# Patient Record
Sex: Male | Born: 1955 | Race: Black or African American | Hispanic: No | Smoking: Never smoker
Health system: Southern US, Community
[De-identification: ages and names within clinical notes are randomized; demographics above are authoritative.]

## PROBLEM LIST (undated history)

## (undated) DIAGNOSIS — N400 Enlarged prostate without lower urinary tract symptoms: Secondary | ICD-10-CM

## (undated) DIAGNOSIS — Z8639 Personal history of other endocrine, nutritional and metabolic disease: Secondary | ICD-10-CM

## (undated) DIAGNOSIS — I10 Essential (primary) hypertension: Secondary | ICD-10-CM

## (undated) DIAGNOSIS — R740 Nonspecific elevation of levels of transaminase and lactic acid dehydrogenase [LDH]: Secondary | ICD-10-CM

## (undated) DIAGNOSIS — Z125 Encounter for screening for malignant neoplasm of prostate: Secondary | ICD-10-CM

## (undated) DIAGNOSIS — E785 Hyperlipidemia, unspecified: Secondary | ICD-10-CM

## (undated) DIAGNOSIS — E291 Testicular hypofunction: Secondary | ICD-10-CM

## (undated) DIAGNOSIS — K117 Disturbances of salivary secretion: Secondary | ICD-10-CM

## (undated) DIAGNOSIS — I8390 Asymptomatic varicose veins of unspecified lower extremity: Secondary | ICD-10-CM

## (undated) DIAGNOSIS — R1310 Dysphagia, unspecified: Secondary | ICD-10-CM

## (undated) DIAGNOSIS — R0789 Other chest pain: Secondary | ICD-10-CM

## (undated) DIAGNOSIS — M1990 Unspecified osteoarthritis, unspecified site: Secondary | ICD-10-CM

## (undated) HISTORY — DX: Encounter for screening for malignant neoplasm of prostate: Z12.5

## (undated) HISTORY — DX: Dysphagia, unspecified: R13.10

## (undated) HISTORY — DX: Hyperlipidemia, unspecified: E78.5

## (undated) HISTORY — DX: Essential (primary) hypertension: I10

## (undated) HISTORY — DX: Disturbances of salivary secretion: K11.7

## (undated) HISTORY — DX: Asymptomatic varicose veins of unspecified lower extremity: I83.90

## (undated) HISTORY — DX: Nonspecific elevation of levels of transaminase and lactic acid dehydrogenase (ldh): R74.0

## (undated) HISTORY — DX: Testicular hypofunction: E29.1

## (undated) HISTORY — DX: Personal history of other endocrine, nutritional and metabolic disease: Z86.39

## (undated) HISTORY — DX: Benign prostatic hyperplasia without lower urinary tract symptoms: N40.0

## (undated) HISTORY — DX: Other chest pain: R07.89

## (undated) HISTORY — PX: COLONOSCOPY: SHX174

## (undated) HISTORY — PX: OTHER SURGICAL HISTORY: SHX169

---

## 2004-07-08 ENCOUNTER — Ambulatory Visit (HOSPITAL_COMMUNITY): Admission: RE | Admit: 2004-07-08 | Discharge: 2004-07-08 | Payer: Self-pay | Admitting: Surgery

## 2004-07-26 HISTORY — PX: HERNIA REPAIR: SHX51

## 2005-01-19 ENCOUNTER — Ambulatory Visit: Payer: Self-pay | Admitting: Internal Medicine

## 2005-02-03 ENCOUNTER — Ambulatory Visit: Payer: Self-pay | Admitting: Internal Medicine

## 2008-06-06 ENCOUNTER — Ambulatory Visit (HOSPITAL_COMMUNITY): Admission: RE | Admit: 2008-06-06 | Discharge: 2008-06-06 | Payer: Self-pay | Admitting: Otolaryngology

## 2008-11-19 ENCOUNTER — Encounter: Admission: RE | Admit: 2008-11-19 | Discharge: 2008-11-19 | Payer: Self-pay | Admitting: Otolaryngology

## 2011-10-15 ENCOUNTER — Ambulatory Visit: Payer: BC Managed Care – PPO

## 2011-10-15 ENCOUNTER — Ambulatory Visit (INDEPENDENT_AMBULATORY_CARE_PROVIDER_SITE_OTHER): Payer: BC Managed Care – PPO | Admitting: Family Medicine

## 2011-10-15 VITALS — BP 118/70 | HR 52 | Temp 97.6°F | Resp 16 | Ht 65.0 in | Wt 168.0 lb

## 2011-10-15 DIAGNOSIS — M25559 Pain in unspecified hip: Secondary | ICD-10-CM

## 2011-10-15 DIAGNOSIS — M76899 Other specified enthesopathies of unspecified lower limb, excluding foot: Secondary | ICD-10-CM

## 2011-10-15 DIAGNOSIS — M7060 Trochanteric bursitis, unspecified hip: Secondary | ICD-10-CM

## 2011-10-15 MED ORDER — METHYLPREDNISOLONE ACETATE 80 MG/ML IJ SUSP: 40.0000 mg | Freq: Once | INTRAMUSCULAR | Status: DC

## 2011-10-15 NOTE — Progress Notes (Signed)
  Patient Name: Edward Glass Date of Birth: March 04, 1956 Medical Record Number: [id number] Gender: male Date of Encounter: 10/15/2011  History of Present Illness:  Edward Glass is a 56 y.o. very pleasant male patient who presents with the following:  Has noted right hip pain for about 2 weeks.  Unsure if it might be related to exercise.  Insidious onset.  Doing squats is most painful.  He is a Systems analyst and exercises a lot.  Has had a similar problem in the past but it self resolved.   Generally healthy.  No history of diabetes  There is no problem list on file for this patient.  No past medical history on file. No past surgical history on file. History  Substance Use Topics  . Smoking status: Never Smoker   . Smokeless tobacco: Not on file  . Alcohol Use: Not on file   No family history on file. No Known Allergies  Medication list has been reviewed and updated.  Review of Systems: As per HPI- otherwise negative.Marland Kitchen  Physical Examination: Filed Vitals:   10/15/11 0902  BP: 118/70  Pulse: 52  Temp: 97.6 F (36.4 C)  TempSrc: Oral  Resp: 16  Height: 5\' 5"  (1.651 m)  Weight: 168 lb (76.204 kg)    Body mass index is 27.96 kg/(m^2).   GEN: WDWN, NAD, Non-toxic, Alert & Oriented x 3- fit build HEENT: Atraumatic, Normocephalic.  Ears and Nose: No external deformity. EXTR: No clubbing/cyanosis/edema NEURO: Normal gait.  PSYCH: Normally interactive. Conversant. Not depressed or anxious appearing.  Calm demeanor.  Right hip: tender over greater trochanter and surrounding muscles. No redness, heat or swelling.  Mild tenderness also over GT with hip movement.  Full leg strength and sensation [facility] reading (PRIMARY) by  Dr. Patsy Lager.  Normal right hip   Procedure note: explained suspected diagnosis of trochanteric bursitis to patinet.  He would like to try an injection.  Explained risk of infection, bleeding, pain, etc- he gave consent.  Located area of tenderness  over right greater trochanter.  Cleaned area with betadine X 2 , alcohol X3, sprayed with ethyl chloride for anesthesia and injected with 3cc of 1% lidocaine and 40mg  of depo- medrol.  He tolerated the procedure well, no complications Assessment and Plan: 1. Hip pain  DG Hip Complete Right, methylPREDNISolone acetate (DEPO-MEDROL) injection 40 mg  2. Trochanteric bursitis     Injected as above.  Let us know if not helpful- Sooner if worse.

## 2012-02-07 DIAGNOSIS — I8390 Asymptomatic varicose veins of unspecified lower extremity: Secondary | ICD-10-CM

## 2012-02-07 HISTORY — DX: Asymptomatic varicose veins of unspecified lower extremity: I83.90

## 2012-05-10 ENCOUNTER — Ambulatory Visit (INDEPENDENT_AMBULATORY_CARE_PROVIDER_SITE_OTHER): Payer: BC Managed Care – PPO | Admitting: Family Medicine

## 2012-05-10 DIAGNOSIS — Z23 Encounter for immunization: Secondary | ICD-10-CM

## 2013-05-09 ENCOUNTER — Ambulatory Visit (INDEPENDENT_AMBULATORY_CARE_PROVIDER_SITE_OTHER): Payer: BC Managed Care – PPO | Admitting: Family Medicine

## 2013-05-09 DIAGNOSIS — Z23 Encounter for immunization: Secondary | ICD-10-CM

## 2013-08-10 ENCOUNTER — Encounter: Payer: Self-pay | Admitting: Internal Medicine

## 2013-08-10 ENCOUNTER — Ambulatory Visit (INDEPENDENT_AMBULATORY_CARE_PROVIDER_SITE_OTHER): Payer: BC Managed Care – PPO | Admitting: Internal Medicine

## 2013-08-10 VITALS — BP 122/88 | HR 61 | Ht 65.0 in | Wt 168.0 lb

## 2013-08-10 DIAGNOSIS — R0789 Other chest pain: Secondary | ICD-10-CM

## 2013-08-10 DIAGNOSIS — Z79899 Other long term (current) drug therapy: Secondary | ICD-10-CM

## 2013-08-10 DIAGNOSIS — Z13220 Encounter for screening for lipoid disorders: Secondary | ICD-10-CM

## 2013-08-10 DIAGNOSIS — Z136 Encounter for screening for cardiovascular disorders: Secondary | ICD-10-CM

## 2013-08-10 HISTORY — DX: Other chest pain: R07.89

## 2013-08-10 NOTE — Progress Notes (Signed)
    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  He has a long-standing history of body building over 30 years and has been a steroid user in the past. He denies using that currently however based on some of the heart problems that he's trainees have, he thought that he should get evaluated for his heart. He does not have any history of known cardiac disease, diabetes, dyslipidemia or other risk factors. There is no significant heart disease in his family. As mentioned he did have a history of anabolic steroid use in the past. He continues to lift weights and does aerobic exercise 4-5 times a week, for almost 60 minutes without any limitation such as chest pain or shortness of breath.  He occasionally gets some twinging in the left chest which goes away after a few minutes.  This does not sound particularly cardiac.  PMHx:  History reviewed. No pertinent past medical history.  History reviewed. No pertinent past surgical history.  FAMHx:  No family history on file.  SOCHx:   reports that he has never smoked. He has never used smokeless tobacco. He reports that he does not drink alcohol or use illicit drugs.  ALLERGIES:  No Known Allergies  ROS: A comprehensive review of systems was negative except for: Cardiovascular: positive for chest pain  HOME MEDS: No current outpatient prescriptions on file.   Current Facility-Administered Medications  Medication Dose Route Frequency Provider Last Rate Last Dose  . methylPREDNISolone acetate (DEPO-MEDROL) injection 40 mg  40 mg Intra-articular Once Darreld Mclean, MD        LABS/IMAGING: No results found for this or any previous visit (from the past 48 hour(s)). No results found.  VITALS: BP 122/88  Pulse 61   Ht 5\' 5"  (1.651 m)  Wt 168 lb (76.204 kg)  BMI 27.96 kg/m2  EXAM: General appearance: alert and no distress, muscular, well-developed Neck: no carotid bruit and no JVD Lungs: clear to auscultation bilaterally Heart: regular rate and rhythm, S1, S2 normal, no murmur, click, rub or gallop Abdomen: soft, non-tender; bowel sounds normal; no masses,  no organomegaly Extremities: extremities normal, atraumatic, no cyanosis or edema Pulses: 2+ and symmetric Skin: Skin color, texture, turgor normal. No rashes or lesions Neurologic: Grossly normal Psych: Normal  EKG: Normal sinus rhythm, minimal criteria for LVH  ASSESSMENT: 1. Atypical chest pain 2. History of anabolic steroid use  PLAN: 1.   [name] did describe a small amount of atypical type chest pain. However he is able to exercise quite high metabolic rate without any symptoms. I did not leave any further functional testing is necessary. He is concerned about whether his prior anabolic steroid use and supplements may have put him at risk for cardiovascular disease.  I think he would be a good candidate for coronary calcium scoring given his age and intermediate risk.  I would also like to check a lipid profile. I can make further recommendations based on these findings.  Pixie Casino, MD, [facility] Attending Cardiologist [facility]  HILTY,Kenneth C 08/10/2013, 4:12 PM

## 2013-08-10 NOTE — Patient Instructions (Signed)
Dr. Debara Pickett has ordered a coronary calcium score - this is done at [facility] will help set this up.   Your physician recommends that you return for lab work in a few days to a week. You will need to be fasting.   Your physician recommends that you schedule a follow-up appointment in a few weeks.

## 2013-08-13 ENCOUNTER — Encounter: Payer: Self-pay | Admitting: Internal Medicine

## 2013-08-14 ENCOUNTER — Ambulatory Visit (INDEPENDENT_AMBULATORY_CARE_PROVIDER_SITE_OTHER)
Admission: RE | Admit: 2013-08-14 | Discharge: 2013-08-14 | Disposition: A | Discharge status: Home / Self Care | Payer: Self-pay | Source: Ambulatory Visit | Attending: Internal Medicine | Admitting: Internal Medicine

## 2013-08-14 DIAGNOSIS — Z136 Encounter for screening for cardiovascular disorders: Secondary | ICD-10-CM

## 2013-08-15 LAB — COMPREHENSIVE METABOLIC PANEL
ALT: 30 U/L (ref 0–53)
AST: 47 U/L — ABNORMAL HIGH (ref 0–37)
Albumin: 4.3 g/dL (ref 3.5–5.2)
Alkaline Phosphatase: 68 U/L (ref 39–117)
BUN: 26 mg/dL — ABNORMAL HIGH (ref 6–23)
CHLORIDE: 106 meq/L (ref 96–112)
CO2: 28 meq/L (ref 19–32)
Calcium: 9.4 mg/dL (ref 8.4–10.5)
Creat: 1.03 mg/dL (ref 0.50–1.35)
GLUCOSE: 82 mg/dL (ref 70–99)
Potassium: 4.6 mEq/L (ref 3.5–5.3)
SODIUM: 139 meq/L (ref 135–145)
TOTAL PROTEIN: 6.8 g/dL (ref 6.0–8.3)
Total Bilirubin: 0.7 mg/dL (ref 0.3–1.2)

## 2013-08-16 LAB — NMR LIPOPROFILE WITH LIPIDS
Cholesterol, Total: 159 mg/dL (ref <200)
HDL Particle Number: 34.4 umol/L (ref >=30.5)
HDL SIZE: 9.5 nm (ref >=9.2)
HDL-C: 63 mg/dL (ref >=40)
LARGE HDL: 13.3 umol/L (ref >=4.8)
LARGE VLDL-P: 2 nmol/L (ref <=2.7)
LDL (calc): 86 mg/dL (ref <100)
LDL Particle Number: 1063 nmol/L — ABNORMAL HIGH (ref <1000)
LDL Size: 20.8 nm (ref >20.5)
LP-IR SCORE: 29 (ref <=45)
SMALL LDL PARTICLE NUMBER: 416 nmol/L (ref <=527)
TRIGLYCERIDES: 52 mg/dL (ref <150)
VLDL SIZE: 42.5 nm (ref <=46.6)

## 2013-08-20 ENCOUNTER — Encounter: Payer: Self-pay | Admitting: *Deleted

## 2013-10-03 ENCOUNTER — Ambulatory Visit (INDEPENDENT_AMBULATORY_CARE_PROVIDER_SITE_OTHER): Payer: BC Managed Care – PPO | Admitting: Family Medicine

## 2013-10-03 VITALS — BP 116/70 | HR 58 | Temp 98.2°F | Resp 18 | Ht 65.0 in | Wt 167.0 lb

## 2013-10-03 DIAGNOSIS — N138 Other obstructive and reflux uropathy: Secondary | ICD-10-CM

## 2013-10-03 DIAGNOSIS — R300 Dysuria: Secondary | ICD-10-CM

## 2013-10-03 DIAGNOSIS — N401 Enlarged prostate with lower urinary tract symptoms: Secondary | ICD-10-CM

## 2013-10-03 LAB — POCT URINALYSIS DIPSTICK
Bilirubin, UA: NEGATIVE
Glucose, UA: NEGATIVE
Ketones, UA: NEGATIVE
Leukocytes, UA: NEGATIVE
Nitrite, UA: NEGATIVE
Protein, UA: NEGATIVE
Spec Grav, UA: 1.02
Urobilinogen, UA: 0.2
pH, UA: 5.5

## 2013-10-03 LAB — POCT UA - MICROSCOPIC ONLY
Bacteria, U Microscopic: NEGATIVE
Casts, Ur, LPF, POC: NEGATIVE
Crystals, Ur, HPF, POC: NEGATIVE
Epithelial cells, urine per micros: NEGATIVE
Mucus, UA: NEGATIVE
Yeast, UA: NEGATIVE

## 2013-10-03 LAB — IFOBT (OCCULT BLOOD): IFOBT: POSITIVE

## 2013-10-03 MED ORDER — AZITHROMYCIN 250 MG PO TABS: ORAL_TABLET | ORAL | Status: DC

## 2013-10-03 NOTE — Patient Instructions (Signed)
Take azithromycin 250 mg 4 pills at the same time today.  I will let you know in the next couple of days the results of your culture. If you do not hear from [place] by Saturday please call and see if that result is come back.  Drink plenty of fluids  If having more pain or discharge please return  I would advise that you consider seeing your gastroenterologist for your 10 year colonoscopy even though it has only been a little under 9 years. He did have a trace amount of blood detectable in the stool on the rectal exam. This could have been from your hemorrhoids, but we cannot know for certain.   The prostate gland was large in size but normal in feel. Please have your primary care physician check that he next physical exam.

## 2013-10-03 NOTE — Progress Notes (Signed)
Subjective: 58 year old man who presents with a history of about one week of urinary discomfort and burning. He thinks it has gradually gotten a little bit worse. He has not had urinary tract infection or STD problems in the past. He does have a history of having had multiple sexual partners. His last sexual encounter was earlier this week. He's not been having a fever. No nausea or vomiting. No diarrhea or constipation. No history of prostate problems. He had a physical exam last June and was told things were normal. He is a Insurance underwriter. His only medication is taking some viagra.   Objective: Healthy-appearing muscular man in no major acute distress. His abdomen is soft without masses or CVA tenderness. Normal male external genitalia. There is a tiny drop of urine in the tip of the urethra but no pus is noted. Urethral probe swab taken. Digital rectal exam was done. Prostate gland feels moderately large, soft, no masses.  Assessment:  Dysuria BPH  Plan: Urinalysis, culture of urethra,Hemasure Results for orders placed in visit on 10/03/13  POCT UA - MICROSCOPIC ONLY      Result Value Ref Range   WBC, Ur, HPF, POC 0-1     RBC, urine, microscopic 0-1     Bacteria, U Microscopic neg     Mucus, UA neg     Epithelial cells, urine per micros neg     Crystals, Ur, HPF, POC neg     Casts, Ur, LPF, POC neg     Yeast, UA neg    POCT URINALYSIS DIPSTICK      Result Value Ref Range   Color, UA yellow     Clarity, UA clear     Glucose, UA neg     Bilirubin, UA neg     Ketones, UA neg     Spec Grav, UA 1.020     Blood, UA tr-intact     pH, UA 5.5     Protein, UA neg     Urobilinogen, UA 0.2     Nitrite, UA neg     Leukocytes, UA Negative    IFOBT (OCCULT BLOOD)      Result Value Ref Range   IFOBT Positive     Azithromycin 1 gm po.  Rtc if not improving

## 2013-10-04 LAB — GC/CHLAMYDIA PROBE AMP
CT PROBE, AMP APTIMA: NEGATIVE
GC PROBE AMP APTIMA: NEGATIVE

## 2014-05-08 ENCOUNTER — Ambulatory Visit (INDEPENDENT_AMBULATORY_CARE_PROVIDER_SITE_OTHER): Payer: BC Managed Care – PPO | Admitting: Family Medicine

## 2014-05-08 DIAGNOSIS — Z23 Encounter for immunization: Secondary | ICD-10-CM

## 2014-07-01 ENCOUNTER — Ambulatory Visit (INDEPENDENT_AMBULATORY_CARE_PROVIDER_SITE_OTHER): Payer: BC Managed Care – PPO | Admitting: Family Medicine

## 2014-07-01 ENCOUNTER — Ambulatory Visit (INDEPENDENT_AMBULATORY_CARE_PROVIDER_SITE_OTHER): Payer: BC Managed Care – PPO

## 2014-07-01 VITALS — BP 122/72 | HR 59 | Temp 97.8°F | Resp 18 | Ht 64.25 in | Wt 170.0 lb

## 2014-07-01 DIAGNOSIS — M25551 Pain in right hip: Secondary | ICD-10-CM

## 2014-07-01 DIAGNOSIS — M7061 Trochanteric bursitis, right hip: Secondary | ICD-10-CM

## 2014-07-01 MED ORDER — TRIAMCINOLONE ACETONIDE 40 MG/ML IJ SUSP
40.0000 mg | Freq: Once | INTRAMUSCULAR | Status: AC
  Administered 2014-07-01: 40 mg via INTRAMUSCULAR

## 2014-07-01 NOTE — Progress Notes (Signed)
Chief Complaint:  Chief Complaint  Patient presents with  . Hip Pain    Right hip pain. 3-4 months. Started out as a sharp pain but is now a dull pain.     HPI: Edward Glass is a 58 y.o. male who is here for right hip pain for the last 3-4 months, he has had prior hip pain and injury. He has had great troch bursitis in the past and was given depomedrol and had relief  . He states that certain movements ie the elliptical would aggravate the paina and it would radiate around his hip joint. 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 He states he doe snot compete or do that any longer.  No prior hx of AVN  History reviewed. No pertinent past medical history. History reviewed. No pertinent past surgical history. History   Social History  . Marital Status: Married    Spouse Name: N/A    Number of Children: N/A  . Years of Education: N/A   Social History Main Topics  . Smoking status: Never Smoker   . Smokeless tobacco: Never Used  . Alcohol Use: No  . Drug Use: No  . Sexual Activity: None   Other Topics Concern  . None   Social History Narrative   Family History  Problem Relation Age of Onset  . Kidney failure Mother   . Cancer Mother   . Prostate cancer Father   . Cancer Father   . Diabetes Father    No Known Allergies Prior to Admission medications   Medication Sig Start Date End Date Taking? Authorizing Provider  aspirin 81 MG tablet Take 81 mg by mouth daily.    Historical Provider, MD     ROS: The patient denies fevers, chills, night sweats, unintentional weight loss, chest pain, palpitations, wheezing, dyspnea on exertion, nausea, vomiting, abdominal pain, dysuria, hematuria, melena, numbness, weakness, or tingling.  All other systems have been reviewed and were otherwise negative with the exception of those mentioned in the HPI and as above.    PHYSICAL EXAM: Filed Vitals:   07/01/14 1454    BP: 122/72  Pulse: 59  Temp: 97.8 F (36.6 C)  Resp: 18   Filed Vitals:   07/01/14 1454  Height: 5' 4.25" (1.632 m)  Weight: 170 lb (77.111 kg)   Body mass index is 28.95 kg/(m^2).  General: Alert, no acute distress HEENT:  Normocephalic, atraumatic, oropharynx patent. EOMI, PERRLA Cardiovascular:  Regular rate and rhythm, no rubs murmurs or gallops.  No Carotid bruits, radial pulse intact. No pedal edema.  Respiratory: Clear to auscultation bilaterally.  No wheezes, rales, or rhonchi.  No cyanosis, no use of accessory musculature GI: No organomegaly, abdomen is soft and non-tender, positive bowel sounds.  No masses. Skin: No rashes. Neurologic: Facial musculature symmetric. Psychiatric: Patient is appropriate throughout our interaction. Lymphatic: No cervical lymphadenopathy Musculoskeletal: Gait intact. lumbar spine normal Right hip-tender at greater troch 5/5 strength, 2/2 DTRs Neg straight leg No saddle anesthesia   LABS:  EKG/XRAY:   Primary read interpreted by Dr. Marin Comment at [facility]. Neg for fx or dislocation   ASSESSMENT/PLAN: Encounter Diagnoses  Name Primary?  . Right hip pain Yes  . Trochanteric bursitis of right hip    No skin erythema or wound noted. Risks (including but not limited to bleeding and infection), benefits, and alternatives discussed for Right great trochanteric bursitis injection. Verbal consent obtained  after any questions were answered., and verbal understanding expressed. Landmarks noted, and marked as needed. Area cleansed with Betadine x3,  followed by alcohol swab. 25 gauge needle used. Patient was injected with 1 ml of plain lidocaine and  79ml of kenalog 40 mg . He tolerated the procedure well, no blood loss    Gross sideeffects, risk and benefits, and alternatives of medications d/w patient. Patient is aware that all medications have potential sideeffects and we are unable to predict every sideeffect or drug-drug interaction that may  occur.  Blair Lundeen, [place], DO 07/01/2014 4:10 PM

## 2014-07-01 NOTE — Patient Instructions (Signed)
Trochanteric Bursitis You have hip pain due to trochanteric bursitis. Bursitis means that the sack near the outside of the hip is filled with fluid and inflamed. This sack is made up of protective soft tissue. The pain from trochanteric bursitis can be severe and keep you from sleep. It can radiate to the buttocks or down the outside of the thigh to the knee. The pain is almost always worse when rising from the seated or lying position and with walking. Pain can improve after you take a few steps. It happens more often in people with hip joint and lumbar spine problems, such as arthritis or previous surgery. Very rarely the trochanteric bursa can become infected, and antibiotics and/or surgery may be needed. Treatment often includes an injection of local anesthetic mixed with cortisone medicine. This medicine is injected into the area where it is most tender over the hip. Repeat injections may be necessary if the response to treatment is slow. You can apply ice packs over the tender area for 30 minutes every 2 hours for the next few days. Anti-inflammatory and/or narcotic pain medicine may also be helpful. Limit your activity for the next few days if the pain continues. See your caregiver in 5-10 days if you are not greatly improved.  SEEK IMMEDIATE MEDICAL CARE IF:  You develop severe pain, fever, or increased redness.  You have pain that radiates below the knee. EXERCISES STRETCHING EXERCISES - Trochanteric Bursitis  These exercises may help you when beginning to rehabilitate your injury. Your symptoms may resolve with or without further involvement from your physician, physical therapist, or athletic trainer. While completing these exercises, remember:   Restoring tissue flexibility helps normal motion to return to the joints. This allows healthier, less painful movement and activity.  An effective stretch should be held for at least 30 seconds.  A stretch should never be painful. You should only  feel a gentle lengthening or release in the stretched tissue. STRETCH - Iliotibial Band  On the floor or bed, lie on your side so your injured leg is on top. Bend your knee and grab your ankle.  Slowly bring your knee back so that your thigh is in line with your trunk. Keep your heel at your buttocks and gently arch your back so your head, shoulders and hips line up.  Slowly lower your leg so that your knee approaches the floor/bed until you feel a gentle stretch on the outside of your thigh. If you do not feel a stretch and your knee will not fall farther, place the heel of your opposite foot on top of your knee and pull your thigh down farther.  Hold this stretch for __________ seconds.  Repeat __________ times. Complete this exercise __________ times per day. STRETCH - Hamstrings, Supine   Lie on your back. Loop a belt or towel over the ball of your foot as shown.  Straighten your knee and slowly pull on the belt to raise your injured leg. Do not allow the knee to bend. Keep your opposite leg flat on the floor.  Raise the leg until you feel a gentle stretch behind your knee or thigh. Hold this position for __________ seconds.  Repeat __________ times. Complete this stretch __________ times per day. STRETCH - Quadriceps, Prone   Lie on your stomach on a firm surface, such as a bed or padded floor.  Bend your knee and grasp your ankle. If you are unable to reach your ankle or pant leg, use a belt   around your foot to lengthen your reach.  Gently pull your heel toward your buttocks. Your knee should not slide out to the side. You should feel a stretch in the front of your thigh and/or knee.  Hold this position for __________ seconds.  Repeat __________ times. Complete this stretch __________ times per day. STRETCHING - Hip Flexors, Lunge Half kneel with your knee on the floor and your opposite knee bent and directly over your ankle.  Keep good posture with your head over your  shoulders. Tighten your buttocks to point your tailbone downward; this will prevent your back from arching too much.  You should feel a gentle stretch in the front of your thigh and/or hip. If you do not feel any resistance, slightly slide your opposite foot forward and then slowly lunge forward so your knee once again lines up over your ankle. Be sure your tailbone remains pointed downward.  Hold this stretch for __________ seconds.  Repeat __________ times. Complete this stretch __________ times per day. STRETCH - Adductors, Lunge  While standing, spread your legs.  Lean away from your injured leg by bending your opposite knee. You may rest your hands on your thigh for balance.  You should feel a stretch in your inner thigh. Hold for __________ seconds.  Repeat __________ times. Complete this exercise __________ times per day. Document Released: 08/19/2004 Document Revised: 11/26/2013 Document Reviewed: 10/24/2008 ExitCare Patient Information 2015 [employer]. This information is not intended to replace advice given to you by your health care provider. Make sure you discuss any questions you have with your health care provider.  

## 2014-07-01 NOTE — Progress Notes (Deleted)
Chief Complaint:  Chief Complaint  Patient presents with  . Hip Pain    Right hip pain. 3-4 months. Started out as a sharp pain but is now a dull pain.     HPI: Edward Glass is a 58 y.o. male who is here for right hip pain for the last 3-4 month, he has had prior hip pain and injury. He has had great troch bursitis.  He   History reviewed. No pertinent past medical history. History reviewed. No pertinent past surgical history. History   Social History  . Marital Status: Married    Spouse Name: N/A    Number of Children: N/A  . Years of Education: N/A   Social History Main Topics  . Smoking status: Never Smoker   . Smokeless tobacco: Never Used  . Alcohol Use: No  . Drug Use: No  . Sexual Activity: None   Other Topics Concern  . None   Social History Narrative   Family History  Problem Relation Age of Onset  . Kidney failure Mother   . Cancer Mother   . Prostate cancer Father   . Cancer Father   . Diabetes Father    No Known Allergies Prior to Admission medications   Medication Sig Start Date End Date Taking? Authorizing Provider  aspirin 81 MG tablet Take 81 mg by mouth daily.    Historical Provider, MD     ROS: The patient denies fevers, chills, night sweats, unintentional weight loss, chest pain, palpitations, wheezing, dyspnea on exertion, nausea, vomiting, abdominal pain, dysuria, hematuria, melena, numbness, weakness, or tingling. ***  All other systems have been reviewed and were otherwise negative with the exception of those mentioned in the HPI and as above.    PHYSICAL EXAM: Filed Vitals:   07/01/14 1454  BP: 122/72  Pulse: 59  Temp: 97.8 F (36.6 C)  Resp: 18   Filed Vitals:   07/01/14 1454  Height: 5' 4.25" (1.632 m)  Weight: 170 lb (77.111 kg)   Body mass index is 28.95 kg/(m^2).  General: Alert, no acute distress HEENT:  Normocephalic, atraumatic, oropharynx patent. EOMI, PERRLA Cardiovascular:  Regular rate and rhythm,  no rubs murmurs or gallops.  No Carotid bruits, radial pulse intact. No pedal edema.  Respiratory: Clear to auscultation bilaterally.  No wheezes, rales, or rhonchi.  No cyanosis, no use of accessory musculature GI: No organomegaly, abdomen is soft and non-tender, positive bowel sounds.  No masses. Skin: No rashes. Neurologic: Facial musculature symmetric. Psychiatric: Patient is appropriate throughout our interaction. Lymphatic: No cervical lymphadenopathy Musculoskeletal: Gait intact.   LABS: Results for orders placed or performed in visit on 10/03/13  GC/Chlamydia Probe Amp  Result Value Ref Range   CT Probe RNA NEGATIVE    GC Probe RNA NEGATIVE   POCT UA - Microscopic Only  Result Value Ref Range   WBC, Ur, HPF, POC 0-1    RBC, urine, microscopic 0-1    Bacteria, U Microscopic neg    Mucus, UA neg    Epithelial cells, urine per micros neg    Crystals, Ur, HPF, POC neg    Casts, Ur, LPF, POC neg    Yeast, UA neg   POCT urinalysis dipstick  Result Value Ref Range   Color, UA yellow    Clarity, UA clear    Glucose, UA neg    Bilirubin, UA neg    Ketones, UA neg    Spec Grav, UA 1.020  Blood, UA tr-intact    pH, UA 5.5    Protein, UA neg    Urobilinogen, UA 0.2    Nitrite, UA neg    Leukocytes, UA Negative   IFOBT POC (occult bld, rslt in office)  Result Value Ref Range   IFOBT Positive      EKG/XRAY:   Primary read interpreted by Dr. Marin Comment at [facility].   ASSESSMENT/PLAN: No diagnosis found.   Gross sideeffects, risk and benefits, and alternatives of medications d/w patient. Patient is aware that all medications have potential sideeffects and we are unable to predict every sideeffect or drug-drug interaction that may occur.  Sophia Sperry, Rafael Gonzalez, DO 07/01/2014 3:26 PM

## 2014-07-26 DIAGNOSIS — Z86010 Personal history of colonic polyps: Secondary | ICD-10-CM

## 2014-07-26 DIAGNOSIS — Z860101 Personal history of adenomatous and serrated colon polyps: Secondary | ICD-10-CM

## 2014-07-26 HISTORY — DX: Personal history of adenomatous and serrated colon polyps: Z86.0101

## 2014-07-26 HISTORY — DX: Personal history of colonic polyps: Z86.010

## 2014-08-21 ENCOUNTER — Encounter: Payer: Self-pay | Admitting: Endocrinology

## 2014-08-21 ENCOUNTER — Ambulatory Visit (INDEPENDENT_AMBULATORY_CARE_PROVIDER_SITE_OTHER): Payer: Self-pay | Admitting: Endocrinology

## 2014-08-21 VITALS — BP 132/74 | HR 57 | Temp 98.3°F | Ht 64.0 in | Wt 173.0 lb

## 2014-08-21 DIAGNOSIS — E291 Testicular hypofunction: Secondary | ICD-10-CM

## 2014-08-21 DIAGNOSIS — Z125 Encounter for screening for malignant neoplasm of prostate: Secondary | ICD-10-CM

## 2014-08-21 HISTORY — DX: Encounter for screening for malignant neoplasm of prostate: Z12.5

## 2014-08-21 HISTORY — DX: Testicular hypofunction: E29.1

## 2014-08-21 LAB — FOLLICLE STIMULATING HORMONE: FSH: 9.1 m[IU]/mL (ref 1.4–18.1)

## 2014-08-21 LAB — TSH: TSH: 2.09 u[IU]/mL (ref 0.35–4.50)

## 2014-08-21 LAB — LUTEINIZING HORMONE: LH: 6.76 m[IU]/mL (ref 1.50–9.30)

## 2014-08-21 LAB — PSA: PSA: 2.12 ng/mL (ref 0.10–4.00)

## 2014-08-21 MED ORDER — SILDENAFIL CITRATE 20 MG PO TABS: ORAL_TABLET | ORAL | Status: DC

## 2014-08-21 NOTE — Progress Notes (Signed)
Subjective:    Patient ID: Edward Glass, male    DOB: [birth year], 59 y.o.   MRN: [id number]  HPI Pt reports he had puberty at the normal age.  He has 2 biological children.  He took illicit androgens x 15 years, many years ago, as a competitive bodybuilder.  He has never been on any prescribed medication for hypogonadism.  He denies any h/o infertility.  He has never had surgery, or a severe injury to the head or genital area.  He has associated moderate ED sxs--viagra helps Pt says he was noted to have a low free testosterone in mid-2015.   No past medical history on file.  No past surgical history on file.  History   Social History  . Marital Status: Married    Spouse Name: N/A    Number of Children: N/A  . Years of Education: N/A   Occupational History  . Not on file.   Social History Main Topics  . Smoking status: Never Smoker   . Smokeless tobacco: Never Used  . Alcohol Use: No  . Drug Use: No  . Sexual Activity: Not on file   Other Topics Concern  . Not on file   Social History Narrative    Current Outpatient Prescriptions on File Prior to Visit  Medication Sig Dispense Refill  . aspirin 81 MG tablet Take 81 mg by mouth daily.     No current facility-administered medications on file prior to visit.    No Known Allergies  Family History  Problem Relation Age of Onset  . Kidney failure Mother   . Cancer Mother   . Prostate cancer Father   . Cancer Father   . Diabetes Father     BP 132/74 mmHg  Pulse 57  Temp(Src) 98.3 F (36.8 C) (Oral)  Ht 5\' 4"  (1.626 m)  Wt 173 lb (78.472 kg)  BMI 29.68 kg/m2  SpO2 98%   Review of Systems denies depression, numbness, weight change, decreased urinary stream, gynecomastia, muscle weakness, fever, headache, easy bruising, sob, rash, blurry vision, rhinorrhea, chest pain.      Objective:   Physical Exam VS: see vs page GEN: no distress HEAD: head: no deformity eyes: no periorbital swelling, no  proptosis external nose and ears are normal mouth: no lesion seen NECK: supple, thyroid is not enlarged CHEST WALL: no deformity LUNGS: clear to auscultation BREASTS:  No gynecomastia CV: reg rate and rhythm, no murmur ABD: abdomen is soft, nontender.  no hepatosplenomegaly.  not distended.  Self-reducing ventral hernia GENITALIA:  Testicles are small and soft; scrotum and penis are normal.   MUSCULOSKELETAL: muscle bulk is grossly and diffusely increased.  no obvious joint swelling.  gait is normal and steady EXTEMITIES: no deformity.  no ulcer on the feet.  feet are of normal color and temp.  no edema PULSES: dorsalis pedis intact bilat.  no carotid bruit NEURO:  cn 2-12 grossly intact.   readily moves all 4's.  sensation is intact to touch on all 4's SKIN:  Normal texture and temperature.  No rash or suspicious lesion is visible.   NODES:  None palpable at the neck.   PSYCH: alert, well-oriented.  Does not appear anxious nor depressed.   Lab Results  Component Value Date   TESTOSTERONE 303* 08/21/2014  FSH, LH, and prolactin are normal    Assessment & Plan:  Hypogonadism, new, mild, due to chronic androgen abuse.  No medication is needed for this.  ED, moderate exacerbation.  Patient is advised the following: Patient Instructions  normalization of testosterone is not known to harm you.  however, there are "theoretical" risks, including increased fertility, hair loss, prostate cancer, benign prostate enlargement, blood clots, liver problems, lower hdl ("good cholesterol"), polycythemia (opposite of anemia), sleep apnea, and behavior changes blood tests are being requested for you today.  We'll let you know about the results.   Here is a prescription for a generic strength of viagra.

## 2014-08-21 NOTE — Patient Instructions (Addendum)
normalization of testosterone is not known to harm you.  however, there are "theoretical" risks, including increased fertility, hair loss, prostate cancer, benign prostate enlargement, blood clots, liver problems, lower hdl ("good cholesterol"), polycythemia (opposite of anemia), sleep apnea, and behavior changes blood tests are being requested for you today.  We'll let you know about the results.   Here is a prescription for a generic strength of viagra.

## 2014-08-22 LAB — TESTOSTERONE,FREE AND TOTAL
TESTOSTERONE FREE: 2.9 pg/mL — AB (ref 7.2–24.0)
TESTOSTERONE: 303 ng/dL — AB (ref 348–1197)

## 2014-08-22 LAB — PROLACTIN: Prolactin: 7.4 ng/mL (ref 2.1–17.1)

## 2014-08-23 ENCOUNTER — Other Ambulatory Visit: Payer: Self-pay | Admitting: Endocrinology

## 2014-08-23 ENCOUNTER — Telehealth: Payer: Self-pay | Admitting: Endocrinology

## 2014-08-23 ENCOUNTER — Encounter: Payer: Self-pay | Admitting: *Deleted

## 2014-08-23 MED ORDER — CLOMIPHENE CITRATE 50 MG PO TABS: ORAL_TABLET | ORAL | Status: DC

## 2014-08-23 NOTE — Telephone Encounter (Signed)
Pt advised of his recent lab work and that his testosterone was low. Clomid 50 mg was sent to pt's pharmacy due to low leveles. Pt voiced understanding

## 2014-08-23 NOTE — Telephone Encounter (Signed)
Patient stated that he his pharmacy called him told him that he has a prescription ready, he stated he didn't know anything about it. He would also like the results of his labs.

## 2014-11-21 ENCOUNTER — Encounter: Payer: Self-pay | Admitting: Internal Medicine

## 2014-11-22 ENCOUNTER — Encounter: Payer: Self-pay | Admitting: Internal Medicine

## 2015-02-26 ENCOUNTER — Encounter: Payer: Self-pay | Admitting: Gastroenterology

## 2015-04-18 ENCOUNTER — Ambulatory Visit (AMBULATORY_SURGERY_CENTER): Payer: Self-pay | Admitting: *Deleted

## 2015-04-18 VITALS — Ht 64.0 in | Wt 170.8 lb

## 2015-04-18 DIAGNOSIS — Z1211 Encounter for screening for malignant neoplasm of colon: Secondary | ICD-10-CM

## 2015-04-18 NOTE — Progress Notes (Signed)
Denies allergies to eggs or soy products. Denies complications with sedation or anesthesia. Denies O2 use. Denies use of diet or weight loss medications.  Emmi instructions given for colonoscopy.  

## 2015-05-02 ENCOUNTER — Encounter: Payer: BLUE CROSS/BLUE SHIELD | Admitting: Gastroenterology

## 2015-06-16 ENCOUNTER — Encounter: Payer: Self-pay | Admitting: Internal Medicine

## 2015-06-16 ENCOUNTER — Ambulatory Visit (INDEPENDENT_AMBULATORY_CARE_PROVIDER_SITE_OTHER): Payer: BLUE CROSS/BLUE SHIELD | Admitting: Internal Medicine

## 2015-06-16 VITALS — BP 120/82 | HR 75 | Ht 65.0 in | Wt 176.0 lb

## 2015-06-16 DIAGNOSIS — R7401 Elevation of levels of liver transaminase levels: Secondary | ICD-10-CM

## 2015-06-16 DIAGNOSIS — Z8639 Personal history of other endocrine, nutritional and metabolic disease: Secondary | ICD-10-CM | POA: Insufficient documentation

## 2015-06-16 DIAGNOSIS — K117 Disturbances of salivary secretion: Secondary | ICD-10-CM

## 2015-06-16 DIAGNOSIS — R0789 Other chest pain: Secondary | ICD-10-CM

## 2015-06-16 DIAGNOSIS — I456 Pre-excitation syndrome: Secondary | ICD-10-CM | POA: Diagnosis not present

## 2015-06-16 DIAGNOSIS — E291 Testicular hypofunction: Secondary | ICD-10-CM | POA: Insufficient documentation

## 2015-06-16 DIAGNOSIS — R002 Palpitations: Secondary | ICD-10-CM | POA: Diagnosis not present

## 2015-06-16 DIAGNOSIS — R740 Nonspecific elevation of levels of transaminase and lactic acid dehydrogenase [LDH]: Secondary | ICD-10-CM

## 2015-06-16 HISTORY — DX: Disturbances of salivary secretion: K11.7

## 2015-06-16 HISTORY — DX: Testicular hypofunction: E29.1

## 2015-06-16 HISTORY — DX: Personal history of other endocrine, nutritional and metabolic disease: Z86.39

## 2015-06-16 HISTORY — DX: Elevation of levels of liver transaminase levels: R74.01

## 2015-06-16 NOTE — Progress Notes (Signed)
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  He has a long-standing history of body building over 30 years and has been a steroid user in the past. He denies using that currently however based on some of the heart problems that he's trainees have, he thought that he should get evaluated for his heart. He does not have any history of known cardiac disease, diabetes, dyslipidemia or other risk factors. There is no significant heart disease in his family. As mentioned he did have a history of anabolic steroid use in the past. He continues to lift weights and does aerobic exercise 4-5 times a week, for almost 60 minutes without any limitation such as chest pain or shortness of breath.  He occasionally gets some twinging in the left chest which goes away after a few minutes.  This does not sound particularly cardiac.  Edward Glass returns today for follow-up. He was previously asked to follow-up as needed as his stress test was negative. He recently tore his left triceps and underwent surgery. Preoperatively he had an EKG which was noted to be markedly abnormal. This demonstrated deep T-wave inversions in V3 through V6 as well as 23 and aVF with a wide QRS and a delayed notched upstroke of the QRS concerning for pre-excitation. Heart rate was 57. He reports he's recently been having palpitations and episodes which may last up to 30 minutes with a rapid heart rate. An EKG in the office today however shows a narrow QRS complex with the similar repolarization changes that were seen in the prior EKG. There is a notched QRS in V4.  PMHx:  Past Medical History  Diagnosis Date  . 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 Atypical chest pain 08/10/2013  . Elevated aspartate aminotransferase  level 06/16/2015  . Excessive salivation 06/16/2015  . H/O nutritional disorder 06/16/2015  . 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 Screening for prostate cancer 08/21/2014  . 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 Hernia repair  2006    FAMHx:  Family History  Problem Relation Age of Onset  . Kidney failure Mother   . Cancer Mother   . Prostate cancer Father   . Cancer Father   . Diabetes Father   . Colon cancer Neg Hx     SOCHx:   reports that he has never smoked. He has never used smokeless tobacco. He reports that he does not drink alcohol or use illicit drugs.  ALLERGIES:  No Known Allergies  ROS: A comprehensive review of systems was negative except for: Cardiovascular: positive for chest pain and palpitations  HOME MEDS: Current Outpatient Prescriptions  Medication Sig Dispense Refill  . aspirin 81 MG tablet Take 81 mg by mouth daily.    . Bisacodyl (DULCOLAX PO) Take by mouth. One time use for colonoscopy    . NON FORMULARY Take 25 mg by mouth daily. T3    . sildenafil (REVATIO) 20 MG tablet 2-5 pills as needed for ED symptoms 50 tablet 5  . Vitamin D, Cholecalciferol, 1000 UNITS TABS Take 1,000 Units by mouth.     No current facility-administered medications for this visit.    LABS/IMAGING: No results found for this or any previous visit (from the  past 48 hour(s)). No results found.  VITALS: BP 120/82 mmHg  Pulse 75  Ht 5\' 5"  (1.651 m)  Wt 176 lb (79.833 kg)  BMI 29.29 kg/m2  EXAM: General appearance: alert and no distress, muscular, well-developed Neck: no carotid bruit and no JVD Lungs: clear to auscultation bilaterally Heart: regular rate and rhythm, S1, S2 normal, no murmur, click, rub or gallop Abdomen: soft, non-tender; bowel sounds normal; no masses,  no organomegaly Extremities: extremities normal, atraumatic, no cyanosis or edema Pulses: 2+ and symmetric Skin: Skin color, texture, turgor  normal. 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 Atypical chest pain - low risk stress test  2. Abnormal EKG consistent with preexcitation/WPW (recent tachy-palpitations) 3. History of anabolic steroid use  PLAN: 1.   Edward Glass recently had preoperative evaluation prior to left triceps repair which was a weightlifting injury. He was found to have an abnormal EKG which likely represents a bypass pathway and preexcitation. He reports that he's been having some tachypalpitations which may last up to about 30 minutes. He notes his heart races but has not felt presyncopal or passed out. We discussed possible management options for WPW including medical therapy or ablation. Given the high risk of success with ablation, I'm inclined to refer him to cardiac electrophysiology, especially since he's been symptomatic for evaluation. He is agreeable to this. Follow-up with me afterwards.  Edward Casino, MD, [facility] Attending Cardiologist Edward Glass 06/16/2015, 5:36 PM

## 2015-06-16 NOTE — Patient Instructions (Signed)
You have been referred to Dr. Allegra Lai ([address])  Your physician recommends that you schedule a follow-up appointment in: 3 months with Dr. Debara Pickett

## 2015-06-17 ENCOUNTER — Encounter: Payer: Self-pay | Admitting: Internal Medicine

## 2015-06-17 ENCOUNTER — Ambulatory Visit (INDEPENDENT_AMBULATORY_CARE_PROVIDER_SITE_OTHER): Payer: BLUE CROSS/BLUE SHIELD | Admitting: Cardiology

## 2015-06-17 ENCOUNTER — Encounter: Payer: Self-pay | Admitting: Cardiology

## 2015-06-17 VITALS — BP 142/98 | HR 72 | Ht 65.0 in | Wt 175.8 lb

## 2015-06-17 DIAGNOSIS — R002 Palpitations: Secondary | ICD-10-CM

## 2015-06-17 NOTE — Progress Notes (Signed)
Thanks for seeing him so quickly!  -Mali

## 2015-06-17 NOTE — Progress Notes (Signed)
Electrophysiology Office Note   Date:  06/17/2015   ID:  Edward, Glass [birth year], MRN [id number]  PCP:  Lurline Hare, MD  Cardiologist:  Debara Pickett Primary Electrophysiologist:  Constance Haw, MD    Chief Complaint  Patient presents with  . Advice Only     History of Present Illness: Edward Glass is a 59 y.o. male who presents today for electrophysiology evaluation.   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 He has a long-standing history of body building over 30 years and has been a steroid user in the past. He denies using that currently however based on some of the heart problems that he's trainees have, he thought that he should get evaluated for his heart.  He recently tore his left triceps and underwent surgery. Preoperatively he had an EKG which was noted to be markedly abnormal. This demonstrated ventricular pre-excitation.  Heart rate was 57. He reports he's recently been having palpitations and episodes which may last up to 30 minutes with a rapid heart rate. He says that at the time of his palpitations, he was using steroids, GH, and T3. He says that since he stopped these he has had no further palpitations other than 2 days after his surgery. He otherwise feels well.  Today, he denies symptoms of palpitations, chest pain, shortness of breath, orthopnea, PND, lower extremity edema, claudication, dizziness, presyncope, syncope, bleeding, or neurologic sequela. The patient is tolerating medications without difficulties and is otherwise without complaint today.    Past Medical History  Diagnosis Date  . 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 Atypical chest pain 08/10/2013  . Elevated aspartate aminotransferase level 06/16/2015  . Excessive salivation 06/16/2015  . H/O nutritional disorder 06/16/2015  . 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 Screening for prostate  cancer 08/21/2014  . 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 Hernia repair  2006     Current Outpatient Prescriptions  Medication Sig Dispense Refill  . aspirin 81 MG tablet Take 81 mg by mouth daily.    . cholecalciferol (VITAMIN D) 1000 UNITS tablet Take 1,000 Units by mouth daily.    . sildenafil (REVATIO) 20 MG tablet 2-5 pills as needed for ED symptoms 50 tablet 5   No current facility-administered medications for this visit.    Allergies:   Review of patient's allergies indicates no known allergies.   Social History:  The patient  reports that he has never smoked. He has never used smokeless tobacco. He reports that he does not drink alcohol or use illicit drugs.   Family History:  The patient's family history includes Cancer in his father and mother; Diabetes in his father; Kidney failure in his mother; Prostate cancer in his father. There is no history of Colon cancer.    ROS:  Please see the history of present illness.  All other systems are reviewed and negative.    PHYSICAL EXAM: VS:  BP 142/98 mmHg  Pulse 72  Ht 5\' 5"  (1.651 m)  Wt 175 lb 12.8 oz (79.742 kg)  BMI 29.25 kg/m2 , BMI Body mass index is 29.25 kg/(m^2). GEN: Well nourished, well developed, in no acute distress HEENT: normal Neck: no JVD, carotid bruits, or masses Cardiac: RRR; no murmurs, rubs, or gallops,no edema  Respiratory:  clear to auscultation bilaterally, normal work of breathing GI: soft, nontender,  nondistended, + BS MS: no deformity or atrophy Skin: warm and dry Neuro:  Strength and sensation are intact Psych: euthymic mood, full affect  EKG:  EKG is ordered today. The ekg ordered today shows sinus rhythm, rate 72, right atrial enlargement, LVH with repolarization abnormalities. ECG done apparently at the time of his surgery shows sinus rhythm rate 57, ventricular preexcitation. Likely anteroseptal pathway  Recent Labs: 08/21/2014: TSH  2.09    Lipid Panel     Component Value Date/Time   CHOL 159 08/15/2013 0818   TRIG 52 08/15/2013 0818   HDL 63 08/15/2013 0818   LDLCALC 86 08/15/2013 0818     Wt Readings from Last 3 Encounters:  06/17/15 175 lb 12.8 oz (79.742 kg)  06/16/15 176 lb (79.833 kg)  04/18/15 170 lb 12.8 oz (77.474 kg)    ASSESSMENT AND PLAN:  1. Palpitations: Patient presenting with palpitations although they have not happened in quite a while. He says that they did stop after he discontinued taking steroids, growth hormone, and T3.  He was referred here due to an abnormal EKG. EKG shows ventricular preexcitation with a possible anteroseptal pathway. His heart rate on the EKG is 57. An EKG done in the office today showed no evidence of preexcitation and a heart rate of 72. It is possible that he does not have a robust pathway and therefore would not be able to sustain tachycardia. Due to that we Edward Glass fit him with a 48 hour monitor to try and determine if he actually does have preexcitation at lower heart rates. He may also benefit from a treadmill exercise test.    Current medicines are reviewed at length with the patient today.   The patient does not have concerns regarding his medicines.  The following changes were made today:  none  Labs/ tests ordered today include: none  No orders of the defined types were placed in this encounter.     Disposition:   FU with Edward Glass pending 48 hour monitor  Signed, Beatriz Settles Meredith Leeds, MD  06/17/2015 11:04 AM     [address]  [address] [phone] (office) [phone] (fax)

## 2015-06-17 NOTE — Patient Instructions (Addendum)
Medication Instructions:  Your physician recommends that you continue on your current medications as directed. Please refer to the Current Medication list given to you today.  Labwork: None ordered  Testing/Procedures: Your physician has recommended that you wear a 48 hour holter monitor. Holter monitors are medical devices that record the heart's electrical activity. Doctors most often use these monitors to diagnose arrhythmias. Arrhythmias are problems with the speed or rhythm of the heartbeat. The monitor is a small, portable device. You can wear one while you do your normal daily activities. This is usually used to diagnose what is causing palpitations/syncope (passing out).  Follow-Up: No follow up is needed at this time with Dr. Curt Bears.  He will see you on an as needed basis, unless holter monitor shows something concerning.   Thank you for choosing [facility]!!   Trinidad Curet, RN [phone]

## 2015-06-18 ENCOUNTER — Ambulatory Visit (INDEPENDENT_AMBULATORY_CARE_PROVIDER_SITE_OTHER): Payer: BLUE CROSS/BLUE SHIELD

## 2015-06-18 DIAGNOSIS — R002 Palpitations: Secondary | ICD-10-CM

## 2015-07-18 ENCOUNTER — Ambulatory Visit (AMBULATORY_SURGERY_CENTER): Payer: BLUE CROSS/BLUE SHIELD | Admitting: Gastroenterology

## 2015-07-18 ENCOUNTER — Encounter: Payer: Self-pay | Admitting: Gastroenterology

## 2015-07-18 VITALS — BP 131/83 | HR 62 | Temp 96.9°F | Resp 17 | Ht 64.0 in | Wt 170.0 lb

## 2015-07-18 DIAGNOSIS — Z1211 Encounter for screening for malignant neoplasm of colon: Secondary | ICD-10-CM

## 2015-07-18 DIAGNOSIS — D122 Benign neoplasm of ascending colon: Secondary | ICD-10-CM

## 2015-07-18 DIAGNOSIS — D120 Benign neoplasm of cecum: Secondary | ICD-10-CM

## 2015-07-18 MED ORDER — SODIUM CHLORIDE 0.9 % IV SOLN
500.0000 mL | INTRAVENOUS | Status: DC
Start: 2015-07-18 — End: 2015-07-18

## 2015-07-18 NOTE — Progress Notes (Signed)
Called to room to assist during endoscopic procedure.  Patient ID and intended procedure confirmed with present staff. Received instructions for my participation in the procedure from the performing physician.  

## 2015-07-18 NOTE — Patient Instructions (Signed)
YOU HAD AN ENDOSCOPIC PROCEDURE TODAY AT THE [facility]:   Refer to the procedure report that was given to you for any specific questions about what was found during the examination.  If the procedure report does not answer your questions, please call your gastroenterologist to clarify.  If you requested that your care partner not be given the details of your procedure findings, then the procedure report has been included in a sealed envelope for you to review at your convenience later.  YOU SHOULD EXPECT: Some feelings of bloating in the abdomen. Passage of more gas than usual.  Walking can help get rid of the air that was put into your GI tract during the procedure and reduce the bloating. If you had a lower endoscopy (such as a colonoscopy or flexible sigmoidoscopy) you may notice spotting of blood in your stool or on the toilet paper. If you underwent a bowel prep for your procedure, you may not have a normal bowel movement for a few days.  Please Note:  You might notice some irritation and congestion in your nose or some drainage.  This is from the oxygen used during your procedure.  There is no need for concern and it should clear up in a day or so.  SYMPTOMS TO REPORT IMMEDIATELY:   Following lower endoscopy (colonoscopy or flexible sigmoidoscopy):  Excessive amounts of blood in the stool  Significant tenderness or worsening of abdominal pains  Swelling of the abdomen that is new, acute  Fever of 100F or higher   For urgent or emergent issues, a gastroenterologist can be reached at any hour by calling [phone].   DIET: Your first meal following the procedure should be a small meal and then it is ok to progress to your normal diet. Heavy or fried foods are harder to digest and may make you feel nauseous or bloated.  Likewise, meals heavy in dairy and vegetables can increase bloating.  Drink plenty of fluids but you should avoid alcoholic beverages for 24  hours.  ACTIVITY:  You should plan to take it easy for the rest of today and you should NOT DRIVE or use heavy machinery until tomorrow (because of the sedation medicines used during the test).    FOLLOW UP: Our staff will call the number listed on your records the next business day following your procedure to check on you and address any questions or concerns that you may have regarding the information given to you following your procedure. If we do not reach you, we will leave a message.  However, if you are feeling well and you are not experiencing any problems, there is no need to return our call.  We will assume that you have returned to your regular daily activities without incident.  If any biopsies were taken you will be contacted by phone or by letter within the next 1-3 weeks.  Please call us at [phone] if you have not heard about the biopsies in 3 weeks.    SIGNATURES/CONFIDENTIALITY: You and/or your care partner have signed paperwork which will be entered into your electronic medical record.  These signatures attest to the fact that that the information above on your After Visit Summary has been reviewed and is understood.  Full responsibility of the confidentiality of this discharge information lies with you and/or your care-partner. 

## 2015-07-18 NOTE — Progress Notes (Signed)
Report to PACU, RN, vss, BBS= Clear.  

## 2015-07-18 NOTE — Op Note (Signed)
[facility]  [address] [place], [address]   COLONOSCOPY PROCEDURE REPORT  PATIENT: Glass, Edward  MR#: [id number] BIRTHDATE: Dec 13, 1955 , 59  yrs. old GENDER: male ENDOSCOPIST: Yetta Flock, MD REFERRED BY: Mikki Harbor MD PROCEDURE DATE:  07/18/2015 PROCEDURE:   Colonoscopy, screening and Colonoscopy with snare polypectomy First Screening Colonoscopy - Avg.  risk and is 50 yrs.  old or older - No.  Prior Negative Screening - Now for repeat screening. 10 or more years since last screening  History of Adenoma - Now for follow-up colonoscopy & has been > or = to 3 yrs.  N/A  Polyps removed today? Yes ASA CLASS:   Class II INDICATIONS:Screening for colonic neoplasia and Colorectal Neoplasm Risk Assessment for this procedure is average risk. MEDICATIONS: Propofol 300 mg IV  DESCRIPTION OF PROCEDURE:   After the risks benefits and alternatives of the procedure were thoroughly explained, informed consent was obtained.  The digital rectal exam revealed no abnormalities of the rectum.   The [id number]  endoscope was introduced through the anus and advanced to the cecum, which was identified by both the appendix and ileocecal valve. No adverse events experienced.   The quality of the prep was adequate  The instrument was then slowly withdrawn as the colon was fully examined. Estimated blood loss is zero unless otherwise noted in this procedure report.   COLON FINDINGS: The bowel preparation was only fair on intubation but cleared with adequate views following lavage.  The colon was extremely tortous.  There was a 51mm sessile polyp in the cecum removed with cold snare.  There was a 7-75mm sessile polyp in the ascending colon removed with cold snare.  The remainder of the colon was normal.  Retroflexed views revealed internal hemorrhoids. The time to cecum = 6.4 Withdrawal time = 21.5   The scope was withdrawn and the procedure  completed. COMPLICATIONS: There were no immediate complications.  ENDOSCOPIC IMPRESSION: 2 colon polyps removed as above Tortous colon Internal hemorrhoids  RECOMMENDATIONS: Await pathology results Resume diet Resume medications No NSAIDS for 2 weeks  eSigned:  Yetta Flock, MD 07/18/2015 11:32 AM   cc:  Mikki Harbor MD, the patient

## 2015-07-22 ENCOUNTER — Telehealth: Payer: Self-pay | Admitting: *Deleted

## 2015-07-22 NOTE — Telephone Encounter (Signed)
  Follow up Call-  Call back number 07/18/2015  Post procedure Call Back phone  # [phone]  Permission to leave phone message Yes     Patient questions:  Do you have a fever, pain , or abdominal swelling? No. Pain Score  0 *  Have you tolerated food without any problems? Yes.    Have you been able to return to your normal activities? Yes.    Do you have any questions about your discharge instructions: Diet   No. Medications  No. Follow up visit  No.  Do you have questions or concerns about your Care? No.  Actions: * If pain score is 4 or above: No action needed, pain <4.

## 2015-07-27 ENCOUNTER — Encounter: Payer: Self-pay | Admitting: Gastroenterology

## 2015-09-22 ENCOUNTER — Encounter: Payer: Self-pay | Admitting: Internal Medicine

## 2015-09-22 ENCOUNTER — Ambulatory Visit (INDEPENDENT_AMBULATORY_CARE_PROVIDER_SITE_OTHER): Payer: BLUE CROSS/BLUE SHIELD | Admitting: Internal Medicine

## 2015-09-22 VITALS — BP 122/82 | HR 60 | Ht 65.0 in | Wt 177.6 lb

## 2015-09-22 DIAGNOSIS — I456 Pre-excitation syndrome: Secondary | ICD-10-CM | POA: Diagnosis not present

## 2015-09-22 DIAGNOSIS — R0789 Other chest pain: Secondary | ICD-10-CM

## 2015-09-22 DIAGNOSIS — R002 Palpitations: Secondary | ICD-10-CM | POA: Diagnosis not present

## 2015-09-22 NOTE — Patient Instructions (Signed)
Your physician recommends that you schedule a follow-up appointment as needed with Dr. Hilty.  

## 2015-09-22 NOTE — Progress Notes (Signed)
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  He has a long-standing history of body building over 30 years and has been a steroid user in the past. He denies using that currently however based on some of the heart problems that he's trainees have, he thought that he should get evaluated for his heart. He does not have any history of known cardiac disease, diabetes, dyslipidemia or other risk factors. There is no significant heart disease in his family. As mentioned he did have a history of anabolic steroid use in the past. He continues to lift weights and does aerobic exercise 4-5 times a week, for almost 60 minutes without any limitation such as chest pain or shortness of breath.  He occasionally gets some twinging in the left chest which goes away after a few minutes.  This does not sound particularly cardiac.  Edward Glass returns today for follow-up. He was previously asked to follow-up as needed as his stress test was negative. He recently tore his left triceps and underwent surgery. Preoperatively he had an EKG which was noted to be markedly abnormal. This demonstrated deep T-wave inversions in V3 through V6 as well as 23 and aVF with a wide QRS and a delayed notched upstroke of the QRS concerning for pre-excitation. Heart rate was 57. He reports he's recently been having palpitations and episodes which may last up to 30 minutes with a rapid heart rate. An EKG in the office today however shows a narrow QRS complex with the similar repolarization changes that were seen in the prior EKG. There is a notched QRS in V4.  Edward Glass returns today for follow-up. He was seen in the interim by Dr. 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 After he reviewed the EKGs that showed  no clear react septation. This cannot be demonstrated on wearing a monitor. He recommended conservative approaches at this time since Edward Glass is been generally asymptomatic. He reports palpitations are very infrequent at this point. He thinks some of his symptoms may been related to steroids.  PMHx:  Past Medical History  Diagnosis Date  . 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 Atypical chest pain 08/10/2013  . Elevated aspartate aminotransferase level 06/16/2015  . Excessive salivation 06/16/2015  . H/O nutritional disorder 06/16/2015  . 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 Screening for prostate cancer 08/21/2014  . 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 Hernia repair  2006  . Left tricep surgery      FAMHx:  Family History  Problem Relation Age of Onset  . Kidney failure Mother   . Cancer Mother   . Prostate cancer Father   . Cancer Father   . Diabetes Father   . Colon cancer Neg Hx   . Esophageal cancer Neg Hx   . Rectal cancer Neg Hx   . Stomach cancer Neg Hx     SOCHx:   reports that he has never smoked. He has never used smokeless tobacco. He reports that he does not drink alcohol or use illicit drugs.  ALLERGIES:  No Known Allergies  ROS: Pertinent items noted in HPI and remainder of comprehensive ROS otherwise negative.  HOME MEDS: Current Outpatient Prescriptions  Medication Sig Dispense Refill  .  aspirin 81 MG tablet Take 81 mg by mouth daily.    . cholecalciferol (VITAMIN D) 1000 UNITS tablet Take 1,000 Units by mouth daily.    . sildenafil (REVATIO) 20 MG tablet 2-5 pills as needed for ED symptoms 50 tablet 5   No current facility-administered medications for this visit.    LABS/IMAGING: No results found for this or any previous visit (from the past 48 hour(s)). No results found.  VITALS: BP 122/82 mmHg  Pulse 60  Ht 5\' 5"  (1.651 m)  Wt 177 lb 9.6 oz (80.559 kg)  BMI 29.55  kg/m2  EXAM: Deferred  EKG: Normal sinus rhythm, minimal criteria for LVH, ST elevation from the J point anterolaterally with a notched QRS in V4  PREOP EKG: Normal sinus rhythm with sinus arrhythmia at 60  ASSESSMENT: 1. Atypical chest pain - low risk stress test  2. Abnormal EKG consistent with possible preexcitation/WPW (recent tachy-palpitations) 3. History of anabolic steroid use  PLAN: 1.   [name] has been seen by Dr. Curt Bears with cardiac EP to felt that he could not demonstrate a clear reproducible preexcitation pattern on his EKG. Therefore he would recommend continued monitoring and if he were to develop worsening palpitations reevaluation for treatment either medical therapy or possible attempted ablation in the future. Some of his palpitations and symptoms could've been related to steroid use. His chest pain has improved. I would recommend follow-up on an as-needed basis. As the stress test was low risk and the symptoms are atypical, I feel that he could discontinue aspirin use.  Pixie Casino, MD, [facility] Attending Cardiologist [name] 09/22/2015, 1:43 PM

## 2016-03-02 DIAGNOSIS — R490 Dysphonia: Secondary | ICD-10-CM | POA: Insufficient documentation

## 2016-03-02 DIAGNOSIS — R059 Cough, unspecified: Secondary | ICD-10-CM | POA: Insufficient documentation

## 2016-03-02 DIAGNOSIS — K219 Gastro-esophageal reflux disease without esophagitis: Secondary | ICD-10-CM | POA: Insufficient documentation

## 2016-04-02 DIAGNOSIS — J309 Allergic rhinitis, unspecified: Secondary | ICD-10-CM | POA: Insufficient documentation

## 2018-07-26 DIAGNOSIS — Z9889 Other specified postprocedural states: Secondary | ICD-10-CM

## 2018-07-26 HISTORY — DX: Other specified postprocedural states: Z98.890

## 2018-08-26 DIAGNOSIS — I639 Cerebral infarction, unspecified: Secondary | ICD-10-CM

## 2018-08-26 HISTORY — DX: Cerebral infarction, unspecified: I63.9

## 2018-08-27 ENCOUNTER — Emergency Department (HOSPITAL_COMMUNITY): Payer: BLUE CROSS/BLUE SHIELD

## 2018-08-27 ENCOUNTER — Other Ambulatory Visit: Payer: Self-pay

## 2018-08-27 ENCOUNTER — Emergency Department (HOSPITAL_COMMUNITY)
Admission: EM | Admit: 2018-08-27 | Discharge: 2018-08-27 | Disposition: A | Discharge status: Home / Self Care | Payer: BLUE CROSS/BLUE SHIELD | Attending: Emergency Medicine | Admitting: Emergency Medicine

## 2018-08-27 DIAGNOSIS — Z7982 Long term (current) use of aspirin: Secondary | ICD-10-CM | POA: Diagnosis not present

## 2018-08-27 DIAGNOSIS — R55 Syncope and collapse: Secondary | ICD-10-CM | POA: Diagnosis not present

## 2018-08-27 DIAGNOSIS — Z79899 Other long term (current) drug therapy: Secondary | ICD-10-CM | POA: Diagnosis not present

## 2018-08-27 LAB — CBC WITH DIFFERENTIAL/PLATELET
Abs Immature Granulocytes: 0.02 10*3/uL (ref 0.00–0.07)
BASOS PCT: 1 %
Basophils Absolute: 0 10*3/uL (ref 0.0–0.1)
Eosinophils Absolute: 0.1 10*3/uL (ref 0.0–0.5)
Eosinophils Relative: 1 %
HCT: 46.8 % (ref 39.0–52.0)
Hemoglobin: 15.2 g/dL (ref 13.0–17.0)
IMMATURE GRANULOCYTES: 0 %
Lymphocytes Relative: 23 %
Lymphs Abs: 1.2 10*3/uL (ref 0.7–4.0)
MCH: 29.5 pg (ref 26.0–34.0)
MCHC: 32.5 g/dL (ref 30.0–36.0)
MCV: 90.7 fL (ref 80.0–100.0)
Monocytes Absolute: 0.7 10*3/uL (ref 0.1–1.0)
Monocytes Relative: 12 %
NEUTROS PCT: 63 %
Neutro Abs: 3.4 10*3/uL (ref 1.7–7.7)
Platelets: 260 10*3/uL (ref 150–400)
RBC: 5.16 MIL/uL (ref 4.22–5.81)
RDW: 13.2 % (ref 11.5–15.5)
WBC: 5.4 10*3/uL (ref 4.0–10.5)
nRBC: 0 % (ref 0.0–0.2)

## 2018-08-27 LAB — BASIC METABOLIC PANEL
ANION GAP: 7 (ref 5–15)
Anion gap: 6 (ref 5–15)
BUN: 23 mg/dL (ref 8–23)
BUN: 23 mg/dL (ref 8–23)
CO2: 23 mmol/L (ref 22–32)
CO2: 23 mmol/L (ref 22–32)
Calcium: 8.6 mg/dL — ABNORMAL LOW (ref 8.9–10.3)
Calcium: 9 mg/dL (ref 8.9–10.3)
Chloride: 107 mmol/L (ref 98–111)
Chloride: 107 mmol/L (ref 98–111)
Creatinine, Ser: 0.81 mg/dL (ref 0.61–1.24)
Creatinine, Ser: 0.78 mg/dL (ref 0.61–1.24)
GFR calc Af Amer: >60 mL/min (ref >60)
GFR calc Af Amer: >60 mL/min (ref >60)
GFR calc non Af Amer: >60 mL/min (ref >60)
GFR calc non Af Amer: >60 mL/min (ref >60)
GLUCOSE: 103 mg/dL — AB (ref 70–99)
Glucose, Bld: 102 mg/dL — ABNORMAL HIGH (ref 70–99)
Potassium: 6.3 mmol/L (ref 3.5–5.1)
Potassium: 4 mmol/L (ref 3.5–5.1)
Sodium: 136 mmol/L (ref 135–145)
Sodium: 137 mmol/L (ref 135–145)

## 2018-08-27 MED ORDER — SODIUM CHLORIDE 0.9 % IV BOLUS
500.0000 mL | Freq: Once | INTRAVENOUS | Status: AC
  Administered 2018-08-27: 500 mL via INTRAVENOUS

## 2018-08-27 NOTE — ED Triage Notes (Signed)
Pt BIB [facility]. Pt was at the gym about 1.5 hours into his workout which is his normal routine and began to feel dizzy. Pt did not lose consciousness. Pt reports that he now feels better. EMS reports that pt received 488mL of NSS en route to the ED. Pt reports feeling better upon ED arrival, denies any pain or dizziness. Pt reports he has had cold symptoms including a cough and a runny nose for about 4-5 days prior to this.

## 2018-08-27 NOTE — ED Notes (Signed)
Pt given discharge instructions. Pt given follow up information and given the opportunity to ask questions. Pt verbalized understanding. Pt discharged from the ED.

## 2018-08-27 NOTE — Discharge Instructions (Addendum)
Home to rest and hydrate today.  Follow-up with your doctor later this week.  Return to ER for any new or worsening symptoms.

## 2018-08-27 NOTE — ED Provider Notes (Signed)
[facility] DEPARTMENT Provider Note   CSN: [id number] Arrival date & time: 08/27/18  1124     History   Chief Complaint Chief Complaint  Patient presents with  . Near Syncope    HPI Edward Glass is a 63 y.o. male.  63yo male brought in by EMS from the gym for near syncope. Patient was an hour and a half into his normal workout routine, had completed all of the weights/squats and was about to start cardio on the treadmill, talking to friends when he began to feel dizzy and might pass out. Patient's friends assisted him to the ground, no LOC/syncope. Denies feeling chest pain, SHOB, diaphoresis, nausea/vomiting.  Patient was given 400 mils normal saline by EMS in route to the hospital, arrives and states he feels better without any complaints at this time.  Patient states that he has had a mild cough and congestion, took over-the-counter decongestant this morning which he does not normally take and does not know if this contributed to his symptoms today.  Patient denies fevers, chills, sweats, body aches, sick contacts.  Patient reports one episode of near syncope about 8 years ago, followed up with his doctor who told him that it may be a result of having stood up too fast that day.  Patient is otherwise healthy.  Patient reports eating a full breakfast this morning per normal before his workout.  No other complaints or concerns.     Past Medical History:  Diagnosis Date  . Atypical chest pain 08/10/2013  . Elevated aspartate aminotransferase level 06/16/2015  . Excessive salivation 06/16/2015  . H/O nutritional disorder 06/16/2015  . 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 Screening for prostate cancer 08/21/2014  . 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 Elevated aspartate aminotransferase level 06/16/2015  . H/O nutritional disorder 06/16/2015  . Excessive salivation 06/16/2015    . 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 Heart palpitations 06/16/2015  . 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 Screening for prostate cancer 08/21/2014  . Atypical chest pain 08/10/2013  . 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 HERNIA REPAIR  2006  . left tricep surgery          Home Medications    Prior to Admission medications   Medication Sig Start Date End Date Taking? Authorizing Provider  aspirin 81 MG tablet Take 81 mg by mouth daily.    [provider]  cholecalciferol (VITAMIN D) 1000 UNITS tablet Take 1,000 Units by mouth daily.    [provider]  sildenafil (REVATIO) 20 MG tablet 2-5 pills as needed for ED symptoms 08/21/14   Renato Shin, MD    Family History Family History  Problem Relation Age of Onset  . Kidney failure Mother   . Cancer Mother   . Prostate cancer Father   . Cancer Father   . Diabetes Father   . Colon cancer Neg Hx   . Esophageal cancer Neg Hx   . Rectal cancer Neg Hx   . Stomach cancer Neg Hx     Social History Social History   Tobacco Use  . Smoking status: Never Smoker  . Smokeless tobacco: Never Used  Substance Use Topics  . Alcohol use: No  . Drug use: No     Allergies   Patient has no known allergies.  Review of Systems Review of Systems  Constitutional: Negative for chills, diaphoresis and fever.  HENT: Positive for congestion. Negative for sore throat.   Respiratory: Positive for cough. Negative for chest tightness, shortness of breath and wheezing.   Cardiovascular: Negative for chest pain and palpitations.  Gastrointestinal: Negative for abdominal pain, nausea and vomiting.  Genitourinary: Negative for decreased urine volume.  Musculoskeletal: Negative for arthralgias and myalgias.  Skin: Negative for rash and wound.  Allergic/Immunologic: Negative for immunocompromised state.  Neurological: Positive for dizziness and  light-headedness.  Hematological: Does not bruise/bleed easily.  Psychiatric/Behavioral: Negative for confusion.  All other systems reviewed and are negative.    Physical Exam Updated Vital Signs BP (!) 166/79   Pulse 64   Temp 97.6 F (36.4 C) (Oral)   Resp (!) 22   Ht 5\' 5"  (1.651 m)   Wt 79.4 kg   SpO2 96%   BMI 29.12 kg/m   Physical Exam Vitals signs and nursing note reviewed.  Constitutional:      General: He is not in acute distress.    Appearance: He is well-developed. He is not diaphoretic.  HENT:     Head: Normocephalic and atraumatic.     Nose: Congestion present.     Mouth/Throat:     Mouth: Mucous membranes are moist.  Eyes:     Extraocular Movements: Extraocular movements intact.     Pupils: Pupils are equal, round, and reactive to light.  Neck:     Musculoskeletal: Neck supple.  Cardiovascular:     Rate and Rhythm: Normal rate and regular rhythm.     Pulses: Normal pulses.     Heart sounds: Normal heart sounds. No murmur.  Pulmonary:     Effort: Pulmonary effort is normal. No respiratory distress.     Breath sounds: Normal breath sounds.  Abdominal:     Tenderness: There is no abdominal tenderness.  Musculoskeletal:     Right lower leg: No edema.     Left lower leg: No edema.  Lymphadenopathy:     Cervical: No cervical adenopathy.  Skin:    General: Skin is warm and dry.     Findings: No erythema or rash.  Neurological:     General: No focal deficit present.     Mental Status: He is alert and oriented to person, place, and time.     GCS: GCS eye subscore is 4. GCS verbal subscore is 5. GCS motor subscore is 6.     Cranial Nerves: No cranial nerve deficit.     Sensory: Sensation is intact. No sensory deficit.     Motor: No weakness.  Psychiatric:        Behavior: Behavior normal.      ED Treatments / Results  Labs (all labs ordered are listed, but only abnormal results are displayed) Labs Reviewed  BASIC METABOLIC PANEL - Abnormal;  Notable for the following components:      Result Value   Potassium 6.3 (*)    Glucose, Bld 103 (*)    Calcium 8.6 (*)    All other components within normal limits  BASIC METABOLIC PANEL - Abnormal; Notable for the following components:   Glucose, Bld 102 (*)    All other components within normal limits  CBC WITH DIFFERENTIAL/PLATELET    EKG EKG Interpretation  Date/Time:  Sunday August 27 2018 11:54:54 EST Ventricular Rate:  55 PR Interval:    QRS Duration: 100 QT Interval:  441 QTC Calculation: 422 R Axis:  78 Text Interpretation:  Sinus rhythm Consider left atrial enlargement Probable left ventricular hypertrophy ST elevation, consider anterior injury Confirmed by Dene Gentry [phone]) on 08/27/2018 11:56:55 AM Also confirmed by Dene Gentry [phone]), editor Philomena Doheny [phone])  on 08/27/2018 1:37:49 PM   Radiology Dg Chest 2 View  Result Date: 08/27/2018 CLINICAL DATA:  Dizziness for 1 day. EXAM: CHEST - 2 VIEW COMPARISON:  Chest CT, 08/14/2013 FINDINGS: The heart size and mediastinal contours are within normal limits. Both lungs are clear. No pleural effusion or pneumothorax. The visualized skeletal structures are unremarkable. IMPRESSION: No active cardiopulmonary disease. Electronically Signed   By: Lajean Manes M.D.   On: 08/27/2018 12:40    Procedures Procedures (including critical care time)  Medications Ordered in ED Medications  sodium chloride 0.9 % bolus 500 mL (500 mLs Intravenous New Bag/Given 08/27/18 1418)     Initial Impression / Assessment and Plan / ED Course  I have reviewed the triage vital signs and the nursing notes.  Pertinent labs & imaging results that were available during my care of the patient were reviewed by me and considered in my medical decision making (see chart for details).  Clinical Course as of Aug 28 1455  Sun Aug 27, 7050  [id number] 63 year old male presents to the ER after feeling weak while at the gym today.  Patient was given IV  fluids while in route to the emergency room and upon arrival feels well without complaint.  Lab work and EKG completed, EKG unremarkable, initial BMP with hyperkalemia on hemolyzed sample, was repeated and potassium is within normal limits at 4.8.  CBC is unremarkable.  Patient continues to feel well and without complaint.  Patient should follow-up with his doctor this week, advised return to ER for any worsening or concerning symptoms.  Patient verbalizes understanding of discharge instructions and plan.   [LM]    Clinical Course User Index [LM] Tacy Learn, PA-C   Final Clinical Impressions(s) / ED Diagnoses   Final diagnoses:  Near syncope    ED Discharge Orders    None       Roque Lias 08/27/18 1457    Valarie Merino, MD 08/28/18 [phone]

## 2018-08-30 ENCOUNTER — Observation Stay (HOSPITAL_COMMUNITY): Payer: BLUE CROSS/BLUE SHIELD

## 2018-08-30 ENCOUNTER — Other Ambulatory Visit: Payer: Self-pay

## 2018-08-30 ENCOUNTER — Inpatient Hospital Stay (HOSPITAL_COMMUNITY)
Admission: EM | Admit: 2018-08-30 | Discharge: 2018-09-05 | DRG: 040 | Disposition: A | Discharge status: Rehabilitation Facility | Payer: BLUE CROSS/BLUE SHIELD | Attending: Internal Medicine | Admitting: Internal Medicine

## 2018-08-30 ENCOUNTER — Emergency Department (HOSPITAL_COMMUNITY): Payer: BLUE CROSS/BLUE SHIELD

## 2018-08-30 ENCOUNTER — Encounter (HOSPITAL_COMMUNITY): Payer: Self-pay | Admitting: Emergency Medicine

## 2018-08-30 DIAGNOSIS — Z7982 Long term (current) use of aspirin: Secondary | ICD-10-CM

## 2018-08-30 DIAGNOSIS — W19XXXA Unspecified fall, initial encounter: Secondary | ICD-10-CM | POA: Diagnosis present

## 2018-08-30 DIAGNOSIS — Q211 Atrial septal defect: Secondary | ICD-10-CM

## 2018-08-30 DIAGNOSIS — S62111A Displaced fracture of triquetrum [cuneiform] bone, right wrist, initial encounter for closed fracture: Secondary | ICD-10-CM | POA: Diagnosis present

## 2018-08-30 DIAGNOSIS — Z79899 Other long term (current) drug therapy: Secondary | ICD-10-CM

## 2018-08-30 DIAGNOSIS — H5340 Unspecified visual field defects: Secondary | ICD-10-CM | POA: Diagnosis present

## 2018-08-30 DIAGNOSIS — F329 Major depressive disorder, single episode, unspecified: Secondary | ICD-10-CM | POA: Diagnosis present

## 2018-08-30 DIAGNOSIS — Z841 Family history of disorders of kidney and ureter: Secondary | ICD-10-CM

## 2018-08-30 DIAGNOSIS — R1312 Dysphagia, oropharyngeal phase: Secondary | ICD-10-CM | POA: Diagnosis present

## 2018-08-30 DIAGNOSIS — G9340 Encephalopathy, unspecified: Secondary | ICD-10-CM | POA: Diagnosis not present

## 2018-08-30 DIAGNOSIS — I456 Pre-excitation syndrome: Secondary | ICD-10-CM | POA: Diagnosis present

## 2018-08-30 DIAGNOSIS — I10 Essential (primary) hypertension: Secondary | ICD-10-CM | POA: Diagnosis present

## 2018-08-30 DIAGNOSIS — I63512 Cerebral infarction due to unspecified occlusion or stenosis of left middle cerebral artery: Secondary | ICD-10-CM

## 2018-08-30 DIAGNOSIS — G9341 Metabolic encephalopathy: Secondary | ICD-10-CM | POA: Diagnosis not present

## 2018-08-30 DIAGNOSIS — R471 Dysarthria and anarthria: Secondary | ICD-10-CM | POA: Diagnosis present

## 2018-08-30 DIAGNOSIS — R29810 Facial weakness: Secondary | ICD-10-CM | POA: Diagnosis present

## 2018-08-30 DIAGNOSIS — E785 Hyperlipidemia, unspecified: Secondary | ICD-10-CM | POA: Diagnosis present

## 2018-08-30 DIAGNOSIS — R479 Unspecified speech disturbances: Secondary | ICD-10-CM | POA: Diagnosis not present

## 2018-08-30 DIAGNOSIS — N400 Enlarged prostate without lower urinary tract symptoms: Secondary | ICD-10-CM | POA: Diagnosis present

## 2018-08-30 DIAGNOSIS — Z8042 Family history of malignant neoplasm of prostate: Secondary | ICD-10-CM

## 2018-08-30 DIAGNOSIS — I639 Cerebral infarction, unspecified: Secondary | ICD-10-CM | POA: Diagnosis present

## 2018-08-30 DIAGNOSIS — I358 Other nonrheumatic aortic valve disorders: Secondary | ICD-10-CM | POA: Diagnosis present

## 2018-08-30 DIAGNOSIS — E291 Testicular hypofunction: Secondary | ICD-10-CM | POA: Diagnosis present

## 2018-08-30 DIAGNOSIS — R29707 NIHSS score 7: Secondary | ICD-10-CM | POA: Diagnosis present

## 2018-08-30 DIAGNOSIS — R609 Edema, unspecified: Secondary | ICD-10-CM

## 2018-08-30 DIAGNOSIS — Z833 Family history of diabetes mellitus: Secondary | ICD-10-CM

## 2018-08-30 DIAGNOSIS — G8191 Hemiplegia, unspecified affecting right dominant side: Secondary | ICD-10-CM | POA: Diagnosis present

## 2018-08-30 DIAGNOSIS — I63412 Cerebral infarction due to embolism of left middle cerebral artery: Principal | ICD-10-CM | POA: Diagnosis present

## 2018-08-30 DIAGNOSIS — R4701 Aphasia: Secondary | ICD-10-CM | POA: Diagnosis present

## 2018-08-30 LAB — COMPREHENSIVE METABOLIC PANEL
ALT: 35 U/L (ref 0–44)
AST: 40 U/L (ref 15–41)
Albumin: 3.8 g/dL (ref 3.5–5.0)
Alkaline Phosphatase: 65 U/L (ref 38–126)
Anion gap: 12 (ref 5–15)
BUN: 17 mg/dL (ref 8–23)
CO2: 23 mmol/L (ref 22–32)
Calcium: 9.4 mg/dL (ref 8.9–10.3)
Chloride: 105 mmol/L (ref 98–111)
Creatinine, Ser: 0.91 mg/dL (ref 0.61–1.24)
GFR calc non Af Amer: >60 mL/min (ref >60)
Glucose, Bld: 100 mg/dL — ABNORMAL HIGH (ref 70–99)
Potassium: 3.7 mmol/L (ref 3.5–5.1)
Sodium: 140 mmol/L (ref 135–145)
Total Bilirubin: 0.4 mg/dL (ref 0.3–1.2)
Total Protein: 7.1 g/dL (ref 6.5–8.1)

## 2018-08-30 LAB — CBC WITH DIFFERENTIAL/PLATELET
Abs Immature Granulocytes: 0.01 10*3/uL (ref 0.00–0.07)
Basophils Absolute: 0 10*3/uL (ref 0.0–0.1)
Basophils Relative: 0 %
Eosinophils Absolute: 0.1 10*3/uL (ref 0.0–0.5)
Eosinophils Relative: 1 %
HCT: 48.5 % (ref 39.0–52.0)
Hemoglobin: 15.4 g/dL (ref 13.0–17.0)
Immature Granulocytes: 0 %
Lymphocytes Relative: 33 %
Lymphs Abs: 2.2 10*3/uL (ref 0.7–4.0)
MCH: 29.2 pg (ref 26.0–34.0)
MCHC: 31.8 g/dL (ref 30.0–36.0)
MCV: 91.9 fL (ref 80.0–100.0)
Monocytes Absolute: 0.5 10*3/uL (ref 0.1–1.0)
Monocytes Relative: 7 %
Neutro Abs: 3.8 10*3/uL (ref 1.7–7.7)
Neutrophils Relative %: 59 %
Platelets: 269 10*3/uL (ref 150–400)
RBC: 5.28 MIL/uL (ref 4.22–5.81)
RDW: 12.7 % (ref 11.5–15.5)
WBC: 6.5 10*3/uL (ref 4.0–10.5)
nRBC: 0 % (ref 0.0–0.2)

## 2018-08-30 LAB — URINALYSIS, ROUTINE W REFLEX MICROSCOPIC
Bacteria, UA: NONE SEEN
Bilirubin Urine: NEGATIVE
Glucose, UA: NEGATIVE mg/dL
Ketones, ur: NEGATIVE mg/dL
Leukocytes, UA: NEGATIVE
Nitrite: NEGATIVE
Protein, ur: NEGATIVE mg/dL
SPECIFIC GRAVITY, URINE: 1.014 (ref 1.005–1.030)
pH: 5 (ref 5.0–8.0)

## 2018-08-30 LAB — RAPID URINE DRUG SCREEN, HOSP PERFORMED
Amphetamines: NOT DETECTED
BENZODIAZEPINES: NOT DETECTED
Barbiturates: NOT DETECTED
Cocaine: NOT DETECTED
Opiates: NOT DETECTED
Tetrahydrocannabinol: NOT DETECTED

## 2018-08-30 LAB — ETHANOL: Alcohol, Ethyl (B): <10 mg/dL (ref <10)

## 2018-08-30 MED ORDER — IOPAMIDOL (ISOVUE-370) INJECTION 76%
100.0000 mL | Freq: Once | INTRAVENOUS | Status: AC | PRN
  Administered 2018-08-30: 100 mL via INTRAVENOUS

## 2018-08-30 MED ORDER — SAW PALMETTO PO CAPS: 2.0000 | ORAL_CAPSULE | Freq: Every day | ORAL | Status: DC

## 2018-08-30 MED ORDER — ASPIRIN 325 MG PO TABS
325.0000 mg | ORAL_TABLET | Freq: Every day | ORAL | Status: DC
  Administered 2018-08-30: 325 mg via ORAL
  Filled 2018-08-30: qty 1

## 2018-08-30 MED ORDER — ACETAMINOPHEN 160 MG/5ML PO SOLN: 650.0000 mg | ORAL | Status: DC | PRN

## 2018-08-30 MED ORDER — MILK THISTLE 150 MG PO CAPS: ORAL_CAPSULE | Freq: Every day | ORAL | Status: DC

## 2018-08-30 MED ORDER — ACETAMINOPHEN 325 MG PO TABS: 650.0000 mg | ORAL_TABLET | ORAL | Status: DC | PRN

## 2018-08-30 MED ORDER — STROKE: EARLY STAGES OF RECOVERY BOOK
Freq: Once | Status: AC
  Administered 2018-08-30
  Filled 2018-08-30: qty 1

## 2018-08-30 MED ORDER — ENOXAPARIN SODIUM 40 MG/0.4ML ~~LOC~~ SOLN
40.0000 mg | SUBCUTANEOUS | Status: DC
  Administered 2018-08-31 – 2018-09-03 (×4): 40 mg via SUBCUTANEOUS
  Filled 2018-08-30 (×5): qty 0.4

## 2018-08-30 MED ORDER — CLOPIDOGREL BISULFATE 75 MG PO TABS
300.0000 mg | ORAL_TABLET | Freq: Once | ORAL | Status: AC
  Administered 2018-08-30: 300 mg via ORAL
  Filled 2018-08-30: qty 4

## 2018-08-30 MED ORDER — ATORVASTATIN CALCIUM 40 MG PO TABS
40.0000 mg | ORAL_TABLET | Freq: Every day | ORAL | Status: DC
  Administered 2018-09-01 – 2018-09-03 (×3): 40 mg via ORAL
  Filled 2018-08-30 (×4): qty 1

## 2018-08-30 MED ORDER — ALFUZOSIN HCL ER 10 MG PO TB24
10.0000 mg | ORAL_TABLET | ORAL | Status: DC
  Filled 2018-08-30 (×2): qty 1

## 2018-08-30 MED ORDER — CLOPIDOGREL BISULFATE 75 MG PO TABS
75.0000 mg | ORAL_TABLET | Freq: Every day | ORAL | Status: DC
  Administered 2018-08-31 – 2018-09-05 (×4): 75 mg via ORAL
  Filled 2018-08-30 (×4): qty 1

## 2018-08-30 MED ORDER — ADULT MULTIVITAMIN W/MINERALS CH
1.0000 | ORAL_TABLET | Freq: Every day | ORAL | Status: DC
  Administered 2018-08-31 – 2018-09-05 (×4): 1 via ORAL
  Filled 2018-08-30 (×4): qty 1

## 2018-08-30 MED ORDER — ASPIRIN 300 MG RE SUPP: 300.0000 mg | Freq: Every day | RECTAL | Status: DC

## 2018-08-30 MED ORDER — ACETAMINOPHEN 650 MG RE SUPP: 650.0000 mg | RECTAL | Status: DC | PRN

## 2018-08-30 MED ORDER — SENNOSIDES-DOCUSATE SODIUM 8.6-50 MG PO TABS
1.0000 | ORAL_TABLET | Freq: Every evening | ORAL | Status: DC | PRN
  Administered 2018-08-31 – 2018-09-03 (×2): 1 via ORAL
  Filled 2018-08-30 (×2): qty 1

## 2018-08-30 MED ORDER — IOPAMIDOL (ISOVUE-370) INJECTION 76%
INTRAVENOUS | Status: AC
  Filled 2018-08-30: qty 50

## 2018-08-30 MED ORDER — ONDANSETRON HCL 4 MG/2ML IJ SOLN: 4.0000 mg | Freq: Three times a day (TID) | INTRAMUSCULAR | Status: DC | PRN

## 2018-08-30 NOTE — ED Triage Notes (Signed)
Pt reports difficulty speaking and slurred speech that started today.

## 2018-08-30 NOTE — ED Provider Notes (Signed)
[facility] EM[facility] DEPARTMENT Provider Note   CSN: [id number] Arrival date & time: 08/30/18  1532     History   Chief Complaint Chief Complaint  Patient presents with  . Aphasia    HPI Edward Glass is a 63 y.o. male. Level heart caveat due to some confusion. HPI Patient presents with difficulty speaking and some confusion.  Reportedly started yesterday.  States he had trouble getting the words out.  Somewhat difficult with a history but it sounds both like he had trouble getting the words out and the words were harsher.  Has had a cough and nasal congestion.  Recently seen in the ER and was generally weak at that time.  No chest pain.  He is here with his partner. Past Medical History:  Diagnosis Date  . Atypical chest pain 08/10/2013  . Elevated aspartate aminotransferase level 06/16/2015  . Excessive salivation 06/16/2015  . H/O nutritional disorder 06/16/2015  . 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 Screening for prostate cancer 08/21/2014  . 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 Elevated aspartate aminotransferase level 06/16/2015  . H/O nutritional disorder 06/16/2015  . Excessive salivation 06/16/2015  . 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 Heart palpitations 06/16/2015  . 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 Screening for prostate cancer 08/21/2014  . Atypical chest pain 08/10/2013  . 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 HERNIA REPAIR  2006  . left tricep surgery          Home Medications    Prior to Admission medications   Medication Sig Start Date End Date Taking? Authorizing Provider  alfuzosin (UROXATRAL) 10 MG 24 hr tablet Take 10 mg by mouth 3 (three) times a week. 07/21/18  Yes [provider]  aspirin 81 MG tablet Take 81 mg by mouth daily.   Yes [provider]  MILK THISTLE PO Take 2 tablets by mouth daily.   Yes [provider]  Misc Natural Products (SAW PALMETTO) CAPS Take 2-4 capsules by mouth daily.   Yes [provider]  multivitamin (ONE-A-DAY MEN'S) TABS tablet Take 1 tablet by mouth daily.   Yes [provider]  sildenafil (REVATIO) 20 MG tablet 2-5 pills as needed for ED symptoms Patient not taking: Reported on 08/30/2018 08/21/14   Renato Shin, MD    Family History Family History  Problem Relation Age of Onset  . Kidney failure Mother   . Cancer Mother   . Prostate cancer Father   . Cancer Father   . Diabetes Father   . Colon cancer Neg Hx   . Esophageal cancer Neg Hx   . Rectal cancer Neg Hx   . Stomach cancer Neg Hx     Social History Social History   Tobacco Use  . Smoking status: Never Smoker  . Smokeless tobacco: Never Used  Substance Use Topics  . Alcohol use: No  . Drug use: No     Allergies   Patient has no known allergies.   Review of Systems Review of Systems  Unable to perform ROS: Mental status change     Physical Exam Updated Vital Signs BP 135/82   Pulse (!) 58   Temp 98.2 F (36.8 C) (Oral)   Resp 19   Ht 5\' 5"  (1.651 m)   Wt 79.3 kg  SpO2 99%   BMI 29.09 kg/m   Physical Exam Constitutional:      Comments: Patient is muscular  HENT:     Head: Atraumatic.     Mouth/Throat:     Mouth: Mucous membranes are moist.  Eyes:     Pupils: Pupils are equal, round, and reactive to light.  Neck:     Musculoskeletal: Neck supple.  Cardiovascular:     Rate and Rhythm: Regular rhythm.  Pulmonary:     Breath sounds: No rhonchi or rales.  Chest:     Chest wall: No tenderness.  Abdominal:     Tenderness: There is no abdominal tenderness.  Skin:    Capillary Refill: Capillary refill takes less than 2 seconds.  Neurological:     Mental Status: He is alert.     Comments: Patient somewhat slow to answer questions.  Difficult to get history from.  Moves all  extremities.  Face symmetric.  Voice somewhat harsh but able to speak and answer questions.  Able to identify a pen in front of him.  Knows who came to the ER with.  Good straight leg raise bilaterally      ED Treatments / Results  Labs (all labs ordered are listed, but only abnormal results are displayed) Labs Reviewed  COMPREHENSIVE METABOLIC PANEL - Abnormal; Notable for the following components:      Result Value   Glucose, Bld 100 (*)    All other components within normal limits  URINALYSIS, ROUTINE W REFLEX MICROSCOPIC - Abnormal; Notable for the following components:   Color, Urine STRAW (*)    Hgb urine dipstick SMALL (*)    All other components within normal limits  CBC WITH DIFFERENTIAL/PLATELET  RAPID URINE DRUG SCREEN, HOSP PERFORMED  ETHANOL    EKG EKG Interpretation  Date/Time:  Wednesday August 30 2018 15:44:22 EST Ventricular Rate:  58 PR Interval:    QRS Duration: 97 QT Interval:  434 QTC Calculation: 427 R Axis:   58 Text Interpretation:  Sinus rhythm Probable left ventricular hypertrophy Anterior ST elevation, probably due to LVH No significant change since last tracing Confirmed by Davonna Belling [phone]) on 08/30/2018 3:49:41 PM Also confirmed by Davonna Belling [phone]), editor Radene Gunning [phone])  on 08/30/2018 4:48:47 PM   Radiology Dg Chest 2 View  Result Date: 08/30/2018 CLINICAL DATA:  63 year old male with slurred speech and cough today. EXAM: CHEST - 2 VIEW COMPARISON:  08/27/2018 chest radiographs and earlier. FINDINGS: Lower lung volumes on both views. No pneumothorax, pulmonary edema, pleural effusion or consolidation. Mild basilar crowding of markings. Stable cardiac size at the upper limits of normal. Other mediastinal contours are within normal limits. Visualized tracheal air column is within normal limits. No acute osseous abnormality identified. Negative visible bowel gas pattern. Previous ventral abdominal hernia repair with mesh.  IMPRESSION: Lower lung volumes, otherwise no acute cardiopulmonary abnormality. Electronically Signed   By: Genevie Ann M.D.   On: 08/30/2018 17:15   Ct Head Wo Contrast  Result Date: 08/30/2018 CLINICAL DATA:  64 year old male with abnormal speech onset today. EXAM: CT HEAD WITHOUT CONTRAST TECHNIQUE: Contiguous axial images were obtained from the base of the skull through the vertex without intravenous contrast. COMPARISON:  Brain MRI 11/19/2008. FINDINGS: Brain: A small area of right parietal lobe cortical encephalomalacia is redemonstrated on series 3, image 22. Cerebral volume has not significantly changed. New abnormal hypodensity in the left posterior limb internal capsule and/or lentiform (series 3, image 14). New hypodensity also in the  left caudate and anterior lentiform on image 17. No acute intracranial hemorrhage identified. No midline shift, mass effect, or evidence of intracranial mass lesion. No ventriculomegaly. No acute cortically based infarct identified. Incidental tentorial dural calcifications. Vascular: No suspicious intracranial vascular hyperdensity. Skull: Negative. Sinuses/Orbits: Right frontal ethmoid and maxillary sinus disease with fluid levels is new since 2010. The right sphenoid and left paranasal sinuses are spared. Tympanic cavities and mastoids are clear. Other: Mildly Disconjugate gaze, otherwise negative orbits. Visualized scalp soft tissues are within normal limits. IMPRESSION: 1. 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 2. Right [facility] pattern paranasal sinusitis. Electronically Signed   By: Genevie Ann M.D.   On: 08/30/2018 17:39    Procedures Procedures (including critical care time)  Medications Ordered in ED Medications - No data to display   Initial Impression / Assessment and Plan / ED Course  I have reviewed the triage vital signs and the nursing notes.  Pertinent labs & imaging results that were available during my care of  the patient were reviewed by me and considered in my medical decision making (see chart for details).     *Patient with difficulty speaking and some mental status changes.  Has been going since yesterday but apparently had some URI symptoms a couple days ago.  Still had some slurred speech and difficulty with speaking.  Also seemed little confused.  Work-up overall reassuring but does have a potentially old stroke versus subacute.  I think with difficulty speaking and confusion patient benefit of admission to the hospital.  Will discuss with hospitalist.  Final Clinical Impressions(s) / ED Diagnoses   Final diagnoses:  Encephalopathy  Difficulty speaking    ED Discharge Orders    None       Davonna Belling, MD 08/30/18 1941
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
## 2018-08-31 ENCOUNTER — Observation Stay (HOSPITAL_COMMUNITY): Payer: BLUE CROSS/BLUE SHIELD

## 2018-08-31 DIAGNOSIS — N401 Enlarged prostate with lower urinary tract symptoms: Secondary | ICD-10-CM

## 2018-08-31 DIAGNOSIS — M25511 Pain in right shoulder: Secondary | ICD-10-CM | POA: Diagnosis not present

## 2018-08-31 DIAGNOSIS — R338 Other retention of urine: Secondary | ICD-10-CM | POA: Diagnosis not present

## 2018-08-31 DIAGNOSIS — I6389 Other cerebral infarction: Secondary | ICD-10-CM | POA: Diagnosis not present

## 2018-08-31 DIAGNOSIS — I63519 Cerebral infarction due to unspecified occlusion or stenosis of unspecified middle cerebral artery: Secondary | ICD-10-CM

## 2018-08-31 DIAGNOSIS — G8191 Hemiplegia, unspecified affecting right dominant side: Secondary | ICD-10-CM | POA: Diagnosis present

## 2018-08-31 DIAGNOSIS — Z841 Family history of disorders of kidney and ureter: Secondary | ICD-10-CM | POA: Diagnosis not present

## 2018-08-31 DIAGNOSIS — H5340 Unspecified visual field defects: Secondary | ICD-10-CM | POA: Diagnosis present

## 2018-08-31 DIAGNOSIS — R4701 Aphasia: Secondary | ICD-10-CM | POA: Diagnosis present

## 2018-08-31 DIAGNOSIS — I6300 Cerebral infarction due to thrombosis of unspecified precerebral artery: Secondary | ICD-10-CM | POA: Diagnosis not present

## 2018-08-31 DIAGNOSIS — F329 Major depressive disorder, single episode, unspecified: Secondary | ICD-10-CM | POA: Diagnosis present

## 2018-08-31 DIAGNOSIS — I456 Pre-excitation syndrome: Secondary | ICD-10-CM | POA: Diagnosis present

## 2018-08-31 DIAGNOSIS — I639 Cerebral infarction, unspecified: Secondary | ICD-10-CM | POA: Diagnosis present

## 2018-08-31 DIAGNOSIS — E291 Testicular hypofunction: Secondary | ICD-10-CM | POA: Diagnosis present

## 2018-08-31 DIAGNOSIS — W19XXXA Unspecified fall, initial encounter: Secondary | ICD-10-CM | POA: Diagnosis present

## 2018-08-31 DIAGNOSIS — I358 Other nonrheumatic aortic valve disorders: Secondary | ICD-10-CM | POA: Diagnosis present

## 2018-08-31 DIAGNOSIS — R1312 Dysphagia, oropharyngeal phase: Secondary | ICD-10-CM | POA: Diagnosis present

## 2018-08-31 DIAGNOSIS — Z8042 Family history of malignant neoplasm of prostate: Secondary | ICD-10-CM | POA: Diagnosis not present

## 2018-08-31 DIAGNOSIS — I63412 Cerebral infarction due to embolism of left middle cerebral artery: Secondary | ICD-10-CM | POA: Diagnosis present

## 2018-08-31 DIAGNOSIS — R471 Dysarthria and anarthria: Secondary | ICD-10-CM | POA: Diagnosis present

## 2018-08-31 DIAGNOSIS — I69391 Dysphagia following cerebral infarction: Secondary | ICD-10-CM | POA: Diagnosis not present

## 2018-08-31 DIAGNOSIS — Q211 Atrial septal defect: Secondary | ICD-10-CM | POA: Diagnosis not present

## 2018-08-31 DIAGNOSIS — R479 Unspecified speech disturbances: Secondary | ICD-10-CM | POA: Diagnosis not present

## 2018-08-31 DIAGNOSIS — Z79899 Other long term (current) drug therapy: Secondary | ICD-10-CM | POA: Diagnosis not present

## 2018-08-31 DIAGNOSIS — I63512 Cerebral infarction due to unspecified occlusion or stenosis of left middle cerebral artery: Secondary | ICD-10-CM | POA: Diagnosis not present

## 2018-08-31 DIAGNOSIS — I69320 Aphasia following cerebral infarction: Secondary | ICD-10-CM

## 2018-08-31 DIAGNOSIS — I69322 Dysarthria following cerebral infarction: Secondary | ICD-10-CM | POA: Diagnosis not present

## 2018-08-31 DIAGNOSIS — G9340 Encephalopathy, unspecified: Secondary | ICD-10-CM | POA: Diagnosis present

## 2018-08-31 DIAGNOSIS — K5901 Slow transit constipation: Secondary | ICD-10-CM | POA: Diagnosis not present

## 2018-08-31 DIAGNOSIS — M19011 Primary osteoarthritis, right shoulder: Secondary | ICD-10-CM | POA: Diagnosis not present

## 2018-08-31 DIAGNOSIS — R29707 NIHSS score 7: Secondary | ICD-10-CM | POA: Diagnosis present

## 2018-08-31 DIAGNOSIS — I69351 Hemiplegia and hemiparesis following cerebral infarction affecting right dominant side: Secondary | ICD-10-CM | POA: Diagnosis not present

## 2018-08-31 DIAGNOSIS — S62101S Fracture of unspecified carpal bone, right wrist, sequela: Secondary | ICD-10-CM | POA: Diagnosis not present

## 2018-08-31 DIAGNOSIS — I10 Essential (primary) hypertension: Secondary | ICD-10-CM | POA: Diagnosis present

## 2018-08-31 DIAGNOSIS — G8929 Other chronic pain: Secondary | ICD-10-CM | POA: Diagnosis not present

## 2018-08-31 DIAGNOSIS — Z833 Family history of diabetes mellitus: Secondary | ICD-10-CM | POA: Diagnosis not present

## 2018-08-31 DIAGNOSIS — N400 Enlarged prostate without lower urinary tract symptoms: Secondary | ICD-10-CM | POA: Diagnosis present

## 2018-08-31 DIAGNOSIS — S62111A Displaced fracture of triquetrum [cuneiform] bone, right wrist, initial encounter for closed fracture: Secondary | ICD-10-CM | POA: Diagnosis present

## 2018-08-31 DIAGNOSIS — S62101A Fracture of unspecified carpal bone, right wrist, initial encounter for closed fracture: Secondary | ICD-10-CM | POA: Diagnosis not present

## 2018-08-31 DIAGNOSIS — G9341 Metabolic encephalopathy: Secondary | ICD-10-CM | POA: Diagnosis present

## 2018-08-31 DIAGNOSIS — E785 Hyperlipidemia, unspecified: Secondary | ICD-10-CM | POA: Diagnosis present

## 2018-08-31 DIAGNOSIS — R29810 Facial weakness: Secondary | ICD-10-CM | POA: Diagnosis present

## 2018-08-31 DIAGNOSIS — Z7982 Long term (current) use of aspirin: Secondary | ICD-10-CM | POA: Diagnosis not present

## 2018-08-31 LAB — LIPID PANEL
CHOLESTEROL: 173 mg/dL (ref 0–200)
HDL: 52 mg/dL (ref >40)
LDL CALC: 104 mg/dL — AB (ref 0–99)
Total CHOL/HDL Ratio: 3.3 RATIO
Triglycerides: 86 mg/dL (ref <150)
VLDL: 17 mg/dL (ref 0–40)

## 2018-08-31 LAB — HEMOGLOBIN A1C
Hgb A1c MFr Bld: 6.2 % — ABNORMAL HIGH (ref 4.8–5.6)
Mean Plasma Glucose: 131.24 mg/dL

## 2018-08-31 LAB — ECHOCARDIOGRAM COMPLETE
Height: 65 in
Weight: 2797.2 oz

## 2018-08-31 LAB — HIV ANTIBODY (ROUTINE TESTING W REFLEX): HIV Screen 4th Generation wRfx: NONREACTIVE

## 2018-08-31 MED ORDER — FLUOXETINE HCL 20 MG PO CAPS
20.0000 mg | ORAL_CAPSULE | Freq: Every day | ORAL | Status: DC
  Filled 2018-08-31 (×3): qty 1

## 2018-08-31 MED ORDER — ASPIRIN EC 81 MG PO TBEC
81.0000 mg | DELAYED_RELEASE_TABLET | Freq: Every day | ORAL | Status: DC
  Administered 2018-09-02 – 2018-09-03 (×2): 81 mg via ORAL
  Filled 2018-08-31 (×2): qty 1

## 2018-08-31 NOTE — Progress Notes (Signed)
Inpatient Rehabilitation-Admissions Coordinator    Met with patient and his family and friends at the bedside to discuss team's recommendation for inpatient rehabilitation. Shared booklets, expectations while in CIR, expected length of stay, and anticipated functional level at DC. Pt indicating interest in CIR. Family on board to support at DC. AC will begin insurance authorization for possible admit.   Will await insurance determination and medical readiness (noted possible plan for TEE and loop).   Jhonnie Garner, OTR/L  Rehab Admissions Coordinator  [phone] 08/31/2018 5:40 PM

## 2018-08-31 NOTE — Progress Notes (Signed)
Echocardiogram 2D Echocardiogram has been performed.  Edward Glass 08/31/2018, 10:38 AM

## 2018-08-31 NOTE — Care Management Note (Signed)
Case Management Note  Patient Details  Name: Edward Glass MRN: [id number] Date of Birth: 09-22-1955  Subjective/Objective:      Pt admitted with a stroke. He is from home alone.  DME: none No issues with medications at home. No issues with transportation.              Action/Plan: Recommendations are for CIR. Family states they can provide supervision and support after rehab stay. CM following for d/c disposition.   Expected Discharge Date:                  Expected Discharge Plan:  [place]  In-House Referral:     Discharge planning Services  CM Consult  Post Acute Care Choice:    Choice offered to:     DME Arranged:    DME Agency:     HH Arranged:    [place] Agency:     Status of Service:  In process, will continue to follow  If discussed at Long Length of Stay Meetings, dates discussed:    Additional Comments:  Pollie Friar, RN 08/31/2018, 3:13 PM

## 2018-08-31 NOTE — Evaluation (Signed)
Physical Therapy Evaluation Patient Details Name: Edward Glass MRN: [id number] DOB: February 23, 1956 Today's Date: 08/31/2018   History of Present Illness  Patient is a 63 y/o male presenting to the ED on 08/30/2018 with primary complaints of aphasia, R facila droop and R sided weakness. CT revealing Age indeterminate small vessel ischemia in the left basal ganglia. 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 PMH significant for hypogonadism, possible BPH.     Clinical Impression  Patient admitted with the above listed diagnosis. Patient reports IND with mobility prior to admission and worked as a Physiological scientist. Patient today requiring physical assist for all bed level mobility, transfers, and gait. Patient with poor upright standing balance requiring Mod A throughout to maintain. Noted R sided weakness with poor R LE advancement during swing phase of gait. PT to highly recommend CIR level therapies at discharge as patient was IND prior to admission and now requires physical assist for all aspects of mobility. PT to continue to follow.      Follow Up Recommendations CIR    Equipment Recommendations  Other (comment)(defer)    Recommendations for Other Services Rehab consult;OT consult;Speech consult     Precautions / Restrictions Precautions Precautions: Fall Restrictions Weight Bearing Restrictions: No      Mobility  Bed Mobility Overal bed mobility: Needs Assistance Bed Mobility: Supine to Sit;Sit to Supine     Supine to sit: Min assist Sit to supine: Min assist   General bed mobility comments: increased time and effort by patient; disuse of R UE to assist  Transfers Overall transfer level: Needs assistance Equipment used: 1 person hand held assist Transfers: Sit to/from [facility] Sit to  Stand: Mod assist Stand pivot transfers: Mod assist       General transfer comment: heavy Mod A to rise from bed and for stand pivot transfer; patient with heavy use of  L LE to power up; cueing for weight shifting; initially with posterior LOB with use of LE on bed frame - Mod A to maintain upright standing balance  Ambulation/Gait Ambulation/Gait assistance: Mod assist;+2 physical assistance;+2 safety/equipment Gait Distance (Feet): 20 Feet Assistive device: 1 person hand held assist Gait Pattern/deviations: Step-to pattern;Decreased step length - right;Decreased step length - left;Decreased stance time - right;Decreased weight shift to right;Narrow base of support Gait velocity: decreased   General Gait Details: Heavy Mod A for gait with 1 HHA at LUE; poor balance throughout; required chair follow; noted poor weight shift to R LE as well as limited hip flexion needed for swing phase of gait  Stairs            Wheelchair Mobility    Modified Rankin (Stroke Patients Only) Modified Rankin (Stroke Patients Only) Pre-Morbid Rankin Score: No symptoms Modified Rankin: Moderately severe disability     Balance Overall balance assessment: Needs assistance Sitting-balance support: Bilateral upper extremity supported;Feet supported Sitting balance-Leahy Scale: Fair     Standing balance support: Single extremity supported;During functional activity Standing balance-Leahy Scale: Poor                               Pertinent Vitals/Pain Pain Assessment: No/denies pain    Home Living Family/patient expects to be discharged to:: Private residence Living Arrangements: Alone Available Help at Discharge: Family Type of Home: House  Home Access: Stairs to enter Entrance Stairs-Rails: None Entrance Stairs-Number of Steps: 2 Home Layout: Two level Home Equipment: Shower seat      Prior Function Level of Independence: Independent         Comments: works as a  Arboriculturist: Right    Extremity/Trunk Assessment   Upper Extremity Assessment Upper Extremity Assessment: Defer to OT evaluation    Lower Extremity Assessment Lower Extremity Assessment: Generalized weakness;LLE deficits/detail;[name] deficits/detail [name] Deficits / Details: grossly 3+/5 [name] Sensation: decreased proprioception LLE Deficits / Details: grossly 5/5 LLE Sensation: WNL    Cervical / Trunk Assessment Cervical / Trunk Assessment: Normal  Communication   Communication: Expressive difficulties  Cognition Arousal/Alertness: Awake/alert Behavior During Therapy: Flat affect Overall Cognitive Status: Difficult to assess                                        General Comments General comments (skin integrity, edema, etc.): family present and supportive    Exercises     Assessment/Plan    PT Assessment Patient needs continued PT services  PT Problem List Decreased strength;Decreased activity tolerance;Decreased balance;Decreased coordination;Decreased mobility;Decreased knowledge of use of DME;Decreased safety awareness       PT Treatment Interventions DME instruction;Gait training;Stair training;Functional mobility training;Therapeutic activities;Therapeutic exercise;Balance training;Neuromuscular re-education;Patient/family education    PT Goals (Current goals can be found in the Care Plan section)  Acute Rehab PT Goals Patient Stated Goal: "regain independence" PT Goal Formulation: With patient Time For Goal Achievement: 09/14/18 Potential to Achieve Goals: Good    Frequency Min 4X/week   Barriers to discharge        Co-evaluation               AM-PAC PT "6 Clicks" Mobility  Outcome Measure Help needed turning from your back to your side while in a flat bed without using bedrails?: A Lot Help needed moving from lying on your back to sitting on the side of a flat bed without using bedrails?:  A Lot Help needed moving to and from a bed to a chair (including a wheelchair)?: A Lot Help needed standing up from a chair using your arms (e.g., wheelchair or bedside chair)?: A Lot Help needed to walk in hospital room?: A Lot Help needed climbing 3-5 steps with a railing? : Total 6 Click Score: 11    End of Session Equipment Utilized During Treatment: Gait belt Activity Tolerance: Patient tolerated treatment well;Patient limited by fatigue Patient left: in chair;with call bell/phone within reach;with family/visitor present(transport arriving) Nurse Communication: Mobility status PT Visit Diagnosis: Unsteadiness on feet (R26.81);Other abnormalities of gait and mobility (R26.89);Muscle weakness (generalized) (M62.81)    Time: 5956-3875 PT Time Calculation (min) (ACUTE ONLY): 32 min   Charges:   PT Evaluation $PT Eval Moderate Complexity: 1 Mod PT Treatments $Gait Training: 8-22 mins         Lanney Gins, PT, DPT Supplemental Physical Therapist 08/31/18 10:19 AM Pager: [phone] Office: [phone]

## 2018-08-31 NOTE — Progress Notes (Signed)
PROGRESS NOTE    Edward Glass   KPT:[id number]  DOB: [birth year]  DOA: 08/30/2018 PCP: Lurline Hare, MD   Brief Narrative:  Edward Glass is a 63 y.o. male with medical history significant of hypogonadism, possible BPH, who presents with   difficulty speaking, right facial droop and right arm and leg weakness.   He was not a candidate for TPA. Given Aspirin and a Plavix load.   Subjective: Mild improvement in speech and weakness.     Assessment & Plan:   Principal Problem:   CVA (cerebral vascular accident) - CTA head/neck> Short-segment severe mid left M1 stenosis, likely similar as compared to previous MRI from 11/19/2008. - MRI> Multiple small foci of acute/early subacute infarction are present in the left MCA distribution concentrated in left basal ganglia inclusive of the corona radiata and posterior limb of internal capsule. - LDL 104- Lipitor added - A1c 6.2 - PT eval completed- recommended CIR - I have consulted them - ECHO pending  Active Problems: BPH - Alfuzosin continued   Time spent in minutes: 35 DVT prophylaxis: Lovenox Code Status: Full code Family Communication: daughters, brother Disposition Plan: to be determined Consultants:   Neuro Procedures:   ECHO Antimicrobials:  Anti-infectives (From admission, onward)   None       Objective: Vitals:   08/31/18 0300 08/31/18 0342 08/31/18 0722 08/31/18 1104  BP: 106/65 (!) 108/58 112/65 111/65  Pulse: (!) 58 (!) 55 60 61  Resp: 18 18 18 16   Temp: 98.2 F (36.8 C) 98.2 F (36.8 C) 98.7 F (37.1 C) 98 F (36.7 C)  TempSrc: Oral Oral Oral Oral  SpO2: 96% 97% 97% 96%  Weight:      Height:        Intake/Output Summary (Last 24 hours) at 08/31/2018 1357 Last data filed at 08/30/2018 1802 Gross per 24 hour  Intake -  Output 300 ml  Net -300 ml   Filed Weights   08/30/18 1546  Weight: 79.3 kg    Examination: General exam: Appears comfortable  HEENT: PERRLA, oral mucosa  moist, no sclera icterus or thrush Respiratory system: Clear to auscultation. Respiratory effort normal. Cardiovascular system: S1 & S2 heard, RRR.   Gastrointestinal system: Abdomen soft, non-tender, nondistended. Normal bowel sounds. Central nervous system: Alert and oriented. Dysarthria, 4/5 weakness on right. Extremities: No cyanosis, clubbing or edema Skin: No rashes or ulcers Psychiatry:  Mood & affect appropriate.     Data Reviewed: I have personally reviewed following labs and imaging studies  CBC: Recent Labs  Lab 08/27/18 1132 08/30/18 1546  WBC 5.4 6.5  NEUTROABS 3.4 3.8  HGB 15.2 15.4  HCT 46.8 48.5  MCV 90.7 91.9  PLT 260 170   Basic Metabolic Panel: Recent Labs  Lab 08/27/18 1255 08/27/18 1412 08/30/18 1546  NA 136 137 140  K 6.3* 4.0 3.7  CL 107 107 105  CO2 23 23 23   GLUCOSE 103* 102* 100*  BUN 23 23 17   CREATININE 0.81 0.78 0.91  CALCIUM 8.6* 9.0 9.4   GFR: Estimated Creatinine Clearance: 80.6 mL/min (by C-G formula based on SCr of 0.91 mg/dL). Liver Function Tests: Recent Labs  Lab 08/30/18 1546  AST 40  ALT 35  ALKPHOS 65  BILITOT 0.4  PROT 7.1  ALBUMIN 3.8   No results for input(s): LIPASE, AMYLASE in the last 168 hours. No results for input(s): AMMONIA in the last 168 hours. Coagulation Profile: No results for input(s): INR, PROTIME in the last  168 hours. Cardiac Enzymes: No results for input(s): CKTOTAL, CKMB, CKMBINDEX, TROPONINI in the last 168 hours. BNP (last 3 results) No results for input(s): PROBNP in the last 8760 hours. HbA1C: Recent Labs    08/31/18 0447  HGBA1C 6.2*   CBG: No results for input(s): GLUCAP in the last 168 hours. Lipid Profile: Recent Labs    08/31/18 0447  CHOL 173  HDL 52  LDLCALC 104*  TRIG 86  CHOLHDL 3.3   Thyroid Function Tests: No results for input(s): TSH, T4TOTAL, FREET4, T3FREE, THYROIDAB in the last 72 hours. Anemia Panel: No results for input(s): VITAMINB12, FOLATE, FERRITIN,  TIBC, IRON, RETICCTPCT in the last 72 hours. Urine analysis:    Component Value Date/Time   COLORURINE STRAW (A) 08/30/2018 1801   APPEARANCEUR CLEAR 08/30/2018 1801   LABSPEC 1.014 08/30/2018 1801   PHURINE 5.0 08/30/2018 1801   GLUCOSEU NEGATIVE 08/30/2018 1801   HGBUR SMALL (A) 08/30/2018 1801   BILIRUBINUR NEGATIVE 08/30/2018 1801   BILIRUBINUR neg 10/03/2013 1529   KETONESUR NEGATIVE 08/30/2018 1801   PROTEINUR NEGATIVE 08/30/2018 1801   UROBILINOGEN 0.2 10/03/2013 1529   NITRITE NEGATIVE 08/30/2018 1801   LEUKOCYTESUR NEGATIVE 08/30/2018 1801   Sepsis Labs: @LABRCNTIP (procalcitonin:4,lacticidven:4) )No results found for this or any previous visit (from the past 240 hour(s)).       Radiology Studies: Ct Angio Head W Or Wo Contrast  Result Date: 08/30/2018 CLINICAL DATA:  Initial evaluation for acute speech difficulty, right facial weakness. EXAM: CT ANGIOGRAPHY HEAD AND NECK CT PERFUSION BRAIN TECHNIQUE: Multidetector CT imaging of the head and neck was performed using the standard protocol during bolus administration of intravenous contrast. Multiplanar CT image reconstructions and MIPs were obtained to evaluate the vascular anatomy. Carotid stenosis measurements (when applicable) are obtained utilizing NASCET criteria, using the distal internal carotid diameter as the denominator. Multiphase CT imaging of the brain was performed following IV bolus contrast injection. Subsequent parametric perfusion maps were calculated using RAPID software. CONTRAST:  18mL ISOVUE-370 IOPAMIDOL (ISOVUE-370) INJECTION 76% COMPARISON:  Prior noncontrast CT from earlier same day. FINDINGS: CTA NECK FINDINGS Aortic arch: Visualized aortic arch of normal caliber with normal branch pattern. No hemodynamically significant stenosis about the origin of the great vessels. Visualized subclavian arteries widely patent. Right carotid system: Right common carotid artery patent from its origin to the bifurcation  without stenosis. Minimal mixed plaque about the right bifurcation without stenosis. Short-segment mild stenosis of approximately 20% by NASCET criteria just distally within the proximal right ICA. Right ICA otherwise widely patent to the skull base without stenosis, dissection, or occlusion. Left carotid system: Left common carotid artery patent from its origin to the bifurcation without stenosis. No significant atheromatous narrowing about the left bifurcation. Left ICA widely patent from the bifurcation to the skull base without stenosis, dissection, or occlusion. Vertebral arteries: Both of the vertebral arteries arise from the subclavian arteries. Vertebral arteries widely patent within the neck without stenosis, dissection, or occlusion. Skeleton: No acute osseous finding. No discrete lytic or blastic osseous lesions. Mild to moderate cervical spondylolysis at C4-5 through C6-7. Prominent right-sided facet arthrosis noted at C3-4. 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 Soft tissues of the neck demonstrate no other acute finding. Upper chest: Layering fluid noted within the upper esophageal lumen. Visualized upper chest demonstrates no other acute finding. Partially visualized lungs are clear. Review of the MIP images confirms the above findings CTA HEAD FINDINGS Anterior circulation: Petrous ICAs widely patent bilaterally. Cavernous and supraclinoid segments widely  patent without stenosis. 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 ICA termini well perfused distally. A1 segments patent bilaterally. Right A1 hypoplastic, likely accounting for the dominant left ICA as compared to the right. Normal anterior communicating artery. Anterior cerebral arteries widely patent to their distal aspects without stenosis. Right M1 widely patent without stenosis. Normal right MCA bifurcation. Distal right MCA branches well perfused. Left M1 patent proximally. Focal loss of  contrast opacification involving the mid left M1 segment, most consistent with a severe near occlusive stenosis (series 7, image 111). Upon review of prior MRI from 2010, this is likely chronic in nature. Stenosis measures approximately 3 mm in length. Left M1 patent distally. Normal left MCA bifurcation. Left MCA branches are opacified distally, although overall attenuated as compared to the right due to the severe M1 stenosis. Posterior circulation: Vertebral arteries patent to the vertebrobasilar junction without stenosis. Posterior inferior cerebral arteries patent bilaterally. Proximal and mid basilar artery diminutive but widely patent without stenosis. Persistent left-sided trigeminal artery supplies the distal basilar artery which is more robust in caliber. Superior cerebral arteries patent bilaterally. Both of the posterior cerebral arteries primarily supplied via the basilar and are well perfused to their distal aspects. Venous sinuses: Grossly patent, although limited in assessment due to arterial timing the contrast bolus. Anatomic variants: Persistent left trigeminal artery. No intracranial aneurysm. Delayed phase: Not performed. Review of the MIP images confirms the above findings CT Brain Perfusion Findings: CBF (<30%) Volume: 71mL Perfusion (Tmax>6.0s) volume: 133mL Mismatch Volume: 171mL Infarction Location:No core infarct identified by CT perfusion. Large perfusion mismatch throughout the left MCA territory due to the severe left M1 stenosis. IMPRESSION: 1. Negative CTA for large vessel occlusion. No core infarct evident by CT perfusion. 2. Short-segment severe mid left M1 stenosis, likely similar as compared to previous MRI from 11/19/2008. Associated delayed perfusion throughout the left MCA territory distally. 3. No other hemodynamically significant or correctable stenosis within the major arterial vasculature of the head and neck. 4. Persistent left trigeminal artery. Finding discussed with Dr.  Leonel Ramsay by telephone at approximately 9:45 p.m. on 08/30/2018. Electronically Signed   By: Jeannine Boga M.D.   On: 08/30/2018 22:59   Dg Chest 2 View  Result Date: 08/30/2018 CLINICAL DATA:  63 year old male with slurred speech and cough today. EXAM: CHEST - 2 VIEW COMPARISON:  08/27/2018 chest radiographs and earlier. FINDINGS: Lower lung volumes on both views. No pneumothorax, pulmonary edema, pleural effusion or consolidation. Mild basilar crowding of markings. Stable cardiac size at the upper limits of normal. Other mediastinal contours are within normal limits. Visualized tracheal air column is within normal limits. No acute osseous abnormality identified. Negative visible bowel gas pattern. Previous ventral abdominal hernia repair with mesh. IMPRESSION: Lower lung volumes, otherwise no acute cardiopulmonary abnormality. Electronically Signed   By: Genevie Ann M.D.   On: 08/30/2018 17:15   Ct Head Wo Contrast  Result Date: 08/30/2018 CLINICAL DATA:  63 year old male with abnormal speech onset today. EXAM: CT HEAD WITHOUT CONTRAST TECHNIQUE: Contiguous axial images were obtained from the base of the skull through the vertex without intravenous contrast. COMPARISON:  Brain MRI 11/19/2008. FINDINGS: Brain: A small area of right parietal lobe cortical encephalomalacia is redemonstrated on series 3, image 22. Cerebral volume has not significantly changed. New abnormal hypodensity in the left posterior limb internal capsule and/or lentiform (series 3, image 14). New hypodensity also in the left caudate and anterior lentiform on image 17. No acute intracranial hemorrhage identified.  No midline shift, mass effect, or evidence of intracranial mass lesion. No ventriculomegaly. No acute cortically based infarct identified. Incidental tentorial dural calcifications. Vascular: No suspicious intracranial vascular hyperdensity. Skull: Negative. Sinuses/Orbits: Right frontal ethmoid and maxillary sinus disease  with fluid levels is new since 2010. The right sphenoid and left paranasal sinuses are spared. Tympanic cavities and mastoids are clear. Other: Mildly Disconjugate gaze, otherwise negative orbits. Visualized scalp soft tissues are within normal limits. IMPRESSION: 1. 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 2. Right [facility] pattern paranasal sinusitis. Electronically Signed   By: Genevie Ann M.D.   On: 08/30/2018 17:39   Ct Angio Neck W Or Wo Contrast  Result Date: 08/30/2018 CLINICAL DATA:  Initial evaluation for acute speech difficulty, right facial weakness. EXAM: CT ANGIOGRAPHY HEAD AND NECK CT PERFUSION BRAIN TECHNIQUE: Multidetector CT imaging of the head and neck was performed using the standard protocol during bolus administration of intravenous contrast. Multiplanar CT image reconstructions and MIPs were obtained to evaluate the vascular anatomy. Carotid stenosis measurements (when applicable) are obtained utilizing NASCET criteria, using the distal internal carotid diameter as the denominator. Multiphase CT imaging of the brain was performed following IV bolus contrast injection. Subsequent parametric perfusion maps were calculated using RAPID software. CONTRAST:  172mL ISOVUE-370 IOPAMIDOL (ISOVUE-370) INJECTION 76% COMPARISON:  Prior noncontrast CT from earlier same day. FINDINGS: CTA NECK FINDINGS Aortic arch: Visualized aortic arch of normal caliber with normal branch pattern. No hemodynamically significant stenosis about the origin of the great vessels. Visualized subclavian arteries widely patent. Right carotid system: Right common carotid artery patent from its origin to the bifurcation without stenosis. Minimal mixed plaque about the right bifurcation without stenosis. Short-segment mild stenosis of approximately 20% by NASCET criteria just distally within the proximal right ICA. Right ICA otherwise widely patent to the skull base without stenosis, dissection,  or occlusion. Left carotid system: Left common carotid artery patent from its origin to the bifurcation without stenosis. No significant atheromatous narrowing about the left bifurcation. Left ICA widely patent from the bifurcation to the skull base without stenosis, dissection, or occlusion. Vertebral arteries: Both of the vertebral arteries arise from the subclavian arteries. Vertebral arteries widely patent within the neck without stenosis, dissection, or occlusion. Skeleton: No acute osseous finding. No discrete lytic or blastic osseous lesions. Mild to moderate cervical spondylolysis at C4-5 through C6-7. Prominent right-sided facet arthrosis noted at C3-4. 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 Soft tissues of the neck demonstrate no other acute finding. Upper chest: Layering fluid noted within the upper esophageal lumen. Visualized upper chest demonstrates no other acute finding. Partially visualized lungs are clear. Review of the MIP images confirms the above findings CTA HEAD FINDINGS Anterior circulation: Petrous ICAs widely patent bilaterally. Cavernous and supraclinoid segments widely patent without stenosis. 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 ICA termini well perfused distally. A1 segments patent bilaterally. Right A1 hypoplastic, likely accounting for the dominant left ICA as compared to the right. Normal anterior communicating artery. Anterior cerebral arteries widely patent to their distal aspects without stenosis. Right M1 widely patent without stenosis. Normal right MCA bifurcation. Distal right MCA branches well perfused. Left M1 patent proximally. 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 Upon review of prior MRI from 2010, this is likely chronic in nature. Stenosis measures approximately 3 mm in length. Left M1  patent distally. Normal  left MCA bifurcation. Left MCA branches are opacified distally, although overall attenuated as compared to the right due to the severe M1 stenosis. Posterior circulation: Vertebral arteries patent to the vertebrobasilar junction without stenosis. Posterior inferior cerebral arteries patent bilaterally. Proximal and mid basilar artery diminutive but widely patent without stenosis. Persistent left-sided trigeminal artery supplies the distal basilar artery which is more robust in caliber. Superior cerebral arteries patent bilaterally. Both of the posterior cerebral arteries primarily supplied via the basilar and are well perfused to their distal aspects. Venous sinuses: Grossly patent, although limited in assessment due to arterial timing the contrast bolus. Anatomic variants: Persistent left trigeminal artery. No intracranial aneurysm. Delayed phase: Not performed. Review of the MIP images confirms the above findings CT Brain Perfusion Findings: CBF (<30%) Volume: 38mL Perfusion (Tmax>6.0s) volume: 191mL Mismatch Volume: 144mL Infarction Location:No core infarct identified by CT perfusion. Large perfusion mismatch throughout the left MCA territory due to the severe left M1 stenosis. IMPRESSION: 1. Negative CTA for large vessel occlusion. No core infarct evident by CT perfusion. 2. Short-segment severe mid left M1 stenosis, likely similar as compared to previous MRI from 11/19/2008. Associated delayed perfusion throughout the left MCA territory distally. 3. No other hemodynamically significant or correctable stenosis within the major arterial vasculature of the head and neck. 4. Persistent left trigeminal artery. Finding discussed with Dr. Leonel Ramsay by telephone at approximately 9:45 p.m. on 08/30/2018. Electronically Signed   By: Jeannine Boga M.D.   On: 08/30/2018 22:59   Mr Brain Wo Contrast  Result Date: 08/30/2018 CLINICAL DATA:  63 y/o  M; right-sided weakness. EXAM: MRI HEAD WITHOUT CONTRAST  TECHNIQUE: Multiplanar, multiecho pulse sequences of the brain and surrounding structures were obtained without intravenous contrast. COMPARISON:  08/30/2018 CT head, CT perfusion head, and CTA head. FINDINGS: Brain: Multiple foci of reduced diffusion are present within the left caudate body, left mid and posterior corona radiata, left posterior limb of internal capsule extending into left putamen, as well as several additional small foci of the temporoparietal periventricular white matter and the left frontal operculum compatible with acute/early subacute infarction. No associated hemorrhage or mass effect. Infarcts are T2 FLAIR hyperintense. No acute hemorrhage. No extra-axial collection, hydrocephalus, mass effect, or herniation. Small chronic infarcts in the right parietal cortex and right occipital periventricular white matter. Mild chronic microvascular ischemic changes of the brain. Mild brain parenchymal volume loss. Vascular: Please refer to CT angiogram of the head and neck. Skull and upper cervical spine: Normal marrow signal. Sinuses/Orbits: Right frontal, ethmoid, and maxillary sinus partial opacification and fluid level. No abnormal signal of the mastoid air cells. Orbits are unremarkable. Other: None. IMPRESSION: 1. Multiple small foci of acute/early subacute infarction are present in the left MCA distribution concentrated in left basal ganglia inclusive of the corona radiata and posterior limb of internal capsule. No hemorrhage or mass effect. 2. Mild chronic microvascular ischemic changes and volume loss of the brain. Small chronic infarcts in right parietal and occipital lobes. 3. Right frontal, ethmoid, and maxillary sinus disease is a right middle meatus obstructive pattern, direct visualization recommended. These results were called by telephone at the time of interpretation on 08/30/2018 at 10:46 pm to Dr. Leonel Ramsay, who verbally acknowledged these results. Electronically Signed   By: Kristine Garbe M.D.   On: 08/30/2018 22:48   Ct Cerebral Perfusion W Contrast  Result Date: 08/30/2018 CLINICAL DATA:  Initial evaluation for acute speech difficulty, right facial weakness. EXAM: CT ANGIOGRAPHY HEAD AND  NECK CT PERFUSION BRAIN TECHNIQUE: Multidetector CT imaging of the head and neck was performed using the standard protocol during bolus administration of intravenous contrast. Multiplanar CT image reconstructions and MIPs were obtained to evaluate the vascular anatomy. Carotid stenosis measurements (when applicable) are obtained utilizing NASCET criteria, using the distal internal carotid diameter as the denominator. Multiphase CT imaging of the brain was performed following IV bolus contrast injection. Subsequent parametric perfusion maps were calculated using RAPID software. CONTRAST:  169mL ISOVUE-370 IOPAMIDOL (ISOVUE-370) INJECTION 76% COMPARISON:  Prior noncontrast CT from earlier same day. FINDINGS: CTA NECK FINDINGS Aortic arch: Visualized aortic arch of normal caliber with normal branch pattern. No hemodynamically significant stenosis about the origin of the great vessels. Visualized subclavian arteries widely patent. Right carotid system: Right common carotid artery patent from its origin to the bifurcation without stenosis. Minimal mixed plaque about the right bifurcation without stenosis. Short-segment mild stenosis of approximately 20% by NASCET criteria just distally within the proximal right ICA. Right ICA otherwise widely patent to the skull base without stenosis, dissection, or occlusion. Left carotid system: Left common carotid artery patent from its origin to the bifurcation without stenosis. No significant atheromatous narrowing about the left bifurcation. Left ICA widely patent from the bifurcation to the skull base without stenosis, dissection, or occlusion. Vertebral arteries: Both of the vertebral arteries arise from the subclavian arteries. Vertebral arteries widely  patent within the neck without stenosis, dissection, or occlusion. Skeleton: No acute osseous finding. No discrete lytic or blastic osseous lesions. Mild to moderate cervical spondylolysis at C4-5 through C6-7. Prominent right-sided facet arthrosis noted at C3-4. 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 Soft tissues of the neck demonstrate no other acute finding. Upper chest: Layering fluid noted within the upper esophageal lumen. Visualized upper chest demonstrates no other acute finding. Partially visualized lungs are clear. Review of the MIP images confirms the above findings CTA HEAD FINDINGS Anterior circulation: Petrous ICAs widely patent bilaterally. Cavernous and supraclinoid segments widely patent without stenosis. 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 ICA termini well perfused distally. A1 segments patent bilaterally. Right A1 hypoplastic, likely accounting for the dominant left ICA as compared to the right. Normal anterior communicating artery. Anterior cerebral arteries widely patent to their distal aspects without stenosis. Right M1 widely patent without stenosis. Normal right MCA bifurcation. Distal right MCA branches well perfused. Left M1 patent proximally. 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 Upon review of prior MRI from 2010, this is likely chronic in nature. Stenosis measures approximately 3 mm in length. Left M1 patent distally. Normal left MCA bifurcation. Left MCA branches are opacified distally, although overall attenuated as compared to the right due to the severe M1 stenosis. Posterior circulation: Vertebral arteries patent to the vertebrobasilar junction without stenosis. Posterior inferior cerebral arteries patent bilaterally. Proximal and mid basilar artery diminutive but widely patent without stenosis. Persistent left-sided  trigeminal artery supplies the distal basilar artery which is more robust in caliber. Superior cerebral arteries patent bilaterally. Both of the posterior cerebral arteries primarily supplied via the basilar and are well perfused to their distal aspects. Venous sinuses: Grossly patent, although limited in assessment due to arterial timing the contrast bolus. Anatomic variants: Persistent left trigeminal artery. No intracranial aneurysm. Delayed phase: Not performed. Review of the MIP images confirms the above findings CT Brain Perfusion Findings: CBF (<30%) Volume: 1mL Perfusion (Tmax>6.0s) volume:  131mL Mismatch Volume: 121mL Infarction Location:No core infarct identified by CT perfusion. Large perfusion mismatch throughout the left MCA territory due to the severe left M1 stenosis. IMPRESSION: 1. Negative CTA for large vessel occlusion. No core infarct evident by CT perfusion. 2. Short-segment severe mid left M1 stenosis, likely similar as compared to previous MRI from 11/19/2008. Associated delayed perfusion throughout the left MCA territory distally. 3. No other hemodynamically significant or correctable stenosis within the major arterial vasculature of the head and neck. 4. Persistent left trigeminal artery. Finding discussed with Dr. Leonel Ramsay by telephone at approximately 9:45 p.m. on 08/30/2018. Electronically Signed   By: Jeannine Boga M.D.   On: 08/30/2018 22:59      Scheduled Meds: . [START ON 09/01/2018] alfuzosin  10 mg Oral Once per day on Mon Wed Fri  . [START ON 09/01/2018] aspirin EC  81 mg Oral Daily  . atorvastatin  40 mg Oral q1800  . clopidogrel  75 mg Oral Daily  . enoxaparin (LOVENOX) injection  40 mg Subcutaneous Q24H  . FLUoxetine  20 mg Oral Daily  . multivitamin with minerals  1 tablet Oral Daily   Continuous Infusions:   LOS: 0 days      Debbe Odea, MD [facility] Pager: [email or web address] Password [email or web address] 08/31/2018, 1:57 PM

## 2018-08-31 NOTE — Progress Notes (Signed)
Patient admitted to room 3W-02, patient alert x4, no complaints, patient oriented to room, advised importance of calling for assistance before getting out of bed.  Call light within reach

## 2018-08-31 NOTE — Evaluation (Signed)
Speech Language Pathology Evaluation Patient Details Name: Edward Glass MRN: [id number] DOB: [birth year] Today's Date: 08/31/2018 Time: 2706-2376 SLP Time Calculation (min) (ACUTE ONLY): 33 min  Problem List:  Patient Active Problem List   Diagnosis Date Noted  . CVA (cerebral vascular accident) ([place]) 08/31/2018  . Elevated aspartate aminotransferase level 06/16/2015  . H/O nutritional disorder 06/16/2015  . Excessive salivation 06/16/2015  . 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 Heart palpitations 06/16/2015  . 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 Screening for prostate cancer 08/21/2014  . Atypical chest pain 08/10/2013  . 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 Atypical chest pain 08/10/2013  . Elevated aspartate aminotransferase level 06/16/2015  . Excessive salivation 06/16/2015  . H/O nutritional disorder 06/16/2015  . 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 Screening for prostate cancer 08/21/2014  . 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 HERNIA REPAIR  2006  . left tricep surgery     HPI:  Pt is a 63 y.o. male with medical history significant of hypogonadism, possible BPH, who presents with confusion, difficulty speaking, right facial droop. 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    Assessment / Plan / Recommendation Clinical Impression  Pt was seen for speech/language evaluation with his family present. The pt was alert but unable to provide any history due to aphasia. The pt's family denied any prior speech/language/cognitive deficits. Pt presents with moderate-severe non-fluent aphasia. He was able to follow  simple 1-step commands and answer simple yes/no questions but he demonstrated increased difficulty with auditory comprehension as the complexity increased above those levels. He exhibited 40% accuracy with confrontational naming but was unable to demonstrate responsive naming and exhibited notable difficulty with sentence completion and sentence repetition. He produced some social automatic responses but exhibited difficulty with automatic sequences beyond counting. Deficits were also noted in reading comprehension but he appeared to comprehend some individual words. Pt also presents with moderate-severe dysarthria characterized by reduced articulatory precision secondary to oral motor weakness. Skilled SLP services are clinically indicated at this time for aphasia and dysarthria intervention.     SLP Assessment  SLP Recommendation/Assessment: Patient needs continued Speech Lanaguage Pathology Services SLP Visit Diagnosis: Dysarthria and anarthria (R47.1);Aphasia (R47.01)    Follow Up Recommendations  Inpatient Rehab    Frequency and Duration min 2x/week  2 weeks      SLP Evaluation Cognition  Overall Cognitive Status: Difficult to assess(Due to aphasia) Arousal/Alertness: Awake/alert Orientation Level: Oriented X4       Comprehension  Auditory Comprehension Overall Auditory Comprehension: Impaired Yes/No Questions: Impaired Other Yes/No Questions Comments`: (SImple: 5/5; Complex 2/5) Commands: Impaired One Step Basic Commands: (4/5) Two Step Basic Commands: (2/4) Multistep Basic Commands: Not tested Conversation: Simple EffectiveTechniques: Extra processing time;Repetition;Slowed speech Reading Comprehension Reading Status: Impaired Word level: Impaired Sentence Level: Impaired Paragraph Level: Impaired    Expression Verbal Expression Overall Verbal Expression: Impaired Initiation: Impaired Automatic Speech: Counting(Counting: 10/10; Days & Months: mod-max cues needed  ) Level of Generative/Spontaneous Verbalization: Word Repetition: Impaired Level of Impairment: Phrase level Naming: Impairment Responsive: (0/5) Confrontation: (4/10) Convergent: (Sentence Completion: 1/5) Divergent: Not tested Verbal Errors: Perseveration;Semantic paraphasias Pragmatics: No impairment Interfering Components: Speech  intelligibility Effective Techniques: Articulatory cues   Oral / Motor  Oral Motor/Sensory Function Overall Oral Motor/Sensory Function: Moderate impairment Facial ROM: Reduced right;Suspected CN VII (facial) dysfunction Facial Symmetry: Abnormal symmetry right;Suspected CN VII (facial) dysfunction Facial Strength: Reduced right Facial Sensation: Within Functional Limits Lingual ROM: Within Functional Limits Lingual Symmetry: Within Functional Limits Lingual Strength: Reduced;Suspected CN XII (hypoglossal) dysfunction Lingual Sensation: Within Functional Limits Mandible: Within Functional Limits Motor Speech Overall Motor Speech: Impaired Respiration: Within functional limits Phonation: Low vocal intensity;Breathy Resonance: Within functional limits Articulation: Impaired Level of Impairment: Word Intelligibility: Intelligibility reduced Word: 50-74% accurate Phrase: 25-49% accurate Sentence: 25-49% accurate Motor Planning: Witnin functional limits Motor Speech Errors: Aware;Consistent Effective Techniques: Slow rate;Increased vocal intensity;Over-articulate   Owenn Rothermel I. Hardin Negus, [place], [place] Office number [phone] Pager [phone]           Horton Marshall 08/31/2018, 2:50 PM

## 2018-08-31 NOTE — PMR Pre-admission (Addendum)
PMR Admission Coordinator Pre-Admission Assessment  Patient: Edward Glass is an 63 y.o., male MRN: [id number] DOB: September 10, 1955 Height: 5' 5"  (165.1 cm) Weight: 79.3 kg              Insurance Information HMO:     PPO: Yes     PCP:      IPA:      80/20:      OTHER:  PRIMARY: BCBS      Policy#: [id number]      Subscriber: Patient CM Name: Edward Glass      Phone#: [phone]     Fax#: [phone] Pre-Cert#: [id number]      Employer: Patient Auth provided by Edward Glass on 09/04/18 for admit to CIR. Effective 2/11-2/18; Clinical updates due to [place] on 09/11/18.  Benefits:  Phone #: NA    Name: Edward Glass Eff. Date: 07/26/18     Deduct: $7,500 (met $0)      Out of Pocket Max: $8,150 (Met $0)      Life Max: NA CIR: 60%/40%      SNF: 60%/40%, 60 day limit Outpatient: 30/habil PT/OT, 30/Rehab PT/OT, 30/Rehab ST, 30/Habil ST     Co-Pay: $115/visit Home Health: 60%, per necessity ("cont. Improvement w/ pre cert necessary)     Co-Pay: 40% DME: 60%     Co-Pay: 40% Providers:  SECONDARY:       Policy#:       Subscriber:  CM Name:       Phone#:      Fax#:  Pre-Cert#:       Employer:  Benefits:  Phone #:      Name:  Eff. 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 Pt was admitted on 08/30/2018 with right-sided weakness/fall and slurred speech. An  MRI showed multi small foci of acute early subacute infarction present in the left MCA distribution concentrated in the left basal ganglia. Pt had a change in functional status on 09/01/2018 with increase weakness of the right side and dysarthria; a follow up CT scan showed more confluent acute infarct in the left basal ganglia and corona radiata when compared to diffusion imaging 2 days prior. No new distribution infarction or hemorrhagic conversion. Modified barium swallow completed presently on a dysphagia #2 nectar thick liquid diet. TEE and loop recorder completed 09/04/2018. Patient with x-rays of right hand after recent fall related to CVA showed possible fracture of the triquetrum with conservative care per orthopedic services Dr. Stann Mainland and weightbearing as tolerated and placed in a splint. Therapies have recommended CIR. Patient was admitted for a comprehensive rehabilitation program on 09/05/18.   Complete NIHSS TOTAL:  12    Past Medical History  Past Medical History:  Diagnosis Date  . Atypical chest pain 08/10/2013  . Elevated aspartate aminotransferase level 06/16/2015  . Excessive salivation 06/16/2015  . H/O nutritional disorder 06/16/2015  . 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 Screening for prostate cancer 08/21/2014  . 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  Prior Rehab/Hospitalizations:  Has the patient had major surgery during 100 days prior to admission? No  Current Medications   Current Facility-Administered Medications:  .  acetaminophen (TYLENOL) tablet 650 mg, 650 mg, Oral, Q4H PRN **OR** acetaminophen (TYLENOL) solution 650 mg, 650 mg, Per Tube, Q4H PRN **OR** acetaminophen (TYLENOL) suppository 650 mg, 650 mg, Rectal, Q4H PRN, Jerline Pain, MD .  alfuzosin (UROXATRAL) 24 hr tablet 10 mg,  10 mg, Oral, Once per day on Mon Wed Fri, Skains, Mark C, MD .  aspirin EC tablet 325 mg, 325 mg, Oral, Daily, Evans Lance, MD .  atorvastatin (LIPITOR) tablet 40 mg, 40 mg, Oral, q1800, Jerline Pain, MD, 40 mg at 09/03/18 1848 .  clopidogrel (PLAVIX) tablet 75 mg, 75 mg, Oral, Daily, Jerline Pain, MD, 75 mg at 09/03/18 0905 .  enoxaparin (LOVENOX) injection 40 mg, 40 mg, Subcutaneous, Q24H, Jerline Pain, MD, 40 mg at 09/03/18 2047 .  food thickener (THICK IT) powder, , Oral, PRN, Jerline Pain, MD .  multivitamin with minerals tablet 1 tablet, 1 tablet, Oral, Daily, Jerline Pain, MD, 1 tablet at 09/03/18 0905 .  ondansetron (ZOFRAN) injection 4 mg, 4 mg, Intravenous, Q6H PRN, Evans Lance, MD .  senna-docusate (Senokot-S) tablet 1 tablet, 1 tablet, Oral, QHS PRN, Jerline Pain, MD, 1 tablet at 09/03/18 2047 .  sodium phosphate (FLEET) 7-19 GM/118ML enema 1 enema, 1 enema, Rectal, Daily PRN, Jerline Pain, MD  Patients Current Diet:  Diet Order            DIET DYS 2 Room service appropriate? 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 None of the above  Prior Functional Level Prior Function Level of Independence: Independent Comments: works as a Comptroller Care: Did the patient need help bathing, dressing, using the toilet or eating?  Independent  Indoor Mobility: Did the  patient need assistance with walking from room to room (with or without device)? Independent  Stairs: Did the patient need assistance with internal or external stairs (with or without device)? Independent  Functional Cognition: Did the patient need help planning regular tasks such as shopping or remembering to take medications? Independent  Current Functional Level Cognition  Arousal/Alertness: Awake/alert Overall Cognitive Status: Impaired/Different from baseline Difficult to assess due to: Impaired communication Orientation Level:  Oriented X4 Following Commands: Follows one step commands inconsistently(receptive difficulties vs. apraxia) General Comments: difficult to assess due to aphasia; pt able to follow simple one-step commands but required frequent verbal and tactile cueing    Extremity Assessment (includes Sensation/Coordination)  Upper Extremity Assessment: RUE deficits/detail RUE Deficits / Details: 3+/5 grossly, some possible R side neglect. Family reports h/o issues with R shoulder. difficulty maintaining full grasp on rw. RUE Sensation: decreased proprioception RUE Coordination: decreased fine motor, decreased gross motor  Lower Extremity Assessment: Defer to PT evaluation [name] Deficits / Details: grossly 3+/5 [name] Sensation: decreased proprioception LLE Deficits / Details: grossly 5/5 LLE Sensation: WNL    ADLs  Overall ADL's : Needs assistance/impaired Eating/Feeding: Minimal assistance, Modified independent, Sitting Eating/Feeding Details (indicate cue type and reason): provided built-up foam for use with feeding utensils Grooming: Sitting, Moderate assistance Upper Body Bathing: Moderate assistance, Sitting Lower Body Bathing: Maximal assistance, Sit to/from stand Upper Body Dressing : Maximal assistance, Sitting Lower Body Dressing: Maximal assistance Toilet Transfer: Maximal assistance, Stand-pivot Toilet Transfer Details (indicate cue type and reason): walked  halfway to bathroom this session Toileting- Clothing Manipulation and Hygiene: Maximal assistance Tub/ Shower Transfer: Moderate assistance, +2 for physical assistance Functional mobility during ADLs: Moderate assistance, +2 for physical assistance, +2 for safety/equipment General ADL Comments: Pt still with significant right UE and LE hemiparesis.  He also demonstrates right inattention and neglect.  Mod assist for rolling to the right side with max assist for sidelying to sit EOB.  He was able to maintain static sitting balance with min assist for dynamic sitting balance while performing weightbearing tasks through the RUE and reaching to the right across his body with the LUE.  Max assist for sit to stand with max assist for taking 4-5 steps up toward the tops of the bed.  Pt with increased right wrist pain with flexion so wrist cock-up splint donned for support.  Educated wife on positioning of the splint and donning/doffing.  Also educated her on PROM exercises for the RUE shoulder, elbow, forearm, and fingers.  Pt returned to bed with mod assist in preparation for transfer for TEE.      Mobility  Overal bed mobility: Needs Assistance Bed Mobility: Rolling Rolling: Max assist, Mod assist Supine to sit: Max assist Sit to supine: Mod assist General bed mobility comments: Performed x 5 reps of rolling to L side.  Pt required Hand over hand placement to assist R UE.  Pt required cues to look R and PTA assisted [name] into hooklying to push through R LE to roll to the Left side.  mod-max assistance.      Transfers  Overall transfer level: Needs assistance Equipment used: 2 person hand held assist Transfers: Sit to/from Stand, Stand Pivot Transfers Sit to Stand: Mod assist Stand pivot transfers: Max assist General transfer comment: pt progressing from mod A x2 to min A with sit<>stands; therapist blocking R knee throughout; pt with very poor eccentric control to return to sitting    Ambulation /  Gait / Stairs / Wheelchair Mobility  Ambulation/Gait Ambulation/Gait assistance: Mod assist, +2 physical assistance, +2 safety/equipment Gait Distance (Feet): 20 Feet Assistive device: 1 person hand held assist Gait Pattern/deviations: Step-to pattern, Decreased step length - right, Decreased step length - left, Decreased stance time - right, Decreased weight shift to right, Narrow base of support General Gait Details: pt only able to take a few pivotal steps towards his L side with mod-max A x2 Gait velocity: decreased  Posture / Balance Dynamic Sitting Balance Sitting balance - Comments: Pt able to maintain upright midline sitting at EOB with supervision, min assist when completing reaching tasks Balance Overall balance assessment: Needs assistance Sitting-balance support: Feet supported Sitting balance-Leahy Scale: Fair Sitting balance - Comments: Pt able to maintain upright midline sitting at EOB with supervision, min assist when completing reaching tasks Standing balance support: Single extremity supported, During functional activity Standing balance-Leahy Scale: Poor Standing balance comment: Mod assist for static standing balance with max assist for stepping.    Special needs/care consideration BiPAP/CPAP: no CPM: no Continuous Drip IV: no Dialysis: no        Days: no Life Vest: no Oxygen: no Special Bed: no Trach Size :no Wound Vac (area): no      Location:NA Skin: no areas of concern                  Bowel mgmt: continent, last BM 08/29/18 Bladder mgmt: continent Diabetic mgmt: No     Previous Home Environment Living Arrangements: Alone Available Help at Discharge: Family Type of Home: House Home Layout: Two level Alternate Level Stairs-Rails: Left Alternate Level Stairs-Number of Steps: 10 Home Access: Stairs to enter Entrance Stairs-Rails: None Entrance Stairs-Number of Steps: 2 Bathroom Shower/Tub: Multimedia programmer: Standard  Discharge Living  Setting Plans for Discharge Living Setting: Patient's home  Type of Home at Discharge: House Discharge Home Layout: Two level Alternate Level Stairs-Rails: None(NA) Alternate Level Stairs-Number of Steps: NA Discharge Home Access: Stairs to enter Entrance Stairs-Rails: None Entrance Stairs-Number of Steps: 1 Discharge Bathroom Shower/Tub: Horticulturist, commercial: Standard Discharge Bathroom Accessibility: Yes How Accessible: Accessible via walker Does the patient have any problems obtaining your medications?: No  *Another option if needed is for pt to return home to daughter's [name]) house. She has a one story home with 5 steps to enter and right and left hand rail, tub shower, standard height toilet, and a RW accessible bathroom. Pt's daughter is open to having ramp built.   Social/Family/Support Systems Patient Roles: Other (Comment)(personal trainor; has significant other) Contact Information: Jeneen Rinks (brother): [phone]; Lennie Odor (significant other): [phone]; Santiago Glad (Sister in law): [phone]; [name] (Daughter): [phone]; Jonelle Sidle (daughter): [phone] Anticipated Caregiver: family ([name] to work from home per pt's brother) all family to pitch in for 24/7 A Anticipated Caregiver's Contact Information: see above Ability/Limitations of Caregiver: Supervision/Min A Caregiver Availability: 24/7 Discharge Plan Discussed with Primary Caregiver: Yes Is Caregiver In Agreement with Plan?: Yes Does Caregiver/Family have Issues with Lodging/Transportation while Pt is in Rehab?: No   Goals/Additional Needs Patient/Family Goal for Rehab: PT: Min A, OT: Min/Mod A SLP: Supervision/Min A Expected length of stay: 15-20 days Cultural Considerations: NA Dietary Needs: Heart Healthy, thin liquids Equipment Needs: TBD Pt/Family Agrees to Admission and willing to participate: Yes Program Orientation Provided & Reviewed with Pt/Caregiver Including  Roles  & Responsibilities: Yes(pt and family)  Barriers to Discharge: Home environment access/layout  Barriers to Discharge Comments: stairs to enter home   Decrease burden of Care through IP rehab admission: NA   Possible need for SNF placement upon discharge: Not anticipated; pt has good family support and goals for Supervision. Pt has an excellent prognosis for further recovery through CIR.    Patient Condition: This patient's medical and functional status has changed since the consult dated: 08/31/18 in which the Rehabilitation Physician determined and documented that the patient's condition is appropriate for intensive rehabilitative care in an inpatient rehabilitation facility. See "  History of Present Illness" (above) for medical update. Functional changes are: functional decline in bed mobility from Min A to Max A, transfers from Mod A of 1 person HH A to Mod A of 2 people HHA, and a decline in ability to ambulate form Mod A x2 for 20 feet to only pivotal steps and Mod/Max Ax2. Patient's medical and functional status update has been discussed with the Rehabilitation physician and patient remains appropriate for inpatient rehabilitation. Will admit to inpatient rehab today.  Preadmission Screen Completed By:  Jhonnie Garner, 09/05/2018 7:31 AM ______________________________________________________________________   Discussed status with Dr. Naaman Plummer on 09/05/18 at 7:31AM and received telephone approval for admission today.  Admission Coordinator:  Jhonnie Garner, time 7:31AM/Date 09/05/18

## 2018-08-31 NOTE — Consult Note (Signed)
Physical Medicine and Rehabilitation Consult Reason for Consult: Right facial droop and right side weakness Referring Physician:  [facility]   HPI: Edward Glass is a 63 y.o.right handed male with history of hypogonadism, BPH. Per chart review patient lives alone. 2 level home to steps to entry. Independent prior to admission. Patient has worked as a Radio producer. He does have family in the area.Presented to 12/13/2018 right side weakness and slurred speech. Cranial CT scan showed age indeterminate small vessel ischemia in the left basal ganglia new since 2010. CT perfusion scan as well as CTA head and neck showed no large vessel occlusion. 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 Patient did not receive TPA. Echocardiogram pending. Presently on aspirin and Plavix for CVA prophylaxis. Subcutaneous Lovenox for DVT prophylaxis. AWAIT PLAN FOR tee AND LOOP RECORDER. Tolerating a regular diet. Therapy evaluation completed with recommendations of physical medicine rehabilitation consult.   Review of Systems  Constitutional: Negative for chills and fever.  HENT: Negative for hearing loss.   Eyes: Negative for blurred vision and double vision.  Respiratory: Negative for cough and shortness of breath.   Cardiovascular: Negative for chest pain, palpitations and leg swelling.  Gastrointestinal: Positive for constipation. Negative for nausea and vomiting.  Genitourinary: Positive for urgency. Negative for hematuria.  Skin: Negative for rash.  Neurological: Positive for speech change and focal weakness.  All other systems reviewed and are negative.  Past Medical History:  Diagnosis Date  . Atypical chest pain 08/10/2013  . Elevated aspartate aminotransferase level 06/16/2015  . Excessive salivation 06/16/2015  . H/O nutritional disorder 06/16/2015  . 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 Screening for prostate cancer 08/21/2014  . 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 HERNIA REPAIR  2006  . left tricep surgery     Family History  Problem Relation Age of Onset  . Kidney failure Mother   . Cancer Mother   . Prostate cancer Father   . Cancer Father   . Diabetes Father   . Colon cancer Neg Hx   . Esophageal cancer Neg Hx   . Rectal cancer Neg Hx   . Stomach cancer Neg Hx    Social History:  reports that he has never smoked. He has never used smokeless tobacco. He reports that he does not drink alcohol or use drugs. Allergies: No Known Allergies Medications Prior to Admission  Medication Sig Dispense Refill  . alfuzosin (UROXATRAL) 10 MG 24 hr tablet Take 10 mg by mouth 3 (three) times a week.    Marland Kitchen aspirin 81 MG tablet Take 81 mg by mouth daily.    Marland Kitchen MILK THISTLE PO Take 2 tablets by mouth daily.    . Misc Natural Products (SAW PALMETTO) CAPS Take 2-4 capsules by mouth daily.    . multivitamin (ONE-A-DAY MEN'S) TABS tablet Take 1 tablet by mouth daily.    . sildenafil (REVATIO) 20 MG tablet 2-5 pills as needed for ED symptoms (Patient not taking: Reported on 08/30/2018) 50 tablet 5    Home: [place] expects to be discharged to:: Private residence Living Arrangements: Alone Available Help at Discharge: Family Type of Home: House Home Access: Stairs to enter Technical brewer of Steps: 2 Entrance Stairs-Rails: None Home Layout: Two level Alternate Level Stairs-Number of Steps: 10 Alternate Level Stairs-Rails: Left  Bathroom Shower/Tub: Multimedia programmer: Standard Home Equipment: Careers adviser History: Prior Function Level of Independence: Independent Comments: works as a Psychologist, occupational Status:  Mobility: Bed Mobility Overal bed mobility: Needs Assistance Bed Mobility: Supine to Sit, Sit to Supine Supine to sit: Min  assist Sit to supine: Min assist General bed mobility comments: increased time and effort by patient; disuse of R UE to assist Transfers Overall transfer level: Needs assistance Equipment used: 1 person hand held assist Transfers: Sit to/from Stand, Stand Pivot Transfers Sit to Stand: Mod assist Stand pivot transfers: Mod assist General transfer comment: heavy Mod A to rise from bed and for stand pivot transfer; patient with heavy use of  L LE to power up; cueing for weight shifting; initially with posterior LOB with use of LE on bed frame - Mod A to maintain upright standing balance Ambulation/Gait Ambulation/Gait assistance: Mod assist, +2 physical assistance, +2 safety/equipment Gait Distance (Feet): 20 Feet Assistive device: 1 person hand held assist Gait Pattern/deviations: Step-to pattern, Decreased step length - right, Decreased step length - left, Decreased stance time - right, Decreased weight shift to right, Narrow base of support General Gait Details: Heavy Mod A for gait with 1 HHA at LUE; poor balance throughout; required chair follow; noted poor weight shift to R LE as well as limited hip flexion needed for swing phase of gait Gait velocity: decreased    ADL:    Cognition: Cognition Overall Cognitive Status: Difficult to assess Orientation Level: Oriented X4 Cognition Arousal/Alertness: Awake/alert Behavior During Therapy: Flat affect Overall Cognitive Status: Difficult to assess Difficult to assess due to: Impaired communication  Blood pressure 111/65, pulse 61, temperature 98 F (36.7 C), temperature source Oral, resp. rate 16, height 5\' 5"  (1.651 m), weight 79.3 kg, SpO2 96 %. Physical Exam  Vitals reviewed. HENT:  Mild facial weakness  Eyes: EOM are normal.  Neck: Normal range of motion. Neck supple. No thyromegaly present.  Cardiovascular: Normal rate and regular rhythm.  Respiratory: Effort normal and breath sounds normal. No respiratory distress.  GI:  Soft. Bowel sounds are normal. He exhibits no distension. There is no abdominal tenderness.  Neurological: He is alert.  Alert. Mood is flat but appropriate. He was able to state his name appears to have a component of aphasia. Patient able to state hospital, unable to explain reason for hospitalization. Naming objects is impaired he is able to name ring and stethoscope but not watch Sensation difficult to assess secondary to aphasia and dysarthria. Hypophonic dysarthria moderately severe Motor strength is 3- at the right deltoid bicep tricep finger flexors and extensors.  He is able to follows finger to thumb on the right hand Right lower extremity 3- at the hip flexors knee extensors and ankle dorsiflexors and plantar flexors 5/5 on the left side   Skin: Skin is warm and dry.  Psychiatric: He has a normal mood and affect.    Results for orders placed or performed during the hospital encounter of 08/30/18 (from the past 24 hour(s))  Comprehensive metabolic panel     Status: Abnormal   Collection Time: 08/30/18  3:46 PM  Result Value Ref Range   Sodium 140 135 - 145 mmol/L   Potassium 3.7 3.5 - 5.1 mmol/L   Chloride 105 98 - 111 mmol/L   CO2 23 22 - 32 mmol/L   Glucose, Bld 100 (H) 70 - 99 mg/dL   BUN 17 8 - 23 mg/dL   Creatinine, Ser 0.91 0.61 -  1.24 mg/dL   Calcium 9.4 8.9 - 10.3 mg/dL   Total Protein 7.1 6.5 - 8.1 g/dL   Albumin 3.8 3.5 - 5.0 g/dL   AST 40 15 - 41 U/L   ALT 35 0 - 44 U/L   Alkaline Phosphatase 65 38 - 126 U/L   Total Bilirubin 0.4 0.3 - 1.2 mg/dL   GFR calc non Af Amer >60 >60 mL/min   GFR calc Af Amer >60 >60 mL/min   Anion gap 12 5 - 15  CBC with Differential     Status: None   Collection Time: 08/30/18  3:46 PM  Result Value Ref Range   WBC 6.5 4.0 - 10.5 K/uL   RBC 5.28 4.22 - 5.81 MIL/uL   Hemoglobin 15.4 13.0 - 17.0 g/dL   HCT 48.5 39.0 - 52.0 %   MCV 91.9 80.0 - 100.0 fL   MCH 29.2 26.0 - 34.0 pg   MCHC 31.8 30.0 - 36.0 g/dL   RDW 12.7 11.5 -  15.5 %   Platelets 269 150 - 400 K/uL   nRBC 0.0 0.0 - 0.2 %   Neutrophils Relative % 59 %   Neutro Abs 3.8 1.7 - 7.7 K/uL   Lymphocytes Relative 33 %   Lymphs Abs 2.2 0.7 - 4.0 K/uL   Monocytes Relative 7 %   Monocytes Absolute 0.5 0.1 - 1.0 K/uL   Eosinophils Relative 1 %   Eosinophils Absolute 0.1 0.0 - 0.5 K/uL   Basophils Relative 0 %   Basophils Absolute 0.0 0.0 - 0.1 K/uL   Immature Granulocytes 0 %   Abs Immature Granulocytes 0.01 0.00 - 0.07 K/uL  Ethanol     Status: None   Collection Time: 08/30/18  4:57 PM  Result Value Ref Range   Alcohol, Ethyl (B) <10 <10 mg/dL  Urinalysis, Routine w reflex microscopic     Status: Abnormal   Collection Time: 08/30/18  6:01 PM  Result Value Ref Range   Color, Urine STRAW (A) YELLOW   APPearance CLEAR CLEAR   Specific Gravity, Urine 1.014 1.005 - 1.030   pH 5.0 5.0 - 8.0   Glucose, UA NEGATIVE NEGATIVE mg/dL   Hgb urine dipstick SMALL (A) NEGATIVE   Bilirubin Urine NEGATIVE NEGATIVE   Ketones, ur NEGATIVE NEGATIVE mg/dL   Protein, ur NEGATIVE NEGATIVE mg/dL   Nitrite NEGATIVE NEGATIVE   Leukocytes, UA NEGATIVE NEGATIVE   RBC / HPF 0-5 0 - 5 RBC/hpf   WBC, UA 0-5 0 - 5 WBC/hpf   Bacteria, UA NONE SEEN NONE SEEN   Mucus PRESENT   Urine rapid drug screen (hosp performed)     Status: None   Collection Time: 08/30/18  6:17 PM  Result Value Ref Range   Opiates NONE DETECTED NONE DETECTED   Cocaine NONE DETECTED NONE DETECTED   Benzodiazepines NONE DETECTED NONE DETECTED   Amphetamines NONE DETECTED NONE DETECTED   Tetrahydrocannabinol NONE DETECTED NONE DETECTED   Barbiturates NONE DETECTED NONE DETECTED  Hemoglobin A1c     Status: Abnormal   Collection Time: 08/31/18  4:47 AM  Result Value Ref Range   Hgb A1c MFr Bld 6.2 (H) 4.8 - 5.6 %   Mean Plasma Glucose 131.24 mg/dL  Lipid panel     Status: Abnormal   Collection Time: 08/31/18  4:47 AM  Result Value Ref Range   Cholesterol 173 0 - 200 mg/dL   Triglycerides 86 <150  mg/dL   HDL 52 >40 mg/dL   Total CHOL/HDL  Ratio 3.3 RATIO   VLDL 17 0 - 40 mg/dL   LDL Cholesterol 104 (H) 0 - 99 mg/dL   Ct Angio Head W Or Wo Contrast  Result Date: 08/30/2018 CLINICAL DATA:  Initial evaluation for acute speech difficulty, right facial weakness. EXAM: CT ANGIOGRAPHY HEAD AND NECK CT PERFUSION BRAIN TECHNIQUE: Multidetector CT imaging of the head and neck was performed using the standard protocol during bolus administration of intravenous contrast. Multiplanar CT image reconstructions and MIPs were obtained to evaluate the vascular anatomy. Carotid stenosis measurements (when applicable) are obtained utilizing NASCET criteria, using the distal internal carotid diameter as the denominator. Multiphase CT imaging of the brain was performed following IV bolus contrast injection. Subsequent parametric perfusion maps were calculated using RAPID software. CONTRAST:  133mL ISOVUE-370 IOPAMIDOL (ISOVUE-370) INJECTION 76% COMPARISON:  Prior noncontrast CT from earlier same day. FINDINGS: CTA NECK FINDINGS Aortic arch: Visualized aortic arch of normal caliber with normal branch pattern. No hemodynamically significant stenosis about the origin of the great vessels. Visualized subclavian arteries widely patent. Right carotid system: Right common carotid artery patent from its origin to the bifurcation without stenosis. Minimal mixed plaque about the right bifurcation without stenosis. Short-segment mild stenosis of approximately 20% by NASCET criteria just distally within the proximal right ICA. Right ICA otherwise widely patent to the skull base without stenosis, dissection, or occlusion. Left carotid system: Left common carotid artery patent from its origin to the bifurcation without stenosis. No significant atheromatous narrowing about the left bifurcation. Left ICA widely patent from the bifurcation to the skull base without stenosis, dissection, or occlusion. Vertebral arteries: Both of the  vertebral arteries arise from the subclavian arteries. Vertebral arteries widely patent within the neck without stenosis, dissection, or occlusion. Skeleton: No acute osseous finding. No discrete lytic or blastic osseous lesions. Mild to moderate cervical spondylolysis at C4-5 through C6-7. Prominent right-sided facet arthrosis noted at C3-4. 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 Soft tissues of the neck demonstrate no other acute finding. Upper chest: Layering fluid noted within the upper esophageal lumen. Visualized upper chest demonstrates no other acute finding. Partially visualized lungs are clear. Review of the MIP images confirms the above findings CTA HEAD FINDINGS Anterior circulation: Petrous ICAs widely patent bilaterally. Cavernous and supraclinoid segments widely patent without stenosis. 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 ICA termini well perfused distally. A1 segments patent bilaterally. Right A1 hypoplastic, likely accounting for the dominant left ICA as compared to the right. Normal anterior communicating artery. Anterior cerebral arteries widely patent to their distal aspects without stenosis. Right M1 widely patent without stenosis. Normal right MCA bifurcation. Distal right MCA branches well perfused. Left M1 patent proximally. 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 Upon review of prior MRI from 2010, this is likely chronic in nature. Stenosis measures approximately 3 mm in length. Left M1 patent distally. Normal left MCA bifurcation. Left MCA branches are opacified distally, although overall attenuated as compared to the right due to the severe M1 stenosis. Posterior circulation: Vertebral arteries patent to the vertebrobasilar junction without stenosis. Posterior inferior cerebral arteries patent bilaterally. Proximal and mid basilar  artery diminutive but widely patent without stenosis. Persistent left-sided trigeminal artery supplies the distal basilar artery which is more robust in caliber. Superior cerebral arteries patent bilaterally. Both of the posterior cerebral arteries primarily supplied via the basilar and are well perfused to  their distal aspects. Venous sinuses: Grossly patent, although limited in assessment due to arterial timing the contrast bolus. Anatomic variants: Persistent left trigeminal artery. No intracranial aneurysm. Delayed phase: Not performed. Review of the MIP images confirms the above findings CT Brain Perfusion Findings: CBF (<30%) Volume: 39mL Perfusion (Tmax>6.0s) volume: 132mL Mismatch Volume: 139mL Infarction Location:No core infarct identified by CT perfusion. Large perfusion mismatch throughout the left MCA territory due to the severe left M1 stenosis. IMPRESSION: 1. Negative CTA for large vessel occlusion. No core infarct evident by CT perfusion. 2. Short-segment severe mid left M1 stenosis, likely similar as compared to previous MRI from 11/19/2008. Associated delayed perfusion throughout the left MCA territory distally. 3. No other hemodynamically significant or correctable stenosis within the major arterial vasculature of the head and neck. 4. Persistent left trigeminal artery. Finding discussed with Dr. Leonel Ramsay by telephone at approximately 9:45 p.m. on 08/30/2018. Electronically Signed   By: Jeannine Boga M.D.   On: 08/30/2018 22:59   Dg Chest 2 View  Result Date: 08/30/2018 CLINICAL DATA:  62 year old male with slurred speech and cough today. EXAM: CHEST - 2 VIEW COMPARISON:  08/27/2018 chest radiographs and earlier. FINDINGS: Lower lung volumes on both views. No pneumothorax, pulmonary edema, pleural effusion or consolidation. Mild basilar crowding of markings. Stable cardiac size at the upper limits of normal. Other mediastinal contours are within normal limits. Visualized tracheal air  column is within normal limits. No acute osseous abnormality identified. Negative visible bowel gas pattern. Previous ventral abdominal hernia repair with mesh. IMPRESSION: Lower lung volumes, otherwise no acute cardiopulmonary abnormality. Electronically Signed   By: Genevie Ann M.D.   On: 08/30/2018 17:15   Ct Head Wo Contrast  Result Date: 08/30/2018 CLINICAL DATA:  63 year old male with abnormal speech onset today. EXAM: CT HEAD WITHOUT CONTRAST TECHNIQUE: Contiguous axial images were obtained from the base of the skull through the vertex without intravenous contrast. COMPARISON:  Brain MRI 11/19/2008. FINDINGS: Brain: A small area of right parietal lobe cortical encephalomalacia is redemonstrated on series 3, image 22. Cerebral volume has not significantly changed. New abnormal hypodensity in the left posterior limb internal capsule and/or lentiform (series 3, image 14). New hypodensity also in the left caudate and anterior lentiform on image 17. No acute intracranial hemorrhage identified. No midline shift, mass effect, or evidence of intracranial mass lesion. No ventriculomegaly. No acute cortically based infarct identified. Incidental tentorial dural calcifications. Vascular: No suspicious intracranial vascular hyperdensity. Skull: Negative. Sinuses/Orbits: Right frontal ethmoid and maxillary sinus disease with fluid levels is new since 2010. The right sphenoid and left paranasal sinuses are spared. Tympanic cavities and mastoids are clear. Other: Mildly Disconjugate gaze, otherwise negative orbits. Visualized scalp soft tissues are within normal limits. IMPRESSION: 1. 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 2. Right [facility] pattern paranasal sinusitis. Electronically Signed   By: Genevie Ann M.D.   On: 08/30/2018 17:39   Ct Angio Neck W Or Wo Contrast  Result Date: 08/30/2018 CLINICAL DATA:  Initial evaluation for acute speech difficulty, right facial weakness. EXAM: CT  ANGIOGRAPHY HEAD AND NECK CT PERFUSION BRAIN TECHNIQUE: Multidetector CT imaging of the head and neck was performed using the standard protocol during bolus administration of intravenous contrast. Multiplanar CT image reconstructions and MIPs were obtained to evaluate the vascular anatomy. Carotid stenosis measurements (when applicable) are obtained utilizing NASCET criteria, using the distal internal carotid diameter as the denominator. Multiphase CT imaging of the brain was performed following  IV bolus contrast injection. Subsequent parametric perfusion maps were calculated using RAPID software. CONTRAST:  123mL ISOVUE-370 IOPAMIDOL (ISOVUE-370) INJECTION 76% COMPARISON:  Prior noncontrast CT from earlier same day. FINDINGS: CTA NECK FINDINGS Aortic arch: Visualized aortic arch of normal caliber with normal branch pattern. No hemodynamically significant stenosis about the origin of the great vessels. Visualized subclavian arteries widely patent. Right carotid system: Right common carotid artery patent from its origin to the bifurcation without stenosis. Minimal mixed plaque about the right bifurcation without stenosis. Short-segment mild stenosis of approximately 20% by NASCET criteria just distally within the proximal right ICA. Right ICA otherwise widely patent to the skull base without stenosis, dissection, or occlusion. Left carotid system: Left common carotid artery patent from its origin to the bifurcation without stenosis. No significant atheromatous narrowing about the left bifurcation. Left ICA widely patent from the bifurcation to the skull base without stenosis, dissection, or occlusion. Vertebral arteries: Both of the vertebral arteries arise from the subclavian arteries. Vertebral arteries widely patent within the neck without stenosis, dissection, or occlusion. Skeleton: No acute osseous finding. No discrete lytic or blastic osseous lesions. Mild to moderate cervical spondylolysis at C4-5 through  C6-7. Prominent right-sided facet arthrosis noted at C3-4. 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 Soft tissues of the neck demonstrate no other acute finding. Upper chest: Layering fluid noted within the upper esophageal lumen. Visualized upper chest demonstrates no other acute finding. Partially visualized lungs are clear. Review of the MIP images confirms the above findings CTA HEAD FINDINGS Anterior circulation: Petrous ICAs widely patent bilaterally. Cavernous and supraclinoid segments widely patent without stenosis. 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 ICA termini well perfused distally. A1 segments patent bilaterally. Right A1 hypoplastic, likely accounting for the dominant left ICA as compared to the right. Normal anterior communicating artery. Anterior cerebral arteries widely patent to their distal aspects without stenosis. Right M1 widely patent without stenosis. Normal right MCA bifurcation. Distal right MCA branches well perfused. Left M1 patent proximally. 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 Upon review of prior MRI from 2010, this is likely chronic in nature. Stenosis measures approximately 3 mm in length. Left M1 patent distally. Normal left MCA bifurcation. Left MCA branches are opacified distally, although overall attenuated as compared to the right due to the severe M1 stenosis. Posterior circulation: Vertebral arteries patent to the vertebrobasilar junction without stenosis. Posterior inferior cerebral arteries patent bilaterally. Proximal and mid basilar artery diminutive but widely patent without stenosis. Persistent left-sided trigeminal artery supplies the distal basilar artery which is more robust in caliber. Superior cerebral arteries patent bilaterally. Both of the posterior cerebral arteries primarily supplied via the basilar  and are well perfused to their distal aspects. Venous sinuses: Grossly patent, although limited in assessment due to arterial timing the contrast bolus. Anatomic variants: Persistent left trigeminal artery. No intracranial aneurysm. Delayed phase: Not performed. Review of the MIP images confirms the above findings CT Brain Perfusion Findings: CBF (<30%) Volume: 57mL Perfusion (Tmax>6.0s) volume: 170mL Mismatch Volume: 149mL Infarction Location:No core infarct identified by CT perfusion. Large perfusion mismatch throughout the left MCA territory due to the severe left M1 stenosis. IMPRESSION: 1. Negative CTA for large vessel occlusion. No core infarct evident by CT perfusion. 2. Short-segment severe mid left M1 stenosis, likely similar as compared to previous MRI from 11/19/2008. Associated delayed perfusion throughout the left MCA territory  distally. 3. No other hemodynamically significant or correctable stenosis within the major arterial vasculature of the head and neck. 4. Persistent left trigeminal artery. Finding discussed with Dr. Leonel Ramsay by telephone at approximately 9:45 p.m. on 08/30/2018. Electronically Signed   By: Jeannine Boga M.D.   On: 08/30/2018 22:59   Mr Brain Wo Contrast  Result Date: 08/30/2018 CLINICAL DATA:  63 y/o  M; right-sided weakness. EXAM: MRI HEAD WITHOUT CONTRAST TECHNIQUE: Multiplanar, multiecho pulse sequences of the brain and surrounding structures were obtained without intravenous contrast. COMPARISON:  08/30/2018 CT head, CT perfusion head, and CTA head. FINDINGS: Brain: Multiple foci of reduced diffusion are present within the left caudate body, left mid and posterior corona radiata, left posterior limb of internal capsule extending into left putamen, as well as several additional small foci of the temporoparietal periventricular white matter and the left frontal operculum compatible with acute/early subacute infarction. No associated hemorrhage or mass effect.  Infarcts are T2 FLAIR hyperintense. No acute hemorrhage. No extra-axial collection, hydrocephalus, mass effect, or herniation. Small chronic infarcts in the right parietal cortex and right occipital periventricular white matter. Mild chronic microvascular ischemic changes of the brain. Mild brain parenchymal volume loss. Vascular: Please refer to CT angiogram of the head and neck. Skull and upper cervical spine: Normal marrow signal. Sinuses/Orbits: Right frontal, ethmoid, and maxillary sinus partial opacification and fluid level. No abnormal signal of the mastoid air cells. Orbits are unremarkable. Other: None. IMPRESSION: 1. Multiple small foci of acute/early subacute infarction are present in the left MCA distribution concentrated in left basal ganglia inclusive of the corona radiata and posterior limb of internal capsule. No hemorrhage or mass effect. 2. Mild chronic microvascular ischemic changes and volume loss of the brain. Small chronic infarcts in right parietal and occipital lobes. 3. Right frontal, ethmoid, and maxillary sinus disease is a right middle meatus obstructive pattern, direct visualization recommended. These results were called by telephone at the time of interpretation on 08/30/2018 at 10:46 pm to Dr. Leonel Ramsay, who verbally acknowledged these results. Electronically Signed   By: Kristine Garbe M.D.   On: 08/30/2018 22:48   Ct Cerebral Perfusion W Contrast  Result Date: 08/30/2018 CLINICAL DATA:  Initial evaluation for acute speech difficulty, right facial weakness. EXAM: CT ANGIOGRAPHY HEAD AND NECK CT PERFUSION BRAIN TECHNIQUE: Multidetector CT imaging of the head and neck was performed using the standard protocol during bolus administration of intravenous contrast. Multiplanar CT image reconstructions and MIPs were obtained to evaluate the vascular anatomy. Carotid stenosis measurements (when applicable) are obtained utilizing NASCET criteria, using the distal internal carotid  diameter as the denominator. Multiphase CT imaging of the brain was performed following IV bolus contrast injection. Subsequent parametric perfusion maps were calculated using RAPID software. CONTRAST:  134mL ISOVUE-370 IOPAMIDOL (ISOVUE-370) INJECTION 76% COMPARISON:  Prior noncontrast CT from earlier same day. FINDINGS: CTA NECK FINDINGS Aortic arch: Visualized aortic arch of normal caliber with normal branch pattern. No hemodynamically significant stenosis about the origin of the great vessels. Visualized subclavian arteries widely patent. Right carotid system: Right common carotid artery patent from its origin to the bifurcation without stenosis. Minimal mixed plaque about the right bifurcation without stenosis. Short-segment mild stenosis of approximately 20% by NASCET criteria just distally within the proximal right ICA. Right ICA otherwise widely patent to the skull base without stenosis, dissection, or occlusion. Left carotid system: Left common carotid artery patent from its origin to the bifurcation without stenosis. No significant atheromatous narrowing about the left bifurcation. Left  ICA widely patent from the bifurcation to the skull base without stenosis, dissection, or occlusion. Vertebral arteries: Both of the vertebral arteries arise from the subclavian arteries. Vertebral arteries widely patent within the neck without stenosis, dissection, or occlusion. Skeleton: No acute osseous finding. No discrete lytic or blastic osseous lesions. Mild to moderate cervical spondylolysis at C4-5 through C6-7. Prominent right-sided facet arthrosis noted at C3-4. 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 Soft tissues of the neck demonstrate no other acute finding. Upper chest: Layering fluid noted within the upper esophageal lumen. Visualized upper chest demonstrates no other acute finding. Partially visualized lungs are clear. Review of the MIP images confirms the above findings CTA HEAD FINDINGS  Anterior circulation: Petrous ICAs widely patent bilaterally. Cavernous and supraclinoid segments widely patent without stenosis. 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 ICA termini well perfused distally. A1 segments patent bilaterally. Right A1 hypoplastic, likely accounting for the dominant left ICA as compared to the right. Normal anterior communicating artery. Anterior cerebral arteries widely patent to their distal aspects without stenosis. Right M1 widely patent without stenosis. Normal right MCA bifurcation. Distal right MCA branches well perfused. Left M1 patent proximally. 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 Upon review of prior MRI from 2010, this is likely chronic in nature. Stenosis measures approximately 3 mm in length. Left M1 patent distally. Normal left MCA bifurcation. Left MCA branches are opacified distally, although overall attenuated as compared to the right due to the severe M1 stenosis. Posterior circulation: Vertebral arteries patent to the vertebrobasilar junction without stenosis. Posterior inferior cerebral arteries patent bilaterally. Proximal and mid basilar artery diminutive but widely patent without stenosis. Persistent left-sided trigeminal artery supplies the distal basilar artery which is more robust in caliber. Superior cerebral arteries patent bilaterally. Both of the posterior cerebral arteries primarily supplied via the basilar and are well perfused to their distal aspects. Venous sinuses: Grossly patent, although limited in assessment due to arterial timing the contrast bolus. Anatomic variants: Persistent left trigeminal artery. No intracranial aneurysm. Delayed phase: Not performed. Review of the MIP images confirms the above findings CT Brain Perfusion Findings: CBF (<30%) Volume: 18mL Perfusion (Tmax>6.0s) volume: 125mL Mismatch  Volume: 161mL Infarction Location:No core infarct identified by CT perfusion. Large perfusion mismatch throughout the left MCA territory due to the severe left M1 stenosis. IMPRESSION: 1. Negative CTA for large vessel occlusion. No core infarct evident by CT perfusion. 2. Short-segment severe mid left M1 stenosis, likely similar as compared to previous MRI from 11/19/2008. Associated delayed perfusion throughout the left MCA territory distally. 3. No other hemodynamically significant or correctable stenosis within the major arterial vasculature of the head and neck. 4. Persistent left trigeminal artery. Finding discussed with Dr. Leonel Ramsay by telephone at approximately 9:45 p.m. on 08/30/2018. Electronically Signed   By: Jeannine Boga M.D.   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 Does the need for close, 24 hr/day medical supervision in concert with the patient's rehab needs make it unreasonable for this patient to be served in a less intensive setting? Yes 2. Co-Morbidities requiring supervision/potential complications: Fall risk, cryptogenic stroke question underlying cardiac arrhythmia 3. Due to bladder management, bowel management, safety, skin/wound care, disease management, medication administration, pain management and patient education, does the patient require 24 hr/day rehab nursing?  Yes 4. Does the patient require coordinated care of a physician, rehab nurse, PT (1-2 hrs/day, 5 days/week), OT (1-2 hrs/day, 5 days/week) and SLP (.5-1 hrs/day, 5 days/week) to address physical and functional deficits in the context of the above medical diagnosis(es)? Yes Addressing deficits in the following areas: balance, endurance, locomotion, strength, transferring, bowel/bladder control, bathing, dressing, feeding, grooming, toileting, cognition, speech, language and psychosocial  support 5. Can the patient actively participate in an intensive therapy program of at least 3 hrs of therapy per day at least 5 days per week? Yes 6. The potential for patient to make measurable gains while on inpatient rehab is excellent 7. Anticipated functional outcomes upon discharge from inpatient rehab are supervision  with PT, supervision with OT, supervision and min assist with SLP. 8. Estimated rehab length of stay to reach the above functional goals is: 10-14d 9. Anticipated D/C setting: Home 10. Anticipated post D/C treatments: Outpatient therapy 11. Overall Rehab/Functional Prognosis: excellent  RECOMMENDATIONS: This patient's condition is appropriate for continued rehabilitative care in the following setting: CIR Patient has agreed to participate in recommended program. Yes Note that insurance prior authorization may be required for reimbursement for recommended care.  Comment:  "I have personally performed a face to face diagnostic evaluation of this patient.  Additionally, I have reviewed and concur with the physician assistant's documentation above." [name] M.D. [facility] FAAPM&R (Sports Med, Neuromuscular Med) Diplomate [place] Board of [place], PA-C 08/31/2018

## 2018-08-31 NOTE — Evaluation (Addendum)
Occupational Therapy Evaluation Patient Details Name: Edward Glass MRN: [id number] DOB: 1955/08/14 Today's Date: 08/31/2018    History of Present Illness Patient is a 63 y/o male presenting to the ED on 08/30/2018 with primary complaints of aphasia, R facila droop and R sided weakness. CT revealing Age indeterminate small vessel ischemia in the left basal ganglia. 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 PMH significant for hypogonadism, possible BPH.    Clinical Impression   Pt admitted with the above diagnoses and presents with below problem list. Pt will benefit from continued acute OT to address the below listed deficits and maximize independence with basic ADLs prior to d/c to venue below. PTA pt was independent with ADLs/IADLs, works as a Physiological scientist. Pt is currently mod-max A with UB/LB ADLs, mod A +2 physical assist and chair follow for mobility. Some R side neglect noted. Possible cognitive deficits (sequencing, somewhat impulsive) but difficult to assess fully due to impaired communication. Pt very motivated to work with therapy. Family present and very supportive of pt. Excellent rehab potential, especially given his baseline.      Follow Up Recommendations  CIR    Equipment Recommendations  Other (comment)(defer to next venue)    Recommendations for Other Services       Precautions / Restrictions Precautions Precautions: Fall Restrictions Weight Bearing Restrictions: No      Mobility Bed Mobility Overal bed mobility: Needs Assistance Bed Mobility: Supine to Sit     Supine to sit: Min assist     General bed mobility comments: increased time and effort by patient. exited bed on R side. Some RUE neglect noted.   Transfers Overall transfer level: Needs assistance Equipment used: 1 person  hand held assist Transfers: Sit to/from Stand Sit to Stand: Mod assist              Balance Overall balance assessment: Needs assistance Sitting-balance support: Bilateral upper extremity supported;Feet supported Sitting balance-Leahy Scale: Fair     Standing balance support: Single extremity supported;During functional activity Standing balance-Leahy Scale: Poor                             ADL either performed or assessed with clinical judgement   ADL Overall ADL's : Needs assistance/impaired Eating/Feeding: Minimal assistance;Modified independent;Sitting Eating/Feeding Details (indicate cue type and reason): provided built-up foam for use with feeding utensils Grooming: Sitting;Moderate assistance   Upper Body Bathing: Moderate assistance;Sitting   Lower Body Bathing: Maximal assistance;Sit to/from stand   Upper Body Dressing : Maximal assistance;Sitting   Lower Body Dressing: Maximal assistance   Toilet Transfer: Moderate assistance;+2 for physical assistance;Stand-pivot;Ambulation;BSC;RW;+2 for safety/equipment Toilet Transfer Details (indicate cue type and reason): walked halfway to bathroom this session Toileting- Clothing Manipulation and Hygiene: Moderate assistance;+2 for physical assistance   Tub/ Shower Transfer: Moderate assistance;+2 for physical assistance   Functional mobility during ADLs: Moderate assistance;+2 for physical assistance;+2 for safety/equipment General ADL Comments: Pt completed bed mobility. Sat EOB a few minutes at min guard level. Stood and walked halfway to bathroom this session. Needed chair follow. 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 Family reports h/o issues with R shoulder. difficulty maintaining full grasp on rw. RUE Sensation: decreased proprioception RUE Coordination: decreased fine motor;decreased gross motor   Lower Extremity Assessment Lower Extremity Assessment: Defer to PT evaluation   Cervical / Trunk Assessment Cervical / Trunk Assessment: Normal   Communication Communication Communication: Expressive difficulties;Other (comment)(possible receptive difficulties vs. impaired cognition?)   Cognition Arousal/Alertness: Awake/alert Behavior During Therapy: Flat affect Overall Cognitive Status: Difficult to assess                                 General Comments: difficulty sequencing   General Comments  multiple family present and very supportive    Exercises Exercises: Other exercises Other Exercises Other Exercises: Educated pt and family on AROM exercises including scapula. Provided therapy hand ball and instructed in use.   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 Problem List: Decreased strength;Decreased activity tolerance;Impaired balance (sitting and/or standing);Decreased range of motion;Decreased coordination;Decreased cognition;Decreased safety awareness;Decreased knowledge of use of DME or AE;Decreased knowledge of precautions;Impaired UE functional use;Impaired tone      OT Treatment/Interventions: Self-care/ADL training;Therapeutic exercise;Neuromuscular education;DME and/or AE instruction;Therapeutic activities;Patient/family education;Balance training;Cognitive remediation/compensation    OT Goals(Current goals can be found in the care plan section) Acute Rehab OT Goals Patient Stated Goal: "regain independence" OT Goal Formulation: With patient Time For Goal Achievement: 09/14/18 Potential to Achieve Goals: Good ADL Goals Pt Will Perform Eating: with modified independence;sitting Pt Will Perform Grooming: with modified independence;sitting Pt Will Perform Upper Body Bathing: sitting;with min assist Pt Will Perform Lower Body Bathing: with min assist;sit to/from stand Pt Will Perform Upper Body Dressing: with min assist;sitting Pt Will Perform Lower Body Dressing: with min assist;sit to/from stand Pt Will Transfer to Toilet: with min assist;ambulating Pt Will Perform Toileting - Clothing Manipulation and hygiene: with min assist;sit to/from stand Pt/caregiver will Perform Home Exercise Program: Increased strength;Right Upper extremity;With written HEP provided;Independently Additional ADL Goal #1: Pt will complete bed mobility at mod I level to prepare for OOB ADLs.  OT Frequency: Min 3X/week   Barriers to D/C:            Co-evaluation              AM-PAC OT "6 Clicks" Daily Activity     Outcome Measure Help from another person eating meals?: A Little Help from another person taking care of personal  grooming?:  A Lot Help from another person toileting, which includes using toliet, bedpan, or urinal?: A Lot Help from another person bathing (including washing, rinsing, drying)?: A Lot Help from another person to put on and taking off regular upper body clothing?: A Lot Help from another person to put on and taking off regular lower body clothing?: A Lot 6 Click Score: 13   End of Session Equipment Utilized During Treatment: Rolling walker  Activity Tolerance: Patient tolerated treatment well;Patient limited by fatigue(walked halfway to bathroom, needed chair brought to him) Patient left: in chair;with call bell/phone within reach;with family/visitor present;Other (comment)(with SLP)  OT Visit Diagnosis: Unsteadiness on feet (R26.81);Other abnormalities of gait and mobility (R26.89);Ataxia, unspecified (R27.0);Other symptoms and signs involving cognitive function;Cognitive communication deficit (R41.841);Hemiplegia and hemiparesis Hemiplegia - Right/Left: Right Hemiplegia - dominant/non-dominant: Dominant                Time: 1315-1340 OT Time Calculation (min): 25 min Charges:  OT General Charges $OT Visit: 1 Visit OT Evaluation $OT Eval Moderate Complexity: 1 Mod OT Treatments $Self Care/Home Management : 8-22 mins  Tyrone Schimke, OT Acute Rehabilitation Services Pager: [phone] Office: [phone]   Hortencia Pilar 08/31/2018, 2:08 PM

## 2018-08-31 NOTE — Progress Notes (Addendum)
STROKE TEAM PROGRESS NOTE   HISTORY OF PRESENT ILLNESS (per record)  Edward Glass is a 63 y.o. male who presents with right-sided weakness and aphasia. Since arrival, he is actually been gradually improving.  But still has significant difficulty with his right side as well as aphasia. On CTA there was a possible L M1 occulsion vs severe stenosis. He arrived greater than 24h from time last known well and evolving stroke was evident on CT. Therefore no acute stroke interventions were done.   SUBJECTIVE (INTERVAL HISTORY) His wife and colleague is at the bedside.  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 He is very fit and eats heart healthy. This stroke is devastating to him as his physical ability is his identity and livelihood.     OBJECTIVE Vitals:   08/31/18 0100 08/31/18 0300 08/31/18 0342 08/31/18 0722  BP: 129/67 106/65 (!) 108/58 112/65  Pulse: (!) 53 (!) 58 (!) 55 60  Resp: 18 18 18 18   Temp: 98 F (36.7 C) 98.2 F (36.8 C) 98.2 F (36.8 C) 98.7 F (37.1 C)  TempSrc: Oral Oral Oral Oral  SpO2: 97% 96% 97% 97%  Weight:      Height:        CBC:  Recent Labs  Lab 08/27/18 1132 08/30/18 1546  WBC 5.4 6.5  NEUTROABS 3.4 3.8  HGB 15.2 15.4  HCT 46.8 48.5  MCV 90.7 91.9  PLT 260 517    Basic Metabolic Panel:  Recent Labs  Lab 08/27/18 1412 08/30/18 1546  NA 137 140  K 4.0 3.7  CL 107 105  CO2 23 23  GLUCOSE 102* 100*  BUN 23 17  CREATININE 0.78 0.91  CALCIUM 9.0 9.4    Lipid Panel:     Component Value Date/Time   CHOL 173 08/31/2018 0447   CHOL 159 08/15/2013 0818   TRIG 86 08/31/2018 0447   TRIG 52 08/15/2013 0818   HDL 52 08/31/2018 0447   HDL 63 08/15/2013 0818   CHOLHDL 3.3 08/31/2018 0447   VLDL 17 08/31/2018 0447   LDLCALC 104 (H) 08/31/2018 0447   LDLCALC 86 08/15/2013 0818   HgbA1c:  Lab Results  Component Value Date   HGBA1C 6.2 (H) 08/31/2018   Urine Drug Screen:     Component  Value Date/Time   LABOPIA NONE DETECTED 08/30/2018 1817   COCAINSCRNUR NONE DETECTED 08/30/2018 1817   LABBENZ NONE DETECTED 08/30/2018 1817   AMPHETMU NONE DETECTED 08/30/2018 1817   THCU NONE DETECTED 08/30/2018 1817   LABBARB NONE DETECTED 08/30/2018 1817    Alcohol Level     Component Value Date/Time   ETH <10 08/30/2018 1657    IMAGING   Ct Angio Head W Or Wo Contrast  Result Date: 08/30/2018 CLINICAL DATA:  Initial evaluation for acute speech difficulty, right facial weakness. EXAM: CT ANGIOGRAPHY HEAD AND NECK CT PERFUSION BRAIN TECHNIQUE: Multidetector CT imaging of the head and neck was performed using the standard protocol during bolus administration of intravenous contrast. Multiplanar CT image reconstructions and MIPs were obtained to evaluate the vascular anatomy. Carotid stenosis measurements (when applicable) are obtained utilizing NASCET criteria, using the distal internal carotid diameter as the denominator. Multiphase CT imaging of the brain was performed following IV bolus contrast injection. Subsequent parametric perfusion maps were calculated using RAPID software. CONTRAST:  11mL ISOVUE-370 IOPAMIDOL (ISOVUE-370) INJECTION 76% COMPARISON:  Prior noncontrast CT from earlier same day. FINDINGS: CTA NECK FINDINGS  Aortic arch: Visualized aortic arch of normal caliber with normal branch pattern. No hemodynamically significant stenosis about the origin of the great vessels. Visualized subclavian arteries widely patent. Right carotid system: Right common carotid artery patent from its origin to the bifurcation without stenosis. Minimal mixed plaque about the right bifurcation without stenosis. Short-segment mild stenosis of approximately 20% by NASCET criteria just distally within the proximal right ICA. Right ICA otherwise widely patent to the skull base without stenosis, dissection, or occlusion. Left carotid system: Left common carotid artery patent from its origin to the  bifurcation without stenosis. No significant atheromatous narrowing about the left bifurcation. Left ICA widely patent from the bifurcation to the skull base without stenosis, dissection, or occlusion. Vertebral arteries: Both of the vertebral arteries arise from the subclavian arteries. Vertebral arteries widely patent within the neck without stenosis, dissection, or occlusion. Skeleton: No acute osseous finding. No discrete lytic or blastic osseous lesions. Mild to moderate cervical spondylolysis at C4-5 through C6-7. Prominent right-sided facet arthrosis noted at C3-4. 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 Soft tissues of the neck demonstrate no other acute finding. Upper chest: Layering fluid noted within the upper esophageal lumen. Visualized upper chest demonstrates no other acute finding. Partially visualized lungs are clear. Review of the MIP images confirms the above findings CTA HEAD FINDINGS Anterior circulation: Petrous ICAs widely patent bilaterally. Cavernous and supraclinoid segments widely patent without stenosis. 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 ICA termini well perfused distally. A1 segments patent bilaterally. Right A1 hypoplastic, likely accounting for the dominant left ICA as compared to the right. Normal anterior communicating artery. Anterior cerebral arteries widely patent to their distal aspects without stenosis. Right M1 widely patent without stenosis. Normal right MCA bifurcation. Distal right MCA branches well perfused. Left M1 patent proximally. 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 Upon review of prior MRI from 2010, this is likely chronic in nature. Stenosis measures approximately 3 mm in length. Left M1 patent distally. Normal left MCA bifurcation. Left MCA branches are opacified distally, although overall attenuated as  compared to the right due to the severe M1 stenosis. Posterior circulation: Vertebral arteries patent to the vertebrobasilar junction without stenosis. Posterior inferior cerebral arteries patent bilaterally. Proximal and mid basilar artery diminutive but widely patent without stenosis. Persistent left-sided trigeminal artery supplies the distal basilar artery which is more robust in caliber. Superior cerebral arteries patent bilaterally. Both of the posterior cerebral arteries primarily supplied via the basilar and are well perfused to their distal aspects. Venous sinuses: Grossly patent, although limited in assessment due to arterial timing the contrast bolus. Anatomic variants: Persistent left trigeminal artery. No intracranial aneurysm. Delayed phase: Not performed. Review of the MIP images confirms the above findings CT Brain Perfusion Findings: CBF (<30%) Volume: 77mL Perfusion (Tmax>6.0s) volume: 162mL Mismatch Volume: 170mL Infarction Location:No core infarct identified by CT perfusion. Large perfusion mismatch throughout the left MCA territory due to the severe left M1 stenosis. IMPRESSION: 1. Negative CTA for large vessel occlusion. No core infarct evident by CT perfusion. 2. Short-segment severe mid left M1 stenosis, likely similar as compared to previous MRI from 11/19/2008. Associated delayed perfusion throughout the left MCA territory distally. 3. No other hemodynamically significant or correctable stenosis within the major arterial vasculature of the head and neck. 4. Persistent left trigeminal artery. Finding discussed with Dr. Leonel Ramsay by telephone at approximately 9:45  p.m. on 08/30/2018. Electronically Signed   By: Jeannine Boga M.D.   On: 08/30/2018 22:59   Dg Chest 2 View  Result Date: 08/30/2018 CLINICAL DATA:  63 year old male with slurred speech and cough today. EXAM: CHEST - 2 VIEW COMPARISON:  08/27/2018 chest radiographs and earlier. FINDINGS: Lower lung volumes on both views.  No pneumothorax, pulmonary edema, pleural effusion or consolidation. Mild basilar crowding of markings. Stable cardiac size at the upper limits of normal. Other mediastinal contours are within normal limits. Visualized tracheal air column is within normal limits. No acute osseous abnormality identified. Negative visible bowel gas pattern. Previous ventral abdominal hernia repair with mesh. IMPRESSION: Lower lung volumes, otherwise no acute cardiopulmonary abnormality. Electronically Signed   By: Genevie Ann M.D.   On: 08/30/2018 17:15   Ct Head Wo Contrast  Result Date: 08/30/2018 CLINICAL DATA:  63 year old male with abnormal speech onset today. EXAM: CT HEAD WITHOUT CONTRAST TECHNIQUE: Contiguous axial images were obtained from the base of the skull through the vertex without intravenous contrast. COMPARISON:  Brain MRI 11/19/2008. FINDINGS: Brain: A small area of right parietal lobe cortical encephalomalacia is redemonstrated on series 3, image 22. Cerebral volume has not significantly changed. New abnormal hypodensity in the left posterior limb internal capsule and/or lentiform (series 3, image 14). New hypodensity also in the left caudate and anterior lentiform on image 17. No acute intracranial hemorrhage identified. No midline shift, mass effect, or evidence of intracranial mass lesion. No ventriculomegaly. No acute cortically based infarct identified. Incidental tentorial dural calcifications. Vascular: No suspicious intracranial vascular hyperdensity. Skull: Negative. Sinuses/Orbits: Right frontal ethmoid and maxillary sinus disease with fluid levels is new since 2010. The right sphenoid and left paranasal sinuses are spared. Tympanic cavities and mastoids are clear. Other: Mildly Disconjugate gaze, otherwise negative orbits. Visualized scalp soft tissues are within normal limits. IMPRESSION: 1. 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 2. Right OMC pattern paranasal  sinusitis. Electronically Signed   By: Genevie Ann M.D.   On: 08/30/2018 17:39   Ct Angio Neck W Or Wo Contrast  Result Date: 08/30/2018 CLINICAL DATA:  Initial evaluation for acute speech difficulty, right facial weakness. EXAM: CT ANGIOGRAPHY HEAD AND NECK CT PERFUSION BRAIN TECHNIQUE: Multidetector CT imaging of the head and neck was performed using the standard protocol during bolus administration of intravenous contrast. Multiplanar CT image reconstructions and MIPs were obtained to evaluate the vascular anatomy. Carotid stenosis measurements (when applicable) are obtained utilizing NASCET criteria, using the distal internal carotid diameter as the denominator. Multiphase CT imaging of the brain was performed following IV bolus contrast injection. Subsequent parametric perfusion maps were calculated using RAPID software. CONTRAST:  119mL ISOVUE-370 IOPAMIDOL (ISOVUE-370) INJECTION 76% COMPARISON:  Prior noncontrast CT from earlier same day. FINDINGS: CTA NECK FINDINGS Aortic arch: Visualized aortic arch of normal caliber with normal branch pattern. No hemodynamically significant stenosis about the origin of the great vessels. Visualized subclavian arteries widely patent. Right carotid system: Right common carotid artery patent from its origin to the bifurcation without stenosis. Minimal mixed plaque about the right bifurcation without stenosis. Short-segment mild stenosis of approximately 20% by NASCET criteria just distally within the proximal right ICA. Right ICA otherwise widely patent to the skull base without stenosis, dissection, or occlusion. Left carotid system: Left common carotid artery patent from its origin to the bifurcation without stenosis. No significant atheromatous narrowing about the left bifurcation. Left ICA widely patent from the bifurcation to the skull  base without stenosis, dissection, or occlusion. Vertebral arteries: Both of the vertebral arteries arise from the subclavian arteries.  Vertebral arteries widely patent within the neck without stenosis, dissection, or occlusion. Skeleton: No acute osseous finding. No discrete lytic or blastic osseous lesions. Mild to moderate cervical spondylolysis at C4-5 through C6-7. Prominent right-sided facet arthrosis noted at C3-4. 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 Soft tissues of the neck demonstrate no other acute finding. Upper chest: Layering fluid noted within the upper esophageal lumen. Visualized upper chest demonstrates no other acute finding. Partially visualized lungs are clear. Review of the MIP images confirms the above findings CTA HEAD FINDINGS Anterior circulation: Petrous ICAs widely patent bilaterally. Cavernous and supraclinoid segments widely patent without stenosis. 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 ICA termini well perfused distally. A1 segments patent bilaterally. Right A1 hypoplastic, likely accounting for the dominant left ICA as compared to the right. Normal anterior communicating artery. Anterior cerebral arteries widely patent to their distal aspects without stenosis. Right M1 widely patent without stenosis. Normal right MCA bifurcation. Distal right MCA branches well perfused. Left M1 patent proximally. 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 Upon review of prior MRI from 2010, this is likely chronic in nature. Stenosis measures approximately 3 mm in length. Left M1 patent distally. Normal left MCA bifurcation. Left MCA branches are opacified distally, although overall attenuated as compared to the right due to the severe M1 stenosis. Posterior circulation: Vertebral arteries patent to the vertebrobasilar junction without stenosis. Posterior inferior cerebral arteries patent bilaterally. Proximal and mid basilar artery diminutive but widely patent without stenosis.  Persistent left-sided trigeminal artery supplies the distal basilar artery which is more robust in caliber. Superior cerebral arteries patent bilaterally. Both of the posterior cerebral arteries primarily supplied via the basilar and are well perfused to their distal aspects. Venous sinuses: Grossly patent, although limited in assessment due to arterial timing the contrast bolus. Anatomic variants: Persistent left trigeminal artery. No intracranial aneurysm. Delayed phase: Not performed. Review of the MIP images confirms the above findings CT Brain Perfusion Findings: CBF (<30%) Volume: 17mL Perfusion (Tmax>6.0s) volume: 150mL Mismatch Volume: 160mL Infarction Location:No core infarct identified by CT perfusion. Large perfusion mismatch throughout the left MCA territory due to the severe left M1 stenosis. IMPRESSION: 1. Negative CTA for large vessel occlusion. No core infarct evident by CT perfusion. 2. Short-segment severe mid left M1 stenosis, likely similar as compared to previous MRI from 11/19/2008. Associated delayed perfusion throughout the left MCA territory distally. 3. No other hemodynamically significant or correctable stenosis within the major arterial vasculature of the head and neck. 4. Persistent left trigeminal artery. Finding discussed with Dr. Leonel Ramsay by telephone at approximately 9:45 p.m. on 08/30/2018. Electronically Signed   By: Jeannine Boga M.D.   On: 08/30/2018 22:59   Mr Brain Wo Contrast  Result Date: 08/30/2018 CLINICAL DATA:  63 y/o  M; right-sided weakness. EXAM: MRI HEAD WITHOUT CONTRAST TECHNIQUE: Multiplanar, multiecho pulse sequences of the brain and surrounding structures were obtained without intravenous contrast. COMPARISON:  08/30/2018 CT head, CT perfusion head, and CTA head. FINDINGS: Brain: Multiple foci of reduced diffusion are present within the left caudate body, left mid and posterior corona radiata, left posterior limb of internal capsule extending into left  putamen, as well as several additional small foci of the temporoparietal periventricular white matter and the left frontal operculum  compatible with acute/early subacute infarction. No associated hemorrhage or mass effect. Infarcts are T2 FLAIR hyperintense. No acute hemorrhage. No extra-axial collection, hydrocephalus, mass effect, or herniation. Small chronic infarcts in the right parietal cortex and right occipital periventricular white matter. Mild chronic microvascular ischemic changes of the brain. Mild brain parenchymal volume loss. Vascular: Please refer to CT angiogram of the head and neck. Skull and upper cervical spine: Normal marrow signal. Sinuses/Orbits: Right frontal, ethmoid, and maxillary sinus partial opacification and fluid level. No abnormal signal of the mastoid air cells. Orbits are unremarkable. Other: None. IMPRESSION: 1. Multiple small foci of acute/early subacute infarction are present in the left MCA distribution concentrated in left basal ganglia inclusive of the corona radiata and posterior limb of internal capsule. No hemorrhage or mass effect. 2. Mild chronic microvascular ischemic changes and volume loss of the brain. Small chronic infarcts in right parietal and occipital lobes. 3. Right frontal, ethmoid, and maxillary sinus disease is a right middle meatus obstructive pattern, direct visualization recommended. These results were called by telephone at the time of interpretation on 08/30/2018 at 10:46 pm to Dr. Leonel Ramsay, who verbally acknowledged these results. Electronically Signed   By: Kristine Garbe M.D.   On: 08/30/2018 22:48   Ct Cerebral Perfusion W Contrast  Result Date: 08/30/2018 CLINICAL DATA:  Initial evaluation for acute speech difficulty, right facial weakness. EXAM: CT ANGIOGRAPHY HEAD AND NECK CT PERFUSION BRAIN TECHNIQUE: Multidetector CT imaging of the head and neck was performed using the standard protocol during bolus administration of intravenous  contrast. Multiplanar CT image reconstructions and MIPs were obtained to evaluate the vascular anatomy. Carotid stenosis measurements (when applicable) are obtained utilizing NASCET criteria, using the distal internal carotid diameter as the denominator. Multiphase CT imaging of the brain was performed following IV bolus contrast injection. Subsequent parametric perfusion maps were calculated using RAPID software. CONTRAST:  17mL ISOVUE-370 IOPAMIDOL (ISOVUE-370) INJECTION 76% COMPARISON:  Prior noncontrast CT from earlier same day. FINDINGS: CTA NECK FINDINGS Aortic arch: Visualized aortic arch of normal caliber with normal branch pattern. No hemodynamically significant stenosis about the origin of the great vessels. Visualized subclavian arteries widely patent. Right carotid system: Right common carotid artery patent from its origin to the bifurcation without stenosis. Minimal mixed plaque about the right bifurcation without stenosis. Short-segment mild stenosis of approximately 20% by NASCET criteria just distally within the proximal right ICA. Right ICA otherwise widely patent to the skull base without stenosis, dissection, or occlusion. Left carotid system: Left common carotid artery patent from its origin to the bifurcation without stenosis. No significant atheromatous narrowing about the left bifurcation. Left ICA widely patent from the bifurcation to the skull base without stenosis, dissection, or occlusion. Vertebral arteries: Both of the vertebral arteries arise from the subclavian arteries. Vertebral arteries widely patent within the neck without stenosis, dissection, or occlusion. Skeleton: No acute osseous finding. No discrete lytic or blastic osseous lesions. Mild to moderate cervical spondylolysis at C4-5 through C6-7. Prominent right-sided facet arthrosis noted at C3-4. 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 Soft tissues of the neck demonstrate no other acute finding. Upper chest:  Layering fluid noted within the upper esophageal lumen. Visualized upper chest demonstrates no other acute finding. Partially visualized lungs are clear. Review of the MIP images confirms the above findings CTA HEAD FINDINGS Anterior circulation: Petrous ICAs widely patent bilaterally. Cavernous and supraclinoid segments widely patent without stenosis. 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 ICA termini well perfused distally.  A1 segments patent bilaterally. Right A1 hypoplastic, likely accounting for the dominant left ICA as compared to the right. Normal anterior communicating artery. Anterior cerebral arteries widely patent to their distal aspects without stenosis. Right M1 widely patent without stenosis. Normal right MCA bifurcation. Distal right MCA branches well perfused. Left M1 patent proximally. 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 Upon review of prior MRI from 2010, this is likely chronic in nature. Stenosis measures approximately 3 mm in length. Left M1 patent distally. Normal left MCA bifurcation. Left MCA branches are opacified distally, although overall attenuated as compared to the right due to the severe M1 stenosis. Posterior circulation: Vertebral arteries patent to the vertebrobasilar junction without stenosis. Posterior inferior cerebral arteries patent bilaterally. Proximal and mid basilar artery diminutive but widely patent without stenosis. Persistent left-sided trigeminal artery supplies the distal basilar artery which is more robust in caliber. Superior cerebral arteries patent bilaterally. Both of the posterior cerebral arteries primarily supplied via the basilar and are well perfused to their distal aspects. Venous sinuses: Grossly patent, although limited in assessment due to arterial timing the contrast bolus. Anatomic variants: Persistent left  trigeminal artery. No intracranial aneurysm. Delayed phase: Not performed. Review of the MIP images confirms the above findings CT Brain Perfusion Findings: CBF (<30%) Volume: 6mL Perfusion (Tmax>6.0s) volume: 167mL Mismatch Volume: 167mL Infarction Location:No core infarct identified by CT perfusion. Large perfusion mismatch throughout the left MCA territory due to the severe left M1 stenosis. IMPRESSION: 1. Negative CTA for large vessel occlusion. No core infarct evident by CT perfusion. 2. Short-segment severe mid left M1 stenosis, likely similar as compared to previous MRI from 11/19/2008. Associated delayed perfusion throughout the left MCA territory distally. 3. No other hemodynamically significant or correctable stenosis within the major arterial vasculature of the head and neck. 4. Persistent left trigeminal artery. Finding discussed with Dr. Leonel Ramsay by telephone at approximately 9:45 p.m. on 08/30/2018. Electronically Signed   By: Jeannine Boga M.D.   On: 08/30/2018 22:59     Transthoracic Echocardiogram  00/00/00 Pending     PHYSICAL EXAM Blood pressure 112/65, pulse 60, temperature 98.7 F (37.1 C), temperature source Oral, resp. rate 18, height 5\' 5"  (1.651 m), weight 79.3 kg, SpO2 97 %. Constitutional: Appears well-developed and well-nourished. Appears younger than stated age. Psych: Affect appropriate to situation; depressed Eyes: No scleral injection HENT: No OP obstrucion Head: Normocephalic.  Cardiovascular: Normal rate and regular rhythm.  Respiratory: Effort normal, non-labored breathing GI: Soft.  No distension. There is no tenderness.  Skin: WDI  Neuro: Mental Status: Patient is awake, alert, oriented to person, place, month, year, and situation. He has severe expressive and mild receptive aphasia with mild dysarthria, difficult to understand. When asked to name people in room, he is able to name his wife with great pause, and says "I know who she is", but  can't say name of his long time colleague. He has increased latency of speech, but is able to name some but not all simple objects, but with difficulty and is able to repeat.   Mild right hemi-neglect Cranial Nerves: II: There is a right visual field cut and slight neglect. Pupils are equal, round, and reactive to light.   III,IV, VI: EOMI without ptosis or diploplia.  V: Facial sensation is symmetric to temperature VII: Facial movement with right facial droop VIII: hearing is intact to voice X: Uvula elevates symmetrically  XI: Shoulder shrug is symmetric. XII: tongue is midline without atrophy or fasciculations.  Motor: He has 4-/5 weakness of his right arm and leg, with fine motor movements disproportionately affected given his degree of weakness.  Sensory: There is neglect to bilateral sensation in right arm/right leg, but not face. Cerebellar: Finger-nose-finger intact on the left, consistent with weakness on the right  Baseline MRS 0 NIHSS  7  HOME MEDICATIONS:  Medications Prior to Admission  Medication Sig Dispense Refill  . alfuzosin (UROXATRAL) 10 MG 24 hr tablet Take 10 mg by mouth 3 (three) times a week.    Marland Kitchen aspirin 81 MG tablet Take 81 mg by mouth daily.    Marland Kitchen MILK THISTLE PO Take 2 tablets by mouth daily.    . Misc Natural Products (SAW PALMETTO) CAPS Take 2-4 capsules by mouth daily.    . multivitamin (ONE-A-DAY MEN'S) TABS tablet Take 1 tablet by mouth daily.    . sildenafil (REVATIO) 20 MG tablet 2-5 pills as needed for ED symptoms (Patient not taking: Reported on 08/30/2018) 50 tablet 5      HOSPITAL MEDICATIONS:  . [START ON 09/01/2018] alfuzosin  10 mg Oral Once per day on Mon Wed Fri  . [START ON 09/01/2018] aspirin EC  81 mg Oral Daily  . atorvastatin  40 mg Oral q1800  . clopidogrel  75 mg Oral Daily  . enoxaparin (LOVENOX) injection  40 mg Subcutaneous Q24H  . iopamidol      . multivitamin with minerals  1 tablet Oral Daily    ALLERGIES No Known  Allergies  PAST MEDICAL HISTORY Past Medical History:  Diagnosis Date  . Atypical chest pain 08/10/2013  . Elevated aspartate aminotransferase level 06/16/2015  . Excessive salivation 06/16/2015  . H/O nutritional disorder 06/16/2015  . 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 Screening for prostate cancer 08/21/2014  . 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 HERNIA REPAIR  2006  . left tricep surgery      FAMILY HISTORY Family History  Problem Relation Age of Onset  . Kidney failure Mother   . Cancer Mother   . Prostate cancer Father   . Cancer Father   . Diabetes Father   . Colon cancer Neg Hx   . Esophageal cancer Neg Hx   . Rectal cancer Neg Hx   . Stomach cancer Neg Hx     SOCIAL HISTORY  reports that he has never smoked. He has never used smokeless tobacco. He reports that he does not drink alcohol or use drugs.  ASSESSMENT/PLAN 63 year old male who is very fit/healthy at baseline. MRI shows new scattered left acute ischemic strokes. CTA shows L M1 near occulsion. He arrived greater than 24h from time since last well, therefore no acute stroke interventions done. 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 Stroke wk up underway. Pt and SO deny any palpations. When pts dtrs arrive, they both explain that in 2016 after tricep repair surgery they noted an possible arrhythmia and placed him on holter monitor for 30d, but was unrevealing.    Stroke: left MCA patchy secondary to left M1 occlusion due to embolism with partial reconstitution. Etiology cryptogenic  Resultant right side weakness, dysarthria and mixed aphasia  CT head patchy areas in the left hemisphere MRI head -  MRA head - scattered L MCA infarcts, mostly concentrated in L BG  CTA H&N- severe stenosis versus occlusion of the left M1; will discuss  further with radiology for possible stenting option  2D Echo - pending results, if neg will need TEE and Loop recorder  LDL - 104  HgbA1c - 6.2  UDS - neg  VTE prophylaxis - lovenox  Diet heart healthy, cleared by SLP   prior to admission was taking 81mg  ASA, now on DAPT after Plavix load of 300mg   Patient counseled to be compliant with his antithrombotic medications  Ongoing aggressive stroke risk factor management  Therapy recommendations:  pending  Disposition:  Pending  Hypertension  Stable . Permissive hypertension (OK if < 220/120) but gradually normalize in 5-7 days . 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 will start Prozac today as this has been shown to also improve motor deficits in stroke pts.    Hospital day # 0  Desiree Metzger-Cihelka, ARNP-C, ANVP-BC I have personally obtained history,examined this patient, reviewed notes, independently viewed imaging studies, participated in medical decision making and plan of care.ROS completed by me personally and pertinent positives fully documented  I have made any additions or clarifications directly to the above note. Agree with note above.  He presented with sudden onset of aphasia and right sided weakness secondary to embolic left MCA infarct etiology to be determined. Continue ongoing stroke workup and dual antiplatelet therapy and aggressive risk factor modification. Check TEE and transcranial Doppler bubble study and loop recorder. Patient also appears to be at risk for sleep apnea given history of snoring and witnessed sleep apnea by his wife. He is interested in participating in the sleep  smart stroke study and was given information to review and decide. Greater than 50% time during this 35 minute visit was spent on counseling and coordination of care about his embolic stroke discussion about evaluation, treatment and answering questions. Discussed with patient, his 2 daughters and Dr. Aline Brochure, MD Medical Director [place] Pager: [phone] 08/31/2018 4:46 PM  To contact Stroke Continuity provider, please refer to [email or web address] After hours, contact General Neurology

## 2018-08-31 NOTE — Progress Notes (Signed)
Rehab Admissions Coordinator Note:  Patient was screened by Retta Diones for appropriateness for an Inpatient Acute Rehab Consult.  At this time, we are recommending Inpatient Rehab consult.  Jodell Cipro M 08/31/2018, 11:44 AM  I can be reached at [phone].

## 2018-09-01 ENCOUNTER — Inpatient Hospital Stay (HOSPITAL_COMMUNITY): Payer: BLUE CROSS/BLUE SHIELD

## 2018-09-01 DIAGNOSIS — I639 Cerebral infarction, unspecified: Secondary | ICD-10-CM

## 2018-09-01 DIAGNOSIS — E291 Testicular hypofunction: Secondary | ICD-10-CM

## 2018-09-01 DIAGNOSIS — I63512 Cerebral infarction due to unspecified occlusion or stenosis of left middle cerebral artery: Secondary | ICD-10-CM

## 2018-09-01 DIAGNOSIS — G9340 Encephalopathy, unspecified: Secondary | ICD-10-CM

## 2018-09-01 MED ORDER — STARCH (THICKENING) PO POWD
ORAL | Status: DC | PRN
  Filled 2018-09-01 (×3): qty 227

## 2018-09-01 MED ORDER — SODIUM CHLORIDE 0.9 % IV SOLN
500.0000 mL | INTRAVENOUS | Status: DC
  Administered 2018-09-01: 500 mL via INTRAVENOUS

## 2018-09-01 NOTE — Progress Notes (Signed)
Patient's family reported a decreased sensation and movement in right arm and leg. NIH significantly changed, NIH is now an 8. Edward Morale, PA notified. PA came to bedside and ordered STAT CT. Nurse will continue to monitor. [place]

## 2018-09-01 NOTE — Progress Notes (Signed)
PT Cancellation Note  Patient Details Name: Edward Glass MRN: [id number] DOB: [birth year]   Cancelled Treatment:    Reason Eval/Treat Not Completed: Medical issues which prohibited therapy Pt with neuro changes overnight.  Spoke with MD who requests therapy hold today.  Will check back as appropriate.   [name] A [name] 09/01/2018, 12:26 PM [name], PT, DPT Acute Rehabilitation Services Pager [phone] Office [phone]

## 2018-09-01 NOTE — Progress Notes (Signed)
TCD bubble study and bilateral lower extremity venous duplex completed. Refer to "CV Proc" under chart review to view preliminary results.  09/01/2018 2:19 PM Maudry Mayhew, MHA, RVT, RDCS, RDMS

## 2018-09-01 NOTE — Progress Notes (Addendum)
Was paged by RN. Patient NIHSS increased by 4 points.  Patient unable to move right arm, and yesterday he was able to.   Patient was unable to grip hand on right side. Was unable to move RUE or [name]. Both extremities flaccid. Ordered a stat head CT.   Stroke team made aware. Per notes and discussion with stroke team. Patient symptoms wax and wane. He does have stenosis of M1 and may be pressure dependent.  Scheduled for TEE today.  NIHSS on my exam 7. 1a Level of Conscious:0 1b LOC Questions: 0 1c LOC Commands: 0 2 Best Gaze: 0 3 Visual:0  4 Facial Palsy: 0 5a Motor Arm - left: 0 5b Motor Arm - Right: 3 6a Motor Leg - Left:  6b Motor Leg - Right: 3 7 Limb Ataxia: 0 8 Sensory:0  9 Best Language: 0 10 Dysarthria:1 11 Extinct. and Inattention:0 TOTAL: [place], MSN, NP-C [facility] [phone]

## 2018-09-01 NOTE — Plan of Care (Signed)
Nutrition Education Note  RD consulted for nutrition education regarding a Heart Healthy/Carbohydrate modified diet.   Lipid Panel     Component Value Date/Time   CHOL 173 08/31/2018 0447   CHOL 159 08/15/2013 0818   TRIG 86 08/31/2018 0447   TRIG 52 08/15/2013 0818   HDL 52 08/31/2018 0447   HDL 63 08/15/2013 0818   CHOLHDL 3.3 08/31/2018 0447   VLDL 17 08/31/2018 0447   LDLCALC 104 (H) 08/31/2018 0447   LDLCALC 86 08/15/2013 0818    RD provided "Heart Healthy, Consistent Carbohydrate Nutrition Therapy" handout from the Academy of [employer] Dietetics. Pt with asphasia. Family at bedside reports diet had just advanced and was able to consume applesauce and graham crackers with good appetite. Pt ate well PTA with usual consumption of at least 3 meals a day with snacks. Reviewed patient's dietary recall. 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 Noted, HbA1C is 6.2% which meets criteria for pre-diabetes. Discouraged intake of processed foods. Encouraged fresh fruits and vegetables as well as whole grain sources of carbohydrates to maximize fiber intake. Discussed diabetic friendly drink options. Teach back method used.  Expect good compliance.  Body mass index is 29.09 kg/m. Pt meets criteria for overweight based on current BMI.  Current diet order is dysphagia 2 with nectar thick liquids. Pt with good appetite. Labs and medications reviewed. No further nutrition interventions warranted at this time. RD contact information provided. If additional nutrition issues arise, please re-consult RD.  Corrin Parker, MS, RD, LDN Pager # [phone] After hours/ weekend pager # [phone]

## 2018-09-01 NOTE — Evaluation (Signed)
Clinical/Bedside Swallow Evaluation Patient Details  Name: Edward Glass MRN: [id number] Date of Birth: [birth year]  Today's Date: 09/01/2018 Time: SLP Start Time (ACUTE ONLY): 9449 SLP Stop Time (ACUTE ONLY): 1600 SLP Time Calculation (min) (ACUTE ONLY): 45 min  Past Medical History:  Past Medical History:  Diagnosis Date  . Atypical chest pain 08/10/2013  . Elevated aspartate aminotransferase level 06/16/2015  . Excessive salivation 06/16/2015  . H/O nutritional disorder 06/16/2015  . 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 Screening for prostate cancer 08/21/2014  . 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 HERNIA REPAIR  2006  . left tricep surgery     HPI:  Edward Glass is a 63 y.o. male with medical history significant of hypogonadism, possible BPH, who presents with confusion, difficulty speaking, right facial droop. 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 Edward Glass demonstrated increased weakness on 08/31/18. A repeat CT of the head was conducted and it revealed a more confluent acute infarct in the left basal ganglia and corona radiata when compared to diffusion imaging.    Assessment / Plan / Recommendation Clinical Impression  SLP was consulted for bedside swallow evaluation secondary to Edward Glass demonstrating increased weakness and swallow function being questioned. Oral mechanism exam did not reveal any significant difference from that which was seen on 08/31/18. He tolerated 1/2 tsp boluses of puree solids, dysphagia 2 solids, and nectar thick liquids via cup and straw without overt s/sx of aspiration. However, he demonstrated coughing and throat clearing with thin liquids (via cup and straw), full tsp boluses of puree, and regular  texture solids. Coughing was most severe and extensive with regular texture solids. Reduced labial stripping was noted on the right and mild oral residue was observed with solids but cleared with a liquid wash. A dysphagia 2 diet with nectar thick liquids is recommended at this time with observance of swallowing precautions. A modified barium swallow study is scheduled for 09/02/18 to further assess the structural and functional integrity of the swallow mechanism and determine the least restrictive diet.  Edward Glass and his family were educated regarding the swallow mechanism as well as the Edward Glass's suspected swallowing deficits based on his presenting symptoms. Picture illustrations were used to facilitate educated and the Edward Glass's family verbalized understanding.  SLP Visit Diagnosis: Dysphagia, oropharyngeal phase (R13.12)    Aspiration Risk       Diet Recommendation Dysphagia 2 (Fine chop);Nectar-thick liquid   Liquid Administration via: Cup;No straw Medication Administration: Crushed with puree Supervision: Full supervision/cueing for compensatory strategies Compensations: Slow rate;Small sips/bites;Multiple dry swallows after each bite/sip Postural Changes: Seated upright at 90 degrees;Remain upright for at least 30 minutes after po intake    Other  Recommendations Oral Care Recommendations: Oral care BID Other Recommendations: Order thickener from pharmacy   Follow up Recommendations Inpatient Rehab      Frequency and Duration min 2x/week          Prognosis Prognosis for Safe Diet Advancement: Good Barriers to Reach Goals: Language deficits      Swallow Study   General Date of Onset: 08/31/18 HPI: Edward Glass is a 63 y.o. male with medical history significant of hypogonadism, possible BPH, who presents with confusion, difficulty speaking, right facial droop. MRI of 08/30/18 revealed multiple small foci of acute/early  subacute infarction are present in the left MCA distribution concentrated in left basal  ganglia inclusive of the corona radiata and posterior limb of internal capsule. Edward Glass demonstrated increased weakness on 08/31/18. A repeat CT of the head was conducted and it revealed a more confluent acute infarct in the left basal ganglia and corona radiata when compared to diffusion imaging.  Type of Study: Bedside Swallow Evaluation Previous Swallow Assessment: [facility] swallow screen Diet Prior to this Study: Dysphagia 2 (chopped);Nectar-thick liquids Temperature Spikes Noted: No Respiratory Status: Room air History of Recent Intubation: No Behavior/Cognition: Alert;Cooperative;Pleasant mood;Impulsive Oral Cavity Assessment: Within Functional Limits Oral Care Completed by SLP: Yes Oral Cavity - Dentition: Adequate natural dentition Self-Feeding Abilities: Needs assist Patient Positioning: Upright in bed;Postural control adequate for testing Baseline Vocal Quality: Low vocal intensity Volitional Cough: Strong Volitional Swallow: Able to elicit    Oral/Motor/Sensory Function Overall Oral Motor/Sensory Function: Moderate impairment Facial ROM: Reduced right;Suspected CN VII (facial) dysfunction Facial Symmetry: Abnormal symmetry right;Suspected CN VII (facial) dysfunction Facial Strength: Reduced right Facial Sensation: Within Functional Limits Lingual ROM: Within Functional Limits Lingual Symmetry: Within Functional Limits Lingual Strength: Reduced;Suspected CN XII (hypoglossal) dysfunction Lingual Sensation: Within Functional Limits Mandible: Within Functional Limits   Ice Chips Ice chips: Within functional limits Presentation: Spoon   Thin Liquid Thin Liquid: Impaired Presentation: Cup;Spoon;Straw Pharyngeal  Phase Impairments: Suspected delayed Swallow;Throat Clearing - Delayed;Throat Clearing - Immediate;Cough - Immediate    Nectar Thick Nectar Thick Liquid: Within functional limits Presentation: Cup;Straw   Honey Thick Honey Thick Liquid: Not tested   Puree Puree:  Impaired Presentation: Spoon Oral Phase Impairments: Reduced labial seal Oral Phase Functional Implications: Right anterior spillage Pharyngeal Phase Impairments: Suspected delayed Swallow;Multiple swallows;Throat Clearing - Delayed;Cough - Immediate(With full-tsp boluses)   Solid     Solid: Impaired Presentation: Self Fed Pharyngeal Phase Impairments: Multiple swallows;Cough - Immediate;Cough - Delayed;Throat Clearing - Delayed     Abbas Beyene I. Hardin Negus, [place], [place] Office number [phone] Pager [place] 09/01/2018,5:39 PM

## 2018-09-01 NOTE — Progress Notes (Signed)
PROGRESS NOTE    Edward Glass   FUX:[id number]  DOB: September 13, 1955  DOA: 08/30/2018 PCP: Lurline Hare, MD   Brief Narrative:  Edward Glass is a 63 y.o. male with medical history significant of hypogonadism, possible BPH, who presents with   difficulty speaking, right facial droop and right arm and leg weakness.   He was not a candidate for TPA. Given Aspirin and a Plavix load.   Subjective: Weakness is worse today. Speech is about the same. No other complaints.     Assessment & Plan:   Principal Problem:   CVA (cerebral vascular accident) - CTA head/neck> Short-segment severe mid left M1 stenosis, likely similar as compared to previous MRI from 11/19/2008. - MRI> Multiple small foci of acute/early subacute infarction are present in the left MCA distribution concentrated in left basal ganglia inclusive of the corona radiata and posterior limb of internal capsule. - LDL 104- Lipitor added - A1c 6.2- discuss diet control and repeat A1c in about 1 month- will ask for dietician consult  - PT eval completed- recommended CIR - I have consulted them - ECHO shows no thrombus - TEE and loop recorder pending - Neuro suspecting embolic infarct - symptoms have worsened - CT head> More confluent acute infarct in the left basal ganglia and corona radiata  Active Problems: BPH - Alfuzosin continued  Hypogonadism   Time spent in minutes: 35 DVT prophylaxis: Lovenox Code Status: Full code Family Communication: daughters, brother Disposition Plan: to be determined Consultants:   Neuro Procedures:   ECHO 1. The left ventricle has normal systolic function of 02-54%. The cavity size was normal. There is no increased left ventricular wall thickness. Echo evidence of impaired diastolic relaxation.  2. The right ventricle has normal systolic function. The cavity was normal. There is no increase in right ventricular wall thickness.  3. The mitral valve is normal in structure.  No evidence of mitral valve stenosis. No significant mitral regurgitation.  4. The tricuspid valve is normal in structure.  5. The aortic valve is tricuspid There is mild calcification of the aortic valve. No aortic stenosis.  6. The pulmonic valve was normal in structure.  7. The aortic root and ascending aorta are normal in size and structure.  8. No evidence of left ventricular regional wall motion abnormalities.  9. The inferior vena cava is normal in size with greater than 50% respiratory variability. 10. No complete TR doppler jet so unable to estimate PA systolic pressure.  Antimicrobials:  Anti-infectives (From admission, onward)   None       Objective: Vitals:   08/31/18 2345 09/01/18 0351 09/01/18 0824 09/01/18 1228  BP: 118/71 111/67 120/67 112/75  Pulse: (!) 57 (!) 52 (!) 57 61  Resp: 17 17 15 20   Temp: 98.3 F (36.8 C) 97.8 F (36.6 C) 98.7 F (37.1 C) 98.8 F (37.1 C)  TempSrc: Oral Oral Oral Oral  SpO2: 95% 95% 94% 98%  Weight:      Height:        Intake/Output Summary (Last 24 hours) at 09/01/2018 1420 Last data filed at 09/01/2018 0700 Gross per 24 hour  Intake -  Output 850 ml  Net -850 ml   Filed Weights   08/30/18 1546  Weight: 79.3 kg    Examination: General exam: Appears comfortable  HEENT: PERRLA, oral mucosa moist, no sclera icterus or thrush Respiratory system: Clear to auscultation. Respiratory effort normal. Cardiovascular system: S1 & S2 heard,  No murmurs  Gastrointestinal  system: Abdomen soft, non-tender, nondistended. Normal bowel sound. No organomegaly Central nervous system: Alert and oriented. 1/5 in right arm and leg- continued dysarthria  Extremities: No cyanosis, clubbing or edema Skin: No rashes or ulcers Psychiatry:  Mood & affect appropriate.     Data Reviewed: I have personally reviewed following labs and imaging studies  CBC: Recent Labs  Lab 08/27/18 1132 08/30/18 1546  WBC 5.4 6.5  NEUTROABS 3.4 3.8  HGB 15.2  15.4  HCT 46.8 48.5  MCV 90.7 91.9  PLT 260 712   Basic Metabolic Panel: Recent Labs  Lab 08/27/18 1255 08/27/18 1412 08/30/18 1546  NA 136 137 140  K 6.3* 4.0 3.7  CL 107 107 105  CO2 23 23 23   GLUCOSE 103* 102* 100*  BUN 23 23 17   CREATININE 0.81 0.78 0.91  CALCIUM 8.6* 9.0 9.4   GFR: Estimated Creatinine Clearance: 80.6 mL/min (by C-G formula based on SCr of 0.91 mg/dL). Liver Function Tests: Recent Labs  Lab 08/30/18 1546  AST 40  ALT 35  ALKPHOS 65  BILITOT 0.4  PROT 7.1  ALBUMIN 3.8   No results for input(s): LIPASE, AMYLASE in the last 168 hours. No results for input(s): AMMONIA in the last 168 hours. Coagulation Profile: No results for input(s): INR, PROTIME in the last 168 hours. Cardiac Enzymes: No results for input(s): CKTOTAL, CKMB, CKMBINDEX, TROPONINI in the last 168 hours. BNP (last 3 results) No results for input(s): PROBNP in the last 8760 hours. HbA1C: Recent Labs    08/31/18 0447  HGBA1C 6.2*   CBG: No results for input(s): GLUCAP in the last 168 hours. Lipid Profile: Recent Labs    08/31/18 0447  CHOL 173  HDL 52  LDLCALC 104*  TRIG 86  CHOLHDL 3.3   Thyroid Function Tests: No results for input(s): TSH, T4TOTAL, FREET4, T3FREE, THYROIDAB in the last 72 hours. Anemia Panel: No results for input(s): VITAMINB12, FOLATE, FERRITIN, TIBC, IRON, RETICCTPCT in the last 72 hours. Urine analysis:    Component Value Date/Time   COLORURINE STRAW (A) 08/30/2018 1801   APPEARANCEUR CLEAR 08/30/2018 1801   LABSPEC 1.014 08/30/2018 1801   PHURINE 5.0 08/30/2018 1801   GLUCOSEU NEGATIVE 08/30/2018 1801   HGBUR SMALL (A) 08/30/2018 1801   BILIRUBINUR NEGATIVE 08/30/2018 1801   BILIRUBINUR neg 10/03/2013 1529   KETONESUR NEGATIVE 08/30/2018 1801   PROTEINUR NEGATIVE 08/30/2018 1801   UROBILINOGEN 0.2 10/03/2013 1529   NITRITE NEGATIVE 08/30/2018 1801   LEUKOCYTESUR NEGATIVE 08/30/2018 1801   Sepsis  Labs: @LABRCNTIP (procalcitonin:4,lacticidven:4) )No results found for this or any previous visit (from the past 240 hour(s)).       Radiology Studies: Ct Angio Head W Or Wo Contrast  Result Date: 08/30/2018 CLINICAL DATA:  Initial evaluation for acute speech difficulty, right facial weakness. EXAM: CT ANGIOGRAPHY HEAD AND NECK CT PERFUSION BRAIN TECHNIQUE: Multidetector CT imaging of the head and neck was performed using the standard protocol during bolus administration of intravenous contrast. Multiplanar CT image reconstructions and MIPs were obtained to evaluate the vascular anatomy. Carotid stenosis measurements (when applicable) are obtained utilizing NASCET criteria, using the distal internal carotid diameter as the denominator. Multiphase CT imaging of the brain was performed following IV bolus contrast injection. Subsequent parametric perfusion maps were calculated using RAPID software. CONTRAST:  159mL ISOVUE-370 IOPAMIDOL (ISOVUE-370) INJECTION 76% COMPARISON:  Prior noncontrast CT from earlier same Glass. FINDINGS: CTA NECK FINDINGS Aortic arch: Visualized aortic arch of normal caliber with normal branch pattern. No hemodynamically significant stenosis  about the origin of the great vessels. Visualized subclavian arteries widely patent. Right carotid system: Right common carotid artery patent from its origin to the bifurcation without stenosis. Minimal mixed plaque about the right bifurcation without stenosis. Short-segment mild stenosis of approximately 20% by NASCET criteria just distally within the proximal right ICA. Right ICA otherwise widely patent to the skull base without stenosis, dissection, or occlusion. Left carotid system: Left common carotid artery patent from its origin to the bifurcation without stenosis. No significant atheromatous narrowing about the left bifurcation. Left ICA widely patent from the bifurcation to the skull base without stenosis, dissection, or occlusion.  Vertebral arteries: Both of the vertebral arteries arise from the subclavian arteries. Vertebral arteries widely patent within the neck without stenosis, dissection, or occlusion. Skeleton: No acute osseous finding. No discrete lytic or blastic osseous lesions. Mild to moderate cervical spondylolysis at C4-5 through C6-7. Prominent right-sided facet arthrosis noted at C3-4. 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 Soft tissues of the neck demonstrate no other acute finding. Upper chest: Layering fluid noted within the upper esophageal lumen. Visualized upper chest demonstrates no other acute finding. Partially visualized lungs are clear. Review of the MIP images confirms the above findings CTA HEAD FINDINGS Anterior circulation: Petrous ICAs widely patent bilaterally. Cavernous and supraclinoid segments widely patent without stenosis. 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 ICA termini well perfused distally. A1 segments patent bilaterally. Right A1 hypoplastic, likely accounting for the dominant left ICA as compared to the right. Normal anterior communicating artery. Anterior cerebral arteries widely patent to their distal aspects without stenosis. Right M1 widely patent without stenosis. Normal right MCA bifurcation. Distal right MCA branches well perfused. Left M1 patent proximally. 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 Upon review of prior MRI from 2010, this is likely chronic in nature. Stenosis measures approximately 3 mm in length. Left M1 patent distally. Normal left MCA bifurcation. Left MCA branches are opacified distally, although overall attenuated as compared to the right due to the severe M1 stenosis. Posterior circulation: Vertebral arteries patent to the vertebrobasilar junction without stenosis. Posterior inferior cerebral arteries patent  bilaterally. Proximal and mid basilar artery diminutive but widely patent without stenosis. Persistent left-sided trigeminal artery supplies the distal basilar artery which is more robust in caliber. Superior cerebral arteries patent bilaterally. Both of the posterior cerebral arteries primarily supplied via the basilar and are well perfused to their distal aspects. Venous sinuses: Grossly patent, although limited in assessment due to arterial timing the contrast bolus. Anatomic variants: Persistent left trigeminal artery. No intracranial aneurysm. Delayed phase: Not performed. Review of the MIP images confirms the above findings CT Brain Perfusion Findings: CBF (<30%) Volume: 55mL Perfusion (Tmax>6.0s) volume: 166mL Mismatch Volume: 17mL Infarction Location:No core infarct identified by CT perfusion. Large perfusion mismatch throughout the left MCA territory due to the severe left M1 stenosis. IMPRESSION: 1. Negative CTA for large vessel occlusion. No core infarct evident by CT perfusion. 2. Short-segment severe mid left M1 stenosis, likely similar as compared to previous MRI from 11/19/2008. Associated delayed perfusion throughout the left MCA territory distally. 3. No other hemodynamically significant or correctable stenosis within the major arterial vasculature of the head and neck. 4. Persistent left trigeminal artery. Finding discussed with Dr. Leonel Ramsay by telephone at approximately 9:45 p.m. on 08/30/2018. Electronically Signed   By: Jeannine Boga M.D.   On: 08/30/2018  22:59   Dg Chest 2 View  Result Date: 08/30/2018 CLINICAL DATA:  63 year old male with slurred speech and cough today. EXAM: CHEST - 2 VIEW COMPARISON:  08/27/2018 chest radiographs and earlier. FINDINGS: Lower lung volumes on both views. No pneumothorax, pulmonary edema, pleural effusion or consolidation. Mild basilar crowding of markings. Stable cardiac size at the upper limits of normal. Other mediastinal contours are within  normal limits. Visualized tracheal air column is within normal limits. No acute osseous abnormality identified. Negative visible bowel gas pattern. Previous ventral abdominal hernia repair with mesh. IMPRESSION: Lower lung volumes, otherwise no acute cardiopulmonary abnormality. Electronically Signed   By: Genevie Ann M.D.   On: 08/30/2018 17:15   Ct Head Wo Contrast  Result Date: 09/01/2018 CLINICAL DATA:  Follow-up stroke.  Increased weakness on the right. EXAM: CT HEAD WITHOUT CONTRAST TECHNIQUE: Contiguous axial images were obtained from the base of the skull through the vertex without intravenous contrast. COMPARISON:  Brain MRI from 2 days ago FINDINGS: Brain: Acute infarct in the left deep watershed/lateral lenticulostriate distribution which has a more confluent low-density appearance than seen on prior diffusion imaging. No new territory involvement. No hemorrhagic conversion. Small remote right parietal cortex infarct. No hydrocephalus or masslike finding Vascular: Atherosclerotic calcification Skull: No acute finding Sinuses/Orbits: Right frontal maxillary and ethmoid sinusitis, also seen on prior study. IMPRESSION: More confluent acute infarct in the left basal ganglia and corona radiata when compared to diffusion imaging 2 days ago. No new distribution infarct or hemorrhagic conversion. Electronically Signed   By: Monte Fantasia M.D.   On: 09/01/2018 09:17   Ct Head Wo Contrast  Result Date: 08/30/2018 CLINICAL DATA:  63 year old male with abnormal speech onset today. EXAM: CT HEAD WITHOUT CONTRAST TECHNIQUE: Contiguous axial images were obtained from the base of the skull through the vertex without intravenous contrast. COMPARISON:  Brain MRI 11/19/2008. FINDINGS: Brain: A small area of right parietal lobe cortical encephalomalacia is redemonstrated on series 3, image 22. Cerebral volume has not significantly changed. New abnormal hypodensity in the left posterior limb internal capsule and/or  lentiform (series 3, image 14). New hypodensity also in the left caudate and anterior lentiform on image 17. No acute intracranial hemorrhage identified. No midline shift, mass effect, or evidence of intracranial mass lesion. No ventriculomegaly. No acute cortically based infarct identified. Incidental tentorial dural calcifications. Vascular: No suspicious intracranial vascular hyperdensity. Skull: Negative. Sinuses/Orbits: Right frontal ethmoid and maxillary sinus disease with fluid levels is new since 2010. The right sphenoid and left paranasal sinuses are spared. Tympanic cavities and mastoids are clear. Other: Mildly Disconjugate gaze, otherwise negative orbits. Visualized scalp soft tissues are within normal limits. IMPRESSION: 1. 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 2. Right [facility] pattern paranasal sinusitis. Electronically Signed   By: Genevie Ann M.D.   On: 08/30/2018 17:39   Ct Angio Neck W Or Wo Contrast  Result Date: 08/30/2018 CLINICAL DATA:  Initial evaluation for acute speech difficulty, right facial weakness. EXAM: CT ANGIOGRAPHY HEAD AND NECK CT PERFUSION BRAIN TECHNIQUE: Multidetector CT imaging of the head and neck was performed using the standard protocol during bolus administration of intravenous contrast. Multiplanar CT image reconstructions and MIPs were obtained to evaluate the vascular anatomy. Carotid stenosis measurements (when applicable) are obtained utilizing NASCET criteria, using the distal internal carotid diameter as the denominator. Multiphase CT imaging of the brain was performed following IV bolus contrast injection. Subsequent parametric perfusion maps were calculated using RAPID  software. CONTRAST:  180mL ISOVUE-370 IOPAMIDOL (ISOVUE-370) INJECTION 76% COMPARISON:  Prior noncontrast CT from earlier same Glass. FINDINGS: CTA NECK FINDINGS Aortic arch: Visualized aortic arch of normal caliber with normal branch pattern. No hemodynamically  significant stenosis about the origin of the great vessels. Visualized subclavian arteries widely patent. Right carotid system: Right common carotid artery patent from its origin to the bifurcation without stenosis. Minimal mixed plaque about the right bifurcation without stenosis. Short-segment mild stenosis of approximately 20% by NASCET criteria just distally within the proximal right ICA. Right ICA otherwise widely patent to the skull base without stenosis, dissection, or occlusion. Left carotid system: Left common carotid artery patent from its origin to the bifurcation without stenosis. No significant atheromatous narrowing about the left bifurcation. Left ICA widely patent from the bifurcation to the skull base without stenosis, dissection, or occlusion. Vertebral arteries: Both of the vertebral arteries arise from the subclavian arteries. Vertebral arteries widely patent within the neck without stenosis, dissection, or occlusion. Skeleton: No acute osseous finding. No discrete lytic or blastic osseous lesions. Mild to moderate cervical spondylolysis at C4-5 through C6-7. Prominent right-sided facet arthrosis noted at C3-4. 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 Soft tissues of the neck demonstrate no other acute finding. Upper chest: Layering fluid noted within the upper esophageal lumen. Visualized upper chest demonstrates no other acute finding. Partially visualized lungs are clear. Review of the MIP images confirms the above findings CTA HEAD FINDINGS Anterior circulation: Petrous ICAs widely patent bilaterally. Cavernous and supraclinoid segments widely patent without stenosis. 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 ICA termini well perfused distally. A1 segments patent bilaterally. Right A1 hypoplastic, likely accounting for the dominant left ICA as compared to the right. Normal anterior communicating artery. Anterior cerebral arteries widely patent to  their distal aspects without stenosis. Right M1 widely patent without stenosis. Normal right MCA bifurcation. Distal right MCA branches well perfused. Left M1 patent proximally. 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 Upon review of prior MRI from 2010, this is likely chronic in nature. Stenosis measures approximately 3 mm in length. Left M1 patent distally. Normal left MCA bifurcation. Left MCA branches are opacified distally, although overall attenuated as compared to the right due to the severe M1 stenosis. Posterior circulation: Vertebral arteries patent to the vertebrobasilar junction without stenosis. Posterior inferior cerebral arteries patent bilaterally. Proximal and mid basilar artery diminutive but widely patent without stenosis. Persistent left-sided trigeminal artery supplies the distal basilar artery which is more robust in caliber. Superior cerebral arteries patent bilaterally. Both of the posterior cerebral arteries primarily supplied via the basilar and are well perfused to their distal aspects. Venous sinuses: Grossly patent, although limited in assessment due to arterial timing the contrast bolus. Anatomic variants: Persistent left trigeminal artery. No intracranial aneurysm. Delayed phase: Not performed. Review of the MIP images confirms the above findings CT Brain Perfusion Findings: CBF (<30%) Volume: 28mL Perfusion (Tmax>6.0s) volume: 175mL Mismatch Volume: 132mL Infarction Location:No core infarct identified by CT perfusion. Large perfusion mismatch throughout the left MCA territory due to the severe left M1 stenosis. IMPRESSION: 1. Negative CTA for large vessel occlusion. No core infarct evident by CT perfusion. 2. Short-segment severe mid left M1 stenosis, likely similar as compared to previous MRI from 11/19/2008. Associated delayed perfusion throughout the left MCA territory  distally. 3. No other hemodynamically significant or correctable stenosis within the  major arterial vasculature of the head and neck. 4. Persistent left trigeminal artery. Finding discussed with Dr. Leonel Ramsay by telephone at approximately 9:45 p.m. on 08/30/2018. Electronically Signed   By: Jeannine Boga M.D.   On: 08/30/2018 22:59   Mr Brain Wo Contrast  Result Date: 08/30/2018 CLINICAL DATA:  63 y/o  M; right-sided weakness. EXAM: MRI HEAD WITHOUT CONTRAST TECHNIQUE: Multiplanar, multiecho pulse sequences of the brain and surrounding structures were obtained without intravenous contrast. COMPARISON:  08/30/2018 CT head, CT perfusion head, and CTA head. FINDINGS: Brain: Multiple foci of reduced diffusion are present within the left caudate body, left mid and posterior corona radiata, left posterior limb of internal capsule extending into left putamen, as well as several additional small foci of the temporoparietal periventricular white matter and the left frontal operculum compatible with acute/early subacute infarction. No associated hemorrhage or mass effect. Infarcts are T2 FLAIR hyperintense. No acute hemorrhage. No extra-axial collection, hydrocephalus, mass effect, or herniation. Small chronic infarcts in the right parietal cortex and right occipital periventricular white matter. Mild chronic microvascular ischemic changes of the brain. Mild brain parenchymal volume loss. Vascular: Please refer to CT angiogram of the head and neck. Skull and upper cervical spine: Normal marrow signal. Sinuses/Orbits: Right frontal, ethmoid, and maxillary sinus partial opacification and fluid level. No abnormal signal of the mastoid air cells. Orbits are unremarkable. Other: None. IMPRESSION: 1. Multiple small foci of acute/early subacute infarction are present in the left MCA distribution concentrated in left basal ganglia inclusive of the corona radiata and posterior limb of internal capsule. No hemorrhage or  mass effect. 2. Mild chronic microvascular ischemic changes and volume loss of the brain. Small chronic infarcts in right parietal and occipital lobes. 3. Right frontal, ethmoid, and maxillary sinus disease is a right middle meatus obstructive pattern, direct visualization recommended. These results were called by telephone at the time of interpretation on 08/30/2018 at 10:46 pm to Dr. Leonel Ramsay, who verbally acknowledged these results. Electronically Signed   By: Kristine Garbe M.D.   On: 08/30/2018 22:48   Ct Cerebral Perfusion W Contrast  Result Date: 08/30/2018 CLINICAL DATA:  Initial evaluation for acute speech difficulty, right facial weakness. EXAM: CT ANGIOGRAPHY HEAD AND NECK CT PERFUSION BRAIN TECHNIQUE: Multidetector CT imaging of the head and neck was performed using the standard protocol during bolus administration of intravenous contrast. Multiplanar CT image reconstructions and MIPs were obtained to evaluate the vascular anatomy. Carotid stenosis measurements (when applicable) are obtained utilizing NASCET criteria, using the distal internal carotid diameter as the denominator. Multiphase CT imaging of the brain was performed following IV bolus contrast injection. Subsequent parametric perfusion maps were calculated using RAPID software. CONTRAST:  129mL ISOVUE-370 IOPAMIDOL (ISOVUE-370) INJECTION 76% COMPARISON:  Prior noncontrast CT from earlier same Glass. FINDINGS: CTA NECK FINDINGS Aortic arch: Visualized aortic arch of normal caliber with normal branch pattern. No hemodynamically significant stenosis about the origin of the great vessels. Visualized subclavian arteries widely patent. Right carotid system: Right common carotid artery patent from its origin to the bifurcation without stenosis. Minimal mixed plaque about the right bifurcation without stenosis. Short-segment mild stenosis of approximately 20% by NASCET criteria just distally within the proximal right ICA. Right ICA  otherwise widely patent to the skull base without stenosis, dissection, or occlusion. Left carotid system: Left common carotid artery patent from its origin to the bifurcation without stenosis. No significant atheromatous narrowing about the left bifurcation. Left ICA widely patent from the bifurcation to the skull base without  stenosis, dissection, or occlusion. Vertebral arteries: Both of the vertebral arteries arise from the subclavian arteries. Vertebral arteries widely patent within the neck without stenosis, dissection, or occlusion. Skeleton: No acute osseous finding. No discrete lytic or blastic osseous lesions. Mild to moderate cervical spondylolysis at C4-5 through C6-7. Prominent right-sided facet arthrosis noted at C3-4. 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 Soft tissues of the neck demonstrate no other acute finding. Upper chest: Layering fluid noted within the upper esophageal lumen. Visualized upper chest demonstrates no other acute finding. Partially visualized lungs are clear. Review of the MIP images confirms the above findings CTA HEAD FINDINGS Anterior circulation: Petrous ICAs widely patent bilaterally. Cavernous and supraclinoid segments widely patent without stenosis. 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 ICA termini well perfused distally. A1 segments patent bilaterally. Right A1 hypoplastic, likely accounting for the dominant left ICA as compared to the right. Normal anterior communicating artery. Anterior cerebral arteries widely patent to their distal aspects without stenosis. Right M1 widely patent without stenosis. Normal right MCA bifurcation. Distal right MCA branches well perfused. Left M1 patent proximally. 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 Upon review of prior MRI from 2010, this is likely chronic in nature. Stenosis  measures approximately 3 mm in length. Left M1 patent distally. Normal left MCA bifurcation. Left MCA branches are opacified distally, although overall attenuated as compared to the right due to the severe M1 stenosis. Posterior circulation: Vertebral arteries patent to the vertebrobasilar junction without stenosis. Posterior inferior cerebral arteries patent bilaterally. Proximal and mid basilar artery diminutive but widely patent without stenosis. Persistent left-sided trigeminal artery supplies the distal basilar artery which is more robust in caliber. Superior cerebral arteries patent bilaterally. Both of the posterior cerebral arteries primarily supplied via the basilar and are well perfused to their distal aspects. Venous sinuses: Grossly patent, although limited in assessment due to arterial timing the contrast bolus. Anatomic variants: Persistent left trigeminal artery. No intracranial aneurysm. Delayed phase: Not performed. Review of the MIP images confirms the above findings CT Brain Perfusion Findings: CBF (<30%) Volume: 20mL Perfusion (Tmax>6.0s) volume: 171mL Mismatch Volume: 110mL Infarction Location:No core infarct identified by CT perfusion. Large perfusion mismatch throughout the left MCA territory due to the severe left M1 stenosis. IMPRESSION: 1. Negative CTA for large vessel occlusion. No core infarct evident by CT perfusion. 2. Short-segment severe mid left M1 stenosis, likely similar as compared to previous MRI from 11/19/2008. Associated delayed perfusion throughout the left MCA territory distally. 3. No other hemodynamically significant or correctable stenosis within the major arterial vasculature of the head and neck. 4. Persistent left trigeminal artery. Finding discussed with Dr. Leonel Ramsay by telephone at approximately 9:45 p.m. on 08/30/2018. Electronically Signed   By: Jeannine Boga M.D.   On: 08/30/2018 22:59      Scheduled Meds: . alfuzosin  10 mg Oral Once per Glass on Mon  Wed Fri  . aspirin EC  81 mg Oral Daily  . atorvastatin  40 mg Oral q1800  . clopidogrel  75 mg Oral Daily  . enoxaparin (LOVENOX) injection  40 mg Subcutaneous Q24H  . FLUoxetine  20 mg Oral Daily  . multivitamin with minerals  1 tablet Oral Daily   Continuous Infusions: . sodium chloride 500 mL (09/01/18 1032)     LOS: 1 Glass      Debbe Odea, MD [facility] Pager:  [email or web address] Password [email or web address] 09/01/2018, 2:20 PM

## 2018-09-01 NOTE — Progress Notes (Signed)
OT Cancellation Note  Patient Details Name: Edward Glass MRN: [id number] DOB: 01-15-1956   Cancelled Treatment:    Reason Eval/Treat Not Completed: Medical issues which prohibited therapy.  Pt with neuro changes overnight.  Spoke with MD who requests therapy hold today.  Will check back tomorrow.  Lucille Passy, OTR/L Acute Rehabilitation Services Pager [phone] Office [place], [place] 09/01/2018, 10:33 AM

## 2018-09-01 NOTE — H&P (Signed)
Physical Medicine and Rehabilitation Admission H&P    Chief Complaint  Patient presents with  . Aphasia  : HPI: Edward Glass is a 63 year old right-handed male with history of hypogonadism, BPH. Per chart review lives alone. 2 level home to steps two entry. Patient works as a Radio producer.He does have family in the area with good support and plans to stay with his daughter on discharge.. Presented to 08/30/2018 with right-sided weakness/fall and slurred speech. Cranial CT scan showed age indeterminate small vessel ischemia in the left basal ganglia new since 2010. CT perfusion scan as well as CTA head and neck left M1 near occlusion. MRI confirmed multi small foci of acute early subacute infarction present in the left MCA distribution concentrated in the left basal ganglia. Patient did not receive TPA. Echocardiogram with ejection fraction of 60% and normal systolic function.Noted on 09/01/2018 patient with increase weakness of the right side and dysarthria with follow-up cranial CT scan showing more confluent acute infarct in the left basal ganglia and corona radiata when compared to diffusion imaging 2 days prior. No new distribution infarction or hemorrhagic conversion. Presently on aspirin and Plavix for CVA prophylaxis. Subcutaneous Lovenox for DVT prophylaxis. Modified barium swallow completed presently on a dysphagia #2 nectar thick liquid diet. TEE 05/15/2019 showed ejection fraction of 55% as well as a small PFO/bubble crossover noted during Valsalva. Loop recorder was also placed. Patient with x-rays of right hand after recent fall related to CVA showed possible fracture of the triquetrum with conservative care per orthopedic services Dr. Stann Mainland and weightbearing as tolerated and placed in a splint. Therapy evaluations completed with recommendations of physical medicine rehabilitation consult. Patient was admitted for a comprehensive rehabilitation program.  Review of Systems    Constitutional: Negative for chills and fever.  HENT: Negative for hearing loss.   Eyes: Negative for blurred vision.  Respiratory: Negative for cough and shortness of breath.   Cardiovascular: Negative for chest pain and palpitations.  Gastrointestinal: Positive for constipation. Negative for nausea and vomiting.  Genitourinary: Positive for urgency. Negative for dysuria, flank pain and hematuria.  Musculoskeletal: Positive for myalgias.  Skin: Negative for rash.  Neurological: Positive for sensory change, speech change and focal weakness.  All other systems reviewed and are negative.  Past Medical History:  Diagnosis Date  . Atypical chest pain 08/10/2013  . Elevated aspartate aminotransferase level 06/16/2015  . Excessive salivation 06/16/2015  . H/O nutritional disorder 06/16/2015  . 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 Screening for prostate cancer 08/21/2014  . 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 HERNIA REPAIR  2006  . left tricep surgery     Family History  Problem Relation Age of Onset  . Kidney failure Mother   . Cancer Mother   . Prostate cancer Father   . Cancer Father   . Diabetes Father   . Colon cancer Neg Hx   . Esophageal cancer Neg Hx   . Rectal cancer Neg Hx   . Stomach cancer Neg Hx    Social History:  reports that he has never smoked. He has never used smokeless tobacco. He reports that he does not drink alcohol or use drugs. Allergies: No Known Allergies Medications Prior to Admission  Medication Sig Dispense Refill  . alfuzosin (UROXATRAL) 10 MG 24 hr tablet Take 10 mg by mouth 3 (three) times a week.    Marland Kitchen aspirin 81 MG tablet Take  81 mg by mouth daily.    Marland Kitchen MILK THISTLE PO Take 2 tablets by mouth daily.    . Misc Natural Products (SAW PALMETTO) CAPS Take 2-4 capsules by mouth daily.    . multivitamin (ONE-A-DAY MEN'S) TABS tablet Take 1 tablet by mouth daily.    .  sildenafil (REVATIO) 20 MG tablet 2-5 pills as needed for ED symptoms (Patient not taking: Reported on 08/30/2018) 50 tablet 5    Drug Regimen Review Drug regimen was reviewed and remains appropriate with no significant issues identified  Home: Home Living Family/patient expects to be discharged to:: Private residence Living Arrangements: Alone Available Help at Discharge: Family Type of Home: House Home Access: Stairs to enter Technical brewer of Steps: 2 Entrance Stairs-Rails: None Home Layout: Two level Alternate Level Stairs-Number of Steps: 10 Alternate Level Stairs-Rails: Left Bathroom Shower/Tub: Multimedia programmer: Standard Home Equipment: Industrial/product designer History: Prior Function Level of Independence: Independent Comments: works as a Production assistant, radio Status:  Mobility: Talent bed mobility: Needs Assistance Bed Mobility: Rolling Rolling: Max assist, Mod assist Supine to sit: Max assist Sit to supine: Mod assist General bed mobility comments: Performed x 5 reps of rolling to L side.  Pt required Hand over hand placement to assist R UE.  Pt required cues to look R and PTA assisted [name] into hooklying to push through R LE to roll to the Left side.  mod-max assistance.   Transfers Overall transfer level: Needs assistance Equipment used: 2 person hand held assist Transfers: Sit to/from Stand, Stand Pivot Transfers Sit to Stand: Mod assist Stand pivot transfers: Max assist General transfer comment: pt progressing from mod A x2 to min A with sit<>stands; therapist blocking R knee throughout; pt with very poor eccentric control to return to sitting Ambulation/Gait Ambulation/Gait assistance: Mod assist, +2 physical assistance, +2 safety/equipment Gait Distance (Feet): 20 Feet Assistive device: 1 person hand held assist Gait Pattern/deviations: Step-to pattern, Decreased step length - right, Decreased step length - left,  Decreased stance time - right, Decreased weight shift to right, Narrow base of support General Gait Details: pt only able to take a few pivotal steps towards his L side with mod-max A x2 Gait velocity: decreased    ADL: ADL Overall ADL's : Needs assistance/impaired Eating/Feeding: Minimal assistance, Modified independent, Sitting Eating/Feeding Details (indicate cue type and reason): provided built-up foam for use with feeding utensils Grooming: Sitting, Moderate assistance Upper Body Bathing: Moderate assistance, Sitting Lower Body Bathing: Maximal assistance, Sit to/from stand Upper Body Dressing : Maximal assistance, Sitting Lower Body Dressing: Maximal assistance Toilet Transfer: Maximal assistance, Stand-pivot Toilet Transfer Details (indicate cue type and reason): walked halfway to bathroom this session Toileting- Clothing Manipulation and Hygiene: Maximal assistance Tub/ Shower Transfer: Moderate assistance, +2 for physical assistance Functional mobility during ADLs: Moderate assistance, +2 for physical assistance, +2 for safety/equipment General ADL Comments: Pt still with significant right UE and LE hemiparesis.  He also demonstrates right inattention and neglect.  Mod assist for rolling to the right side with max assist for sidelying to sit EOB.  He was able to maintain static sitting balance with min assist for dynamic sitting balance while performing weightbearing tasks through the RUE and reaching to the right across his body with the LUE.  Max assist for sit to stand with max assist for taking 4-5 steps up toward the tops of the bed.  Pt with increased right wrist pain with flexion so wrist cock-up  splint donned for support.  Educated wife on positioning of the splint and donning/doffing.  Also educated her on PROM exercises for the RUE shoulder, elbow, forearm, and fingers.  Pt returned to bed with mod assist in preparation for transfer for TEE.    Cognition: Cognition Overall  Cognitive Status: Impaired/Different from baseline Arousal/Alertness: Awake/alert Orientation Level: Oriented X4 Cognition Arousal/Alertness: Awake/alert Behavior During Therapy: Flat affect Overall Cognitive Status: Impaired/Different from baseline Area of Impairment: Following commands, Problem solving Following Commands: Follows one step commands inconsistently(receptive difficulties vs. apraxia) General Comments: difficult to assess due to aphasia; pt able to follow simple one-step commands but required frequent verbal and tactile cueing Difficult to assess due to: Impaired communication  Physical Exam: Blood pressure 134/77, pulse 65, temperature 99.8 F (37.7 C), temperature source Oral, resp. rate 18, height 5\' 5"  (1.651 m), weight 79.3 kg, SpO2 95 %. Physical Exam  Vitals reviewed. Constitutional: He appears well-developed. No distress.  63 year old right-handed male appearing younger than reported age  HENT:  Head: Normocephalic and atraumatic.  Eyes: Pupils are equal, round, and reactive to light. EOM are normal.     Neck: Normal range of motion. No tracheal deviation present. No thyromegaly present.  Cardiovascular: Normal rate and regular rhythm. Exam reveals no friction rub.  No murmur heard. Respiratory: Effort normal. No respiratory distress. He has no rales.  GI: Soft. He exhibits no distension. There is no abdominal tenderness.  Musculoskeletal: Normal range of motion.        General: No deformity or edema.  Neurological:  Alert. Expressive aphasia. Occasionally mutters an appropriate word or phrase, usually an automatic response. Speech of low volume and very dysarthric. Right central 7 and tongue deviation. Decreased gag. RUE 0/5. [name]: 1+ to 2- HF, KE and tr-0/5 ADF/PF. LUE and LLE 4-5/5. Decreased sense of LT and Pain RUE and [name]. DTR's 2+  Skin: Skin is warm and dry.  Psychiatric:  Flat but cooperative.     Results for orders placed or performed during the  hospital encounter of 08/30/18 (from the past 48 hour(s))  CBC     Status: None   Collection Time: 09/04/18  7:44 PM  Result Value Ref Range   WBC 9.9 4.0 - 10.5 K/uL   RBC 5.53 4.22 - 5.81 MIL/uL   Hemoglobin 16.2 13.0 - 17.0 g/dL   HCT 49.5 39.0 - 52.0 %   MCV 89.5 80.0 - 100.0 fL   MCH 29.3 26.0 - 34.0 pg   MCHC 32.7 30.0 - 36.0 g/dL   RDW 12.5 11.5 - 15.5 %   Platelets 349 150 - 400 K/uL   nRBC 0.0 0.0 - 0.2 %    Comment: Performed at [facility], [place] [address], [place], [place] [address]  Creatinine, serum     Status: None   Collection Time: 09/04/18  7:44 PM  Result Value Ref Range   Creatinine, Ser 0.91 0.61 - 1.24 mg/dL   GFR calc non Af Amer >60 >60 mL/min   GFR calc Af Amer >60 >60 mL/min    Comment: Performed at [facility] [address] [place], [place] [address]   Dg Hand Complete Right  Result Date: 09/03/2018 CLINICAL DATA:  Pt was a recent admit into the hospital for a stroke. Pt fell 4 days ago and caught himself with his right hand. Pt c/o today of right hand pain and swelling even after the stroke affected the patient's right side. EXAM: RIGHT HAND - COMPLETE  3+ VIEW COMPARISON:  None. FINDINGS: There is subtle density proximal to the triquetrum/the deformed, also identified along the dorsum of the wrist on the LATERAL view. There is significant soft tissue swelling along the dorsum of the hand. IMPRESSION: 1. Soft tissue swelling. 2. Possible fracture of the triquetrum. Electronically Signed   By: Nolon Nations M.D.   On: 09/03/2018 12:49       Medical Problem List and Plan: 1.  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  DVT Prophylaxis/Anticoagulation: Venous Dopplers lower extremity negative. Subcutaneous Lovenox for DVT prophylaxis 3. Pain Management:  Tylenol as needed. No pain at present 4. Mood:  Prozac 20 mg  daily. Provide ego support as able 5. Neuropsych: This patient is capable of making decisions on his own behalf. 6. Skin/Wound Care:  Routine skin checks 7. Fluids/Electrolytes/Nutrition: Routine in and out's with follow-up chemistries 8. Dysphagia. Dysphagia #2 nectar thick liquids  -advance diet as appropriate 9. Hyperlipidemia. Lipitor 10. Permissive hypertension. Patient on no antihypertensive medications on admission 11. BPH. Uroxatral 10 mg 3 times a day once per day on Monday Wednesday Friday. Check PVRs 12. Possible right wrist fracture/Triquetrum.. Conservative care. Follow-up orthopedic service Dr. Stann Mainland. Weightbearing as tolerated with splint in place      Cathlyn Parsons, PA-C 09/05/2018

## 2018-09-01 NOTE — Progress Notes (Addendum)
STROKE TEAM PROGRESS NOTE     SUBJECTIVE (INTERVAL HISTORY) His significant other  and daughters are at the bedside.  The patient unfortunately had neurological worsening this morning and has for severe aphasia and dense right hemiplegia. CT scan of the head was obtained which shows slight extension of left MCA infarct but no hemorrhage or new unexpected finding  OBJECTIVE Vitals:   08/31/18 2345 09/01/18 0351 09/01/18 0824 09/01/18 1228  BP: 118/71 111/67 120/67 112/75  Pulse: (!) 57 (!) 52 (!) 57 61  Resp: 17 17 15 20   Temp: 98.3 F (36.8 C) 97.8 F (36.6 C) 98.7 F (37.1 C) 98.8 F (37.1 C)  TempSrc: Oral Oral Oral Oral  SpO2: 95% 95% 94% 98%  Weight:      Height:        CBC:  Recent Labs  Lab 08/27/18 1132 08/30/18 1546  WBC 5.4 6.5  NEUTROABS 3.4 3.8  HGB 15.2 15.4  HCT 46.8 48.5  MCV 90.7 91.9  PLT 260 952    Basic Metabolic Panel:  Recent Labs  Lab 08/27/18 1412 08/30/18 1546  NA 137 140  K 4.0 3.7  CL 107 105  CO2 23 23  GLUCOSE 102* 100*  BUN 23 17  CREATININE 0.78 0.91  CALCIUM 9.0 9.4    Lipid Panel:     Component Value Date/Time   CHOL 173 08/31/2018 0447   CHOL 159 08/15/2013 0818   TRIG 86 08/31/2018 0447   TRIG 52 08/15/2013 0818   HDL 52 08/31/2018 0447   HDL 63 08/15/2013 0818   CHOLHDL 3.3 08/31/2018 0447   VLDL 17 08/31/2018 0447   LDLCALC 104 (H) 08/31/2018 0447   LDLCALC 86 08/15/2013 0818   HgbA1c:  Lab Results  Component Value Date   HGBA1C 6.2 (H) 08/31/2018   Urine Drug Screen:     Component Value Date/Time   LABOPIA NONE DETECTED 08/30/2018 1817   COCAINSCRNUR NONE DETECTED 08/30/2018 1817   LABBENZ NONE DETECTED 08/30/2018 1817   AMPHETMU NONE DETECTED 08/30/2018 1817   THCU NONE DETECTED 08/30/2018 1817   LABBARB NONE DETECTED 08/30/2018 1817    Alcohol Level     Component Value Date/Time   ETH <10 08/30/2018 1657    IMAGING   Ct Angio Head W Or Wo Contrast  Result Date: 08/30/2018 CLINICAL DATA:   Initial evaluation for acute speech difficulty, right facial weakness. EXAM: CT ANGIOGRAPHY HEAD AND NECK CT PERFUSION BRAIN TECHNIQUE: Multidetector CT imaging of the head and neck was performed using the standard protocol during bolus administration of intravenous contrast. Multiplanar CT image reconstructions and MIPs were obtained to evaluate the vascular anatomy. Carotid stenosis measurements (when applicable) are obtained utilizing NASCET criteria, using the distal internal carotid diameter as the denominator. Multiphase CT imaging of the brain was performed following IV bolus contrast injection. Subsequent parametric perfusion maps were calculated using RAPID software. CONTRAST:  133mL ISOVUE-370 IOPAMIDOL (ISOVUE-370) INJECTION 76% COMPARISON:  Prior noncontrast CT from earlier same day. FINDINGS: CTA NECK FINDINGS Aortic arch: Visualized aortic arch of normal caliber with normal branch pattern. No hemodynamically significant stenosis about the origin of the great vessels. Visualized subclavian arteries widely patent. Right carotid system: Right common carotid artery patent from its origin to the bifurcation without stenosis. Minimal mixed plaque about the right bifurcation without stenosis. Short-segment mild stenosis of approximately 20% by NASCET criteria just distally within the proximal right ICA. Right ICA otherwise widely patent to the skull base without stenosis, dissection, or occlusion.  Left carotid system: Left common carotid artery patent from its origin to the bifurcation without stenosis. No significant atheromatous narrowing about the left bifurcation. Left ICA widely patent from the bifurcation to the skull base without stenosis, dissection, or occlusion. Vertebral arteries: Both of the vertebral arteries arise from the subclavian arteries. Vertebral arteries widely patent within the neck without stenosis, dissection, or occlusion. Skeleton: No acute osseous finding. No discrete lytic or  blastic osseous lesions. Mild to moderate cervical spondylolysis at C4-5 through C6-7. Prominent right-sided facet arthrosis noted at C3-4. 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 Soft tissues of the neck demonstrate no other acute finding. Upper chest: Layering fluid noted within the upper esophageal lumen. Visualized upper chest demonstrates no other acute finding. Partially visualized lungs are clear. Review of the MIP images confirms the above findings CTA HEAD FINDINGS Anterior circulation: Petrous ICAs widely patent bilaterally. Cavernous and supraclinoid segments widely patent without stenosis. 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 ICA termini well perfused distally. A1 segments patent bilaterally. Right A1 hypoplastic, likely accounting for the dominant left ICA as compared to the right. Normal anterior communicating artery. Anterior cerebral arteries widely patent to their distal aspects without stenosis. Right M1 widely patent without stenosis. Normal right MCA bifurcation. Distal right MCA branches well perfused. Left M1 patent proximally. 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 Upon review of prior MRI from 2010, this is likely chronic in nature. Stenosis measures approximately 3 mm in length. Left M1 patent distally. Normal left MCA bifurcation. Left MCA branches are opacified distally, although overall attenuated as compared to the right due to the severe M1 stenosis. Posterior circulation: Vertebral arteries patent to the vertebrobasilar junction without stenosis. Posterior inferior cerebral arteries patent bilaterally. Proximal and mid basilar artery diminutive but widely patent without stenosis. Persistent left-sided trigeminal artery supplies the distal basilar artery which is more robust in caliber. Superior cerebral arteries patent  bilaterally. Both of the posterior cerebral arteries primarily supplied via the basilar and are well perfused to their distal aspects. Venous sinuses: Grossly patent, although limited in assessment due to arterial timing the contrast bolus. Anatomic variants: Persistent left trigeminal artery. No intracranial aneurysm. Delayed phase: Not performed. Review of the MIP images confirms the above findings CT Brain Perfusion Findings: CBF (<30%) Volume: 42mL Perfusion (Tmax>6.0s) volume: 137mL Mismatch Volume: 123mL Infarction Location:No core infarct identified by CT perfusion. Large perfusion mismatch throughout the left MCA territory due to the severe left M1 stenosis. IMPRESSION: 1. Negative CTA for large vessel occlusion. No core infarct evident by CT perfusion. 2. Short-segment severe mid left M1 stenosis, likely similar as compared to previous MRI from 11/19/2008. Associated delayed perfusion throughout the left MCA territory distally. 3. No other hemodynamically significant or correctable stenosis within the major arterial vasculature of the head and neck. 4. Persistent left trigeminal artery. Finding discussed with Dr. Leonel Ramsay by telephone at approximately 9:45 p.m. on 08/30/2018. Electronically Signed   By: Jeannine Boga M.D.   On: 08/30/2018 22:59   Dg Chest 2 View  Result Date: 08/30/2018 CLINICAL DATA:  63 year old male with slurred speech and cough today. EXAM: CHEST - 2 VIEW COMPARISON:  08/27/2018 chest radiographs and earlier. FINDINGS: Lower lung volumes on both views. No pneumothorax, pulmonary edema, pleural effusion or consolidation. Mild basilar crowding of markings. Stable cardiac size at the upper limits of normal. Other mediastinal contours are  within normal limits. Visualized tracheal air column is within normal limits. No acute osseous abnormality identified. Negative visible bowel gas pattern. Previous ventral abdominal hernia repair with mesh. IMPRESSION: Lower lung volumes,  otherwise no acute cardiopulmonary abnormality. Electronically Signed   By: Genevie Ann M.D.   On: 08/30/2018 17:15   Ct Head Wo Contrast  Result Date: 09/01/2018 CLINICAL DATA:  Follow-up stroke.  Increased weakness on the right. EXAM: CT HEAD WITHOUT CONTRAST TECHNIQUE: Contiguous axial images were obtained from the base of the skull through the vertex without intravenous contrast. COMPARISON:  Brain MRI from 2 days ago FINDINGS: Brain: Acute infarct in the left deep watershed/lateral lenticulostriate distribution which has a more confluent low-density appearance than seen on prior diffusion imaging. No new territory involvement. No hemorrhagic conversion. Small remote right parietal cortex infarct. No hydrocephalus or masslike finding Vascular: Atherosclerotic calcification Skull: No acute finding Sinuses/Orbits: Right frontal maxillary and ethmoid sinusitis, also seen on prior study. IMPRESSION: More confluent acute infarct in the left basal ganglia and corona radiata when compared to diffusion imaging 2 days ago. No new distribution infarct or hemorrhagic conversion. Electronically Signed   By: Monte Fantasia M.D.   On: 09/01/2018 09:17   Ct Head Wo Contrast  Result Date: 08/30/2018 CLINICAL DATA:  63 year old male with abnormal speech onset today. EXAM: CT HEAD WITHOUT CONTRAST TECHNIQUE: Contiguous axial images were obtained from the base of the skull through the vertex without intravenous contrast. COMPARISON:  Brain MRI 11/19/2008. FINDINGS: Brain: A small area of right parietal lobe cortical encephalomalacia is redemonstrated on series 3, image 22. Cerebral volume has not significantly changed. New abnormal hypodensity in the left posterior limb internal capsule and/or lentiform (series 3, image 14). New hypodensity also in the left caudate and anterior lentiform on image 17. No acute intracranial hemorrhage identified. No midline shift, mass effect, or evidence of intracranial mass lesion. No  ventriculomegaly. No acute cortically based infarct identified. Incidental tentorial dural calcifications. Vascular: No suspicious intracranial vascular hyperdensity. Skull: Negative. Sinuses/Orbits: Right frontal ethmoid and maxillary sinus disease with fluid levels is new since 2010. The right sphenoid and left paranasal sinuses are spared. Tympanic cavities and mastoids are clear. Other: Mildly Disconjugate gaze, otherwise negative orbits. Visualized scalp soft tissues are within normal limits. IMPRESSION: 1. 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 2. Right [facility] pattern paranasal sinusitis. Electronically Signed   By: Genevie Ann M.D.   On: 08/30/2018 17:39   Ct Angio Neck W Or Wo Contrast  Result Date: 08/30/2018 CLINICAL DATA:  Initial evaluation for acute speech difficulty, right facial weakness. EXAM: CT ANGIOGRAPHY HEAD AND NECK CT PERFUSION BRAIN TECHNIQUE: Multidetector CT imaging of the head and neck was performed using the standard protocol during bolus administration of intravenous contrast. Multiplanar CT image reconstructions and MIPs were obtained to evaluate the vascular anatomy. Carotid stenosis measurements (when applicable) are obtained utilizing NASCET criteria, using the distal internal carotid diameter as the denominator. Multiphase CT imaging of the brain was performed following IV bolus contrast injection. Subsequent parametric perfusion maps were calculated using RAPID software. CONTRAST:  132mL ISOVUE-370 IOPAMIDOL (ISOVUE-370) INJECTION 76% COMPARISON:  Prior noncontrast CT from earlier same day. FINDINGS: CTA NECK FINDINGS Aortic arch: Visualized aortic arch of normal caliber with normal branch pattern. No hemodynamically significant stenosis about the origin of the great vessels. Visualized subclavian arteries widely patent. Right carotid system: Right common carotid artery patent from its origin to the bifurcation without stenosis. Minimal  mixed  plaque about the right bifurcation without stenosis. Short-segment mild stenosis of approximately 20% by NASCET criteria just distally within the proximal right ICA. Right ICA otherwise widely patent to the skull base without stenosis, dissection, or occlusion. Left carotid system: Left common carotid artery patent from its origin to the bifurcation without stenosis. No significant atheromatous narrowing about the left bifurcation. Left ICA widely patent from the bifurcation to the skull base without stenosis, dissection, or occlusion. Vertebral arteries: Both of the vertebral arteries arise from the subclavian arteries. Vertebral arteries widely patent within the neck without stenosis, dissection, or occlusion. Skeleton: No acute osseous finding. No discrete lytic or blastic osseous lesions. Mild to moderate cervical spondylolysis at C4-5 through C6-7. Prominent right-sided facet arthrosis noted at C3-4. 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 Soft tissues of the neck demonstrate no other acute finding. Upper chest: Layering fluid noted within the upper esophageal lumen. Visualized upper chest demonstrates no other acute finding. Partially visualized lungs are clear. Review of the MIP images confirms the above findings CTA HEAD FINDINGS Anterior circulation: Petrous ICAs widely patent bilaterally. Cavernous and supraclinoid segments widely patent without stenosis. 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 ICA termini well perfused distally. A1 segments patent bilaterally. Right A1 hypoplastic, likely accounting for the dominant left ICA as compared to the right. Normal anterior communicating artery. Anterior cerebral arteries widely patent to their distal aspects without stenosis. Right M1 widely patent without stenosis. Normal right MCA bifurcation. Distal right MCA branches well perfused. Left M1 patent proximally. Focal loss of contrast opacification involving the  mid left M1 segment, most consistent with a severe near occlusive stenosis (series 7, image 111). Upon review of prior MRI from 2010, this is likely chronic in nature. Stenosis measures approximately 3 mm in length. Left M1 patent distally. Normal left MCA bifurcation. Left MCA branches are opacified distally, although overall attenuated as compared to the right due to the severe M1 stenosis. Posterior circulation: Vertebral arteries patent to the vertebrobasilar junction without stenosis. Posterior inferior cerebral arteries patent bilaterally. Proximal and mid basilar artery diminutive but widely patent without stenosis. Persistent left-sided trigeminal artery supplies the distal basilar artery which is more robust in caliber. Superior cerebral arteries patent bilaterally. Both of the posterior cerebral arteries primarily supplied via the basilar and are well perfused to their distal aspects. Venous sinuses: Grossly patent, although limited in assessment due to arterial timing the contrast bolus. Anatomic variants: Persistent left trigeminal artery. No intracranial aneurysm. Delayed phase: Not performed. Review of the MIP images confirms the above findings CT Brain Perfusion Findings: CBF (<30%) Volume: 44mL Perfusion (Tmax>6.0s) volume: 110mL Mismatch Volume: 122mL Infarction Location:No core infarct identified by CT perfusion. Large perfusion mismatch throughout the left MCA territory due to the severe left M1 stenosis. IMPRESSION: 1. Negative CTA for large vessel occlusion. No core infarct evident by CT perfusion. 2. Short-segment severe mid left M1 stenosis, likely similar as compared to previous MRI from 11/19/2008. Associated delayed perfusion throughout the left MCA territory distally. 3. No other hemodynamically significant or correctable stenosis within the major arterial vasculature of the head and neck. 4. Persistent left trigeminal artery. Finding discussed with Dr. Leonel Ramsay by telephone at  approximately 9:45 p.m. on 08/30/2018. Electronically Signed   By: Jeannine Boga M.D.   On: 08/30/2018 22:59   Mr Brain Wo Contrast  Result Date: 08/30/2018 CLINICAL DATA:  63 y/o  M; right-sided weakness. EXAM: MRI HEAD WITHOUT CONTRAST TECHNIQUE: Multiplanar, multiecho  pulse sequences of the brain and surrounding structures were obtained without intravenous contrast. COMPARISON:  08/30/2018 CT head, CT perfusion head, and CTA head. FINDINGS: Brain: Multiple foci of reduced diffusion are present within the left caudate body, left mid and posterior corona radiata, left posterior limb of internal capsule extending into left putamen, as well as several additional small foci of the temporoparietal periventricular white matter and the left frontal operculum compatible with acute/early subacute infarction. No associated hemorrhage or mass effect. Infarcts are T2 FLAIR hyperintense. No acute hemorrhage. No extra-axial collection, hydrocephalus, mass effect, or herniation. Small chronic infarcts in the right parietal cortex and right occipital periventricular white matter. Mild chronic microvascular ischemic changes of the brain. Mild brain parenchymal volume loss. Vascular: Please refer to CT angiogram of the head and neck. Skull and upper cervical spine: Normal marrow signal. Sinuses/Orbits: Right frontal, ethmoid, and maxillary sinus partial opacification and fluid level. No abnormal signal of the mastoid air cells. Orbits are unremarkable. Other: None. IMPRESSION: 1. Multiple small foci of acute/early subacute infarction are present in the left MCA distribution concentrated in left basal ganglia inclusive of the corona radiata and posterior limb of internal capsule. No hemorrhage or mass effect. 2. Mild chronic microvascular ischemic changes and volume loss of the brain. Small chronic infarcts in right parietal and occipital lobes. 3. Right frontal, ethmoid, and maxillary sinus disease is a right middle  meatus obstructive pattern, direct visualization recommended. These results were called by telephone at the time of interpretation on 08/30/2018 at 10:46 pm to Dr. Leonel Ramsay, who verbally acknowledged these results. Electronically Signed   By: Kristine Garbe M.D.   On: 08/30/2018 22:48   Ct Cerebral Perfusion W Contrast  Result Date: 08/30/2018 CLINICAL DATA:  Initial evaluation for acute speech difficulty, right facial weakness. EXAM: CT ANGIOGRAPHY HEAD AND NECK CT PERFUSION BRAIN TECHNIQUE: Multidetector CT imaging of the head and neck was performed using the standard protocol during bolus administration of intravenous contrast. Multiplanar CT image reconstructions and MIPs were obtained to evaluate the vascular anatomy. Carotid stenosis measurements (when applicable) are obtained utilizing NASCET criteria, using the distal internal carotid diameter as the denominator. Multiphase CT imaging of the brain was performed following IV bolus contrast injection. Subsequent parametric perfusion maps were calculated using RAPID software. CONTRAST:  117mL ISOVUE-370 IOPAMIDOL (ISOVUE-370) INJECTION 76% COMPARISON:  Prior noncontrast CT from earlier same day. FINDINGS: CTA NECK FINDINGS Aortic arch: Visualized aortic arch of normal caliber with normal branch pattern. No hemodynamically significant stenosis about the origin of the great vessels. Visualized subclavian arteries widely patent. Right carotid system: Right common carotid artery patent from its origin to the bifurcation without stenosis. Minimal mixed plaque about the right bifurcation without stenosis. Short-segment mild stenosis of approximately 20% by NASCET criteria just distally within the proximal right ICA. Right ICA otherwise widely patent to the skull base without stenosis, dissection, or occlusion. Left carotid system: Left common carotid artery patent from its origin to the bifurcation without stenosis. No significant atheromatous narrowing  about the left bifurcation. Left ICA widely patent from the bifurcation to the skull base without stenosis, dissection, or occlusion. Vertebral arteries: Both of the vertebral arteries arise from the subclavian arteries. Vertebral arteries widely patent within the neck without stenosis, dissection, or occlusion. Skeleton: No acute osseous finding. No discrete lytic or blastic osseous lesions. Mild to moderate cervical spondylolysis at C4-5 through C6-7. Prominent right-sided facet arthrosis noted at C3-4. 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 Soft tissues of  the neck demonstrate no other acute finding. Upper chest: Layering fluid noted within the upper esophageal lumen. Visualized upper chest demonstrates no other acute finding. Partially visualized lungs are clear. Review of the MIP images confirms the above findings CTA HEAD FINDINGS Anterior circulation: Petrous ICAs widely patent bilaterally. Cavernous and supraclinoid segments widely patent without stenosis. 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 ICA termini well perfused distally. A1 segments patent bilaterally. Right A1 hypoplastic, likely accounting for the dominant left ICA as compared to the right. Normal anterior communicating artery. Anterior cerebral arteries widely patent to their distal aspects without stenosis. Right M1 widely patent without stenosis. Normal right MCA bifurcation. Distal right MCA branches well perfused. Left M1 patent proximally. 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 Upon review of prior MRI from 2010, this is likely chronic in nature. Stenosis measures approximately 3 mm in length. Left M1 patent distally. Normal left MCA bifurcation. Left MCA branches are opacified distally, although overall attenuated as compared to the right due to the severe M1 stenosis. Posterior  circulation: Vertebral arteries patent to the vertebrobasilar junction without stenosis. Posterior inferior cerebral arteries patent bilaterally. Proximal and mid basilar artery diminutive but widely patent without stenosis. Persistent left-sided trigeminal artery supplies the distal basilar artery which is more robust in caliber. Superior cerebral arteries patent bilaterally. Both of the posterior cerebral arteries primarily supplied via the basilar and are well perfused to their distal aspects. Venous sinuses: Grossly patent, although limited in assessment due to arterial timing the contrast bolus. Anatomic variants: Persistent left trigeminal artery. No intracranial aneurysm. Delayed phase: Not performed. Review of the MIP images confirms the above findings CT Brain Perfusion Findings: CBF (<30%) Volume: 16mL Perfusion (Tmax>6.0s) volume: 146mL Mismatch Volume: 158mL Infarction Location:No core infarct identified by CT perfusion. Large perfusion mismatch throughout the left MCA territory due to the severe left M1 stenosis. IMPRESSION: 1. Negative CTA for large vessel occlusion. No core infarct evident by CT perfusion. 2. Short-segment severe mid left M1 stenosis, likely similar as compared to previous MRI from 11/19/2008. Associated delayed perfusion throughout the left MCA territory distally. 3. No other hemodynamically significant or correctable stenosis within the major arterial vasculature of the head and neck. 4. Persistent left trigeminal artery. Finding discussed with Dr. Leonel Ramsay by telephone at approximately 9:45 p.m. on 08/30/2018. Electronically Signed   By: Jeannine Boga M.D.   On: 08/30/2018 22:59     Transthoracic Echocardiogram  Normal ejection fraction 55-60%. No left ventricular hypertrophy or clot.  TEE pending  TCD Bubble study negative LE venous Dopplers pending   PHYSICAL EXAM Blood pressure 112/75, pulse 61, temperature 98.8 F (37.1 C), temperature source Oral, resp.  rate 20, height 5\' 5"  (1.651 m), weight 79.3 kg, SpO2 98 %. Constitutional: Appears well-developed and well-nourished. Appears younger than stated age. Psych: Affect appropriate to situation; depressed Eyes: No scleral injection HENT: No OP obstrucion Head: Normocephalic.  Cardiovascular: Normal rate and regular rhythm.  Respiratory: Effort normal, non-labored breathing GI: Soft.  No distension. There is no tenderness.  Skin: WDI  Neuro: Mental Status: Patient is awake, alert, severe expressive and mild receptive aphasia with  Dysarthria,and can barely speak a few words  Follows only simple midline and one-step commands.   Mild right hemi-neglect Cranial Nerves: II: There is a right visual field cut and slight neglect. Pupils are equal, round, and reactive to light.  III,IV, VI: EOMI without ptosis or diploplia.  V: Facial sensation is symmetric to temperature VII: Facial movement with right facial droop VIII: hearing is intact to voice X: Uvula elevates symmetrically XI: Shoulder shrug is symmetric. XII: tongue is midline without atrophy or fasciculations.  Motor: He has dense right hemiplegia with 1/5 strength. Normal sensation on the left. Sensory: There is neglect to bilateral sensation in right arm/right leg, but not face. Cerebellar: Finger-nose-finger intact on the left, consistent with weakness on the right  Baseline MRS 0 NIHSS  7  HOME MEDICATIONS:  Medications Prior to Admission  Medication Sig Dispense Refill  . alfuzosin (UROXATRAL) 10 MG 24 hr tablet Take 10 mg by mouth 3 (three) times a week.    Marland Kitchen aspirin 81 MG tablet Take 81 mg by mouth daily.    Marland Kitchen MILK THISTLE PO Take 2 tablets by mouth daily.    . Misc Natural Products (SAW PALMETTO) CAPS Take 2-4 capsules by mouth daily.    . multivitamin (ONE-A-DAY MEN'S) TABS tablet Take 1 tablet by mouth daily.    . sildenafil (REVATIO) 20 MG tablet 2-5 pills as needed for ED symptoms (Patient not taking: Reported on  08/30/2018) 50 tablet 5      HOSPITAL MEDICATIONS:  . alfuzosin  10 mg Oral Once per day on Mon Wed Fri  . aspirin EC  81 mg Oral Daily  . atorvastatin  40 mg Oral q1800  . clopidogrel  75 mg Oral Daily  . enoxaparin (LOVENOX) injection  40 mg Subcutaneous Q24H  . FLUoxetine  20 mg Oral Daily  . multivitamin with minerals  1 tablet Oral Daily    ALLERGIES No Known Allergies  PAST MEDICAL HISTORY Past Medical History:  Diagnosis Date  . Atypical chest pain 08/10/2013  . Elevated aspartate aminotransferase level 06/16/2015  . Excessive salivation 06/16/2015  . H/O nutritional disorder 06/16/2015  . 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 Screening for prostate cancer 08/21/2014  . 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 HERNIA REPAIR  2006  . left tricep surgery      FAMILY HISTORY Family History  Problem Relation Age of Onset  . Kidney failure Mother   . Cancer Mother   . Prostate cancer Father   . Cancer Father   . Diabetes Father   . Colon cancer Neg Hx   . Esophageal cancer Neg Hx   . Rectal cancer Neg Hx   . Stomach cancer Neg Hx     SOCIAL HISTORY  reports that he has never smoked. He has never used smokeless tobacco. He reports that he does not drink alcohol or use drugs.  ASSESSMENT/PLAN 63 year old male who is very fit/healthy at baseline. MRI shows new scattered left acute ischemic strokes. CTA shows L M1 near occulsion. He arrived greater than 24h from time since last well, therefore no acute stroke interventions done. 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 Stroke wk up underway. Pt and SO deny any palpations. When pts dtrs arrive, they both explain that in 2016 after tricep repair surgery they noted an possible arrhythmia and placed him on holter monitor for 30d, but was unrevealing.     Stroke: left MCA patchy secondary to left M1 occlusion due to embolism with partial reconstitution. Etiology cryptogenic.patient has had neurological worsening. Extension of deficits on 09/01/18  Resultant right side weakness, dysarthria  and mixed aphasia  CT head patchy areas in the left hemisphere MRI head -  MRA head - scattered L MCA infarcts, mostly concentrated in L BG  CTA H&N- severe stenosis versus occlusion of the left M1; will discuss further with radiology for possible stenting option  2D Echo - normal ejection fraction.LDL - 104  HgbA1c - 6.2  UDS - neg  VTE prophylaxis - lovenox  Diet heart healthy, cleared by SLP   prior to admission was taking 81mg  ASA, now on DAPT after Plavix load of 300mg   Patient counseled to be compliant with his antithrombotic medications  Ongoing aggressive stroke risk factor management  Therapy recommendations:  pending  Disposition:  Pending  Hypertension  Stable . Permissive hypertension (OK if < 220/120) but gradually normalize in 5-7 days . 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 will start Prozac today as this has been shown to also improve motor deficits in stroke pts.    Hospital day # 1   I have personally obtained history,examined this patient, reviewed notes, independently viewed imaging studies, participated in medical decision making and plan of care.ROS completed by me personally and pertinent positives fully documented  I have made any additions or clarifications directly to the above note. Agree with note above.  He presented with sudden onset of aphasia and right sided weakness  secondary to embolic left MCA infarct etiology to be determined. Continue ongoing stroke workup and dual antiplatelet therapy and aggressive risk factor modification. Plan keep bedrest today. IV normal saline 1 L bolus followed by 100 mL an hour. Long discussion with patient, daughters and other family members at the bedside and answered questions about his neurological worsening and stroke evaluation and treatment plan Check TEE and  and loop recorder.and lower extremity venous Dopplers.he'll likely need transfer to inpatient rehabilitation early next week. Patient also appears to be at risk for sleep apnea given history of snoring and witnessed sleep apnea by his wife. He is interested in participating in the sleep smart stroke study and was given information to review and decide. Greater than 50% time during this 35 minute visit was spent on counseling and coordination of care about his embolic stroke discussion about evaluation, treatment and answering questions. Discussed with patient, his 2 daughters and Dr. Aline Brochure, MD Medical Director [place] Pager: [phone] 09/01/2018 2:09 PM  To contact Stroke Continuity provider, please refer to [email or web address] After hours, contact General Neurology

## 2018-09-02 ENCOUNTER — Inpatient Hospital Stay (HOSPITAL_COMMUNITY): Payer: BLUE CROSS/BLUE SHIELD

## 2018-09-02 DIAGNOSIS — I63512 Cerebral infarction due to unspecified occlusion or stenosis of left middle cerebral artery: Secondary | ICD-10-CM

## 2018-09-02 NOTE — Progress Notes (Signed)
Modified Barium Swallow Progress Note  Patient Details  Name: EDINSON DOMEIER MRN: [id number] Date of Birth: [birth year]  Today's Date: 09/02/2018  Modified Barium Swallow completed.  Full report located under Chart Review in the Imaging Section.  Brief recommendations include the following:  Clinical Impression  Pt has a mild-moderate oropharyngeal dysphagia further impacted by cognitive-lingusitic skills. He has reduced oral containment, especially with liquids, and incomplete mastication of solids before they spill into the pharynx. Given reduced bolus cohesion, base of tongue retraction, intermittent delay in trigger, and reduced anterior excursion, thin liquids are penetrated and at one point aspirated before the swallow with a weak throat clear elicited. A strong, immediate cough was noted as pt was masticating a solid bolus, but fluoro had transiently been turned off and therefore episode was not captured; however, this episode was concerning for spillage into the airway given no other coughing during study. Residue remained in the valleculae and pyriform sinuses across consistencies, which he could reduce when cued to swallow again. Recommend continuing Dys 2 diet and nectar thick liquids. May wish to consider use of straw due to mildly improved swallow trigger observed during study. Will continue to follow for tolerance and readiness to advance.   Swallow Evaluation Recommendations       SLP Diet Recommendations: Dysphagia 2 (Fine chop) solids;Nectar thick liquid   Liquid Administration via: Straw   Medication Administration: Crushed with puree   Supervision: Patient able to self feed;Full supervision/cueing for compensatory strategies   Compensations: Slow rate;Small sips/bites;Multiple dry swallows after each bite/sip   Postural Changes: Seated upright at 90 degrees   Oral Care Recommendations: Oral care BID   Other Recommendations: Prohibited food (jello, ice cream, thin  soups);Remove water pitcher    Talbert Nan 09/02/2018,12:41 PM   Nuala Alpha, M.A. [place] Acute Environmental education officer [phone] Office [phone]

## 2018-09-02 NOTE — Progress Notes (Signed)
Physical Therapy Treatment Patient Details Name: Edward Glass MRN: [id number] DOB: 1956-03-08 Today's Date: 09/02/2018    History of Present Illness Patient is a 63 y/o male presenting to the ED on 08/30/2018 with primary complaints of aphasia, R facila droop and R sided weakness. CT revealing Age indeterminate small vessel ischemia in the left basal ganglia. 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 PMH significant for hypogonadism, possible BPH. Of note, pt with worsening symptoms on the morning of 2/7. CT scan of the head was obtained which shows slight extension of left MCA infarct but no hemorrhage or new unexpected finding.    PT Comments    Pt seen for re-evaluation after extension of L MCA infarct on 2/7. Pt with no active movement of R UE or R LE. Pt endorses normal sensation throughout R LE as compared to L; however, with decreased sensation to light touch on R UE throughout. Pt currently requires min A for bed mobility and min-mod A x2 for transfers. Focus of session was on standing balance and sit<>stands. Pt able to progress from requiring mod A x2 to min A with sit<>stand within session. Pt's girlfriend present and supportive throughout. Pt would greatly benefit from further intensive therapy services at [facility] to maximize his independence with functional mobility prior to returning home with family support. PT will continue to follow acutely to progress mobility as tolerated.   BP supine: 157/64 mmHg BP sitting initially: 157/90 mmHg BP sitting at end of session: 146/83 mmHg   Follow Up Recommendations  CIR     Equipment Recommendations  None recommended by PT    Recommendations for Other Services       Precautions / Restrictions Precautions Precautions: Fall Precaution Comments: R  hemiplegia Restrictions Weight Bearing Restrictions: No    Mobility  Bed Mobility Overal bed mobility: Needs Assistance Bed Mobility: Supine to Sit     Supine to sit: Min assist     General bed mobility comments: increased time and effort by patient. Achieved upright sitting at EOB towards pt's L side; min A for R LE movement off of bed  Transfers Overall transfer level: Needs assistance Equipment used: 2 person hand held assist Transfers: Sit to/from [facility] Sit to Stand: Mod assist;+2 physical assistance;Min assist Stand pivot transfers: Mod assist;+2 physical assistance       General transfer comment: pt progressing from mod A x2 to min A with sit<>stands; therapist blocking R knee throughout; pt with very poor eccentric control to return to sitting  Ambulation/Gait             General Gait Details: pt only able to take a few pivotal steps towards his L side with mod-max A x2   Stairs             Wheelchair Mobility    Modified Rankin (Stroke Patients Only) Modified Rankin (Stroke Patients Only) Pre-Morbid Rankin Score: No symptoms Modified Rankin: Moderately severe disability     Balance Overall balance assessment: Needs assistance Sitting-balance support: Feet supported Sitting balance-Leahy Scale: Fair Sitting balance - Comments: pt able to maintain upright midline sitting at EOB with supervision   Standing balance support: Single extremity supported;During functional activity Standing balance-Leahy Scale: Poor Standing balance comment: min-mod A x2; worked on lateral weight shifting with therapist facilitating improved knee alignment on R side as  pt with frequent genu recurvatum                            Cognition Arousal/Alertness: Awake/alert Behavior During Therapy: Flat affect Overall Cognitive Status: Difficult to assess                                 General Comments: difficult to assess  due to aphasia; pt able to follow simple one-step commands but required frequent verbal and tactile cueing      Exercises Other Exercises Other Exercises: pt performed 5x sit<>stand from recliner chair, progressing from requiring mod A x2 to min A with verbal and tactile cueing, therapist blocking R knee    General Comments        Pertinent Vitals/Pain Pain Assessment: No/denies pain(Simultaneous filing. User may not have seen previous data.) Faces Pain Scale: (P) No hurt    Home Living                      Prior Function            PT Goals (current goals can now be found in the care plan section) Acute Rehab PT Goals PT Goal Formulation: With patient Time For Goal Achievement: 09/14/18 Potential to Achieve Goals: Good Progress towards PT goals: Progressing toward goals    Frequency    Min 4X/week      PT Plan Current plan remains appropriate    Co-evaluation              AM-PAC PT "6 Clicks" Mobility   Outcome Measure  Help needed turning from your back to your side while in a flat bed without using bedrails?: A Little Help needed moving from lying on your back to sitting on the side of a flat bed without using bedrails?: A Little Help needed moving to and from a bed to a chair (including a wheelchair)?: A Lot Help needed standing up from a chair using your arms (e.g., wheelchair or bedside chair)?: A Lot Help needed to walk in hospital room?: A Lot Help needed climbing 3-5 steps with a railing? : Total 6 Click Score: 13    End of Session Equipment Utilized During Treatment: Gait belt Activity Tolerance: Patient tolerated treatment well Patient left: in chair;with call bell/phone within reach;with family/visitor present Nurse Communication: Mobility status PT Visit Diagnosis: Unsteadiness on feet (R26.81);Other abnormalities of gait and mobility (R26.89);Muscle weakness (generalized) (M62.81)     Time: 7412-8786 PT Time Calculation (min)  (ACUTE ONLY): 33 min  Charges:  $Therapeutic Activity: 8-22 mins                     Sherie Don, PT, DPT  Acute Rehabilitation Services Pager [phone] Office [place] 09/02/2018, 12:00 PM

## 2018-09-02 NOTE — Progress Notes (Signed)
P.t. wants to permanently refuse 200 mg Prozac.

## 2018-09-02 NOTE — Progress Notes (Signed)
PROGRESS NOTE    Edward Glass   NOB:[id number]  DOB: 06/01/1956  DOA: 08/30/2018 PCP: Lurline Hare, MD   Brief Narrative:  Edward Glass is a 63 y.o. male with medical history significant of hypogonadism, possible BPH, who presents with   difficulty speaking, right facial droop and right arm and leg weakness.   He was not a candidate for TPA. Given Aspirin and a Plavix load.   Subjective: No complaints today.     Assessment & Plan:   Principal Problem:   CVA (cerebral vascular accident)- right visual field cut, right facial droop, right arm/ leg weaknes, dysarthria, mild receptive aphasia - CTA head/neck> Short-segment severe mid left M1 stenosis, likely similar as compared to previous MRI from 11/19/2008. - MRI> Multiple small foci of acute/early subacute infarction are present in the left MCA distribution concentrated in left basal ganglia inclusive of the corona radiata and posterior limb of internal capsule. - LDL 104- Lipitor added - A1c 6.2- discuss diet control and repeat A1c in about 1 month- will ask for dietician consult  - PT eval completed- recommended CIR - I have consulted them - ECHO shows no thrombus - Neuro suspecting embolic infarct -2/7, symptoms noted to have progressed to where he can no longer move right arm or leg - CT head> More confluent acute infarct in the left basal ganglia and corona radiata - TEE and loop recorder pending - per MBS and SLP eval, he needs mechanical soft with nectar thick liquids at this point due to oropharyngeal dysphagia- meds crushed with pureed  Active Problems: BPH - Alfuzosin continued  Hypogonadism   Time spent in minutes: 35 DVT prophylaxis: Lovenox Code Status: Full code Family Communication: daughters, brother Disposition Plan: to be determined Consultants:   Neuro Procedures:   ECHO 1. The left ventricle has normal systolic function of 94-76%. The cavity size was normal. There is no increased  left ventricular wall thickness. Echo evidence of impaired diastolic relaxation.  2. The right ventricle has normal systolic function. The cavity was normal. There is no increase in right ventricular wall thickness.  3. The mitral valve is normal in structure. No evidence of mitral valve stenosis. No significant mitral regurgitation.  4. The tricuspid valve is normal in structure.  5. The aortic valve is tricuspid There is mild calcification of the aortic valve. No aortic stenosis.  6. The pulmonic valve was normal in structure.  7. The aortic root and ascending aorta are normal in size and structure.  8. No evidence of left ventricular regional wall motion abnormalities.  9. The inferior vena cava is normal in size with greater than 50% respiratory variability. 10. No complete TR doppler jet so unable to estimate PA systolic pressure.  Antimicrobials:  Anti-infectives (From admission, onward)   None       Objective: Vitals:   09/02/18 0348 09/02/18 0755 09/02/18 1258 09/02/18 1622  BP: 140/75 (!) 149/79 133/75 139/80  Pulse: (!) 53 (!) 56 64 67  Resp: 18 16 16 18   Temp: 98.5 F (36.9 C) 98.5 F (36.9 C) 98.6 F (37 C) 98.1 F (36.7 C)  TempSrc: Oral Oral Oral Oral  SpO2: 98% 94% 95% 97%  Weight:      Height:        Intake/Output Summary (Last 24 hours) at 09/02/2018 1711 Last data filed at 09/02/2018 0649 Gross per 24 hour  Intake 957.05 ml  Output 750 ml  Net 207.05 ml   [employer]  08/30/18 1546  Weight: 79.3 kg    Examination: General exam: Appears comfortable  HEENT: PERRLA, oral mucosa moist, no sclera icterus or thrush Respiratory system: Clear to auscultation. Respiratory effort normal. Cardiovascular system: S1 & S2 heard,  No murmurs  Gastrointestinal system: Abdomen soft, non-tender, nondistended. Normal bowel sound. No organomegaly Central nervous system: Alert and oriented. 0/5 strength in right arm and leg Extremities: No cyanosis, clubbing or  edema Skin: No rashes or ulcers Psychiatry:  Mood & affect appropriate.     Data Reviewed: I have personally reviewed following labs and imaging studies  CBC: Recent Labs  Lab 08/27/18 1132 08/30/18 1546  WBC 5.4 6.5  NEUTROABS 3.4 3.8  HGB 15.2 15.4  HCT 46.8 48.5  MCV 90.7 91.9  PLT 260 626   Basic Metabolic Panel: Recent Labs  Lab 08/27/18 1255 08/27/18 1412 08/30/18 1546  NA 136 137 140  K 6.3* 4.0 3.7  CL 107 107 105  CO2 23 23 23   GLUCOSE 103* 102* 100*  BUN 23 23 17   CREATININE 0.81 0.78 0.91  CALCIUM 8.6* 9.0 9.4   GFR: Estimated Creatinine Clearance: 80.6 mL/min (by C-G formula based on SCr of 0.91 mg/dL). Liver Function Tests: Recent Labs  Lab 08/30/18 1546  AST 40  ALT 35  ALKPHOS 65  BILITOT 0.4  PROT 7.1  ALBUMIN 3.8   No results for input(s): LIPASE, AMYLASE in the last 168 hours. No results for input(s): AMMONIA in the last 168 hours. Coagulation Profile: No results for input(s): INR, PROTIME in the last 168 hours. Cardiac Enzymes: No results for input(s): CKTOTAL, CKMB, CKMBINDEX, TROPONINI in the last 168 hours. BNP (last 3 results) No results for input(s): PROBNP in the last 8760 hours. HbA1C: Recent Labs    08/31/18 0447  HGBA1C 6.2*   CBG: No results for input(s): GLUCAP in the last 168 hours. Lipid Profile: Recent Labs    08/31/18 0447  CHOL 173  HDL 52  LDLCALC 104*  TRIG 86  CHOLHDL 3.3   Thyroid Function Tests: No results for input(s): TSH, T4TOTAL, FREET4, T3FREE, THYROIDAB in the last 72 hours. Anemia Panel: No results for input(s): VITAMINB12, FOLATE, FERRITIN, TIBC, IRON, RETICCTPCT in the last 72 hours. Urine analysis:    Component Value Date/Time   COLORURINE STRAW (A) 08/30/2018 1801   APPEARANCEUR CLEAR 08/30/2018 1801   LABSPEC 1.014 08/30/2018 1801   PHURINE 5.0 08/30/2018 1801   GLUCOSEU NEGATIVE 08/30/2018 1801   HGBUR SMALL (A) 08/30/2018 1801   BILIRUBINUR NEGATIVE 08/30/2018 1801    BILIRUBINUR neg 10/03/2013 1529   KETONESUR NEGATIVE 08/30/2018 1801   PROTEINUR NEGATIVE 08/30/2018 1801   UROBILINOGEN 0.2 10/03/2013 1529   NITRITE NEGATIVE 08/30/2018 1801   LEUKOCYTESUR NEGATIVE 08/30/2018 1801   Sepsis Labs: @LABRCNTIP (procalcitonin:4,lacticidven:4) )No results found for this or any previous visit (from the past 240 hour(s)).       Radiology Studies: Ct Head Wo Contrast  Result Date: 09/01/2018 CLINICAL DATA:  Follow-up stroke.  Increased weakness on the right. EXAM: CT HEAD WITHOUT CONTRAST TECHNIQUE: Contiguous axial images were obtained from the base of the skull through the vertex without intravenous contrast. COMPARISON:  Brain MRI from 2 days ago FINDINGS: Brain: Acute infarct in the left deep watershed/lateral lenticulostriate distribution which has a more confluent low-density appearance than seen on prior diffusion imaging. No new territory involvement. No hemorrhagic conversion. Small remote right parietal cortex infarct. No hydrocephalus or masslike finding Vascular: Atherosclerotic calcification Skull: No acute finding Sinuses/Orbits: Right frontal  maxillary and ethmoid sinusitis, also seen on prior study. IMPRESSION: More confluent acute infarct in the left basal ganglia and corona radiata when compared to diffusion imaging 2 days ago. No new distribution infarct or hemorrhagic conversion. Electronically Signed   By: Monte Fantasia M.D.   On: 09/01/2018 09:17   Vas [place] Transcranial Doppler W Bubbles  Result Date: 09/01/2018  Transcranial Doppler with Bubble Indications: Stroke. Performing Technologist: Maudry Mayhew MHA, RDMS, RVT, RDCS  Examination Guidelines: A complete evaluation includes B-mode imaging, spectral Doppler, color Doppler, and power Doppler as needed of all accessible portions of each vessel. Bilateral testing is considered an integral part of a complete examination. Limited examinations for reoccurring indications may be performed as  noted.  Summary:  A vascular evaluation was performed. The right middle cerebral artery was studied. An IV was inserted into the patient's left forearm. Verbal informed consent was obtained.  There is no evidence of HITS (high intensity transient signals) at rest or with Valsalva. Therefore, there is no evidence of PFO (patent foramen ovale). *See table(s) above for measurements and observations.    Preliminary    Vas [place] Lower Extremity Venous (dvt)  Result Date: 09/01/2018  Lower Venous Study Indications: Stroke.  Performing Technologist: Maudry Mayhew MHA, RDMS, RVT, RDCS  Examination Guidelines: A complete evaluation includes B-mode imaging, spectral Doppler, color Doppler, and power Doppler as needed of all accessible portions of each vessel. Bilateral testing is considered an integral part of a complete examination. Limited examinations for reoccurring indications may be performed as noted.  Right Venous Findings: +---------+---------------+---------+-----------+----------+--------------+          CompressibilityPhasicitySpontaneityPropertiesSummary        +---------+---------------+---------+-----------+----------+--------------+ CFV      Full           Yes      Yes                                 +---------+---------------+---------+-----------+----------+--------------+ SFJ      Full                                                        +---------+---------------+---------+-----------+----------+--------------+ FV Prox  Full                                                        +---------+---------------+---------+-----------+----------+--------------+ FV Mid   Full                                                        +---------+---------------+---------+-----------+----------+--------------+ FV DistalFull                                                        +---------+---------------+---------+-----------+----------+--------------+ PFV      Full                                                         +---------+---------------+---------+-----------+----------+--------------+  POP      Full                    Yes                  Rouleaux flow  +---------+---------------+---------+-----------+----------+--------------+ PTV      Full                                                        +---------+---------------+---------+-----------+----------+--------------+ PERO                                                  Not visualized +---------+---------------+---------+-----------+----------+--------------+  Left Venous Findings: +---------+---------------+---------+-----------+----------+-------+          CompressibilityPhasicitySpontaneityPropertiesSummary +---------+---------------+---------+-----------+----------+-------+ CFV      Full           No       Yes                          +---------+---------------+---------+-----------+----------+-------+ SFJ      Full                                                 +---------+---------------+---------+-----------+----------+-------+ FV Prox  Full                                                 +---------+---------------+---------+-----------+----------+-------+ FV Mid   Full                                                 +---------+---------------+---------+-----------+----------+-------+ FV DistalFull                                                 +---------+---------------+---------+-----------+----------+-------+ PFV      Full                                                 +---------+---------------+---------+-----------+----------+-------+ POP      Full                    Yes                          +---------+---------------+---------+-----------+----------+-------+ PTV      Full                                                 +---------+---------------+---------+-----------+----------+-------+  PERO     Full                                                  +---------+---------------+---------+-----------+----------+-------+    Summary: Right: There is no evidence of deep vein thrombosis in the lower extremity. However, portions of this examination were limited- see technologist comments above. No cystic structure found in the popliteal fossa. Left: There is no evidence of deep vein thrombosis in the lower extremity. However, portions of this examination were limited- see technologist comments above. No cystic structure found in the popliteal fossa.  Pulsatile lower extremity venous flow is suggestive of possible elevated right-sided heart pressure. *See table(s) above for measurements and observations. Electronically signed by Monica Martinez MD on 09/01/2018 at 3:44:34 PM.    Final       Scheduled Meds: . alfuzosin  10 mg Oral Once per day on Mon Wed Fri  . aspirin EC  81 mg Oral Daily  . atorvastatin  40 mg Oral q1800  . clopidogrel  75 mg Oral Daily  . enoxaparin (LOVENOX) injection  40 mg Subcutaneous Q24H  . FLUoxetine  20 mg Oral Daily  . multivitamin with minerals  1 tablet Oral Daily   Continuous Infusions:    LOS: 2 days      Debbe Odea, MD [facility] Pager: [email or web address] Password [email or web address] 09/02/2018, 5:11 PM

## 2018-09-02 NOTE — Progress Notes (Signed)
STROKE TEAM PROGRESS NOTE   SUBJECTIVE (INTERVAL HISTORY) His daughter is at the bedside.  The patient still has right facial droop and right hemiplegia. Partial expressive aphasia. Pending TEE and loop.   OBJECTIVE Vitals:   09/01/18 2025 09/01/18 2322 09/02/18 0348 09/02/18 0755  BP: (!) 147/77 117/66 140/75 (!) 149/79  Pulse: 63 (!) 58 (!) 53 (!) 56  Resp: 18 18 18 16   Temp: 98.3 F (36.8 C) 99.5 F (37.5 C) 98.5 F (36.9 C) 98.5 F (36.9 C)  TempSrc: Oral Oral Oral Oral  SpO2: 100% 95% 98% 94%  Weight:      Height:        CBC:  Recent Labs  Lab 08/27/18 1132 08/30/18 1546  WBC 5.4 6.5  NEUTROABS 3.4 3.8  HGB 15.2 15.4  HCT 46.8 48.5  MCV 90.7 91.9  PLT 260 628    Basic Metabolic Panel:  Recent Labs  Lab 08/27/18 1412 08/30/18 1546  NA 137 140  K 4.0 3.7  CL 107 105  CO2 23 23  GLUCOSE 102* 100*  BUN 23 17  CREATININE 0.78 0.91  CALCIUM 9.0 9.4    Lipid Panel:     Component Value Date/Time   CHOL 173 08/31/2018 0447   CHOL 159 08/15/2013 0818   TRIG 86 08/31/2018 0447   TRIG 52 08/15/2013 0818   HDL 52 08/31/2018 0447   HDL 63 08/15/2013 0818   CHOLHDL 3.3 08/31/2018 0447   VLDL 17 08/31/2018 0447   LDLCALC 104 (H) 08/31/2018 0447   LDLCALC 86 08/15/2013 0818   HgbA1c:  Lab Results  Component Value Date   HGBA1C 6.2 (H) 08/31/2018   Urine Drug Screen:     Component Value Date/Time   LABOPIA NONE DETECTED 08/30/2018 1817   COCAINSCRNUR NONE DETECTED 08/30/2018 1817   LABBENZ NONE DETECTED 08/30/2018 1817   AMPHETMU NONE DETECTED 08/30/2018 1817   THCU NONE DETECTED 08/30/2018 1817   LABBARB NONE DETECTED 08/30/2018 1817    Alcohol Level     Component Value Date/Time   ETH <10 08/30/2018 1657    IMAGING  Ct Angio Head W Or Wo Contrast  Result Date: 08/30/2018 CLINICAL DATA:  Initial evaluation for acute speech difficulty, right facial weakness. EXAM: CT ANGIOGRAPHY HEAD AND NECK CT PERFUSION BRAIN TECHNIQUE: Multidetector CT  imaging of the head and neck was performed using the standard protocol during bolus administration of intravenous contrast. Multiplanar CT image reconstructions and MIPs were obtained to evaluate the vascular anatomy. Carotid stenosis measurements (when applicable) are obtained utilizing NASCET criteria, using the distal internal carotid diameter as the denominator. Multiphase CT imaging of the brain was performed following IV bolus contrast injection. Subsequent parametric perfusion maps were calculated using RAPID software. CONTRAST:  186mL ISOVUE-370 IOPAMIDOL (ISOVUE-370) INJECTION 76% COMPARISON:  Prior noncontrast CT from earlier same day. FINDINGS: CTA NECK FINDINGS Aortic arch: Visualized aortic arch of normal caliber with normal branch pattern. No hemodynamically significant stenosis about the origin of the great vessels. Visualized subclavian arteries widely patent. Right carotid system: Right common carotid artery patent from its origin to the bifurcation without stenosis. Minimal mixed plaque about the right bifurcation without stenosis. Short-segment mild stenosis of approximately 20% by NASCET criteria just distally within the proximal right ICA. Right ICA otherwise widely patent to the skull base without stenosis, dissection, or occlusion. Left carotid system: Left common carotid artery patent from its origin to the bifurcation without stenosis. No significant atheromatous narrowing about the left bifurcation. Left ICA  widely patent from the bifurcation to the skull base without stenosis, dissection, or occlusion. Vertebral arteries: Both of the vertebral arteries arise from the subclavian arteries. Vertebral arteries widely patent within the neck without stenosis, dissection, or occlusion. Skeleton: No acute osseous finding. No discrete lytic or blastic osseous lesions. Mild to moderate cervical spondylolysis at C4-5 through C6-7. Prominent right-sided facet arthrosis noted at C3-4. Other neck: Acute  on chronic right maxillary sinusitis. Soft tissues of the neck demonstrate no other acute finding. Upper chest: Layering fluid noted within the upper esophageal lumen. Visualized upper chest demonstrates no other acute finding. Partially visualized lungs are clear. Review of the MIP images confirms the above findings CTA HEAD FINDINGS Anterior circulation: Petrous ICAs widely patent bilaterally. Cavernous and supraclinoid segments widely patent without stenosis. 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 ICA termini well perfused distally. A1 segments patent bilaterally. Right A1 hypoplastic, likely accounting for the dominant left ICA as compared to the right. Normal anterior communicating artery. Anterior cerebral arteries widely patent to their distal aspects without stenosis. Right M1 widely patent without stenosis. Normal right MCA bifurcation. Distal right MCA branches well perfused. Left M1 patent proximally. 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 Upon review of prior MRI from 2010, this is likely chronic in nature. Stenosis measures approximately 3 mm in length. Left M1 patent distally. Normal left MCA bifurcation. Left MCA branches are opacified distally, although overall attenuated as compared to the right due to the severe M1 stenosis. Posterior circulation: Vertebral arteries patent to the vertebrobasilar junction without stenosis. Posterior inferior cerebral arteries patent bilaterally. Proximal and mid basilar artery diminutive but widely patent without stenosis. Persistent left-sided trigeminal artery supplies the distal basilar artery which is more robust in caliber. Superior cerebral arteries patent bilaterally. Both of the posterior cerebral arteries primarily supplied via the basilar and are well perfused to their distal aspects. Venous sinuses: Grossly patent,  although limited in assessment due to arterial timing the contrast bolus. Anatomic variants: Persistent left trigeminal artery. No intracranial aneurysm. Delayed phase: Not performed. Review of the MIP images confirms the above findings CT Brain Perfusion Findings: CBF (<30%) Volume: 75mL Perfusion (Tmax>6.0s) volume: 156mL Mismatch Volume: 189mL Infarction Location:No core infarct identified by CT perfusion. Large perfusion mismatch throughout the left MCA territory due to the severe left M1 stenosis. IMPRESSION: 1. Negative CTA for large vessel occlusion. No core infarct evident by CT perfusion. 2. Short-segment severe mid left M1 stenosis, likely similar as compared to previous MRI from 11/19/2008. Associated delayed perfusion throughout the left MCA territory distally. 3. No other hemodynamically significant or correctable stenosis within the major arterial vasculature of the head and neck. 4. Persistent left trigeminal artery. Finding discussed with Dr. Leonel Ramsay by telephone at approximately 9:45 p.m. on 08/30/2018. Electronically Signed   By: Jeannine Boga M.D.   On: 08/30/2018 22:59   Dg Chest 2 View  Result Date: 08/30/2018 CLINICAL DATA:  63 year old male with slurred speech and cough today. EXAM: CHEST - 2 VIEW COMPARISON:  08/27/2018 chest radiographs and earlier. FINDINGS: Lower lung volumes on both views. No pneumothorax, pulmonary edema, pleural effusion or consolidation. Mild basilar crowding of markings. Stable cardiac size at the upper limits of normal. Other mediastinal contours are within normal limits. Visualized tracheal air column is within normal limits. No acute osseous abnormality identified. Negative visible bowel gas pattern. Previous ventral abdominal hernia repair  with mesh. IMPRESSION: Lower lung volumes, otherwise no acute cardiopulmonary abnormality. Electronically Signed   By: Genevie Ann M.D.   On: 08/30/2018 17:15   Dg Chest 2 View  Result Date: 08/27/2018 CLINICAL DATA:   Dizziness for 1 day. EXAM: CHEST - 2 VIEW COMPARISON:  Chest CT, 08/14/2013 FINDINGS: The heart size and mediastinal contours are within normal limits. Both lungs are clear. No pleural effusion or pneumothorax. The visualized skeletal structures are unremarkable. IMPRESSION: No active cardiopulmonary disease. Electronically Signed   By: Lajean Manes M.D.   On: 08/27/2018 12:40   Ct Head Wo Contrast  Result Date: 09/01/2018 CLINICAL DATA:  Follow-up stroke.  Increased weakness on the right. EXAM: CT HEAD WITHOUT CONTRAST TECHNIQUE: Contiguous axial images were obtained from the base of the skull through the vertex without intravenous contrast. COMPARISON:  Brain MRI from 2 days ago FINDINGS: Brain: Acute infarct in the left deep watershed/lateral lenticulostriate distribution which has a more confluent low-density appearance than seen on prior diffusion imaging. No new territory involvement. No hemorrhagic conversion. Small remote right parietal cortex infarct. No hydrocephalus or masslike finding Vascular: Atherosclerotic calcification Skull: No acute finding Sinuses/Orbits: Right frontal maxillary and ethmoid sinusitis, also seen on prior study. IMPRESSION: More confluent acute infarct in the left basal ganglia and corona radiata when compared to diffusion imaging 2 days ago. No new distribution infarct or hemorrhagic conversion. Electronically Signed   By: Monte Fantasia M.D.   On: 09/01/2018 09:17   Ct Head Wo Contrast  Result Date: 08/30/2018 CLINICAL DATA:  63 year old male with abnormal speech onset today. EXAM: CT HEAD WITHOUT CONTRAST TECHNIQUE: Contiguous axial images were obtained from the base of the skull through the vertex without intravenous contrast. COMPARISON:  Brain MRI 11/19/2008. FINDINGS: Brain: A small area of right parietal lobe cortical encephalomalacia is redemonstrated on series 3, image 22. Cerebral volume has not significantly changed. New abnormal hypodensity in the left  posterior limb internal capsule and/or lentiform (series 3, image 14). New hypodensity also in the left caudate and anterior lentiform on image 17. No acute intracranial hemorrhage identified. No midline shift, mass effect, or evidence of intracranial mass lesion. No ventriculomegaly. No acute cortically based infarct identified. Incidental tentorial dural calcifications. Vascular: No suspicious intracranial vascular hyperdensity. Skull: Negative. Sinuses/Orbits: Right frontal ethmoid and maxillary sinus disease with fluid levels is new since 2010. The right sphenoid and left paranasal sinuses are spared. Tympanic cavities and mastoids are clear. Other: Mildly Disconjugate gaze, otherwise negative orbits. Visualized scalp soft tissues are within normal limits. IMPRESSION: 1. 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 2. Right [facility] pattern paranasal sinusitis. Electronically Signed   By: Genevie Ann M.D.   On: 08/30/2018 17:39   Ct Angio Neck W Or Wo Contrast  Result Date: 08/30/2018 CLINICAL DATA:  Initial evaluation for acute speech difficulty, right facial weakness. EXAM: CT ANGIOGRAPHY HEAD AND NECK CT PERFUSION BRAIN TECHNIQUE: Multidetector CT imaging of the head and neck was performed using the standard protocol during bolus administration of intravenous contrast. Multiplanar CT image reconstructions and MIPs were obtained to evaluate the vascular anatomy. Carotid stenosis measurements (when applicable) are obtained utilizing NASCET criteria, using the distal internal carotid diameter as the denominator. Multiphase CT imaging of the brain was performed following IV bolus contrast injection. Subsequent parametric perfusion maps were calculated using RAPID software. CONTRAST:  162mL ISOVUE-370 IOPAMIDOL (ISOVUE-370) INJECTION 76% COMPARISON:  Prior noncontrast CT from earlier same day. FINDINGS: CTA NECK  FINDINGS Aortic arch: Visualized aortic arch of normal caliber with  normal branch pattern. No hemodynamically significant stenosis about the origin of the great vessels. Visualized subclavian arteries widely patent. Right carotid system: Right common carotid artery patent from its origin to the bifurcation without stenosis. Minimal mixed plaque about the right bifurcation without stenosis. Short-segment mild stenosis of approximately 20% by NASCET criteria just distally within the proximal right ICA. Right ICA otherwise widely patent to the skull base without stenosis, dissection, or occlusion. Left carotid system: Left common carotid artery patent from its origin to the bifurcation without stenosis. No significant atheromatous narrowing about the left bifurcation. Left ICA widely patent from the bifurcation to the skull base without stenosis, dissection, or occlusion. Vertebral arteries: Both of the vertebral arteries arise from the subclavian arteries. Vertebral arteries widely patent within the neck without stenosis, dissection, or occlusion. Skeleton: No acute osseous finding. No discrete lytic or blastic osseous lesions. Mild to moderate cervical spondylolysis at C4-5 through C6-7. Prominent right-sided facet arthrosis noted at C3-4. 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 Soft tissues of the neck demonstrate no other acute finding. Upper chest: Layering fluid noted within the upper esophageal lumen. Visualized upper chest demonstrates no other acute finding. Partially visualized lungs are clear. Review of the MIP images confirms the above findings CTA HEAD FINDINGS Anterior circulation: Petrous ICAs widely patent bilaterally. Cavernous and supraclinoid segments widely patent without stenosis. 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 ICA termini well perfused distally. A1 segments patent bilaterally. Right A1 hypoplastic, likely accounting for the dominant left ICA as compared to the right. Normal anterior communicating artery.  Anterior cerebral arteries widely patent to their distal aspects without stenosis. Right M1 widely patent without stenosis. Normal right MCA bifurcation. Distal right MCA branches well perfused. Left M1 patent proximally. 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 Upon review of prior MRI from 2010, this is likely chronic in nature. Stenosis measures approximately 3 mm in length. Left M1 patent distally. Normal left MCA bifurcation. Left MCA branches are opacified distally, although overall attenuated as compared to the right due to the severe M1 stenosis. Posterior circulation: Vertebral arteries patent to the vertebrobasilar junction without stenosis. Posterior inferior cerebral arteries patent bilaterally. Proximal and mid basilar artery diminutive but widely patent without stenosis. Persistent left-sided trigeminal artery supplies the distal basilar artery which is more robust in caliber. Superior cerebral arteries patent bilaterally. Both of the posterior cerebral arteries primarily supplied via the basilar and are well perfused to their distal aspects. Venous sinuses: Grossly patent, although limited in assessment due to arterial timing the contrast bolus. Anatomic variants: Persistent left trigeminal artery. No intracranial aneurysm. Delayed phase: Not performed. Review of the MIP images confirms the above findings CT Brain Perfusion Findings: CBF (<30%) Volume: 32mL Perfusion (Tmax>6.0s) volume: 135mL Mismatch Volume: 125mL Infarction Location:No core infarct identified by CT perfusion. Large perfusion mismatch throughout the left MCA territory due to the severe left M1 stenosis. IMPRESSION: 1. Negative CTA for large vessel occlusion. No core infarct evident by CT perfusion. 2. Short-segment severe mid left M1 stenosis, likely similar as compared to previous MRI from 11/19/2008. Associated delayed  perfusion throughout the left MCA territory distally. 3. No other hemodynamically significant or correctable stenosis within the major arterial vasculature of the head and neck. 4. Persistent left trigeminal artery. Finding discussed with Dr. Leonel Ramsay by telephone at  approximately 9:45 p.m. on 08/30/2018. Electronically Signed   By: Jeannine Boga M.D.   On: 08/30/2018 22:59   Mr Brain Wo Contrast  Result Date: 08/30/2018 CLINICAL DATA:  63 y/o  M; right-sided weakness. EXAM: MRI HEAD WITHOUT CONTRAST TECHNIQUE: Multiplanar, multiecho pulse sequences of the brain and surrounding structures were obtained without intravenous contrast. COMPARISON:  08/30/2018 CT head, CT perfusion head, and CTA head. FINDINGS: Brain: Multiple foci of reduced diffusion are present within the left caudate body, left mid and posterior corona radiata, left posterior limb of internal capsule extending into left putamen, as well as several additional small foci of the temporoparietal periventricular white matter and the left frontal operculum compatible with acute/early subacute infarction. No associated hemorrhage or mass effect. Infarcts are T2 FLAIR hyperintense. No acute hemorrhage. No extra-axial collection, hydrocephalus, mass effect, or herniation. Small chronic infarcts in the right parietal cortex and right occipital periventricular white matter. Mild chronic microvascular ischemic changes of the brain. Mild brain parenchymal volume loss. Vascular: Please refer to CT angiogram of the head and neck. Skull and upper cervical spine: Normal marrow signal. Sinuses/Orbits: Right frontal, ethmoid, and maxillary sinus partial opacification and fluid level. No abnormal signal of the mastoid air cells. Orbits are unremarkable. Other: None. IMPRESSION: 1. Multiple small foci of acute/early subacute infarction are present in the left MCA distribution concentrated in left basal ganglia inclusive of the corona radiata and posterior  limb of internal capsule. No hemorrhage or mass effect. 2. Mild chronic microvascular ischemic changes and volume loss of the brain. Small chronic infarcts in right parietal and occipital lobes. 3. Right frontal, ethmoid, and maxillary sinus disease is a right middle meatus obstructive pattern, direct visualization recommended. These results were called by telephone at the time of interpretation on 08/30/2018 at 10:46 pm to Dr. Leonel Ramsay, who verbally acknowledged these results. Electronically Signed   By: Kristine Garbe M.D.   On: 08/30/2018 22:48   Ct Cerebral Perfusion W Contrast  Result Date: 08/30/2018 CLINICAL DATA:  Initial evaluation for acute speech difficulty, right facial weakness. EXAM: CT ANGIOGRAPHY HEAD AND NECK CT PERFUSION BRAIN TECHNIQUE: Multidetector CT imaging of the head and neck was performed using the standard protocol during bolus administration of intravenous contrast. Multiplanar CT image reconstructions and MIPs were obtained to evaluate the vascular anatomy. Carotid stenosis measurements (when applicable) are obtained utilizing NASCET criteria, using the distal internal carotid diameter as the denominator. Multiphase CT imaging of the brain was performed following IV bolus contrast injection. Subsequent parametric perfusion maps were calculated using RAPID software. CONTRAST:  138mL ISOVUE-370 IOPAMIDOL (ISOVUE-370) INJECTION 76% COMPARISON:  Prior noncontrast CT from earlier same day. FINDINGS: CTA NECK FINDINGS Aortic arch: Visualized aortic arch of normal caliber with normal branch pattern. No hemodynamically significant stenosis about the origin of the great vessels. Visualized subclavian arteries widely patent. Right carotid system: Right common carotid artery patent from its origin to the bifurcation without stenosis. Minimal mixed plaque about the right bifurcation without stenosis. Short-segment mild stenosis of approximately 20% by NASCET criteria just distally  within the proximal right ICA. Right ICA otherwise widely patent to the skull base without stenosis, dissection, or occlusion. Left carotid system: Left common carotid artery patent from its origin to the bifurcation without stenosis. No significant atheromatous narrowing about the left bifurcation. Left ICA widely patent from the bifurcation to the skull base without stenosis, dissection, or occlusion. Vertebral arteries: Both of the vertebral arteries arise from the subclavian arteries. Vertebral arteries widely patent within  the neck without stenosis, dissection, or occlusion. Skeleton: No acute osseous finding. No discrete lytic or blastic osseous lesions. Mild to moderate cervical spondylolysis at C4-5 through C6-7. Prominent right-sided facet arthrosis noted at C3-4. 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 Soft tissues of the neck demonstrate no other acute finding. Upper chest: Layering fluid noted within the upper esophageal lumen. Visualized upper chest demonstrates no other acute finding. Partially visualized lungs are clear. Review of the MIP images confirms the above findings CTA HEAD FINDINGS Anterior circulation: Petrous ICAs widely patent bilaterally. Cavernous and supraclinoid segments widely patent without stenosis. 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 ICA termini well perfused distally. A1 segments patent bilaterally. Right A1 hypoplastic, likely accounting for the dominant left ICA as compared to the right. Normal anterior communicating artery. Anterior cerebral arteries widely patent to their distal aspects without stenosis. Right M1 widely patent without stenosis. Normal right MCA bifurcation. Distal right MCA branches well perfused. Left M1 patent proximally. 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 Upon review of prior MRI from 2010,  this is likely chronic in nature. Stenosis measures approximately 3 mm in length. Left M1 patent distally. Normal left MCA bifurcation. Left MCA branches are opacified distally, although overall attenuated as compared to the right due to the severe M1 stenosis. Posterior circulation: Vertebral arteries patent to the vertebrobasilar junction without stenosis. Posterior inferior cerebral arteries patent bilaterally. Proximal and mid basilar artery diminutive but widely patent without stenosis. Persistent left-sided trigeminal artery supplies the distal basilar artery which is more robust in caliber. Superior cerebral arteries patent bilaterally. Both of the posterior cerebral arteries primarily supplied via the basilar and are well perfused to their distal aspects. Venous sinuses: Grossly patent, although limited in assessment due to arterial timing the contrast bolus. Anatomic variants: Persistent left trigeminal artery. No intracranial aneurysm. Delayed phase: Not performed. Review of the MIP images confirms the above findings CT Brain Perfusion Findings: CBF (<30%) Volume: 28mL Perfusion (Tmax>6.0s) volume: 142mL Mismatch Volume: 128mL Infarction Location:No core infarct identified by CT perfusion. Large perfusion mismatch throughout the left MCA territory due to the severe left M1 stenosis. IMPRESSION: 1. Negative CTA for large vessel occlusion. No core infarct evident by CT perfusion. 2. Short-segment severe mid left M1 stenosis, likely similar as compared to previous MRI from 11/19/2008. Associated delayed perfusion throughout the left MCA territory distally. 3. No other hemodynamically significant or correctable stenosis within the major arterial vasculature of the head and neck. 4. Persistent left trigeminal artery. Finding discussed with Dr. Leonel Ramsay by telephone at approximately 9:45 p.m. on 08/30/2018. Electronically Signed   By: Jeannine Boga M.D.   On: 08/30/2018 22:59   Vas [place] Transcranial  Doppler W Bubbles  Result Date: 09/01/2018  Transcranial Doppler with Bubble Indications: Stroke. Performing Technologist: Maudry Mayhew MHA, RDMS, RVT, RDCS  Examination Guidelines: A complete evaluation includes B-mode imaging, spectral Doppler, color Doppler, and power Doppler as needed of all accessible portions of each vessel. Bilateral testing is considered an integral part of a complete examination. Limited examinations for reoccurring indications may be performed as noted.  Summary:  A vascular evaluation was performed. The right middle cerebral artery was studied. An IV was inserted into the patient's left forearm. Verbal informed consent was obtained.  There is no evidence of HITS (high intensity transient signals) at rest or with Valsalva. Therefore, there is no evidence  of PFO (patent foramen ovale). *See table(s) above for measurements and observations.    Preliminary    Vas [place] Lower Extremity Venous (dvt)  Result Date: 09/01/2018  Lower Venous Study Indications: Stroke.  Performing Technologist: Maudry Mayhew MHA, RDMS, RVT, RDCS  Examination Guidelines: A complete evaluation includes B-mode imaging, spectral Doppler, color Doppler, and power Doppler as needed of all accessible portions of each vessel. Bilateral testing is considered an integral part of a complete examination. Limited examinations for reoccurring indications may be performed as noted.  Right Venous Findings: +---------+---------------+---------+-----------+----------+--------------+          CompressibilityPhasicitySpontaneityPropertiesSummary        +---------+---------------+---------+-----------+----------+--------------+ CFV      Full           Yes      Yes                                 +---------+---------------+---------+-----------+----------+--------------+ SFJ      Full                                                         +---------+---------------+---------+-----------+----------+--------------+ FV Prox  Full                                                        +---------+---------------+---------+-----------+----------+--------------+ FV Mid   Full                                                        +---------+---------------+---------+-----------+----------+--------------+ FV DistalFull                                                        +---------+---------------+---------+-----------+----------+--------------+ PFV      Full                                                        +---------+---------------+---------+-----------+----------+--------------+ POP      Full                    Yes                  Rouleaux flow  +---------+---------------+---------+-----------+----------+--------------+ PTV      Full                                                        +---------+---------------+---------+-----------+----------+--------------+ PERO  Not visualized +---------+---------------+---------+-----------+----------+--------------+  Left Venous Findings: +---------+---------------+---------+-----------+----------+-------+          CompressibilityPhasicitySpontaneityPropertiesSummary +---------+---------------+---------+-----------+----------+-------+ CFV      Full           No       Yes                          +---------+---------------+---------+-----------+----------+-------+ SFJ      Full                                                 +---------+---------------+---------+-----------+----------+-------+ FV Prox  Full                                                 +---------+---------------+---------+-----------+----------+-------+ FV Mid   Full                                                 +---------+---------------+---------+-----------+----------+-------+ FV DistalFull                                                  +---------+---------------+---------+-----------+----------+-------+ PFV      Full                                                 +---------+---------------+---------+-----------+----------+-------+ POP      Full                    Yes                          +---------+---------------+---------+-----------+----------+-------+ PTV      Full                                                 +---------+---------------+---------+-----------+----------+-------+ PERO     Full                                                 +---------+---------------+---------+-----------+----------+-------+    Summary: Right: There is no evidence of deep vein thrombosis in the lower extremity. However, portions of this examination were limited- see technologist comments above. No cystic structure found in the popliteal fossa. Left: There is no evidence of deep vein thrombosis in the lower extremity. However, portions of this examination were limited- see technologist comments above. No cystic structure found in the popliteal fossa.  Pulsatile lower extremity venous flow is suggestive of possible elevated right-sided heart pressure. *See table(s) above for measurements and observations. Electronically signed by Monica Martinez MD  on 09/01/2018 at 3:44:34 PM.    Final    Transthoracic Echocardiogram  Normal ejection fraction 55-60%. No left ventricular hypertrophy or clot.  TEE - pending    PHYSICAL EXAM  Temp:  [98.3 F (36.8 C)-99.5 F (37.5 C)] 98.6 F (37 C) (02/08 1258) Pulse Rate:  [53-64] 64 (02/08 1258) Resp:  [16-18] 16 (02/08 1258) BP: (117-149)/(66-79) 133/75 (02/08 1258) SpO2:  [94 %-100 %] 95 % (02/08 1258)  General - Well nourished, well developed, in no apparent distress.  Ophthalmologic - fundi not visualized due to noncooperation.  Cardiovascular - Regular rate and rhythm.  Mental Status -  Level of arousal and orientation to  place, and person were intact, but not orientated to time. Able to repeat simple sentences but not able to name. Following all simple commands, but paucity of speech with moderate dysarthria. Perseveration on numbers    Cranial Nerves II - XII - II - Visual field intact OU. III, IV, VI - Extraocular movements intact. V - Facial sensation decreased on the right. VII - right facial droop. VIII - Hearing & vestibular intact bilaterally. X - Palate elevates symmetrically, moderate dysarthria with hypophonia. XI - Chin turning & shoulder shrug intact bilaterally. XII - Tongue protrusion intact.  Motor Strength - The patient's strength was normal in LUE and LLE, but right hemiplegia 0/5 RUE and 1/5 [name].  Bulk was normal and fasciculations were absent.   Motor Tone - Muscle tone was assessed at the neck and appendages and was normal.  Reflexes - The patient's reflexes were symmetrical in all extremities and he had right babinski positive.   Sensory - Light touch, temperature/pinprick were assessed and were decreased on the right  Coordination - The patient had normal movements in the left hand with no ataxia or dysmetria.  Tremor was absent.  Gait and Station - deferred.   ASSESSMENT/PLAN 63 year old male who is very fit/healthy at baseline. MRI shows new scattered left acute ischemic strokes. CTA shows L M1 near occulsion. He arrived greater than 24h from time since last well, therefore no acute stroke interventions done. 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 Stroke wk up underway. Pt and SO deny any palpations. When pts dtrs arrive, they both explain that in 2016 after tricep repair surgery they noted a possible arrhythmia and placed him on holter monitor for 30d, but was unrevealing.    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 patient has had neurological  worsening. Extension of deficits on 09/01/18.  Resultant right side weakness, dysarthria and mixed aphasia  CT head patchy areas in the left hemisphere MRI head -  MRA head - scattered L MCA infarcts, mostly concentrated in L BG  CTA H&N- severe stenosis versus occlusion of the left M1  CT perfusion no infarct core but large penumbra  2D Echo -EF 55 to 60%, normal size of left atrium  TCD bubble study no PFO  LE venous Doppler negative for DVT  Recommend TEE and loop recorder to evaluate cardioembolic source  LDL - 716  HgbA1c - 6.2  UDS - neg  VTE prophylaxis - lovenox  prior to admission was taking 81mg  ASA, now on DAPT after Plavix load of 300mg   Patient counseled to be compliant with his antithrombotic medications  Ongoing aggressive stroke risk factor management  Therapy recommendations:  CIR  Disposition:  Pending  History of heart palpitation  Patient seem  to have one episode of racing heart in 2016  48-hour Holter monitoring 06/15/2015 unrevealing  Recommend loop recorder for further evaluation given current embolic stroke  Hypertension  Stable  Permissive hypertension (OK if <180/105) but gradually normalize in 3-5 days  Avoid hypotension . 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 Prozac started 08/31/2018 as this has been shown to also improve motor deficits in stroke pts.   Hospital day # 2   Rosalin Hawking, MD PhD Stroke Neurology 09/02/2018 6:44 PM   To contact Stroke Continuity provider, please refer to [email or web address] After hours, contact General Neurology

## 2018-09-03 ENCOUNTER — Inpatient Hospital Stay (HOSPITAL_COMMUNITY): Payer: BLUE CROSS/BLUE SHIELD

## 2018-09-03 DIAGNOSIS — S62101A Fracture of unspecified carpal bone, right wrist, initial encounter for closed fracture: Secondary | ICD-10-CM

## 2018-09-03 MED ORDER — BISACODYL 10 MG RE SUPP
10.0000 mg | Freq: Once | RECTAL | Status: AC
  Administered 2018-09-03: 10 mg via RECTAL
  Filled 2018-09-03: qty 1

## 2018-09-03 MED ORDER — FLEET ENEMA 7-19 GM/118ML RE ENEM: 1.0000 | ENEMA | Freq: Every day | RECTAL | Status: DC | PRN

## 2018-09-03 NOTE — Progress Notes (Signed)
PROGRESS NOTE    Edward Glass   SVX:[id number]  DOB: 12-29-1955  DOA: 08/30/2018 PCP: Lurline Hare, MD   Brief Narrative:  Edward Glass is a 63 y.o. male with medical history significant of hypogonadism, possible BPH, who presents with   difficulty speaking, right facial droop and right arm and leg weakness.   He was not a candidate for TPA. Given Aspirin and a Plavix load.   Subjective: Swelling of right wrist. Family thinks he may have fallen on it the day he had his stroke.    Assessment & Plan:   Principal Problem:   CVA (cerebral vascular accident)- right visual field cut, right facial droop, right arm/ leg weaknes, dysarthria, mild receptive aphasia - CTA head/neck> Short-segment severe mid left M1 stenosis, likely similar as compared to previous MRI from 11/19/2008. - MRI> Multiple small foci of acute/early subacute infarction are present in the left MCA distribution concentrated in left basal ganglia inclusive of the corona radiata and posterior limb of internal capsule. - LDL 104- Lipitor added - A1c 6.2- discuss diet control and repeat A1c in about 1 month- will ask for dietician consult  - PT eval completed- recommended CIR - I have consulted them - ECHO shows no thrombus - Neuro suspecting embolic infarct -2/7, symptoms noted to have progressed to where he can no longer move right arm or leg - CT head> More confluent acute infarct in the left basal ganglia and corona radiata - TEE and loop recorder pending - per MBS and SLP eval, he needs mechanical soft with nectar thick liquids at this point due to oropharyngeal dysphagia- meds crushed with pureed  Active Problems: Right wrist fracture - Xray reveals a "Possible fracture of the triquetrum"- I spoke with Dr Stann Mainland who recommends, Ice, NSAIDs and a brace to help reduce pain- d/w patient and family  BPH - Alfuzosin continued  Hypogonadism   Time spent in minutes: 35 DVT prophylaxis:  Lovenox Code Status: Full code Family Communication: daughters, brother Disposition Plan: to be determined - awaiting TEE and then transfer to CIR Consultants:   Neuro Procedures:   ECHO 1. The left ventricle has normal systolic function of 23-30%. The cavity size was normal. There is no increased left ventricular wall thickness. Echo evidence of impaired diastolic relaxation.  2. The right ventricle has normal systolic function. The cavity was normal. There is no increase in right ventricular wall thickness.  3. The mitral valve is normal in structure. No evidence of mitral valve stenosis. No significant mitral regurgitation.  4. The tricuspid valve is normal in structure.  5. The aortic valve is tricuspid There is mild calcification of the aortic valve. No aortic stenosis.  6. The pulmonic valve was normal in structure.  7. The aortic root and ascending aorta are normal in size and structure.  8. No evidence of left ventricular regional wall motion abnormalities.  9. The inferior vena cava is normal in size with greater than 50% respiratory variability. 10. No complete TR doppler jet so unable to estimate PA systolic pressure.  Antimicrobials:  Anti-infectives (From admission, onward)   None       Objective: Vitals:   09/02/18 2329 09/03/18 0321 09/03/18 0838 09/03/18 1258  BP: (!) 156/84 (!) 141/75 121/78 131/77  Pulse: 63 60 63 (!) 59  Resp: 16 16 18 18   Temp: 99.6 F (37.6 C) 98.9 F (37.2 C) 98.9 F (37.2 C) 98.5 F (36.9 C)  TempSrc: Oral Oral Oral Oral  SpO2: 96% 97% 95% 97%  Weight:      Height:        Intake/Output Summary (Last 24 hours) at 09/03/2018 1410 Last data filed at 09/03/2018 1207 Gross per 24 hour  Intake 480 ml  Output 1450 ml  Net -970 ml   Filed Weights   08/30/18 1546  Weight: 79.3 kg    Examination: General exam: Appears comfortable  HEENT: PERRLA, oral mucosa moist, no sclera icterus or thrush Respiratory system: Clear to  auscultation. Respiratory effort normal. Cardiovascular system: S1 & S2 heard,  No murmurs  Gastrointestinal system: Abdomen soft, non-tender, nondistended. Normal bowel sound. No organomegaly Central nervous system: Alert and oriented. 0/5 strength in right arm and leg Extremities: No cyanosis, clubbing or edema Skin: No rashes or ulcers Psychiatry:  Mood & affect appropriate.     Data Reviewed: I have personally reviewed following labs and imaging studies  CBC: Recent Labs  Lab 08/30/18 1546  WBC 6.5  NEUTROABS 3.8  HGB 15.4  HCT 48.5  MCV 91.9  PLT 299   Basic Metabolic Panel: Recent Labs  Lab 08/27/18 1412 08/30/18 1546  NA 137 140  K 4.0 3.7  CL 107 105  CO2 23 23  GLUCOSE 102* 100*  BUN 23 17  CREATININE 0.78 0.91  CALCIUM 9.0 9.4   GFR: Estimated Creatinine Clearance: 80.6 mL/min (by C-G formula based on SCr of 0.91 mg/dL). Liver Function Tests: Recent Labs  Lab 08/30/18 1546  AST 40  ALT 35  ALKPHOS 65  BILITOT 0.4  PROT 7.1  ALBUMIN 3.8   No results for input(s): LIPASE, AMYLASE in the last 168 hours. No results for input(s): AMMONIA in the last 168 hours. Coagulation Profile: No results for input(s): INR, PROTIME in the last 168 hours. Cardiac Enzymes: No results for input(s): CKTOTAL, CKMB, CKMBINDEX, TROPONINI in the last 168 hours. BNP (last 3 results) No results for input(s): PROBNP in the last 8760 hours. HbA1C: No results for input(s): HGBA1C in the last 72 hours. CBG: No results for input(s): GLUCAP in the last 168 hours. Lipid Profile: No results for input(s): CHOL, HDL, LDLCALC, TRIG, CHOLHDL, LDLDIRECT in the last 72 hours. Thyroid Function Tests: No results for input(s): TSH, T4TOTAL, FREET4, T3FREE, THYROIDAB in the last 72 hours. Anemia Panel: No results for input(s): VITAMINB12, FOLATE, FERRITIN, TIBC, IRON, RETICCTPCT in the last 72 hours. Urine analysis:    Component Value Date/Time   COLORURINE STRAW (A) 08/30/2018 1801    APPEARANCEUR CLEAR 08/30/2018 1801   LABSPEC 1.014 08/30/2018 1801   PHURINE 5.0 08/30/2018 1801   GLUCOSEU NEGATIVE 08/30/2018 1801   HGBUR SMALL (A) 08/30/2018 1801   BILIRUBINUR NEGATIVE 08/30/2018 1801   BILIRUBINUR neg 10/03/2013 1529   KETONESUR NEGATIVE 08/30/2018 1801   PROTEINUR NEGATIVE 08/30/2018 1801   UROBILINOGEN 0.2 10/03/2013 1529   NITRITE NEGATIVE 08/30/2018 1801   LEUKOCYTESUR NEGATIVE 08/30/2018 1801   Sepsis Labs: @LABRCNTIP (procalcitonin:4,lacticidven:4) )No results found for this or any previous visit (from the past 240 hour(s)).       Radiology Studies: Dg Hand Complete Right  Result Date: 09/03/2018 CLINICAL DATA:  Pt was a recent admit into the hospital for a stroke. Pt fell 4 days ago and caught himself with his right hand. Pt c/o today of right hand pain and swelling even after the stroke affected the patient's right side. EXAM: RIGHT HAND - COMPLETE 3+ VIEW COMPARISON:  None. FINDINGS: There is subtle density proximal to the triquetrum/the deformed, also identified along  the dorsum of the wrist on the LATERAL view. There is significant soft tissue swelling along the dorsum of the hand. IMPRESSION: 1. Soft tissue swelling. 2. Possible fracture of the triquetrum. Electronically Signed   By: Nolon Nations M.D.   On: 09/03/2018 12:49   Vas [place] Transcranial Doppler W Bubbles  Result Date: 09/01/2018  Transcranial Doppler with Bubble Indications: Stroke. Performing Technologist: Maudry Mayhew MHA, RDMS, RVT, RDCS  Examination Guidelines: A complete evaluation includes B-mode imaging, spectral Doppler, color Doppler, and power Doppler as needed of all accessible portions of each vessel. Bilateral testing is considered an integral part of a complete examination. Limited examinations for reoccurring indications may be performed as noted.  Summary:  A vascular evaluation was performed. The right middle cerebral artery was studied. An IV was inserted into the  patient's left forearm. Verbal informed consent was obtained.  There is no evidence of HITS (high intensity transient signals) at rest or with Valsalva. Therefore, there is no evidence of PFO (patent foramen ovale). *See table(s) above for measurements and observations.    Preliminary    Vas [place] Lower Extremity Venous (dvt)  Result Date: 09/01/2018  Lower Venous Study Indications: Stroke.  Performing Technologist: Maudry Mayhew MHA, RDMS, RVT, RDCS  Examination Guidelines: A complete evaluation includes B-mode imaging, spectral Doppler, color Doppler, and power Doppler as needed of all accessible portions of each vessel. Bilateral testing is considered an integral part of a complete examination. Limited examinations for reoccurring indications may be performed as noted.  Right Venous Findings: +---------+---------------+---------+-----------+----------+--------------+          CompressibilityPhasicitySpontaneityPropertiesSummary        +---------+---------------+---------+-----------+----------+--------------+ CFV      Full           Yes      Yes                                 +---------+---------------+---------+-----------+----------+--------------+ SFJ      Full                                                        +---------+---------------+---------+-----------+----------+--------------+ FV Prox  Full                                                        +---------+---------------+---------+-----------+----------+--------------+ FV Mid   Full                                                        +---------+---------------+---------+-----------+----------+--------------+ FV DistalFull                                                        +---------+---------------+---------+-----------+----------+--------------+ PFV      Full                                                         +---------+---------------+---------+-----------+----------+--------------+  POP      Full                    Yes                  Rouleaux flow  +---------+---------------+---------+-----------+----------+--------------+ PTV      Full                                                        +---------+---------------+---------+-----------+----------+--------------+ PERO                                                  Not visualized +---------+---------------+---------+-----------+----------+--------------+  Left Venous Findings: +---------+---------------+---------+-----------+----------+-------+          CompressibilityPhasicitySpontaneityPropertiesSummary +---------+---------------+---------+-----------+----------+-------+ CFV      Full           No       Yes                          +---------+---------------+---------+-----------+----------+-------+ SFJ      Full                                                 +---------+---------------+---------+-----------+----------+-------+ FV Prox  Full                                                 +---------+---------------+---------+-----------+----------+-------+ FV Mid   Full                                                 +---------+---------------+---------+-----------+----------+-------+ FV DistalFull                                                 +---------+---------------+---------+-----------+----------+-------+ PFV      Full                                                 +---------+---------------+---------+-----------+----------+-------+ POP      Full                    Yes                          +---------+---------------+---------+-----------+----------+-------+ PTV      Full                                                 +---------+---------------+---------+-----------+----------+-------+  PERO     Full                                                  +---------+---------------+---------+-----------+----------+-------+    Summary: Right: There is no evidence of deep vein thrombosis in the lower extremity. However, portions of this examination were limited- see technologist comments above. No cystic structure found in the popliteal fossa. Left: There is no evidence of deep vein thrombosis in the lower extremity. However, portions of this examination were limited- see technologist comments above. No cystic structure found in the popliteal fossa.  Pulsatile lower extremity venous flow is suggestive of possible elevated right-sided heart pressure. *See table(s) above for measurements and observations. Electronically signed by Monica Martinez MD on 09/01/2018 at 3:44:34 PM.    Final       Scheduled Meds: . alfuzosin  10 mg Oral Once per day on Mon Wed Fri  . aspirin EC  81 mg Oral Daily  . atorvastatin  40 mg Oral q1800  . clopidogrel  75 mg Oral Daily  . enoxaparin (LOVENOX) injection  40 mg Subcutaneous Q24H  . multivitamin with minerals  1 tablet Oral Daily   Continuous Infusions:    LOS: 3 days      Debbe Odea, MD [facility] Pager: [email or web address] Password [email or web address] 09/03/2018, 2:10 PM

## 2018-09-03 NOTE — Progress Notes (Signed)
Orthopedic Tech Progress Note Patient Details:  Edward Glass [birth year] [id number]  Ortho Devices Type of Ortho Device: Wrist splint Ortho Device/Splint Interventions: Application   Post Interventions Patient Tolerated: Well Instructions Provided: Care of device   [place] Pink 09/03/2018, 5:29 PM

## 2018-09-03 NOTE — Progress Notes (Addendum)
STROKE TEAM PROGRESS NOTE   SUBJECTIVE (INTERVAL HISTORY) His brother is at the bedside.  The patient still has right facial droop and right hemiplegia. Partial expressive aphasia.  No acute event overnight, neuro stable.  Pending TEE and loop tomorrow.   OBJECTIVE Vitals:   09/02/18 1915 09/02/18 2329 09/03/18 0321 09/03/18 0838  BP: (!) 156/87 (!) 156/84 (!) 141/75 121/78  Pulse: 64 63 60 63  Resp: 16 16 16 18   Temp: 98.6 F (37 C) 99.6 F (37.6 C) 98.9 F (37.2 C) 98.9 F (37.2 C)  TempSrc: Oral Oral Oral Oral  SpO2: 96% 96% 97% 95%  Weight:      Height:        CBC:  Recent Labs  Lab 08/30/18 1546  WBC 6.5  NEUTROABS 3.8  HGB 15.4  HCT 48.5  MCV 91.9  PLT 242    Basic Metabolic Panel:  Recent Labs  Lab 08/27/18 1412 08/30/18 1546  NA 137 140  K 4.0 3.7  CL 107 105  CO2 23 23  GLUCOSE 102* 100*  BUN 23 17  CREATININE 0.78 0.91  CALCIUM 9.0 9.4    Lipid Panel:     Component Value Date/Time   CHOL 173 08/31/2018 0447   CHOL 159 08/15/2013 0818   TRIG 86 08/31/2018 0447   TRIG 52 08/15/2013 0818   HDL 52 08/31/2018 0447   HDL 63 08/15/2013 0818   CHOLHDL 3.3 08/31/2018 0447   VLDL 17 08/31/2018 0447   LDLCALC 104 (H) 08/31/2018 0447   LDLCALC 86 08/15/2013 0818   HgbA1c:  Lab Results  Component Value Date   HGBA1C 6.2 (H) 08/31/2018   Urine Drug Screen:     Component Value Date/Time   LABOPIA NONE DETECTED 08/30/2018 1817   COCAINSCRNUR NONE DETECTED 08/30/2018 1817   LABBENZ NONE DETECTED 08/30/2018 1817   AMPHETMU NONE DETECTED 08/30/2018 1817   THCU NONE DETECTED 08/30/2018 1817   LABBARB NONE DETECTED 08/30/2018 1817    Alcohol Level     Component Value Date/Time   ETH <10 08/30/2018 1657    IMAGING  Ct Angio Head W Or Wo Contrast  Result Date: 08/30/2018 CLINICAL DATA:  Initial evaluation for acute speech difficulty, right facial weakness. EXAM: CT ANGIOGRAPHY HEAD AND NECK CT PERFUSION BRAIN TECHNIQUE: Multidetector CT  imaging of the head and neck was performed using the standard protocol during bolus administration of intravenous contrast. Multiplanar CT image reconstructions and MIPs were obtained to evaluate the vascular anatomy. Carotid stenosis measurements (when applicable) are obtained utilizing NASCET criteria, using the distal internal carotid diameter as the denominator. Multiphase CT imaging of the brain was performed following IV bolus contrast injection. Subsequent parametric perfusion maps were calculated using RAPID software. CONTRAST:  144mL ISOVUE-370 IOPAMIDOL (ISOVUE-370) INJECTION 76% COMPARISON:  Prior noncontrast CT from earlier same day. FINDINGS: CTA NECK FINDINGS Aortic arch: Visualized aortic arch of normal caliber with normal branch pattern. No hemodynamically significant stenosis about the origin of the great vessels. Visualized subclavian arteries widely patent. Right carotid system: Right common carotid artery patent from its origin to the bifurcation without stenosis. Minimal mixed plaque about the right bifurcation without stenosis. Short-segment mild stenosis of approximately 20% by NASCET criteria just distally within the proximal right ICA. Right ICA otherwise widely patent to the skull base without stenosis, dissection, or occlusion. Left carotid system: Left common carotid artery patent from its origin to the bifurcation without stenosis. No significant atheromatous narrowing about the left bifurcation. Left ICA widely  patent from the bifurcation to the skull base without stenosis, dissection, or occlusion. Vertebral arteries: Both of the vertebral arteries arise from the subclavian arteries. Vertebral arteries widely patent within the neck without stenosis, dissection, or occlusion. Skeleton: No acute osseous finding. No discrete lytic or blastic osseous lesions. Mild to moderate cervical spondylolysis at C4-5 through C6-7. Prominent right-sided facet arthrosis noted at C3-4. Other neck: Acute  on chronic right maxillary sinusitis. Soft tissues of the neck demonstrate no other acute finding. Upper chest: Layering fluid noted within the upper esophageal lumen. Visualized upper chest demonstrates no other acute finding. Partially visualized lungs are clear. Review of the MIP images confirms the above findings CTA HEAD FINDINGS Anterior circulation: Petrous ICAs widely patent bilaterally. Cavernous and supraclinoid segments widely patent without stenosis. 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 ICA termini well perfused distally. A1 segments patent bilaterally. Right A1 hypoplastic, likely accounting for the dominant left ICA as compared to the right. Normal anterior communicating artery. Anterior cerebral arteries widely patent to their distal aspects without stenosis. Right M1 widely patent without stenosis. Normal right MCA bifurcation. Distal right MCA branches well perfused. Left M1 patent proximally. 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 Upon review of prior MRI from 2010, this is likely chronic in nature. Stenosis measures approximately 3 mm in length. Left M1 patent distally. Normal left MCA bifurcation. Left MCA branches are opacified distally, although overall attenuated as compared to the right due to the severe M1 stenosis. Posterior circulation: Vertebral arteries patent to the vertebrobasilar junction without stenosis. Posterior inferior cerebral arteries patent bilaterally. Proximal and mid basilar artery diminutive but widely patent without stenosis. Persistent left-sided trigeminal artery supplies the distal basilar artery which is more robust in caliber. Superior cerebral arteries patent bilaterally. Both of the posterior cerebral arteries primarily supplied via the basilar and are well perfused to their distal aspects. Venous sinuses: Grossly patent,  although limited in assessment due to arterial timing the contrast bolus. Anatomic variants: Persistent left trigeminal artery. No intracranial aneurysm. Delayed phase: Not performed. Review of the MIP images confirms the above findings CT Brain Perfusion Findings: CBF (<30%) Volume: 56mL Perfusion (Tmax>6.0s) volume: 135mL Mismatch Volume: 151mL Infarction Location:No core infarct identified by CT perfusion. Large perfusion mismatch throughout the left MCA territory due to the severe left M1 stenosis. IMPRESSION: 1. Negative CTA for large vessel occlusion. No core infarct evident by CT perfusion. 2. Short-segment severe mid left M1 stenosis, likely similar as compared to previous MRI from 11/19/2008. Associated delayed perfusion throughout the left MCA territory distally. 3. No other hemodynamically significant or correctable stenosis within the major arterial vasculature of the head and neck. 4. Persistent left trigeminal artery. Finding discussed with Dr. Leonel Ramsay by telephone at approximately 9:45 p.m. on 08/30/2018. Electronically Signed   By: Jeannine Boga M.D.   On: 08/30/2018 22:59   Dg Chest 2 View  Result Date: 08/30/2018 CLINICAL DATA:  63 year old male with slurred speech and cough today. EXAM: CHEST - 2 VIEW COMPARISON:  08/27/2018 chest radiographs and earlier. FINDINGS: Lower lung volumes on both views. No pneumothorax, pulmonary edema, pleural effusion or consolidation. Mild basilar crowding of markings. Stable cardiac size at the upper limits of normal. Other mediastinal contours are within normal limits. Visualized tracheal air column is within normal limits. No acute osseous abnormality identified. Negative visible bowel gas pattern. Previous ventral abdominal hernia repair with  mesh. IMPRESSION: Lower lung volumes, otherwise no acute cardiopulmonary abnormality. Electronically Signed   By: Genevie Ann M.D.   On: 08/30/2018 17:15   Dg Chest 2 View  Result Date: 08/27/2018 CLINICAL DATA:   Dizziness for 1 day. EXAM: CHEST - 2 VIEW COMPARISON:  Chest CT, 08/14/2013 FINDINGS: The heart size and mediastinal contours are within normal limits. Both lungs are clear. No pleural effusion or pneumothorax. The visualized skeletal structures are unremarkable. IMPRESSION: No active cardiopulmonary disease. Electronically Signed   By: Lajean Manes M.D.   On: 08/27/2018 12:40   Ct Head Wo Contrast  Result Date: 09/01/2018 CLINICAL DATA:  Follow-up stroke.  Increased weakness on the right. EXAM: CT HEAD WITHOUT CONTRAST TECHNIQUE: Contiguous axial images were obtained from the base of the skull through the vertex without intravenous contrast. COMPARISON:  Brain MRI from 2 days ago FINDINGS: Brain: Acute infarct in the left deep watershed/lateral lenticulostriate distribution which has a more confluent low-density appearance than seen on prior diffusion imaging. No new territory involvement. No hemorrhagic conversion. Small remote right parietal cortex infarct. No hydrocephalus or masslike finding Vascular: Atherosclerotic calcification Skull: No acute finding Sinuses/Orbits: Right frontal maxillary and ethmoid sinusitis, also seen on prior study. IMPRESSION: More confluent acute infarct in the left basal ganglia and corona radiata when compared to diffusion imaging 2 days ago. No new distribution infarct or hemorrhagic conversion. Electronically Signed   By: Monte Fantasia M.D.   On: 09/01/2018 09:17   Ct Head Wo Contrast  Result Date: 08/30/2018 CLINICAL DATA:  63 year old male with abnormal speech onset today. EXAM: CT HEAD WITHOUT CONTRAST TECHNIQUE: Contiguous axial images were obtained from the base of the skull through the vertex without intravenous contrast. COMPARISON:  Brain MRI 11/19/2008. FINDINGS: Brain: A small area of right parietal lobe cortical encephalomalacia is redemonstrated on series 3, image 22. Cerebral volume has not significantly changed. New abnormal hypodensity in the left  posterior limb internal capsule and/or lentiform (series 3, image 14). New hypodensity also in the left caudate and anterior lentiform on image 17. No acute intracranial hemorrhage identified. No midline shift, mass effect, or evidence of intracranial mass lesion. No ventriculomegaly. No acute cortically based infarct identified. Incidental tentorial dural calcifications. Vascular: No suspicious intracranial vascular hyperdensity. Skull: Negative. Sinuses/Orbits: Right frontal ethmoid and maxillary sinus disease with fluid levels is new since 2010. The right sphenoid and left paranasal sinuses are spared. Tympanic cavities and mastoids are clear. Other: Mildly Disconjugate gaze, otherwise negative orbits. Visualized scalp soft tissues are within normal limits. IMPRESSION: 1. 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 2. Right [facility] pattern paranasal sinusitis. Electronically Signed   By: Genevie Ann M.D.   On: 08/30/2018 17:39   Ct Angio Neck W Or Wo Contrast  Result Date: 08/30/2018 CLINICAL DATA:  Initial evaluation for acute speech difficulty, right facial weakness. EXAM: CT ANGIOGRAPHY HEAD AND NECK CT PERFUSION BRAIN TECHNIQUE: Multidetector CT imaging of the head and neck was performed using the standard protocol during bolus administration of intravenous contrast. Multiplanar CT image reconstructions and MIPs were obtained to evaluate the vascular anatomy. Carotid stenosis measurements (when applicable) are obtained utilizing NASCET criteria, using the distal internal carotid diameter as the denominator. Multiphase CT imaging of the brain was performed following IV bolus contrast injection. Subsequent parametric perfusion maps were calculated using RAPID software. CONTRAST:  13mL ISOVUE-370 IOPAMIDOL (ISOVUE-370) INJECTION 76% COMPARISON:  Prior noncontrast CT from earlier same day. FINDINGS: CTA NECK FINDINGS  Aortic arch: Visualized aortic arch of normal caliber with  normal branch pattern. No hemodynamically significant stenosis about the origin of the great vessels. Visualized subclavian arteries widely patent. Right carotid system: Right common carotid artery patent from its origin to the bifurcation without stenosis. Minimal mixed plaque about the right bifurcation without stenosis. Short-segment mild stenosis of approximately 20% by NASCET criteria just distally within the proximal right ICA. Right ICA otherwise widely patent to the skull base without stenosis, dissection, or occlusion. Left carotid system: Left common carotid artery patent from its origin to the bifurcation without stenosis. No significant atheromatous narrowing about the left bifurcation. Left ICA widely patent from the bifurcation to the skull base without stenosis, dissection, or occlusion. Vertebral arteries: Both of the vertebral arteries arise from the subclavian arteries. Vertebral arteries widely patent within the neck without stenosis, dissection, or occlusion. Skeleton: No acute osseous finding. No discrete lytic or blastic osseous lesions. Mild to moderate cervical spondylolysis at C4-5 through C6-7. Prominent right-sided facet arthrosis noted at C3-4. 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 Soft tissues of the neck demonstrate no other acute finding. Upper chest: Layering fluid noted within the upper esophageal lumen. Visualized upper chest demonstrates no other acute finding. Partially visualized lungs are clear. Review of the MIP images confirms the above findings CTA HEAD FINDINGS Anterior circulation: Petrous ICAs widely patent bilaterally. Cavernous and supraclinoid segments widely patent without stenosis. 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 ICA termini well perfused distally. A1 segments patent bilaterally. Right A1 hypoplastic, likely accounting for the dominant left ICA as compared to the right. Normal anterior communicating artery.  Anterior cerebral arteries widely patent to their distal aspects without stenosis. Right M1 widely patent without stenosis. Normal right MCA bifurcation. Distal right MCA branches well perfused. Left M1 patent proximally. 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 Upon review of prior MRI from 2010, this is likely chronic in nature. Stenosis measures approximately 3 mm in length. Left M1 patent distally. Normal left MCA bifurcation. Left MCA branches are opacified distally, although overall attenuated as compared to the right due to the severe M1 stenosis. Posterior circulation: Vertebral arteries patent to the vertebrobasilar junction without stenosis. Posterior inferior cerebral arteries patent bilaterally. Proximal and mid basilar artery diminutive but widely patent without stenosis. Persistent left-sided trigeminal artery supplies the distal basilar artery which is more robust in caliber. Superior cerebral arteries patent bilaterally. Both of the posterior cerebral arteries primarily supplied via the basilar and are well perfused to their distal aspects. Venous sinuses: Grossly patent, although limited in assessment due to arterial timing the contrast bolus. Anatomic variants: Persistent left trigeminal artery. No intracranial aneurysm. Delayed phase: Not performed. Review of the MIP images confirms the above findings CT Brain Perfusion Findings: CBF (<30%) Volume: 79mL Perfusion (Tmax>6.0s) volume: 162mL Mismatch Volume: 112mL Infarction Location:No core infarct identified by CT perfusion. Large perfusion mismatch throughout the left MCA territory due to the severe left M1 stenosis. IMPRESSION: 1. Negative CTA for large vessel occlusion. No core infarct evident by CT perfusion. 2. Short-segment severe mid left M1 stenosis, likely similar as compared to previous MRI from 11/19/2008. Associated delayed  perfusion throughout the left MCA territory distally. 3. No other hemodynamically significant or correctable stenosis within the major arterial vasculature of the head and neck. 4. Persistent left trigeminal artery. Finding discussed with Dr. Leonel Ramsay by telephone at approximately  9:45 p.m. on 08/30/2018. Electronically Signed   By: Jeannine Boga M.D.   On: 08/30/2018 22:59   Mr Brain Wo Contrast  Result Date: 08/30/2018 CLINICAL DATA:  63 y/o  M; right-sided weakness. EXAM: MRI HEAD WITHOUT CONTRAST TECHNIQUE: Multiplanar, multiecho pulse sequences of the brain and surrounding structures were obtained without intravenous contrast. COMPARISON:  08/30/2018 CT head, CT perfusion head, and CTA head. FINDINGS: Brain: Multiple foci of reduced diffusion are present within the left caudate body, left mid and posterior corona radiata, left posterior limb of internal capsule extending into left putamen, as well as several additional small foci of the temporoparietal periventricular white matter and the left frontal operculum compatible with acute/early subacute infarction. No associated hemorrhage or mass effect. Infarcts are T2 FLAIR hyperintense. No acute hemorrhage. No extra-axial collection, hydrocephalus, mass effect, or herniation. Small chronic infarcts in the right parietal cortex and right occipital periventricular white matter. Mild chronic microvascular ischemic changes of the brain. Mild brain parenchymal volume loss. Vascular: Please refer to CT angiogram of the head and neck. Skull and upper cervical spine: Normal marrow signal. Sinuses/Orbits: Right frontal, ethmoid, and maxillary sinus partial opacification and fluid level. No abnormal signal of the mastoid air cells. Orbits are unremarkable. Other: None. IMPRESSION: 1. Multiple small foci of acute/early subacute infarction are present in the left MCA distribution concentrated in left basal ganglia inclusive of the corona radiata and posterior  limb of internal capsule. No hemorrhage or mass effect. 2. Mild chronic microvascular ischemic changes and volume loss of the brain. Small chronic infarcts in right parietal and occipital lobes. 3. Right frontal, ethmoid, and maxillary sinus disease is a right middle meatus obstructive pattern, direct visualization recommended. These results were called by telephone at the time of interpretation on 08/30/2018 at 10:46 pm to Dr. Leonel Ramsay, who verbally acknowledged these results. Electronically Signed   By: Kristine Garbe M.D.   On: 08/30/2018 22:48   Ct Cerebral Perfusion W Contrast  Result Date: 08/30/2018 CLINICAL DATA:  Initial evaluation for acute speech difficulty, right facial weakness. EXAM: CT ANGIOGRAPHY HEAD AND NECK CT PERFUSION BRAIN TECHNIQUE: Multidetector CT imaging of the head and neck was performed using the standard protocol during bolus administration of intravenous contrast. Multiplanar CT image reconstructions and MIPs were obtained to evaluate the vascular anatomy. Carotid stenosis measurements (when applicable) are obtained utilizing NASCET criteria, using the distal internal carotid diameter as the denominator. Multiphase CT imaging of the brain was performed following IV bolus contrast injection. Subsequent parametric perfusion maps were calculated using RAPID software. CONTRAST:  134mL ISOVUE-370 IOPAMIDOL (ISOVUE-370) INJECTION 76% COMPARISON:  Prior noncontrast CT from earlier same day. FINDINGS: CTA NECK FINDINGS Aortic arch: Visualized aortic arch of normal caliber with normal branch pattern. No hemodynamically significant stenosis about the origin of the great vessels. Visualized subclavian arteries widely patent. Right carotid system: Right common carotid artery patent from its origin to the bifurcation without stenosis. Minimal mixed plaque about the right bifurcation without stenosis. Short-segment mild stenosis of approximately 20% by NASCET criteria just distally  within the proximal right ICA. Right ICA otherwise widely patent to the skull base without stenosis, dissection, or occlusion. Left carotid system: Left common carotid artery patent from its origin to the bifurcation without stenosis. No significant atheromatous narrowing about the left bifurcation. Left ICA widely patent from the bifurcation to the skull base without stenosis, dissection, or occlusion. Vertebral arteries: Both of the vertebral arteries arise from the subclavian arteries. Vertebral arteries widely patent within the  neck without stenosis, dissection, or occlusion. Skeleton: No acute osseous finding. No discrete lytic or blastic osseous lesions. Mild to moderate cervical spondylolysis at C4-5 through C6-7. Prominent right-sided facet arthrosis noted at C3-4. 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 Soft tissues of the neck demonstrate no other acute finding. Upper chest: Layering fluid noted within the upper esophageal lumen. Visualized upper chest demonstrates no other acute finding. Partially visualized lungs are clear. Review of the MIP images confirms the above findings CTA HEAD FINDINGS Anterior circulation: Petrous ICAs widely patent bilaterally. Cavernous and supraclinoid segments widely patent without stenosis. 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 ICA termini well perfused distally. A1 segments patent bilaterally. Right A1 hypoplastic, likely accounting for the dominant left ICA as compared to the right. Normal anterior communicating artery. Anterior cerebral arteries widely patent to their distal aspects without stenosis. Right M1 widely patent without stenosis. Normal right MCA bifurcation. Distal right MCA branches well perfused. Left M1 patent proximally. 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 Upon review of prior MRI from 2010,  this is likely chronic in nature. Stenosis measures approximately 3 mm in length. Left M1 patent distally. Normal left MCA bifurcation. Left MCA branches are opacified distally, although overall attenuated as compared to the right due to the severe M1 stenosis. Posterior circulation: Vertebral arteries patent to the vertebrobasilar junction without stenosis. Posterior inferior cerebral arteries patent bilaterally. Proximal and mid basilar artery diminutive but widely patent without stenosis. Persistent left-sided trigeminal artery supplies the distal basilar artery which is more robust in caliber. Superior cerebral arteries patent bilaterally. Both of the posterior cerebral arteries primarily supplied via the basilar and are well perfused to their distal aspects. Venous sinuses: Grossly patent, although limited in assessment due to arterial timing the contrast bolus. Anatomic variants: Persistent left trigeminal artery. No intracranial aneurysm. Delayed phase: Not performed. Review of the MIP images confirms the above findings CT Brain Perfusion Findings: CBF (<30%) Volume: 55mL Perfusion (Tmax>6.0s) volume: 134mL Mismatch Volume: 119mL Infarction Location:No core infarct identified by CT perfusion. Large perfusion mismatch throughout the left MCA territory due to the severe left M1 stenosis. IMPRESSION: 1. Negative CTA for large vessel occlusion. No core infarct evident by CT perfusion. 2. Short-segment severe mid left M1 stenosis, likely similar as compared to previous MRI from 11/19/2008. Associated delayed perfusion throughout the left MCA territory distally. 3. No other hemodynamically significant or correctable stenosis within the major arterial vasculature of the head and neck. 4. Persistent left trigeminal artery. Finding discussed with Dr. Leonel Ramsay by telephone at approximately 9:45 p.m. on 08/30/2018. Electronically Signed   By: Jeannine Boga M.D.   On: 08/30/2018 22:59   Vas [place] Transcranial  Doppler W Bubbles  Result Date: 09/01/2018  Transcranial Doppler with Bubble Indications: Stroke. Performing Technologist: Maudry Mayhew MHA, RDMS, RVT, RDCS  Examination Guidelines: A complete evaluation includes B-mode imaging, spectral Doppler, color Doppler, and power Doppler as needed of all accessible portions of each vessel. Bilateral testing is considered an integral part of a complete examination. Limited examinations for reoccurring indications may be performed as noted.  Summary:  A vascular evaluation was performed. The right middle cerebral artery was studied. An IV was inserted into the patient's left forearm. Verbal informed consent was obtained.  There is no evidence of HITS (high intensity transient signals) at rest or with Valsalva. Therefore, there is no evidence of  PFO (patent foramen ovale). *See table(s) above for measurements and observations.    Preliminary    Vas [place] Lower Extremity Venous (dvt)  Result Date: 09/01/2018  Lower Venous Study Indications: Stroke.  Performing Technologist: Maudry Mayhew MHA, RDMS, RVT, RDCS  Examination Guidelines: A complete evaluation includes B-mode imaging, spectral Doppler, color Doppler, and power Doppler as needed of all accessible portions of each vessel. Bilateral testing is considered an integral part of a complete examination. Limited examinations for reoccurring indications may be performed as noted.  Right Venous Findings: +---------+---------------+---------+-----------+----------+--------------+          CompressibilityPhasicitySpontaneityPropertiesSummary        +---------+---------------+---------+-----------+----------+--------------+ CFV      Full           Yes      Yes                                 +---------+---------------+---------+-----------+----------+--------------+ SFJ      Full                                                         +---------+---------------+---------+-----------+----------+--------------+ FV Prox  Full                                                        +---------+---------------+---------+-----------+----------+--------------+ FV Mid   Full                                                        +---------+---------------+---------+-----------+----------+--------------+ FV DistalFull                                                        +---------+---------------+---------+-----------+----------+--------------+ PFV      Full                                                        +---------+---------------+---------+-----------+----------+--------------+ POP      Full                    Yes                  Rouleaux flow  +---------+---------------+---------+-----------+----------+--------------+ PTV      Full                                                        +---------+---------------+---------+-----------+----------+--------------+ PERO  Not visualized +---------+---------------+---------+-----------+----------+--------------+  Left Venous Findings: +---------+---------------+---------+-----------+----------+-------+          CompressibilityPhasicitySpontaneityPropertiesSummary +---------+---------------+---------+-----------+----------+-------+ CFV      Full           No       Yes                          +---------+---------------+---------+-----------+----------+-------+ SFJ      Full                                                 +---------+---------------+---------+-----------+----------+-------+ FV Prox  Full                                                 +---------+---------------+---------+-----------+----------+-------+ FV Mid   Full                                                 +---------+---------------+---------+-----------+----------+-------+ FV DistalFull                                                  +---------+---------------+---------+-----------+----------+-------+ PFV      Full                                                 +---------+---------------+---------+-----------+----------+-------+ POP      Full                    Yes                          +---------+---------------+---------+-----------+----------+-------+ PTV      Full                                                 +---------+---------------+---------+-----------+----------+-------+ PERO     Full                                                 +---------+---------------+---------+-----------+----------+-------+    Summary: Right: There is no evidence of deep vein thrombosis in the lower extremity. However, portions of this examination were limited- see technologist comments above. No cystic structure found in the popliteal fossa. Left: There is no evidence of deep vein thrombosis in the lower extremity. However, portions of this examination were limited- see technologist comments above. No cystic structure found in the popliteal fossa.  Pulsatile lower extremity venous flow is suggestive of possible elevated right-sided heart pressure. *See table(s) above for measurements and observations. Electronically signed by Monica Martinez MD  on 09/01/2018 at 3:44:34 PM.    Final    Transthoracic Echocardiogram  Normal ejection fraction 55-60%. No left ventricular hypertrophy or clot.  TEE - pending    PHYSICAL EXAM  Temp:  [98.1 F (36.7 C)-99.6 F (37.6 C)] 98.9 F (37.2 C) (02/09 0838) Pulse Rate:  [60-67] 63 (02/09 0838) Resp:  [16-18] 18 (02/09 0838) BP: (121-156)/(75-87) 121/78 (02/09 0838) SpO2:  [95 %-97 %] 95 % (02/09 0838)  General - Well nourished, well developed, in no apparent distress.  Ophthalmologic - fundi not visualized due to noncooperation.  Cardiovascular - Regular rate and rhythm.  Mental Status -  Level of arousal and orientation to  place, and person were intact, but not orientated to time. Able to repeat simple sentences but not able to name. Following all simple commands, but paucity of speech with moderate dysarthria. Perseveration on numbers    Cranial Nerves II - XII - II - Visual field intact OU. III, IV, VI - Extraocular movements intact. V - Facial sensation decreased on the right. VII - right facial droop. VIII - Hearing & vestibular intact bilaterally. X - Palate elevates symmetrically, moderate dysarthria with hypophonia. XI - Chin turning & shoulder shrug intact bilaterally. XII - Tongue protrusion intact.  Motor Strength - The patient's strength was normal in LUE and LLE, but right hemiplegia 0/5 RUE and 1/5 [name].  Bulk was normal and fasciculations were absent.   Motor Tone - Muscle tone was assessed at the neck and appendages and was normal.  Reflexes - The patient's reflexes were symmetrical in all extremities and he had right babinski positive.   Sensory - Light touch, temperature/pinprick were assessed and were decreased on the right  Coordination - The patient had normal movements in the left hand with no ataxia or dysmetria.  Tremor was absent.  Gait and Station - deferred.   ASSESSMENT/PLAN 63 year old male who is very fit/healthy at baseline. MRI shows new scattered left acute ischemic strokes. CTA shows L M1 near occulsion. He arrived greater than 24h from time since last well, therefore no acute stroke interventions done. 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 Stroke wk up underway. Pt and SO deny any palpations. When pts dtrs arrive, they both explain that in 2016 after tricep repair surgery they noted a possible arrhythmia and placed him on holter monitor for 30d, but was unrevealing.    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 patient has had neurological  worsening. Extension of deficits on 09/01/18.  Resultant right side weakness, dysarthria and mixed aphasia  CT head patchy areas in the left hemisphere MRI head -  MRA head - scattered L MCA infarcts, mostly concentrated in L BG  CTA H&N- severe stenosis versus occlusion of the left M1  CT perfusion no infarct core but large penumbra  2D Echo -EF 55 to 60%, normal size of left atrium  TCD bubble study no PFO  LE venous Doppler negative for DVT  Recommend TEE and loop recorder to evaluate cardioembolic source  LDL - 785  HgbA1c - 6.2  UDS - neg  VTE prophylaxis - lovenox  prior to admission was taking 81mg  ASA, now on DAPT after Plavix load of 300mg   Patient counseled to be compliant with his antithrombotic medications  Ongoing aggressive stroke risk factor management  Therapy recommendations:  CIR  Disposition:  Pending  History of heart palpitation  Patient seem  to have one episode of racing heart in 2016  48-hour Holter monitoring 06/15/2015 unrevealing  Recommend loop recorder for further evaluation given current embolic stroke  Hypertension  Stable  Permissive hypertension (OK if <180/105) but gradually normalize in 3-5 days  Avoid hypotension . 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 Prozac started 08/31/2018 as this has been shown to also improve motor deficits in stroke pts.   Hospital day # 3   Rosalin Hawking, MD PhD Stroke Neurology 09/03/2018 5:04 PM    To contact Stroke Continuity provider, please refer to [email or web address] After hours, contact General Neurology

## 2018-09-04 ENCOUNTER — Encounter (HOSPITAL_COMMUNITY)
Admission: EM | Disposition: A | Discharge status: Rehabilitation Facility | Payer: Self-pay | Source: Home / Self Care | Attending: Internal Medicine

## 2018-09-04 ENCOUNTER — Encounter (HOSPITAL_COMMUNITY): Payer: Self-pay | Admitting: *Deleted

## 2018-09-04 ENCOUNTER — Inpatient Hospital Stay (HOSPITAL_COMMUNITY): Payer: BLUE CROSS/BLUE SHIELD

## 2018-09-04 DIAGNOSIS — I6389 Other cerebral infarction: Secondary | ICD-10-CM

## 2018-09-04 HISTORY — PX: TEE WITHOUT CARDIOVERSION: SHX5443

## 2018-09-04 HISTORY — PX: LOOP RECORDER INSERTION: EP1214

## 2018-09-04 LAB — CREATININE, SERUM
Creatinine, Ser: 0.91 mg/dL (ref 0.61–1.24)
GFR calc Af Amer: >60 mL/min (ref >60)
GFR calc non Af Amer: >60 mL/min (ref >60)

## 2018-09-04 LAB — CBC
HCT: 49.5 % (ref 39.0–52.0)
Hemoglobin: 16.2 g/dL (ref 13.0–17.0)
MCH: 29.3 pg (ref 26.0–34.0)
MCHC: 32.7 g/dL (ref 30.0–36.0)
MCV: 89.5 fL (ref 80.0–100.0)
PLATELETS: 349 10*3/uL (ref 150–400)
RBC: 5.53 MIL/uL (ref 4.22–5.81)
RDW: 12.5 % (ref 11.5–15.5)
WBC: 9.9 10*3/uL (ref 4.0–10.5)
nRBC: 0 % (ref 0.0–0.2)

## 2018-09-04 SURGERY — LOOP RECORDER INSERTION

## 2018-09-04 SURGERY — ECHOCARDIOGRAM, TRANSESOPHAGEAL
Anesthesia: Moderate Sedation

## 2018-09-04 MED ORDER — LIDOCAINE-EPINEPHRINE 1 %-1:100000 IJ SOLN
INTRAMUSCULAR | Status: AC
  Filled 2018-09-04: qty 1

## 2018-09-04 MED ORDER — ENOXAPARIN SODIUM 40 MG/0.4ML ~~LOC~~ SOLN: 40.0000 mg | SUBCUTANEOUS | Status: DC

## 2018-09-04 MED ORDER — MIDAZOLAM HCL (PF) 5 MG/ML IJ SOLN
INTRAMUSCULAR | Status: AC
  Filled 2018-09-04: qty 2

## 2018-09-04 MED ORDER — FENTANYL CITRATE (PF) 100 MCG/2ML IJ SOLN
INTRAMUSCULAR | Status: DC | PRN
  Administered 2018-09-04: 25 ug via INTRAVENOUS

## 2018-09-04 MED ORDER — CLOPIDOGREL BISULFATE 75 MG PO TABS: 75.0000 mg | ORAL_TABLET | Freq: Every day | ORAL | Status: DC

## 2018-09-04 MED ORDER — ONDANSETRON HCL 4 MG/2ML IJ SOLN: 4.0000 mg | Freq: Four times a day (QID) | INTRAMUSCULAR | Status: DC | PRN

## 2018-09-04 MED ORDER — SODIUM CHLORIDE 0.9 % IV SOLN
INTRAVENOUS | Status: AC | PRN
  Administered 2018-09-04: 500 mL via INTRAVENOUS

## 2018-09-04 MED ORDER — ASPIRIN EC 325 MG PO TBEC
325.0000 mg | DELAYED_RELEASE_TABLET | Freq: Every day | ORAL | Status: DC
  Administered 2018-09-05: 325 mg via ORAL
  Filled 2018-09-04: qty 1

## 2018-09-04 MED ORDER — LIDOCAINE-EPINEPHRINE 1 %-1:100000 IJ SOLN
INTRAMUSCULAR | Status: DC | PRN
  Administered 2018-09-04: 30 mL

## 2018-09-04 MED ORDER — FENTANYL CITRATE (PF) 100 MCG/2ML IJ SOLN
INTRAMUSCULAR | Status: AC
  Filled 2018-09-04: qty 2

## 2018-09-04 MED ORDER — ACETAMINOPHEN 325 MG PO TABS: 325.0000 mg | ORAL_TABLET | ORAL | Status: DC | PRN

## 2018-09-04 MED ORDER — MIDAZOLAM HCL (PF) 10 MG/2ML IJ SOLN
INTRAMUSCULAR | Status: DC | PRN
  Administered 2018-09-04: 1 mg via INTRAVENOUS
  Administered 2018-09-04: 2 mg via INTRAVENOUS

## 2018-09-04 MED ORDER — BUTAMBEN-TETRACAINE-BENZOCAINE 2-2-14 % EX AERO
INHALATION_SPRAY | CUTANEOUS | Status: DC | PRN
  Administered 2018-09-04: 2 via TOPICAL

## 2018-09-04 MED ORDER — ATORVASTATIN CALCIUM 40 MG PO TABS: 40.0000 mg | ORAL_TABLET | Freq: Every day | ORAL | Status: DC

## 2018-09-04 SURGICAL SUPPLY — 2 items
LOOP REVEAL LINQSYS (Prosthesis & Implant Heart) ×2 IMPLANT
PACK LOOP INSERTION (CUSTOM PROCEDURE TRAY) ×2 IMPLANT

## 2018-09-04 NOTE — Interval H&P Note (Signed)
History and Physical Interval Note:  09/04/2018 3:06 PM  Edward Glass  has presented today for surgery, with the diagnosis of stroke  The various methods of treatment have been discussed with the patient and family. After consideration of risks, benefits and other options for treatment, the patient has consented to  Procedure(s): TRANSESOPHAGEAL ECHOCARDIOGRAM (TEE) (N/A) as a surgical intervention .  The patient's history has been reviewed, patient examined, no change in status, stable for surgery.  I have reviewed the patient's chart and labs.  Questions were answered to the patient's satisfaction.     [employer]

## 2018-09-04 NOTE — Discharge Instructions (Signed)
Implant site/wound care instructions °Keep incision clean and dry for 3 days. °You can remove outer dressing tomorrow. °Leave steri-strips (little pieces of tape) on until seen in the office for wound check appointment. °Call the office ([phone]) for redness, drainage, swelling, or fever. ° °

## 2018-09-04 NOTE — Consult Note (Addendum)
ELECTROPHYSIOLOGY CONSULT NOTE  Patient ID: Edward Glass MRN: [id number], DOB/AGE: 64/26/1957   Admit date: 08/30/2018 Date of Consult: 09/04/2018  Primary Physician: Lurline Hare, MD Primary Cardiologist: Dr. Debara Pickett (2017) Reason for Consultation: Cryptogenic stroke ; recommendations regarding Implantable Loop Recorder, requested by Dr. 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  I n review of old record h/o steroid, GH, and T3 use was associated with weight lifting remotely.  T Imaging demonstrated left MCA patchy secondary to left M1 occlusion, embolic pattern, etiology unclear. patient has had neurological worsening. Extension of deficits on 09/01/18..  he has undergone workup for stroke including echocardiogram and carotid angio.  The patient has been monitored on telemetry which has demonstrated sinus rhythm with no arrhythmias.  Inpatient stroke work-up is to be completed with a TEE.   Echocardiogram this admission demonstrated   IMPRESSIONS  1. The left ventricle has normal systolic function of 44-03%. The cavity size was normal. There is no increased left ventricular wall thickness. Echo evidence of impaired diastolic relaxation.  2. The right ventricle has normal systolic function. The cavity was normal. There is no increase in right ventricular wall thickness.  3. The mitral valve is normal in structure. No evidence of mitral valve stenosis. No significant mitral regurgitation.  4. The tricuspid valve is normal in structure.  5. The aortic valve is tricuspid There is mild calcification of the aortic valve. No aortic stenosis.  6. The pulmonic valve was normal in structure.  7. The aortic root and ascending aorta are normal in size and structure.  8. No evidence of left ventricular regional wall motion abnormalities.  9. The  inferior vena cava is normal in size with greater than 50% respiratory variability. 10. No complete TR doppler jet so unable to estimate PA systolic pressure.   Lab work is reviewed.  Prior to admission, the patient denies chest pain, shortness of breath, dizziness, or syncope.   He has h/o of cardiac and EP evaluation back in 2016/2017 for pre-op eval, and palpitations.  He had EKGs with findings of pre-excitation noted, Dr. Curt Bears felt had a possible anteroseptal pathway. His heart rate on the EKG is 57. An EKG done in the office today showed no evidence of preexcitation and a heart rate of 72. It is possible that he does not have a robust pathway and therefore would not be able to sustain tachycardia, a 48 hour monitor was done with no arrhythmias  The patient is able to say yes and no, he does not recall the prior w/u noted above, though his daughter at bedside does, he says no to any ongoing or recent palpitations.  His daughter and friend at bedside mention Sunday prior to his admission was at the gym, talking with the friend when he suddenly became pale, weak, seemed to gram his chest/clentch his fist and nearly fainted  though helped to the floor.  He woke quickly though unable to stand under his own power with some observation of LEFT sided/leg weakness, EMS was called and by then felt well again, with no abnormalities, he did though get transported, given IVF on the way to the ER and discharged (noting no mention of any particular L sided weakness being reported at that visit)  He is planned for CIR at discharge today    Past Medical History:  Diagnosis Date  . Atypical chest pain 08/10/2013  . Elevated aspartate aminotransferase level 06/16/2015  . Excessive salivation 06/16/2015  . H/O nutritional disorder 06/16/2015  . 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 Screening for prostate cancer 08/21/2014  . 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 HERNIA REPAIR  2006  . left tricep surgery       Medications Prior to Admission  Medication Sig Dispense Refill Last Dose  . alfuzosin (UROXATRAL) 10 MG 24 hr tablet Take 10 mg by mouth 3 (three) times a week.   Past Week at Unknown time  . aspirin 81 MG tablet Take 81 mg by mouth daily.   08/29/2018 at 0800  . MILK THISTLE PO Take 2 tablets by mouth daily.   08/29/2018 at Unknown time  . Misc Natural Products (SAW PALMETTO) CAPS Take 2-4 capsules by mouth daily.   08/29/2018 at Unknown time  . multivitamin (ONE-A-DAY MEN'S) TABS tablet Take 1 tablet by mouth daily.   08/29/2018 at Unknown time  . sildenafil (REVATIO) 20 MG tablet 2-5 pills as needed for ED symptoms (Patient not taking: Reported on 08/30/2018) 50 tablet 5 Not Taking at Unknown time    Inpatient Medications:  . alfuzosin  10 mg Oral Once per day on Mon Wed Fri  . aspirin EC  81 mg Oral Daily  . atorvastatin  40 mg Oral q1800  . clopidogrel  75 mg Oral Daily  . enoxaparin (LOVENOX) injection  40 mg Subcutaneous Q24H  . multivitamin with minerals  1 tablet Oral Daily    Allergies: No Known Allergies  Social History   Socioeconomic History  . Marital status: Married    Spouse name: Not on file  . Number of children: Not on file  . Years of education: Not on file  . Highest education level: Not on file  Occupational History  . Not on file  Social Needs  . Financial resource strain: Not on file  . Food insecurity:    Worry: Not on file    Inability: Not on file  . Transportation needs:    Medical: Not on file    Non-medical: Not on file  Tobacco Use  . Smoking status: Never Smoker  . Smokeless tobacco: Never Used  Substance and Sexual Activity  . Alcohol use: No  . Drug use: No  . Sexual activity: Not on file  Lifestyle  . Physical activity:    Days per week: Not on file    Minutes per session: Not on file  . Stress: Not on file  Relationships  . Social  connections:    Talks on phone: Not on file    Gets together: Not on file    Attends religious service: Not on file    Active member of club or organization: Not on file    Attends meetings of clubs or organizations: Not on file    Relationship status: Not on file  . Intimate partner violence:    Fear of current or ex partner: Not on file    Emotionally abused: Not on file    Physically abused: Not on file    Forced sexual activity: Not on file  Other Topics Concern  . Not on file  Social History Narrative  . Not on file     Family History  Problem Relation Age of Onset  . Kidney failure Mother   .  Cancer Mother   . Prostate cancer Father   . Cancer Father   . Diabetes Father   . Colon cancer Neg Hx   . Esophageal cancer Neg Hx   . Rectal cancer Neg Hx   . Stomach cancer Neg Hx       Review of Systems: All other systems reviewed and are otherwise negative except as noted above.  Physical Exam: Vitals:   09/03/18 1534 09/03/18 1951 09/04/18 0008 09/04/18 0330  BP: 131/72 132/76 124/73 120/78  Pulse: (!) 58 66 60 73  Resp: 18 16  18   Temp: 99.3 F (37.4 C) 98.7 F (37.1 C) 98.9 F (37.2 C) 98.8 F (37.1 C)  TempSrc: Oral Oral Oral Oral  SpO2: 97% 95% 95% 96%  Weight:      Height:        GEN- The patient is well appearing, alert, full orientation is hard to establish, he tells me yes to his name, and that he had a stroke.Marland Kitchen   Head- normocephalic, atraumatic Eyes-  Sclera clear, conjunctiva pink Ears- hearing intact Oropharynx- clear Neck- supple Lungs- CTA b/l, normal work of breathing Heart- RRR, no murmurs, rubs or gallops  GI- soft, NT, ND Extremities- no clubbing, cyanosis, or edema MS- no significant deformity or atrophy Skin- no rash or lesion Psych- difficult to establish with aphasia, though seems pleasent   Labs:   Lab Results  Component Value Date   WBC 6.5 08/30/2018   HGB 15.4 08/30/2018   HCT 48.5 08/30/2018   MCV 91.9 08/30/2018    PLT 269 08/30/2018    Recent Labs  Lab 08/30/18 1546  NA 140  K 3.7  CL 105  CO2 23  BUN 17  CREATININE 0.91  CALCIUM 9.4  PROT 7.1  BILITOT 0.4  ALKPHOS 65  ALT 35  AST 40  GLUCOSE 100*   No results found for: CKTOTAL, CKMB, CKMBINDEX, TROPONINI Lab Results  Component Value Date   CHOL 173 08/31/2018   CHOL 159 08/15/2013   Lab Results  Component Value Date   HDL 52 08/31/2018   HDL 63 08/15/2013   Lab Results  Component Value Date   LDLCALC 104 (H) 08/31/2018   LDLCALC 86 08/15/2013   Lab Results  Component Value Date   TRIG 86 08/31/2018   TRIG 52 08/15/2013   Lab Results  Component Value Date   CHOLHDL 3.3 08/31/2018   No results found for: LDLDIRECT  No results found for: DDIMER   Radiology/Studies:   Ct Angio Head W Or Wo Contrast Result Date: 08/30/2018 CLINICAL DATA:  Initial evaluation for acute speech difficulty, right facial weakness. EXAM: CT ANGIOGRAPHY HEAD AND NECK CT PERFUSION BRAIN TECHNIQUE: Multidetector CT imaging of the head and neck was performed using the standard protocol during bolus administration of intravenous contrast. Multiplanar CT image reconstructions and MIPs were obtained to evaluate the vascular anatomy. Carotid stenosis measurements (when applicable) are obtained utilizing NASCET criteria, using the distal internal carotid diameter as the denominator. Multiphase CT imaging of the brain was performed following IV bolus contrast injection. Subsequent parametric perfusion maps were calculated using RAPID software. CONTRAST:  138mL ISOVUE-370 IOPAMIDOL (ISOVUE-370) INJECTION 76% COMPARISON:  Prior noncontrast CT from earlier same day. FINDINGS: CTA NECK FINDINGS Aortic arch: Visualized aortic arch of normal caliber with normal branch pattern. No hemodynamically significant stenosis about the origin of the great vessels. Visualized subclavian arteries widely patent. Right carotid system: Right common carotid artery patent from its  origin to the  bifurcation without stenosis. Minimal mixed plaque about the right bifurcation without stenosis. Short-segment mild stenosis of approximately 20% by NASCET criteria just distally within the proximal right ICA. Right ICA otherwise widely patent to the skull base without stenosis, dissection, or occlusion. Left carotid system: Left common carotid artery patent from its origin to the bifurcation without stenosis. No significant atheromatous narrowing about the left bifurcation. Left ICA widely patent from the bifurcation to the skull base without stenosis, dissection, or occlusion. Vertebral arteries: Both of the vertebral arteries arise from the subclavian arteries. Vertebral arteries widely patent within the neck without stenosis, dissection, or occlusion. Skeleton: No acute osseous finding. No discrete lytic or blastic osseous lesions. Mild to moderate cervical spondylolysis at C4-5 through C6-7. Prominent right-sided facet arthrosis noted at C3-4. 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 Soft tissues of the neck demonstrate no other acute finding. Upper chest: Layering fluid noted within the upper esophageal lumen. Visualized upper chest demonstrates no other acute finding. Partially visualized lungs are clear. Review of the MIP images confirms the above findings CTA HEAD FINDINGS Anterior circulation: Petrous ICAs widely patent bilaterally. Cavernous and supraclinoid segments widely patent without stenosis. 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 ICA termini well perfused distally. A1 segments patent bilaterally. Right A1 hypoplastic, likely accounting for the dominant left ICA as compared to the right. Normal anterior communicating artery. Anterior cerebral arteries widely patent to their distal aspects without stenosis. Right M1 widely patent without stenosis. Normal right MCA bifurcation. Distal right MCA branches well perfused. Left M1 patent  proximally. 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 Upon review of prior MRI from 2010, this is likely chronic in nature. Stenosis measures approximately 3 mm in length. Left M1 patent distally. Normal left MCA bifurcation. Left MCA branches are opacified distally, although overall attenuated as compared to the right due to the severe M1 stenosis. Posterior circulation: Vertebral arteries patent to the vertebrobasilar junction without stenosis. Posterior inferior cerebral arteries patent bilaterally. Proximal and mid basilar artery diminutive but widely patent without stenosis. Persistent left-sided trigeminal artery supplies the distal basilar artery which is more robust in caliber. Superior cerebral arteries patent bilaterally. Both of the posterior cerebral arteries primarily supplied via the basilar and are well perfused to their distal aspects. Venous sinuses: Grossly patent, although limited in assessment due to arterial timing the contrast bolus. Anatomic variants: Persistent left trigeminal artery. No intracranial aneurysm. Delayed phase: Not performed. Review of the MIP images confirms the above findings CT Brain Perfusion Findings: CBF (<30%) Volume: 11mL Perfusion (Tmax>6.0s) volume: 177mL Mismatch Volume: 146mL Infarction Location:No core infarct identified by CT perfusion. Large perfusion mismatch throughout the left MCA territory due to the severe left M1 stenosis. IMPRESSION: 1. Negative CTA for large vessel occlusion. No core infarct evident by CT perfusion. 2. Short-segment severe mid left M1 stenosis, likely similar as compared to previous MRI from 11/19/2008. Associated delayed perfusion throughout the left MCA territory distally. 3. No other hemodynamically significant or correctable stenosis within the major arterial vasculature of the head and neck. 4. Persistent left trigeminal artery.  Finding discussed with Dr. Leonel Ramsay by telephone at approximately 9:45 p.m. on 08/30/2018. Electronically Signed   By: Jeannine Boga M.D.   On: 08/30/2018 22:59    Dg Chest 2 View Result Date: 08/30/2018 CLINICAL DATA:  63 year old male with slurred speech and cough today. EXAM: CHEST -  2 VIEW COMPARISON:  08/27/2018 chest radiographs and earlier. FINDINGS: Lower lung volumes on both views. No pneumothorax, pulmonary edema, pleural effusion or consolidation. Mild basilar crowding of markings. Stable cardiac size at the upper limits of normal. Other mediastinal contours are within normal limits. Visualized tracheal air column is within normal limits. No acute osseous abnormality identified. Negative visible bowel gas pattern. Previous ventral abdominal hernia repair with mesh. IMPRESSION: Lower lung volumes, otherwise no acute cardiopulmonary abnormality. Electronically Signed   By: Genevie Ann M.D.   On: 08/30/2018 17:15      Ct Head Wo Contrast Result Date: 09/01/2018 CLINICAL DATA:  Follow-up stroke.  Increased weakness on the right. EXAM: CT HEAD WITHOUT CONTRAST TECHNIQUE: Contiguous axial images were obtained from the base of the skull through the vertex without intravenous contrast. COMPARISON:  Brain MRI from 2 days ago FINDINGS: Brain: Acute infarct in the left deep watershed/lateral lenticulostriate distribution which has a more confluent low-density appearance than seen on prior diffusion imaging. No new territory involvement. No hemorrhagic conversion. Small remote right parietal cortex infarct. No hydrocephalus or masslike finding Vascular: Atherosclerotic calcification Skull: No acute finding Sinuses/Orbits: Right frontal maxillary and ethmoid sinusitis, also seen on prior study. IMPRESSION: More confluent acute infarct in the left basal ganglia and corona radiata when compared to diffusion imaging 2 days ago. No new distribution infarct or hemorrhagic conversion. Electronically Signed    By: Monte Fantasia M.D.   On: 09/01/2018 09:17     Ct Head Wo Contrast Result Date: 08/30/2018 CLINICAL DATA:  63 year old male with abnormal speech onset today. EXAM: CT HEAD WITHOUT CONTRAST TECHNIQUE: Contiguous axial images were obtained from the base of the skull through the vertex without intravenous contrast. COMPARISON:  Brain MRI 11/19/2008. FINDINGS: Brain: A small area of right parietal lobe cortical encephalomalacia is redemonstrated on series 3, image 22. Cerebral volume has not significantly changed. New abnormal hypodensity in the left posterior limb internal capsule and/or lentiform (series 3, image 14). New hypodensity also in the left caudate and anterior lentiform on image 17. No acute intracranial hemorrhage identified. No midline shift, mass effect, or evidence of intracranial mass lesion. No ventriculomegaly. No acute cortically based infarct identified. Incidental tentorial dural calcifications. Vascular: No suspicious intracranial vascular hyperdensity. Skull: Negative. Sinuses/Orbits: Right frontal ethmoid and maxillary sinus disease with fluid levels is new since 2010. The right sphenoid and left paranasal sinuses are spared. Tympanic cavities and mastoids are clear. Other: Mildly Disconjugate gaze, otherwise negative orbits. Visualized scalp soft tissues are within normal limits. IMPRESSION: 1. 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 2. Right OMC pattern paranasal sinusitis. Electronically Signed   By: Genevie Ann M.D.   On: 08/30/2018 17:39      Mr Brain Wo Contrast Result Date: 08/30/2018 CLINICAL DATA:  63 y/o  M; right-sided weakness. EXAM: MRI HEAD WITHOUT CONTRAST TECHNIQUE: Multiplanar, multiecho pulse sequences of the brain and surrounding structures were obtained without intravenous contrast. COMPARISON:  08/30/2018 CT head, CT perfusion head, and CTA head. FINDINGS: Brain: Multiple foci of reduced diffusion are present within the  left caudate body, left mid and posterior corona radiata, left posterior limb of internal capsule extending into left putamen, as well as several additional small foci of the temporoparietal periventricular white matter and the left frontal operculum compatible with acute/early subacute infarction. No associated hemorrhage or mass effect. Infarcts are T2 FLAIR hyperintense. No acute hemorrhage. No extra-axial collection, hydrocephalus, mass effect, or herniation. Small chronic  infarcts in the right parietal cortex and right occipital periventricular white matter. Mild chronic microvascular ischemic changes of the brain. Mild brain parenchymal volume loss. Vascular: Please refer to CT angiogram of the head and neck. Skull and upper cervical spine: Normal marrow signal. Sinuses/Orbits: Right frontal, ethmoid, and maxillary sinus partial opacification and fluid level. No abnormal signal of the mastoid air cells. Orbits are unremarkable. Other: None. IMPRESSION: 1. Multiple small foci of acute/early subacute infarction are present in the left MCA distribution concentrated in left basal ganglia inclusive of the corona radiata and posterior limb of internal capsule. No hemorrhage or mass effect. 2. Mild chronic microvascular ischemic changes and volume loss of the brain. Small chronic infarcts in right parietal and occipital lobes. 3. Right frontal, ethmoid, and maxillary sinus disease is a right middle meatus obstructive pattern, direct visualization recommended. These results were called by telephone at the time of interpretation on 08/30/2018 at 10:46 pm to Dr. Leonel Ramsay, who verbally acknowledged these results. Electronically Signed   By: Kristine Garbe M.D.   On: 08/30/2018 22:48     Dg Hand Complete Right Result Date: 09/03/2018 CLINICAL DATA:  Pt was a recent admit into the hospital for a stroke. Pt fell 4 days ago and caught himself with his right hand. Pt c/o today of right hand pain and swelling  even after the stroke affected the patient's right side. EXAM: RIGHT HAND - COMPLETE 3+ VIEW COMPARISON:  None. FINDINGS: There is subtle density proximal to the triquetrum/the deformed, also identified along the dorsum of the wrist on the LATERAL view. There is significant soft tissue swelling along the dorsum of the hand. IMPRESSION: 1. Soft tissue swelling. 2. Possible fracture of the triquetrum. Electronically Signed   By: Nolon Nations M.D.   On: 09/03/2018 12:49    Vas [place] Transcranial Doppler W Bubbles Result Date: 09/01/2018  Transcranial Doppler with Bubble Indications: Stroke. Performing Technologist: Maudry Mayhew MHA, RDMS, RVT, RDCS  Examination Guidelines: A complete evaluation includes B-mode imaging, spectral Doppler, color Doppler, and power Doppler as needed of all accessible portions of each vessel. Bilateral testing is considered an integral part of a complete examination. Limited examinations for reoccurring indications may be performed as noted.  Summary:  A vascular evaluation was performed. The right middle cerebral artery was studied. An IV was inserted into the patient's left forearm. Verbal informed consent was obtained.  There is no evidence of HITS (high intensity transient signals) at rest or with Valsalva. Therefore, there is no evidence of PFO (patent foramen ovale). *See table(s) above for measurements and observations.    Preliminary     Vas [place] Lower Extremity Venous (dvt) Result Date: 09/01/2018  Lower Venous Study Indications: Stroke.  Performing Technologist: Maudry Mayhew MHA, RDMS, RVT, RDCS  Examination Guidelines: A complete evaluation includes B-mode imaging, spectral Doppler, color Doppler, and power Doppler as needed of all accessible portions of each vessel. Bilateral testing is considered an integral part of a complete examination. Limited examinations for reoccurring indications may be performed as noted.  Right Venous Summary: Right: There is no  evidence of deep vein thrombosis in the lower extremity. However, portions of this examination were limited- see technologist comments above. No cystic structure found in the popliteal fossa. Left: There is no evidence of deep vein thrombosis in the lower extremity. However, portions of this examination were limited- see technologist comments above. No cystic structure found in the popliteal fossa.  Pulsatile lower extremity venous flow is suggestive of possible  elevated right-sided heart pressure. *See table(s) above for measurements and observations. Electronically signed by Monica Martinez MD on 09/01/2018 at 3:44:34 PM.    Final     12-lead ECG SR only, in 2016 he does have EKGs with WPW, not note on prior EKGs or since All prior EKG's in EPIC reviewed with no documented atrial fibrillation  Telemetry S only  Assessment and Plan:  1. Cryptogenic stroke The patient presents with cryptogenic stroke.  The patient has a TEE planned for this afternoon.  I spoke at length with the patient about monitoring for afib with either a 30 day event monitor or an implantable loop recorder.  Risks, benefits, and alteratives to implantable loop recorder were discussed with the patient today.   At this time, the patient is very clear in their decision to proceed with implantable loop recorder, his daughter at bedside reports a number of conversations with neurology team have been had about this as well.   Wound care was reviewed with the patient (keep incision clean and dry for 3 days).  Wound check will be scheduled for the ptient  Please call with questions.   Renee Dyane Dustman, PA-C 09/04/2018   EP Attending  Patient seen and examined. Agree with the findings as noted above. The patient has a h/o WPW with recent loss of pre-excitation who presented after eperiencing a stroke. He has a dense expressive aphasia and right HP.  I would recommend TEE and if negative insertion of an ILR. I have reviewed the  indications, risks, benefits, goals, and expectations and he wishes to proceed.   Mikle Bosworth.D.

## 2018-09-04 NOTE — Progress Notes (Signed)
  Speech Language Pathology Treatment: Cognitive-Linquistic  Patient Details Name: Edward Glass MRN: [id number] DOB: [birth year] Today's Date: 09/04/2018 Time: 0750-0820 SLP Time Calculation (min) (ACUTE ONLY): 30 min  Assessment / Plan / Recommendation Clinical Impression  Skilled treatment session focused on communication goals and education with pt's daughter. Pt is currently NPO d/t pending TEE. SLP facilitated session by providing Max A verbal cues to recite rote information (days of week and months). Pt with intermittent perseverative repetition of previous word but with phonemic cues, he was able to produce correct word. Pt with increased self-monitoring during completion of simple yes/no questions. Pt self-corrected x 3 to achieve appropriate answers related to his daughter, location, CVA and general time of year. Pt's communication was limited to word length and overall decreased vocal intensity. Pt would benefit from RMT assessment at next level of care. Continue to recommend CIR. Education provided to pt's daughter on facilitating language, plan of care for targeting dysphagia and video of MBS reviewed with her. All questions answered to her satisfaction.    HPI HPI: Pt is a 63 y.o. male with medical history significant of hypogonadism, possible BPH, who presents with confusion, difficulty speaking, right facial droop. 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 Pt demonstrated increased weakness on 08/31/18. A repeat CT of the head was conducted and it revealed a more confluent acute infarct in the left basal ganglia and corona radiata when compared to diffusion imaging.       SLP Plan  Continue with current plan of care       Recommendations  Diet recommendations: Dysphagia 2 (fine chop);Nectar-thick  liquid(after proceedure)                General recommendations: Rehab consult Oral Care Recommendations: Oral care BID Follow up Recommendations: Inpatient Rehab SLP Visit Diagnosis: Aphasia (R47.01) Plan: Continue with current plan of care       GO                Kassity Woodson 09/04/2018, 9:25 AM

## 2018-09-04 NOTE — Progress Notes (Signed)
STROKE TEAM PROGRESS NOTE   SUBJECTIVE (INTERVAL HISTORY) His daughter and PT are at the bedside.  No acute event overnight, no neuro changes.  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  Plan for loop recorder.  OBJECTIVE Vitals:   09/04/18 1615 09/04/18 1620 09/04/18 1625 09/04/18 1630  BP: 119/71 124/70  136/82  Pulse: 70 65 73 69  Resp: 20 20 17 18   Temp:      TempSrc:      SpO2: 93% 97% 100% 96%  Weight:      Height:        CBC:  Recent Labs  Lab 08/30/18 1546  WBC 6.5  NEUTROABS 3.8  HGB 15.4  HCT 48.5  MCV 91.9  PLT 433    Basic Metabolic Panel:  Recent Labs  Lab 08/30/18 1546  NA 140  K 3.7  CL 105  CO2 23  GLUCOSE 100*  BUN 17  CREATININE 0.91  CALCIUM 9.4    Lipid Panel:     Component Value Date/Time   CHOL 173 08/31/2018 0447   CHOL 159 08/15/2013 0818   TRIG 86 08/31/2018 0447   TRIG 52 08/15/2013 0818   HDL 52 08/31/2018 0447   HDL 63 08/15/2013 0818   CHOLHDL 3.3 08/31/2018 0447   VLDL 17 08/31/2018 0447   LDLCALC 104 (H) 08/31/2018 0447   LDLCALC 86 08/15/2013 0818   HgbA1c:  Lab Results  Component Value Date   HGBA1C 6.2 (H) 08/31/2018   Urine Drug Screen:     Component Value Date/Time   LABOPIA NONE DETECTED 08/30/2018 1817   COCAINSCRNUR NONE DETECTED 08/30/2018 1817   LABBENZ NONE DETECTED 08/30/2018 1817   AMPHETMU NONE DETECTED 08/30/2018 1817   THCU NONE DETECTED 08/30/2018 1817   LABBARB NONE DETECTED 08/30/2018 1817    Alcohol Level     Component Value Date/Time   ETH <10 08/30/2018 1657    IMAGING  Ct Angio Head W Or Wo Contrast  Result Date: 08/30/2018 CLINICAL DATA:  Initial evaluation for acute speech difficulty, right facial weakness. EXAM: CT ANGIOGRAPHY HEAD AND NECK CT PERFUSION BRAIN TECHNIQUE: Multidetector CT imaging of the head and neck was performed using the standard protocol during bolus administration of intravenous contrast. Multiplanar CT image reconstructions and MIPs  were obtained to evaluate the vascular anatomy. Carotid stenosis measurements (when applicable) are obtained utilizing NASCET criteria, using the distal internal carotid diameter as the denominator. Multiphase CT imaging of the brain was performed following IV bolus contrast injection. Subsequent parametric perfusion maps were calculated using RAPID software. CONTRAST:  131mL ISOVUE-370 IOPAMIDOL (ISOVUE-370) INJECTION 76% COMPARISON:  Prior noncontrast CT from earlier same day. FINDINGS: CTA NECK FINDINGS Aortic arch: Visualized aortic arch of normal caliber with normal branch pattern. No hemodynamically significant stenosis about the origin of the great vessels. Visualized subclavian arteries widely patent. Right carotid system: Right common carotid artery patent from its origin to the bifurcation without stenosis. Minimal mixed plaque about the right bifurcation without stenosis. Short-segment mild stenosis of approximately 20% by NASCET criteria just distally within the proximal right ICA. Right ICA otherwise widely patent to the skull base without stenosis, dissection, or occlusion. Left carotid system: Left common carotid artery patent from its origin to the bifurcation without stenosis. No significant atheromatous narrowing about the left bifurcation. Left ICA widely patent from the bifurcation to the skull base without stenosis, dissection, or occlusion. Vertebral arteries: Both of the vertebral arteries arise from the subclavian arteries. Vertebral arteries  widely patent within the neck without stenosis, dissection, or occlusion. Skeleton: No acute osseous finding. No discrete lytic or blastic osseous lesions. Mild to moderate cervical spondylolysis at C4-5 through C6-7. Prominent right-sided facet arthrosis noted at C3-4. 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 Soft tissues of the neck demonstrate no other acute finding. Upper chest: Layering fluid noted within the upper esophageal lumen.  Visualized upper chest demonstrates no other acute finding. Partially visualized lungs are clear. Review of the MIP images confirms the above findings CTA HEAD FINDINGS Anterior circulation: Petrous ICAs widely patent bilaterally. Cavernous and supraclinoid segments widely patent without stenosis. 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 ICA termini well perfused distally. A1 segments patent bilaterally. Right A1 hypoplastic, likely accounting for the dominant left ICA as compared to the right. Normal anterior communicating artery. Anterior cerebral arteries widely patent to their distal aspects without stenosis. Right M1 widely patent without stenosis. Normal right MCA bifurcation. Distal right MCA branches well perfused. Left M1 patent proximally. 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 Upon review of prior MRI from 2010, this is likely chronic in nature. Stenosis measures approximately 3 mm in length. Left M1 patent distally. Normal left MCA bifurcation. Left MCA branches are opacified distally, although overall attenuated as compared to the right due to the severe M1 stenosis. Posterior circulation: Vertebral arteries patent to the vertebrobasilar junction without stenosis. Posterior inferior cerebral arteries patent bilaterally. Proximal and mid basilar artery diminutive but widely patent without stenosis. Persistent left-sided trigeminal artery supplies the distal basilar artery which is more robust in caliber. Superior cerebral arteries patent bilaterally. Both of the posterior cerebral arteries primarily supplied via the basilar and are well perfused to their distal aspects. Venous sinuses: Grossly patent, although limited in assessment due to arterial timing the contrast bolus. Anatomic variants: Persistent left trigeminal artery. No intracranial aneurysm. Delayed  phase: Not performed. Review of the MIP images confirms the above findings CT Brain Perfusion Findings: CBF (<30%) Volume: 35mL Perfusion (Tmax>6.0s) volume: 196mL Mismatch Volume: 131mL Infarction Location:No core infarct identified by CT perfusion. Large perfusion mismatch throughout the left MCA territory due to the severe left M1 stenosis. IMPRESSION: 1. Negative CTA for large vessel occlusion. No core infarct evident by CT perfusion. 2. Short-segment severe mid left M1 stenosis, likely similar as compared to previous MRI from 11/19/2008. Associated delayed perfusion throughout the left MCA territory distally. 3. No other hemodynamically significant or correctable stenosis within the major arterial vasculature of the head and neck. 4. Persistent left trigeminal artery. Finding discussed with Dr. Leonel Ramsay by telephone at approximately 9:45 p.m. on 08/30/2018. Electronically Signed   By: Jeannine Boga M.D.   On: 08/30/2018 22:59   Dg Chest 2 View  Result Date: 08/30/2018 CLINICAL DATA:  63 year old male with slurred speech and cough today. EXAM: CHEST - 2 VIEW COMPARISON:  08/27/2018 chest radiographs and earlier. FINDINGS: Lower lung volumes on both views. No pneumothorax, pulmonary edema, pleural effusion or consolidation. Mild basilar crowding of markings. Stable cardiac size at the upper limits of normal. Other mediastinal contours are within normal limits. Visualized tracheal air column is within normal limits. No acute osseous abnormality identified. Negative visible bowel gas pattern. Previous ventral abdominal hernia repair with mesh. IMPRESSION: Lower lung volumes, otherwise no acute cardiopulmonary abnormality. Electronically Signed   By: Genevie Ann M.D.   On: 08/30/2018 17:15   Dg  Chest 2 View  Result Date: 08/27/2018 CLINICAL DATA:  Dizziness for 1 day. EXAM: CHEST - 2 VIEW COMPARISON:  Chest CT, 08/14/2013 FINDINGS: The heart size and mediastinal contours are within normal limits. Both  lungs are clear. No pleural effusion or pneumothorax. The visualized skeletal structures are unremarkable. IMPRESSION: No active cardiopulmonary disease. Electronically Signed   By: Lajean Manes M.D.   On: 08/27/2018 12:40   Ct Head Wo Contrast  Result Date: 09/01/2018 CLINICAL DATA:  Follow-up stroke.  Increased weakness on the right. EXAM: CT HEAD WITHOUT CONTRAST TECHNIQUE: Contiguous axial images were obtained from the base of the skull through the vertex without intravenous contrast. COMPARISON:  Brain MRI from 2 days ago FINDINGS: Brain: Acute infarct in the left deep watershed/lateral lenticulostriate distribution which has a more confluent low-density appearance than seen on prior diffusion imaging. No new territory involvement. No hemorrhagic conversion. Small remote right parietal cortex infarct. No hydrocephalus or masslike finding Vascular: Atherosclerotic calcification Skull: No acute finding Sinuses/Orbits: Right frontal maxillary and ethmoid sinusitis, also seen on prior study. IMPRESSION: More confluent acute infarct in the left basal ganglia and corona radiata when compared to diffusion imaging 2 days ago. No new distribution infarct or hemorrhagic conversion. Electronically Signed   By: Monte Fantasia M.D.   On: 09/01/2018 09:17   Ct Head Wo Contrast  Result Date: 08/30/2018 CLINICAL DATA:  63 year old male with abnormal speech onset today. EXAM: CT HEAD WITHOUT CONTRAST TECHNIQUE: Contiguous axial images were obtained from the base of the skull through the vertex without intravenous contrast. COMPARISON:  Brain MRI 11/19/2008. FINDINGS: Brain: A small area of right parietal lobe cortical encephalomalacia is redemonstrated on series 3, image 22. Cerebral volume has not significantly changed. New abnormal hypodensity in the left posterior limb internal capsule and/or lentiform (series 3, image 14). New hypodensity also in the left caudate and anterior lentiform on image 17. No acute  intracranial hemorrhage identified. No midline shift, mass effect, or evidence of intracranial mass lesion. No ventriculomegaly. No acute cortically based infarct identified. Incidental tentorial dural calcifications. Vascular: No suspicious intracranial vascular hyperdensity. Skull: Negative. Sinuses/Orbits: Right frontal ethmoid and maxillary sinus disease with fluid levels is new since 2010. The right sphenoid and left paranasal sinuses are spared. Tympanic cavities and mastoids are clear. Other: Mildly Disconjugate gaze, otherwise negative orbits. Visualized scalp soft tissues are within normal limits. IMPRESSION: 1. 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 2. Right [facility] pattern paranasal sinusitis. Electronically Signed   By: Genevie Ann M.D.   On: 08/30/2018 17:39   Ct Angio Neck W Or Wo Contrast  Result Date: 08/30/2018 CLINICAL DATA:  Initial evaluation for acute speech difficulty, right facial weakness. EXAM: CT ANGIOGRAPHY HEAD AND NECK CT PERFUSION BRAIN TECHNIQUE: Multidetector CT imaging of the head and neck was performed using the standard protocol during bolus administration of intravenous contrast. Multiplanar CT image reconstructions and MIPs were obtained to evaluate the vascular anatomy. Carotid stenosis measurements (when applicable) are obtained utilizing NASCET criteria, using the distal internal carotid diameter as the denominator. Multiphase CT imaging of the brain was performed following IV bolus contrast injection. Subsequent parametric perfusion maps were calculated using RAPID software. CONTRAST:  175mL ISOVUE-370 IOPAMIDOL (ISOVUE-370) INJECTION 76% COMPARISON:  Prior noncontrast CT from earlier same day. FINDINGS: CTA NECK FINDINGS Aortic arch: Visualized aortic arch of normal caliber with normal branch pattern. No hemodynamically significant stenosis about the origin of the great vessels. Visualized subclavian arteries widely  patent. Right  carotid system: Right common carotid artery patent from its origin to the bifurcation without stenosis. Minimal mixed plaque about the right bifurcation without stenosis. Short-segment mild stenosis of approximately 20% by NASCET criteria just distally within the proximal right ICA. Right ICA otherwise widely patent to the skull base without stenosis, dissection, or occlusion. Left carotid system: Left common carotid artery patent from its origin to the bifurcation without stenosis. No significant atheromatous narrowing about the left bifurcation. Left ICA widely patent from the bifurcation to the skull base without stenosis, dissection, or occlusion. Vertebral arteries: Both of the vertebral arteries arise from the subclavian arteries. Vertebral arteries widely patent within the neck without stenosis, dissection, or occlusion. Skeleton: No acute osseous finding. No discrete lytic or blastic osseous lesions. Mild to moderate cervical spondylolysis at C4-5 through C6-7. Prominent right-sided facet arthrosis noted at C3-4. 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 Soft tissues of the neck demonstrate no other acute finding. Upper chest: Layering fluid noted within the upper esophageal lumen. Visualized upper chest demonstrates no other acute finding. Partially visualized lungs are clear. Review of the MIP images confirms the above findings CTA HEAD FINDINGS Anterior circulation: Petrous ICAs widely patent bilaterally. Cavernous and supraclinoid segments widely patent without stenosis. 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 ICA termini well perfused distally. A1 segments patent bilaterally. Right A1 hypoplastic, likely accounting for the dominant left ICA as compared to the right. Normal anterior communicating artery. Anterior cerebral arteries widely patent to their distal aspects without stenosis. Right M1 widely patent without stenosis. Normal right MCA bifurcation.  Distal right MCA branches well perfused. Left M1 patent proximally. 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 Upon review of prior MRI from 2010, this is likely chronic in nature. Stenosis measures approximately 3 mm in length. Left M1 patent distally. Normal left MCA bifurcation. Left MCA branches are opacified distally, although overall attenuated as compared to the right due to the severe M1 stenosis. Posterior circulation: Vertebral arteries patent to the vertebrobasilar junction without stenosis. Posterior inferior cerebral arteries patent bilaterally. Proximal and mid basilar artery diminutive but widely patent without stenosis. Persistent left-sided trigeminal artery supplies the distal basilar artery which is more robust in caliber. Superior cerebral arteries patent bilaterally. Both of the posterior cerebral arteries primarily supplied via the basilar and are well perfused to their distal aspects. Venous sinuses: Grossly patent, although limited in assessment due to arterial timing the contrast bolus. Anatomic variants: Persistent left trigeminal artery. No intracranial aneurysm. Delayed phase: Not performed. Review of the MIP images confirms the above findings CT Brain Perfusion Findings: CBF (<30%) Volume: 11mL Perfusion (Tmax>6.0s) volume: 157mL Mismatch Volume: 155mL Infarction Location:No core infarct identified by CT perfusion. Large perfusion mismatch throughout the left MCA territory due to the severe left M1 stenosis. IMPRESSION: 1. Negative CTA for large vessel occlusion. No core infarct evident by CT perfusion. 2. Short-segment severe mid left M1 stenosis, likely similar as compared to previous MRI from 11/19/2008. Associated delayed perfusion throughout the left MCA territory distally. 3. No other hemodynamically significant or correctable stenosis within the major arterial vasculature of  the head and neck. 4. Persistent left trigeminal artery. Finding discussed with Dr. Leonel Ramsay by telephone at approximately 9:45 p.m. on 08/30/2018. Electronically Signed   By: Jeannine Boga M.D.   On: 08/30/2018 22:59   Mr Brain Wo Contrast  Result Date:  08/30/2018 CLINICAL DATA:  63 y/o  M; right-sided weakness. EXAM: MRI HEAD WITHOUT CONTRAST TECHNIQUE: Multiplanar, multiecho pulse sequences of the brain and surrounding structures were obtained without intravenous contrast. COMPARISON:  08/30/2018 CT head, CT perfusion head, and CTA head. FINDINGS: Brain: Multiple foci of reduced diffusion are present within the left caudate body, left mid and posterior corona radiata, left posterior limb of internal capsule extending into left putamen, as well as several additional small foci of the temporoparietal periventricular white matter and the left frontal operculum compatible with acute/early subacute infarction. No associated hemorrhage or mass effect. Infarcts are T2 FLAIR hyperintense. No acute hemorrhage. No extra-axial collection, hydrocephalus, mass effect, or herniation. Small chronic infarcts in the right parietal cortex and right occipital periventricular white matter. Mild chronic microvascular ischemic changes of the brain. Mild brain parenchymal volume loss. Vascular: Please refer to CT angiogram of the head and neck. Skull and upper cervical spine: Normal marrow signal. Sinuses/Orbits: Right frontal, ethmoid, and maxillary sinus partial opacification and fluid level. No abnormal signal of the mastoid air cells. Orbits are unremarkable. Other: None. IMPRESSION: 1. Multiple small foci of acute/early subacute infarction are present in the left MCA distribution concentrated in left basal ganglia inclusive of the corona radiata and posterior limb of internal capsule. No hemorrhage or mass effect. 2. Mild chronic microvascular ischemic changes and volume loss of the brain. Small chronic infarcts in  right parietal and occipital lobes. 3. Right frontal, ethmoid, and maxillary sinus disease is a right middle meatus obstructive pattern, direct visualization recommended. These results were called by telephone at the time of interpretation on 08/30/2018 at 10:46 pm to Dr. Leonel Ramsay, who verbally acknowledged these results. Electronically Signed   By: Kristine Garbe M.D.   On: 08/30/2018 22:48   Ct Cerebral Perfusion W Contrast  Result Date: 08/30/2018 CLINICAL DATA:  Initial evaluation for acute speech difficulty, right facial weakness. EXAM: CT ANGIOGRAPHY HEAD AND NECK CT PERFUSION BRAIN TECHNIQUE: Multidetector CT imaging of the head and neck was performed using the standard protocol during bolus administration of intravenous contrast. Multiplanar CT image reconstructions and MIPs were obtained to evaluate the vascular anatomy. Carotid stenosis measurements (when applicable) are obtained utilizing NASCET criteria, using the distal internal carotid diameter as the denominator. Multiphase CT imaging of the brain was performed following IV bolus contrast injection. Subsequent parametric perfusion maps were calculated using RAPID software. CONTRAST:  172mL ISOVUE-370 IOPAMIDOL (ISOVUE-370) INJECTION 76% COMPARISON:  Prior noncontrast CT from earlier same day. FINDINGS: CTA NECK FINDINGS Aortic arch: Visualized aortic arch of normal caliber with normal branch pattern. No hemodynamically significant stenosis about the origin of the great vessels. Visualized subclavian arteries widely patent. Right carotid system: Right common carotid artery patent from its origin to the bifurcation without stenosis. Minimal mixed plaque about the right bifurcation without stenosis. Short-segment mild stenosis of approximately 20% by NASCET criteria just distally within the proximal right ICA. Right ICA otherwise widely patent to the skull base without stenosis, dissection, or occlusion. Left carotid system: Left common  carotid artery patent from its origin to the bifurcation without stenosis. No significant atheromatous narrowing about the left bifurcation. Left ICA widely patent from the bifurcation to the skull base without stenosis, dissection, or occlusion. Vertebral arteries: Both of the vertebral arteries arise from the subclavian arteries. Vertebral arteries widely patent within the neck without stenosis, dissection, or occlusion. Skeleton: No acute osseous finding. No discrete lytic or blastic osseous lesions. Mild to moderate cervical spondylolysis at C4-5 through C6-7.  Prominent right-sided facet arthrosis noted at C3-4. 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 Soft tissues of the neck demonstrate no other acute finding. Upper chest: Layering fluid noted within the upper esophageal lumen. Visualized upper chest demonstrates no other acute finding. Partially visualized lungs are clear. Review of the MIP images confirms the above findings CTA HEAD FINDINGS Anterior circulation: Petrous ICAs widely patent bilaterally. Cavernous and supraclinoid segments widely patent without stenosis. 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 ICA termini well perfused distally. A1 segments patent bilaterally. Right A1 hypoplastic, likely accounting for the dominant left ICA as compared to the right. Normal anterior communicating artery. Anterior cerebral arteries widely patent to their distal aspects without stenosis. Right M1 widely patent without stenosis. Normal right MCA bifurcation. Distal right MCA branches well perfused. Left M1 patent proximally. 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 Upon review of prior MRI from 2010, this is likely chronic in nature. Stenosis measures approximately 3 mm in length. Left M1 patent distally. Normal left MCA bifurcation. Left MCA branches are  opacified distally, although overall attenuated as compared to the right due to the severe M1 stenosis. Posterior circulation: Vertebral arteries patent to the vertebrobasilar junction without stenosis. Posterior inferior cerebral arteries patent bilaterally. Proximal and mid basilar artery diminutive but widely patent without stenosis. Persistent left-sided trigeminal artery supplies the distal basilar artery which is more robust in caliber. Superior cerebral arteries patent bilaterally. Both of the posterior cerebral arteries primarily supplied via the basilar and are well perfused to their distal aspects. Venous sinuses: Grossly patent, although limited in assessment due to arterial timing the contrast bolus. Anatomic variants: Persistent left trigeminal artery. No intracranial aneurysm. Delayed phase: Not performed. Review of the MIP images confirms the above findings CT Brain Perfusion Findings: CBF (<30%) Volume: 80mL Perfusion (Tmax>6.0s) volume: 166mL Mismatch Volume: 167mL Infarction Location:No core infarct identified by CT perfusion. Large perfusion mismatch throughout the left MCA territory due to the severe left M1 stenosis. IMPRESSION: 1. Negative CTA for large vessel occlusion. No core infarct evident by CT perfusion. 2. Short-segment severe mid left M1 stenosis, likely similar as compared to previous MRI from 11/19/2008. Associated delayed perfusion throughout the left MCA territory distally. 3. No other hemodynamically significant or correctable stenosis within the major arterial vasculature of the head and neck. 4. Persistent left trigeminal artery. Finding discussed with Dr. Leonel Ramsay by telephone at approximately 9:45 p.m. on 08/30/2018. Electronically Signed   By: Jeannine Boga M.D.   On: 08/30/2018 22:59   Vas [place] Transcranial Doppler W Bubbles  Result Date: 09/01/2018  Transcranial Doppler with Bubble Indications: Stroke. Performing Technologist: Maudry Mayhew MHA, RDMS, RVT,  RDCS  Examination Guidelines: A complete evaluation includes B-mode imaging, spectral Doppler, color Doppler, and power Doppler as needed of all accessible portions of each vessel. Bilateral testing is considered an integral part of a complete examination. Limited examinations for reoccurring indications may be performed as noted.  Summary:  A vascular evaluation was performed. The right middle cerebral artery was studied. An IV was inserted into the patient's left forearm. Verbal informed consent was obtained.  There is no evidence of HITS (high intensity transient signals) at rest or with Valsalva. Therefore, there is no evidence of PFO (patent foramen ovale). *See table(s) above for measurements and observations.    Preliminary    Vas [place] Lower Extremity Venous (dvt)  Result Date:  09/01/2018  Lower Venous Study Indications: Stroke.  Performing Technologist: Maudry Mayhew MHA, RDMS, RVT, RDCS  Examination Guidelines: A complete evaluation includes B-mode imaging, spectral Doppler, color Doppler, and power Doppler as needed of all accessible portions of each vessel. Bilateral testing is considered an integral part of a complete examination. Limited examinations for reoccurring indications may be performed as noted.  Right Venous Findings: +---------+---------------+---------+-----------+----------+--------------+          CompressibilityPhasicitySpontaneityPropertiesSummary        +---------+---------------+---------+-----------+----------+--------------+ CFV      Full           Yes      Yes                                 +---------+---------------+---------+-----------+----------+--------------+ SFJ      Full                                                        +---------+---------------+---------+-----------+----------+--------------+ FV Prox  Full                                                         +---------+---------------+---------+-----------+----------+--------------+ FV Mid   Full                                                        +---------+---------------+---------+-----------+----------+--------------+ FV DistalFull                                                        +---------+---------------+---------+-----------+----------+--------------+ PFV      Full                                                        +---------+---------------+---------+-----------+----------+--------------+ POP      Full                    Yes                  Rouleaux flow  +---------+---------------+---------+-----------+----------+--------------+ PTV      Full                                                        +---------+---------------+---------+-----------+----------+--------------+ PERO  Not visualized +---------+---------------+---------+-----------+----------+--------------+  Left Venous Findings: +---------+---------------+---------+-----------+----------+-------+          CompressibilityPhasicitySpontaneityPropertiesSummary +---------+---------------+---------+-----------+----------+-------+ CFV      Full           No       Yes                          +---------+---------------+---------+-----------+----------+-------+ SFJ      Full                                                 +---------+---------------+---------+-----------+----------+-------+ FV Prox  Full                                                 +---------+---------------+---------+-----------+----------+-------+ FV Mid   Full                                                 +---------+---------------+---------+-----------+----------+-------+ FV DistalFull                                                 +---------+---------------+---------+-----------+----------+-------+ PFV      Full                                                  +---------+---------------+---------+-----------+----------+-------+ POP      Full                    Yes                          +---------+---------------+---------+-----------+----------+-------+ PTV      Full                                                 +---------+---------------+---------+-----------+----------+-------+ PERO     Full                                                 +---------+---------------+---------+-----------+----------+-------+    Summary: Right: There is no evidence of deep vein thrombosis in the lower extremity. However, portions of this examination were limited- see technologist comments above. No cystic structure found in the popliteal fossa. Left: There is no evidence of deep vein thrombosis in the lower extremity. However, portions of this examination were limited- see technologist comments above. No cystic structure found in the popliteal fossa.  Pulsatile lower extremity venous flow is suggestive of possible elevated right-sided heart pressure. *See table(s) above for measurements and observations. Electronically signed by Monica Martinez MD  on 09/01/2018 at 3:44:34 PM.    Final    Transthoracic Echocardiogram  Normal ejection fraction 55-60%. No left ventricular hypertrophy or clot.  TEE - Small PFO/bubble crossover noted during Valsalva.  No embolic source identified.    PHYSICAL EXAM  Temp:  [98 F (36.7 C)-99 F (37.2 C)] 99 F (37.2 C) (02/10 1612) Pulse Rate:  [56-78] 69 (02/10 1630) Resp:  [15-21] 18 (02/10 1630) BP: (113-149)/(64-82) 136/82 (02/10 1630) SpO2:  [93 %-100 %] 96 % (02/10 1630)  General - Well nourished, well developed, in no apparent distress.  Ophthalmologic - fundi not visualized due to noncooperation.  Cardiovascular - Regular rate and rhythm.  Mental Status -  Level of arousal and orientation to place, and person were intact, but not orientated to time. Able to repeat simple  sentences but not able to name. Following all simple commands, but paucity of speech with moderate dysarthria. Perseveration on numbers    Cranial Nerves II - XII - II - Visual field intact OU. III, IV, VI - Extraocular movements intact. V - Facial sensation decreased on the right. VII - right facial droop. VIII - Hearing & vestibular intact bilaterally. X - Palate elevates symmetrically, moderate dysarthria with hypophonia. XI - Chin turning & shoulder shrug intact bilaterally. XII - Tongue protrusion intact.  Motor Strength - The patient's strength was normal in LUE and LLE, but right hemiplegia 0/5 RUE and 1/5 [name].  Bulk was normal and fasciculations were absent.   Motor Tone - Muscle tone was assessed at the neck and appendages and was normal.  Reflexes - The patient's reflexes were symmetrical in all extremities and he had right babinski positive.   Sensory - Light touch, temperature/pinprick were assessed and were decreased on the right  Coordination - The patient had normal movements in the left hand with no ataxia or dysmetria.  Tremor was absent.  Gait and Station - deferred.   ASSESSMENT/PLAN 63 year old male who is very fit/healthy at baseline. MRI shows new scattered left acute ischemic strokes. CTA shows L M1 near occulsion. He arrived greater than 24h from time since last well, therefore no acute stroke interventions done. 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 Stroke wk up underway. Pt and SO deny any palpations. When pts dtrs arrive, they both explain that in 2016 after tricep repair surgery they noted a possible arrhythmia and placed him on holter monitor for 30d, but was unrevealing.    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 patient has had neurological worsening. Extension of deficits on 09/01/18.  Resultant right side weakness, dysarthria  and mixed aphasia  CT head patchy areas in the left hemisphere MRI head -  MRA head - scattered L MCA infarcts, mostly concentrated in L BG  CTA H&N- severe stenosis versus occlusion of the left M1  CT perfusion no infarct core but large penumbra  2D Echo -EF 55 to 60%, normal size of left atrium  TCD bubble study no PFO  LE venous Doppler negative for DVT  TEE - small PFO/bubble crossover noted during Valsalva, not clinically significant  Loop recorder will be placed  LDL - 104  HgbA1c - 6.2  UDS - neg  VTE prophylaxis - lovenox  prior to admission was taking 81mg  ASA, now on DAPT.  Continue aspirin 325 and Plavix 75 DAPT for 3 months and then Plavix alone given severe left M1 stenosis.  Patient counseled to be compliant with his antithrombotic medications  Ongoing aggressive stroke risk factor management  Therapy recommendations:  CIR  Disposition:  Pending  History of heart palpitation  Patient seem to have one episode of racing heart in 2016  48-hour Holter monitoring 06/15/2015 unrevealing  Loop recorder will be placed  Hypertension  Stable  Avoid hypotension . Long-term BP goal 130-150 given severe left M1 stenosis  Hyperlipidemia  Lipid lowering medication PTA:  none  LDL 104, goal < 70  Current lipid lowering medication: Lipitor 40 mg daily  Continue statin at discharge  Other Stroke Risk Factors  Family hx stroke  Other Active Problems  Depression- d/t pts work as [employer], he is feeling very devastated by this dx. Prozac started 08/31/2018 as this has been shown to also improve motor deficits in stroke pts.   Hospital day # 4   Neurology will sign off. Please call with questions. Pt will follow up with stroke clinic NP at [facility] in about 4 weeks. Thanks for the consult.   Rosalin Hawking, MD PhD Stroke Neurology 09/04/2018 5:50 PM    To contact Stroke Continuity provider, please refer to [email or web address] After hours,  contact General Neurology

## 2018-09-04 NOTE — Progress Notes (Signed)
Inpatient Rehabilitation-Admissions Coordinator   Medical workup still underway. AC will follow up with pt tomorrow for possible admit early AM.   Please call if questions.   Jhonnie Garner, OTR/L  Rehab Admissions Coordinator  [phone] 09/04/2018 5:20 PM

## 2018-09-04 NOTE — H&P (View-Only) (Signed)
ELECTROPHYSIOLOGY CONSULT NOTE  Patient ID: Edward Glass MRN: [id number], DOB/AGE: 30-Dec-1955   Admit date: 08/30/2018 Date of Consult: 09/04/2018  Primary Physician: Lurline Hare, MD Primary Cardiologist: Dr. Debara Pickett ([id number]) Reason for Consultation: Cryptogenic stroke ; recommendations regarding Implantable Loop Recorder, requested by Dr. 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  I n review of old record h/o steroid, GH, and T3 use was associated with weight lifting remotely.  T Imaging demonstrated left MCA patchy secondary to left M1 occlusion, embolic pattern, etiology unclear. patient has had neurological worsening. Extension of deficits on 09/01/18..  he has undergone workup for stroke including echocardiogram and carotid angio.  The patient has been monitored on telemetry which has demonstrated sinus rhythm with no arrhythmias.  Inpatient stroke work-up is to be completed with a TEE.   Echocardiogram this admission demonstrated   IMPRESSIONS  1. The left ventricle has normal systolic function of 78-29%. The cavity size was normal. There is no increased left ventricular wall thickness. Echo evidence of impaired diastolic relaxation.  2. The right ventricle has normal systolic function. The cavity was normal. There is no increase in right ventricular wall thickness.  3. The mitral valve is normal in structure. No evidence of mitral valve stenosis. No significant mitral regurgitation.  4. The tricuspid valve is normal in structure.  5. The aortic valve is tricuspid There is mild calcification of the aortic valve. No aortic stenosis.  6. The pulmonic valve was normal in structure.  7. The aortic root and ascending aorta are normal in size and structure.  8. No evidence of left ventricular regional wall motion abnormalities.  9. The  inferior vena cava is normal in size with greater than 50% respiratory variability. 10. No complete TR doppler jet so unable to estimate PA systolic pressure.   Lab work is reviewed.  Prior to admission, the patient denies chest pain, shortness of breath, dizziness, or syncope.   He has h/o of cardiac and EP evaluation back in 2016/2017 for pre-op eval, and palpitations.  He had EKGs with findings of pre-excitation noted, Dr. Curt Bears felt had a possible anteroseptal pathway. His heart rate on the EKG is 57. An EKG done in the office today showed no evidence of preexcitation and a heart rate of 72. It is possible that he does not have a robust pathway and therefore would not be able to sustain tachycardia, a 48 hour monitor was done with no arrhythmias  The patient is able to say yes and no, he does not recall the prior w/u noted above, though his daughter at bedside does, he says no to any ongoing or recent palpitations.  His daughter and friend at bedside mention Sunday prior to his admission was at the gym, talking with the friend when he suddenly became pale, weak, seemed to gram his chest/clentch his fist and nearly fainted  though helped to the floor.  He woke quickly though unable to stand under his own power with some observation of LEFT sided/leg weakness, EMS was called and by then felt well again, with no abnormalities, he did though get transported, given IVF on the way to the ER and discharged (noting no mention of any particular L sided weakness being reported at that visit)  He is planned for CIR at discharge today    Past Medical History:  Diagnosis Date  . Atypical chest pain 08/10/2013  . Elevated aspartate aminotransferase level 06/16/2015  . Excessive salivation 06/16/2015  . H/O nutritional disorder 06/16/2015  . 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 Screening for prostate cancer 08/21/2014  . 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 HERNIA REPAIR  2006  . left tricep surgery       Medications Prior to Admission  Medication Sig Dispense Refill Last Dose  . alfuzosin (UROXATRAL) 10 MG 24 hr tablet Take 10 mg by mouth 3 (three) times a week.   Past Week at Unknown time  . aspirin 81 MG tablet Take 81 mg by mouth daily.   08/29/2018 at 0800  . MILK THISTLE PO Take 2 tablets by mouth daily.   08/29/2018 at Unknown time  . Misc Natural Products (SAW PALMETTO) CAPS Take 2-4 capsules by mouth daily.   08/29/2018 at Unknown time  . multivitamin (ONE-A-DAY MEN'S) TABS tablet Take 1 tablet by mouth daily.   08/29/2018 at Unknown time  . sildenafil (REVATIO) 20 MG tablet 2-5 pills as needed for ED symptoms (Patient not taking: Reported on 08/30/2018) 50 tablet 5 Not Taking at Unknown time    Inpatient Medications:  . alfuzosin  10 mg Oral Once per day on Mon Wed Fri  . aspirin EC  81 mg Oral Daily  . atorvastatin  40 mg Oral q1800  . clopidogrel  75 mg Oral Daily  . enoxaparin (LOVENOX) injection  40 mg Subcutaneous Q24H  . multivitamin with minerals  1 tablet Oral Daily    Allergies: No Known Allergies  Social History   Socioeconomic History  . Marital status: Married    Spouse name: Not on file  . Number of children: Not on file  . Years of education: Not on file  . Highest education level: Not on file  Occupational History  . Not on file  Social Needs  . Financial resource strain: Not on file  . Food insecurity:    Worry: Not on file    Inability: Not on file  . Transportation needs:    Medical: Not on file    Non-medical: Not on file  Tobacco Use  . Smoking status: Never Smoker  . Smokeless tobacco: Never Used  Substance and Sexual Activity  . Alcohol use: No  . Drug use: No  . Sexual activity: Not on file  Lifestyle  . Physical activity:    Days per week: Not on file    Minutes per session: Not on file  . Stress: Not on file  Relationships  . Social  connections:    Talks on phone: Not on file    Gets together: Not on file    Attends religious service: Not on file    Active member of club or organization: Not on file    Attends meetings of clubs or organizations: Not on file    Relationship status: Not on file  . Intimate partner violence:    Fear of current or ex partner: Not on file    Emotionally abused: Not on file    Physically abused: Not on file    Forced sexual activity: Not on file  Other Topics Concern  . Not on file  Social History Narrative  . Not on file     Family History  Problem Relation Age of Onset  . Kidney failure Mother   .  Cancer Mother   . Prostate cancer Father   . Cancer Father   . Diabetes Father   . Colon cancer Neg Hx   . Esophageal cancer Neg Hx   . Rectal cancer Neg Hx   . Stomach cancer Neg Hx       Review of Systems: All other systems reviewed and are otherwise negative except as noted above.  Physical Exam: Vitals:   09/03/18 1534 09/03/18 1951 09/04/18 0008 09/04/18 0330  BP: 131/72 132/76 124/73 120/78  Pulse: (!) 58 66 60 73  Resp: 18 16  18   Temp: 99.3 F (37.4 C) 98.7 F (37.1 C) 98.9 F (37.2 C) 98.8 F (37.1 C)  TempSrc: Oral Oral Oral Oral  SpO2: 97% 95% 95% 96%  Weight:      Height:        GEN- The patient is well appearing, alert, full orientation is hard to establish, he tells me yes to his name, and that he had a stroke.Marland Kitchen   Head- normocephalic, atraumatic Eyes-  Sclera clear, conjunctiva pink Ears- hearing intact Oropharynx- clear Neck- supple Lungs- CTA b/l, normal work of breathing Heart- RRR, no murmurs, rubs or gallops  GI- soft, NT, ND Extremities- no clubbing, cyanosis, or edema MS- no significant deformity or atrophy Skin- no rash or lesion Psych- difficult to establish with aphasia, though seems pleasent   Labs:   Lab Results  Component Value Date   WBC 6.5 08/30/2018   HGB 15.4 08/30/2018   HCT 48.5 08/30/2018   MCV 91.9 08/30/2018    PLT 269 08/30/2018    Recent Labs  Lab 08/30/18 1546  NA 140  K 3.7  CL 105  CO2 23  BUN 17  CREATININE 0.91  CALCIUM 9.4  PROT 7.1  BILITOT 0.4  ALKPHOS 65  ALT 35  AST 40  GLUCOSE 100*   No results found for: CKTOTAL, CKMB, CKMBINDEX, TROPONINI Lab Results  Component Value Date   CHOL 173 08/31/2018   CHOL 159 08/15/2013   Lab Results  Component Value Date   HDL 52 08/31/2018   HDL 63 08/15/2013   Lab Results  Component Value Date   LDLCALC 104 (H) 08/31/2018   LDLCALC 86 08/15/2013   Lab Results  Component Value Date   TRIG 86 08/31/2018   TRIG 52 08/15/2013   Lab Results  Component Value Date   CHOLHDL 3.3 08/31/2018   No results found for: LDLDIRECT  No results found for: DDIMER   Radiology/Studies:   Ct Angio Head W Or Wo Contrast Result Date: 08/30/2018 CLINICAL DATA:  Initial evaluation for acute speech difficulty, right facial weakness. EXAM: CT ANGIOGRAPHY HEAD AND NECK CT PERFUSION BRAIN TECHNIQUE: Multidetector CT imaging of the head and neck was performed using the standard protocol during bolus administration of intravenous contrast. Multiplanar CT image reconstructions and MIPs were obtained to evaluate the vascular anatomy. Carotid stenosis measurements (when applicable) are obtained utilizing NASCET criteria, using the distal internal carotid diameter as the denominator. Multiphase CT imaging of the brain was performed following IV bolus contrast injection. Subsequent parametric perfusion maps were calculated using RAPID software. CONTRAST:  194mL ISOVUE-370 IOPAMIDOL (ISOVUE-370) INJECTION 76% COMPARISON:  Prior noncontrast CT from earlier same day. FINDINGS: CTA NECK FINDINGS Aortic arch: Visualized aortic arch of normal caliber with normal branch pattern. No hemodynamically significant stenosis about the origin of the great vessels. Visualized subclavian arteries widely patent. Right carotid system: Right common carotid artery patent from its  origin to the  bifurcation without stenosis. Minimal mixed plaque about the right bifurcation without stenosis. Short-segment mild stenosis of approximately 20% by NASCET criteria just distally within the proximal right ICA. Right ICA otherwise widely patent to the skull base without stenosis, dissection, or occlusion. Left carotid system: Left common carotid artery patent from its origin to the bifurcation without stenosis. No significant atheromatous narrowing about the left bifurcation. Left ICA widely patent from the bifurcation to the skull base without stenosis, dissection, or occlusion. Vertebral arteries: Both of the vertebral arteries arise from the subclavian arteries. Vertebral arteries widely patent within the neck without stenosis, dissection, or occlusion. Skeleton: No acute osseous finding. No discrete lytic or blastic osseous lesions. Mild to moderate cervical spondylolysis at C4-5 through C6-7. Prominent right-sided facet arthrosis noted at C3-4. 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 Soft tissues of the neck demonstrate no other acute finding. Upper chest: Layering fluid noted within the upper esophageal lumen. Visualized upper chest demonstrates no other acute finding. Partially visualized lungs are clear. Review of the MIP images confirms the above findings CTA HEAD FINDINGS Anterior circulation: Petrous ICAs widely patent bilaterally. Cavernous and supraclinoid segments widely patent without stenosis. 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 ICA termini well perfused distally. A1 segments patent bilaterally. Right A1 hypoplastic, likely accounting for the dominant left ICA as compared to the right. Normal anterior communicating artery. Anterior cerebral arteries widely patent to their distal aspects without stenosis. Right M1 widely patent without stenosis. Normal right MCA bifurcation. Distal right MCA branches well perfused. Left M1 patent  proximally. 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 Upon review of prior MRI from 2010, this is likely chronic in nature. Stenosis measures approximately 3 mm in length. Left M1 patent distally. Normal left MCA bifurcation. Left MCA branches are opacified distally, although overall attenuated as compared to the right due to the severe M1 stenosis. Posterior circulation: Vertebral arteries patent to the vertebrobasilar junction without stenosis. Posterior inferior cerebral arteries patent bilaterally. Proximal and mid basilar artery diminutive but widely patent without stenosis. Persistent left-sided trigeminal artery supplies the distal basilar artery which is more robust in caliber. Superior cerebral arteries patent bilaterally. Both of the posterior cerebral arteries primarily supplied via the basilar and are well perfused to their distal aspects. Venous sinuses: Grossly patent, although limited in assessment due to arterial timing the contrast bolus. Anatomic variants: Persistent left trigeminal artery. No intracranial aneurysm. Delayed phase: Not performed. Review of the MIP images confirms the above findings CT Brain Perfusion Findings: CBF (<30%) Volume: 3mL Perfusion (Tmax>6.0s) volume: 170mL Mismatch Volume: 111mL Infarction Location:No core infarct identified by CT perfusion. Large perfusion mismatch throughout the left MCA territory due to the severe left M1 stenosis. IMPRESSION: 1. Negative CTA for large vessel occlusion. No core infarct evident by CT perfusion. 2. Short-segment severe mid left M1 stenosis, likely similar as compared to previous MRI from 11/19/2008. Associated delayed perfusion throughout the left MCA territory distally. 3. No other hemodynamically significant or correctable stenosis within the major arterial vasculature of the head and neck. 4. Persistent left trigeminal artery.  Finding discussed with Dr. Leonel Ramsay by telephone at approximately 9:45 p.m. on 08/30/2018. Electronically Signed   By: Jeannine Boga M.D.   On: 08/30/2018 22:59    Dg Chest 2 View Result Date: 08/30/2018 CLINICAL DATA:  63 year old male with slurred speech and cough today. EXAM: CHEST -  2 VIEW COMPARISON:  08/27/2018 chest radiographs and earlier. FINDINGS: Lower lung volumes on both views. No pneumothorax, pulmonary edema, pleural effusion or consolidation. Mild basilar crowding of markings. Stable cardiac size at the upper limits of normal. Other mediastinal contours are within normal limits. Visualized tracheal air column is within normal limits. No acute osseous abnormality identified. Negative visible bowel gas pattern. Previous ventral abdominal hernia repair with mesh. IMPRESSION: Lower lung volumes, otherwise no acute cardiopulmonary abnormality. Electronically Signed   By: Genevie Ann M.D.   On: 08/30/2018 17:15      Ct Head Wo Contrast Result Date: 09/01/2018 CLINICAL DATA:  Follow-up stroke.  Increased weakness on the right. EXAM: CT HEAD WITHOUT CONTRAST TECHNIQUE: Contiguous axial images were obtained from the base of the skull through the vertex without intravenous contrast. COMPARISON:  Brain MRI from 2 days ago FINDINGS: Brain: Acute infarct in the left deep watershed/lateral lenticulostriate distribution which has a more confluent low-density appearance than seen on prior diffusion imaging. No new territory involvement. No hemorrhagic conversion. Small remote right parietal cortex infarct. No hydrocephalus or masslike finding Vascular: Atherosclerotic calcification Skull: No acute finding Sinuses/Orbits: Right frontal maxillary and ethmoid sinusitis, also seen on prior study. IMPRESSION: More confluent acute infarct in the left basal ganglia and corona radiata when compared to diffusion imaging 2 days ago. No new distribution infarct or hemorrhagic conversion. Electronically Signed    By: Monte Fantasia M.D.   On: 09/01/2018 09:17     Ct Head Wo Contrast Result Date: 08/30/2018 CLINICAL DATA:  63 year old male with abnormal speech onset today. EXAM: CT HEAD WITHOUT CONTRAST TECHNIQUE: Contiguous axial images were obtained from the base of the skull through the vertex without intravenous contrast. COMPARISON:  Brain MRI 11/19/2008. FINDINGS: Brain: A small area of right parietal lobe cortical encephalomalacia is redemonstrated on series 3, image 22. Cerebral volume has not significantly changed. New abnormal hypodensity in the left posterior limb internal capsule and/or lentiform (series 3, image 14). New hypodensity also in the left caudate and anterior lentiform on image 17. No acute intracranial hemorrhage identified. No midline shift, mass effect, or evidence of intracranial mass lesion. No ventriculomegaly. No acute cortically based infarct identified. Incidental tentorial dural calcifications. Vascular: No suspicious intracranial vascular hyperdensity. Skull: Negative. Sinuses/Orbits: Right frontal ethmoid and maxillary sinus disease with fluid levels is new since 2010. The right sphenoid and left paranasal sinuses are spared. Tympanic cavities and mastoids are clear. Other: Mildly Disconjugate gaze, otherwise negative orbits. Visualized scalp soft tissues are within normal limits. IMPRESSION: 1. 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 2. Right OMC pattern paranasal sinusitis. Electronically Signed   By: Genevie Ann M.D.   On: 08/30/2018 17:39      Mr Brain Wo Contrast Result Date: 08/30/2018 CLINICAL DATA:  63 y/o  M; right-sided weakness. EXAM: MRI HEAD WITHOUT CONTRAST TECHNIQUE: Multiplanar, multiecho pulse sequences of the brain and surrounding structures were obtained without intravenous contrast. COMPARISON:  08/30/2018 CT head, CT perfusion head, and CTA head. FINDINGS: Brain: Multiple foci of reduced diffusion are present within the  left caudate body, left mid and posterior corona radiata, left posterior limb of internal capsule extending into left putamen, as well as several additional small foci of the temporoparietal periventricular white matter and the left frontal operculum compatible with acute/early subacute infarction. No associated hemorrhage or mass effect. Infarcts are T2 FLAIR hyperintense. No acute hemorrhage. No extra-axial collection, hydrocephalus, mass effect, or herniation. Small chronic  infarcts in the right parietal cortex and right occipital periventricular white matter. Mild chronic microvascular ischemic changes of the brain. Mild brain parenchymal volume loss. Vascular: Please refer to CT angiogram of the head and neck. Skull and upper cervical spine: Normal marrow signal. Sinuses/Orbits: Right frontal, ethmoid, and maxillary sinus partial opacification and fluid level. No abnormal signal of the mastoid air cells. Orbits are unremarkable. Other: None. IMPRESSION: 1. Multiple small foci of acute/early subacute infarction are present in the left MCA distribution concentrated in left basal ganglia inclusive of the corona radiata and posterior limb of internal capsule. No hemorrhage or mass effect. 2. Mild chronic microvascular ischemic changes and volume loss of the brain. Small chronic infarcts in right parietal and occipital lobes. 3. Right frontal, ethmoid, and maxillary sinus disease is a right middle meatus obstructive pattern, direct visualization recommended. These results were called by telephone at the time of interpretation on 08/30/2018 at 10:46 pm to Dr. Leonel Ramsay, who verbally acknowledged these results. Electronically Signed   By: Kristine Garbe M.D.   On: 08/30/2018 22:48     Dg Hand Complete Right Result Date: 09/03/2018 CLINICAL DATA:  Pt was a recent admit into the hospital for a stroke. Pt fell 4 days ago and caught himself with his right hand. Pt c/o today of right hand pain and swelling  even after the stroke affected the patient's right side. EXAM: RIGHT HAND - COMPLETE 3+ VIEW COMPARISON:  None. FINDINGS: There is subtle density proximal to the triquetrum/the deformed, also identified along the dorsum of the wrist on the LATERAL view. There is significant soft tissue swelling along the dorsum of the hand. IMPRESSION: 1. Soft tissue swelling. 2. Possible fracture of the triquetrum. Electronically Signed   By: Nolon Nations M.D.   On: 09/03/2018 12:49    Vas [place] Transcranial Doppler W Bubbles Result Date: 09/01/2018  Transcranial Doppler with Bubble Indications: Stroke. Performing Technologist: Maudry Mayhew MHA, RDMS, RVT, RDCS  Examination Guidelines: A complete evaluation includes B-mode imaging, spectral Doppler, color Doppler, and power Doppler as needed of all accessible portions of each vessel. Bilateral testing is considered an integral part of a complete examination. Limited examinations for reoccurring indications may be performed as noted.  Summary:  A vascular evaluation was performed. The right middle cerebral artery was studied. An IV was inserted into the patient's left forearm. Verbal informed consent was obtained.  There is no evidence of HITS (high intensity transient signals) at rest or with Valsalva. Therefore, there is no evidence of PFO (patent foramen ovale). *See table(s) above for measurements and observations.    Preliminary     Vas [place] Lower Extremity Venous (dvt) Result Date: 09/01/2018  Lower Venous Study Indications: Stroke.  Performing Technologist: Maudry Mayhew MHA, RDMS, RVT, RDCS  Examination Guidelines: A complete evaluation includes B-mode imaging, spectral Doppler, color Doppler, and power Doppler as needed of all accessible portions of each vessel. Bilateral testing is considered an integral part of a complete examination. Limited examinations for reoccurring indications may be performed as noted.  Right Venous Summary: Right: There is no  evidence of deep vein thrombosis in the lower extremity. However, portions of this examination were limited- see technologist comments above. No cystic structure found in the popliteal fossa. Left: There is no evidence of deep vein thrombosis in the lower extremity. However, portions of this examination were limited- see technologist comments above. No cystic structure found in the popliteal fossa.  Pulsatile lower extremity venous flow is suggestive of possible  elevated right-sided heart pressure. *See table(s) above for measurements and observations. Electronically signed by Monica Martinez MD on 09/01/2018 at 3:44:34 PM.    Final     12-lead ECG SR only, in 2016 he does have EKGs with WPW, not note on prior EKGs or since All prior EKG's in EPIC reviewed with no documented atrial fibrillation  Telemetry S only  Assessment and Plan:  1. Cryptogenic stroke The patient presents with cryptogenic stroke.  The patient has a TEE planned for this afternoon.  I spoke at length with the patient about monitoring for afib with either a 30 day event monitor or an implantable loop recorder.  Risks, benefits, and alteratives to implantable loop recorder were discussed with the patient today.   At this time, the patient is very clear in their decision to proceed with implantable loop recorder, his daughter at bedside reports a number of conversations with neurology team have been had about this as well.   Wound care was reviewed with the patient (keep incision clean and dry for 3 days).  Wound check will be scheduled for the ptient  Please call with questions.   Renee Dyane Dustman, PA-C 09/04/2018   EP Attending  Patient seen and examined. Agree with the findings as noted above. The patient has a h/o WPW with recent loss of pre-excitation who presented after eperiencing a stroke. He has a dense expressive aphasia and right HP.  I would recommend TEE and if negative insertion of an ILR. I have reviewed the  indications, risks, benefits, goals, and expectations and he wishes to proceed.   Mikle Bosworth.D.

## 2018-09-04 NOTE — Discharge Summary (Addendum)
Physician Discharge Summary  Edward Glass:[id number] DOB: 23-Nov-1955 DOA: 08/30/2018  PCP: Lurline Hare, MD  Admit date: 08/30/2018 Discharge date: 09/05/2018  Admitted From: home  Disposition:  CIR   Recommendations for Outpatient Follow-up:  1. Neuro recommends 325 mg Aspirin and 75 mg Plavix x 3 months and then Plavix alone 2. F/u on Loop recorder 3. F/u with Neurology in 4-6 wks.  4. F/u with Dr Stann Mainland (ortho) in 2-3 wks for fracture of wrist.     Discharge Condition:  stable   CODE STATUS:  Full code   Diet recommendation:  Heart healthy, carb modified Consultations:  neurology   Cardiology  Ortho (phone consult)   Discharge Diagnoses:  Principal Problem:   CVA (cerebral vascular accident) ([place]) Active Problems:  PFO   Fracture in right wrist (Triquetrum)   Hypogonadism male      Brief Summary: Edward Glass is a 63 y.o.malewith medical history significant ofhypogonadism, possible BPH, who presents with   difficulty speaking, right facial droop and right arm and leg weakness.   He was not a candidate for TPA. Given Aspirin and a Plavix load.   Hospital Course:  Principal Problem:   CVA (cerebral vascular accident)- right visual field cut, right facial droop, right arm/ leg weaknes, dysarthria, mild receptive aphasia - CTA head/neck> Short-segment severe mid left M1 stenosis, likely similar as compared to previous MRI from 11/19/2008. - MRI> Multiple small foci of acute/early subacute infarction are present in the left MCA distribution concentrated in left basal ganglia inclusive of the corona radiata and posterior limb of internal capsule. - LDL 104- Lipitor added - A1c 6.2- discuss diet control and repeat A1c in about 1 month-   - Edward Glass eval completed- recommended CIR -   - 2 D ECHO shows no thrombus -2/7, symptoms noted to have progressed to where he can no longer move right arm or leg - CT head> More confluent acute infarct in the left  basal ganglia and corona radiata - per MBS and SLP eval, he needs mechanical soft with nectar thick liquids at this point due to oropharyngeal dysphagia- meds crushed with pureed - Neuro suspecting embolic infarct- TEE done- no thrombus noted but a small PFO suspected- see report below   Active Problems: Right wrist fracture - noted to have right wrist swelling and pain on movement and palpation on 09/03/18-  - Xray reveals a "Possible fracture of the triquetrum"- I spoke with Dr Stann Mainland who recommends, Ice, NSAIDs and a brace to help reduce pain- f/u for xrays in 2-3 weeks -  d/w patient and family  BPH - Alfuzosin continued  Hypogonadism    Discharge Exam: Vitals:   09/04/18 2343 09/05/18 0412  BP: 135/76 134/77  Pulse: 71 65  Resp: 17 18  Temp: 99.8 F (37.7 C)   SpO2: 96% 95%   Vitals:   09/04/18 1630 09/04/18 1939 09/04/18 2343 09/05/18 0412  BP: 136/82 127/88 135/76 134/77  Pulse: 69 71 71 65  Resp: 18 17 17 18   Temp:  98.7 F (37.1 C) 99.8 F (37.7 C)   TempSrc:  Oral Oral   SpO2: 96% 96% 96% 95%  Weight:      Height:        General: Edward Glass is alert, awake, not in acute distress Cardiovascular: RRR, S1/S2 +, no rubs, no gallops Respiratory: CTA bilaterally, no wheezing, no rhonchi Abdominal: Soft, NT, ND, bowel sounds + Extremities: no edema, no cyanosis   Discharge Instructions  Discharge Instructions    Ambulatory referral to Neurology   Complete by:  As directed    Follow up with stroke clinic NP (Jessica Vanschaick or Cecille Rubin, if both not available, consider Zachery Dauer, or [facility]) at [facility] in about 4 weeks. Thanks.   Diet - low sodium heart healthy   Complete by:  As directed    Increase activity slowly   Complete by:  As directed      Allergies as of 09/05/2018   No Known Allergies     Medication List    STOP taking these medications   aspirin 81 MG tablet Replaced by:  aspirin 325 MG EC tablet   MILK THISTLE PO   sildenafil 20  MG tablet Commonly known as:  REVATIO     TAKE these medications   alfuzosin 10 MG 24 hr tablet Commonly known as:  UROXATRAL Take 10 mg by mouth 3 (three) times a week.   aspirin 325 MG EC tablet Take 1 tablet (325 mg total) by mouth daily. Replaces:  aspirin 81 MG tablet   atorvastatin 40 MG tablet Commonly known as:  LIPITOR Take 1 tablet (40 mg total) by mouth daily at 6 PM.   clopidogrel 75 MG tablet Commonly known as:  PLAVIX Take 1 tablet (75 mg total) by mouth daily.   multivitamin Tabs tablet Take 1 tablet by mouth daily.   Saw Palmetto Caps Take 2-4 capsules by mouth daily.      Follow-up Information    [facility].   Specialty:  Cardiology Why:  09/14/2018 @ 10:30AM, woound check visit Contact information: [address], [place] [phone]       [facility]. Schedule an appointment as soon as possible for a visit in 4 week(s).   Specialty:  Neurology Contact information: [address] [place] [phone]         No Known Allergies   Procedures/Studies: TTE  1. The left ventricle has normal systolic function of 58-52%. The cavity size was normal. There is no increased left ventricular wall thickness. Echo evidence of impaired diastolic relaxation. 2. The right ventricle has normal systolic function. The cavity was normal. There is no increase in right ventricular wall thickness. 3. The mitral valve is normal in structure. No evidence of mitral valve stenosis. No significant mitral regurgitation. 4. The tricuspid valve is normal in structure. 5. The aortic valve is tricuspid There is mild calcification of the aortic valve. No aortic stenosis. 6. The pulmonic valve was normal in structure. 7. The aortic root and ascending aorta are normal in size and structure. 8. No evidence of left ventricular regional wall  motion abnormalities. 9. The inferior vena cava is normal in size with greater than 50% respiratory variability. 10. No complete TR doppler jet so unable to estimate PA systolic pressure.  TEE>  Small PFO/bubble crossover noted during Valsalva.  No embolic source identified.   Ct Angio Head W Or Wo Contrast  Result Date: 08/30/2018 CLINICAL DATA:  Initial evaluation for acute speech difficulty, right facial weakness. EXAM: CT ANGIOGRAPHY HEAD AND NECK CT PERFUSION Glass TECHNIQUE: Multidetector CT imaging of the head and neck was performed using the standard protocol during bolus administration of intravenous contrast. Multiplanar CT image reconstructions and MIPs were obtained to evaluate the vascular anatomy. Carotid stenosis measurements (when applicable) are obtained utilizing NASCET criteria, using the distal internal carotid diameter as the  denominator. Multiphase CT imaging of the Glass was performed following IV bolus contrast injection. Subsequent parametric perfusion maps were calculated using RAPID software. CONTRAST:  158mL ISOVUE-370 IOPAMIDOL (ISOVUE-370) INJECTION 76% COMPARISON:  Prior noncontrast CT from earlier same day. FINDINGS: CTA NECK FINDINGS Aortic arch: Visualized aortic arch of normal caliber with normal branch pattern. No hemodynamically significant stenosis about the origin of the great vessels. Visualized subclavian arteries widely patent. Right carotid system: Right common carotid artery patent from its origin to the bifurcation without stenosis. Minimal mixed plaque about the right bifurcation without stenosis. Short-segment mild stenosis of approximately 20% by NASCET criteria just distally within the proximal right ICA. Right ICA otherwise widely patent to the skull base without stenosis, dissection, or occlusion. Left carotid system: Left common carotid artery patent from its origin to the bifurcation without stenosis. No significant atheromatous narrowing about the left  bifurcation. Left ICA widely patent from the bifurcation to the skull base without stenosis, dissection, or occlusion. Vertebral arteries: Both of the vertebral arteries arise from the subclavian arteries. Vertebral arteries widely patent within the neck without stenosis, dissection, or occlusion. Skeleton: No acute osseous finding. No discrete lytic or blastic osseous lesions. Mild to moderate cervical spondylolysis at C4-5 through C6-7. Prominent right-sided facet arthrosis noted at C3-4. 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 Soft tissues of the neck demonstrate no other acute finding. Upper chest: Layering fluid noted within the upper esophageal lumen. Visualized upper chest demonstrates no other acute finding. Partially visualized lungs are clear. Review of the MIP images confirms the above findings CTA HEAD FINDINGS Anterior circulation: Petrous ICAs widely patent bilaterally. Cavernous and supraclinoid segments widely patent without stenosis. 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 ICA termini well perfused distally. A1 segments patent bilaterally. Right A1 hypoplastic, likely accounting for the dominant left ICA as compared to the right. Normal anterior communicating artery. Anterior cerebral arteries widely patent to their distal aspects without stenosis. Right M1 widely patent without stenosis. Normal right MCA bifurcation. Distal right MCA branches well perfused. Left M1 patent proximally. 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 Upon review of prior MRI from 2010, this is likely chronic in nature. Stenosis measures approximately 3 mm in length. Left M1 patent distally. Normal left MCA bifurcation. Left MCA branches are opacified distally, although overall attenuated as compared to the right due to the severe M1 stenosis. Posterior circulation:  Vertebral arteries patent to the vertebrobasilar junction without stenosis. Posterior inferior cerebral arteries patent bilaterally. Proximal and mid basilar artery diminutive but widely patent without stenosis. Persistent left-sided trigeminal artery supplies the distal basilar artery which is more robust in caliber. Superior cerebral arteries patent bilaterally. Both of the posterior cerebral arteries primarily supplied via the basilar and are well perfused to their distal aspects. Venous sinuses: Grossly patent, although limited in assessment due to arterial timing the contrast bolus. Anatomic variants: Persistent left trigeminal artery. No intracranial aneurysm. Delayed phase: Not performed. Review of the MIP images confirms the above findings CT Glass Perfusion Findings: CBF (<30%) Volume: 10mL Perfusion (Tmax>6.0s) volume: 120mL Mismatch Volume: 170mL Infarction Location:No core infarct identified by CT perfusion. Large perfusion mismatch throughout the left MCA territory due to the severe left M1 stenosis. IMPRESSION: 1. Negative CTA for large vessel occlusion. No core infarct evident by CT perfusion. 2. Short-segment severe mid left M1 stenosis, likely similar as compared to previous MRI  from 11/19/2008. Associated delayed perfusion throughout the left MCA territory distally. 3. No other hemodynamically significant or correctable stenosis within the major arterial vasculature of the head and neck. 4. Persistent left trigeminal artery. Finding discussed with Dr. Leonel Ramsay by telephone at approximately 9:45 p.m. on 08/30/2018. Electronically Signed   By: Jeannine Boga M.D.   On: 08/30/2018 22:59   Dg Chest 2 View  Result Date: 08/30/2018 CLINICAL DATA:  63 year old male with slurred speech and cough today. EXAM: CHEST - 2 VIEW COMPARISON:  08/27/2018 chest radiographs and earlier. FINDINGS: Lower lung volumes on both views. No pneumothorax, pulmonary edema, pleural effusion or consolidation. Mild  basilar crowding of markings. Stable cardiac size at the upper limits of normal. Other mediastinal contours are within normal limits. Visualized tracheal air column is within normal limits. No acute osseous abnormality identified. Negative visible bowel gas pattern. Previous ventral abdominal hernia repair with mesh. IMPRESSION: Lower lung volumes, otherwise no acute cardiopulmonary abnormality. Electronically Signed   By: Genevie Ann M.D.   On: 08/30/2018 17:15   Dg Chest 2 View  Result Date: 08/27/2018 CLINICAL DATA:  Dizziness for 1 day. EXAM: CHEST - 2 VIEW COMPARISON:  Chest CT, 08/14/2013 FINDINGS: The heart size and mediastinal contours are within normal limits. Both lungs are clear. No pleural effusion or pneumothorax. The visualized skeletal structures are unremarkable. IMPRESSION: No active cardiopulmonary disease. Electronically Signed   By: Lajean Manes M.D.   On: 08/27/2018 12:40   Ct Head Wo Contrast  Result Date: 09/01/2018 CLINICAL DATA:  Follow-up stroke.  Increased weakness on the right. EXAM: CT HEAD WITHOUT CONTRAST TECHNIQUE: Contiguous axial images were obtained from the base of the skull through the vertex without intravenous contrast. COMPARISON:  Glass MRI from 2 days ago FINDINGS: Glass: Acute infarct in the left deep watershed/lateral lenticulostriate distribution which has a more confluent low-density appearance than seen on prior diffusion imaging. No new territory involvement. No hemorrhagic conversion. Small remote right parietal cortex infarct. No hydrocephalus or masslike finding Vascular: Atherosclerotic calcification Skull: No acute finding Sinuses/Orbits: Right frontal maxillary and ethmoid sinusitis, also seen on prior study. IMPRESSION: More confluent acute infarct in the left basal ganglia and corona radiata when compared to diffusion imaging 2 days ago. No new distribution infarct or hemorrhagic conversion. Electronically Signed   By: Monte Fantasia M.D.   On: 09/01/2018  09:17   Ct Head Wo Contrast  Result Date: 08/30/2018 CLINICAL DATA:  63 year old male with abnormal speech onset today. EXAM: CT HEAD WITHOUT CONTRAST TECHNIQUE: Contiguous axial images were obtained from the base of the skull through the vertex without intravenous contrast. COMPARISON:  Glass MRI 11/19/2008. FINDINGS: Glass: A small area of right parietal lobe cortical encephalomalacia is redemonstrated on series 3, image 22. Cerebral volume has not significantly changed. New abnormal hypodensity in the left posterior limb internal capsule and/or lentiform (series 3, image 14). New hypodensity also in the left caudate and anterior lentiform on image 17. No acute intracranial hemorrhage identified. No midline shift, mass effect, or evidence of intracranial mass lesion. No ventriculomegaly. No acute cortically based infarct identified. Incidental tentorial dural calcifications. Vascular: No suspicious intracranial vascular hyperdensity. Skull: Negative. Sinuses/Orbits: Right frontal ethmoid and maxillary sinus disease with fluid levels is new since 2010. The right sphenoid and left paranasal sinuses are spared. Tympanic cavities and mastoids are clear. Other: Mildly Disconjugate gaze, otherwise negative orbits. Visualized scalp soft tissues are within normal limits. IMPRESSION: 1. Age indeterminate small vessel ischemia in the left basal  ganglia, new since 2010. 2. Right [facility] pattern paranasal sinusitis. Electronically Signed   By: Genevie Ann M.D.   On: 08/30/2018 17:39   Ct Angio Neck W Or Wo Contrast  Result Date: 08/30/2018 CLINICAL DATA:  Initial evaluation for acute speech difficulty, right facial weakness. EXAM: CT ANGIOGRAPHY HEAD AND NECK CT PERFUSION Glass TECHNIQUE: Multidetector CT imaging of the head and neck was performed using the standard protocol during bolus administration of intravenous contrast. Multiplanar CT image reconstructions and MIPs were obtained to evaluate the vascular anatomy. Carotid  stenosis measurements (when applicable) are obtained utilizing NASCET criteria, using the distal internal carotid diameter as the denominator. Multiphase CT imaging of the Glass was performed following IV bolus contrast injection. Subsequent parametric perfusion maps were calculated using RAPID software. CONTRAST:  12mL ISOVUE-370 IOPAMIDOL (ISOVUE-370) INJECTION 76% COMPARISON:  Prior noncontrast CT from earlier same day. FINDINGS: CTA NECK FINDINGS Aortic arch: Visualized aortic arch of normal caliber with normal branch pattern. No hemodynamically significant stenosis about the origin of the great vessels. Visualized subclavian arteries widely patent. Right carotid system: Right common carotid artery patent from its origin to the bifurcation without stenosis. Minimal mixed plaque about the right bifurcation without stenosis. Short-segment mild stenosis of approximately 20% by NASCET criteria just distally within the proximal right ICA. Right ICA otherwise widely patent to the skull base without stenosis, dissection, or occlusion. Left carotid system: Left common carotid artery patent from its origin to the bifurcation without stenosis. No significant atheromatous narrowing about the left bifurcation. Left ICA widely patent from the bifurcation to the skull base without stenosis, dissection, or occlusion. Vertebral arteries: Both of the vertebral arteries arise from the subclavian arteries. Vertebral arteries widely patent within the neck without stenosis, dissection, or occlusion. Skeleton: No acute osseous finding. No discrete lytic or blastic osseous lesions. Mild to moderate cervical spondylolysis at C4-5 through C6-7. Prominent right-sided facet arthrosis noted at C3-4. 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 Soft tissues of the neck demonstrate no other acute finding. Upper chest: Layering fluid noted within the upper esophageal lumen. Visualized upper chest demonstrates no other acute  finding. Partially visualized lungs are clear. Review of the MIP images confirms the above findings CTA HEAD FINDINGS Anterior circulation: Petrous ICAs widely patent bilaterally. Cavernous and supraclinoid segments widely patent without stenosis. 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 ICA termini well perfused distally. A1 segments patent bilaterally. Right A1 hypoplastic, likely accounting for the dominant left ICA as compared to the right. Normal anterior communicating artery. Anterior cerebral arteries widely patent to their distal aspects without stenosis. Right M1 widely patent without stenosis. Normal right MCA bifurcation. Distal right MCA branches well perfused. Left M1 patent proximally. 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 Upon review of prior MRI from 2010, this is likely chronic in nature. Stenosis measures approximately 3 mm in length. Left M1 patent distally. Normal left MCA bifurcation. Left MCA branches are opacified distally, although overall attenuated as compared to the right due to the severe M1 stenosis. Posterior circulation: Vertebral arteries patent to the vertebrobasilar junction without stenosis. Posterior inferior cerebral arteries patent bilaterally. Proximal and mid basilar artery diminutive but widely patent without stenosis. Persistent left-sided trigeminal artery supplies the distal basilar artery which is more robust in caliber. Superior cerebral arteries patent bilaterally. Both of the posterior cerebral arteries primarily supplied via the basilar and are  well perfused to their distal aspects. Venous sinuses: Grossly patent, although limited in assessment due to arterial timing the contrast bolus. Anatomic variants: Persistent left trigeminal artery. No intracranial aneurysm. Delayed phase: Not performed. Review of the MIP images confirms  the above findings CT Glass Perfusion Findings: CBF (<30%) Volume: 29mL Perfusion (Tmax>6.0s) volume: 13mL Mismatch Volume: 170mL Infarction Location:No core infarct identified by CT perfusion. Large perfusion mismatch throughout the left MCA territory due to the severe left M1 stenosis. IMPRESSION: 1. Negative CTA for large vessel occlusion. No core infarct evident by CT perfusion. 2. Short-segment severe mid left M1 stenosis, likely similar as compared to previous MRI from 11/19/2008. Associated delayed perfusion throughout the left MCA territory distally. 3. No other hemodynamically significant or correctable stenosis within the major arterial vasculature of the head and neck. 4. Persistent left trigeminal artery. Finding discussed with Dr. Leonel Ramsay by telephone at approximately 9:45 p.m. on 08/30/2018. Electronically Signed   By: Jeannine Boga M.D.   On: 08/30/2018 22:59   Edward Glass Wo Contrast  Result Date: 08/30/2018 CLINICAL DATA:  63 y/o  M; right-sided weakness. EXAM: MRI HEAD WITHOUT CONTRAST TECHNIQUE: Multiplanar, multiecho pulse sequences of the Glass and surrounding structures were obtained without intravenous contrast. COMPARISON:  08/30/2018 CT head, CT perfusion head, and CTA head. FINDINGS: Glass: Multiple foci of reduced diffusion are present within the left caudate body, left mid and posterior corona radiata, left posterior limb of internal capsule extending into left putamen, as well as several additional small foci of the temporoparietal periventricular white matter and the left frontal operculum compatible with acute/early subacute infarction. No associated hemorrhage or mass effect. Infarcts are T2 FLAIR hyperintense. No acute hemorrhage. No extra-axial collection, hydrocephalus, mass effect, or herniation. Small chronic infarcts in the right parietal cortex and right occipital periventricular white matter. Mild chronic microvascular ischemic changes of the Glass. Mild Glass  parenchymal volume loss. Vascular: Please refer to CT angiogram of the head and neck. Skull and upper cervical spine: Normal marrow signal. Sinuses/Orbits: Right frontal, ethmoid, and maxillary sinus partial opacification and fluid level. No abnormal signal of the mastoid air cells. Orbits are unremarkable. Other: None. IMPRESSION: 1. Multiple small foci of acute/early subacute infarction are present in the left MCA distribution concentrated in left basal ganglia inclusive of the corona radiata and posterior limb of internal capsule. No hemorrhage or mass effect. 2. Mild chronic microvascular ischemic changes and volume loss of the Glass. Small chronic infarcts in right parietal and occipital lobes. 3. Right frontal, ethmoid, and maxillary sinus disease is a right middle meatus obstructive pattern, direct visualization recommended. These results were called by telephone at the time of interpretation on 08/30/2018 at 10:46 pm to Dr. Leonel Ramsay, who verbally acknowledged these results. Electronically Signed   By: Kristine Garbe M.D.   On: 08/30/2018 22:48   Ct Cerebral Perfusion W Contrast  Result Date: 08/30/2018 CLINICAL DATA:  Initial evaluation for acute speech difficulty, right facial weakness. EXAM: CT ANGIOGRAPHY HEAD AND NECK CT PERFUSION Glass TECHNIQUE: Multidetector CT imaging of the head and neck was performed using the standard protocol during bolus administration of intravenous contrast. Multiplanar CT image reconstructions and MIPs were obtained to evaluate the vascular anatomy. Carotid stenosis measurements (when applicable) are obtained utilizing NASCET criteria, using the distal internal carotid diameter as the denominator. Multiphase CT imaging of the Glass was performed following IV bolus contrast injection. Subsequent parametric perfusion maps were calculated using RAPID software. CONTRAST:  123mL ISOVUE-370 IOPAMIDOL (ISOVUE-370) INJECTION 76% COMPARISON:  Prior noncontrast CT from  earlier same day. FINDINGS: CTA NECK FINDINGS Aortic arch: Visualized aortic arch of normal caliber with normal branch pattern. No hemodynamically significant stenosis about the origin of the great vessels. Visualized subclavian arteries widely patent. Right carotid system: Right common carotid artery patent from its origin to the bifurcation without stenosis. Minimal mixed plaque about the right bifurcation without stenosis. Short-segment mild stenosis of approximately 20% by NASCET criteria just distally within the proximal right ICA. Right ICA otherwise widely patent to the skull base without stenosis, dissection, or occlusion. Left carotid system: Left common carotid artery patent from its origin to the bifurcation without stenosis. No significant atheromatous narrowing about the left bifurcation. Left ICA widely patent from the bifurcation to the skull base without stenosis, dissection, or occlusion. Vertebral arteries: Both of the vertebral arteries arise from the subclavian arteries. Vertebral arteries widely patent within the neck without stenosis, dissection, or occlusion. Skeleton: No acute osseous finding. No discrete lytic or blastic osseous lesions. Mild to moderate cervical spondylolysis at C4-5 through C6-7. Prominent right-sided facet arthrosis noted at C3-4. 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 Soft tissues of the neck demonstrate no other acute finding. Upper chest: Layering fluid noted within the upper esophageal lumen. Visualized upper chest demonstrates no other acute finding. Partially visualized lungs are clear. Review of the MIP images confirms the above findings CTA HEAD FINDINGS Anterior circulation: Petrous ICAs widely patent bilaterally. Cavernous and supraclinoid segments widely patent without stenosis. 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 ICA termini well perfused distally. A1 segments patent bilaterally. Right A1 hypoplastic, likely  accounting for the dominant left ICA as compared to the right. Normal anterior communicating artery. Anterior cerebral arteries widely patent to their distal aspects without stenosis. Right M1 widely patent without stenosis. Normal right MCA bifurcation. Distal right MCA branches well perfused. Left M1 patent proximally. 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 Upon review of prior MRI from 2010, this is likely chronic in nature. Stenosis measures approximately 3 mm in length. Left M1 patent distally. Normal left MCA bifurcation. Left MCA branches are opacified distally, although overall attenuated as compared to the right due to the severe M1 stenosis. Posterior circulation: Vertebral arteries patent to the vertebrobasilar junction without stenosis. Posterior inferior cerebral arteries patent bilaterally. Proximal and mid basilar artery diminutive but widely patent without stenosis. Persistent left-sided trigeminal artery supplies the distal basilar artery which is more robust in caliber. Superior cerebral arteries patent bilaterally. Both of the posterior cerebral arteries primarily supplied via the basilar and are well perfused to their distal aspects. Venous sinuses: Grossly patent, although limited in assessment due to arterial timing the contrast bolus. Anatomic variants: Persistent left trigeminal artery. No intracranial aneurysm. Delayed phase: Not performed. Review of the MIP images confirms the above findings CT Glass Perfusion Findings: CBF (<30%) Volume: 60mL Perfusion (Tmax>6.0s) volume: 156mL Mismatch Volume: 173mL Infarction Location:No core infarct identified by CT perfusion. Large perfusion mismatch throughout the left MCA territory due to the severe left M1 stenosis. IMPRESSION: 1. Negative CTA for large vessel occlusion. No core infarct evident by CT perfusion. 2. Short-segment severe mid  left M1 stenosis, likely similar as compared to previous MRI from 11/19/2008. Associated delayed perfusion throughout the left MCA territory distally. 3. No other hemodynamically significant or correctable stenosis within the major arterial vasculature of the head and neck. 4. Persistent left  trigeminal artery. Finding discussed with Dr. Leonel Ramsay by telephone at approximately 9:45 p.m. on 08/30/2018. Electronically Signed   By: Jeannine Boga M.D.   On: 08/30/2018 22:59   Dg Hand Complete Right  Result Date: 09/03/2018 CLINICAL DATA:  Edward Glass was a recent admit into the hospital for a stroke. Edward Glass fell 4 days ago and caught himself with his right hand. Edward Glass c/o today of right hand pain and swelling even after the stroke affected the patient's right side. EXAM: RIGHT HAND - COMPLETE 3+ VIEW COMPARISON:  None. FINDINGS: There is subtle density proximal to the triquetrum/the deformed, also identified along the dorsum of the wrist on the LATERAL view. There is significant soft tissue swelling along the dorsum of the hand. IMPRESSION: 1. Soft tissue swelling. 2. Possible fracture of the triquetrum. Electronically Signed   By: Nolon Nations M.D.   On: 09/03/2018 12:49   Vas [place] Transcranial Doppler W Bubbles  Result Date: 09/04/2018  Transcranial Doppler with Bubble Indications: Stroke. Performing Technologist: Maudry Mayhew MHA, RDMS, RVT, RDCS  Examination Guidelines: A complete evaluation includes B-mode imaging, spectral Doppler, color Doppler, and power Doppler as needed of all accessible portions of each vessel. Bilateral testing is considered an integral part of a complete examination. Limited examinations for reoccurring indications may be performed as noted.  Summary:  A vascular evaluation was performed. The right middle cerebral artery was studied. An IV was inserted into the patient's left forearm. Verbal informed consent was obtained.  There is no evidence of HITS (high intensity transient  signals) at rest or with Valsalva. Therefore, there is no evidence of PFO (patent foramen ovale). Negative TCD Bubble study *See table(s) above for measurements and observations.  Diagnosing physician: Antony Contras MD Electronically signed by Antony Contras MD on 09/04/2018 at 1:11:51 PM.    Final    Vas [place] Lower Extremity Venous (dvt)  Result Date: 09/01/2018  Lower Venous Study Indications: Stroke.  Performing Technologist: Maudry Mayhew MHA, RDMS, RVT, RDCS  Examination Guidelines: A complete evaluation includes B-mode imaging, spectral Doppler, color Doppler, and power Doppler as needed of all accessible portions of each vessel. Bilateral testing is considered an integral part of a complete examination. Limited examinations for reoccurring indications may be performed as noted.  Right Venous Findings: +---------+---------------+---------+-----------+----------+--------------+          CompressibilityPhasicitySpontaneityPropertiesSummary        +---------+---------------+---------+-----------+----------+--------------+ CFV      Full           Yes      Yes                                 +---------+---------------+---------+-----------+----------+--------------+ SFJ      Full                                                        +---------+---------------+---------+-----------+----------+--------------+ FV Prox  Full                                                        +---------+---------------+---------+-----------+----------+--------------+ FV Mid   Full                                                        +---------+---------------+---------+-----------+----------+--------------+  FV DistalFull                                                        +---------+---------------+---------+-----------+----------+--------------+ PFV      Full                                                         +---------+---------------+---------+-----------+----------+--------------+ POP      Full                    Yes                  Rouleaux flow  +---------+---------------+---------+-----------+----------+--------------+ PTV      Full                                                        +---------+---------------+---------+-----------+----------+--------------+ PERO                                                  Not visualized +---------+---------------+---------+-----------+----------+--------------+  Left Venous Findings: +---------+---------------+---------+-----------+----------+-------+          CompressibilityPhasicitySpontaneityPropertiesSummary +---------+---------------+---------+-----------+----------+-------+ CFV      Full           No       Yes                          +---------+---------------+---------+-----------+----------+-------+ SFJ      Full                                                 +---------+---------------+---------+-----------+----------+-------+ FV Prox  Full                                                 +---------+---------------+---------+-----------+----------+-------+ FV Mid   Full                                                 +---------+---------------+---------+-----------+----------+-------+ FV DistalFull                                                 +---------+---------------+---------+-----------+----------+-------+ PFV      Full                                                 +---------+---------------+---------+-----------+----------+-------+  POP      Full                    Yes                          +---------+---------------+---------+-----------+----------+-------+ PTV      Full                                                 +---------+---------------+---------+-----------+----------+-------+ PERO     Full                                                  +---------+---------------+---------+-----------+----------+-------+    Summary: Right: There is no evidence of deep vein thrombosis in the lower extremity. However, portions of this examination were limited- see technologist comments above. No cystic structure found in the popliteal fossa. Left: There is no evidence of deep vein thrombosis in the lower extremity. However, portions of this examination were limited- see technologist comments above. No cystic structure found in the popliteal fossa.  Pulsatile lower extremity venous flow is suggestive of possible elevated right-sided heart pressure. *See table(s) above for measurements and observations. Electronically signed by Edward Martinez MD on 09/01/2018 at 3:44:34 PM.    Final      The results of significant diagnostics from this hospitalization (including imaging, microbiology, ancillary and laboratory) are listed below for reference.     Microbiology: No results found for this or any previous visit (from the past 240 hour(s)).   Labs: BNP (last 3 results) No results for input(s): BNP in the last 8760 hours. Basic Metabolic Panel: Recent Labs  Lab 08/30/18 1546 09/04/18 1944  NA 140  --   K 3.7  --   CL 105  --   CO2 23  --   GLUCOSE 100*  --   BUN 17  --   CREATININE 0.91 0.91  CALCIUM 9.4  --    Liver Function Tests: Recent Labs  Lab 08/30/18 1546  AST 40  ALT 35  ALKPHOS 65  BILITOT 0.4  PROT 7.1  ALBUMIN 3.8   No results for input(s): LIPASE, AMYLASE in the last 168 hours. No results for input(s): AMMONIA in the last 168 hours. CBC: Recent Labs  Lab 08/30/18 1546 09/04/18 1944  WBC 6.5 9.9  NEUTROABS 3.8  --   HGB 15.4 16.2  HCT 48.5 49.5  MCV 91.9 89.5  PLT 269 349   Cardiac Enzymes: No results for input(s): CKTOTAL, CKMB, CKMBINDEX, TROPONINI in the last 168 hours. BNP: Invalid input(s): POCBNP CBG: No results for input(s): GLUCAP in the last 168 hours. D-Dimer No results for input(s): DDIMER in  the last 72 hours. Hgb A1c No results for input(s): HGBA1C in the last 72 hours. Lipid Profile No results for input(s): CHOL, HDL, LDLCALC, TRIG, CHOLHDL, LDLDIRECT in the last 72 hours. Thyroid function studies No results for input(s): TSH, T4TOTAL, T3FREE, THYROIDAB in the last 72 hours.  Invalid input(s): FREET3 Anemia work up No results for input(s): VITAMINB12, FOLATE, FERRITIN, TIBC, IRON, RETICCTPCT in the last 72 hours. Urinalysis    Component Value Date/Time   COLORURINE STRAW (A) 08/30/2018 1801  APPEARANCEUR CLEAR 08/30/2018 1801   LABSPEC 1.014 08/30/2018 1801   PHURINE 5.0 08/30/2018 1801   GLUCOSEU NEGATIVE 08/30/2018 1801   HGBUR SMALL (A) 08/30/2018 1801   BILIRUBINUR NEGATIVE 08/30/2018 1801   BILIRUBINUR neg 10/03/2013 1529   KETONESUR NEGATIVE 08/30/2018 1801   PROTEINUR NEGATIVE 08/30/2018 1801   UROBILINOGEN 0.2 10/03/2013 1529   NITRITE NEGATIVE 08/30/2018 1801   LEUKOCYTESUR NEGATIVE 08/30/2018 1801   Sepsis Labs Invalid input(s): PROCALCITONIN,  WBC,  LACTICIDVEN Microbiology No results found for this or any previous visit (from the past 240 hour(s)).   Time coordinating discharge in minutes: 65  SIGNED:   Debbe Odea, MD  [facility] 09/05/2018, 7:47 AM Pager   If 7PM-7AM, please contact night-coverage [email or web address] Password [email or web address]

## 2018-09-04 NOTE — Progress Notes (Signed)
Physical Therapy Treatment Patient Details Name: Edward Glass MRN: [id number] DOB: 1956-07-08 Today's Date: 09/04/2018    History of Present Illness Patient is a 63 y/o male presenting to the ED on 08/30/2018 with primary complaints of aphasia, R facila droop and R sided weakness. CT revealing Age indeterminate small vessel ischemia in the left basal ganglia. 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 PMH significant for hypogonadism, possible BPH. Of note, pt with worsening symptoms on the morning of 2/7. CT scan of the head was obtained which shows slight extension of left MCA infarct but no hemorrhage or new unexpected finding.    PT Comments    Pt performed [name] supine exercises to improve strength and function in [name].  Pt participated in rolling to L side x 5 reps with cues for body position and technique and assistance to achieve L sidelying.  PTA dovetailed OT session and focused on in bed level activities as patient is awaiting TEE this pm.  Plan next session for progression of transfers and LE strengthening to tolerance.    Follow Up Recommendations  CIR     Equipment Recommendations  None recommended by PT    Recommendations for Other Services Rehab consult;OT consult;Speech consult     Precautions / Restrictions Precautions Precautions: Fall Precaution Comments: R hemiplegia Restrictions Weight Bearing Restrictions: No    Mobility  Bed Mobility Overal bed mobility: Needs Assistance Bed Mobility: Rolling Rolling: Max assist;Mod assist   Supine to sit: Max assist Sit to supine: Mod assist   General bed mobility comments: Performed x 5 reps of rolling to L side.  Pt required Hand over hand placement to assist R UE.  Pt required cues to look R and PTA assisted [name] into hooklying to push through R LE  to roll to the Left side.  mod-max assistance.    Transfers Overall transfer level: Needs assistance Equipment used: 2 person hand held assist Transfers: Sit to/from [facility] Sit to Stand: Mod assist Stand pivot transfers: Max assist          Ambulation/Gait                 Stairs             Wheelchair Mobility    Modified Rankin (Stroke Patients Only)       Balance Overall balance assessment: Needs assistance Sitting-balance support: Feet supported Sitting balance-Leahy Scale: Fair Sitting balance - Comments: Pt able to maintain upright midline sitting at EOB with supervision, min assist when completing reaching tasks     Standing balance-Leahy Scale: Poor Standing balance comment: Mod assist for static standing balance with max assist for stepping.                            Cognition Arousal/Alertness: Awake/alert Behavior During Therapy: Flat affect Overall Cognitive Status: Impaired/Different from baseline Area of Impairment: Following commands;Problem solving                       Following Commands: Follows one step commands inconsistently(receptive difficulties vs. apraxia)       General Comments: difficult to assess due to aphasia; pt able to follow simple one-step commands but required frequent verbal and tactile cueing      Exercises General Exercises -  Lower Extremity Ankle Circles/Pumps: AROM;PROM;Both;15 reps;Supine Quad Sets: Right;AROM(attempted quad sets on R side, trace contraction x1 then unable to achieve anymore due to ability to follow commands.  ) Heel Slides: AAROM;Right;10 reps;Supine(able to initiate movement into ROM.  Assistance to complete ROM. ) Hip ABduction/ADduction: AAROM;Right;10 reps;Supine Other Exercises Other Exercises: Clam shell hib abd and add on [name] from R hooklying.  AAROM 1x10 reps.   Other Exercises: Bridging B LEs/hips 1x15 reps.      General Comments         Pertinent Vitals/Pain Pain Assessment: No/denies pain Faces Pain Scale: Hurts a little bit Pain Location: right wrist  Pain Descriptors / Indicators: Discomfort Pain Intervention(s): Repositioned    Home Living                      Prior Function            PT Goals (current goals can now be found in the care plan section) Acute Rehab PT Goals Patient Stated Goal: none stated, wife goal is to get procedure done this pm so he can eat. Potential to Achieve Goals: Good Progress towards PT goals: Progressing toward goals    Frequency    Min 4X/week      PT Plan Current plan remains appropriate    Co-evaluation              AM-PAC PT "6 Clicks" Mobility   Outcome Measure  Help needed turning from your back to your side while in a flat bed without using bedrails?: A Little Help needed moving from lying on your back to sitting on the side of a flat bed without using bedrails?: A Little Help needed moving to and from a bed to a chair (including a wheelchair)?: A Lot Help needed standing up from a chair using your arms (e.g., wheelchair or bedside chair)?: A Lot Help needed to walk in hospital room?: A Lot Help needed climbing 3-5 steps with a railing? : Total 6 Click Score: 13    End of Session Equipment Utilized During Treatment: Gait belt Activity Tolerance: Patient tolerated treatment well Patient left: in bed;with bed alarm set;with call bell/phone within reach;with family/visitor present Nurse Communication: Mobility status PT Visit Diagnosis: Unsteadiness on feet (R26.81);Other abnormalities of gait and mobility (R26.89);Muscle weakness (generalized) (M62.81)     Time: 3235-5732 PT Time Calculation (min) (ACUTE ONLY): 18 min  Charges:  $Therapeutic Exercise: 8-22 mins                     Edward Glass, PTA Acute Rehabilitation Services Pager [phone] Office [phone]     Edward Glass 09/04/2018, 2:50 PM

## 2018-09-04 NOTE — CV Procedure (Signed)
   Transesophageal Echocardiogram  Indications: Cryptogenic stroke  Time out performed  During this procedure the patient is administered Versed and Fentanyl to achieve and maintain moderate conscious sedation.  The patient's heart rate, blood pressure, and oxygen saturation are monitored continuously during the procedure. The period of conscious sedation is 25 minutes, of which I was present face-to-face 100% of this time.  Findings:  Left Ventricle: Normal left ventricular ejection fraction, 55% EF  Mitral Valve: Normal, no regurgitation  Aortic Valve: Normal  Tricuspid Valve: Trace tricuspid regurgitation  Left Atrium: Normal, no left atrial appendage thrombus  Bubble Contrast Study: Mildly positive bubble study with small bubble crossover with Valsalva/cough maneuver.  Interatrial septum: Redundant, hypermobile intra-atrial septum  Recent lower extremity Dopplers performed on 09/01/2018 were negative for DVT.  Impression: Small PFO/bubble crossover noted during Valsalva.  No embolic source identified.  Candee Furbish, MD

## 2018-09-04 NOTE — Progress Notes (Signed)
Occupational Therapy Treatment Patient Details Name: Edward Glass MRN: [id number] DOB: 10-29-1955 Today's Date: 09/04/2018    History of present illness Patient is a 62 y/o male presenting to the ED on 08/30/2018 with primary complaints of aphasia, R facila droop and R sided weakness. CT revealing Age indeterminate small vessel ischemia in the left basal ganglia. 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 PMH significant for hypogonadism, possible BPH. Of note, pt with worsening symptoms on the morning of 2/7. CT scan of the head was obtained which shows slight extension of left MCA infarct but no hemorrhage or new unexpected finding.   OT comments  Pt still with significant right UE and LE hemiparesis.  Needs overall max assist for bed mobility with max assist for UB and LB selfcare as well as stand pivot transfers.  Trace shoulder movement noted in the right but no active movement in the elbow or hand.  Currently he is tolerating a wrist cock-up splint on the right hand secondary to pain with flexion.  He does exhibit some receptive difficulties as well noted when given commands to pick up objects he reached the other direction.  He needed initial mod hand over hand to understand functional command.  Feel he is an excellent candidate for CIR level therapies and can easily tolerate 3 hours of therapy with anticipation of reaching at least min assist or better.  Pt's spouse present for session and she, along with other family members will be providing 24 hour supervision.   Follow Up Recommendations  CIR    Equipment Recommendations  Other (comment)(TBD next venue of care)    Recommendations for Other Services      Precautions / Restrictions Precautions Precautions: Fall Precaution Comments: R  hemiplegia Restrictions Weight Bearing Restrictions: No       Mobility Bed Mobility Overal bed mobility: Needs Assistance Bed Mobility: Supine to Sit;Rolling;Sit to Supine Rolling: Max assist   Supine to sit: Max assist Sit to supine: Mod assist   General bed mobility comments: Max demonstrational cueing with max assist for rolling to the left with min assist to the right.  Max assist to bring trunk up to sitting from the right as well  Transfers Overall transfer level: Needs assistance Equipment used: 2 person hand held assist Transfers: Sit to/from [facility] Sit to Stand: Mod assist Stand pivot transfers: Max assist            Balance Overall balance assessment: Needs assistance Sitting-balance support: Feet supported Sitting balance-Leahy Scale: Fair Sitting balance - Comments: Pt able to maintain upright midline sitting at EOB with supervision, min assist when completing reaching tasks     Standing balance-Leahy Scale: Poor Standing balance comment: Mod assist for static standing balance with max assist for stepping.                           ADL either performed or assessed with clinical judgement   ADL Overall ADL's : Needs assistance/impaired                         Toilet Transfer: Maximal assistance;Stand-pivot   Toileting- Clothing Manipulation and Hygiene: Maximal assistance         General ADL Comments: Pt still with significant right UE and LE hemiparesis.  He also demonstrates right inattention and neglect.  Mod assist for rolling to the right side with max assist for sidelying to sit EOB.  He was able to maintain static sitting balance with min assist for dynamic sitting balance while performing weightbearing tasks through the RUE and reaching to the right across his body with the LUE.  Max assist for sit to stand with max assist for taking 4-5 steps up toward the tops of the bed.  Pt with increased right wrist  pain with flexion so wrist cock-up splint donned for support.  Educated wife on positioning of the splint and donning/doffing.  Also educated her on PROM exercises for the RUE shoulder, elbow, forearm, and fingers.  Pt returned to bed with mod assist in preparation for transfer for TEE.                 Cognition Arousal/Alertness: Awake/alert Behavior During Therapy: Flat affect Overall Cognitive Status: Impaired/Different from baseline Area of Impairment: Following commands;Problem solving                       Following Commands: Follows one step commands inconsistently(receptive difficulties vs apraxia)                          Pertinent Vitals/ Pain       Pain Assessment: Faces Faces Pain Scale: Hurts a little bit Pain Location: right wrist  Pain Descriptors / Indicators: Discomfort Pain Intervention(s): Repositioned;Other (comment)(wrist cock-up splint)         Frequency  Min 3X/week        Progress Toward Goals  OT Goals(current goals can now be found in the care plan section)  Progress towards OT goals: Progressing toward goals     Plan Discharge plan remains appropriate       AM-PAC OT "6 Clicks" Daily Activity     Outcome Measure   Help from another person eating meals?: A Little Help from another person taking care of personal grooming?: A Little Help from another person toileting, which includes using toliet, bedpan, or urinal?: A Lot Help from another person bathing (including washing, rinsing, drying)?: A Lot Help from another person to put on and taking off regular upper body clothing?: A Lot Help from another person to put on and taking off regular lower body clothing?: A Lot 6 Click Score: 14    End of Session    OT Visit Diagnosis: Unsteadiness on feet (R26.81);Muscle weakness (generalized) (M62.81);Apraxia (R48.2);Hemiplegia and hemiparesis Hemiplegia - Right/Left: Right Hemiplegia - dominant/non-dominant:  Dominant Hemiplegia - caused by: Cerebral infarction   Activity Tolerance Patient tolerated treatment well   Patient Left with call bell/phone within reach;with family/visitor present;in bed;with bed alarm set   Nurse Communication Mobility status        Time: 3016-0109 OT Time Calculation (min): 58 min  Charges: OT General Charges $OT Visit: 1 Visit OT Treatments $Neuromuscular Re-education: 53-67 mins     Cantrell Martus OTR/L 09/04/2018, 2:18 PM

## 2018-09-05 ENCOUNTER — Inpatient Hospital Stay (HOSPITAL_COMMUNITY): Payer: BLUE CROSS/BLUE SHIELD | Admitting: Occupational Therapy

## 2018-09-05 ENCOUNTER — Inpatient Hospital Stay (HOSPITAL_COMMUNITY): Payer: BLUE CROSS/BLUE SHIELD | Admitting: Physical Therapy

## 2018-09-05 ENCOUNTER — Other Ambulatory Visit: Payer: Self-pay

## 2018-09-05 ENCOUNTER — Encounter (HOSPITAL_COMMUNITY): Payer: Self-pay | Admitting: Internal Medicine

## 2018-09-05 ENCOUNTER — Inpatient Hospital Stay (HOSPITAL_COMMUNITY): Payer: BLUE CROSS/BLUE SHIELD | Admitting: Speech Pathology

## 2018-09-05 ENCOUNTER — Inpatient Hospital Stay (HOSPITAL_COMMUNITY)
Admission: RE | Admit: 2018-09-05 | Discharge: 2018-09-29 | DRG: 057 | Disposition: A | Discharge status: Home Health | Payer: BLUE CROSS/BLUE SHIELD | Source: Intra-hospital | Attending: Physical Medicine & Rehabilitation | Admitting: Physical Medicine & Rehabilitation

## 2018-09-05 DIAGNOSIS — S62101S Fracture of unspecified carpal bone, right wrist, sequela: Secondary | ICD-10-CM

## 2018-09-05 DIAGNOSIS — Z7982 Long term (current) use of aspirin: Secondary | ICD-10-CM

## 2018-09-05 DIAGNOSIS — I10 Essential (primary) hypertension: Secondary | ICD-10-CM | POA: Diagnosis present

## 2018-09-05 DIAGNOSIS — E291 Testicular hypofunction: Secondary | ICD-10-CM | POA: Diagnosis present

## 2018-09-05 DIAGNOSIS — M25511 Pain in right shoulder: Secondary | ICD-10-CM | POA: Diagnosis not present

## 2018-09-05 DIAGNOSIS — Z841 Family history of disorders of kidney and ureter: Secondary | ICD-10-CM | POA: Diagnosis not present

## 2018-09-05 DIAGNOSIS — K5901 Slow transit constipation: Secondary | ICD-10-CM | POA: Diagnosis not present

## 2018-09-05 DIAGNOSIS — E785 Hyperlipidemia, unspecified: Secondary | ICD-10-CM | POA: Diagnosis present

## 2018-09-05 DIAGNOSIS — Z8042 Family history of malignant neoplasm of prostate: Secondary | ICD-10-CM

## 2018-09-05 DIAGNOSIS — M19011 Primary osteoarthritis, right shoulder: Secondary | ICD-10-CM | POA: Diagnosis present

## 2018-09-05 DIAGNOSIS — I69320 Aphasia following cerebral infarction: Secondary | ICD-10-CM

## 2018-09-05 DIAGNOSIS — I69351 Hemiplegia and hemiparesis following cerebral infarction affecting right dominant side: Secondary | ICD-10-CM | POA: Diagnosis present

## 2018-09-05 DIAGNOSIS — M25531 Pain in right wrist: Secondary | ICD-10-CM

## 2018-09-05 DIAGNOSIS — N401 Enlarged prostate with lower urinary tract symptoms: Secondary | ICD-10-CM | POA: Diagnosis present

## 2018-09-05 DIAGNOSIS — I69322 Dysarthria following cerebral infarction: Secondary | ICD-10-CM

## 2018-09-05 DIAGNOSIS — R338 Other retention of urine: Secondary | ICD-10-CM | POA: Diagnosis present

## 2018-09-05 DIAGNOSIS — G8929 Other chronic pain: Secondary | ICD-10-CM | POA: Diagnosis not present

## 2018-09-05 DIAGNOSIS — Z833 Family history of diabetes mellitus: Secondary | ICD-10-CM

## 2018-09-05 DIAGNOSIS — G8191 Hemiplegia, unspecified affecting right dominant side: Secondary | ICD-10-CM | POA: Diagnosis not present

## 2018-09-05 DIAGNOSIS — I639 Cerebral infarction, unspecified: Secondary | ICD-10-CM | POA: Diagnosis present

## 2018-09-05 DIAGNOSIS — G8111 Spastic hemiplegia affecting right dominant side: Secondary | ICD-10-CM

## 2018-09-05 DIAGNOSIS — I69391 Dysphagia following cerebral infarction: Secondary | ICD-10-CM

## 2018-09-05 MED ORDER — SORBITOL 70 % SOLN
30.0000 mL | Freq: Every day | Status: DC | PRN
  Administered 2018-09-05: 30 mL via ORAL
  Filled 2018-09-05: qty 30

## 2018-09-05 MED ORDER — SENNOSIDES-DOCUSATE SODIUM 8.6-50 MG PO TABS: 1.0000 | ORAL_TABLET | Freq: Every evening | ORAL | Status: DC | PRN

## 2018-09-05 MED ORDER — ACETAMINOPHEN 325 MG PO TABS
650.0000 mg | ORAL_TABLET | ORAL | Status: DC | PRN
  Administered 2018-09-06 – 2018-09-29 (×9): 650 mg via ORAL
  Filled 2018-09-05 (×9): qty 2

## 2018-09-05 MED ORDER — ACETAMINOPHEN 160 MG/5ML PO SOLN: 650.0000 mg | ORAL | Status: DC | PRN

## 2018-09-05 MED ORDER — ATORVASTATIN CALCIUM 40 MG PO TABS
40.0000 mg | ORAL_TABLET | Freq: Every day | ORAL | Status: DC
  Administered 2018-09-05 – 2018-09-28 (×24): 40 mg via ORAL
  Filled 2018-09-05 (×24): qty 1

## 2018-09-05 MED ORDER — ASPIRIN EC 325 MG PO TBEC
325.0000 mg | DELAYED_RELEASE_TABLET | Freq: Every day | ORAL | Status: DC
  Administered 2018-09-07 – 2018-09-08 (×2): 325 mg via ORAL
  Filled 2018-09-05 (×3): qty 1

## 2018-09-05 MED ORDER — ADULT MULTIVITAMIN W/MINERALS CH
1.0000 | ORAL_TABLET | Freq: Every day | ORAL | Status: DC
  Administered 2018-09-06 – 2018-09-29 (×24): 1 via ORAL
  Filled 2018-09-05 (×24): qty 1

## 2018-09-05 MED ORDER — ACETAMINOPHEN 650 MG RE SUPP: 650.0000 mg | RECTAL | Status: DC | PRN

## 2018-09-05 MED ORDER — STARCH (THICKENING) PO POWD: ORAL | Status: DC | PRN

## 2018-09-05 MED ORDER — ENOXAPARIN SODIUM 40 MG/0.4ML ~~LOC~~ SOLN
40.0000 mg | SUBCUTANEOUS | Status: DC
  Administered 2018-09-05 – 2018-09-28 (×24): 40 mg via SUBCUTANEOUS
  Filled 2018-09-05 (×24): qty 0.4

## 2018-09-05 MED ORDER — ASPIRIN 325 MG PO TBEC: 325.0000 mg | DELAYED_RELEASE_TABLET | Freq: Every day | ORAL | 0 refills | Status: DC

## 2018-09-05 MED ORDER — ENOXAPARIN SODIUM 40 MG/0.4ML ~~LOC~~ SOLN: 40.0000 mg | SUBCUTANEOUS | Status: DC

## 2018-09-05 MED ORDER — CLOPIDOGREL BISULFATE 75 MG PO TABS
75.0000 mg | ORAL_TABLET | Freq: Every day | ORAL | Status: DC
  Administered 2018-09-06 – 2018-09-29 (×24): 75 mg via ORAL
  Filled 2018-09-05 (×24): qty 1

## 2018-09-05 MED ORDER — RESOURCE THICKENUP CLEAR PO POWD
ORAL | Status: DC | PRN
  Filled 2018-09-05: qty 125

## 2018-09-05 MED ORDER — ALFUZOSIN HCL ER 10 MG PO TB24
10.0000 mg | ORAL_TABLET | ORAL | Status: DC
  Administered 2018-09-06 – 2018-09-27 (×10): 10 mg via ORAL
  Filled 2018-09-05 (×11): qty 1

## 2018-09-05 NOTE — Progress Notes (Signed)
Patient arrived on unit at 1pm accompanied by family. Denies any pain at time. Family and patient oriented to room and unit.

## 2018-09-05 NOTE — Progress Notes (Signed)
Inpatient Rehabilitation  Patient information reviewed and entered into eRehab system by Icess Bertoni M. Benyamin Jeff, M.A., CCC/SLP, PPS Coordinator.  Information including medical coding, functional ability and quality indicators will be reviewed and updated through discharge.    

## 2018-09-05 NOTE — Progress Notes (Signed)
Inpatient Rehabilitation-Admissions Coordinator   [facility] has received medical approval and insurance approval for admit to CIR today. Pt and his family have been updated on plan. RN, SW/CM aware.   Please call if questions.   Jhonnie Garner, OTR/L  Rehab Admissions Coordinator  [phone] 09/05/2018 7:35 AM

## 2018-09-05 NOTE — Progress Notes (Addendum)
The patient is stable for d/c to CIR today. Please see d/c summary from 09/04/18 which I have updated today.

## 2018-09-05 NOTE — Care Management Note (Signed)
Case Management Note  Patient Details  Name: Edward Glass MRN: [id number] Date of Birth: 1956-02-25  Subjective/Objective:                    Action/Plan: Pt discharging to CIR today. CM signing off.  Expected Discharge Date:  09/04/18               Expected Discharge Plan:  [place]  In-House Referral:     Discharge planning Services  CM Consult  Post Acute Care Choice:    Choice offered to:     DME Arranged:    DME Agency:     HH Arranged:    HH Agency:     Status of Service:  Completed, signed off  If discussed at [employer], dates discussed:    Additional Comments:  Pollie Friar, RN 09/05/2018, 10:53 AM

## 2018-09-05 NOTE — Progress Notes (Signed)
PMR Admission Coordinator Pre-Admission Assessment  Patient: Edward Glass is an 63 y.o., male MRN: [id number] DOB: Nov 04, 1955 Height: 5' 5"  (165.1 cm) Weight: 79.3 kg                                                                                                                                                  Insurance Information HMO:     PPO: Yes     PCP:      IPA:      80/20:      OTHER:  PRIMARY: BCBS      Policy#: [id number]      Subscriber: Patient CM Name: Edward Glass      Phone#: [phone]     Fax#: [phone] Pre-Cert#: [id number]      Employer: Patient Auth provided by Edward Glass on 09/04/18 for admit to CIR. Effective 2/11-2/18; Clinical updates due to [place] on 09/11/18.  Benefits:  Phone #: NA    Name: Edward Glass Eff. Date: 07/26/18     Deduct: $7,500 (met $0)      Out of Pocket Max: $8,150 (Met $0)      Life Max: NA CIR: 60%/40%      SNF: 60%/40%, 60 day limit Outpatient: 30/habil PT/OT, 30/Rehab PT/OT, 30/Rehab ST, 30/Habil ST     Co-Pay: $115/visit Home Health: 60%, per necessity ("cont. Improvement w/ pre cert necessary)     Co-Pay: 40% DME: 60%     Co-Pay: 40% Providers:  SECONDARY:       Policy#:       Subscriber:  CM Name:       Phone#:      Fax#:  Pre-Cert#:       Employer:  Benefits:  Phone #:      Name:  Eff. 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 Pt was admitted on 08/30/2018 with right-sided weakness/falland slurred speech. An MRI showed multi small foci of acute  early subacute infarction present in the left MCA distribution concentrated in the left basal ganglia. Pt had a change in functional status on 09/01/2018 with increase weakness of the right side and dysarthria; a follow up CT scan showed more confluent acute infarct in the left basal ganglia and corona radiata when compared to diffusion imaging 2 days prior. No new distribution infarction or hemorrhagic conversion. Modified barium swallow completed presently on a dysphagia #2 nectar thick liquid diet.TEE and loop recorder completed 09/04/2018.Patient with x-rays of right hand after recent fall related to CVA showed possible fracture of the triquetrum with conservative care per orthopedic services Dr. Stann Mainland and weightbearing as tolerated and placed in a splint.Therapies have recommended CIR. Patient was admitted for a comprehensive rehabilitation program on 09/05/18.   Complete NIHSS TOTAL: 12  Past Medical History      Past Medical History:  Diagnosis Date  . Atypical chest pain 08/10/2013  . Elevated aspartate aminotransferase level 06/16/2015  . Excessive salivation 06/16/2015  . H/O nutritional disorder 06/16/2015  . 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 Screening for prostate cancer 08/21/2014  . 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  Prior Rehab/Hospitalizations:  Has the patient had major surgery during 100 days prior to admission? No  Current Medications   Current Facility-Administered Medications:  .  acetaminophen (TYLENOL) tablet 650 mg, 650 mg, Oral, Q4H PRN **OR** acetaminophen (TYLENOL) solution 650 mg,  650 mg, Per Tube, Q4H PRN **OR** acetaminophen (TYLENOL) suppository 650 mg, 650 mg, Rectal, Q4H PRN, Jerline Pain, MD .  alfuzosin (UROXATRAL) 24 hr tablet 10 mg, 10 mg, Oral, Once per day on Mon Wed Fri, Skains, Mark C, MD .  aspirin EC tablet 325 mg, 325 mg, Oral, Daily, Evans Lance, MD .  atorvastatin (LIPITOR) tablet 40 mg, 40 mg, Oral, q1800, Jerline Pain, MD, 40 mg at 09/03/18 1848 .  clopidogrel (PLAVIX) tablet 75 mg, 75 mg, Oral, Daily, Jerline Pain, MD, 75 mg at 09/03/18 0905 .  enoxaparin (LOVENOX) injection 40 mg, 40 mg, Subcutaneous, Q24H, Jerline Pain, MD, 40 mg at 09/03/18 2047 .  food thickener (THICK IT) powder, , Oral, PRN, Jerline Pain, MD .  multivitamin with minerals tablet 1 tablet, 1 tablet, Oral, Daily, Jerline Pain, MD, 1 tablet at 09/03/18 0905 .  ondansetron (ZOFRAN) injection 4 mg, 4 mg, Intravenous, Q6H PRN, Evans Lance, MD .  senna-docusate (Senokot-S) tablet 1 tablet, 1 tablet, Oral, QHS PRN, Jerline Pain, MD, 1 tablet at 09/03/18 2047 .  sodium phosphate (FLEET) 7-19 GM/118ML enema 1 enema, 1 enema, Rectal, Daily PRN, Jerline Pain, MD  Patients Current Diet:     Diet Order                  DIET DYS 2 Room service appropriate? 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 None of the above  Prior Functional Level Prior Function Level of  Independence: Independent Comments: works as a Comptroller Care: Did the patient need help bathing, dressing, using the toilet or eating?  Independent  Indoor Mobility: Did the patient need assistance with walking from room to room (with or without device)? Independent  Stairs: Did the patient need assistance with internal or external stairs (with or without device)? Independent  Functional Cognition: Did the patient need help planning regular tasks such as shopping or remembering to take medications? Independent  Current Functional Level Cognition  Arousal/Alertness: Awake/alert Overall Cognitive Status: Impaired/Different from baseline Difficult to assess due to: Impaired communication Orientation Level: Oriented X4 Following Commands: Follows one step commands inconsistently(receptive difficulties vs. apraxia) General Comments: difficult to assess due to aphasia; pt able to follow simple one-step commands but required frequent verbal and tactile cueing    Extremity Assessment (includes Sensation/Coordination)  Upper Extremity Assessment: RUE deficits/detail RUE Deficits / Details: 3+/5 grossly, some possible R side neglect. Family reports h/o issues with R shoulder. difficulty maintaining full grasp on rw. RUE Sensation: decreased proprioception RUE Coordination: decreased fine motor, decreased gross motor  Lower Extremity Assessment: Defer to PT evaluation [name] Deficits / Details: grossly 3+/5 [name] Sensation: decreased proprioception LLE Deficits / Details: grossly 5/5 LLE Sensation: WNL    ADLs  Overall ADL's : Needs assistance/impaired Eating/Feeding: Minimal assistance, Modified independent, Sitting Eating/Feeding Details (indicate cue type and reason): provided built-up foam for use with feeding utensils Grooming: Sitting, Moderate assistance Upper Body Bathing: Moderate assistance, Sitting Lower Body Bathing: Maximal assistance, Sit to/from  stand Upper Body Dressing : Maximal assistance, Sitting Lower Body Dressing: Maximal assistance Toilet Transfer: Maximal assistance, Stand-pivot Toilet Transfer Details (indicate cue type and reason): walked halfway to bathroom this session Toileting- Clothing Manipulation and Hygiene: Maximal assistance Tub/ Shower Transfer: Moderate assistance, +2 for physical assistance Functional mobility during ADLs: Moderate assistance, +2 for physical assistance, +2 for safety/equipment General ADL Comments: Pt still with significant right UE and LE hemiparesis.  He also demonstrates right inattention and neglect.  Mod assist for rolling to the right side with max assist for sidelying to sit EOB.  He was able to maintain static sitting balance with min assist for dynamic sitting balance while performing weightbearing tasks through the RUE and reaching to the right across his body with the LUE.  Max assist for sit to stand with max assist for taking 4-5 steps up toward the tops of the bed.  Pt with increased right wrist pain with flexion so wrist cock-up splint donned for support.  Educated wife on positioning of the splint and donning/doffing.  Also educated her on PROM exercises for the RUE shoulder, elbow, forearm, and fingers.  Pt returned to bed with mod assist in preparation for transfer for TEE.      Mobility  Overal bed mobility: Needs Assistance Bed Mobility: Rolling Rolling: Max assist, Mod assist Supine to sit: Max assist Sit to supine: Mod assist General bed mobility comments: Performed x 5 reps of rolling to L side.  Pt required Hand over hand placement to assist R UE.  Pt required cues to look R and PTA assisted [name] into hooklying to push through R LE to roll to the Left side.  mod-max assistance.  Transfers  Overall transfer level: Needs assistance Equipment used: 2 person hand held assist Transfers: Sit to/from Stand, Stand Pivot Transfers Sit to Stand: Mod assist Stand pivot  transfers: Max assist General transfer comment: pt progressing from mod A x2 to min A with sit<>stands; therapist blocking R knee throughout; pt with very poor eccentric control to return to sitting    Ambulation / Gait / Stairs / Wheelchair Mobility  Ambulation/Gait Ambulation/Gait assistance: Mod assist, +2 physical assistance, +2 safety/equipment Gait Distance (Feet): 20 Feet Assistive device: 1 person hand held assist Gait Pattern/deviations: Step-to pattern, Decreased step length - right, Decreased step length - left, Decreased stance time - right, Decreased weight shift to right, Narrow base of support General Gait Details: pt only able to take a few pivotal steps towards his L side with mod-max A x2 Gait velocity: decreased    Posture / Balance Dynamic Sitting Balance Sitting balance - Comments: Pt able to maintain upright midline sitting at EOB with supervision, min assist when completing reaching tasks Balance Overall balance assessment: Needs assistance Sitting-balance support: Feet supported Sitting balance-Leahy Scale: Fair Sitting balance - Comments: Pt able to maintain upright midline sitting at EOB with supervision, min assist when completing reaching tasks Standing balance support: Single extremity supported, During functional activity Standing balance-Leahy Scale: Poor Standing balance comment: Mod assist for static standing balance with max assist for stepping.    Special needs/care consideration BiPAP/CPAP: no CPM: no Continuous Drip IV: no Dialysis: no        Days: no Life Vest: no Oxygen: no Special Bed: no Trach Size :no Wound Vac (area): no      Location:NA Skin: no areas of concern                  Bowel mgmt: continent, last BM 08/29/18 Bladder mgmt: continent Diabetic mgmt: No     Previous Home Environment Living Arrangements: Alone Available Help at Discharge: Family Type of Home: House Home Layout: Two level Alternate Level Stairs-Rails:  Left Alternate Level Stairs-Number of Steps: 10 Home Access: Stairs to enter Entrance Stairs-Rails: None Entrance Stairs-Number of Steps: 2 Bathroom Shower/Tub: Multimedia programmer: Standard  Discharge Living Setting Plans for Discharge Living Setting: Patient's home  Type of Home at Discharge: House Discharge Home Layout: Two level Alternate Level Stairs-Rails: None(NA) Alternate Level Stairs-Number of Steps: NA Discharge Home Access: Stairs to enter Entrance Stairs-Rails: None Entrance Stairs-Number of Steps: 1 Discharge Bathroom Shower/Tub: Horticulturist, commercial: Standard Discharge Bathroom Accessibility: Yes How Accessible: Accessible via walker Does the patient have any problems obtaining your medications?: No  *Another option if needed is for pt to return home to daughter's [name]) house. She has a one story home with 5 steps to enter and right and left hand rail, tub shower, standard height toilet, and a RW accessible bathroom. Pt's daughter is open to having ramp built.   Social/Family/Support Systems Patient Roles: Other (Comment)(personal trainor; has significant other) Contact Information: Jeneen Rinks (brother): [phone]; Lennie Odor (significant other): [phone]; Santiago Glad (Sister in law): [phone]; [name] (Daughter): [phone]; Jonelle Sidle (daughter): [phone] Anticipated Caregiver: family ([name] to work from home per pt's brother) all family to pitch in for 24/7 A Anticipated Caregiver's Contact Information: see above Ability/Limitations of Caregiver: Supervision/Min A Caregiver Availability: 24/7 Discharge Plan Discussed with Primary Caregiver: Yes Is Caregiver In Agreement with Plan?: Yes Does Caregiver/Family have Issues with Lodging/Transportation while Pt is in Rehab?: No   Goals/Additional Needs Patient/Family Goal for Rehab: PT: Min  A, OT: Min/Mod A SLP: Supervision/Min A Expected length of stay: 15-20  days Cultural Considerations: NA Dietary Needs: Heart Healthy, thin liquids Equipment Needs: TBD Pt/Family Agrees to Admission and willing to participate: Yes Program Orientation Provided & Reviewed with Pt/Caregiver Including Roles  & Responsibilities: Yes(pt and family)  Barriers to Discharge: Home environment access/layout  Barriers to Discharge Comments: stairs to enter home   Decrease burden of Care through IP rehab admission: NA   Possible need for SNF placement upon discharge: Not anticipated; pt has good family support and goals for Supervision. Pt has an excellent prognosis for further recovery through CIR.    Patient Condition: This patient's medical and functional status has changed since the consult dated: 08/31/18 in which the Rehabilitation Physician determined and documented that the patient's condition is appropriate for intensive rehabilitative care in an inpatient rehabilitation facility. See "History of Present Illness" (above) for medical update. Functional changes are: functional decline in bed mobility from Min A to Max A, transfers from Mod A of 1 person HH A to Mod A of 2 people HHA, and a decline in ability to ambulate form Mod A x2 for 20 feet to only pivotal steps and Mod/Max Ax2. Patient's medical and functional status update has been discussed with the Rehabilitation physician and patient remains appropriate for inpatient rehabilitation. Will admit to inpatient rehab today.  Preadmission Screen Completed By:  Jhonnie Garner, 09/05/2018 7:31 AM ______________________________________________________________________   Discussed status with Dr. Naaman Plummer on 09/05/18 at 7:31AM and received telephone approval for admission today.  Admission Coordinator:  Jhonnie Garner, time 7:31AM/Date 09/05/18       Cosigned by: Meredith Staggers, MD at 09/05/2018 9:11 AM  Revision History

## 2018-09-05 NOTE — Progress Notes (Signed)
Edward Blake, MD  Physician  Physical Medicine and Rehabilitation  Consult Note  Signed  Date of Service:  08/31/2018 11:54 AM       Related encounter: ED to Hosp-Admission (Discharged) from 08/30/2018 in [facility]      Signed      Expand All Collapse All         Physical Medicine and Rehabilitation Consult Reason for Consult: Right facial droop and right side weakness Referring Physician:  [facility]   HPI: Edward Glass is a 63 y.o.right handed male with history of hypogonadism, BPH. Per chart review patient lives alone. 2 level home to steps to entry. Independent prior to admission. Patient has worked as a Radio producer. He does have family in the area.Presented to 12/13/2018 right side weakness and slurred speech. Cranial CT scan showed age indeterminate small vessel ischemia in the left basal ganglia new since 2010. CT perfusion scan as well as CTA head and neck showed no large vessel occlusion. 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 Patient did not receive TPA. Echocardiogram pending. Presently on aspirin and Plavix for CVA prophylaxis. Subcutaneous Lovenox for DVT prophylaxis. AWAIT PLAN FOR tee AND LOOP RECORDER. Tolerating a regular diet. Therapy evaluation completed with recommendations of physical medicine rehabilitation consult.   Review of Systems  Constitutional: Negative for chills and fever.  HENT: Negative for hearing loss.   Eyes: Negative for blurred vision and double vision.  Respiratory: Negative for cough and shortness of breath.   Cardiovascular: Negative for chest pain, palpitations and leg swelling.  Gastrointestinal: Positive for constipation. Negative for nausea and vomiting.  Genitourinary: Positive for urgency. Negative for hematuria.  Skin: Negative for rash.  Neurological: Positive for speech change and  focal weakness.  All other systems reviewed and are negative.      Past Medical History:  Diagnosis Date  . Atypical chest pain 08/10/2013  . Elevated aspartate aminotransferase level 06/16/2015  . Excessive salivation 06/16/2015  . H/O nutritional disorder 06/16/2015  . 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 Screening for prostate cancer 08/21/2014  . 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 HERNIA REPAIR  2006  . left tricep surgery          Family History  Problem Relation Age of Onset  . Kidney failure Mother   . Cancer Mother   . Prostate cancer Father   . Cancer Father   . Diabetes Father   . Colon cancer Neg Hx   . Esophageal cancer Neg Hx   . Rectal cancer Neg Hx   . Stomach cancer Neg Hx    Social History:  reports that he has never smoked. He has never used smokeless tobacco. He reports that he does not drink alcohol or use drugs. Allergies: No Known Allergies       Medications Prior to Admission  Medication Sig Dispense Refill  . alfuzosin (UROXATRAL) 10 MG 24 hr tablet Take 10 mg by mouth 3 (three) times a week.    Marland Kitchen aspirin 81 MG tablet Take 81 mg by mouth daily.    Marland Kitchen MILK THISTLE PO Take 2 tablets by mouth daily.    . Misc Natural Products (SAW PALMETTO) CAPS Take 2-4 capsules by mouth daily.    . multivitamin (ONE-A-DAY MEN'S) TABS tablet Take  1 tablet by mouth daily.    . sildenafil (REVATIO) 20 MG tablet 2-5 pills as needed for ED symptoms (Patient not taking: Reported on 08/30/2018) 50 tablet 5    Home: Home Living Family/patient expects to be discharged to:: Private residence Living Arrangements: Alone Available Help at Discharge: Family Type of Home: House Home Access: Stairs to enter Technical brewer of Steps: 2 Entrance Stairs-Rails: None Home Layout: Two level Alternate Level Stairs-Number of Steps: 10 Alternate Level Stairs-Rails:  Left Bathroom Shower/Tub: Multimedia programmer: Standard Home Equipment: Careers adviser History: Prior Function Level of Independence: Independent Comments: works as a Psychologist, occupational Status:  Mobility: [place] bed mobility: Needs Assistance Bed Mobility: Supine to Sit, Sit to Supine Supine to sit: Min assist Sit to supine: Min assist General bed mobility comments: increased time and effort by patient; disuse of R UE to assist Transfers Overall transfer level: Needs assistance Equipment used: 1 person hand held assist Transfers: Sit to/from Stand, Stand Pivot Transfers Sit to Stand: Mod assist Stand pivot transfers: Mod assist General transfer comment: heavy Mod A to rise from bed and for stand pivot transfer; patient with heavy use of  L LE to power up; cueing for weight shifting; initially with posterior LOB with use of LE on bed frame - Mod A to maintain upright standing balance Ambulation/Gait Ambulation/Gait assistance: Mod assist, +2 physical assistance, +2 safety/equipment Gait Distance (Feet): 20 Feet Assistive device: 1 person hand held assist Gait Pattern/deviations: Step-to pattern, Decreased step length - right, Decreased step length - left, Decreased stance time - right, Decreased weight shift to right, Narrow base of support General Gait Details: Heavy Mod A for gait with 1 HHA at LUE; poor balance throughout; required chair follow; noted poor weight shift to R LE as well as limited hip flexion needed for swing phase of gait Gait velocity: decreased  ADL:  Cognition: Cognition Overall Cognitive Status: Difficult to assess Orientation Level: Oriented X4 Cognition Arousal/Alertness: Awake/alert Behavior During Therapy: Flat affect Overall Cognitive Status: Difficult to assess Difficult to assess due to: Impaired communication  Blood pressure 111/65, pulse 61, temperature 98 F (36.7 C), temperature source Oral,  resp. rate 16, height 5\' 5"  (1.651 m), weight 79.3 kg, SpO2 96 %. Physical Exam  Vitals reviewed. HENT:  Mild facial weakness  Eyes: EOM are normal.  Neck: Normal range of motion. Neck supple. No thyromegaly present.  Cardiovascular: Normal rate and regular rhythm.  Respiratory: Effort normal and breath sounds normal. No respiratory distress.  GI: Soft. Bowel sounds are normal. He exhibits no distension. There is no abdominal tenderness.  Neurological: He is alert.  Alert. Mood is flat but appropriate. He was able to state his name appears to have a component of aphasia. Patient able to state hospital, unable to explain reason for hospitalization. Naming objects is impaired he is able to name ring and stethoscope but not watch Sensation difficult to assess secondary to aphasia and dysarthria. Hypophonic dysarthria moderately severe Motor strength is 3- at the right deltoid bicep tricep finger flexors and extensors.  He is able to follows finger to thumb on the right hand Right lower extremity 3- at the hip flexors knee extensors and ankle dorsiflexors and plantar flexors 5/5 on the left side   Skin: Skin is warm and dry.  Psychiatric: He has a normal mood and affect.    LabResultsLast24Hours       Results for orders placed or performed during the hospital encounter  of 08/30/18 (from the past 24 hour(s))  Comprehensive metabolic panel     Status: Abnormal   Collection Time: 08/30/18  3:46 PM  Result Value Ref Range   Sodium 140 135 - 145 mmol/L   Potassium 3.7 3.5 - 5.1 mmol/L   Chloride 105 98 - 111 mmol/L   CO2 23 22 - 32 mmol/L   Glucose, Bld 100 (H) 70 - 99 mg/dL   BUN 17 8 - 23 mg/dL   Creatinine, Ser 0.91 0.61 - 1.24 mg/dL   Calcium 9.4 8.9 - 10.3 mg/dL   Total Protein 7.1 6.5 - 8.1 g/dL   Albumin 3.8 3.5 - 5.0 g/dL   AST 40 15 - 41 U/L   ALT 35 0 - 44 U/L   Alkaline Phosphatase 65 38 - 126 U/L   Total Bilirubin 0.4 0.3 - 1.2 mg/dL   GFR calc non  Af Amer >60 >60 mL/min   GFR calc Af Amer >60 >60 mL/min   Anion gap 12 5 - 15  CBC with Differential     Status: None   Collection Time: 08/30/18  3:46 PM  Result Value Ref Range   WBC 6.5 4.0 - 10.5 K/uL   RBC 5.28 4.22 - 5.81 MIL/uL   Hemoglobin 15.4 13.0 - 17.0 g/dL   HCT 48.5 39.0 - 52.0 %   MCV 91.9 80.0 - 100.0 fL   MCH 29.2 26.0 - 34.0 pg   MCHC 31.8 30.0 - 36.0 g/dL   RDW 12.7 11.5 - 15.5 %   Platelets 269 150 - 400 K/uL   nRBC 0.0 0.0 - 0.2 %   Neutrophils Relative % 59 %   Neutro Abs 3.8 1.7 - 7.7 K/uL   Lymphocytes Relative 33 %   Lymphs Abs 2.2 0.7 - 4.0 K/uL   Monocytes Relative 7 %   Monocytes Absolute 0.5 0.1 - 1.0 K/uL   Eosinophils Relative 1 %   Eosinophils Absolute 0.1 0.0 - 0.5 K/uL   Basophils Relative 0 %   Basophils Absolute 0.0 0.0 - 0.1 K/uL   Immature Granulocytes 0 %   Abs Immature Granulocytes 0.01 0.00 - 0.07 K/uL  Ethanol     Status: None   Collection Time: 08/30/18  4:57 PM  Result Value Ref Range   Alcohol, Ethyl (B) <10 <10 mg/dL  Urinalysis, Routine w reflex microscopic     Status: Abnormal   Collection Time: 08/30/18  6:01 PM  Result Value Ref Range   Color, Urine STRAW (A) YELLOW   APPearance CLEAR CLEAR   Specific Gravity, Urine 1.014 1.005 - 1.030   pH 5.0 5.0 - 8.0   Glucose, UA NEGATIVE NEGATIVE mg/dL   Hgb urine dipstick SMALL (A) NEGATIVE   Bilirubin Urine NEGATIVE NEGATIVE   Ketones, ur NEGATIVE NEGATIVE mg/dL   Protein, ur NEGATIVE NEGATIVE mg/dL   Nitrite NEGATIVE NEGATIVE   Leukocytes, UA NEGATIVE NEGATIVE   RBC / HPF 0-5 0 - 5 RBC/hpf   WBC, UA 0-5 0 - 5 WBC/hpf   Bacteria, UA NONE SEEN NONE SEEN   Mucus PRESENT   Urine rapid drug screen (hosp performed)     Status: None   Collection Time: 08/30/18  6:17 PM  Result Value Ref Range   Opiates NONE DETECTED NONE DETECTED   Cocaine NONE DETECTED NONE DETECTED   Benzodiazepines NONE DETECTED NONE DETECTED   Amphetamines  NONE DETECTED NONE DETECTED   Tetrahydrocannabinol NONE DETECTED NONE DETECTED   Barbiturates NONE DETECTED NONE DETECTED  Hemoglobin A1c     Status: Abnormal   Collection Time: 08/31/18  4:47 AM  Result Value Ref Range   Hgb A1c MFr Bld 6.2 (H) 4.8 - 5.6 %   Mean Plasma Glucose 131.24 mg/dL  Lipid panel     Status: Abnormal   Collection Time: 08/31/18  4:47 AM  Result Value Ref Range   Cholesterol 173 0 - 200 mg/dL   Triglycerides 86 <150 mg/dL   HDL 52 >40 mg/dL   Total CHOL/HDL Ratio 3.3 RATIO   VLDL 17 0 - 40 mg/dL   LDL Cholesterol 104 (H) 0 - 99 mg/dL      ImagingResults(Last48hours)  Ct Angio Head W Or Wo Contrast  Result Date: 08/30/2018 CLINICAL DATA:  Initial evaluation for acute speech difficulty, right facial weakness. EXAM: CT ANGIOGRAPHY HEAD AND NECK CT PERFUSION BRAIN TECHNIQUE: Multidetector CT imaging of the head and neck was performed using the standard protocol during bolus administration of intravenous contrast. Multiplanar CT image reconstructions and MIPs were obtained to evaluate the vascular anatomy. Carotid stenosis measurements (when applicable) are obtained utilizing NASCET criteria, using the distal internal carotid diameter as the denominator. Multiphase CT imaging of the brain was performed following IV bolus contrast injection. Subsequent parametric perfusion maps were calculated using RAPID software. CONTRAST:  128mL ISOVUE-370 IOPAMIDOL (ISOVUE-370) INJECTION 76% COMPARISON:  Prior noncontrast CT from earlier same day. FINDINGS: CTA NECK FINDINGS Aortic arch: Visualized aortic arch of normal caliber with normal branch pattern. No hemodynamically significant stenosis about the origin of the great vessels. Visualized subclavian arteries widely patent. Right carotid system: Right common carotid artery patent from its origin to the bifurcation without stenosis. Minimal mixed plaque about the right bifurcation without stenosis. Short-segment mild  stenosis of approximately 20% by NASCET criteria just distally within the proximal right ICA. Right ICA otherwise widely patent to the skull base without stenosis, dissection, or occlusion. Left carotid system: Left common carotid artery patent from its origin to the bifurcation without stenosis. No significant atheromatous narrowing about the left bifurcation. Left ICA widely patent from the bifurcation to the skull base without stenosis, dissection, or occlusion. Vertebral arteries: Both of the vertebral arteries arise from the subclavian arteries. Vertebral arteries widely patent within the neck without stenosis, dissection, or occlusion. Skeleton: No acute osseous finding. No discrete lytic or blastic osseous lesions. Mild to moderate cervical spondylolysis at C4-5 through C6-7. Prominent right-sided facet arthrosis noted at C3-4. 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 Soft tissues of the neck demonstrate no other acute finding. Upper chest: Layering fluid noted within the upper esophageal lumen. Visualized upper chest demonstrates no other acute finding. Partially visualized lungs are clear. Review of the MIP images confirms the above findings CTA HEAD FINDINGS Anterior circulation: Petrous ICAs widely patent bilaterally. Cavernous and supraclinoid segments widely patent without stenosis. 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 ICA termini well perfused distally. A1 segments patent bilaterally. Right A1 hypoplastic, likely accounting for the dominant left ICA as compared to the right. Normal anterior communicating artery. Anterior cerebral arteries widely patent to their distal aspects without stenosis. Right M1 widely patent without stenosis. Normal right MCA bifurcation. Distal right MCA branches well perfused. Left M1 patent proximally. Focal loss of contrast opacification involving the mid left M1 segment, most consistent with a severe near occlusive stenosis  (series 7, image 111). Upon review of prior MRI from 2010, this is likely chronic in nature. Stenosis measures approximately 3 mm in length. Left  M1 patent distally. Normal left MCA bifurcation. Left MCA branches are opacified distally, although overall attenuated as compared to the right due to the severe M1 stenosis. Posterior circulation: Vertebral arteries patent to the vertebrobasilar junction without stenosis. Posterior inferior cerebral arteries patent bilaterally. Proximal and mid basilar artery diminutive but widely patent without stenosis. Persistent left-sided trigeminal artery supplies the distal basilar artery which is more robust in caliber. Superior cerebral arteries patent bilaterally. Both of the posterior cerebral arteries primarily supplied via the basilar and are well perfused to their distal aspects. Venous sinuses: Grossly patent, although limited in assessment due to arterial timing the contrast bolus. Anatomic variants: Persistent left trigeminal artery. No intracranial aneurysm. Delayed phase: Not performed. Review of the MIP images confirms the above findings CT Brain Perfusion Findings: CBF (<30%) Volume: 20mL Perfusion (Tmax>6.0s) volume: 148mL Mismatch Volume: 144mL Infarction Location:No core infarct identified by CT perfusion. Large perfusion mismatch throughout the left MCA territory due to the severe left M1 stenosis. IMPRESSION: 1. Negative CTA for large vessel occlusion. No core infarct evident by CT perfusion. 2. Short-segment severe mid left M1 stenosis, likely similar as compared to previous MRI from 11/19/2008. Associated delayed perfusion throughout the left MCA territory distally. 3. No other hemodynamically significant or correctable stenosis within the major arterial vasculature of the head and neck. 4. Persistent left trigeminal artery. Finding discussed with Dr. Leonel Ramsay by telephone at approximately 9:45 p.m. on 08/30/2018. Electronically Signed   By: Jeannine Boga M.D.   On: 08/30/2018 22:59   Dg Chest 2 View  Result Date: 08/30/2018 CLINICAL DATA:  63 year old male with slurred speech and cough today. EXAM: CHEST - 2 VIEW COMPARISON:  08/27/2018 chest radiographs and earlier. FINDINGS: Lower lung volumes on both views. No pneumothorax, pulmonary edema, pleural effusion or consolidation. Mild basilar crowding of markings. Stable cardiac size at the upper limits of normal. Other mediastinal contours are within normal limits. Visualized tracheal air column is within normal limits. No acute osseous abnormality identified. Negative visible bowel gas pattern. Previous ventral abdominal hernia repair with mesh. IMPRESSION: Lower lung volumes, otherwise no acute cardiopulmonary abnormality. Electronically Signed   By: Genevie Ann M.D.   On: 08/30/2018 17:15   Ct Head Wo Contrast  Result Date: 08/30/2018 CLINICAL DATA:  63 year old male with abnormal speech onset today. EXAM: CT HEAD WITHOUT CONTRAST TECHNIQUE: Contiguous axial images were obtained from the base of the skull through the vertex without intravenous contrast. COMPARISON:  Brain MRI 11/19/2008. FINDINGS: Brain: A small area of right parietal lobe cortical encephalomalacia is redemonstrated on series 3, image 22. Cerebral volume has not significantly changed. New abnormal hypodensity in the left posterior limb internal capsule and/or lentiform (series 3, image 14). New hypodensity also in the left caudate and anterior lentiform on image 17. No acute intracranial hemorrhage identified. No midline shift, mass effect, or evidence of intracranial mass lesion. No ventriculomegaly. No acute cortically based infarct identified. Incidental tentorial dural calcifications. Vascular: No suspicious intracranial vascular hyperdensity. Skull: Negative. Sinuses/Orbits: Right frontal ethmoid and maxillary sinus disease with fluid levels is new since 2010. The right sphenoid and left paranasal sinuses are spared. Tympanic  cavities and mastoids are clear. Other: Mildly Disconjugate gaze, otherwise negative orbits. Visualized scalp soft tissues are within normal limits. IMPRESSION: 1. 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 2. Right O[facility] pattern paranasal sinusitis. Electronically Signed   By: Genevie Ann M.D.   On: 08/30/2018 17:39   Ct Angio Neck  W Or Wo Contrast  Result Date: 08/30/2018 CLINICAL DATA:  Initial evaluation for acute speech difficulty, right facial weakness. EXAM: CT ANGIOGRAPHY HEAD AND NECK CT PERFUSION BRAIN TECHNIQUE: Multidetector CT imaging of the head and neck was performed using the standard protocol during bolus administration of intravenous contrast. Multiplanar CT image reconstructions and MIPs were obtained to evaluate the vascular anatomy. Carotid stenosis measurements (when applicable) are obtained utilizing NASCET criteria, using the distal internal carotid diameter as the denominator. Multiphase CT imaging of the brain was performed following IV bolus contrast injection. Subsequent parametric perfusion maps were calculated using RAPID software. CONTRAST:  180mL ISOVUE-370 IOPAMIDOL (ISOVUE-370) INJECTION 76% COMPARISON:  Prior noncontrast CT from earlier same day. FINDINGS: CTA NECK FINDINGS Aortic arch: Visualized aortic arch of normal caliber with normal branch pattern. No hemodynamically significant stenosis about the origin of the great vessels. Visualized subclavian arteries widely patent. Right carotid system: Right common carotid artery patent from its origin to the bifurcation without stenosis. Minimal mixed plaque about the right bifurcation without stenosis. Short-segment mild stenosis of approximately 20% by NASCET criteria just distally within the proximal right ICA. Right ICA otherwise widely patent to the skull base without stenosis, dissection, or occlusion. Left carotid system: Left common carotid artery patent from its origin to the bifurcation  without stenosis. No significant atheromatous narrowing about the left bifurcation. Left ICA widely patent from the bifurcation to the skull base without stenosis, dissection, or occlusion. Vertebral arteries: Both of the vertebral arteries arise from the subclavian arteries. Vertebral arteries widely patent within the neck without stenosis, dissection, or occlusion. Skeleton: No acute osseous finding. No discrete lytic or blastic osseous lesions. Mild to moderate cervical spondylolysis at C4-5 through C6-7. Prominent right-sided facet arthrosis noted at C3-4. 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 Soft tissues of the neck demonstrate no other acute finding. Upper chest: Layering fluid noted within the upper esophageal lumen. Visualized upper chest demonstrates no other acute finding. Partially visualized lungs are clear. Review of the MIP images confirms the above findings CTA HEAD FINDINGS Anterior circulation: Petrous ICAs widely patent bilaterally. Cavernous and supraclinoid segments widely patent without stenosis. 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 ICA termini well perfused distally. A1 segments patent bilaterally. Right A1 hypoplastic, likely accounting for the dominant left ICA as compared to the right. Normal anterior communicating artery. Anterior cerebral arteries widely patent to their distal aspects without stenosis. Right M1 widely patent without stenosis. Normal right MCA bifurcation. Distal right MCA branches well perfused. Left M1 patent proximally. 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 Upon review of prior MRI from 2010, this is likely chronic in nature. Stenosis measures approximately 3 mm in length. Left M1 patent distally. Normal left MCA bifurcation. Left MCA branches are opacified distally, although overall attenuated as compared to  the right due to the severe M1 stenosis. Posterior circulation: Vertebral arteries patent to the vertebrobasilar junction without stenosis. Posterior inferior cerebral arteries patent bilaterally. Proximal and mid basilar artery diminutive but widely patent without stenosis. Persistent left-sided trigeminal artery supplies the distal basilar artery which is more robust in caliber. Superior cerebral arteries patent bilaterally. Both of the posterior cerebral arteries primarily supplied via the basilar and are well perfused to their distal aspects. Venous sinuses: Grossly patent, although limited in assessment due to arterial timing the contrast bolus. Anatomic variants: Persistent left trigeminal artery. No intracranial  aneurysm. Delayed phase: Not performed. Review of the MIP images confirms the above findings CT Brain Perfusion Findings: CBF (<30%) Volume: 94mL Perfusion (Tmax>6.0s) volume: 158mL Mismatch Volume: 146mL Infarction Location:No core infarct identified by CT perfusion. Large perfusion mismatch throughout the left MCA territory due to the severe left M1 stenosis. IMPRESSION: 1. Negative CTA for large vessel occlusion. No core infarct evident by CT perfusion. 2. Short-segment severe mid left M1 stenosis, likely similar as compared to previous MRI from 11/19/2008. Associated delayed perfusion throughout the left MCA territory distally. 3. No other hemodynamically significant or correctable stenosis within the major arterial vasculature of the head and neck. 4. Persistent left trigeminal artery. Finding discussed with Dr. Leonel Ramsay by telephone at approximately 9:45 p.m. on 08/30/2018. Electronically Signed   By: Jeannine Boga M.D.   On: 08/30/2018 22:59   Mr Brain Wo Contrast  Result Date: 08/30/2018 CLINICAL DATA:  63 y/o  M; right-sided weakness. EXAM: MRI HEAD WITHOUT CONTRAST TECHNIQUE: Multiplanar, multiecho pulse sequences of the brain and surrounding structures were obtained without  intravenous contrast. COMPARISON:  08/30/2018 CT head, CT perfusion head, and CTA head. FINDINGS: Brain: Multiple foci of reduced diffusion are present within the left caudate body, left mid and posterior corona radiata, left posterior limb of internal capsule extending into left putamen, as well as several additional small foci of the temporoparietal periventricular white matter and the left frontal operculum compatible with acute/early subacute infarction. No associated hemorrhage or mass effect. Infarcts are T2 FLAIR hyperintense. No acute hemorrhage. No extra-axial collection, hydrocephalus, mass effect, or herniation. Small chronic infarcts in the right parietal cortex and right occipital periventricular white matter. Mild chronic microvascular ischemic changes of the brain. Mild brain parenchymal volume loss. Vascular: Please refer to CT angiogram of the head and neck. Skull and upper cervical spine: Normal marrow signal. Sinuses/Orbits: Right frontal, ethmoid, and maxillary sinus partial opacification and fluid level. No abnormal signal of the mastoid air cells. Orbits are unremarkable. Other: None. IMPRESSION: 1. Multiple small foci of acute/early subacute infarction are present in the left MCA distribution concentrated in left basal ganglia inclusive of the corona radiata and posterior limb of internal capsule. No hemorrhage or mass effect. 2. Mild chronic microvascular ischemic changes and volume loss of the brain. Small chronic infarcts in right parietal and occipital lobes. 3. Right frontal, ethmoid, and maxillary sinus disease is a right middle meatus obstructive pattern, direct visualization recommended. These results were called by telephone at the time of interpretation on 08/30/2018 at 10:46 pm to Dr. Leonel Ramsay, who verbally acknowledged these results. Electronically Signed   By: Kristine Garbe M.D.   On: 08/30/2018 22:48   Ct Cerebral Perfusion W Contrast  Result Date:  08/30/2018 CLINICAL DATA:  Initial evaluation for acute speech difficulty, right facial weakness. EXAM: CT ANGIOGRAPHY HEAD AND NECK CT PERFUSION BRAIN TECHNIQUE: Multidetector CT imaging of the head and neck was performed using the standard protocol during bolus administration of intravenous contrast. Multiplanar CT image reconstructions and MIPs were obtained to evaluate the vascular anatomy. Carotid stenosis measurements (when applicable) are obtained utilizing NASCET criteria, using the distal internal carotid diameter as the denominator. Multiphase CT imaging of the brain was performed following IV bolus contrast injection. Subsequent parametric perfusion maps were calculated using RAPID software. CONTRAST:  185mL ISOVUE-370 IOPAMIDOL (ISOVUE-370) INJECTION 76% COMPARISON:  Prior noncontrast CT from earlier same day. FINDINGS: CTA NECK FINDINGS Aortic arch: Visualized aortic arch of normal caliber with normal branch pattern. No hemodynamically significant stenosis about  the origin of the great vessels. Visualized subclavian arteries widely patent. Right carotid system: Right common carotid artery patent from its origin to the bifurcation without stenosis. Minimal mixed plaque about the right bifurcation without stenosis. Short-segment mild stenosis of approximately 20% by NASCET criteria just distally within the proximal right ICA. Right ICA otherwise widely patent to the skull base without stenosis, dissection, or occlusion. Left carotid system: Left common carotid artery patent from its origin to the bifurcation without stenosis. No significant atheromatous narrowing about the left bifurcation. Left ICA widely patent from the bifurcation to the skull base without stenosis, dissection, or occlusion. Vertebral arteries: Both of the vertebral arteries arise from the subclavian arteries. Vertebral arteries widely patent within the neck without stenosis, dissection, or occlusion. Skeleton: No acute osseous finding.  No discrete lytic or blastic osseous lesions. Mild to moderate cervical spondylolysis at C4-5 through C6-7. Prominent right-sided facet arthrosis noted at C3-4. 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 Soft tissues of the neck demonstrate no other acute finding. Upper chest: Layering fluid noted within the upper esophageal lumen. Visualized upper chest demonstrates no other acute finding. Partially visualized lungs are clear. Review of the MIP images confirms the above findings CTA HEAD FINDINGS Anterior circulation: Petrous ICAs widely patent bilaterally. Cavernous and supraclinoid segments widely patent without stenosis. 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 ICA termini well perfused distally. A1 segments patent bilaterally. Right A1 hypoplastic, likely accounting for the dominant left ICA as compared to the right. Normal anterior communicating artery. Anterior cerebral arteries widely patent to their distal aspects without stenosis. Right M1 widely patent without stenosis. Normal right MCA bifurcation. Distal right MCA branches well perfused. Left M1 patent proximally. 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 Upon review of prior MRI from 2010, this is likely chronic in nature. Stenosis measures approximately 3 mm in length. Left M1 patent distally. Normal left MCA bifurcation. Left MCA branches are opacified distally, although overall attenuated as compared to the right due to the severe M1 stenosis. Posterior circulation: Vertebral arteries patent to the vertebrobasilar junction without stenosis. Posterior inferior cerebral arteries patent bilaterally. Proximal and mid basilar artery diminutive but widely patent without stenosis. Persistent left-sided trigeminal artery supplies the distal basilar artery which is more robust in caliber. Superior cerebral  arteries patent bilaterally. Both of the posterior cerebral arteries primarily supplied via the basilar and are well perfused to their distal aspects. Venous sinuses: Grossly patent, although limited in assessment due to arterial timing the contrast bolus. Anatomic variants: Persistent left trigeminal artery. No intracranial aneurysm. Delayed phase: Not performed. Review of the MIP images confirms the above findings CT Brain Perfusion Findings: CBF (<30%) Volume: 57mL Perfusion (Tmax>6.0s) volume: 1106mL Mismatch Volume: 148mL Infarction Location:No core infarct identified by CT perfusion. Large perfusion mismatch throughout the left MCA territory due to the severe left M1 stenosis. IMPRESSION: 1. Negative CTA for large vessel occlusion. No core infarct evident by CT perfusion. 2. Short-segment severe mid left M1 stenosis, likely similar as compared to previous MRI from 11/19/2008. Associated delayed perfusion throughout the left MCA territory distally. 3. No other hemodynamically significant or correctable stenosis within the major arterial vasculature of the head and neck. 4. Persistent left trigeminal artery. Finding discussed with Dr. Leonel Ramsay by telephone at approximately 9:45 p.m. on 08/30/2018. Electronically Signed   By: Jeannine Boga M.D.   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 Does the need for close, 24 hr/day medical supervision in concert with the patient's rehab needs make it unreasonable for this patient to be served in a less intensive setting? Yes 2. Co-Morbidities requiring supervision/potential complications: Fall risk, cryptogenic stroke question underlying cardiac arrhythmia 3. Due to bladder management, bowel management, safety, skin/wound care, disease management, medication administration, pain management and patient education, does the patient require 24  hr/day rehab nursing? Yes 4. Does the patient require coordinated care of a physician, rehab nurse, PT (1-2 hrs/day, 5 days/week), OT (1-2 hrs/day, 5 days/week) and SLP (.5-1 hrs/day, 5 days/week) to address physical and functional deficits in the context of the above medical diagnosis(es)? Yes Addressing deficits in the following areas: balance, endurance, locomotion, strength, transferring, bowel/bladder control, bathing, dressing, feeding, grooming, toileting, cognition, speech, language and psychosocial support 5. Can the patient actively participate in an intensive therapy program of at least 3 hrs of therapy per day at least 5 days per week? Yes 6. The potential for patient to make measurable gains while on inpatient rehab is excellent 7. Anticipated functional outcomes upon discharge from inpatient rehab are supervision  with PT, supervision with OT, supervision and min assist with SLP. 8. Estimated rehab length of stay to reach the above functional goals is: 10-14d 9. Anticipated D/C setting: Home 10. Anticipated post D/C treatments: Outpatient therapy 11. Overall Rehab/Functional Prognosis: excellent  RECOMMENDATIONS: This patient's condition is appropriate for continued rehabilitative care in the following setting: CIR Patient has agreed to participate in recommended program. Yes Note that insurance prior authorization may be required for reimbursement for recommended care.  Comment:  "I have personally performed a face to face diagnostic evaluation of this patient.  Additionally, I have reviewed and concur with the physician assistant's documentation above." Edward Glass M.D. [facility] FAAPM&R (Sports Med, Neuromuscular Med) Diplomate Am Board of Electrodiagnostic Med  Elizabeth Sauer 08/31/2018        Revision History                        Routing History

## 2018-09-05 NOTE — Progress Notes (Signed)
Physical Therapy Treatment Patient Details Name: Edward Glass MRN: [id number] DOB: [birth year] Today's Date: 09/05/2018    History of Present Illness Patient is a 63 y/o male presenting to the ED on 08/30/2018 with primary complaints of aphasia, R facila droop and R sided weakness. CT revealing Age indeterminate small vessel ischemia in the left basal ganglia. 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 PMH significant for hypogonadism, possible BPH. Of note, pt with worsening symptoms on the morning of 2/7. CT scan of the head was obtained which shows slight extension of left MCA infarct but no hemorrhage or new unexpected finding.    PT Comments    Pt performed slow guarded movement but progressed to standing and gait training.  He required assistance to advance [name] forward as he is only able to initiate the movement.  Pt required +2 max assistance to ambulate.  Plan for CIR placement today.  Pt and spouse aware of his deficits and agreeable he cannot return home in this condition.   They are both receptive to CIR placement.      Follow Up Recommendations  CIR     Equipment Recommendations  None recommended by PT    Recommendations for Other Services Rehab consult;OT consult;Speech consult     Precautions / Restrictions Precautions Precautions: Fall Precaution Comments: R hemiplegia Restrictions Weight Bearing Restrictions: No    Mobility  Bed Mobility Overal bed mobility: Needs Assistance Bed Mobility: Rolling;Sidelying to Sit(to L side) Rolling: Mod assist Sidelying to sit: Mod assist;+2 for physical assistance       General bed mobility comments: Pt performed rolling to R side with cues for remembering to move R UE over, looking L and pushing with [name].  Pt required assistance for LE advancement and  trunk elevation to achieve sitting edge of bed.    Transfers Overall transfer level: Needs assistance Equipment used: 2 person hand held assist Transfers: Sit to/from Stand Sit to Stand: Min assist;+2 physical assistance Stand pivot transfers: Max assist;+2 physical assistance       General transfer comment: Pt able to achieve sitting with R UE over PTA's shoulder for support.  Pt utilized LUE hand held assistance.  When taking steps to pivot he require max assistance +2 for support.    Ambulation/Gait Ambulation/Gait assistance: +2 physical assistance;Max assist Gait Distance (Feet): 10 Feet Assistive device: 2 person hand held assist Gait Pattern/deviations: Step-to pattern;Decreased step length - right;Decreased step length - left;Decreased stance time - right;Decreased weight shift to right;Narrow base of support Gait velocity: decreased   General Gait Details: Pt required +2 physical assistance.  Pt is slow and guarded.  Required assistance to advance [name] forward.  Pt with decreased quad strength but no buckling on R side noted in stance phase.  Close chair follow for safety.     Stairs             Wheelchair Mobility    Modified Rankin (Stroke Patients Only)       Balance Overall balance assessment: Needs assistance   Sitting balance-Leahy Scale: Fair       Standing balance-Leahy Scale: Poor Standing balance comment: Mod assist for static standing balance with max assist for stepping.  Cognition Arousal/Alertness: Awake/alert Behavior During Therapy: Flat affect Overall Cognitive Status: Impaired/Different from baseline Area of Impairment: Following commands;Problem solving                       Following Commands: Follows one step commands inconsistently(recpetive difficulties vs. apraxia)       General Comments: difficult to assess due to aphasia; pt able to follow simple one-step commands but required  frequent verbal and tactile cueing      Exercises General Exercises - Lower Extremity Ankle Circles/Pumps: AROM;PROM;Both;15 reps;Supine Heel Slides: AAROM;Right;10 reps;Supine Hip ABduction/ADduction: AAROM;Right;10 reps;Supine Other Exercises Other Exercises: B self assisted shoulder flexion x10 reps. Other Exercises: R elbow flexion and extension with aarom x10 reps.      General Comments        Pertinent Vitals/Pain Pain Assessment: Faces Faces Pain Scale: Hurts little more Pain Location: right wrist  Pain Descriptors / Indicators: Discomfort Pain Intervention(s): Monitored during session;Repositioned    Home Living                      Prior Function            PT Goals (current goals can now be found in the care plan section) Acute Rehab PT Goals Patient Stated Goal: wife stated to d/c to rehab PT Goal Formulation: With patient/family Potential to Achieve Goals: Good Progress towards PT goals: Progressing toward goals    Frequency    Min 4X/week      PT Plan Current plan remains appropriate    Co-evaluation              AM-PAC PT "6 Clicks" Mobility   Outcome Measure  Help needed turning from your back to your side while in a flat bed without using bedrails?: A Little Help needed moving from lying on your back to sitting on the side of a flat bed without using bedrails?: A Little Help needed moving to and from a bed to a chair (including a wheelchair)?: A Lot Help needed standing up from a chair using your arms (e.g., wheelchair or bedside chair)?: A Lot Help needed to walk in hospital room?: A Lot Help needed climbing 3-5 steps with a railing? : Total 6 Click Score: 13    End of Session Equipment Utilized During Treatment: Gait belt Activity Tolerance: Patient tolerated treatment well Patient left: with bed alarm set;with call bell/phone within reach;with family/visitor present;in chair Nurse Communication: Mobility status PT Visit  Diagnosis: Unsteadiness on feet (R26.81);Other abnormalities of gait and mobility (R26.89);Muscle weakness (generalized) (M62.81)     Time: 1448-1856 PT Time Calculation (min) (ACUTE ONLY): 18 min  Charges:  $Gait Training: 8-22 mins                     Governor Rooks, PTA Acute Rehabilitation Services Pager [phone] Office [phone]     Edward Glass 09/05/2018, 12:33 PM

## 2018-09-05 NOTE — H&P (Signed)
Physical Medicine and Rehabilitation Admission H&P        Chief Complaint  Patient presents with  . Aphasia  : HPI: Edward Glass is a 63 year old right-handed male with history of hypogonadism, BPH. Per chart review lives alone. 2 level home to steps two entry. Patient works as a Radio producer.He does have family in the area with good support and plans to stay with his daughter on discharge.. Presented to 08/30/2018 with right-sided weakness/fall and slurred speech. Cranial CT scan showed age indeterminate small vessel ischemia in the left basal ganglia new since 2010. CT perfusion scan as well as CTA head and neck left M1 near occlusion. MRI confirmed multi small foci of acute early subacute infarction present in the left MCA distribution concentrated in the left basal ganglia. Patient did not receive TPA. Echocardiogram with ejection fraction of 60% and normal systolic function.Noted on 09/01/2018 patient with increase weakness of the right side and dysarthria with follow-up cranial CT scan showing more confluent acute infarct in the left basal ganglia and corona radiata when compared to diffusion imaging 2 days prior. No new distribution infarction or hemorrhagic conversion. Presently on aspirin and Plavix for CVA prophylaxis. Subcutaneous Lovenox for DVT prophylaxis. Modified barium swallow completed presently on a dysphagia #2 nectar thick liquid diet. TEE 05/15/2019 showed ejection fraction of 55% as well as a small PFO/bubble crossover noted during Valsalva. Loop recorder was also placed. Patient with x-rays of right hand after recent fall related to CVA showed possible fracture of the triquetrum with conservative care per orthopedic services Dr. Stann Mainland and weightbearing as tolerated and placed in a splint. Therapy evaluations completed with recommendations of physical medicine rehabilitation consult. Patient was admitted for a comprehensive rehabilitation program.   Review of  Systems  Constitutional: Negative for chills and fever.  HENT: Negative for hearing loss.   Eyes: Negative for blurred vision.  Respiratory: Negative for cough and shortness of breath.   Cardiovascular: Negative for chest pain and palpitations.  Gastrointestinal: Positive for constipation. Negative for nausea and vomiting.  Genitourinary: Positive for urgency. Negative for dysuria, flank pain and hematuria.  Musculoskeletal: Positive for myalgias.  Skin: Negative for rash.  Neurological: Positive for sensory change, speech change and focal weakness.  All other systems reviewed and are negative.       Past Medical History:  Diagnosis Date  . Atypical chest pain 08/10/2013  . Elevated aspartate aminotransferase level 06/16/2015  . Excessive salivation 06/16/2015  . H/O nutritional disorder 06/16/2015  . 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 Screening for prostate cancer 08/21/2014  . 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 HERNIA REPAIR   2006  . left tricep surgery             Family History  Problem Relation Age of Onset  . Kidney failure Mother    . Cancer Mother    . Prostate cancer Father    . Cancer Father    . Diabetes Father    . Colon cancer Neg Hx    . Esophageal cancer Neg Hx    . Rectal cancer Neg Hx    . Stomach cancer Neg Hx      Social History:  reports that he has never smoked. He has never used smokeless tobacco. He reports that he does not drink alcohol or use drugs. Allergies: No Known Allergies  Medications Prior to Admission  Medication Sig Dispense Refill  . alfuzosin (UROXATRAL) 10 MG 24 hr tablet Take 10 mg by mouth 3 (three) times a week.      Marland Kitchen aspirin 81 MG tablet Take 81 mg by mouth daily.      Marland Kitchen MILK THISTLE PO Take 2 tablets by mouth daily.      . Misc Natural Products (SAW PALMETTO) CAPS Take 2-4 capsules by mouth daily.      . multivitamin (ONE-A-DAY MEN'S)  TABS tablet Take 1 tablet by mouth daily.      . sildenafil (REVATIO) 20 MG tablet 2-5 pills as needed for ED symptoms (Patient not taking: Reported on 08/30/2018) 50 tablet 5      Drug Regimen Review Drug regimen was reviewed and remains appropriate with no significant issues identified   Home: Home Living Family/patient expects to be discharged to:: Private residence Living Arrangements: Alone Available Help at Discharge: Family Type of Home: House Home Access: Stairs to enter Technical brewer of Steps: 2 Entrance Stairs-Rails: None Home Layout: Two level Alternate Level Stairs-Number of Steps: 10 Alternate Level Stairs-Rails: Left Bathroom Shower/Tub: Multimedia programmer: Standard Home Equipment: Industrial/product designer History: Prior Function Level of Independence: Independent Comments: works as a Lobbyist Status:  Mobility: [place] bed mobility: Needs Assistance Bed Mobility: Rolling Rolling: Max assist, Mod assist Supine to sit: Max assist Sit to supine: Mod assist General bed mobility comments: Performed x 5 reps of rolling to L side.  Pt required Hand over hand placement to assist R UE.  Pt required cues to look R and PTA assisted [name] into hooklying to push through R LE to roll to the Left side.  mod-max assistance.   Transfers Overall transfer level: Needs assistance Equipment used: 2 person hand held assist Transfers: Sit to/from Stand, Stand Pivot Transfers Sit to Stand: Mod assist Stand pivot transfers: Max assist General transfer comment: pt progressing from mod A x2 to min A with sit<>stands; therapist blocking R knee throughout; pt with very poor eccentric control to return to sitting Ambulation/Gait Ambulation/Gait assistance: Mod assist, +2 physical assistance, +2 safety/equipment Gait Distance (Feet): 20 Feet Assistive device: 1 person hand held assist Gait Pattern/deviations: Step-to pattern, Decreased  step length - right, Decreased step length - left, Decreased stance time - right, Decreased weight shift to right, Narrow base of support General Gait Details: pt only able to take a few pivotal steps towards his L side with mod-max A x2 Gait velocity: decreased   ADL: ADL Overall ADL's : Needs assistance/impaired Eating/Feeding: Minimal assistance, Modified independent, Sitting Eating/Feeding Details (indicate cue type and reason): provided built-up foam for use with feeding utensils Grooming: Sitting, Moderate assistance Upper Body Bathing: Moderate assistance, Sitting Lower Body Bathing: Maximal assistance, Sit to/from stand Upper Body Dressing : Maximal assistance, Sitting Lower Body Dressing: Maximal assistance Toilet Transfer: Maximal assistance, Stand-pivot Toilet Transfer Details (indicate cue type and reason): walked halfway to bathroom this session Toileting- Clothing Manipulation and Hygiene: Maximal assistance Tub/ Shower Transfer: Moderate assistance, +2 for physical assistance Functional mobility during ADLs: Moderate assistance, +2 for physical assistance, +2 for safety/equipment General ADL Comments: Pt still with significant right UE and LE hemiparesis.  He also demonstrates right inattention and neglect.  Mod assist for rolling to the right side with max assist for sidelying to sit EOB.  He was able to maintain static sitting balance with min assist for dynamic sitting balance while  performing weightbearing tasks through the RUE and reaching to the right across his body with the LUE.  Max assist for sit to stand with max assist for taking 4-5 steps up toward the tops of the bed.  Pt with increased right wrist pain with flexion so wrist cock-up splint donned for support.  Educated wife on positioning of the splint and donning/doffing.  Also educated her on PROM exercises for the RUE shoulder, elbow, forearm, and fingers.  Pt returned to bed with mod assist in preparation for  transfer for TEE.     Cognition: Cognition Overall Cognitive Status: Impaired/Different from baseline Arousal/Alertness: Awake/alert Orientation Level: Oriented X4 Cognition Arousal/Alertness: Awake/alert Behavior During Therapy: Flat affect Overall Cognitive Status: Impaired/Different from baseline Area of Impairment: Following commands, Problem solving Following Commands: Follows one step commands inconsistently(receptive difficulties vs. apraxia) General Comments: difficult to assess due to aphasia; pt able to follow simple one-step commands but required frequent verbal and tactile cueing Difficult to assess due to: Impaired communication   Physical Exam: Blood pressure 134/77, pulse 65, temperature 99.8 F (37.7 C), temperature source Oral, resp. rate 18, height 5\' 5"  (1.651 m), weight 79.3 kg, SpO2 95 %. Physical Exam  Vitals reviewed. Constitutional: He appears well-developed. No distress.  63 year old right-handed male appearing younger than reported age  HENT:  Head: Normocephalic and atraumatic.  Eyes: Pupils are equal, round, and reactive to light. EOM are normal.     Neck: Normal range of motion. No tracheal deviation present. No thyromegaly present.  Cardiovascular: Normal rate and regular rhythm. Exam reveals no friction rub.  No murmur heard. Respiratory: Effort normal. No respiratory distress. He has no rales.  GI: Soft. He exhibits no distension. There is no abdominal tenderness.  Musculoskeletal: Normal range of motion.        General: No deformity or edema.  Neurological:  Alert. Expressive aphasia. Occasionally mutters an appropriate word or phrase, usually an automatic response. Speech of low volume and very dysarthric. Right central 7 and tongue deviation. Decreased gag. RUE 0/5. [name]: 1+ to 2- HF, KE and tr-0/5 ADF/PF. LUE and LLE 4-5/5. Decreased sense of LT and Pain RUE and [name]. DTR's 2+  Skin: Skin is warm and dry.  Psychiatric:  Flat but cooperative.        Lab Results Last 48 Hours        Results for orders placed or performed during the hospital encounter of 08/30/18 (from the past 48 hour(s))  CBC     Status: None    Collection Time: 09/04/18  7:44 PM  Result Value Ref Range    WBC 9.9 4.0 - 10.5 K/uL    RBC 5.53 4.22 - 5.81 MIL/uL    Hemoglobin 16.2 13.0 - 17.0 g/dL    HCT 49.5 39.0 - 52.0 %    MCV 89.5 80.0 - 100.0 fL    MCH 29.3 26.0 - 34.0 pg    MCHC 32.7 30.0 - 36.0 g/dL    RDW 12.5 11.5 - 15.5 %    Platelets 349 150 - 400 K/uL    nRBC 0.0 0.0 - 0.2 %      Comment: Performed at [facility], [place] [address], [place], [place] [address]  Creatinine, serum     Status: None    Collection Time: 09/04/18  7:44 PM  Result Value Ref Range    Creatinine, Ser 0.91 0.61 - 1.24 mg/dL    GFR calc non Af Amer >60 >60 mL/min  GFR calc Af Amer >60 >60 mL/min      Comment: Performed at [facility] [address], [place], [place] [address]       Imaging Results (Last 48 hours)  Dg Hand Complete Right   Result Date: 09/03/2018 CLINICAL DATA:  Pt was a recent admit into the hospital for a stroke. Pt fell 4 days ago and caught himself with his right hand. Pt c/o today of right hand pain and swelling even after the stroke affected the patient's right side. EXAM: RIGHT HAND - COMPLETE 3+ VIEW COMPARISON:  None. FINDINGS: There is subtle density proximal to the triquetrum/the deformed, also identified along the dorsum of the wrist on the LATERAL view. There is significant soft tissue swelling along the dorsum of the hand. IMPRESSION: 1. Soft tissue swelling. 2. Possible fracture of the triquetrum. Electronically Signed   By: Nolon Nations M.D.   On: 09/03/2018 12:49             Medical Problem List and Plan: 1.  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  DVT Prophylaxis/Anticoagulation:  Venous Dopplers lower extremity negative. Subcutaneous Lovenox for DVT prophylaxis 3. Pain Management:  Tylenol as needed. No pain at present 4. Mood:  Prozac 20 mg daily. Provide ego support as able 5. Neuropsych: This patient is capable of making decisions on his own behalf. 6. Skin/Wound Care:  Routine skin checks 7. Fluids/Electrolytes/Nutrition: Routine in and out's with follow-up chemistries 8. Dysphagia. Dysphagia #2 nectar thick liquids             -advance diet as appropriate 9. Hyperlipidemia. Lipitor 10. Permissive hypertension. Patient on no antihypertensive medications on admission 11. BPH. Uroxatral 10 mg 3 times a day once per day on Monday Wednesday Friday. Check PVRs 12. Possible right wrist fracture/Triquetrum.. Conservative care. Follow-up orthopedic service Dr. Stann Mainland. Weightbearing as tolerated with splint in place       Elizabeth Sauer 09/05/2018   Post Admission Physician Evaluation: 1. Functional deficits secondary  to left corona radiate/basal ganglia infarct. 2. Patient is admitted to receive collaborative, interdisciplinary care between the physiatrist, rehab nursing staff, and therapy team. 3. Patient's level of medical complexity and substantial therapy needs in context of that medical necessity cannot be provided at a lesser intensity of care such as a SNF. 4. Patient has experienced substantial functional loss from his/her baseline which was documented above under the "Functional History" and "Functional Status" headings.  Judging by the patient's diagnosis, physical exam, and functional history, the patient has potential for functional progress which will result in measurable gains while on inpatient rehab.  These gains will be of substantial and practical use upon discharge  in facilitating mobility and self-care at the household level. 5. Physiatrist will provide 24 hour management of medical needs as well as oversight of the therapy plan/treatment and  provide guidance as appropriate regarding the interaction of the two. 6. The Preadmission Screening has been reviewed and patient status is unchanged unless otherwise stated above. 7. 24 hour rehab nursing will assist with bladder management, bowel management, safety, skin/wound care, disease management, medication administration, pain management and patient education  and help integrate therapy concepts, techniques,education, etc. 8. PT will assess and treat for/with: Lower extremity strength, range of motion, stamina, balance, functional mobility, safety, adaptive techniques and equipment, NMR, pain  control, family ed.   Goals are: min assist. 9. OT will assess and treat for/with: ADL's, functional mobility, safety, upper extremity strength, adaptive techniques and equipment, NMR, family ed, ego support.   Goals are: min to mod assist. Therapy may proceed with showering this patient. 10. SLP will assess and treat for/with: cognition, language, family ed.  Goals are: supervision to min plus assist. 11. Case Management and Social Worker will assess and treat for psychological issues and discharge planning. 12. Team conference will be held weekly to assess progress toward goals and to determine barriers to discharge. 13. Patient will receive at least 3 hours of therapy per day at least 5 days per week. 14. ELOS: 18-24 days       15. Prognosis:  good   I have personally performed a face to face diagnostic evaluation of this patient and formulated the key components of the plan.  Additionally, I have personally reviewed laboratory data, imaging studies, as well as relevant notes and concur with the physician assistant's documentation above.  Meredith Staggers, MD, [facility]

## 2018-09-06 ENCOUNTER — Inpatient Hospital Stay (HOSPITAL_COMMUNITY): Payer: Self-pay

## 2018-09-06 ENCOUNTER — Inpatient Hospital Stay (HOSPITAL_COMMUNITY): Payer: BLUE CROSS/BLUE SHIELD | Admitting: Occupational Therapy

## 2018-09-06 ENCOUNTER — Inpatient Hospital Stay (HOSPITAL_COMMUNITY): Payer: BLUE CROSS/BLUE SHIELD | Admitting: Speech Pathology

## 2018-09-06 DIAGNOSIS — I69320 Aphasia following cerebral infarction: Secondary | ICD-10-CM

## 2018-09-06 DIAGNOSIS — I69351 Hemiplegia and hemiparesis following cerebral infarction affecting right dominant side: Principal | ICD-10-CM

## 2018-09-06 LAB — CBC WITH DIFFERENTIAL/PLATELET
Abs Immature Granulocytes: 0.04 10*3/uL (ref 0.00–0.07)
Basophils Absolute: 0.1 10*3/uL (ref 0.0–0.1)
Basophils Relative: 1 %
Eosinophils Absolute: 0.3 10*3/uL (ref 0.0–0.5)
Eosinophils Relative: 3 %
HCT: 45.7 % (ref 39.0–52.0)
Hemoglobin: 14.8 g/dL (ref 13.0–17.0)
IMMATURE GRANULOCYTES: 0 %
LYMPHS ABS: 2.2 10*3/uL (ref 0.7–4.0)
Lymphocytes Relative: 24 %
MCH: 28.9 pg (ref 26.0–34.0)
MCHC: 32.4 g/dL (ref 30.0–36.0)
MCV: 89.3 fL (ref 80.0–100.0)
Monocytes Absolute: 1.1 10*3/uL — ABNORMAL HIGH (ref 0.1–1.0)
Monocytes Relative: 11 %
Neutro Abs: 5.7 10*3/uL (ref 1.7–7.7)
Neutrophils Relative %: 61 %
Platelets: 335 10*3/uL (ref 150–400)
RBC: 5.12 MIL/uL (ref 4.22–5.81)
RDW: 12.4 % (ref 11.5–15.5)
WBC: 9.3 10*3/uL (ref 4.0–10.5)
nRBC: 0 % (ref 0.0–0.2)

## 2018-09-06 LAB — COMPREHENSIVE METABOLIC PANEL
ALT: 24 U/L (ref 0–44)
AST: 22 U/L (ref 15–41)
Albumin: 2.8 g/dL — ABNORMAL LOW (ref 3.5–5.0)
Alkaline Phosphatase: 51 U/L (ref 38–126)
Anion gap: 8 (ref 5–15)
BUN: 14 mg/dL (ref 8–23)
CO2: 29 mmol/L (ref 22–32)
Calcium: 8.6 mg/dL — ABNORMAL LOW (ref 8.9–10.3)
Chloride: 102 mmol/L (ref 98–111)
Creatinine, Ser: 0.89 mg/dL (ref 0.61–1.24)
GFR calc Af Amer: >60 mL/min (ref >60)
GFR calc non Af Amer: >60 mL/min (ref >60)
Glucose, Bld: 119 mg/dL — ABNORMAL HIGH (ref 70–99)
POTASSIUM: 4.1 mmol/L (ref 3.5–5.1)
Sodium: 139 mmol/L (ref 135–145)
Total Bilirubin: 0.7 mg/dL (ref 0.3–1.2)
Total Protein: 6.4 g/dL — ABNORMAL LOW (ref 6.5–8.1)

## 2018-09-06 MED ORDER — FLEET ENEMA 7-19 GM/118ML RE ENEM: 1.0000 | ENEMA | Freq: Every day | RECTAL | Status: DC | PRN

## 2018-09-06 NOTE — Evaluation (Signed)
Speech Language Pathology Assessment and Plan  Patient Details  Name: Edward Glass MRN: [id number] Date of Birth: 1955-07-27  SLP Diagnosis: Dysarthria;Cognitive Impairments;Dysphagia;Aphasia  Rehab Potential: Excellent ELOS: 19-25 days     Today's Date: 09/06/2018 SLP Individual Time: 2376-2831 SLP Individual Time Calculation (min): 60 min   Problem List:  Patient Active Problem List   Diagnosis Date Noted  . Left basal ganglia embolic stroke ([facility]) 51/76/1607  . CVA (cerebral vascular accident) ([place]) 08/31/2018  . Elevated aspartate aminotransferase level 06/16/2015  . H/O nutritional disorder 06/16/2015  . Excessive salivation 06/16/2015  . 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 Heart palpitations 06/16/2015  . 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 Screening for prostate cancer 08/21/2014  . Atypical chest pain 08/10/2013  . 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 Atypical chest pain 08/10/2013  . Elevated aspartate aminotransferase level 06/16/2015  . Excessive salivation 06/16/2015  . H/O nutritional disorder 06/16/2015  . 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 Screening for prostate cancer 08/21/2014  . 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 HERNIA REPAIR  2006  . left tricep surgery    . LOOP RECORDER INSERTION N/A 09/04/2018   Procedure: LOOP RECORDER INSERTION;  Surgeon: Evans Lance, MD;  Location: [facility];  Service: Cardiovascular;  Laterality: N/A;  . TEE WITHOUT CARDIOVERSION N/A 09/04/2018   Procedure: TRANSESOPHAGEAL ECHOCARDIOGRAM (TEE);  Surgeon: Jerline Pain, MD;  Location[facility] ENDOSCOPY;  Service: Cardiovascular;  Laterality: N/A;    Assessment / Plan / Recommendation Clinical Impression Patient is a 63 year old right-handed male with history of hypogonadism, BPH.  Per chart review lives alone. 2 level home to steps two entry. Patient works as a Physiological scientist. He does have family in the area with good supportand plans to stay with his daughter on discharge.. Presented to 08/30/2018 with right-sided weakness/falland slurred speech. Cranial CT scan showed age indeterminate small vessel ischemia in the left basal ganglia new since 2010. CT perfusion scan as well as CTA head and neck left M1 near occlusion. MRI confirmed multi small foci of acute early subacute infarction present in the left MCA distribution concentrated in the left basal ganglia. Patient did not receive TPA. Echocardiogram with ejection fraction of 60% and normal systolic function.Noted on 09/01/2018 patient with increase weakness of the right side and dysarthria with follow-up cranial CT scan showing more confluent acute infarct in the left basal ganglia and corona radiata when compared to diffusion imaging 2 days prior. No new distribution infarction or hemorrhagic conversion.Presently on aspirin and Plavix for CVA prophylaxis. Subcutaneous Lovenox for DVT prophylaxis. Modified barium swallow completed presently on a dysphagia #2 nectar thick liquid diet.TEE 05/15/2019 showed ejection fraction of 55%as well as a small PFO/bubble crossover noted during Valsalva. Loop recorder was also placed.Patient with x-rays of right hand after recent fall related to CVA showed possible fracture of the triquetrum with conservative care per orthopedic services Dr. Stann Mainland and weightbearing as tolerated and placed in a splint.Therapy evaluations completed with recommendations of physical medicine rehabilitation consult. Patient was admitted for a comprehensive rehabilitation program 09/05/18.  Patient demonstrates a moderate aphasia impacting verbal expression at the phrase level and auditory comprehension of mildly complex information.  Patient demonstrated intermittent semantic paraphasias with perseveration and  decreased awareness of errors. Patient's verbal expression is also impact by  decreased intelligibility due to a low vocal intensity and imprecise consonants from mile oral-motor weakness.  Mild cognitive deficits also noted in selective attention, functional problem solving and awareness.  Patient consumed trials of thin liquids and utilized multiple swallows with delayed cough with large cup sips. Patient's swallow was audible at times and appeared discoordinated, especially with liquids. Mild oral residue also noted with Dys. 2 textures due to weakness which required a cued liquid wash to clear. Oral care after meals was also utilized. Recommend patient continue current diet of Dys. 2 textures with nectar-thick liquids and initiate the water protocol via tsp. Patient would benefit from skilled SLP intervention to maximize his cognitive-linguistic, swallowing and speech function prior to discharge.    Skilled Therapeutic Interventions          Administered a BSE and cognitive-linguistic evaluation, please see above for details. Educated patient and his daughter in regards to his current speech, swallowing and cognitive-linguistic deficits and goals of skilled SLP intervention. Both verbalized understanding and agreement.     SLP Assessment  Patient will need skilled Speech Lanaguage Pathology Services during CIR admission    Recommendations  SLP Diet Recommendations: Free water protocol after oral care;Dysphagia 2 (Fine chop);Nectar(water protocol via tsp ) Liquid Administration via: Straw Medication Administration: Crushed with puree Supervision: Patient able to self feed;Full supervision/cueing for compensatory strategies Compensations: Slow rate;Small sips/bites;Multiple dry swallows after each bite/sip Postural Changes and/or Swallow Maneuvers: Seated upright 90 degrees Oral Care Recommendations: Oral care BID;Oral care before and after PO;Oral care prior to ice chip/H20 Recommendations for  Other Services: Neuropsych consult Patient destination: Home Follow up Recommendations: 24 hour supervision/assistance;Home Health SLP;Outpatient SLP Equipment Recommended: To be determined    SLP Frequency 3 to 5 out of 7 days   SLP Duration  SLP Intensity  SLP Treatment/Interventions 19-25 days   Minumum of 1-2 x/day, 30 to 90 minutes  Cognitive remediation/compensation;Environmental controls;Internal/external aids;Speech/Language facilitation;Therapeutic Activities;Patient/family education;Functional tasks;Dysphagia/aspiration precaution training;Cueing hierarchy    Pain No/Denies Pain   Prior Functioning Type of Home: House  Lives With: Alone Available Help at Discharge: Family  Short Term Goals: Week 1: SLP Short Term Goal 1 (Week 1): Patient will consume currnet diet with minimal overt s/s of aspiration with supervision level verbal cues for use of compensatory strategies.  SLP Short Term Goal 2 (Week 1): Patient will consume trials of thin liquids via cup with minimal overt s/s of aspiration over 2 sessions with supervision level verbal cues to assess readiness for repeat MBS.  SLP Short Term Goal 3 (Week 1): Patient will utilize speech intelligibility strategies to maximize intelligibility to ~75% at the phrase level with Min A verbal cues.  SLP Short Term Goal 4 (Week 1): Patient will express basic wants/needs at the phrase level with Min A multimodal cues.  SLP Short Term Goal 5 (Week 1): Patient will demonstrate selective attention to functional tasks for ~45 minutes with Min A verbal cues for redirection.  SLP Short Term Goal 6 (Week 1): Patient will demonstrate functional problem solving for basic and familiar tasks with Min A verbal cues.   Refer to Care Plan for Long Term Goals  Recommendations for other services: Neuropsych  Discharge Criteria: Patient will be discharged from SLP if patient refuses treatment 3 consecutive times without medical reason, if treatment  goals not met, if there is a change in medical status, if patient makes no progress towards goals or if patient is discharged from hospital.  The above assessment, treatment plan,  treatment alternatives and goals were discussed and mutually agreed upon: by patient and by family  Joshuan Bolander 09/06/2018, 2:51 PM

## 2018-09-06 NOTE — Progress Notes (Signed)
Orthopedic Tech Progress Note Patient Details:  Edward Glass [birth year] [id number]  Patient ID: [name], male   DOB: October 09, 1955, 63 y.o.   MRN: [id number]   [place] Pink 09/06/2018, 11:47 AMCalled [facility] for right Prafo.

## 2018-09-06 NOTE — Progress Notes (Signed)
Edward Glass PHYSICAL MEDICINE & REHABILITATION PROGRESS NOTE   Subjective/Complaints:  No issues overnite  ROS- No CP, SOB, N/V/D  Objective:   No results found. Recent Labs    09/04/18 1944 09/06/18 0606  WBC 9.9 9.3  HGB 16.2 14.8  HCT 49.5 45.7  PLT 349 335   Recent Labs    09/04/18 1944 09/06/18 0606  NA  --  139  K  --  4.1  CL  --  102  CO2  --  29  GLUCOSE  --  119*  BUN  --  14  CREATININE 0.91 0.89  CALCIUM  --  8.6*    Intake/Output Summary (Last 24 hours) at 09/06/2018 0840 Last data filed at 09/06/2018 0654 Gross per 24 hour  Intake 240 ml  Output 850 ml  Net -610 ml     Physical Exam: Vital Signs Blood pressure 136/81, pulse (!) 59, temperature 98.8 F (37.1 C), temperature source Oral, resp. rate 15, weight 74.7 kg, SpO2 95 %.   General: No acute distress Mood and affect are appropriate Heart: Regular rate and rhythm no rubs murmurs or extra sounds Lungs: Clear to auscultation, breathing unlabored, no rales or wheezes Abdomen: Positive bowel sounds, soft nontender to palpation, nondistended Extremities: No clubbing, cyanosis, or edema Skin: No evidence of breakdown, no evidence of rash Neurologic: Cranial nerves II through XII intact, motor strength is 4/5 in Left deltoid, bicep, tricep, grip, hip flexor, knee extensors, ankle dorsiflexor and plantar flexor Trace R finger flexion and biceps 2- hip adduction, knee ext, 0/5 in RIght ankle Sensory exam unable to assess secondary to aphasia Cerebellar exam unable to perform on RIght due to weakness Musculoskeletal: Full range of motion in all 4 extremities. No joint swelling   Assessment/Plan: 1. Functional deficits secondary to RIght hemiparesis and aphasia which require 3+ hours per day of interdisciplinary therapy in a comprehensive inpatient rehab setting.  Physiatrist is providing close team supervision and 24 hour management of active medical problems listed below.  Physiatrist and  rehab team continue to assess barriers to discharge/monitor patient progress toward functional and medical goals  Care Tool:  Bathing              Bathing assist       Upper Body Dressing/Undressing Upper body dressing        Upper body assist      Lower Body Dressing/Undressing Lower body dressing            Lower body assist       Toileting Toileting    Toileting assist Assist for toileting: Maximal Assistance - Patient 25 - 49%     Transfers Chair/bed transfer  Transfers assist           Locomotion Ambulation   Ambulation assist              Walk 10 feet activity   Assist           Walk 50 feet activity   Assist           Walk 150 feet activity   Assist           Walk 10 feet on uneven surface  activity   Assist           Wheelchair     Assist               Wheelchair 50 feet with 2 turns activity    Assist  Wheelchair 150 feet activity     Assist           Medical Problem List and Plan: 1.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 DVT Prophylaxis/Anticoagulation: Venous Dopplers lower extremity negative.Subcutaneous Lovenox for DVT prophylaxis 3. Pain Management:Tylenol as needed. No pain at present 4. Mood:Prozac 20 mg daily. Provide ego support as able 5. Neuropsych: This patientiscapable of making decisions on hisown behalf. 6. Skin/Wound Care:Routine skin checks 7. Fluids/Electrolytes/Nutrition:Routine in and out's with follow-up chemistries 8. Dysphagia. Dysphagia #2 nectar thick liquids- BMET normal-advance diet as appropriate 9. Hyperlipidemia. Lipitor 10. Permissive hypertension. Patient on no antihypertensive medications on admission 11.  BPH. Uroxatral 10 mg 3 times a day once per day on Monday Wednesday Friday. Check PVRs 12. Possible right wrist fracture/Triquetrum.. Conservative care. Follow-up orthopedic service Dr. Stann Mainland. Weightbearing as tolerated with splint in place  13.  Constipation unresponsive to supp or oral laxative, order Fleets today  LOS: 1 days A FACE TO FACE EVALUATION WAS PERFORMED  [name] 09/06/2018, 8:40 AM

## 2018-09-06 NOTE — Evaluation (Signed)
Physical Therapy Assessment and Plan  Patient Details  Name: Edward Glass MRN: [id number] Date of Birth: August 14, 1955  PT Diagnosis: Abnormal posture, Difficulty walking, Hemiparesis dominant, Impaired cognition and Muscle weakness Rehab Potential: Good ELOS: 21-25 days   Today's Date: 09/06/2018 PT Individual Time: 0900-1000 PT Individual Time Calculation (min): 60 min    Problem List:  Patient Active Problem List   Diagnosis Date Noted  . Left basal ganglia embolic stroke ([place]) 38/93/7342  . CVA (cerebral vascular accident) ([place]) 08/31/2018  . Elevated aspartate aminotransferase level 06/16/2015  . H/O nutritional disorder 06/16/2015  . Excessive salivation 06/16/2015  . 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 Heart palpitations 06/16/2015  . 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 Screening for prostate cancer 08/21/2014  . Atypical chest pain 08/10/2013  . 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 Atypical chest pain 08/10/2013  . Elevated aspartate aminotransferase level 06/16/2015  . Excessive salivation 06/16/2015  . H/O nutritional disorder 06/16/2015  . 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 Screening for prostate cancer 08/21/2014  . 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 HERNIA REPAIR  2006  . left tricep surgery    . LOOP RECORDER INSERTION N/A 09/04/2018   Procedure: LOOP RECORDER INSERTION;  Surgeon: Evans Lance, MD;  Location: [facility];  Service: Cardiovascular;  Laterality: N/A;  . TEE WITHOUT CARDIOVERSION N/A 09/04/2018   Procedure: TRANSESOPHAGEAL ECHOCARDIOGRAM (TEE);  Surgeon: Jerline Pain, MD;  Location[facility] ENDOSCOPY;  Service: Cardiovascular;  Laterality: N/A;    Assessment & Plan Clinical Impression: Patient is a 63 y.o. year old male with history of hypogonadism,  BPH. Per chart review lives alone. 2 level home to steps two entry. Patient works as a Radio producer.He does have family in the area with good supportand plans to stay with his daughter on discharge.. Presented to 08/30/2018 with right-sided weakness/falland slurred speech. Cranial CT scan showed age indeterminate small vessel ischemia in the left basal ganglia new since 2010. CT perfusion scan as well as CTA head and neck left M1 near occlusion. MRI confirmed multi small foci of acute early subacute infarction present in the left MCA distribution concentrated in the left basal ganglia. Patient did not receive TPA. Echocardiogram with ejection fraction of 60% and normal systolic function.Noted on 09/01/2018 patient with increase weakness of the right side and dysarthria with follow-up cranial CT scan showing more confluent acute infarct in the left basal ganglia and corona radiata when compared to diffusion imaging 2 days prior. No new distribution infarction or hemorrhagic conversion.Presently on aspirin and Plavix for CVA prophylaxis. Subcutaneous Lovenox for DVT prophylaxis. Modified barium swallow completed presently on a dysphagia #2 nectar thick liquid diet.TEE 05/15/2019 showed ejection fraction of 55%as well as a small PFO/bubble crossover noted during Valsalva. Loop recorder was also placed.Patient with x-rays of right hand after recent fall related to CVA showed possible fracture of the triquetrum with conservative care per orthopedic services Dr. Stann Mainland and weightbearing as tolerated and placed in a splint.Therapy evaluations completed with recommendations of physical medicine rehabilitation consult. Patient was admitted for a comprehensive rehabilitation program.  Patient transferred to CIR on 09/05/2018 .   Patient currently requires max with mobility secondary to muscle weakness, impaired timing and sequencing and decreased coordination, delayed processing and decreased standing  balance, decreased  postural control, hemiplegia and decreased balance strategies.  Prior to hospitalization, patient was independent  with mobility and lived with Alone in a House home.  Home access is discharging to daughters house with 5 STE through front, two steps through garage. (possibly getting a ramp built)Stairs to enter.  Patient will benefit from skilled PT intervention to maximize safe functional mobility, minimize fall risk and decrease caregiver burden for planned discharge home with 24 hour assist.  Anticipate patient will benefit from follow up [facility] at discharge.  PT - End of Session Activity Tolerance: Tolerates 30+ min activity with multiple rests PT Assessment Rehab Potential (ACUTE/IP ONLY): Good PT Patient demonstrates impairments in the following area(s): Balance;Edema;Endurance;Motor;Pain;Perception;Safety;Sensory PT Transfers Functional Problem(s): Bed Mobility;Bed to Chair;Car;Furniture;Floor PT Locomotion Functional Problem(s): Ambulation;Wheelchair Mobility;Stairs PT Plan PT Intensity: Minimum of 1-2 x/day ,45 to 90 minutes PT Frequency: 5 out of 7 days PT Duration Estimated Length of Stay: 21-25 days PT Treatment/Interventions: Ambulation/gait training;DME/adaptive equipment instruction;Balance/vestibular training;Cognitive remediation/compensation;Community reintegration;Discharge planning;Neuromuscular re-education;Splinting/orthotics;UE/LE Coordination activities;UE/LE Strength taining/ROM;Skin care/wound management;Functional mobility training;Functional electrical stimulation;Psychosocial support;Therapeutic Exercise;Wheelchair propulsion/positioning;Therapeutic Activities;Pain management;Disease management/prevention;Patient/family education;Stair training;Visual/perceptual remediation/compensation PT Transfers Anticipated Outcome(s): CGA PT Locomotion Anticipated Outcome(s): min assist PT Recommendation Recommendations for Other Services: Neuropsych consult Follow  Up Recommendations: Home health PT Patient destination: Home Equipment Recommended: To be determined  Skilled Therapeutic Intervention Evaluation completed (see details above and below) with education on PT POC and goals and individual treatment initiated with focus on functional mobility, gait, transfers, stroke education and prognosis education. Pt supine in bed upon PT arrival, agreeable to therapy tx and denies pain. Pt transferred to sitting EOB with max assist, verbal cues for techniques. Pt performed squat pivot to w/c with max assist and transported to the gym. Pt ambulated x 25 ft using L rail and max assist +2 for w/c follow, pt able to activate R quads functionally during stance phase, verbal cues for sequencing, Ace wrap used for DF increased foot clearance, manual facilitation for R swing phase. Pt transported to ortho gym and performed max assist squat pivot car transfer. Pt performed stand pivot to mat with max assist. Pt performed x 5 sit<>stands with max assist fading to mod assist, no UE support. Pt performed standing with RW and R hand orthosis, attempted to perform stepping in place with L LE but pt unable to initiate movement.  Pt transferred to w/c stand pivot max assist. Pt propelled w/c x 25 ft with min assist, cues for techniques and pt with difficulty coordinating. Pt left in w/c at end of session with needs in reach and family present.    PT Evaluation Precautions/Restrictions Precautions Precautions: Fall Precaution Comments: R hemiplegia Restrictions Weight Bearing Restrictions: No Pain Pain Assessment Pain Scale: 0-10 Pain Score: 0-No pain Faces Pain Scale: No hurt Home Living/Prior Functioning Home Living Available Help at Discharge: Family Type of Home: House Home Access: Stairs to enter [employer] of Steps: discharging to daughters house with 5 STE through front, two steps through garage. (possibly getting a ramp built) Entrance Stairs-Rails:  Right;Left  Lives With: Alone Cognition Overall Cognitive Status: Impaired/Different from baseline Arousal/Alertness: Awake/alert Orientation Level: Oriented X4 Sensation Sensation Light Touch: Impaired by gross assessment Proprioception: Not tested Additional Comments: difficult to assess sensation secondary to cognition Coordination Gross Motor Movements are Fluid and Coordinated: No Fine Motor Movements are Fluid and Coordinated: No Coordination and Movement Description: coordination impaired secondary to R hemiparesis Motor  Motor Motor: Hemiplegia Motor - Skilled Clinical Observations: R hemiparesis  Mobility Bed  Mobility Bed Mobility: Rolling Right;Rolling Left;Supine to Sit;Sit to Supine Rolling Right: Moderate Assistance - Patient 50-74% Rolling Left: Moderate Assistance - Patient 50-74% Supine to Sit: Maximal Assistance - Patient - Patient 25-49% Sit to Supine: Moderate Assistance - Patient 50-74% Transfers Transfers: Sit to Stand;Stand Pivot Transfers;Squat Pivot Transfers Sit to Stand: Moderate Assistance - Patient 50-74% Stand Pivot Transfers: Maximal Assistance - Patient 25 - 49% Stand Pivot Transfer Details: Verbal cues for technique;Verbal cues for precautions/safety;Manual facilitation for placement Squat Pivot Transfers: Maximal Assistance - Patient 25-49% Locomotion  Gait Ambulation: Yes Gait Assistance: 2 Helpers Gait Distance (Feet): 25 Feet Assistive device: Other (Comment)(rail) Gait Assistance Details: Verbal cues for technique;Verbal cues for precautions/safety;Verbal cues for sequencing;Verbal cues for gait pattern;Manual facilitation for weight bearing Gait Gait: Yes Gait Pattern: Impaired Gait Pattern: Decreased stance time - right;Step-to pattern;Decreased weight shift to right;Decreased dorsiflexion - right Gait velocity: decreased Stairs / Additional Locomotion Stairs: No Wheelchair Mobility Wheelchair Mobility: Yes Wheelchair Assistance:  Minimal assistance - Patient >75% Wheelchair Propulsion: Left upper extremity;Left lower extremity Wheelchair Parts Management: Needs assistance Distance: 20 ft  Trunk/Postural Assessment  Cervical Assessment Cervical Assessment: Within Functional Limits Thoracic Assessment Thoracic Assessment: Within Functional Limits Lumbar Assessment Lumbar Assessment: Exceptions to WFL(posterior pelvic tilt) Postural Control Postural Control: Deficits on evaluation Protective Responses: impaired  Balance Balance Balance Assessed: Yes Static Sitting Balance Static Sitting - Level of Assistance: 5: Stand by assistance Dynamic Sitting Balance Dynamic Sitting - Level of Assistance: 4: Min assist Static Standing Balance Static Standing - Level of Assistance: 3: Mod assist Dynamic Standing Balance Dynamic Standing - Level of Assistance: 2: Max assist Extremity Assessment  [name] Assessment [name] Assessment: Exceptions to [facility] Passive Range of Motion (PROM) Comments: DF limited to neutral  [name] Strength Right Hip Flexion: 1/5(seen functionally) Right Hip Extension: 1/5(seen functionally) Right Knee Flexion: 0/5 Right Knee Extension: 1/5(seen functionally) Right Ankle Dorsiflexion: 0/5 Right Ankle Plantar Flexion: 0/5 LLE Assessment LLE Assessment: Within Functional Limits General Strength Comments: grossly 5/5 throughout    Refer to Care Plan for Long Term Goals  Recommendations for other services: Neuropsych  Discharge Criteria: Patient will be discharged from PT if patient refuses treatment 3 consecutive times without medical reason, if treatment goals not met, if there is a change in medical status, if patient makes no progress towards goals or if patient is discharged from hospital.  The above assessment, treatment plan, treatment alternatives and goals were discussed and mutually agreed upon: by patient  Netta Corrigan, PT, DPT 09/06/2018, 12:18 PM

## 2018-09-06 NOTE — Evaluation (Signed)
Occupational Therapy Assessment and Plan  Patient Details  Name: Edward Glass MRN: [id number] Date of Birth: 01-Sep-1955  OT Diagnosis: acute pain, apraxia, cognitive deficits, hemiplegia affecting dominant side and muscle weakness (generalized) Rehab Potential: Rehab Potential (ACUTE ONLY): Good ELOS: 19-25 days   Today's Date: 09/06/2018 OT Individual Time: 1300-1400 OT Individual Time Calculation (min): 60 min     Problem List:  Patient Active Problem List   Diagnosis Date Noted  . Left basal ganglia embolic stroke ([place]) 89/37/3428  . CVA (cerebral vascular accident) ([place]) 08/31/2018  . Elevated aspartate aminotransferase level 06/16/2015  . H/O nutritional disorder 06/16/2015  . Excessive salivation 06/16/2015  . 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 Heart palpitations 06/16/2015  . 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 Screening for prostate cancer 08/21/2014  . Atypical chest pain 08/10/2013  . 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 Atypical chest pain 08/10/2013  . Elevated aspartate aminotransferase level 06/16/2015  . Excessive salivation 06/16/2015  . H/O nutritional disorder 06/16/2015  . 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 Screening for prostate cancer 08/21/2014  . 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 HERNIA REPAIR  2006  . left tricep surgery    . LOOP RECORDER INSERTION N/A 09/04/2018   Procedure: LOOP RECORDER INSERTION;  Surgeon: Evans Lance, MD;  Location: [facility];  Service: Cardiovascular;  Laterality: N/A;  . TEE WITHOUT CARDIOVERSION N/A 09/04/2018   Procedure: TRANSESOPHAGEAL ECHOCARDIOGRAM (TEE);  Surgeon: Jerline Pain, MD;  Location: [facility] ENDOS[facility];  Service: Cardiovascular;  Laterality: N/A;    Assessment & Plan Clinical Impression: Patient is a 63 y.o.  year old male right-handed male with history of hypogonadism, BPH. Per chart review lives alone. 2 level home to steps two entry. Patient works as a Radio producer.He does have family in the area with good supportand plans to stay with his daughter on discharge.. Presented to 08/30/2018 with right-sided weakness/falland slurred speech. Cranial CT scan showed age indeterminate small vessel ischemia in the left basal ganglia new since 2010. CT perfusion scan as well as CTA head and neck left M1 near occlusion. MRI confirmed multi small foci of acute early subacute infarction present in the left MCA distribution concentrated in the left basal ganglia. Patient did not receive TPA. Echocardiogram with ejection fraction of 60% and normal systolic function.Noted on 09/01/2018 patient with increase weakness of the right side and dysarthria with follow-up cranial CT scan showing more confluent acute infarct in the left basal ganglia and corona radiata when compared to diffusion imaging 2 days prior. No new distribution infarction or hemorrhagic conversion.Presently on aspirin and Plavix for CVA prophylaxis. Subcutaneous Lovenox for DVT prophylaxis. Modified barium swallow completed presently on a dysphagia #2 nectar thick liquid diet.TEE 05/15/2019 showed ejection fraction of 55%as well as a small PFO/bubble crossover noted during Valsalva. Loop recorder was also placed.Patient with x-rays of right hand after recent fall related to CVA showed possible fracture of the triquetrum with conservative care per orthopedic services Dr. Stann Mainland and weightbearing as tolerated and placed in a splint. Patient transferred to CIR on 09/05/2018 .    Patient currently requires max with basic self-care skills and functional transfers secondary to muscle weakness, decreased cardiorespiratoy endurance and acute pain, impaired timing and sequencing, abnormal tone, unbalanced muscle activation, motor apraxia, decreased  coordination  and decreased motor planning, decreased attention to right, decreased attention, decreased awareness, decreased problem solving, decreased safety awareness and delayed processing and decreased sitting/ standing balance and decr safety awareness.  Prior to hospitalization, patient could complete ADL with independent .  Patient will benefit from skilled intervention to decrease level of assist with basic self-care skills and increase independence with basic self-care skills prior to discharge home with care partner.  Anticipate patient will require 24 hour supervision and follow up outpatient.  OT - End of Session Activity Tolerance: Tolerates 30+ min activity with multiple rests Endurance Deficit: Yes OT Assessment Rehab Potential (ACUTE ONLY): Good OT Patient demonstrates impairments in the following area(s): Balance;Motor;Sensory;Cognition;Pain;Vision;Edema;Perception;Endurance;Safety OT Basic ADL's Functional Problem(s): Grooming;Bathing;Dressing;Toileting OT Transfers Functional Problem(s): Toilet;Tub/Shower OT Additional Impairment(s): Fuctional Use of Upper Extremity OT Plan OT Intensity: Minimum of 1-2 x/day, 45 to 90 minutes OT Frequency: 5 out of 7 days OT Duration/Estimated Length of Stay: 19-25 days OT Treatment/Interventions: Balance/vestibular training;Community reintegration;Disease mangement/prevention;Functional electrical stimulation;Neuromuscular re-education;Patient/family education;Self Care/advanced ADL retraining;Splinting/orthotics;Therapeutic Exercise;UE/LE Coordination activities;Wheelchair propulsion/positioning;Cognitive remediation/compensation;Discharge planning;DME/adaptive equipment instruction;Functional mobility training;Pain management;Psychosocial support;Skin care/wound managment;Therapeutic Activities;UE/LE Strength taining/ROM;Visual/perceptual remediation/compensation OT Self Feeding Anticipated Outcome(s): n/a OT Basic Self-Care Anticipated Outcome(s):  supervision to contact guard OT Toileting Anticipated Outcome(s): contact guard OT Bathroom Transfers Anticipated Outcome(s): contact guard OT Recommendation Recommendations for Other Services: Neuropsych consult Patient destination: Home Follow Up Recommendations: Outpatient OT;24 hour supervision/assistance Equipment Recommended: To be determined   Skilled Therapeutic Intervention Ot eval initiated with OT goals, purpose and role discussed with pt and pt's daughter. Self care retraining at shower level. Pt able to demonstrate active right LE movement with testing in supine and at EOB with extra time and after demonstration. Pt did demonstrate difficulty with motor planning with pivoting to w/c; requiring max A.  Transitioned to the bathroom and max A into the shower. Pt noted to required mod cues to attention right body and to make eye contact with this clinician when on the right. Pt sit to stands with mod A with decr knee control but able to activate quad and hip flexors!. Max A transfer out of bathroom. Education and demonstrated on hemi dressing techniques sit to stand with focus on symmetrical posture with mirror feedback.  Brushed teeth at sink with evidence of pocketing some food. Left sitting up in the w.c with family and friends.   OT Evaluation Precautions/Restrictions  Precautions Precautions: Fall Precaution Comments: R hemiplegia; wrist in splint due to fx; shoulder pain Restrictions Weight Bearing Restrictions: No General Chart Reviewed: Yes Family/Caregiver Present: Yes(daughter) Vital Signs Therapy Vitals Pulse Rate: 74 Resp: 18 BP: 125/80 Patient Position (if appropriate): Sitting Oxygen Therapy SpO2: 96 % O2 Device: Room Air Pain Pain Assessment Faces Pain Scale: Hurts whole lot Pain Type: Acute pain Pain Location: Shoulder Pain Orientation: Right;Upper Pain Intervention(s): Repositioned;MD notified (Comment);Relaxation;Rest;Shower Home Living/Prior  Functioning Home Living Family/patient expects to be discharged to:: Private residence Living Arrangements: Alone Available Help at Discharge: Family Type of Home: House Home Access: Stairs to enter [employer] of Steps: discharging to daughters house with 5 STE through front, two steps through garage.  Entrance Stairs-Rails: Right, Left Home Layout: Two level Alternate Level Stairs-Number of Steps: 10 Alternate Level Stairs-Rails: Left Bathroom Shower/Tub: Multimedia programmer: Standard  Lives With: Alone ADL ADL Grooming: Minimal assistance Where Assessed-Grooming: Sitting at sink Upper Body Bathing: Minimal assistance Where Assessed-Upper Body Bathing: Shower Lower Body Bathing: Moderate assistance Where Assessed-Lower Body Bathing: Shower Upper Body Dressing: Maximal assistance  Where Assessed-Upper Body Dressing: Wheelchair Lower Body Dressing: Maximal assistance Where Assessed-Lower Body Dressing: Teaching laboratory technician: Maximal Firefighter Method: Radiographer, therapeutic: Gaffer Baseline Vision/History: Wears glasses Patient Visual Report: No change from baseline Saccades: Additional eye shifts occurred during testing Perception  Perception: Impaired Inattention/Neglect: Does not attend to right visual field;Does not attend to right side of body Praxis Praxis: Impaired Praxis Impairment Details: Motor planning Cognition Overall Cognitive Status: Impaired/Different from baseline Arousal/Alertness: Awake/alert Orientation Level: Person;Place;Situation Person: Oriented Place: Oriented Situation: Oriented Year: 2020 Month: February Day of Week: Correct Memory: Appears intact Immediate Memory Recall: Sock;Blue;Bed Memory Recall: Sock;Blue;Bed Memory Recall Sock: With Cue Memory Recall Blue: With Cue Memory Recall Bed: With Cue Attention: Sustained;Selective Sustained  Attention: Appears intact Selective Attention: Impaired Selective Attention Impairment: Functional complex Awareness: Impaired Awareness Impairment: Emergent impairment Problem Solving: Impaired Safety/Judgment: Impaired Sensation Sensation Light Touch: Impaired by gross assessment Hot/Cold: Appears Intact Proprioception: Impaired Detail Proprioception Impaired Details: Impaired [name] Additional Comments: difficult to assess sensation secondary to cognition Coordination Gross Motor Movements are Fluid and Coordinated: No Fine Motor Movements are Fluid and Coordinated: No Coordination and Movement Description: coordination impaired secondary to R hemiparesis Motor  Motor Motor: Hemiplegia;Motor apraxia Motor - Skilled Clinical Observations: R hemiparesis Mobility  Bed Mobility Bed Mobility: Rolling Right;Rolling Left;Supine to Sit;Sit to Supine Rolling Right: Moderate Assistance - Patient 50-74% Rolling Left: Moderate Assistance - Patient 50-74% Supine to Sit: Maximal Assistance - Patient - Patient 25-49% Sit to Supine: Moderate Assistance - Patient 50-74% Transfers Sit to Stand: Moderate Assistance - Patient 50-74%  Trunk/Postural Assessment  Cervical Assessment Cervical Assessment: Within Functional Limits Thoracic Assessment Thoracic Assessment: Within Functional Limits Lumbar Assessment Lumbar Assessment: Exceptions to WFL(posterior pelvic tilt) Postural Control Postural Control: Deficits on evaluation Protective Responses: impaired/ delayed  Balance Balance Balance Assessed: Yes Static Sitting Balance Static Sitting - Level of Assistance: 5: Stand by assistance Dynamic Sitting Balance Dynamic Sitting - Level of Assistance: 4: Min assist;3: Mod assist Static Standing Balance Static Standing - Level of Assistance: 3: Mod assist Dynamic Standing Balance Dynamic Standing - Level of Assistance: 2: Max assist Extremity/Trunk Assessment RUE Assessment RUE Assessment:  Exceptions to [facility] Passive Range of Motion (PROM) Comments: shoulder only able to tolerate 0-30 degress of flexion before indicating pain; elbow- WFL RUE Body System: Neuro Brunstrum levels for arm and hand: Hand;Arm Brunstrum level for arm: Stage II Synergy is developing Brunstrum level for hand: Stage II Synergy is developing RUE Tone RUE Tone: Modified Ashworth Body Part - Modified Ashworth Scale: Elbow;Wrist;Fingers;Thumb Elbow - Modified Ashworth Scale for Grading Hypertonia RUE: Slight increase in muscle tone, manifested by a catch and release or by minimal resistance at the end of the range of motion when the affected part(s) is moved in flexion or extension Wrist - Modified Ashworth Scale for Grading Hypertonia RUE: (unable to test due to fx) Fingers - Modified Ashworth Scale for Grading Hypertonia RUE: Slight increase in muscle tone, manifested by a catch and release or by minimal resistance at the end of the range of motion when the affected part(s) is moved in flexion or extension Thumb - Modified Ashworth Scale for Grading Hypertonia RUE: Slight increase in muscle tone, manifested by a catch and release or by minimal resistance at the end of the range of motion when the affected part(s) is moved in flexion or extension LUE Assessment LUE Assessment: Within Functional Limits     Refer to Care Plan  for Long Term Goals  Recommendations for other services: Neuropsych   Discharge Criteria: Patient will be discharged from OT if patient refuses treatment 3 consecutive times without medical reason, if treatment goals not met, if there is a change in medical status, if patient makes no progress towards goals or if patient is discharged from hospital.  The above assessment, treatment plan, treatment alternatives and goals were discussed and mutually agreed upon: by patient and by family  Nicoletta Ba 09/06/2018, 2:39 PM

## 2018-09-06 NOTE — Progress Notes (Signed)
Occupational Therapy Session Note  Patient Details  Name: Edward Glass MRN: [id number] Date of Birth: [birth year]  Today's Date: 09/06/2018 OT Individual Time: 5009-3818 OT Individual Time Calculation (min): 37 min    Short Term Goals: Week 1:  OT Short Term Goal 1 (Week 1): Pt will transfer to/ from toilet with mod A consistently OT Short Term Goal 2 (Week 1): Pt will don UB clothing with min A  OT Short Term Goal 3 (Week 1): Pt with perform one grooming task in standing with mod A for balance OT Short Term Goal 4 (Week 1): Pt will perform bed mobility in prep for ADL with mod A (to come to EOB) OT Short Term Goal 5 (Week 1): Pt will demonstrate safe placement of right UE at rest.   Skilled Therapeutic Interventions/Progress Updates:   Therapy session 3:00-3:37 - patient seated in w/c with daughter present, he denies pain in this position.  Reviewed optimal positioning for motor recovery and healing fracture in wrist.  Provided solid seat to improve trunk position relative to arm rest , provided right arm support and instructed on methods to elevate hand.  Patient and daughter verbalized understanding.  Standing at bed rail with left UE support - mod A sit to stand, min A to maintain with weight shifts and balance activity.  SPT w/c to bed with mod A and cues for weight shift, SSP to supine with min A for right LE.  Reviewed bed positioning and provided ice pack for right shoulder.  Patient remained in bed with bed alarm set and call bell in reach.    Balance/vestibular training;Community reintegration;Disease mangement/prevention;Functional electrical stimulation;Neuromuscular re-education;Patient/family education;Self Care/advanced ADL retraining;Splinting/orthotics;Therapeutic Exercise;UE/LE Coordination activities;Wheelchair propulsion/positioning;Cognitive remediation/compensation;Discharge planning;DME/adaptive equipment instruction;Functional mobility training;Pain  management;Psychosocial support;Skin care/wound managment;Therapeutic Activities;UE/LE Strength taining/ROM;Visual/perceptual remediation/compensation   Therapy Documentation Precautions:  Precautions Precautions: Fall Precaution Comments: R hemiplegia; wrist in splint due to fx; shoulder pain Restrictions Weight Bearing Restrictions: No General: General Chart Reviewed: Yes Family/Caregiver Present: Yes(daughter) Vital Signs: Therapy Vitals Pulse Rate: 74 Resp: 18 BP: 125/80 Patient Position (if appropriate): Sitting Oxygen Therapy SpO2: 96 % O2 Device: Room Air Pain: Pain Assessment Pain Scale: 0-10 Pain Score: 0-No pain Faces Pain Scale: Hurts whole lot Pain Type: Acute pain Pain Location: Shoulder Pain Orientation: Right;Upper Pain Intervention(s): Repositioned;MD notified (Comment);Relaxation;Rest;Shower   Therapy/Group: Individual Therapy  Carlos Levering 09/06/2018, 3:56 PM

## 2018-09-07 ENCOUNTER — Inpatient Hospital Stay (HOSPITAL_COMMUNITY): Payer: BLUE CROSS/BLUE SHIELD | Admitting: Physical Therapy

## 2018-09-07 ENCOUNTER — Inpatient Hospital Stay (HOSPITAL_COMMUNITY): Payer: BLUE CROSS/BLUE SHIELD | Admitting: Occupational Therapy

## 2018-09-07 ENCOUNTER — Inpatient Hospital Stay (HOSPITAL_COMMUNITY): Payer: BLUE CROSS/BLUE SHIELD | Admitting: Speech Pathology

## 2018-09-07 NOTE — Progress Notes (Signed)
Social Work Patient ID: Edward Glass, male   DOB: 13-Mar-1956, 63 y.o.   MRN: [id number]   CSW updated pt, pt's significant other, pt's brother, and friend on team conference discussion when CSW met pt for assessment.  Pt is aware that he will need about 3 weeks on CIR, but he hopes to progress faster than expected.  Pt has good support.  Pt will have supervision/min a level goals.  Pt's s.o. stated that pt's plan is to go to his dtr's, Edward Glass, home at d/c.  CSW will continue to follow and assist as needed.

## 2018-09-07 NOTE — Patient Care Conference (Signed)
Inpatient RehabilitationTeam Conference and Plan of Care Update Date: 09/06/2018   Time: 11:15 AM    Patient Name: Edward Glass      Medical Record Number: [id number]  Date of Birth: 1956/01/30 Sex: Male         Room/Bed: 4W21C/4W21C-01 Payor Info: Payor: [place] / Plan: [employer] OTHER / Product Type: *No Product type* /    Admitting Diagnosis: CVA  Admit Date/Time:  09/05/2018  1:37 PM Admission Comments: No comment available   Primary Diagnosis:  <principal problem not specified> Principal Problem: <principal problem not specified>  Patient Active Problem List   Diagnosis Date Noted  . Left basal ganglia embolic stroke ([place]) 24/23/5361  . CVA (cerebral vascular accident) ([place]) 08/31/2018  . Elevated aspartate aminotransferase level 06/16/2015  . H/O nutritional disorder 06/16/2015  . Excessive salivation 06/16/2015  . 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 Heart palpitations 06/16/2015  . 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 Screening for prostate cancer 08/21/2014  . Atypical chest pain 08/10/2013  . 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    Expected Discharge Date: Expected Discharge Date: (about 3 weeks)  Team Members Present: Physician leading conference: Dr. Alysia Penna Social Worker Present: Alfonse Alpers, LCSW Nurse Present: Ellison Carwin, LPN PT Present: Michaelene Song, PT OT Present: Willeen Cass, OT SLP Present: Weston Anna, SLP PPS Coordinator present : Gunnar Fusi     Current Status/Progress Goal Weekly Team Focus  Medical   Right hemiparesis which has progressed since initial consult, severe dysarthria with aphasia  Reduce stroke recurrence risk, reduce fall risk  Initiate rehabilitation program   Bowel/Bladder   incontinent of bladder continent/incontinent of bowel constipation LBM 02/04  continent of B/B normal bowel pattern   timed toileting laxatives prn    Swallow/Nutrition/ Hydration   Dys. 2 textures  with nectar-thick liquids with water protocol, Full supervision   Mod I  trials of upgraded liquids/solids    ADL's   eval pending eval pending eval pending  Mobility     mod-max for transfers and bed mobility, +2 for gait      supervision-min assist  R NMR, balance, gait, transfers, education   Communication   Mod-Max A  Supervision  increased speech intelligibility, word-finding, increasing length of utterance    Safety/Cognition/ Behavioral Observations  Mod A   Supervision  problem solving, awareness    Pain   denies any pain  free of pain  assess pain q shift and prn   Skin   steri strips to chest (loop recorder site)  ecchymosis abdomen  skin intact free of infection resolution of current issue  assess skin qshift and prn    Rehab Goals Patient on target to meet rehab goals: Yes Rehab Goals Revised: none, as this is pt's first [place] and progress notes for long and short-term goals.     Barriers to Discharge  Current Status/Progress Possible Resolutions Date Resolved   Physician    Medical stability     Initial eval's in progress  See above      Nursing                  PT                    OT                  SLP  SW                Discharge Planning/Teaching Needs:  Pt will d/c to his dtr's, [name], home.  She works from home and will be with pt, but other family and friends are available to assist as needed.  Family education will be offered closer to d/c.   Team Discussion:  Pt's UE is okay, but LE is worse.  Pt was in great shape before the stroke, works as a Physiological scientist.  Pt using a wrist splint for his wrist fx.  Pt c/o of wrist pain and likes keeping the splint on.  Pt is incontinent of bowel and bladder.  Pt's txs are max A and therapy recommends that nursing use the stedy for txs.  Pt can walk at rail 25', but had trouble picking up his left foot.  ST said that pt is doing better from a communication standpoint  than documentation showed.  Pt can do 2 step commands and talk at the phrase level.  Pt can name functional objects.  He is dysarthric and low vocal intensity, but was soft spoken prior to stroke.  Pt is on D2 with nectar thick liquids and is starting water protocol via teaspoons and ST hopes to upgrade soon.  Revisions to Treatment Plan:  none    Continued Need for Acute Rehabilitation Level of Care: The patient requires daily medical management by a physician with specialized training in physical medicine and rehabilitation for the following conditions: Daily direction of a multidisciplinary physical rehabilitation program to ensure safe treatment while eliciting the highest outcome that is of practical value to the patient.: Yes Daily medical management of patient stability for increased activity during participation in an intensive rehabilitation regime.: Yes Daily analysis of laboratory values and/or radiology reports with any subsequent need for medication adjustment of medical intervention for : Neurological problems   I attest that I was present, lead the team conference, and concur with the assessment and plan of the team.   Amyah Clawson, Silvestre Mesi 09/07/2018, 11:02 PM

## 2018-09-07 NOTE — Progress Notes (Signed)
[facility] Individual Statement of Services  Patient Name:  Edward Glass  Date:  09/07/2018  Welcome to the [facility].  Our goal is to provide you with an individualized program based on your diagnosis and situation, designed to meet your specific needs.  With this comprehensive rehabilitation program, you will be expected to participate in at least 3 hours of rehabilitation therapies Monday-Friday, with modified therapy programming on the weekends.  Your rehabilitation program will include the following services:  Physical Therapy (PT), Occupational Therapy (OT), Speech Therapy (ST), 24 hour per day rehabilitation nursing, Neuropsychology, Case Management (Social Worker), Rehabilitation Medicine, Nutrition Services and Pharmacy Services  Weekly team conferences will be held on Wednesdays to discuss your progress.  Your Social Worker will talk with you frequently to get your input and to update you on team discussions.  Team conferences with you and your family in attendance may also be held.  Expected length of stay:  About 3 weeks  Overall anticipated outcome:  Supervision/minimal assistance  Depending on your progress and recovery, your program may change. Your Social Worker will coordinate services and will keep you informed of any changes. Your Social Worker's name and contact numbers are listed  below.  The following services may also be recommended but are not provided by the [facility] will be made to provide these services after discharge if needed.  Arrangements include referral to agencies that provide these services.  Your insurance has been verified to be:  [employer] Your primary doctor is:  New PCP in [place]  Pertinent information will be shared with  your doctor and your insurance company.  Social Worker:  Alfonse Alpers, LCSW  [phone] or ([phone]  Information discussed with and copy given to patient by: Trey Sailors, 09/07/2018, 10:51 PM

## 2018-09-07 NOTE — Progress Notes (Signed)
Physical Therapy Session Note  Patient Details  Name: Edward Glass MRN: [id number] Date of Birth: [birth year]  Today's Date: 09/07/2018 PT Individual Time: 1000-1055 AND 9381-8299 PT Individual Time Calculation (min): 55 min AND 54 min  Short Term Goals: Week 1:  PT Short Term Goal 1 (Week 1): Pt will perform bed<>chair transfer with mod assist PT Short Term Goal 2 (Week 1): Pt sill perform sit<>stand with min assist PT Short Term Goal 3 (Week 1): Pt will propel w/c x 50 ft with supervision PT Short Term Goal 4 (Week 1): Pt will ambulate x 30 ft with LRAD and mod assist  Skilled Therapeutic Interventions/Progress Updates:   Session 1:  Pt in supine and agreeable to therapy, no c/o pain. Max assist transfer to EOB and mod assist stand pivot to w/c. Pt requesting to void in urinal, set-up assist and pt spent a few minutes attempting to void. Ultimately unable to do so and agreeable to let therapist know if he wanted to try again. Total assist w/c transport to/from therapy gym. Focused on gait training and [name] NMR. Sit<>stands to RW w/ mod assist and visual cues for technique for increased anterior weight shifting. Pre-gait tasks including L forward and backward stepping to visual target w/ tactile cues for R quad activation in stance. Added shoe cap to [name] and worked on R hip flexion while kicking ball, total assist at first fading to supervision w/ repetition. Ambulated 15', 18', and 50' w/ RW and RUE orthosis and min-mod assist overall. Shoe cap left on R foot for ease w/ foot clearance, pt needing max assist a first to progress [name], supervision starting w/ 2nd walk. No assist needed to block R knee in stance. Mod assist for RW management. Seated rest breaks 2/2 fatigue, provided w/ spoonfuls of thin liquid, no coughing. Returned to room and ended session in w/c and in care of daughter, all needs met.   Session 2:  Pt in supine and agreeable to therapy, c/o ongoing chronic shoulder pain - did  not rate. Max assist transfer to EOB and mod assist stand pivot to w/c. Total assist w/c transport to/from therapy gym for time management. Worked on [name] muscle activation/NMR this session. Supine exercises including R knee to chest, resisted R knee extension, and orange theraband resisted hip abduction in hooklying. Attempted supine bridge, however increases pt's chronic LBP, unable to correct to minimize pain. Sit<>stands from mat w/ L hand-over-R hand to facilitate increased [name] WB. Mini-squats in standing w/o UE support, tactile and manual cues for eccentric R knee control. Min-mod assist for dynamic standing balance. NuStep 5 min @ level 1 w/ BLEs only and verbal/tactile cues for neutral [name] alignment and R quad activation. Returned to room and ended session in w/c, in care of girlfriend and all needs met. Ice applied to R shoulder for pain relief.   Therapy Documentation Precautions:  Precautions Precautions: Fall Precaution Comments: R hemiplegia; wrist in splint due to fx; shoulder pain Restrictions Weight Bearing Restrictions: No Pain: Pain Assessment Pain Scale: 0-10 Pain Score: 0-No pain  Therapy/Group: Individual Therapy  Edward Glass Edward Glass 09/07/2018, 11:14 AM

## 2018-09-07 NOTE — Progress Notes (Signed)
Speech Language Pathology Daily Session Note  Patient Details  Name: Edward Glass MRN: [id number] Date of Birth: 17-Aug-1955  Today's Date: 09/07/2018 SLP Individual Time: 4503-8882 SLP Individual Time Calculation (min): 55 min  Short Term Goals: Week 1: SLP Short Term Goal 1 (Week 1): Patient will consume currnet diet with minimal overt s/s of aspiration with supervision level verbal cues for use of compensatory strategies.  SLP Short Term Goal 2 (Week 1): Patient will consume trials of thin liquids via cup with minimal overt s/s of aspiration over 2 sessions with supervision level verbal cues to assess readiness for repeat MBS.  SLP Short Term Goal 3 (Week 1): Patient will utilize speech intelligibility strategies to maximize intelligibility to ~75% at the phrase level with Min A verbal cues.  SLP Short Term Goal 4 (Week 1): Patient will express basic wants/needs at the phrase level with Min A multimodal cues.  SLP Short Term Goal 5 (Week 1): Patient will demonstrate selective attention to functional tasks for ~45 minutes with Min A verbal cues for redirection.  SLP Short Term Goal 6 (Week 1): Patient will demonstrate functional problem solving for basic and familiar tasks with Min A verbal cues.   Skilled Therapeutic Interventions: Skilled treatment session focused on continued diagnostic treatment of language abilities. Patient demonstrated 100% accuracy with overall supervision level verbal cues with reading comprehension at both the word and phrase level with increased time needed as complexity increaesed. Patient demonstrated impaired verbal fluency during generative naming tasks but was able to perform convergent and divergent naming tasks with overall Mod A multimodal cues. Patient with intermittent verbal perseveration that patient required Mod A verbal cues to self-monitor and correct. Patient's verbal expression appears to be improving everyday, however, his overall intelligibility is  reduced due to a low vocal intensity and decreased movement of oral musculature. Patient's family present and education in regards to goals of skilled SLP intervention. All verbalized understanding and agreement. Patient left upright in wheelchair with all needs within reach and family present. Continue with current plan of care.      Pain No/Denies Pain   Therapy/Group: Individual Therapy  [name] 09/07/2018, 3:25 PM

## 2018-09-07 NOTE — Progress Notes (Signed)
Social Work Assessment and Plan  Patient Details  Name: Edward Glass MRN: [id number] Date of Birth: July 06, 1956  Today's Date: 09/07/2018  Problem List:  Patient Active Problem List   Diagnosis Date Noted  . Left basal ganglia embolic stroke ([place]) 90/24/0973  . CVA (cerebral vascular accident) ([place]) 08/31/2018  . Elevated aspartate aminotransferase level 06/16/2015  . H/O nutritional disorder 06/16/2015  . Excessive salivation 06/16/2015  . 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 Heart palpitations 06/16/2015  . 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 Screening for prostate cancer 08/21/2014  . Atypical chest pain 08/10/2013  . 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 Atypical chest pain 08/10/2013  . Elevated aspartate aminotransferase level 06/16/2015  . Excessive salivation 06/16/2015  . H/O nutritional disorder 06/16/2015  . 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 Screening for prostate cancer 08/21/2014  . 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 HERNIA REPAIR  2006  . left tricep surgery    . LOOP RECORDER INSERTION N/A 09/04/2018   Procedure: LOOP RECORDER INSERTION;  Surgeon: Evans Lance, MD;  Location: [place] [facility];  Service: Cardiovascular;  Laterality: N/A;  . TEE WITHOUT CARDIOVERSION N/A 09/04/2018   Procedure: TRANSESOPHAGEAL ECHOCARDIOGRAM (TEE);  Surgeon: Jerline Pain, MD;  Location: [facility] ENDOSCOPY;  Service: Cardiovascular;  Laterality: N/A;   Social History:  reports that he has never smoked. He has never used smokeless tobacco. He reports that he does not drink alcohol or use drugs.  Family / Support Systems Marital Status: (unknown) Patient Roles: Partner, Parent, Other (Comment)(Pt has a significant other and is a Physiological scientist.) Spouse/Significant Other: Lennie Odor -  significant other - [phone] Children: [name] and another dtr Other Supports: Didier Brandenburg - brother - [phone] Anticipated Caregiver: family ([name] to work from home per pt's brother) all family to pitch in for 24/7 A Ability/Limitations of Caregiver: Supervision/Min A Caregiver Availability: 24/7 Family Dynamics: supportive family  Social History Preferred language: English Religion: Baptist Read: Yes Write: Yes Employment Status: Employed 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   Abuse/Neglect Abuse/Neglect Assessment Can Be Completed: Yes Physical Abuse: Denies Verbal Abuse: Denies Sexual Abuse: Denies Exploitation of patient/patient's resources: Denies Self-Neglect: Denies  Emotional Status Pt's affect, behavior and adjustment status: Pt was quiet/flat during CSW visit and pt had a lot visitors.  CSW will continue to monitor this as CSW has more opportunities to be with pt with fewer people around. Recent Psychosocial Issues: none reported Psychiatric History: none reported Substance Abuse History: none reported  Patient / Family Perceptions, Expectations & Goals Pt/Family understanding of illness & functional limitations: Pt/family feel their questions have been answered and they feel they understand pt's condition/limitations. Premorbid pt/family roles/activities: Pt works as a Physiological scientist and likes to play pool. Anticipated changes in roles/activities/participation: Pt would like to resume activities as he is able. Pt/family expectations/goals: Pt is used to be independent and he nodded when CSW said we'd try to get him back to as much independence as possibe.  Community [employer]  Agencies: None Premorbid Home  Care/DME Agencies: None Transportation available at discharge: family Resource referrals recommended: Neuropsychology, Support group (specify)(Stroke Support Group)  Discharge Planning Living Arrangements: Alone Support Systems: Spouse/significant other, Children, Friends/neighbors Type of Residence: Private residence Insurance Resources: Multimedia programmer (specify)([employer]) Financial Resources: Employment Financial Screen Referred: No Money Management: Patient Does the patient have any problems obtaining your medications?: No Home Management: Pt was doing this PTA, but his dtr will assist with this at her home when pt discharges to her home. Patient/Family Preliminary Plans: Pt to d/c to Rachel's (dtr) home where she can be with pt while she works from home. Social Work Anticipated Follow Up Needs: HH/OP, Support Group Expected length of stay: about 3 weeks  Clinical Impression CSW met with pt, pt's significant other, pt's brother, and pt's friend to introduce self and role of CSW, as well as to complete assessment.  Pt tried to answer some of CSW 's questions, but let Dena (s.o.) answer at times.  Pt's voice volume/intensity is low, so it was hard to hear him at times.  Pt has good support from family and friends.  He plans to do to Rachel's (dtr) home at d/c where she can work from home yet be with pt.  CSW did tell pt/family that he will need supervision/min A likely at d/c.  Pt was a Physiological scientist, so he is enjoying therapy so far.  One of pt's clients came to see him while CSW was visiting pt and she tried to ask for pt's condition information.  CSW was careful to only share general stroke pt information and to not answer her specific questions.  CSW asked PA, Marlowe Shores, to complete pt's dtr's FMLA paperwork and he did - this was given back to [place] to give to [place].  No other current questions, needs, concerns at this point.  CSW will  continue to follow and assist as needed.  Kalya Troeger, Silvestre Mesi 09/07/2018, 11:41 PM

## 2018-09-07 NOTE — Progress Notes (Addendum)
Occupational Therapy Session Note  Patient Details  Name: Edward Glass MRN: [id number] Date of Birth: Sep 21, 1955  Today's Date: 09/07/2018 OT Individual Time:  - [phone]      Short Term Goals: Week 1:  OT Short Term Goal 1 (Week 1): Pt will transfer to/ from toilet with mod A consistently OT Short Term Goal 2 (Week 1): Pt will don UB clothing with min A  OT Short Term Goal 3 (Week 1): Pt with perform one grooming task in standing with mod A for balance OT Short Term Goal 4 (Week 1): Pt will perform bed mobility in prep for ADL with mod A (to come to EOB) OT Short Term Goal 5 (Week 1): Pt will demonstrate safe placement of right UE at rest.   Skilled Therapeutic Interventions/Progress Updates: Patient and family requested shower this visit.  Patient participated in focused areas as follows:     He required extra time for cognitive processing and for motor planning.   As well, he required verbal cues to speak in a louder voice as he is working on increasing volume.  W/c to shower seat transfer=moderate assistance x2 to patient's unaffected left side.   UB bathing and dressing=Moderate Assistance.  He required extra time and tactile cues to allow this clinician to provide hand over hand assist for holidng wash cloth in his sore right hand to cross midline to wash left side of body and in order to complete hemiplegic dressing techniques.  LB bathing = the main focus this session: overall required maximal asssistance focused on periarea, buttocks and upper legs as this patient fatigued He required overall maximal assistance to wash buttocks with this clinician blocking left knee to increase balance and for safety to help prevent patient from losing his balance as he went sit to squat for this clinician to help assist washing his buttocks..   Patient was able to maintain right upper extremity weight bearing with max assist while he completed right lateral lean to wash left side of buttock while  seated on shower chair  LB dressing= main focus was sit to stand for pulling up pants and underwwear as patient fatigued after the shower and other tasks required.   Required moderate assistance for sit to stand and then required total assist for pulling up his underwear and pants at sink (from w/c).   While standing and to ease pulling up pants, patient practiced thoracic extension but had great difficulty due to decreased trunk control and stability.  W/c to bed transfer going to patient's affected hemiparesis right side=max assist Bed mobility = total assist  Patient left in the supportive care of his girlfriend and with bed alarm engaged     Therapy Documentation Precautions:  Precautions Precautions: Fall Precaution Comments: R hemiplegia; wrist in splint due to fx; shoulder pain Restrictions Weight Bearing Restrictions: No  Pain:denied  Therapy/Group: Individual Therapy  Alfredia Ferguson [facility] 09/07/2018, 8:17 PM

## 2018-09-07 NOTE — Progress Notes (Signed)
[place] PHYSICAL MEDICINE & REHABILITATION PROGRESS NOTE   Subjective/Complaints:  No issues overnite  ROS- No CP, SOB, N/V/D  Objective:   No results found. Recent Labs    09/04/18 1944 09/06/18 0606  WBC 9.9 9.3  HGB 16.2 14.8  HCT 49.5 45.7  PLT 349 335   Recent Labs    09/04/18 1944 09/06/18 0606  NA  --  139  K  --  4.1  CL  --  102  CO2  --  29  GLUCOSE  --  119*  BUN  --  14  CREATININE 0.91 0.89  CALCIUM  --  8.6*    Intake/Output Summary (Last 24 hours) at 09/07/2018 0818 Last data filed at 09/07/2018 0815 Gross per 24 hour  Intake 700 ml  Output 3 ml  Net 697 ml     Physical Exam: Vital Signs Blood pressure (!) 115/59, pulse 66, temperature 98 F (36.7 C), resp. rate 18, height 5\' 5"  (1.651 m), weight 74.7 kg, SpO2 95 %.   General: No acute distress Mood and affect are appropriate Heart: Regular rate and rhythm no rubs murmurs or extra sounds Lungs: Clear to auscultation, breathing unlabored, no rales or wheezes Abdomen: Positive bowel sounds, soft nontender to palpation, nondistended Extremities: No clubbing, cyanosis, or edema Skin: No evidence of breakdown, no evidence of rash Neurologic: Cranial nerves II through XII intact, motor strength is 4/5 in Left deltoid, bicep, tricep, grip, hip flexor, knee extensors, ankle dorsiflexor and plantar flexor Trace R finger flexion and biceps 2- hip adduction, knee ext, 0/5 in RIght ankle Sensory exam unable to assess secondary to aphasia Cerebellar exam unable to perform on RIght due to weakness Musculoskeletal: Full range of motion in all 4 extremities. No joint swelling   Assessment/Plan: 1. Functional deficits secondary to RIght hemiparesis and aphasia which require 3+ hours per day of interdisciplinary therapy in a comprehensive inpatient rehab setting.  Physiatrist is providing close team supervision and 24 hour management of active medical problems listed below.  Physiatrist and rehab  team continue to assess barriers to discharge/monitor patient progress toward functional and medical goals  Care Tool:  Bathing    Body parts bathed by patient: Right arm, Chest, Abdomen, Front perineal area, Right upper leg, Left upper leg, Face   Body parts bathed by helper: Right lower leg, Left lower leg, Buttocks, Left arm     Bathing assist Assist Level: Moderate Assistance - Patient 50 - 74%     Upper Body Dressing/Undressing Upper body dressing   What is the patient wearing?: Pull over shirt    Upper body assist Assist Level: Maximal Assistance - Patient 25 - 49%    Lower Body Dressing/Undressing Lower body dressing      What is the patient wearing?: Underwear/pull up, Pants     Lower body assist Assist for lower body dressing: Maximal Assistance - Patient 25 - 49%     Toileting Toileting    Toileting assist Assist for toileting: Maximal Assistance - Patient 25 - 49%     Transfers Chair/bed transfer  Transfers assist     Chair/bed transfer assist level: Maximal Assistance - Patient 25 - 49%     Locomotion Ambulation   Ambulation assist   Ambulation activity did not occur: Safety/medical concerns      Max distance: 25 ft   Walk 10 feet activity   Assist     Assist level: 2 helpers Assistive device: Other (comment)(rail)   Walk 50 feet  activity   Assist Walk 50 feet with 2 turns activity did not occur: Safety/medical concerns         Walk 150 feet activity   Assist Walk 150 feet activity did not occur: Safety/medical concerns         Walk 10 feet on uneven surface  activity   Assist Walk 10 feet on uneven surfaces activity did not occur: Safety/medical concerns         Wheelchair     Assist Will patient use wheelchair at discharge?: Yes Type of Wheelchair: Manual    Wheelchair assist level: Minimal Assistance - Patient > 75% Max wheelchair distance: 25 ft    Wheelchair 50 feet with 2 turns  activity    Assist    Wheelchair 50 feet with 2 turns activity did not occur: Safety/medical concerns       Wheelchair 150 feet activity     Assist Wheelchair 150 feet activity did not occur: Safety/medical concerns         Medical Problem List and Plan: 1.Right hemiparesis/aphasiasecondary to left corona radiata basal ganglia internal capsule infarction.Status post loop recorder 09/04/2018 Weakness worsened on right side since initial eval on 2/6 -CIR PT, OT  2. DVT Prophylaxis/Anticoagulation: Venous Dopplers lower extremity negative.Subcutaneous Lovenox for DVT prophylaxis 3. Pain Management:Tylenol as needed. No pain at present 4. Mood:Prozac 20 mg daily. Provide ego support as able 5. Neuropsych: This patientiscapable of making decisions on hisown behalf. 6. Skin/Wound Care:Routine skin checks 7. Fluids/Electrolytes/Nutrition:Routine in and out's with follow-up chemistries 8. Dysphagia. Dysphagia #2 nectar thick liquids- BMET normal-advance diet as appropriate 9. Hyperlipidemia. Lipitor 10. Permissive hypertension. Patient on no antihypertensive medications on admission 11. BPH. Uroxatral 10 mg 3 times a day once per day on Monday Wednesday Friday. Check PVRs 12. Possible right wrist fracture/Triquetrum.. Conservative care. Follow-up orthopedic service Dr. Stann Mainland. Weightbearing as tolerated with splint in place, will order Xray in ~2wks   13.  Constipation unresponsive to supp or oral laxative, ordered Fleets but had spontaneous BM LOS: 2 days A FACE TO FACE EVALUATION WAS PERFORMED  [name] 09/07/2018, 8:18 AM

## 2018-09-08 ENCOUNTER — Inpatient Hospital Stay (HOSPITAL_COMMUNITY): Payer: BLUE CROSS/BLUE SHIELD

## 2018-09-08 ENCOUNTER — Inpatient Hospital Stay (HOSPITAL_COMMUNITY): Payer: BLUE CROSS/BLUE SHIELD | Admitting: Speech Pathology

## 2018-09-08 MED ORDER — ASPIRIN 325 MG PO TABS
325.0000 mg | ORAL_TABLET | Freq: Every day | ORAL | Status: DC
  Administered 2018-09-09 – 2018-09-29 (×21): 325 mg via ORAL
  Filled 2018-09-08 (×22): qty 1

## 2018-09-08 NOTE — Progress Notes (Signed)
Physical Therapy Session Note  Patient Details  Name: Edward Glass MRN: [id number] Date of Birth: 01-29-1956  Today's Date: 09/08/2018 PT Individual Time: 2505-3976 PT Individual Time Calculation (min): 45 min   Short Term Goals: Week 1:  PT Short Term Goal 1 (Week 1): Pt will perform bed<>chair transfer with mod assist PT Short Term Goal 2 (Week 1): Pt sill perform sit<>stand with min assist PT Short Term Goal 3 (Week 1): Pt will propel w/c x 50 ft with supervision PT Short Term Goal 4 (Week 1): Pt will ambulate x 30 ft with LRAD and mod assist  Skilled Therapeutic Interventions/Progress Updates:    Patient in supine in room with visitors present.  Assisted to roll to R with mod A, legs off bed and lifting trunk to sit max A.  Patient transferred via squat pivot to L to w/c mod A.  Propelled w/c min A mod cues for technique and R awareness x 100'.  Patient transferred to stand mod A and performed weight shifts noted difficulty initiating swing on R so applied shoe cover to toe.  Gait x 15' mod A for weight shift, cue for stance stabilization on R and assist for preventing knee recurvatum.  Standing at table for step taps to discs with L for R stance with assist for knee and hip control.  Patient transferred to mat mod A and sit to supine mod A to perform single leg bridge on R after mod cues and demon on L.  Attempted hamstring sets, difficulty activating without co-contraction.  L sidelying with powder board for R LE hip flexion, then extension with knee extension and hip depression, then knee flexion prior to hip flexion to simulate gait cycle progressing from PROM to [facility] and restriction of hip flexion to increase hamstring activation.  Patient side to sit mod A and transferred to w/c stand step to R with mod to max A.  Patient assisted in w/c to room and left with alarm belt, girlfriend in room and ice to R shoulder.   Therapy Documentation Precautions:  Precautions Precautions:  Fall Precaution Comments: R hemiplegia; wrist in splint due to fx; shoulder pain Restrictions Weight Bearing Restrictions: No Pain: Pain Assessment Faces Pain Scale: Hurts even more Pain Type: Acute pain Pain Location: Shoulder Pain Orientation: Right Pain Descriptors / Indicators: Grimacing;Guarding Pain Onset: With Activity Pain Intervention(s): Rest;Cold applied    Therapy/Group: Individual Therapy  Reginia Naas  [place], [place] 09/08/2018  09/08/2018, 5:30 PM

## 2018-09-08 NOTE — Progress Notes (Signed)
Occupational Therapy Session Note  Patient Details  Name: Edward Glass MRN: [id number] Date of Birth: 30-Aug-1955  Today's Date: 09/08/2018 OT Individual Time: 1100-1130 OT Individual Time Calculation (min): 30 min    Short Term Goals: Week 1:  OT Short Term Goal 1 (Week 1): Pt will transfer to/ from toilet with mod A consistently OT Short Term Goal 2 (Week 1): Pt will don UB clothing with min A  OT Short Term Goal 3 (Week 1): Pt with perform one grooming task in standing with mod A for balance OT Short Term Goal 4 (Week 1): Pt will perform bed mobility in prep for ADL with mod A (to come to EOB) OT Short Term Goal 5 (Week 1): Pt will demonstrate safe placement of right UE at rest.   Skilled Therapeutic Interventions/Progress Updates:    Pt resting in bed upon arrival and agreeable to therapy.  Pt requires mod A for supine>sit EOB with HOB elevated.  Pt performed squat pivot transfer to w/c with mod A and max verbal cues for sequencing.  Pt transitioned to gym and transferred to mat.  RUE NMR per below with improved shoulder shrugs abut limited scapula retraction/protraction.  Pectoralis stretching provided to reduce tone.  Pt returned to room and requested to use toilet.  Mod A for transfers and tot A for toileting.  Pt remained in w/c with belt alarm activated and all needs within reach.  SO present.   Therapy Documentation Precautions:  Precautions Precautions: Fall Precaution Comments: R hemiplegia; wrist in splint due to fx; shoulder pain Restrictions Weight Bearing Restrictions: No  Pain: Pt c/o increased R shoulder pain with movement; Pt's SO reports this is premorbid and pt was icing X 3 day   Other Treatments: Treatments Neuromuscular Facilitation: Right;Upper Extremity;Activity to increase motor control;Activity to increase timing and sequencing;Activity to increase grading;Activity to increase sustained activation;Activity to increase coordination;Activity to increase  anterior-posterior weight shifting;Activity to increase lateral weight shifting   Therapy/Group: Individual Therapy  [name] 09/08/2018, 12:07 PM

## 2018-09-08 NOTE — Progress Notes (Signed)
[facility] PHYSICAL MEDICINE & REHABILITATION PROGRESS NOTE   Subjective/Complaints:  No BM yesterday, hx of chronic R shoulder pain (icing it forever per girlfriend)  ROS- No CP, SOB, N/V/D  Objective:   No results found. Recent Labs    09/06/18 0606  WBC 9.3  HGB 14.8  HCT 45.7  PLT 335   Recent Labs    09/06/18 0606  NA 139  K 4.1  CL 102  CO2 29  GLUCOSE 119*  BUN 14  CREATININE 0.89  CALCIUM 8.6*    Intake/Output Summary (Last 24 hours) at 09/08/2018 0809 Last data filed at 09/08/2018 0050 Gross per 24 hour  Intake 240 ml  Output 250 ml  Net -10 ml     Physical Exam: Vital Signs Blood pressure (!) 142/73, pulse 60, temperature 98 F (36.7 C), temperature source Oral, resp. rate (!) 21, height 5\' 5"  (1.651 m), weight 74.7 kg, SpO2 96 %.   General: No acute distress Mood and affect are appropriate Heart: Regular rate and rhythm no rubs murmurs or extra sounds Lungs: Clear to auscultation, breathing unlabored, no rales or wheezes Abdomen: Positive bowel sounds, soft nontender to palpation, nondistended Extremities: No clubbing, cyanosis, or edema Skin: No evidence of breakdown, no evidence of rash Neurologic: Cranial nerves II through XII intact, motor strength is 4/5 in Left deltoid, bicep, tricep, grip, hip flexor, knee extensors, ankle dorsiflexor and plantar flexor Trace R finger flexion and biceps 2- hip adduction, knee ext, 0/5 in RIght ankle Sensory exam unable to assess secondary to aphasia Cerebellar exam unable to perform on RIght due to weakness Musculoskeletal: Full range of motion in all 4 extremities. No joint swelling   Assessment/Plan: 1. Functional deficits secondary to RIght hemiparesis and aphasia which require 3+ hours per day of interdisciplinary therapy in a comprehensive inpatient rehab setting.  Physiatrist is providing close team supervision and 24 hour management of active medical problems listed below.  Physiatrist and  rehab team continue to assess barriers to discharge/monitor patient progress toward functional and medical goals  Care Tool:  Bathing    Body parts bathed by patient: Right arm, Chest, Abdomen, Front perineal area, Right upper leg, Left upper leg, Face   Body parts bathed by helper: Left arm, Buttocks, Right lower leg, Left lower leg     Bathing assist Assist Level: Maximal Assistance - Patient 24 - 49%     Upper Body Dressing/Undressing Upper body dressing   What is the patient wearing?: Pull over shirt    Upper body assist Assist Level: Maximal Assistance - Patient 25 - 49%    Lower Body Dressing/Undressing Lower body dressing      What is the patient wearing?: Underwear/pull up, Pants     Lower body assist Assist for lower body dressing: Total Assistance - Patient < 25%     Toileting Toileting    Toileting assist Assist for toileting: Maximal Assistance - Patient 25 - 49%     Transfers Chair/bed transfer  Transfers assist     Chair/bed transfer assist level: Maximal Assistance - Patient 25 - 49%     Locomotion Ambulation   Ambulation assist   Ambulation activity did not occur: Safety/medical concerns  Assist level: Moderate Assistance - Patient 50 - 74% Assistive device: Walker-rolling Max distance: 50'   Walk 10 feet activity   Assist     Assist level: Moderate Assistance - Patient - 50 - 74% Assistive device: Walker-rolling   Walk 50 feet activity   Assist  Walk 50 feet with 2 turns activity did not occur: Safety/medical concerns  Assist level: Moderate Assistance - Patient - 50 - 74% Assistive device: Walker-rolling    Walk 150 feet activity   Assist Walk 150 feet activity did not occur: Safety/medical concerns         Walk 10 feet on uneven surface  activity   Assist Walk 10 feet on uneven surfaces activity did not occur: Safety/medical concerns         Wheelchair     Assist Will patient use wheelchair at  discharge?: Yes Type of Wheelchair: Manual    Wheelchair assist level: Minimal Assistance - Patient > 75% Max wheelchair distance: 25 ft    Wheelchair 50 feet with 2 turns activity    Assist    Wheelchair 50 feet with 2 turns activity did not occur: Safety/medical concerns       Wheelchair 150 feet activity     Assist Wheelchair 150 feet activity did not occur: Safety/medical concerns         Medical Problem List and Plan: 1.Right hemiparesis/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 DVT Prophylaxis/Anticoagulation: Venous Dopplers lower extremity negative.Subcutaneous Lovenox for DVT prophylaxis 3. Pain Management:Tylenol as needed. No pain at present 4. Mood:Prozac 20 mg daily. Provide ego support as able 5. Neuropsych: This patientiscapable of making decisions on hisown behalf. 6. Skin/Wound Care:Routine skin checks 7. Fluids/Electrolytes/Nutrition:Routine in and out's with follow-up chemistries I 270ml on 2/13- enc fluid 8. Dysphagia. Dysphagia #2 nectar thick liquids- BMET normal-advance diet as appropriate 9. Hyperlipidemia. Lipitor 10. Permissive hypertension. Patient on no antihypertensive medications on admission Vitals:   09/07/18 1946 09/08/18 0535  BP: 127/73 (!) 142/73  Pulse: 61 60  Resp: 18 (!) 21  Temp: 98.1 F (36.7 C) 98 F (36.7 C)  SpO2: 96% 96%   11. BPH. Uroxatral 10 mg 3 times a day once per day on Monday Wednesday Friday. Check PVRs 12. Possible right wrist fracture/Triquetrum.. Conservative care. Follow-up orthopedic service Dr. Stann Mainland. Weightbearing as tolerated with splint in place, will order Xray in ~2wks   13.  Constipation unresponsive to supp or oral laxative, ordered Fleets last BM 2/12 LOS: 3 days A FACE TO FACE EVALUATION WAS  PERFORMED  [name] 09/08/2018, 8:09 AM

## 2018-09-08 NOTE — Progress Notes (Signed)
Occupational Therapy Session Note  Patient Details  Name: Edward Glass MRN: [id number] Date of Birth: 01-13-1956  Today's Date: 09/08/2018 OT Individual Time: 0800-0930 OT Individual Time Calculation (min): 90 min    Short Term Goals: Week 1:  OT Short Term Goal 1 (Week 1): Pt will transfer to/ from toilet with mod A consistently OT Short Term Goal 2 (Week 1): Pt will don UB clothing with min A  OT Short Term Goal 3 (Week 1): Pt with perform one grooming task in standing with mod A for balance OT Short Term Goal 4 (Week 1): Pt will perform bed mobility in prep for ADL with mod A (to come to EOB) OT Short Term Goal 5 (Week 1): Pt will demonstrate safe placement of right UE at rest.   Skilled Therapeutic Interventions/Progress Updates:    OT intervention with focus on bed mobility, functional transfers, BADL retraining, sit<>stand, standing balance, task initiation, sequencing, activity tolerance, and safety awareness to increase independence with BADLs. See below for assist levels.  Pt requires mod verbal cues for sequencing with sit<>stand.  Pt educated in hemi dressing techniques.  Pt does not initiate use of RUE in functional tasks.  Pt requires min A for pulling pants over hips with CGA for standing balance. Pt remained in w/c with all needs within reach, belt alarm activated, and SO present.   Therapy Documentation Precautions:  Precautions Precautions: Fall Precaution Comments: R hemiplegia; wrist in splint due to fx; shoulder pain Restrictions Weight Bearing Restrictions: No  Pain: Pain Assessment Pain Scale: 0-10 Pain Score: 0-No pain Faces Pain Scale: Hurts a little bit Pain Type: Acute pain Pain Location: Shoulder Pain Orientation: Right;Upper Pain Descriptors / Indicators: Discomfort Pain Frequency: Intermittent Pain Onset: With Activity Patients Stated Pain Goal: 0 Pain Intervention(s): Medication (See eMAR) ADL: ADL Grooming: Minimal assistance Where  Assessed-Grooming: Sitting at sink Upper Body Bathing: Minimal assistance Where Assessed-Upper Body Bathing: Shower Lower Body Bathing: Moderate assistance Where Assessed-Lower Body Bathing: Shower Upper Body Dressing: Maximal assistance Where Assessed-Upper Body Dressing: Wheelchair Lower Body Dressing: Maximal assistance Where Assessed-Lower Body Dressing: Teaching laboratory technician: Maximal assistance Social research officer, government Method: Radiographer, therapeutic: Radio broadcast assistant   Therapy/Group: Individual Therapy  [name] 09/08/2018, 11:02 AM

## 2018-09-08 NOTE — Progress Notes (Signed)
Speech Language Pathology Daily Session Note  Patient Details  Name: Edward Glass MRN: [id number] Date of Birth: 05/09/1956  Today's Date: 09/08/2018 SLP Individual Time: 0930-1015 SLP Individual Time Calculation (min): 45 min  Short Term Goals: Week 1: SLP Short Term Goal 1 (Week 1): Patient will consume currnet diet with minimal overt s/s of aspiration with supervision level verbal cues for use of compensatory strategies.  SLP Short Term Goal 2 (Week 1): Patient will consume trials of thin liquids via cup with minimal overt s/s of aspiration over 2 sessions with supervision level verbal cues to assess readiness for repeat MBS.  SLP Short Term Goal 3 (Week 1): Patient will utilize speech intelligibility strategies to maximize intelligibility to ~75% at the phrase level with Min A verbal cues.  SLP Short Term Goal 4 (Week 1): Patient will express basic wants/needs at the phrase level with Min A multimodal cues.  SLP Short Term Goal 5 (Week 1): Patient will demonstrate selective attention to functional tasks for ~45 minutes with Min A verbal cues for redirection.  SLP Short Term Goal 6 (Week 1): Patient will demonstrate functional problem solving for basic and familiar tasks with Min A verbal cues.   Skilled Therapeutic Interventions: Skilled treatment session focused on dysphagia and cognitive goals. SLP facilitated session by providing set-up assist with the suction toothbrush. Patient consumed trials of thin liquids without overt s/s of aspiration and supervision level verbal cues for use of small sips. Recommend patient continue the water protocol with trials via cup. SLP also facilitated session by providing Mod A verbal and visual cues for functional problem solving during a basic money management task that focused on counting money, generating specific amounts of change and basic mathematical problems. Function impacted by language impairment. Patient transferred back to bed at end of  session. Patient left supine in bed with alarm on, all needs within reach and friend present. Continue with current plan of care.      Pain No/Denies Pain  Therapy/Group: Individual Therapy  Ayushi Pla 09/08/2018, 11:17 AM

## 2018-09-08 NOTE — IPOC Note (Signed)
Overall Plan of Care [facility]) Patient Details Name: Edward Glass MRN: [id number] DOB: 1955/08/01  Admitting Diagnosis: <principal problem not specified>  Hospital Problems: Active Problems:   Left basal ganglia embolic stroke [facility])     Functional Problem List: Nursing Medication Management, Nutrition, Pain, Endurance, Motor  PT Balance, Edema, Endurance, Motor, Pain, Perception, Safety, Sensory  OT Balance, Motor, Sensory, Cognition, Pain, Vision, Edema, Perception, Endurance, Safety  SLP Cognition, Nutrition, Linguistic  TR         Basic ADL's: OT Grooming, Bathing, Dressing, Toileting     Advanced  ADL's: OT       Transfers: PT Bed Mobility, Bed to Chair, Car, Furniture, Futures trader, Metallurgist: PT Ambulation, Emergency planning/management officer, Stairs     Additional Impairments: OT Fuctional Use of Upper Extremity  SLP Swallowing, Communication expression, comprehension Social Interaction, Problem Solving, Attention, Awareness  TR      Anticipated Outcomes Item Anticipated Outcome  Self Feeding n/a  Swallowing  Mod I   Basic self-care  supervision to contact guard  Toileting  contact guard   Bathroom Transfers contact guard  Bowel/Bladder  Pt will manage bowel and bladder with mod  I assist at discharge   Transfers  CGA  Locomotion  min assist  Communication  Supervision  Cognition  Supervision   Pain  Pt will manage pain at 3 or less on a scale of 0-10   Safety/Judgment  Pt will remain free of falls with injury while in rehab with supervision assist    Therapy Plan: PT Intensity: Minimum of 1-2 x/day ,45 to 90 minutes PT Frequency: 5 out of 7 days PT Duration Estimated Length of Stay: 21-25 days OT Intensity: Minimum of 1-2 x/day, 45 to 90 minutes OT Frequency: 5 out of 7 days OT Duration/Estimated Length of Stay: 19-25 days SLP Intensity: Minumum of 1-2 x/day, 30 to 90 minutes SLP Frequency: 3 to 5 out of 7 days SLP  Duration/Estimated Length of Stay: 19-25 days     Team Interventions: Nursing Interventions Patient/Family Education, Medication Management, Pain Management, Dysphagia/Aspiration Precaution Training, Discharge Planning, Cognitive Remediation/Compensation  PT interventions Ambulation/gait training, DME/adaptive equipment instruction, Training and development officer, Cognitive remediation/compensation, Community reintegration, Discharge planning, Neuromuscular re-education, Splinting/orthotics, UE/LE Coordination activities, UE/LE Strength taining/ROM, Skin care/wound management, Functional mobility training, Functional electrical stimulation, Psychosocial support, Therapeutic Exercise, Wheelchair propulsion/positioning, Therapeutic Activities, Pain management, Disease management/prevention, Patient/family education, IT trainer, Visual/perceptual remediation/compensation  OT Interventions Training and development officer, Community reintegration, Disease mangement/prevention, Technical sales engineer stimulation, Neuromuscular re-education, Patient/family education, Self Care/advanced ADL retraining, Splinting/orthotics, Therapeutic Exercise, UE/LE Coordination activities, Wheelchair propulsion/positioning, Cognitive remediation/compensation, Discharge planning, DME/adaptive equipment instruction, Functional mobility training, Pain management, Psychosocial support, Skin care/wound managment, Therapeutic Activities, UE/LE Strength taining/ROM, Visual/perceptual remediation/compensation  SLP Interventions Cognitive remediation/compensation, Environmental controls, Internal/external aids, Speech/Language facilitation, Therapeutic Activities, Patient/family education, Functional tasks, Dysphagia/aspiration precaution training, Cueing hierarchy  TR Interventions    SW/CM Interventions Discharge Planning, Psychosocial Support, Patient/Family Education   Barriers to Discharge MD  Medical stability  Nursing      PT       OT      SLP      SW       Team Discharge Planning: Destination: PT-Home ,OT- Home , SLP-Home Projected Follow-up: PT-Home health PT, OT-  Outpatient OT, 24 hour supervision/assistance, SLP-24 hour supervision/assistance, Home Health SLP, Outpatient SLP Projected Equipment Needs: PT-To be determined, OT- To be determined, SLP-To be determined Equipment Details: PT- , OT-  Patient/family involved in  discharge planning: PT- Patient,  OT-Patient, Family member/caregiver, SLP-Patient, Family member/caregiver  MD ELOS: 18-24d Medical Rehab Prognosis:  Good Assessment:  63 year old right-handed male with history of hypogonadism, BPH. Per chart review lives alone. 2 level home to steps two entry. Patient works as a Radio producer.He does have family in the area with good supportand plans to stay with his daughter on discharge.. Presented to 08/30/2018 with right-sided weakness/falland slurred speech. Cranial CT scan showed age indeterminate small vessel ischemia in the left basal ganglia new since 2010. CT perfusion scan as well as CTA head and neck left M1 near occlusion. MRI confirmed multi small foci of acute early subacute infarction present in the left MCA distribution concentrated in the left basal ganglia. Patient did not receive TPA. Echocardiogram with ejection fraction of 60% and normal systolic function.Noted on 09/01/2018 patient with increase weakness of the right side and dysarthria with follow-up cranial CT scan showing more confluent acute infarct in the left basal ganglia and corona radiata when compared to diffusion imaging 2 days prior. No new distribution infarction or hemorrhagic conversion.Presently on aspirin and Plavix for CVA prophylaxis. Subcutaneous Lovenox for DVT prophylaxis. Modified barium swallow completed presently on a dysphagia #2 nectar thick liquid diet.TEE 05/15/2019 showed ejection fraction of 55%as well as a small PFO/bubble crossover noted during  Valsalva. Loop recorder was also placed.Patient with x-rays of right hand after recent fall related to CVA showed possible fracture of the triquetrum with conservative care per orthopedic services Dr. Stann Mainland and weightbearing as tolerated and placed in a splint.Therapy evaluations completed with recommendations of physical medicine rehabilitation consult. Patient was admitted for a comprehensive rehabilitation program.   See Team Conference Notes for weekly updates to the plan of care  Now requiring 24/7 Rehab RN,MD, as well as CIR level PT, OT and SLP.  Treatment team will focus on ADLs and mobility with goals set at CGA/Sup

## 2018-09-09 ENCOUNTER — Inpatient Hospital Stay (HOSPITAL_COMMUNITY): Payer: BLUE CROSS/BLUE SHIELD

## 2018-09-09 ENCOUNTER — Inpatient Hospital Stay (HOSPITAL_COMMUNITY): Payer: BLUE CROSS/BLUE SHIELD | Admitting: Occupational Therapy

## 2018-09-09 ENCOUNTER — Inpatient Hospital Stay (HOSPITAL_COMMUNITY): Payer: BLUE CROSS/BLUE SHIELD | Admitting: Physical Therapy

## 2018-09-09 DIAGNOSIS — K5901 Slow transit constipation: Secondary | ICD-10-CM

## 2018-09-09 MED ORDER — SORBITOL 70 % SOLN
30.0000 mL | Status: AC
  Administered 2018-09-09: 30 mL via ORAL
  Filled 2018-09-09: qty 30

## 2018-09-09 MED ORDER — SORBITOL 70 % SOLN: 60.0000 mL | Status: DC

## 2018-09-09 NOTE — Progress Notes (Signed)
Occupational Therapy Session Note  Patient Details  Name: Edward Glass MRN: [id number] Date of Birth: [birth year]  Today's Date: 09/09/2018 OT Individual Time: 9574-7340 OT Individual Time Calculation (min): 62 min    Short Term Goals: Week 1:  OT Short Term Goal 1 (Week 1): Pt will transfer to/ from toilet with mod A consistently OT Short Term Goal 2 (Week 1): Pt will don UB clothing with min A  OT Short Term Goal 3 (Week 1): Pt with perform one grooming task in standing with mod A for balance OT Short Term Goal 4 (Week 1): Pt will perform bed mobility in prep for ADL with mod A (to come to EOB) OT Short Term Goal 5 (Week 1): Pt will demonstrate safe placement of right UE at rest.      Skilled Therapeutic Interventions/Progress Updates:    Pt received in bed with his dtr in the room. Pt agreeable to an earlier session time.  Pt's dtr discussed his shoulder pain and how limited ROM is. She said he has had chronic shoulder pain for quite some time and it was hard for him to reach his arm over his head.    From supine in flat bed, worked on scapular PROM with no pain, attempted to integrate ext rot but too painful for pt.  Was able to place sh in 60 degrees of flexion to work on hand drop activity to elicit triceps. Pt was able to elicit a tonal response to activate tricep to prevent hand from falling toward his head and was able to do this 8x in a row. Pt worked on bed mobility to roll to R with B knees bent and using L arm to push up to siting from sidelying.  Pt completed squat pivot to his R to w/c with mod A.   Pt taken to gym with dtr to work on various exercises to facilitate RUE: towel glides on table, active scapular retraction, hand over hand grasping (limited by R wrist brace).  Applied kinesio tape to R shoulder to provide support.  Educated pt and dtr on purpose of tape.  Pt requested to go back to bed.  Worked on squat pivot back to bed, and layed down with min A.bed alarm set  and all needs met. Pt in room with family.    Therapy Documentation Precautions:  Precautions Precautions: Fall Precaution Comments: R hemiplegia; wrist in splint due to fx; shoulder pain Restrictions Weight Bearing Restrictions: No   Pain: Pain Assessment Faces Pain Scale: Hurts whole lot Pain Type: Acute pain Pain Location: Shoulder Pain Orientation: Right Pain Descriptors / Indicators: Grimacing;Guarding Pain Onset: With Activity Pain Intervention(s): Massage(kinesiotape)    Therapy/Group: Individual Therapy  Bassett 09/09/2018, 2:21 PM

## 2018-09-09 NOTE — Progress Notes (Signed)
Speech Language Pathology Daily Session Note  Patient Details  Name: Edward Glass MRN: [id number] Date of Birth: June 20, 1956  Today's Date: 09/09/2018 SLP Individual Time: 1100-1200 SLP Individual Time Calculation (min): 60 min  Short Term Goals: Week 1: SLP Short Term Goal 1 (Week 1): Patient will consume currnet diet with minimal overt s/s of aspiration with supervision level verbal cues for use of compensatory strategies.  SLP Short Term Goal 2 (Week 1): Patient will consume trials of thin liquids via cup with minimal overt s/s of aspiration over 2 sessions with supervision level verbal cues to assess readiness for repeat MBS.  SLP Short Term Goal 3 (Week 1): Patient will utilize speech intelligibility strategies to maximize intelligibility to ~75% at the phrase level with Min A verbal cues.  SLP Short Term Goal 4 (Week 1): Patient will express basic wants/needs at the phrase level with Min A multimodal cues.  SLP Short Term Goal 5 (Week 1): Patient will demonstrate selective attention to functional tasks for ~45 minutes with Min A verbal cues for redirection.  SLP Short Term Goal 6 (Week 1): Patient will demonstrate functional problem solving for basic and familiar tasks with Min A verbal cues.   Skilled Therapeutic Interventions:Skilled ST services focused on education, swallow and cognitive skills. SLP facilitated oral care piror to thin via cup trials, pt consumed 4oz with no overt s/s aspiration. SLP facilitated basic problem solving skills utilizing 3-4 step picture sequence card task, pt demonstrated mod I sequencing 3 step cards and required supervision A verbal cues sequencing 4 step cards. SLP facilitated speech intelligibility at phrase level during picture description task, pt required mod A verbal cues for 60% intelligibility and mod A fade to min A verbal cues to limit perseveration of phrase. Pt demonstrated ability to express needs clearly, requesting to use the restroom, NT and  nurse assisted. SLP provided education to pt's daughter, Jonelle Sidle, pertaining to swallow strategies, following return demonstration of cuing, signed off to provided supervision during meals. Pt was left in room with call bell within reach, daughters present and bed alarm set. SLP reccomends to continue skilled services.     Pain Pain Assessment Pain Score: 0-No pain Faces Pain Scale: Hurts whole lot Pain Type: Acute pain Pain Location: Shoulder Pain Orientation: Right Pain Descriptors / Indicators: Grimacing;Guarding Pain Onset: With Activity Pain Intervention(s): Massage(kinesiotape)  Therapy/Group: Individual Therapy  [name]  [facility] 09/09/2018, 3:25 PM

## 2018-09-09 NOTE — Progress Notes (Signed)
Physical Therapy Session Note  Patient Details  Name: Edward Glass MRN: [id number] Date of Birth: 1955-11-28  Today's Date: 09/09/2018 PT Individual Time: 0800-0856 PT Individual Time Calculation (min): 56 min   Short Term Goals: Week 1:  PT Short Term Goal 1 (Week 1): Pt will perform bed<>chair transfer with mod assist PT Short Term Goal 2 (Week 1): Pt sill perform sit<>stand with min assist PT Short Term Goal 3 (Week 1): Pt will propel w/c x 50 ft with supervision PT Short Term Goal 4 (Week 1): Pt will ambulate x 30 ft with LRAD and mod assist  Skilled Therapeutic Interventions/Progress Updates:   Pt in supine and agreeable to therapy, no c/o pain. Min assist transfer to EOB, mod assist transfer to chair. Total assist w/c transport to/from therapy gym. Session focused on [name] NMR and standing postural control. Sit<>stands w/o RW support w/ min assist and min assist to maintain standing balance. Performed LUE reaching tasks w/ clothespins and horseshoes emphasizing trunk rotation, lateral weight shifting, forward lean, and squatting to reach. Max assist to correct 2-3 LOBs. Stood 30-60 sec at a time x10+ reps. Tactile and verbal cues throughout for R quad activation w/ prolonged standing and for weight shifting at pelvis. Intermittent seated rest breaks 2/2 fatigue. During rest breaks, instructed pt on muscle activation exercises he can perform outside of therapy including LAQs and knee marches. Trace muscle activation for hip flexion, pt able to see a slight lift. Provided w/ written handout of both exercises and added supine heel slides that pt performed on Thursday, pt able to recall exercise. Cautioned w/ straining and valsalva. Kinetron @ 30 cm/sec, 60 sec on and 30 sec off x3 reps. Returned to room and ended session in w/c and in care of daughter, all needs met.   Therapy Documentation Precautions:  Precautions Precautions: Fall Precaution Comments: R hemiplegia; wrist in splint due  to fx; shoulder pain Restrictions Weight Bearing Restrictions: No   Therapy/Group: Individual Therapy  Edward Glass [name] 09/09/2018, 8:59 AM

## 2018-09-09 NOTE — Progress Notes (Signed)
[place] PHYSICAL MEDICINE & REHABILITATION PROGRESS NOTE   Subjective/Complaints:  Pt appears comfortable. Family at bedside. No new issues. Moving right arm a bit more today  ROS: Patient denies fever, rash, sore throat, blurred vision, nausea, vomiting, diarrhea, cough, shortness of breath or chest pain, joint or back pain, headache, or mood change.    Objective:   No results found. No results for input(s): WBC, HGB, HCT, PLT in the last 72 hours. No results for input(s): NA, K, CL, CO2, GLUCOSE, BUN, CREATININE, CALCIUM in the last 72 hours.  Intake/Output Summary (Last 24 hours) at 09/09/2018 1008 Last data filed at 09/09/2018 0845 Gross per 24 hour  Intake 720 ml  Output 250 ml  Net 470 ml     Physical Exam: Vital Signs Blood pressure 136/71, pulse (!) 52, temperature 98.5 F (36.9 C), temperature source Oral, resp. rate 20, height 5\' 5"  (1.651 m), weight 74.7 kg, SpO2 95 %.   Constitutional: No distress . Vital signs reviewed. HEENT: EOMI, oral membranes moist Neck: supple Cardiovascular: RRR without murmur. No JVD    Respiratory: CTA Bilaterally without wheezes or rales. Normal effort    GI: BS +, non-tender, non-distended  Extremities: No clubbing, cyanosis, or edema Skin: No evidence of breakdown, no evidence of rash Neurologic: Cranial nerves II through XII intact, motor strength is 4/5 in Left deltoid, bicep, tricep, grip, hip flexor, knee extensors, ankle dorsiflexor and plantar flexor, dysarthric speech 1/5 R finger flexion and biceps 2- hip adduction, knee ext, 0/5 in RIght ankle No resting tone Senses pain in right arm and leg Cerebellar exam unable to perform on RIght due to weakness Musculoskeletal: Full range of motion in all 4 extremities. No joint swelling Psych: pleasant, cooperative  Assessment/Plan: 1. Functional deficits secondary to RIght hemiparesis and aphasia which require 3+ hours per day of interdisciplinary therapy in a comprehensive  inpatient rehab setting.  Physiatrist is providing close team supervision and 24 hour management of active medical problems listed below.  Physiatrist and rehab team continue to assess barriers to discharge/monitor patient progress toward functional and medical goals  Care Tool:  Bathing    Body parts bathed by patient: Right arm, Chest, Abdomen, Front perineal area, Right upper leg, Left upper leg, Face   Body parts bathed by helper: Left arm, Buttocks, Right lower leg, Left lower leg     Bathing assist Assist Level: Maximal Assistance - Patient 24 - 49%     Upper Body Dressing/Undressing Upper body dressing   What is the patient wearing?: Pull over shirt    Upper body assist Assist Level: Maximal Assistance - Patient 25 - 49%    Lower Body Dressing/Undressing Lower body dressing      What is the patient wearing?: Underwear/pull up, Pants     Lower body assist Assist for lower body dressing: Maximal Assistance - Patient 25 - 49%     Toileting Toileting    Toileting assist Assist for toileting: Total Assistance - Patient < 25%     Transfers Chair/bed transfer  Transfers assist     Chair/bed transfer assist level: Moderate Assistance - Patient 50 - 74%     Locomotion Ambulation   Ambulation assist   Ambulation activity did not occur: Safety/medical concerns  Assist level: Moderate Assistance - Patient 50 - 74% Assistive device: Other (comment)(none) Max distance: 15'   Walk 10 feet activity   Assist     Assist level: Moderate Assistance - Patient - 50 - 74% Assistive device:  Other (comment)(none)   Walk 50 feet activity   Assist Walk 50 feet with 2 turns activity did not occur: Safety/medical concerns  Assist level: Moderate Assistance - Patient - 50 - 74% Assistive device: Walker-rolling    Walk 150 feet activity   Assist Walk 150 feet activity did not occur: Safety/medical concerns         Walk 10 feet on uneven surface   activity   Assist Walk 10 feet on uneven surfaces activity did not occur: Safety/medical concerns         Wheelchair     Assist Will patient use wheelchair at discharge?: Yes Type of Wheelchair: Manual    Wheelchair assist level: Minimal Assistance - Patient > 75% Max wheelchair distance: 100'    Wheelchair 50 feet with 2 turns activity    Assist    Wheelchair 50 feet with 2 turns activity did not occur: Safety/medical concerns   Assist Level: Minimal Assistance - Patient > 75%   Wheelchair 150 feet activity     Assist Wheelchair 150 feet activity did not occur: Safety/medical concerns         Medical Problem List and Plan: 1.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 DVT Prophylaxis/Anticoagulation: Venous Dopplers lower extremity negative.Subcutaneous Lovenox for DVT prophylaxis 3. Pain Management:Tylenol as needed. No pain at present 4. Mood:Prozac 20 mg daily. Provide ego support as able 5. Neuropsych: This patientiscapable of making decisions on hisown behalf. 6. Skin/Wound Care:Routine skin checks 7. Fluids/Electrolytes/Nutrition:Routine in and out's with follow-up chemistries I 26ml on 2/13- enc fluids 8. Dysphagia. Dysphagia #2 nectar thick liquids- BMET normal-advance diet as appropriate 9. Hyperlipidemia. Lipitor 10. Permissive hypertension. Patient on no antihypertensive medications on admission Vitals:   09/08/18 2120 09/09/18 0448  BP: 132/67 136/71  Pulse: (!) 59 (!) 52  Resp: 16 20  Temp: 97.9 F (36.6 C) 98.5 F (36.9 C)  SpO2: 97% 95%  -good control 2/15 11. BPH. Uroxatral 10 mg 3 times a day once per day on Monday Wednesday Friday. Check PVRs 12. Possible right wrist  fracture/Triquetrum.. Conservative care. Follow-up orthopedic service Dr. Stann Mainland. Weightbearing as tolerated with splint in place, will order Xray in ~2wks   13.  Constipation unresponsive to supp or oral laxative,  Fleets without results  -sorbitol today, SSE if needed     LOS: 4 days A FACE TO FACE EVALUATION WAS PERFORMED  Meredith Staggers 09/09/2018, 10:08 AM

## 2018-09-10 ENCOUNTER — Inpatient Hospital Stay (HOSPITAL_COMMUNITY): Payer: BLUE CROSS/BLUE SHIELD

## 2018-09-10 NOTE — Progress Notes (Signed)
Denies pain. Splint in place to right wrist/hand. No BM after PRN sorbitol given on previous shift. Refused SSE at HS, wants in AM after breakfast. Abdomen soft, BS (+) X 4 quads. Continent thus far with timed toileting every 3 hours. Loop recorder dressing with old drainage. Edward Glass A

## 2018-09-10 NOTE — Progress Notes (Signed)
[place] PHYSICAL MEDICINE & REHABILITATION PROGRESS NOTE   Subjective/Complaints:  Patient denies any new issues.  Able to sleep fairly well last night.  Notices some improvement in his right hand movement.  ROS: limited due to language/communication   Objective:   No results found. No results for input(s): WBC, HGB, HCT, PLT in the last 72 hours. No results for input(s): NA, K, CL, CO2, GLUCOSE, BUN, CREATININE, CALCIUM in the last 72 hours.  Intake/Output Summary (Last 24 hours) at 09/10/2018 0915 Last data filed at 09/10/2018 0900 Gross per 24 hour  Intake 720 ml  Output 250 ml  Net 470 ml     Physical Exam: Vital Signs Blood pressure 132/71, pulse (!) 53, temperature 98 F (36.7 C), resp. rate 14, height 5\' 5"  (1.651 m), weight 74.7 kg, SpO2 97 %.   Constitutional: No distress . Vital signs reviewed. HEENT: EOMI, oral membranes moist Neck: supple Cardiovascular: RRR without murmur. No JVD    Respiratory: CTA Bilaterally without wheezes or rales. Normal effort    GI: BS +, non-tender, non-distended  Extremities: No clubbing, cyanosis, or edema Skin: No evidence of breakdown, no evidence of rash Neurologic: Cranial nerves II through XII intact, motor strength is 4/5 in Left deltoid, bicep, tricep, grip, hip flexor, knee extensors, ankle dorsiflexor and plantar flexor, very dysarthric speech 1/5 R finger flexion and biceps--no changes today that I can see 2- hip adduction, knee ext, 0/5 in RIght ankle No resting tone Senses pain in right arm and leg Cerebellar exam unable to perform on RIght due to weakness Musculoskeletal: Full range of motion in all 4 extremities. No joint swelling Psych: pleasant, cooperative  Assessment/Plan: 1. Functional deficits secondary to RIght hemiparesis and aphasia which require 3+ hours per day of interdisciplinary therapy in a comprehensive inpatient rehab setting.  Physiatrist is providing close team supervision and 24 hour  management of active medical problems listed below.  Physiatrist and rehab team continue to assess barriers to discharge/monitor patient progress toward functional and medical goals  Care Tool:  Bathing    Body parts bathed by patient: Right arm, Chest, Abdomen, Front perineal area, Right upper leg, Left upper leg, Face   Body parts bathed by helper: Left arm, Buttocks, Right lower leg, Left lower leg     Bathing assist Assist Level: Maximal Assistance - Patient 24 - 49%     Upper Body Dressing/Undressing Upper body dressing   What is the patient wearing?: Pull over shirt    Upper body assist Assist Level: Maximal Assistance - Patient 25 - 49%    Lower Body Dressing/Undressing Lower body dressing      What is the patient wearing?: Underwear/pull up, Pants     Lower body assist Assist for lower body dressing: Maximal Assistance - Patient 25 - 49%     Toileting Toileting    Toileting assist Assist for toileting: Total Assistance - Patient < 25%     Transfers Chair/bed transfer  Transfers assist     Chair/bed transfer assist level: Moderate Assistance - Patient 50 - 74%     Locomotion Ambulation   Ambulation assist   Ambulation activity did not occur: Safety/medical concerns  Assist level: Moderate Assistance - Patient 50 - 74% Assistive device: Other (comment)(none) Max distance: 15'   Walk 10 feet activity   Assist     Assist level: Moderate Assistance - Patient - 50 - 74% Assistive device: Other (comment)(none)   Walk 50 feet activity   Assist Walk 50  feet with 2 turns activity did not occur: Safety/medical concerns  Assist level: Moderate Assistance - Patient - 50 - 74% Assistive device: Walker-rolling    Walk 150 feet activity   Assist Walk 150 feet activity did not occur: Safety/medical concerns         Walk 10 feet on uneven surface  activity   Assist Walk 10 feet on uneven surfaces activity did not occur: Safety/medical  concerns         Wheelchair     Assist Will patient use wheelchair at discharge?: Yes Type of Wheelchair: Manual    Wheelchair assist level: Minimal Assistance - Patient > 75% Max wheelchair distance: 100'    Wheelchair 50 feet with 2 turns activity    Assist    Wheelchair 50 feet with 2 turns activity did not occur: Safety/medical concerns   Assist Level: Minimal Assistance - Patient > 75%   Wheelchair 150 feet activity     Assist Wheelchair 150 feet activity did not occur: Safety/medical concerns         Medical Problem List and Plan: 1.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 DVT Prophylaxis/Anticoagulation: Venous Dopplers lower extremity negative.Subcutaneous Lovenox for DVT prophylaxis 3. Pain Management:Tylenol as needed. No pain at present 4. Mood:Prozac 20 mg daily. Provide ego support as able 5. Neuropsych: This patientiscapable of making decisions on hisown behalf. 6. Skin/Wound Care:Routine skin checks 7. Fluids/Electrolytes/Nutrition:Routine in and out's with follow-up chemistries I 216ml on 2/13- enc fluids 8. Dysphagia. Dysphagia #2 nectar thick liquids- BMET normal, tolerating current diet-  advance diet as appropriate 9. Hyperlipidemia. Lipitor 10. Permissive hypertension. Patient on no antihypertensive medications on admission Vitals:   09/09/18 2158 09/10/18 0423  BP: 121/78 132/71  Pulse: (!) 56 (!) 53  Resp: 17 14  Temp: 98.2 F (36.8 C) 98 F (36.7 C)  SpO2: 98% 97%  -good control 2/16 11. BPH. Uroxatral 10 mg 3 times a day once per day on Monday Wednesday Friday. Check PVRs 12. Possible right wrist fracture/Triquetrum.. Conservative care. Follow-up  orthopedic service Dr. Stann Mainland. Weightbearing as tolerated with splint in place, will order Xray in ~2wks   13.  Constipation unresponsive to supp or oral laxative,  Fleets without results  -Patient received sorbitol, refused soapsuds enema last night but will take this morning.     LOS: 5 days A FACE TO FACE EVALUATION WAS PERFORMED  Meredith Staggers 09/10/2018, 9:15 AM

## 2018-09-10 NOTE — Progress Notes (Signed)
Patient given soap SSE and put on BSC. Patient about to pass 2-3 small hard balls of stool. Will continue to offer [facility] through the day. Nicholes Rough, LPN

## 2018-09-10 NOTE — Progress Notes (Signed)
Physical Therapy Session Note  Patient Details  Name: Edward Glass MRN: [id number] Date of Birth: 03/28/1956  Today's Date: 09/10/2018 PT Individual Time: 1300-1400 PT Individual Time Calculation (min): 60 min   Short Term Goals: Week 1:  PT Short Term Goal 1 (Week 1): Pt will perform bed<>chair transfer with mod assist PT Short Term Goal 2 (Week 1): Pt sill perform sit<>stand with min assist PT Short Term Goal 3 (Week 1): Pt will propel w/c x 50 ft with supervision PT Short Term Goal 4 (Week 1): Pt will ambulate x 30 ft with LRAD and mod assist  Skilled Therapeutic Interventions/Progress Updates:    Pt supine in bed upon PT arrival, agreeable to therapy tx and denies pain. Pt transferred to sitting with min assist and performed stand pivot with mod assist to w/c. Pt transported to the gym. Pt performed stand pivot to mat with mod assist, verbal cues for techniques. Pt performed x 10 sit<>stands this session from mat with min assist, emphasis on symmetric LE weightbearing and eccentric control with sitting. Pt worked on R LE stance control to performed L LE stepping in place, no AD with mod assist and verbal/tactile cues for R quad activation. Pt standing with RW worked on pre-gait stepping forward/backward in place with each LE min-mod assist for balance, x 1 trial with R LE ace wrapped for DF and x 1 trial with AFO and shoe cover. Pt ambulated x 32 ft this session with RW and mod assist, R LE AFO and shoe cover for increased foot clearance during swing, pt able to advance R LE with therapist providing hip flexor facilitation/tactile cues at him, tactile cues for quad activation during R stance and assist with steering RW. Pt transferred to mat and transferred to supine with min assist. Pt performed R LE exercises for neuro re-ed including 2 x 10 each: bridges, active assisted hip flexion, PNF D2 extension with resistance, active assisted hip abduction/adduction. Pt transferred to sitting with  min assist, cues for techniques. Pt performed blocked practice of stand pivot transfers from w/c<>mat x 4 in each direction with min-mod assist, verbal cues for techniques. Pt transported back to room and left in w/c with needs in reach and chair alarm set.   Therapy Documentation Precautions:  Precautions Precautions: Fall Precaution Comments: R hemiplegia; wrist in splint due to fx; shoulder pain Restrictions Weight Bearing Restrictions: No   Therapy/Group: Individual Therapy  Netta Corrigan, PT, DPT 09/10/2018, 8:00 AM

## 2018-09-11 ENCOUNTER — Inpatient Hospital Stay (HOSPITAL_COMMUNITY): Payer: BLUE CROSS/BLUE SHIELD

## 2018-09-11 ENCOUNTER — Inpatient Hospital Stay (HOSPITAL_COMMUNITY): Payer: BLUE CROSS/BLUE SHIELD | Admitting: Speech Pathology

## 2018-09-11 NOTE — Progress Notes (Signed)
[place] PHYSICAL MEDICINE & REHABILITATION PROGRESS NOTE   Subjective/Complaints:  Still needes assist for showering, no specific c/o other than acute on chronic R shoulder pain.  ROS: limited due to language/communication   Objective:   No results found. No results for input(s): WBC, HGB, HCT, PLT in the last 72 hours. No results for input(s): NA, K, CL, CO2, GLUCOSE, BUN, CREATININE, CALCIUM in the last 72 hours.  Intake/Output Summary (Last 24 hours) at 09/11/2018 0805 Last data filed at 09/11/2018 0531 Gross per 24 hour  Intake 480 ml  Output 850 ml  Net -370 ml     Physical Exam: Vital Signs Blood pressure 129/80, pulse 60, temperature 98.5 F (36.9 C), temperature source Oral, resp. rate 18, height 5\' 5"  (1.651 m), weight 74.7 kg, SpO2 97 %.   Constitutional: No distress . Vital signs reviewed. HEENT: EOMI, oral membranes moist Neck: supple Cardiovascular: RRR without murmur. No JVD    Respiratory: CTA Bilaterally without wheezes or rales. Normal effort    GI: BS +, non-tender, non-distended  Extremities: No clubbing, cyanosis, or edema Skin: No evidence of breakdown, no evidence of rash Neurologic: Cranial nerves II through XII intact, motor strength is 4/5 in Left deltoid, bicep, tricep, grip, hip flexor, knee extensors, ankle dorsiflexor and plantar flexor, very dysarthric speech 1/5 R finger flexion and biceps--no changes today that I can see 2- hip adduction, knee ext, 0/5 in RIght ankle No resting tone Senses pain in right arm and leg Cerebellar exam unable to perform on RIght due to weakness Musculoskeletal:pain with RIght shoujder abd, pain over R AC joint  Psych: pleasant, cooperative  Assessment/Plan: 1. Functional deficits secondary to RIght hemiparesis and aphasia which require 3+ hours per day of interdisciplinary therapy in a comprehensive inpatient rehab setting.  Physiatrist is providing close team supervision and 24 hour management of active  medical problems listed below.  Physiatrist and rehab team continue to assess barriers to discharge/monitor patient progress toward functional and medical goals  Care Tool:  Bathing    Body parts bathed by patient: Right arm, Chest, Abdomen, Front perineal area, Right upper leg, Left upper leg, Face   Body parts bathed by helper: Left arm, Buttocks, Right lower leg, Left lower leg     Bathing assist Assist Level: Maximal Assistance - Patient 24 - 49%     Upper Body Dressing/Undressing Upper body dressing   What is the patient wearing?: Pull over shirt    Upper body assist Assist Level: Maximal Assistance - Patient 25 - 49%    Lower Body Dressing/Undressing Lower body dressing      What is the patient wearing?: Underwear/pull up, Pants     Lower body assist Assist for lower body dressing: Maximal Assistance - Patient 25 - 49%     Toileting Toileting    Toileting assist Assist for toileting: Total Assistance - Patient < 25%     Transfers Chair/bed transfer  Transfers assist     Chair/bed transfer assist level: Moderate Assistance - Patient 50 - 74%     Locomotion Ambulation   Ambulation assist   Ambulation activity did not occur: Safety/medical concerns  Assist level: Moderate Assistance - Patient 50 - 74% Assistive device: Walker-rolling Max distance: 32 ft   Walk 10 feet activity   Assist     Assist level: Moderate Assistance - Patient - 50 - 74% Assistive device: Walker-rolling, Orthosis   Walk 50 feet activity   Assist Walk 50 feet with 2 turns activity  did not occur: Safety/medical concerns  Assist level: Moderate Assistance - Patient - 50 - 74% Assistive device: Walker-rolling    Walk 150 feet activity   Assist Walk 150 feet activity did not occur: Safety/medical concerns         Walk 10 feet on uneven surface  activity   Assist Walk 10 feet on uneven surfaces activity did not occur: Safety/medical concerns          Wheelchair     Assist Will patient use wheelchair at discharge?: Yes Type of Wheelchair: Manual    Wheelchair assist level: Minimal Assistance - Patient > 75% Max wheelchair distance: 100'    Wheelchair 50 feet with 2 turns activity    Assist    Wheelchair 50 feet with 2 turns activity did not occur: Safety/medical concerns   Assist Level: Minimal Assistance - Patient > 75%   Wheelchair 150 feet activity     Assist Wheelchair 150 feet activity did not occur: Safety/medical concerns         Medical Problem List and Plan: 1.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 DVT Prophylaxis/Anticoagulation: Venous Dopplers lower extremity negative.Subcutaneous Lovenox for DVT prophylaxis 3. Pain Management:Tylenol as needed. No pain at present 4. Mood:Prozac 20 mg daily. Provide ego support , discussed recovery process and that it is not always linear but may come in spurts, , pt feels discourged , ask neuropsych to see 5. Neuropsych: This patientiscapable of making decisions on hisown behalf. 6. Skin/Wound Care:Routine skin checks 7. Fluids/Electrolytes/Nutrition:Routine in and out's with follow-up chemistries I 425ml on 2/16- enc fluids 8. Dysphagia. Dysphagia #2 nectar thick liquids- BMET normal, tolerating current diet-  advance diet as appropriate 9. Hyperlipidemia. Lipitor 10. Permissive hypertension. Patient on no antihypertensive medications on admission Vitals:   09/10/18 1913 09/11/18 0526  BP: 123/72 129/80  Pulse: 60 60  Resp: 18 18  Temp: 98.2 F (36.8 C) 98.5 F (36.9 C)  SpO2: 98% 97%  -good control 2/17 11. BPH. Uroxatral 10 mg 3 times a day once per day on Monday Wednesday  Friday. Check PVRs 12. Possible right wrist fracture/Triquetrum.. Conservative care. Follow-up orthopedic service Dr. Stann Mainland. Weightbearing as tolerated with splint in place, will order Xray in ~1wk - no pain with gentle ROM of RIght wrist  13.  Constipation unresponsive to supp or oral laxative,  Fleets without results  -Patient received sorbitol, refused soapsuds enema last night but will take this morning.  BM x 2 on 2/16   LOS: 6 days A FACE TO FACE EVALUATION WAS PERFORMED  [name] 09/11/2018, 8:05 AM

## 2018-09-11 NOTE — Progress Notes (Signed)
Physical Therapy Session Note  Patient Details  Name: Edward Glass MRN: [id number] Date of Birth: [birth year]  Today's Date: 09/11/2018 PT Individual Time: 1308-6578 PT Individual Time Calculation (min): 72 min   Short Term Goals: Week 1:  PT Short Term Goal 1 (Week 1): Pt will perform bed<>chair transfer with mod assist PT Short Term Goal 2 (Week 1): Pt sill perform sit<>stand with min assist PT Short Term Goal 3 (Week 1): Pt will propel w/c x 50 ft with supervision PT Short Term Goal 4 (Week 1): Pt will ambulate x 30 ft with LRAD and mod assist  Skilled Therapeutic Interventions/Progress Updates:    Pt supine in bed upon PT arrival, agreeable to therapy tx and denies pain. Pt transferred to sitting with min assist and performed min assist stand pivot to w/c. Donned shoes and R AFO total assist. Pt transported to the gym. Pt transferred to tall kneeling on mat with bench for UE support. In tall kneeling pt worked on postural control and hip strengthening while performing reaching task with L UE. Pt in modified quadruped on elbows using bench worked on R UE weightbearing while performing reaching activity with L UE. Pt in tall kneeling worked on postural control while therapist applied manual perturbations. Pt transferred to sitting with min assist. Pt ambulated x 32 ft this session with RW and mod assist, R LE AFO and shoe cover for increased foot clearance during swing, pt able to advance R LE with therapist providing hip flexor facilitation/tactile cues at him, tactile cues for quad activation during R stance and assist with steering RW. Pt ambulated x 30 ft at the rail this session with min assist, verbal cues for techniques. Pt worked on R swing phase while kicking cone x 10, worked on R stance control while kicking cones with L LE. Pt worked on [employer] and knee control to perform mini squats, sit<>stands with L LE more extended and sit<>stands with L LE on 2 inch step to increased weightbearing  through R LE. Pt transported back to room and left with needs in reach and chair alarm set.   Therapy Documentation Precautions:  Precautions Precautions: Fall Precaution Comments: R hemiplegia; wrist in splint due to fx; shoulder pain Restrictions Weight Bearing Restrictions: No    Therapy/Group: Individual Therapy  Netta Corrigan, PT, DPT 09/11/2018, 7:50 AM

## 2018-09-11 NOTE — Progress Notes (Signed)
Occupational Therapy Session Note  Patient Details  Name: Edward Glass MRN: [id number] Date of Birth: 09-04-1955  Today's Date: 09/11/2018 OT Individual Time: 4098-1191 OT Individual Time Calculation (min): 75 min    Short Term Goals: Week 1:  OT Short Term Goal 1 (Week 1): Pt will transfer to/ from toilet with mod A consistently OT Short Term Goal 2 (Week 1): Pt will don UB clothing with min A  OT Short Term Goal 3 (Week 1): Pt with perform one grooming task in standing with mod A for balance OT Short Term Goal 4 (Week 1): Pt will perform bed mobility in prep for ADL with mod A (to come to EOB) OT Short Term Goal 5 (Week 1): Pt will demonstrate safe placement of right UE at rest.   Skilled Therapeutic Interventions/Progress Updates:    Session focused on bathing/dressing at shower level. Pt with no c/o pain at rest, c/o shoulder pain as described below during AAROM/PROM. Pt completed bed mobility with min A for R LE positioning. Pt completed SPT > w/c > TTB with mod A. Cueing for pacing/positioning of UE. Pt required mod A to doff clothing sitting on TTB in walk in shower. Pt completed UB bathing with min A for positioning R UE to be able reach underneath. Mod standing balance support for pt to clean posteriorly. Pt transferred back to w/c with min A. Pt donned shirt with mod A, cueing and demo for hemi dressing technique. Mod A to don pants, additional cueing for hemi technique and standing balance support. Pt was able to don L sock with positioning assistance to keep leg in figure 4 and demo re single hand technique. Pt unable to complete again for R LE, but was able to pull sock up once positioned over toes. Pt completed oral hygiene at sink with set up assistance. Pt also completed shaving with cueing for safety awareness and assistance in set up. Pt was provided with a R elbow protector d/t skin showing signs of breakdown from resting on arm rest. Pt given demo re gravity eliminated  exercises for R UE during rest breaks between therapy. Pt left sitting up in w/c with daughter and RN present, all needs met.   Therapy Documentation Precautions:  Precautions Precautions: Fall Precaution Comments: R hemiplegia; wrist in splint due to fx; shoulder pain Restrictions Weight Bearing Restrictions: No Pain: Pain Assessment Pain Scale: 0-10 Pain Score: 8  Pain Type: Acute pain Pain Location: Shoulder Pain Orientation: Right Pain Descriptors / Indicators: Grimacing;Guarding Pain Onset: With Activity Pain Intervention(s): Relaxation;Rest;Therapeutic touch   Therapy/Group: Individual Therapy  Curtis Sites 09/11/2018, 8:54 AM

## 2018-09-11 NOTE — Progress Notes (Signed)
Speech Language Pathology Daily Session Note  Patient Details  Name: Edward Glass MRN: [id number] Date of Birth: [birth year]  Today's Date: 09/11/2018 SLP Individual Time: 1000-1055 SLP Individual Time Calculation (min): 55 min  Short Term Goals: Week 1: SLP Short Term Goal 1 (Week 1): Patient will consume currnet diet with minimal overt s/s of aspiration with supervision level verbal cues for use of compensatory strategies.  SLP Short Term Goal 2 (Week 1): Patient will consume trials of thin liquids via cup with minimal overt s/s of aspiration over 2 sessions with supervision level verbal cues to assess readiness for repeat MBS.  SLP Short Term Goal 3 (Week 1): Patient will utilize speech intelligibility strategies to maximize intelligibility to ~75% at the phrase level with Min A verbal cues.  SLP Short Term Goal 4 (Week 1): Patient will express basic wants/needs at the phrase level with Min A multimodal cues.  SLP Short Term Goal 5 (Week 1): Patient will demonstrate selective attention to functional tasks for ~45 minutes with Min A verbal cues for redirection.  SLP Short Term Goal 6 (Week 1): Patient will demonstrate functional problem solving for basic and familiar tasks with Min A verbal cues.   Skilled Therapeutic Interventions: Skilled treatment session focused on dysphagia and communication goals. SLP facilitated session by providing oral care prior to trials of thin liquids. Patient consumed trials of thin liquids via cup without overt s/s of aspiration and supervision level verbal cues needed for use of small sips. Recommend repeat MBS tomorrow to assess swallow function and possible readiness for upgrade. SLP also facilitated session by providing Mod A verbal cues for use of speech intelligibility strategies to achieve ~75% intelligibility at the phrase level. Mod A question and phonemic cues also required for recall of procedures and word-finding during a verbal description task at the  phrase and sentence task. However, improved word-finding noted during an informal conversation. Patient left upright in bed with alarm on and all needs within reach. Continue with current plan of care.      Pain No/Denies Pain   Therapy/Group: Individual Therapy  Edward Glass 09/11/2018, 11:21 AM

## 2018-09-12 ENCOUNTER — Encounter (HOSPITAL_COMMUNITY): Payer: BLUE CROSS/BLUE SHIELD | Admitting: Speech Pathology

## 2018-09-12 ENCOUNTER — Inpatient Hospital Stay (HOSPITAL_COMMUNITY): Payer: BLUE CROSS/BLUE SHIELD

## 2018-09-12 ENCOUNTER — Inpatient Hospital Stay (HOSPITAL_COMMUNITY): Payer: Self-pay

## 2018-09-12 MED ORDER — SENNOSIDES-DOCUSATE SODIUM 8.6-50 MG PO TABS
2.0000 | ORAL_TABLET | Freq: Two times a day (BID) | ORAL | Status: DC
  Administered 2018-09-12 – 2018-09-18 (×13): 2 via ORAL
  Filled 2018-09-12 (×13): qty 2

## 2018-09-12 NOTE — Progress Notes (Signed)
Occupational Therapy Session Note  Patient Details  Name: Edward Glass MRN: [id number] Date of Birth: 1955/08/16  Today's Date: 09/12/2018 OT Individual Time: 2841-3244 OT Individual Time Calculation (min): 75 min    Short Term Goals: Week 1:  OT Short Term Goal 1 (Week 1): Pt will transfer to/ from toilet with mod A consistently OT Short Term Goal 2 (Week 1): Pt will don UB clothing with min A  OT Short Term Goal 3 (Week 1): Pt with perform one grooming task in standing with mod A for balance OT Short Term Goal 4 (Week 1): Pt will perform bed mobility in prep for ADL with mod A (to come to EOB) OT Short Term Goal 5 (Week 1): Pt will demonstrate safe placement of right UE at rest.   Skilled Therapeutic Interventions/Progress Updates:    OT intervention with focus on BADL retraining, functional transfers, sit<>stand, standing balance, activity tolerance, and safety awareness to increase independence with BADLs. Pt engaged in bathing at shower level and dressing with sit<>stand from w/c at sink.  See below for assist levels.  Pt required max multimodal cues for hemi dressing/bathing techniques.  Pt unable to return demonstrate bathing techniques. Pt requires min A for sit<>stand and CGA for standing balance to pull pants over hips.  Pt also engaged in self feeding breakfast.  Pt required min verbal cues for rate and portion management. Pt returned to bed awaiting transport for MBS.   Therapy Documentation Precautions:  Precautions Precautions: Fall Precaution Comments: R hemiplegia; wrist in splint due to fx; shoulder pain Restrictions Weight Bearing Restrictions: No Pain: Pain Assessment Faces Pain Scale: Hurts whole lot Pain Type: Chronic pain;Neuropathic pain Pain Location: Shoulder Pain Orientation: Right Pain Descriptors / Indicators: Contraction;Cramping;Grimacing Pain Frequency: Intermittent Pain Onset: With Activity Pain Intervention(s): RN made  aware;Repositioned ADL: ADL Eating: Supervision/safety, Minimal cueing Where Assessed-Eating: Wheelchair Grooming: Supervision/safety Where Assessed-Grooming: Sitting at sink Upper Body Bathing: Minimal assistance Where Assessed-Upper Body Bathing: Shower Lower Body Bathing: Moderate assistance Where Assessed-Lower Body Bathing: Shower Upper Body Dressing: Moderate cueing, Minimal assistance Where Assessed-Upper Body Dressing: Wheelchair Lower Body Dressing: Moderate assistance, Moderate cueing Where Assessed-Lower Body Dressing: Wheelchair, Standing at sink Toileting: Maximal assistance Where Assessed-Toileting: Glass blower/designer: Psychiatric nurse Method: Arts development officer: Energy manager: Minimal assistance, Moderate cueing Social research officer, government Method: Radiographer, therapeutic: Grab bars, Shower seat without back, Transfer tub bench   Therapy/Group: Individual Therapy  [name] 09/12/2018, 8:22 AM

## 2018-09-12 NOTE — Progress Notes (Signed)
Speech Language Pathology Weekly Progress and Session Note  Patient Details  Name: Edward Glass MRN: [id number] Date of Birth: [birth year]  Beginning of progress report period: September 05, 2018 End of progress report period: September 12, 2018  Today's Date: 09/12/2018 SLP Individual Time: 2500-3704 SLP Individual Time Calculation (min): 30 min  Short Term Goals: Week 1: SLP Short Term Goal 1 (Week 1): Patient will consume currnet diet with minimal overt s/s of aspiration with supervision level verbal cues for use of compensatory strategies.  SLP Short Term Goal 1 - Progress (Week 1): Met SLP Short Term Goal 2 (Week 1): Patient will consume trials of thin liquids via cup with minimal overt s/s of aspiration over 2 sessions with supervision level verbal cues to assess readiness for repeat MBS.  SLP Short Term Goal 2 - Progress (Week 1): Met SLP Short Term Goal 3 (Week 1): Patient will utilize speech intelligibility strategies to maximize intelligibility to ~75% at the phrase level with Min A verbal cues.  SLP Short Term Goal 3 - Progress (Week 1): Not met SLP Short Term Goal 4 (Week 1): Patient will express basic wants/needs at the phrase level with Min A multimodal cues.  SLP Short Term Goal 4 - Progress (Week 1): Met SLP Short Term Goal 5 (Week 1): Patient will demonstrate selective attention to functional tasks for ~45 minutes with Min A verbal cues for redirection.  SLP Short Term Goal 5 - Progress (Week 1): Met SLP Short Term Goal 6 (Week 1): Patient will demonstrate functional problem solving for basic and familiar tasks with Min A verbal cues.  SLP Short Term Goal 6 - Progress (Week 1): Not met    New Short Term Goals: Week 2: SLP Short Term Goal 1 (Week 2): Patient will utilize speech intelligibility strategies to maximize intelligibility to ~75% at the phrase level with Min A verbal cues.  SLP Short Term Goal 2 (Week 2): Patient will demonstrate functional problem solving for  basic and familiar tasks with Min A verbal cues.  SLP Short Term Goal 3 (Week 2): Patient will demonstrate selective attention to functional tasks for ~45 minutes with Supervision verbal cues for redirection.  SLP Short Term Goal 4 (Week 2): Patient will express basic wants/needs at the phrase level with Supervision multimodal cues.  SLP Short Term Goal 5 (Week 2): Patient will consume upgraded diet of Dys. 2 textures with thin liquids with minimal overt s/s of aspiration and supervision verbal cues for use of swallowing strategies.  SLP Short Term Goal 6 (Week 2): Patient will demonstrate efficient mastication and complete oral clearance with minimal overt s/s of aspiration with trials of Dys. 3 textures over 2 sessions prior to upgrade.   Weekly Progress Updates: Patient has made excellent gains and has met 4 of 6 STGs this reporting period. Patient had repeat MBS today and upgraded to thin liquids but recommended to continue Dys. 2 textures. Patient consuming current diet with minimal overt s/s of aspiration with supervision verbal cues needed for use of swallowing compensatory strategies. Patient demonstrates improved verbal expression with increased ability to express wants/needs at the phrase level with overall Min A verbal cues but continues to require Mod-Max A verbal cues for use of an increased vocal intensity to achieve ~50-75% intelligibility. Patient continues to demonstrate impaired word-finding, especially during structured language tasks. Patient's overall selective attention to tasks has improved but continues to require Mod A verbal cues for functional problem solving and safety awareness. Patient and family  education ongoing. Patient would benefit from continued skilled SLP intervention to maximize his cognitive-linguistic and swallowing function prior to discharge.       Intensity: Minumum of 1-2 x/day, 30 to 90 minutes Frequency: 3 to 5 out of 7 days Duration/Length of Stay: 19-25  days  Treatment/Interventions: Cognitive remediation/compensation;Environmental controls;Internal/external aids;Speech/Language facilitation;Therapeutic Activities;Patient/family education;Functional tasks;Dysphagia/aspiration precaution training;Cueing hierarchy   Daily Session  Skilled Therapeutic Interventions: Skilled treatment session focused on dysphagia goals. Upon arrival, patient's daughter present and both the patient and his daughter were educated in regards to patient's current swallowing function, diet recommendations, and swallowing compensatory strategies. MBS video also utilized to reinforce education. Both verbalized understanding and agreement. Patient consumed lunch meal of Dys. 2 textures with thin liquids via straw without overt s/s of aspiration with the exception of throat clear X 2 after burping, question reflux. Recommend patient continue current diet with full supervision. Patient left upright in wheelchair with alarm on and visitors present. Continue with current plan of care.       Pain No/Denies Pain   Therapy/Group: Individual Therapy  ,  09/12/2018, 2:21 PM

## 2018-09-12 NOTE — Plan of Care (Signed)
  Problem: Consults Goal: RH STROKE PATIENT EDUCATION Description See Patient Education module for education specifics  Outcome: Progressing   Problem: RH SKIN INTEGRITY Goal: RH STG SKIN FREE OF INFECTION/BREAKDOWN Description No new breakdown with min assist   Outcome: Progressing   Problem: RH SAFETY Goal: RH STG ADHERE TO SAFETY PRECAUTIONS W/ASSISTANCE/DEVICE Description STG Adhere to Safety Precautions With min Assistance/Device.  Outcome: Progressing   Problem: RH COGNITION-NURSING Goal: RH STG ANTICIPATES NEEDS/CALLS FOR ASSIST W/ASSIST/CUES Description STG Anticipates Needs/Calls for Assist With supervision Assistance/Cues.  Outcome: Progressing   Problem: RH PAIN MANAGEMENT Goal: RH STG PAIN MANAGED AT OR BELOW PT'S PAIN GOAL Description < 3 out of 10   Outcome: Progressing   Problem: RH Vision Goal: RH LTG Vision (Specify) Outcome: Progressing   Problem: RH BOWEL ELIMINATION Goal: RH STG MANAGE BOWEL WITH ASSISTANCE Description STG Manage Bowel with mod  Assistance.  Outcome: Progressing Goal: RH STG MANAGE BOWEL W/MEDICATION W/ASSISTANCE Description STG Manage Bowel with Medication with Assistance. Outcome: Progressing   Problem: RH BLADDER ELIMINATION Goal: RH STG MANAGE BLADDER WITH ASSISTANCE Description STG Manage Bladder With Assistance Outcome: Progressing

## 2018-09-12 NOTE — Progress Notes (Signed)
Occupational Therapy Weekly Progress Note  Patient Details  Name: Edward Glass MRN: [id number] Date of Birth: 1955-09-02  Beginning of progress report period: September 05, 2018 End of progress report period: September 12, 2018  Patient has met 5 of 5 short term goals.  Pt is making steady progress with BADLs and functional transfers since admission.  Pt requires mod verbal cues for safety awareness with transitional movements.  Pt requires max multimodal cues for hemi dressing/bathing techniques.  Pt requires CGA for standing balance when pulling pants over hips. Pt with significant RUE/R shoulder tone and pain from previous injury/arthritis(?). Pt is slightly impulsive and requires mod verbal cues for safety awareness.   Patient continues to demonstrate the following deficits: muscle weakness, unbalanced muscle activation, motor apraxia and decreased coordination, decreased attention to right, delayed processing and decreased standing balance, decreased postural control, hemiplegia and decreased balance strategies and therefore will continue to benefit from skilled OT intervention to enhance overall performance with BADL.  Patient progressing toward long term goals..  Continue plan of care.  OT Short Term Goals Week 1:  OT Short Term Goal 1 (Week 1): Pt will transfer to/ from toilet with mod A consistently OT Short Term Goal 1 - Progress (Week 1): Met OT Short Term Goal 2 (Week 1): Pt will don UB clothing with min A  OT Short Term Goal 2 - Progress (Week 1): Met OT Short Term Goal 3 (Week 1): Pt with perform one grooming task in standing with mod A for balance OT Short Term Goal 3 - Progress (Week 1): Met OT Short Term Goal 4 (Week 1): Pt will perform bed mobility in prep for ADL with mod A (to come to EOB) OT Short Term Goal 4 - Progress (Week 1): Met OT Short Term Goal 5 (Week 1): Pt will demonstrate safe placement of right UE at rest.  OT Short Term Goal 5 - Progress (Week 1):  Met Week 2:  OT Short Term Goal 1 (Week 2): Pt will thread BLE into pants with min A in preparation for standing to pull pants over hips OT Short Term Goal 2 (Week 2): Pt will don socks with min A OT Short Term Goal 3 (Week 2): Pt will perform toileting tasks with mod A   [name] 09/12/2018, 12:21 PM

## 2018-09-12 NOTE — Progress Notes (Signed)
Occupational Therapy Session Note  Patient Details  Name: Edward Glass MRN: [id number] Date of Birth: 23-Jun-1956  Today's Date: 09/12/2018 OT Individual Time: 0802-2336 OT Individual Time Calculation (min): 30 min    Short Term Goals: Week 1:  OT Short Term Goal 1 (Week 1): Pt will transfer to/ from toilet with mod A consistently OT Short Term Goal 2 (Week 1): Pt will don UB clothing with min A  OT Short Term Goal 3 (Week 1): Pt with perform one grooming task in standing with mod A for balance OT Short Term Goal 4 (Week 1): Pt will perform bed mobility in prep for ADL with mod A (to come to EOB) OT Short Term Goal 5 (Week 1): Pt will demonstrate safe placement of right UE at rest.   Skilled Therapeutic Interventions/Progress Updates:    Pt resting in bed upon arrival and agreeable to therapy.  OT intervention with focus on RUE PROM and AAROM, bed mobility, functional transfers, toileting tasks, and safety awareness to increase independence with BADLs.  PROM <90 without pain.  Pt unable to tolerate PROM >90 degrees.  Pt with increase biceps/triceps activation and shoulder horizontal adduction.  Pt requires min A for supine>sit EOB in preparation for tranfser to w/c.  Pt performed stand pivot transfer to w/c with min A and min verbal cues for sequencing and safety awareness.  Pt requested to use toilet and transferred to toilet with min A using grab bars.  Pt requires max A for toileting tasks.  Pt requires mod verbal cues for safety awareness with transitional movements 2/2 impulsive behaviors.  Pt remained in w/c with all needs within reach and belt alarm activated.  Daughter persent.   Therapy Documentation Precautions:  Precautions Precautions: Fall Precaution Comments: R hemiplegia; wrist in splint due to fx; shoulder pain Restrictions Weight Bearing Restrictions: No  Pain: Pain Assessment Pain Scale: Faces Faces Pain Scale: Hurts a little bit Pain Type: Neuropathic pain Pain  Location: Shoulder Pain Orientation: Right Pain Descriptors / Indicators: Aching Pain Onset: On-going Pain Intervention(s): repositioned    Therapy/Group: Individual Therapy  [name] 09/12/2018, 12:16 PM

## 2018-09-12 NOTE — Progress Notes (Signed)
[facility] PHYSICAL MEDICINE & REHABILITATION PROGRESS NOTE   Subjective/Complaints:  Still needes assist for showering, no specific c/o other than acute on chronic R shoulder pain.  ROS: limited due to language/communication   Objective:   Dg Shoulder Right  Result Date: 09/11/2018 CLINICAL DATA:  Right shoulder pain EXAM: RIGHT SHOULDER - 2+ VIEW COMPARISON:  None. FINDINGS: AC joint is intact. Moderate arthritis at the inferior glenohumeral joint with suspicion of multiple calcified loose bodies. No acute fracture or dislocation. IMPRESSION: Moderate arthritis of the glenohumeral joint with suspected multiple calcified loose bodies. Electronically Signed   By: Donavan Foil M.D.   On: 09/11/2018 20:06   No results for input(s): WBC, HGB, HCT, PLT in the last 72 hours. No results for input(s): NA, K, CL, CO2, GLUCOSE, BUN, CREATININE, CALCIUM in the last 72 hours.  Intake/Output Summary (Last 24 hours) at 09/12/2018 0808 Last data filed at 09/12/2018 0150 Gross per 24 hour  Intake 360 ml  Output 750 ml  Net -390 ml     Physical Exam: Vital Signs Blood pressure 117/78, pulse (!) 55, temperature 98.3 F (36.8 C), temperature source Oral, resp. rate 18, height 5\' 5"  (1.651 m), weight 74.7 kg, SpO2 97 %.   Constitutional: No distress . Vital signs reviewed. HEENT: EOMI, oral membranes moist Neck: supple Cardiovascular: RRR without murmur. No JVD    Respiratory: CTA Bilaterally without wheezes or rales. Normal effort    GI: BS +, non-tender, non-distended  Extremities: No clubbing, cyanosis, or edema Skin: No evidence of breakdown, no evidence of rash Neurologic: Cranial nerves II through XII intact, motor strength is 4/5 in Left deltoid, bicep, tricep, grip, hip flexor, knee extensors, ankle dorsiflexor and plantar flexor, very dysarthric speech 1/5 R finger flexion and biceps--no changes today that I can see 2- hip adduction, knee ext, 0/5 in RIght ankle No resting  tone Senses pain in right arm and leg Cerebellar exam unable to perform on RIght due to weakness Musculoskeletal:pain with RIght shoujder abd, pain over R AC joint  Psych: pleasant, cooperative  Assessment/Plan: 1. Functional deficits secondary to RIght hemiparesis and aphasia which require 3+ hours per day of interdisciplinary therapy in a comprehensive inpatient rehab setting.  Physiatrist is providing close team supervision and 24 hour management of active medical problems listed below.  Physiatrist and rehab team continue to assess barriers to discharge/monitor patient progress toward functional and medical goals  Care Tool:  Bathing    Body parts bathed by patient: Right arm, Chest, Abdomen, Front perineal area, Right upper leg, Left upper leg, Face   Body parts bathed by helper: Left arm, Buttocks, Right lower leg, Left lower leg     Bathing assist Assist Level: Moderate Assistance - Patient 50 - 74%     Upper Body Dressing/Undressing Upper body dressing   What is the patient wearing?: Pull over shirt    Upper body assist Assist Level: Moderate Assistance - Patient 50 - 74%    Lower Body Dressing/Undressing Lower body dressing      What is the patient wearing?: Underwear/pull up, Pants     Lower body assist Assist for lower body dressing: Moderate Assistance - Patient 50 - 74%     Toileting Toileting    Toileting assist Assist for toileting: Total Assistance - Patient < 25%     Transfers Chair/bed transfer  Transfers assist     Chair/bed transfer assist level: Minimal Assistance - Patient > 75%     Locomotion Ambulation  Ambulation assist   Ambulation activity did not occur: Safety/medical concerns  Assist level: Moderate Assistance - Patient 50 - 74% Assistive device: Walker-rolling Max distance: 30 ft   Walk 10 feet activity   Assist     Assist level: Moderate Assistance - Patient - 50 - 74% Assistive device: Walker-rolling, Orthosis    Walk 50 feet activity   Assist Walk 50 feet with 2 turns activity did not occur: Safety/medical concerns  Assist level: Moderate Assistance - Patient - 50 - 74% Assistive device: Walker-rolling    Walk 150 feet activity   Assist Walk 150 feet activity did not occur: Safety/medical concerns         Walk 10 feet on uneven surface  activity   Assist Walk 10 feet on uneven surfaces activity did not occur: Safety/medical concerns         Wheelchair     Assist Will patient use wheelchair at discharge?: Yes Type of Wheelchair: Manual    Wheelchair assist level: Minimal Assistance - Patient > 75% Max wheelchair distance: 100'    Wheelchair 50 feet with 2 turns activity    Assist    Wheelchair 50 feet with 2 turns activity did not occur: Safety/medical concerns   Assist Level: Minimal Assistance - Patient > 75%   Wheelchair 150 feet activity     Assist Wheelchair 150 feet activity did not occur: Safety/medical concerns         Medical Problem List and Plan: 1.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 DVT Prophylaxis/Anticoagulation: Venous Dopplers lower extremity negative.Subcutaneous Lovenox for DVT prophylaxis 3. Pain Management:Tylenol as needed. 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 Mood:Prozac 20 mg daily. Provide ego support , discussed recovery process and that it is not always linear but may come in spurts, , pt feels discourged , ask neuropsych to see 5. Neuropsych:  This patientiscapable of making decisions on hisown behalf. 6. Skin/Wound Care:Routine skin checks 7. Fluids/Electrolytes/Nutrition:Routine in and out's with follow-up chemistries I 411ml on 2/16- enc fluids 8. Dysphagia. Dysphagia #2 nectar thick liquids- BMET normal, tolerating current diet-  advance diet as appropriate 9. Hyperlipidemia. Lipitor 10. Permissive hypertension. Patient on no antihypertensive medications on admission Vitals:   09/11/18 1943 09/12/18 0606  BP: 129/69 117/78  Pulse: (!) 56 (!) 55  Resp: 16 18  Temp: 98.3 F (36.8 C) 98.3 F (36.8 C)  SpO2: 96% 97%  -good control 2/17 11. BPH. Uroxatral 10 mg 3 times a day once per day on Monday Wednesday Friday. Check PVRs 12. Possible right wrist fracture/Triquetrum.. Conservative care. Follow-up orthopedic service Dr. Stann Mainland. Weightbearing as tolerated with splint in place, will order Xray in ~1wk - no pain with gentle ROM of RIght wrist  13.  Constipation unresponsive to supp or oral laxative,  Fleets without results  -Patient received sorbitol, refused soapsuds enema last night but will take this morning.  BM x 2 on 2/16   LOS: 7 days A FACE TO FACE EVALUATION WAS PERFORMED  [name] 09/12/2018, 8:08 AM

## 2018-09-12 NOTE — Progress Notes (Signed)
Physical Therapy Session Note  Patient Details  Name: Edward Glass MRN: [id number] Date of Birth: 04-Nov-1955  Today's Date: 09/12/2018 PT Individual Time: 4098-1191 PT Individual Time Calculation (min): 75 min   Short Term Goals: Week 1:  PT Short Term Goal 1 (Week 1): Pt will perform bed<>chair transfer with mod assist PT Short Term Goal 2 (Week 1): Pt sill perform sit<>stand with min assist PT Short Term Goal 3 (Week 1): Pt will propel w/c x 50 ft with supervision PT Short Term Goal 4 (Week 1): Pt will ambulate x 30 ft with LRAD and mod assist  Skilled Therapeutic Interventions/Progress Updates:    Pt supine in bed upon PT arrival, agreeable to therapy tx and denies pain. Therapist donned shoes and AFO this session. Pt transferred to sitting with CGA and verbal cues for techniques. Pt performed stand pivot to w/c with min assist. Pt transported to the gym. Pt participated in body weight supported treadmill training (BWSTT) this session for gait training. Pt performed sit<>stand with min assist in order to don harness and stepped up onto treadmill with +2 assist. Pt completed BWSTT throughout session with +2 assist for lateral weightshifting and R LE management with stepping: 4 minutes at 0.6 mph x 191 ft with L UE support, 4 minutes at 0.5 mph 168 ft with L UE support, 3 minute at 0.4 mph 125 ft without UE support and 3 minutes backwards ambulation with L UE support at 0.2 mph x65 ft. Following BWSTT, pt ambulated overground with B HHA mod assist +2, x 150 ft with therapists assisting with weightshifting, cues for sequencing and step length. Perfroemd backwards ambulation x 10 ft without AD mod assist +2. Performed sidestepping x 8ft in each direction with mod assist +2. Pt transported back to room and left in w/c with needs in reach and chair alarm set.   Therapy Documentation Precautions:  Precautions Precautions: Fall Precaution Comments: R hemiplegia; wrist in splint due to fx;  shoulder pain Restrictions Weight Bearing Restrictions: No   Therapy/Group: Individual Therapy  Netta Corrigan, PT, DPT 09/12/2018, 8:03 AM

## 2018-09-12 NOTE — Progress Notes (Signed)
Water protocol done at 0952. Oral care prior to water protocol. Pt completed approximately 313mL of water. Pt tolerated well. Slight burping and pt cleared throat afterwards. Continue plan of care.   Edward Glass W Omari Mcmanaway

## 2018-09-12 NOTE — Progress Notes (Signed)
Modified Barium Swallow Progress Note  Patient Details  Name: Edward Glass MRN: [id number] Date of Birth: February 21, 1956  Today's Date: 09/12/2018  Modified Barium Swallow completed.  Full report located under Chart Review in the Imaging Section.  Brief recommendations include the following:  Clinical Impression  Patient continues to demonstrate a mild oropharyngeal dysphagia. Oral phase is characterized by reduce bolus cohesion and containment and incomplete mastication of solids prior to them spilling into his pharynx.  Timing of swallow trigger inconsistent but most consistently triggers at the pyriform sinuses resulting in intermittent mild penetration of thin liquids, most of which squeezes back out during the swallow. Mild pharyngeal residue noted with solid textures that clears with multiple swallows and a liquid wash.  Recommend patient upgrade to thin liquids and continue Dys. 2 textures with full supervision to maximize safety and adherence to aspiration precautions.    Swallow Evaluation Recommendations       SLP Diet Recommendations: Dysphagia 2 (Fine chop) solids;Thin liquid   Liquid Administration via: Cup;Straw   Medication Administration: Crushed with puree   Supervision: Patient able to self feed;Full supervision/cueing for compensatory strategies   Compensations: Slow rate;Small sips/bites;Multiple dry swallows after each bite/sip;Minimize environmental distractions   Postural Changes: Seated upright at 90 degrees   Oral Care Recommendations: Oral care BID        Hazelene Doten 09/12/2018,2:09 PM  [place], [place], [place]

## 2018-09-13 ENCOUNTER — Inpatient Hospital Stay (HOSPITAL_COMMUNITY): Payer: BLUE CROSS/BLUE SHIELD | Admitting: Speech Pathology

## 2018-09-13 ENCOUNTER — Inpatient Hospital Stay (HOSPITAL_COMMUNITY): Payer: Self-pay

## 2018-09-13 ENCOUNTER — Inpatient Hospital Stay (HOSPITAL_COMMUNITY): Payer: BLUE CROSS/BLUE SHIELD

## 2018-09-13 DIAGNOSIS — M25511 Pain in right shoulder: Secondary | ICD-10-CM

## 2018-09-13 DIAGNOSIS — G8929 Other chronic pain: Secondary | ICD-10-CM

## 2018-09-13 DIAGNOSIS — M19011 Primary osteoarthritis, right shoulder: Secondary | ICD-10-CM

## 2018-09-13 MED ORDER — MUSCLE RUB 10-15 % EX CREA
TOPICAL_CREAM | Freq: Two times a day (BID) | CUTANEOUS | Status: DC | PRN
  Filled 2018-09-13: qty 85

## 2018-09-13 MED ORDER — BETAMETHASONE SOD PHOS & ACET 6 (3-3) MG/ML IJ SUSP
12.0000 mg | Freq: Once | INTRAMUSCULAR | Status: DC
  Filled 2018-09-13: qty 2

## 2018-09-13 MED ORDER — BUPIVACAINE HCL 0.5 % IJ SOLN
5.0000 mL | Freq: Once | INTRAMUSCULAR | Status: DC
  Filled 2018-09-13: qty 5

## 2018-09-13 MED ORDER — TROLAMINE SALICYLATE 10 % EX CREA
TOPICAL_CREAM | Freq: Two times a day (BID) | CUTANEOUS | Status: DC | PRN
  Filled 2018-09-13: qty 85

## 2018-09-13 NOTE — Progress Notes (Signed)
Speech Language Pathology Daily Session Note  Patient Details  Name: XZAVION DOSWELL MRN: [id number] Date of Birth: [birth year]  Today's Date: 09/13/2018 SLP Individual Time: 0915-1015 SLP Individual Time Calculation (min): 60 min  Short Term Goals: Week 2: SLP Short Term Goal 1 (Week 2): Patient will utilize speech intelligibility strategies to maximize intelligibility to ~75% at the phrase level with Min A verbal cues.  SLP Short Term Goal 2 (Week 2): Patient will demonstrate functional problem solving for basic and familiar tasks with Min A verbal cues.  SLP Short Term Goal 3 (Week 2): Patient will demonstrate selective attention to functional tasks for ~45 minutes with Supervision verbal cues for redirection.  SLP Short Term Goal 4 (Week 2): Patient will express basic wants/needs at the phrase level with Supervision multimodal cues.  SLP Short Term Goal 5 (Week 2): Patient will consume upgraded diet of Dys. 2 textures with thin liquids with minimal overt s/s of aspiration and supervision verbal cues for use of swallowing strategies.  SLP Short Term Goal 6 (Week 2): Patient will demonstrate efficient mastication and complete oral clearance with minimal overt s/s of aspiration with trials of Dys. 3 textures over 2 sessions prior to upgrade.   Skilled Therapeutic Interventions: Skilled treatment session focused on communication goals. SLP facilitated session by providing supervision verbal cues for word-finding while naming functional items and Min A question cues for word-finding at the sentence level during a verbal description task. Patient with intermittent perseveration and required extra time and Min A verbal cues to self-monitor and correct errors. Patient with increased speech intelligibility during session and was ~90% intelligible with Min A verbal cues. Patient requested to use the bathroom and transferred to the commode with +2 for safety. Patient left upright on commode with NT present.  Continue with current plan of care.      Pain No/Denies Pain   Therapy/Group: Individual Therapy  Thresea Doble 09/13/2018, 2:27 PM

## 2018-09-13 NOTE — Progress Notes (Signed)
Physical Therapy Weekly Progress Note  Patient Details  Name: Edward Glass MRN: [id number] Date of Birth: [birth year]  Beginning of progress report period: September 06, 2018 End of progress report period: September 13, 2018  Today's Date: 09/13/2018 PT Individual Time: 1300-1400 PT Individual Time Calculation (min): 60 min   Patient has met 4 of 4 short term goals.  Pt is progressing with functional mobility and is able to perform transfers with min assist and ambulated with mod assist. Pt continues to be limited by R hemiparesis, impaired R knee control during stance phase and decreased ability to weightshift and load R LE during gait.   Patient continues to demonstrate the following deficits muscle weakness, motor apraxia, decreased attention and decreased standing balance, decreased postural control, hemiplegia and decreased balance strategies and therefore will continue to benefit from skilled PT intervention to increase functional independence with mobility.  Patient progressing toward long term goals..  Continue plan of care.  PT Short Term Goals Week 1:  PT Short Term Goal 1 (Week 1): Pt will perform bed<>chair transfer with mod assist PT Short Term Goal 1 - Progress (Week 1): Met PT Short Term Goal 2 (Week 1): Pt sill perform sit<>stand with min assist PT Short Term Goal 2 - Progress (Week 1): Met PT Short Term Goal 3 (Week 1): Pt will propel w/c x 50 ft with supervision PT Short Term Goal 3 - Progress (Week 1): Met PT Short Term Goal 4 (Week 1): Pt will ambulate x 30 ft with LRAD and mod assist PT Short Term Goal 4 - Progress (Week 1): Met Week 2:  PT Short Term Goal 1 (Week 2): Pt will ambulated with LRAD x 50 ft and min assist PT Short Term Goal 2 (Week 2): Pt will perform bed<>chair transfers with CGA PT Short Term Goal 3 (Week 2): Pt will initiate stair training  Skilled Therapeutic Interventions/Progress Updates:    Pt seated in w/c upon PT arrival, agreeable to therapy  tx and denies pain. Pt transported to the gym. Pt performed stand pivot to the mat with min assist. Orthotist present this session for AFO recommendations. Pt ambulated 2 x 30 ft with RW and posterior leaf spring AFO with min assist, tactile cues for limiting R knee hyperextension, verbal cues for step length. Pt ambulated x 30 ft with AFO and heel wedge and RW, no improvement in patients ability to control knee hyperextension. Pt ambulated x 30 ft with RW, knee cage and R AFO, min assist with verbal cues for step length, knee cage providing tactile feedback to limit amount of hyperextension. Pt worked on R LE stance control with knee cage used for feedback to perform L LE stepping in place, L LE toe taps to 2 inch step, and L LE lateral side steps to 2 inch step, all without UE support and min assist. Pt ambulated x 45 ft without AD and mod assist, R AFO and knee cage used, increased knee hyperextension noted. Pt transported back to room and left with needs in reach and chair alarm set.   Therapy Documentation Precautions:  Precautions Precautions: Fall Precaution Comments: R hemiplegia; wrist in splint due to fx; shoulder pain Restrictions Weight Bearing Restrictions: No   Therapy/Group: Individual Therapy  Netta Corrigan, PT, DPT 09/13/2018, 1:02 PM

## 2018-09-13 NOTE — Progress Notes (Signed)
Orthopedic Tech Progress Note Patient Details:  Edward Glass 11/22/1955 [id number] Called in order  Patient ID: Edward Glass, male   DOB: 1955/11/18, 63 y.o.   MRN: [id number]   Janit Pagan 09/13/2018, 11:09 AM

## 2018-09-13 NOTE — Progress Notes (Addendum)
[facility] PHYSICAL MEDICINE & REHABILITATION PROGRESS NOTE   Subjective/Complaints:  No issues   ROS: limited due to language/communication   Objective:   Dg Shoulder Right  Result Date: 09/11/2018 CLINICAL DATA:  Right shoulder pain EXAM: RIGHT SHOULDER - 2+ VIEW COMPARISON:  None. FINDINGS: AC joint is intact. Moderate arthritis at the inferior glenohumeral joint with suspicion of multiple calcified loose bodies. No acute fracture or dislocation. IMPRESSION: Moderate arthritis of the glenohumeral joint with suspected multiple calcified loose bodies. Electronically Signed   By: Donavan Foil M.D.   On: 09/11/2018 20:06   Dg Swallowing Func-speech Pathology  Result Date: 09/12/2018 Objective Swallowing Evaluation: Type of Study: MBS-Modified Barium Swallow Study  Patient Details Name: Edward Glass MRN: [id number] Date of Birth: 09/14/1955 Today's Date: 09/12/2018 Time: SLP Start Time (ACUTE ONLY): 0910 -SLP Stop Time (ACUTE ONLY): 00940 SLP Time Calculation (min) (ACUTE ONLY): 30 min Past Medical History: Past Medical History: Diagnosis Date . Atypical chest pain 08/10/2013 . Elevated aspartate aminotransferase level 06/16/2015 . Excessive salivation 06/16/2015 . H/O nutritional disorder 06/16/2015 . Hypogonadism male 08/21/2014 . 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 Screening for prostate cancer 08/21/2014 . 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 HERNIA REPAIR  2006 . left tricep surgery   . LOOP RECORDER INSERTION N/A 09/04/2018  Procedure: LOOP RECORDER INSERTION;  Surgeon: Evans Lance, MD;  Location: [facility];  Service: Cardiovascular;  Laterality: N/A; . TEE WITHOUT CARDIOVERSION N/A 09/04/2018  Procedure: TRANSESOPHAGEAL ECHOCARDIOGRAM (TEE);  Surgeon: Jerline Pain, MD;  Location: [facility] ENDOSCOPY;  Service: Cardiovascular;  Laterality: N/A; HPI: Pt is a 63 y.o. male with medical history significant of hypogonadism, possible  BPH, who presents with confusion, difficulty speaking, right facial droop. 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 Pt demonstrated increased weakness on 08/31/18. A repeat CT of the head was conducted and it revealed a more confluent acute infarct in the left basal ganglia and corona radiata when compared to diffusion imaging.  Subjective: alert and cooperative but also aphasic Assessment / Plan / Recommendation CHL IP CLINICAL IMPRESSIONS 09/12/2018 Clinical Impression Patient continues to demonstrate a mild oropharyngeal dysphagia. Oral phase is characterized by reduce bolus cohesion and containment and incomplete mastication of solids prior to them spilling into his pharynx.  Timing of swallow trigger inconsistent but most consistently triggers at the pyriform sinuses resulting in intermittent mild penetration of thin liquids, most of which squeezes back out during the swallow. Mild pharyngeal residue noted with solid textures that clears with multiple swallows and a liquid wash.  Recommend patient upgrade to thin liquids and continue Dys. 2 textures with full supervision to maximize safety and adherence to aspiration precautions.  SLP Visit Diagnosis Dysphagia, oropharyngeal phase (R13.12) Attention and concentration deficit following -- Frontal lobe and executive function deficit following -- Impact on safety and function Mild aspiration risk   CHL IP TREATMENT RECOMMENDATION 09/12/2018 Treatment Recommendations Therapy as outlined in treatment plan below   Prognosis 09/12/2018 Prognosis for Safe Diet Advancement Good Barriers to Reach Goals -- Barriers/Prognosis Comment -- CHL IP DIET RECOMMENDATION 09/12/2018 SLP Diet Recommendations Dysphagia 2 (Fine chop) solids;Thin liquid Liquid Administration via Cup;Straw Medication  Administration Crushed with puree Compensations Slow rate;Small sips/bites;Multiple dry swallows after each bite/sip;Minimize environmental distractions Postural Changes Seated upright at 90 degrees  CHL IP OTHER RECOMMENDATIONS 09/12/2018 Recommended Consults -- Oral Care Recommendations Oral care BID Other Recommendations --   CHL IP FOLLOW UP RECOMMENDATIONS 09/12/2018 Follow up Recommendations Inpatient Rehab   CHL IP FREQUENCY AND DURATION 09/12/2018 Speech Therapy Frequency (ACUTE ONLY) min 3x week Treatment Duration 2 weeks      CHL IP ORAL PHASE 09/12/2018 Oral Phase Impaired Oral - Pudding Teaspoon -- Oral - Pudding Cup -- Oral - Honey Teaspoon -- Oral - Honey Cup -- Oral - Nectar Teaspoon -- Oral - Nectar Cup NT Oral - Nectar Straw NT Oral - Thin Teaspoon Decreased bolus cohesion Oral - Thin Cup Decreased bolus cohesion Oral - Thin Straw Decreased bolus cohesion Oral - Puree Decreased bolus cohesion;Delayed oral transit Oral - Mech Soft Decreased bolus cohesion;Impaired mastication Oral - Regular -- Oral - Multi-Consistency -- Oral - Pill -- Oral Phase - Comment --  CHL IP PHARYNGEAL PHASE 09/12/2018 Pharyngeal Phase Impaired Pharyngeal- Pudding Teaspoon -- Pharyngeal -- Pharyngeal- Pudding Cup -- Pharyngeal -- Pharyngeal- Honey Teaspoon -- Pharyngeal -- Pharyngeal- Honey Cup -- Pharyngeal -- Pharyngeal- Nectar Teaspoon -- Pharyngeal -- Pharyngeal- Nectar Cup NT Pharyngeal -- Pharyngeal- Nectar Straw NT Pharyngeal -- Pharyngeal- Thin Teaspoon Delayed swallow initiation-pyriform sinuses Pharyngeal -- Pharyngeal- Thin Cup Delayed swallow initiation-pyriform sinuses Pharyngeal Material enters airway, remains ABOVE vocal cords then ejected out Pharyngeal- Thin Straw Delayed swallow initiation-pyriform sinuses Pharyngeal Material enters airway, CONTACTS cords and then ejected out Pharyngeal- Puree Delayed swallow initiation-pyriform sinuses Pharyngeal -- Pharyngeal- Mechanical Soft Delayed swallow  initiation-pyriform sinuses Pharyngeal -- Pharyngeal- Regular -- Pharyngeal -- Pharyngeal- Multi-consistency -- Pharyngeal -- Pharyngeal- Pill -- Pharyngeal -- Pharyngeal Comment --  CHL IP CERVICAL ESOPHAGEAL PHASE 09/02/2018 Cervical Esophageal Phase Impaired Pudding Teaspoon -- Pudding Cup -- Honey Teaspoon -- Honey Cup -- Nectar Teaspoon -- Nectar Cup Reduced cricopharyngeal relaxation Nectar Straw Reduced cricopharyngeal relaxation Thin Teaspoon -- Thin Cup Reduced cricopharyngeal relaxation Thin Straw Reduced cricopharyngeal relaxation Puree Reduced cricopharyngeal relaxation Mechanical Soft Reduced cricopharyngeal relaxation Regular -- Multi-consistency -- Pill -- Cervical Esophageal Comment -- PAYNE, COURTNEY 09/12/2018, 2:10 PM    Weston Anna, MA, CCC-SLP [phone]           No results for input(s): WBC, HGB, HCT, PLT in the last 72 hours. No results for input(s): NA, K, CL, CO2, GLUCOSE, BUN, CREATININE, CALCIUM in the last 72 hours.  Intake/Output Summary (Last 24 hours) at 09/13/2018 0813 Last data filed at 09/12/2018 1721 Gross per 24 hour  Intake 1080 ml  Output 400 ml  Net 680 ml     Physical Exam: Vital Signs Blood pressure 115/67, pulse (!) 54, temperature 98 F (36.7 C), temperature source Oral, resp. rate 16, height 5' 5"  (1.651 m), weight 74.7 kg, SpO2 96 %.   Constitutional: No distress . Vital signs reviewed. HEENT: EOMI, oral membranes moist Neck: supple Cardiovascular: RRR without murmur. No JVD    Respiratory: CTA Bilaterally without wheezes or rales. Normal effort    GI: BS +, non-tender, non-distended  Extremities: No clubbing, cyanosis, or edema Skin: No evidence of breakdown, no evidence of rash Neurologic: Cranial nerves II through XII intact, motor strength is 4/5 in Left deltoid, bicep, tricep, grip, hip flexor, knee extensors, ankle dorsiflexor and plantar flexor, very dysarthric speech 1/5 R finger flexion and biceps-- 2- hip adduction, 3- knee ext, 0/5 in  RIght ankle No resting tone Senses pain in right arm and leg Cerebellar exam unable to perform on RIght due to weakness Musculoskeletal:pain with RIght shoujder abd,  pain over R AC joint  Psych: pleasant, cooperative  Assessment/Plan: 1. Functional deficits secondary to RIght hemiparesis and aphasia which require 3+ hours per day of interdisciplinary therapy in a comprehensive inpatient rehab setting.  Physiatrist is providing close team supervision and 24 hour management of active medical problems listed below.  Physiatrist and rehab team continue to assess barriers to discharge/monitor patient progress toward functional and medical goals  Care Tool:  Bathing    Body parts bathed by patient: Right arm, Chest, Abdomen, Front perineal area, Right upper leg, Left upper leg, Face   Body parts bathed by helper: Left arm, Buttocks, Right lower leg, Left lower leg     Bathing assist Assist Level: Moderate Assistance - Patient 50 - 74%     Upper Body Dressing/Undressing Upper body dressing   What is the patient wearing?: Pull over shirt    Upper body assist Assist Level: Minimal Assistance - Patient > 75%    Lower Body Dressing/Undressing Lower body dressing      What is the patient wearing?: Underwear/pull up, Pants     Lower body assist Assist for lower body dressing: Moderate Assistance - Patient 50 - 74%     Toileting Toileting    Toileting assist Assist for toileting: Total Assistance - Patient < 25%     Transfers Chair/bed transfer  Transfers assist     Chair/bed transfer assist level: Minimal Assistance - Patient > 75%     Locomotion Ambulation   Ambulation assist   Ambulation activity did not occur: Safety/medical concerns  Assist level: 2 helpers Assistive device: Lite Gait Max distance: 191 ft   Walk 10 feet activity   Assist     Assist level: 2 helpers Assistive device: Lite Gait   Walk 50 feet activity   Assist Walk 50 feet with 2  turns activity did not occur: Safety/medical concerns  Assist level: 2 helpers Assistive device: Lite Gait    Walk 150 feet activity   Assist Walk 150 feet activity did not occur: Safety/medical concerns  Assist level: 2 helpers Assistive device: Lite Gait    Walk 10 feet on uneven surface  activity   Assist Walk 10 feet on uneven surfaces activity did not occur: Safety/medical concerns         Wheelchair     Assist Will patient use wheelchair at discharge?: Yes Type of Wheelchair: Manual    Wheelchair assist level: Minimal Assistance - Patient > 75% Max wheelchair distance: 100'    Wheelchair 50 feet with 2 turns activity    Assist    Wheelchair 50 feet with 2 turns activity did not occur: Safety/medical concerns   Assist Level: Minimal Assistance - Patient > 75%   Wheelchair 150 feet activity     Assist Wheelchair 150 feet activity did not occur: Safety/medical concerns         Medical Problem List and Plan: 1.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 Formulized individual treatment plan based on medical history, underlying problem and comorbidities.  -Continue splinting and range of motion exercises 2. DVT Prophylaxis/Anticoagulation: Venous Dopplers lower extremity negative.Subcutaneous Lovenox for DVT prophylaxis 3. Pain Management:Tylenol as needed. No pain at present Glenohumeral osteoarthritis  with probable loose cartilage in joint , as discussed this may require orthopedic eval in future but need to avoid elective surgery for ~90mo may do corticosteroid injection   4. Mood:Prozac 20 mg daily. Provide ego support , discussed recovery process and that it is not always linear but may come in spurts, , pt feels discourged , ask neuropsych to see 5. Neuropsych: This patientiscapable of making decisions on hisown behalf. 6. Skin/Wound Care:Routine skin checks 7. Fluids/Electrolytes/Nutrition:Routine in and out's with follow-up chemistries I [id number] 2/16- enc fluids 8. Dysphagia. Dysphagia #2 THIN liquids- BMET normal, tolerating current diet-  MBS result reviewed 9. Hyperlipidemia. Lipitor 10. Permissive hypertension. Patient on no antihypertensive medications on admission Vitals:   09/12/18 1940 09/13/18 0627  BP: 121/64 115/67  Pulse: (!) 59 (!) 54  Resp: 18 16  Temp: 98.1 F (36.7 C) 98 F (36.7 C)  SpO2: 97% 96%  -good control 2/18 11. BPH. Uroxatral 10 mg 3 times a day once per day on Monday Wednesday Friday. Check PVRs 12. Possible right wrist fracture/Triquetrum.. Conservative care. Follow-up orthopedic service Dr. RoStann MainlandWeightbearing as tolerated with splint in place, will order Xray in ~1wk - no pain with gentle ROM of RIght wrist  13.  Constipation unresponsive to supp or oral laxative,  Fleets without results  -Patient received sorbitol, refused soapsuds enema last night but will take this morning.  BM x 2 on 2/16  Shoulder injection Right subacromial   Indication:Right Shoulder pain not relieved by medication management and other conservative care.  Informed consent was obtained after describing risks and benefits of the procedure with the patient, this includes bleeding, bruising, infection and medication side effects. The patient wishes to proceed and has given written consent. Patient was placed in a seated position. The RIght shoulder was marked and prepped with betadine in the subacromial area. A 25-gauge 1-1/2 inch needle was inserted into the subacromial area. After negative draw back for blood, a solution  containing 1 mL of 6 mg per ML betamethasone and 4 mL of 1% lidocaine was injected. A band aid was applied. The patient tolerated the procedure well. Post procedure instructions were given. LOS: 8 days A FACE TO FACE EVALUATION WAS PERFORMED  AnCharlett Blake/19/2020, 8:13 AM

## 2018-09-13 NOTE — Progress Notes (Signed)
Occupational Therapy Session Note  Patient Details  Name: Edward Glass MRN: [id number] Date of Birth: [birth year]  Today's Date: 09/13/2018 OT Individual Time: 1103-1594 OT Individual Time Calculation (min): 75 min    Short Term Goals: Week 2:  OT Short Term Goal 1 (Week 2): Pt will thread BLE into pants with min A in preparation for standing to pull pants over hips OT Short Term Goal 2 (Week 2): Pt will don socks with min A OT Short Term Goal 3 (Week 2): Pt will perform toileting tasks with mod A  Skilled Therapeutic Interventions/Progress Updates:    OT intervention with focus on BADL retraining, bed mobility, sit<>stand, functional tranfsers, standing balance, activity tolerance, and safety awareness to increase independence with BADLs.  Pt performed supine>sit EOB and stand pivot transfer to w/c with min A.  Pt completed bathing/dressing tasks per below.  Pt continues to c/o shoulder pain and has significant tone in shoulder limiting functional movement. Pt with trace finger flexion when wrist splint removed. Pt is able to stand with min A/CGA when pulling pant over hips but continues to require max A for toileting tasks.  Pt completes self feeding with setup and min verbal cues for rate and portion management.  Pt remained seated in w/c with all needs within reach and belt alarm activated.   Therapy Documentation Precautions:  Precautions Precautions: Fall Precaution Comments: R hemiplegia; wrist in splint due to fx; shoulder pain Restrictions Weight Bearing Restrictions: No Pain:  Pt c/o 7/10 R shoulder pain with movement; RN Marzetta Board and MD Kirstein aware; repositioned ADL: ADL Eating: Supervision/safety, Minimal cueing Where Assessed-Eating: Wheelchair Grooming: Supervision/safety Where Assessed-Grooming: Sitting at sink Upper Body Bathing: Minimal assistance Where Assessed-Upper Body Bathing: Shower Lower Body Bathing: Moderate assistance Where Assessed-Lower Body Bathing:  Shower Upper Body Dressing: Moderate cueing, Minimal assistance Where Assessed-Upper Body Dressing: Wheelchair Lower Body Dressing: Moderate assistance, Moderate cueing Where Assessed-Lower Body Dressing: Wheelchair, Standing at sink Toileting: Maximal assistance Where Assessed-Toileting: Glass blower/designer: Psychiatric nurse Method: Arts development officer: Energy manager: Minimal assistance, Moderate cueing Social research officer, government Method: Radiographer, therapeutic: Grab bars, Shower seat without back, Transfer tub bench  Therapy/Group: Individual Therapy  [name] 09/13/2018, 8:39 AM

## 2018-09-14 ENCOUNTER — Inpatient Hospital Stay (HOSPITAL_COMMUNITY): Payer: BLUE CROSS/BLUE SHIELD | Admitting: Physical Therapy

## 2018-09-14 ENCOUNTER — Ambulatory Visit: Payer: BLUE CROSS/BLUE SHIELD

## 2018-09-14 ENCOUNTER — Inpatient Hospital Stay (HOSPITAL_COMMUNITY): Payer: BLUE CROSS/BLUE SHIELD

## 2018-09-14 ENCOUNTER — Inpatient Hospital Stay (HOSPITAL_COMMUNITY): Payer: BLUE CROSS/BLUE SHIELD | Admitting: Speech Pathology

## 2018-09-14 NOTE — Progress Notes (Signed)
[place] PHYSICAL MEDICINE & REHABILITATION PROGRESS NOTE   Subjective/Complaints:  Appreciate SLP note- D2 thin Patient is very happy about getting regular coffee today Shoulder pain improved on the right side  ROS: limited due to language/communication   Objective:   Dg Swallowing Func-speech Pathology  Result Date: 09/12/2018 Objective Swallowing Evaluation: Type of Study: MBS-Modified Barium Swallow Study  Patient Details Name: Edward Glass MRN: [id number] Date of Birth: Jun 24, 1956 Today's Date: 09/12/2018 Time: SLP Start Time (ACUTE ONLY): 0910 -SLP Stop Time (ACUTE ONLY): 00940 SLP Time Calculation (min) (ACUTE ONLY): 30 min Past Medical History: Past Medical History: Diagnosis Date . Atypical chest pain 08/10/2013 . Elevated aspartate aminotransferase level 06/16/2015 . Excessive salivation 06/16/2015 . H/O nutritional disorder 06/16/2015 . Hypogonadism male 08/21/2014 . 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 Screening for prostate cancer 08/21/2014 . 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 HERNIA REPAIR  2006 . left tricep surgery   . LOOP RECORDER INSERTION N/A 09/04/2018  Procedure: LOOP RECORDER INSERTION;  Surgeon: Evans Lance, MD;  Location: [facility];  Service: Cardiovascular;  Laterality: N/A; . TEE WITHOUT CARDIOVERSION N/A 09/04/2018  Procedure: TRANSESOPHAGEAL ECHOCARDIOGRAM (TEE);  Surgeon: Jerline Pain, MD;  Location: [facility] ENDOSCOPY;  Service: Cardiovascular;  Laterality: N/A; HPI: Pt is a 63 y.o. male with medical history significant of hypogonadism, possible BPH, who presents with confusion, difficulty speaking, right facial droop. 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 Pt demonstrated increased weakness on 08/31/18. A repeat CT of  the head was conducted and it revealed a more confluent acute infarct in the left basal ganglia and corona radiata when compared to diffusion imaging.  Subjective: alert and cooperative but also aphasic Assessment / Plan / Recommendation CHL IP CLINICAL IMPRESSIONS 09/12/2018 Clinical Impression Patient continues to demonstrate a mild oropharyngeal dysphagia. Oral phase is characterized by reduce bolus cohesion and containment and incomplete mastication of solids prior to them spilling into his pharynx.  Timing of swallow trigger inconsistent but most consistently triggers at the pyriform sinuses resulting in intermittent mild penetration of thin liquids, most of which squeezes back out during the swallow. Mild pharyngeal residue noted with solid textures that clears with multiple swallows and a liquid wash.  Recommend patient upgrade to thin liquids and continue Dys. 2 textures with full supervision to maximize safety and adherence to aspiration precautions.  SLP Visit Diagnosis Dysphagia, oropharyngeal phase (R13.12) Attention and concentration deficit following -- Frontal lobe and executive function deficit following -- Impact on safety and function Mild aspiration risk   CHL IP TREATMENT RECOMMENDATION 09/12/2018 Treatment Recommendations Therapy as outlined in treatment plan below   Prognosis 09/12/2018 Prognosis for Safe Diet Advancement Good Barriers to Reach Goals -- Barriers/Prognosis Comment -- CHL IP DIET RECOMMENDATION 09/12/2018 SLP Diet Recommendations Dysphagia 2 (Fine chop) solids;Thin liquid Liquid Administration via Cup;Straw Medication Administration Crushed with puree Compensations Slow rate;Small sips/bites;Multiple dry swallows after each bite/sip;Minimize environmental distractions Postural Changes Seated upright at 90 degrees   CHL IP OTHER RECOMMENDATIONS 09/12/2018 Recommended Consults -- Oral Care Recommendations Oral care BID Other Recommendations --   CHL IP FOLLOW UP RECOMMENDATIONS  09/12/2018 Follow up Recommendations Inpatient Rehab   CHL IP FREQUENCY AND DURATION 09/12/2018 Speech Therapy Frequency (ACUTE ONLY) min 3x week Treatment Duration 2 weeks      CHL IP  ORAL PHASE 09/12/2018 Oral Phase Impaired Oral - Pudding Teaspoon -- Oral - Pudding Cup -- Oral - Honey Teaspoon -- Oral - Honey Cup -- Oral - Nectar Teaspoon -- Oral - Nectar Cup NT Oral - Nectar Straw NT Oral - Thin Teaspoon Decreased bolus cohesion Oral - Thin Cup Decreased bolus cohesion Oral - Thin Straw Decreased bolus cohesion Oral - Puree Decreased bolus cohesion;Delayed oral transit Oral - Mech Soft Decreased bolus cohesion;Impaired mastication Oral - Regular -- Oral - Multi-Consistency -- Oral - Pill -- Oral Phase - Comment --  CHL IP PHARYNGEAL PHASE 09/12/2018 Pharyngeal Phase Impaired Pharyngeal- Pudding Teaspoon -- Pharyngeal -- Pharyngeal- Pudding Cup -- Pharyngeal -- Pharyngeal- Honey Teaspoon -- Pharyngeal -- Pharyngeal- Honey Cup -- Pharyngeal -- Pharyngeal- Nectar Teaspoon -- Pharyngeal -- Pharyngeal- Nectar Cup NT Pharyngeal -- Pharyngeal- Nectar Straw NT Pharyngeal -- Pharyngeal- Thin Teaspoon Delayed swallow initiation-pyriform sinuses Pharyngeal -- Pharyngeal- Thin Cup Delayed swallow initiation-pyriform sinuses Pharyngeal Material enters airway, remains ABOVE vocal cords then ejected out Pharyngeal- Thin Straw Delayed swallow initiation-pyriform sinuses Pharyngeal Material enters airway, CONTACTS cords and then ejected out Pharyngeal- Puree Delayed swallow initiation-pyriform sinuses Pharyngeal -- Pharyngeal- Mechanical Soft Delayed swallow initiation-pyriform sinuses Pharyngeal -- Pharyngeal- Regular -- Pharyngeal -- Pharyngeal- Multi-consistency -- Pharyngeal -- Pharyngeal- Pill -- Pharyngeal -- Pharyngeal Comment --  CHL IP CERVICAL ESOPHAGEAL PHASE 09/02/2018 Cervical Esophageal Phase Impaired Pudding Teaspoon -- Pudding Cup -- Honey Teaspoon -- Honey Cup -- Nectar Teaspoon -- Nectar Cup Reduced cricopharyngeal  relaxation Nectar Straw Reduced cricopharyngeal relaxation Thin Teaspoon -- Thin Cup Reduced cricopharyngeal relaxation Thin Straw Reduced cricopharyngeal relaxation Puree Reduced cricopharyngeal relaxation Mechanical Soft Reduced cricopharyngeal relaxation Regular -- Multi-consistency -- Pill -- Cervical Esophageal Comment -- PAYNE, COURTNEY 09/12/2018, 2:10 PM    Weston Anna, MA, CCC-SLP [phone]           No results for input(s): WBC, HGB, HCT, PLT in the last 72 hours. No results for input(s): NA, K, CL, CO2, GLUCOSE, BUN, CREATININE, CALCIUM in the last 72 hours.  Intake/Output Summary (Last 24 hours) at 09/14/2018 0857 Last data filed at 09/14/2018 0814 Gross per 24 hour  Intake 720 ml  Output 525 ml  Net 195 ml     Physical Exam: Vital Signs Blood pressure 129/76, pulse 60, temperature 98 F (36.7 C), temperature source Oral, resp. rate 18, height 5\' 5"  (1.651 m), weight 74.7 kg, SpO2 97 %.   Constitutional: No distress . Vital signs reviewed. HEENT: EOMI, oral membranes moist Neck: supple Cardiovascular: RRR without murmur. No JVD    Respiratory: CTA Bilaterally without wheezes or rales. Normal effort    GI: BS +, non-tender, non-distended  Extremities: No clubbing, cyanosis, or edema Skin: No evidence of breakdown, no evidence of rash Neurologic: Cranial nerves II through XII intact, motor strength is 4/5 in Left deltoid, bicep, tricep, grip, hip flexor, knee extensors, ankle dorsiflexor and plantar flexor, very dysarthric speech 2-/5 R finger flexion and biceps-- 2- hip adduction, 3- knee ext, 0/5 in RIght ankle No resting tone Senses pain in right arm and leg Cerebellar exam unable to perform on RIght due to weakness Musculoskeletal:pain with RIght shoujder abd, pain over R AC joint  Psych: pleasant, cooperative  Assessment/Plan: 1. Functional deficits secondary to RIght hemiparesis and aphasia which require 3+ hours per day of interdisciplinary therapy in a  comprehensive inpatient rehab setting.  Physiatrist is providing close team supervision and 24 hour management of active medical problems listed below.  Physiatrist and rehab team continue  to assess barriers to discharge/monitor patient progress toward functional and medical goals  Care Tool:  Bathing    Body parts bathed by patient: Right arm, Chest, Abdomen, Front perineal area, Right upper leg, Left upper leg, Face   Body parts bathed by helper: Left arm, Buttocks, Right lower leg, Left lower leg     Bathing assist Assist Level: Moderate Assistance - Patient 50 - 74%     Upper Body Dressing/Undressing Upper body dressing   What is the patient wearing?: Pull over shirt    Upper body assist Assist Level: Minimal Assistance - Patient > 75%    Lower Body Dressing/Undressing Lower body dressing      What is the patient wearing?: Underwear/pull up, Pants     Lower body assist Assist for lower body dressing: Moderate Assistance - Patient 50 - 74%     Toileting Toileting    Toileting assist Assist for toileting: Total Assistance - Patient < 25%     Transfers Chair/bed transfer  Transfers assist     Chair/bed transfer assist level: Minimal Assistance - Patient > 75%     Locomotion Ambulation   Ambulation assist   Ambulation activity did not occur: Safety/medical concerns  Assist level: Moderate Assistance - Patient 50 - 74% Assistive device: (no device) Max distance: 45 ft   Walk 10 feet activity   Assist     Assist level: Moderate Assistance - Patient - 50 - 74% Assistive device: (none)   Walk 50 feet activity   Assist Walk 50 feet with 2 turns activity did not occur: Safety/medical concerns  Assist level: 2 helpers Assistive device: Lite Gait    Walk 150 feet activity   Assist Walk 150 feet activity did not occur: Safety/medical concerns  Assist level: 2 helpers Assistive device: Lite Gait    Walk 10 feet on uneven surface   activity   Assist Walk 10 feet on uneven surfaces activity did not occur: Safety/medical concerns         Wheelchair     Assist Will patient use wheelchair at discharge?: Yes Type of Wheelchair: Manual    Wheelchair assist level: Minimal Assistance - Patient > 75% Max wheelchair distance: 100'    Wheelchair 50 feet with 2 turns activity    Assist    Wheelchair 50 feet with 2 turns activity did not occur: Safety/medical concerns   Assist Level: Minimal Assistance - Patient > 75%   Wheelchair 150 feet activity     Assist Wheelchair 150 feet activity did not occur: Safety/medical concerns         Medical Problem List and Plan: 1.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 DVT Prophylaxis/Anticoagulation: Venous Dopplers lower extremity negative.Subcutaneous Lovenox for DVT prophylaxis 3. Pain Management:Tylenol as needed. No pain at present Glenohumeral osteoarthritis with probable loose cartilage in joint , improved after corticosteroid injection  4. Mood:Prozac 20 mg daily. Provide ego support , discussed recovery process and that it is not always linear but may come in spurts, , pt feels discourged , ask neuropsych to see 5. Neuropsych: This patientiscapable of making decisions on hisown behalf. 6. Skin/Wound Care:Routine skin checks 7. Fluids/Electrolytes/Nutrition:Routine in and out's with follow-up chemistries I 479ml on 2/16- enc fluids 8. Dysphagia. Dysphagia #2 THIN  liquids- BMET normal, tolerating current diet-  MBS result reviewed, tolerating thin liquids 9. Hyperlipidemia. Lipitor 10. Permissive hypertension. Patient on no  antihypertensive medications on admission Vitals:   09/13/18 1929 09/14/18 0601  BP: 128/77 129/76  Pulse: 66 60  Resp: 18 18  Temp: 98 F (36.7 C) 98 F (36.7 C)  SpO2: 92% 97%  -good control 2/18 11. BPH. Uroxatral 10 mg 3 times a day once per day on Monday Wednesday Friday. Check PVRs 12. Possible right wrist fracture/Triquetrum.. Conservative care. Follow-up orthopedic service Dr. Stann Mainland. Weightbearing as tolerated with splint in place, will order Xray in ~1wk - no pain with gentle ROM of RIght wrist  13.  Constipation unresponsive to supp or oral laxative,  Fleets without results  -Patient received sorbitol, refused soapsuds enema last night but will take this morning.   LOS: 9 days A FACE TO FACE EVALUATION WAS PERFORMED  [name] 09/14/2018, 8:57 AM

## 2018-09-14 NOTE — Progress Notes (Signed)
Speech Language Pathology Daily Session Note  Patient Details  Name: TRAVARIS KOSH MRN: [id number] Date of Birth: 01-30-1956  Today's Date: 09/14/2018 SLP Individual Time: 1000-1055 SLP Individual Time Calculation (min): 55 min  Short Term Goals: Week 2: SLP Short Term Goal 1 (Week 2): Patient will utilize speech intelligibility strategies to maximize intelligibility to ~75% at the phrase level with Min A verbal cues.  SLP Short Term Goal 2 (Week 2): Patient will demonstrate functional problem solving for basic and familiar tasks with Min A verbal cues.  SLP Short Term Goal 3 (Week 2): Patient will demonstrate selective attention to functional tasks for ~45 minutes with Supervision verbal cues for redirection.  SLP Short Term Goal 4 (Week 2): Patient will express basic wants/needs at the phrase level with Supervision multimodal cues.  SLP Short Term Goal 5 (Week 2): Patient will consume upgraded diet of Dys. 2 textures with thin liquids with minimal overt s/s of aspiration and supervision verbal cues for use of swallowing strategies.  SLP Short Term Goal 6 (Week 2): Patient will demonstrate efficient mastication and complete oral clearance with minimal overt s/s of aspiration with trials of Dys. 3 textures over 2 sessions prior to upgrade.   Skilled Therapeutic Interventions: Skilled treatment session focused on cognitive goals. SLP facilitated session by initially providing Mod A verbal cues for complex problem solving during a novel card task. However, only supervision level verbal cues were required by end of task. SLP also provided Min A verbal cues for recall of procedures throughout task. Patient with increased vocal intensity this session and required overall Min A verbal cues to achieve ~90% intelligibility at the phrase and sentence level. Patient left upright in wheelchair with alarm on and all needs within reach. Continue with current plan of care.      Pain No/Denies Pain    Therapy/Group: Individual Therapy  Jaylenn Altier 09/14/2018, 12:37 PM

## 2018-09-14 NOTE — Progress Notes (Signed)
Physical Therapy Session Note  Patient Details  Name: Edward Glass MRN: [id number] Date of Birth: 01/31/1956  Today's Date: 09/14/2018 PT Individual Time: 1600-1655 PT Individual Time Calculation (min): 55 min   Short Term Goals: Week 2:  PT Short Term Goal 1 (Week 2): Pt will ambulated with LRAD x 50 ft and min assist PT Short Term Goal 2 (Week 2): Pt will perform bed<>chair transfers with CGA PT Short Term Goal 3 (Week 2): Pt will initiate stair training  Skilled Therapeutic Interventions/Progress Updates:   Pt in supine and agreeable to therapy, no c/o pain throughout session. Min assist supine>sit and min assist transfer to w/c. Total assist w/c transport to therapy gym. Session focused on [name] NMR w/ emphasis on decreasing R knee hyperextension in stance during gait. Ambulated 30' w/ min assist using RW and RAFO. R knee extension thrust during R single leg stance 100% of the time. [name] step ups to 2" step and L forward and backward stepping to visual target. Max tactile and manual cues to prevent hyperextension during R loading/weight acceptance w/ all tasks, cues faded to min assist w/ repetition. Ambulated 30' x2 w/ RW and RAFO. Able to maintain slight knee flexion during R single leg stance ~25% of the time. Ambulated w/o AD, 20' x2 w/ plank in between LEs to promote increased BOS and to provide tactile cues for increasing hip abduction. Verbal and tactile cues throughout to maintain slight R knee flexion in stance. Seated rest breaks throughout session 2/2 fatigue. Pt self-propelled w/c back to room via L hemi technique. Ended session in w/c and in care of family, all needs met.   Therapy Documentation Precautions:  Precautions Precautions: Fall Precaution Comments: R hemiplegia; wrist in splint due to fx; shoulder pain Restrictions Weight Bearing Restrictions: No Vital Signs: Therapy Vitals Temp: 98 F (36.7 C) Pulse Rate: 61 Resp: 19 BP: 116/64 Patient Position (if  appropriate): Lying Oxygen Therapy SpO2: 97 % O2 Device: Room Air  Therapy/Group: Individual Therapy   K  09/14/2018, 5:32 PM  

## 2018-09-14 NOTE — Progress Notes (Signed)
Occupational Therapy Session Note  Patient Details  Name: Edward Glass MRN: [id number] Date of Birth: 05/05/1956  Today's Date: 09/14/2018 OT Individual Time: 0700-0812 OT Individual Time Calculation (min): 72 min    Short Term Goals: Week 2:  OT Short Term Goal 1 (Week 2): Pt will thread BLE into pants with min A in preparation for standing to pull pants over hips OT Short Term Goal 2 (Week 2): Pt will don socks with min A OT Short Term Goal 3 (Week 2): Pt will perform toileting tasks with mod A  Skilled Therapeutic Interventions/Progress Updates:    OT intervention with focus on BADL retraining, functional transfers, sit<>stand, standing balance, and activity tolerance to increase independence with BADLs. See below. Pt encouraged to push through BLE for sit<>stand vs pulling up on grab bars in shower.  Pt required min A for sit<>stand with this technique.  Pt requires min A/CGA for standing when pulling pants over hips.  Pt continues to require max verbal cues for hemi dressing techniques. Pt required set up for eating breakfast with min verbal cues for rate and portion management.  Pt remained seated in w/c with all needs within reach and belt alarm activated.   Therapy Documentation Precautions:  Precautions Precautions: Fall Precaution Comments: R hemiplegia; wrist in splint due to fx; shoulder pain Restrictions Weight Bearing Restrictions: No Pain:  Pt c/o R shoulder pain/tightness (unrated) but improved from yesterday; RN Erline Levine aware and repositioned ADL: ADL Eating: Supervision/safety, Minimal cueing Where Assessed-Eating: Wheelchair Grooming: Supervision/safety Where Assessed-Grooming: Sitting at sink Upper Body Bathing: Minimal assistance Where Assessed-Upper Body Bathing: Shower Lower Body Bathing: Moderate assistance Where Assessed-Lower Body Bathing: Shower Upper Body Dressing: Moderate cueing, Minimal assistance Where Assessed-Upper Body Dressing:  Wheelchair Lower Body Dressing: Moderate assistance, Moderate cueing Where Assessed-Lower Body Dressing: Wheelchair, Standing at sink Toileting: Maximal assistance Where Assessed-Toileting: Glass blower/designer: Psychiatric nurse Method: Arts development officer: Energy manager: Minimal assistance, Moderate cueing Social research officer, government Method: Radiographer, therapeutic: Grab bars, Shower seat without back, Transfer tub bench   Therapy/Group: Individual Therapy  [name] 09/14/2018, 8:13 AM

## 2018-09-15 ENCOUNTER — Inpatient Hospital Stay (HOSPITAL_COMMUNITY): Payer: BLUE CROSS/BLUE SHIELD | Admitting: Physical Therapy

## 2018-09-15 ENCOUNTER — Inpatient Hospital Stay (HOSPITAL_COMMUNITY): Payer: BLUE CROSS/BLUE SHIELD

## 2018-09-15 ENCOUNTER — Inpatient Hospital Stay (HOSPITAL_COMMUNITY): Payer: BLUE CROSS/BLUE SHIELD | Admitting: Occupational Therapy

## 2018-09-15 ENCOUNTER — Inpatient Hospital Stay (HOSPITAL_COMMUNITY): Payer: BLUE CROSS/BLUE SHIELD | Admitting: Speech Pathology

## 2018-09-15 NOTE — Progress Notes (Signed)
Social Work Patient ID: Edward Glass, male   DOB: 06-Aug-1955, 62 y.o.   MRN: [id number]   CSW met with pt after team conference on 09-13-18 and spoke with dtr, [name], today to update them on team conference discussion and targeted d/c date of 09-29-18.  Dtr confirmed that pt will come to her home at d/c and she will come for family education closer to 09-29-18.  She has seen progress in pt already and feels he is coping well.  CSW will continue to follow and assist as needed.

## 2018-09-15 NOTE — Progress Notes (Signed)
Occupational Therapy Session Note  Patient Details  Name: Edward Glass MRN: [id number] Date of Birth: [birth year]  Today's Date: 09/15/2018 OT Individual Time: 0915-1000 OT Individual Time Calculation (min): 45 min    Short Term Goals: Week 2:  OT Short Term Goal 1 (Week 2): Pt will thread BLE into pants with min A in preparation for standing to pull pants over hips OT Short Term Goal 2 (Week 2): Pt will don socks with min A OT Short Term Goal 3 (Week 2): Pt will perform toileting tasks with mod A  Skilled Therapeutic Interventions/Progress Updates:    Pt received sitting on toilet with Stedy in front of pt.   He sat for a few minutes but did not need to toilet. Pt came to stand in [place] with min A and with support pt was able to use L hand to pull pants over hips.   Using the stedy, Worked on LE exercises with stand to semi squat 20 x progressing from using L hand for support on stedy with close S to the challenge of no L hand support with min A.   Standing in the stedy, pt attempted heel raises with slight activation of R calf.  Standing weight shifts R >< L.    Pt positioned at bed and he worked on stand to full sit on bed using stedy with L hand 10x with CGA and then no UE support with mod A.    Sitting on EOB, R arm placed on tray table ontop of pillow case for a/arom.  His tolerance to PROM is very limited due to pain. Pt was able to activate slight movement in fingers with small grasp, bicep flexion and slight elbow extension.    Pt then transferred to w/c with stand pivot with min A.  Set up with alarm belt, lap tray and ice pack to R shoulder.  Pt with all needs met.   Therapy Documentation Precautions:  Precautions Precautions: Fall Precaution Comments: R hemiplegia; wrist in splint due to fx; shoulder pain Restrictions Weight Bearing Restrictions: No      Pain: Pain Assessment Pain Scale: 0-10 Pain Score: 0-No pain -   c/o R shoulder pain with very small PROM  movements to shoulder and even with elbow       Therapy/Group: Individual Therapy  Beatrice 09/15/2018, 11:29 AM

## 2018-09-15 NOTE — Progress Notes (Signed)
Occupational Therapy Session Note  Patient Details  Name: TAMOTSU WIEDERHOLT MRN: [id number] Date of Birth: Nov 27, 1955  Today's Date: 09/15/2018 OT Individual Time: 0700-0830 OT Individual Time Calculation (min): 90 min    Short Term Goals: Week 2:  OT Short Term Goal 1 (Week 2): Pt will thread BLE into pants with min A in preparation for standing to pull pants over hips OT Short Term Goal 2 (Week 2): Pt will don socks with min A OT Short Term Goal 3 (Week 2): Pt will perform toileting tasks with mod A  Skilled Therapeutic Interventions/Progress Updates:    OT intervention with focus on bed mobility, functional transfers, sit<>stand, standing balance, BADL retraining, activity tolerance, and safety awareness to increase independence with BADLs.  See below for BADL assist levels.  Pt required min A for supine>sit EOB and stand pivot transfer to w/c and w/c<>tub bench.  Pt continues to depend on LUE to assist with transfers with grab bar.  Educated pt on importance of pushing through BLE instead of pulling up with LUE. Pt occasionally c/o LUE/forearm discomfort from overuse.  Pt requires min A for standing balance to pull pants over hips.  Pt is able to don L sock and shoe but requires assistance with R sock and shoe/AFO. Pt required mod A for donning shirt this morning.  Reenforced alternate method for donning shirt which he has used in past with more success. Pt transitioned to gym and practiced squats from EOM with min A and max verbal cues for sequencing. Pt returned to room and remained in w/c with all needs within reach and belt alarm activated.   Therapy Documentation Precautions:  Precautions Precautions: Fall Precaution Comments: R hemiplegia; wrist in splint due to fx; shoulder pain Restrictions Weight Bearing Restrictions: No   Pain:  Pt states his shoulder "feels better" but still "tight"; repositioned ADL: ADL Eating: Supervision/safety, Minimal cueing Where Assessed-Eating:  Wheelchair Grooming: Supervision/safety Where Assessed-Grooming: Sitting at sink Upper Body Bathing: Minimal assistance Where Assessed-Upper Body Bathing: Shower Lower Body Bathing: Moderate assistance Where Assessed-Lower Body Bathing: Shower Upper Body Dressing: Moderate assistance, Minimal assistance Where Assessed-Upper Body Dressing: Wheelchair Lower Body Dressing: Moderate assistance, Moderate cueing Where Assessed-Lower Body Dressing: Wheelchair, Standing at sink Toileting: Maximal assistance Where Assessed-Toileting: Glass blower/designer: Psychiatric nurse Method: Arts development officer: Energy manager: Minimal assistance, Moderate cueing Social research officer, government Method: Radiographer, therapeutic: Grab bars, Shower seat without back, Transfer tub bench   Therapy/Group: Individual Therapy  [name] 09/15/2018, 8:43 AM

## 2018-09-15 NOTE — Patient Care Conference (Signed)
Inpatient RehabilitationTeam Conference and Plan of Care Update Date: 09/13/2018   Time: 11:05 AM    Patient Name: Edward Glass      Medical Record Number: [id number]  Date of Birth: 11/20/1955 Sex: Male         Room/Bed: 4W21C/4W21C-01 Payor Info: Payor: [place] / Plan: [employer] OTHER / Product Type: *No Product type* /    Admitting Diagnosis: CVA  Admit Date/Time:  09/05/2018  1:37 PM Admission Comments: No comment available   Primary Diagnosis:  <principal problem not specified> Principal Problem: <principal problem not specified>  Patient Active Problem List   Diagnosis Date Noted  . Chronic right shoulder pain   . Primary osteoarthritis of right shoulder   . Left basal ganglia embolic stroke ([place]) 58/85/0277  . CVA (cerebral vascular accident) ([place]) 08/31/2018  . Elevated aspartate aminotransferase level 06/16/2015  . H/O nutritional disorder 06/16/2015  . Excessive salivation 06/16/2015  . 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 Heart palpitations 06/16/2015  . 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 Screening for prostate cancer 08/21/2014  . Atypical chest pain 08/10/2013  . 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 Alysia Penna Social Worker Present: Alfonse Alpers, LCSW Nurse Present: Dorthula Nettles, RN PT Present: Michaelene Song, PT OT Present: Roanna Epley, COTA SLP Present: Weston Anna, SLP PPS Coordinator present : Gunnar Fusi     Current Status/Progress Goal Weekly Team Focus  Medical   Right hemiparesis improving once again still severely weak, dysarthria remains, severe right shoulder arthritis  Reduce recurrent stroke risk reduce fall risk improve shoulder pain  Continue rehabilitation program, medication management, corticosteroid injection   Bowel/Bladder   Mostly continent of bowel & bladder. LBM 2/16  started on Scheduled stool softners 2/18.    continent of B/B normal bowel pattern   Timed toileting, PRN laxatives   Swallow/Nutrition/ Hydration   Dys. 2 textures with thin liquids, Full supervision  Mod I  tolerance of liquid upgrade, trials of upgraded textures    ADL's   min/mod A for functional transfers, min/mod A for bathing; min A UB dressing, mod A LB dressing, tot A toileting, significant tone RUE/shoulder  supervision-min assist  RUE NMR, functional tranfsers, education, BADL retraining, activity tolerance, safety awareness   Mobility   min assist bed mobility and transfers, mod assist gait with RW x50 ft  supervision-min assist   R NMR, balance, motor planning, gait   Communication   Min-Mod A  Supervision  increaesd vocal intensity, increased word-finding, increasing length of utterance    Safety/Cognition/ Behavioral Observations  Min-Mod A  Supervision  problem solving, awareness    Pain   denies pain  free of pain  assess pain qshift and PRN   Skin   No skin impairments  skin intact free of infection resolution of current issue  assess skin qshift and PRN    Rehab Goals Patient on target to meet rehab goals: Yes Rehab Goals Revised: none *See Care Plan and progress notes for long and short-term goals.     Barriers to Discharge  Current Status/Progress Possible Resolutions Date Resolved   Physician    Medical stability;Other (comments)  Severe right shoulder arthritis  Progressing with therapy  Corticosteroid injection needed      Nursing                  PT  OT                  SLP                SW                Discharge Planning/Teaching Needs:  Pt will d/c to his dtr's, [name], home.  She works from home and will be with pt, but other family and friends are available to assist as needed.  Family education will be offered closer to d/c.   Team Discussion:  Pt with arthritis in his shoulder and Dr. Letta Pate to inject it after  conference.  Pt is getting a little strength back and his vocal volume is better.  Pt is continent and pleased to be back on thin liquids, but is not having regular BMs.  Pt usinh kpad for his shoulder pain.  OT is working with pt on hemi bathing and dressing techniques.  Pt with apraxia when learning new techniques.  Pt with some finger flexion, biceps are kicking in.  Pt is still impulsive at times and OT tried passive stretching for shoulder pain.  PT stated pt is min A for bed mobility; min to mod A for txs; 50' min A for gait - overpowering with quads. Pt needs an orthotic.  Pt with MBS on Tuesday and swallowing is not perfect, still penetrating, but upgraded to thin liquids, still on D2 textures.  Pt is aphasic and ST in addition to working on swallowing, is also working on word finding, expression, and volume.  Revisions to Treatment Plan:  none    Continued Need for Acute Rehabilitation Level of Care: The patient requires daily medical management by a physician with specialized training in physical medicine and rehabilitation for the following conditions: Daily direction of a multidisciplinary physical rehabilitation program to ensure safe treatment while eliciting the highest outcome that is of practical value to the patient.: Yes Daily medical management of patient stability for increased activity during participation in an intensive rehabilitation regime.: Yes Daily analysis of laboratory values and/or radiology reports with any subsequent need for medication adjustment of medical intervention for : Neurological problems;Mood/behavior problems   I attest that I was present, lead the team conference, and concur with the assessment and plan of the team.   Boysie Bonebrake, Silvestre Mesi 09/15/2018, 1:45 PM

## 2018-09-15 NOTE — Progress Notes (Signed)
Physical Therapy Session Note  Patient Details  Name: Edward Glass MRN: [id number] Date of Birth: [birth year]  Today's Date: 09/15/2018 PT Individual Time: 1015-1100 PT Individual Time Calculation (min): 45 min   Short Term Goals: Week 2:  PT Short Term Goal 1 (Week 2): Pt will ambulated with LRAD x 50 ft and min assist PT Short Term Goal 2 (Week 2): Pt will perform bed<>chair transfers with CGA PT Short Term Goal 3 (Week 2): Pt will initiate stair training  Skilled Therapeutic Interventions/Progress Updates:   Pt in w/c and agreeable to therapy, reports pain in R shoulder - ongoing and pt did not rate. Total assist w/c transport to/from therapy gym for time management. Worked on [name] NMR this session w/ emphasis on decreasing R knee hyperextension in loading during gait. Stood w/ Estate manager/land agent and worked on [employer] shifting and loading in stance. Performed w/o moving LEs at first, progressing to L forward and backward stepping. Max manual and tactile cues at first, fading to occasional cues w/ repetition. 10+ reps at a time, x3-4 bouts of standing. Added in challenge of maintaining slight R knee flexion in loading, pt able to do so w/ min fading to contact guard assist. Ambulated 30' x2 w/ min assist w/o AD at end of session. Verbal cues to maintain slight flexed knee posture bilaterally during stance time, pt able to prevent R knee hyperextension in 75% of steps using this technique. Occasional min assist needed for R swing limb advancement and step placement. Min assist for lateral weight shifting during gait cycle. Returned to room via w/c and ended session in w/c, all needs in reach.   Therapy Documentation Precautions:  Precautions Precautions: Fall Precaution Comments: R hemiplegia; wrist in splint due to fx; shoulder pain Restrictions Weight Bearing Restrictions: No  Pain: Pain Assessment Pain Scale: 0-10 Pain Score: 0-No pain  Therapy/Group: Individual  Therapy  Kimo Bancroft [name] 09/15/2018, 12:03 PM

## 2018-09-15 NOTE — Progress Notes (Signed)
[name] PHYSICAL MEDICINE & REHABILITATION PROGRESS NOTE   Subjective/Complaints:  No issues overnite  Remains aphasic Feels the need for BM, none yesterday  ROS: limited due to language/communication   Objective:   No results found. No results for input(s): WBC, HGB, HCT, PLT in the last 72 hours. No results for input(s): NA, K, CL, CO2, GLUCOSE, BUN, CREATININE, CALCIUM in the last 72 hours.  Intake/Output Summary (Last 24 hours) at 09/15/2018 0908 Last data filed at 09/15/2018 0855 Gross per 24 hour  Intake 480 ml  Output 100 ml  Net 380 ml     Physical Exam: Vital Signs Blood pressure 113/69, pulse (!) 49, temperature (!) 97.5 F (36.4 C), resp. rate 18, height 5\' 5"  (1.651 m), weight 74.7 kg, SpO2 97 %.   Constitutional: No distress . Vital signs reviewed. HEENT: EOMI, oral membranes moist Neck: supple Cardiovascular: RRR without murmur. No JVD    Respiratory: CTA Bilaterally without wheezes or rales. Normal effort    GI: BS +, non-tender, non-distended  Extremities: No clubbing, cyanosis, or edema Skin: No evidence of breakdown, no evidence of rash Neurologic: Cranial nerves II through XII intact, motor strength is 4/5 in Left deltoid, bicep, tricep, grip, hip flexor, knee extensors, ankle dorsiflexor and plantar flexor, very dysarthric speech 2-/5 R finger flexion and biceps-- 2- hip adduction, 3- knee ext, 0/5 in RIght ankle No resting tone Senses pain in right arm and leg Cerebellar exam unable to perform on RIght due to weakness Musculoskeletal:pain with RIght shoujder abd, pain over R AC joint  Psych: pleasant, cooperative  Assessment/Plan: 1. Functional deficits secondary to RIght hemiparesis and aphasia which require 3+ hours per day of interdisciplinary therapy in a comprehensive inpatient rehab setting.  Physiatrist is providing close team supervision and 24 hour management of active medical problems listed below.  Physiatrist and rehab team  continue to assess barriers to discharge/monitor patient progress toward functional and medical goals  Care Tool:  Bathing    Body parts bathed by patient: Right arm, Chest, Abdomen, Front perineal area, Right upper leg, Left upper leg, Face, Left lower leg   Body parts bathed by helper: Left arm, Buttocks, Right lower leg     Bathing assist Assist Level: Moderate Assistance - Patient 50 - 74%     Upper Body Dressing/Undressing Upper body dressing   What is the patient wearing?: Pull over shirt    Upper body assist Assist Level: Moderate Assistance - Patient 50 - 74%    Lower Body Dressing/Undressing Lower body dressing      What is the patient wearing?: Underwear/pull up, Pants     Lower body assist Assist for lower body dressing: Moderate Assistance - Patient 50 - 74%     Toileting Toileting    Toileting assist Assist for toileting: Total Assistance - Patient < 25%     Transfers Chair/bed transfer  Transfers assist     Chair/bed transfer assist level: Minimal Assistance - Patient > 75%     Locomotion Ambulation   Ambulation assist   Ambulation activity did not occur: Safety/medical concerns  Assist level: Minimal Assistance - Patient > 75% Assistive device: Walker-rolling Max distance: 50'   Walk 10 feet activity   Assist     Assist level: Minimal Assistance - Patient > 75% Assistive device: Walker-rolling   Walk 50 feet activity   Assist Walk 50 feet with 2 turns activity did not occur: Safety/medical concerns  Assist level: Minimal Assistance - Patient > 75% Assistive  device: Walker-rolling    Walk 150 feet activity   Assist Walk 150 feet activity did not occur: Safety/medical concerns  Assist level: 2 helpers Assistive device: Lite Gait    Walk 10 feet on uneven surface  activity   Assist Walk 10 feet on uneven surfaces activity did not occur: Safety/medical concerns         Wheelchair     Assist Will patient use  wheelchair at discharge?: Yes Type of Wheelchair: Manual    Wheelchair assist level: Supervision/Verbal cueing Max wheelchair distance: 150'    Wheelchair 50 feet with 2 turns activity    Assist    Wheelchair 50 feet with 2 turns activity did not occur: Safety/medical concerns   Assist Level: Supervision/Verbal cueing   Wheelchair 150 feet activity     Assist Wheelchair 150 feet activity did not occur: Safety/medical concerns   Assist Level: Supervision/Verbal cueing     Medical Problem List and Plan: 1.Right hemiparesis/aphasiasecondary 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 DVT Prophylaxis/Anticoagulation: Venous Dopplers lower extremity negative.Subcutaneous Lovenox for DVT prophylaxis 3. Pain Management:Tylenol as needed. No pain at present Glenohumeral osteoarthritis with probable loose cartilage in joint , improved after corticosteroid injection  4. Mood:Prozac 20 mg daily. Provide ego support , discussed recovery process and that it is not always linear but may come in spurts, , pt feels discourged , ask neuropsych to see 5. Neuropsych: This patientiscapable of making decisions on hisown behalf. 6. Skin/Wound Care:Routine skin checks 7. Fluids/Electrolytes/Nutrition:Routine in and out's with follow-up chemistries I 660ml on 2/21- enc fluids 8. Dysphagia. Dysphagia #2 THIN liquids- BMET normal, tolerating current diet-  MBS result reviewed, tolerating thin liquids 9. Hyperlipidemia. Lipitor 10. Permissive hypertension. Patient on no antihypertensive medications on admission Vitals:   09/14/18 1932 09/15/18 0504  BP: (!) 106/50 113/69  Pulse: (!) 54 (!) 49  Resp: 15  18  Temp: 98.2 F (36.8 C) (!) 97.5 F (36.4 C)  SpO2: 96% 97%  -good control 2/21 11. BPH. Uroxatral 10 mg 3 times a day once per day on Monday Wednesday Friday. Check PVRs 12. Possible right wrist fracture/Triquetrum.. Conservative care. Follow-up orthopedic service Dr. Stann Mainland. Weightbearing as tolerated with splint in place, will order Xray in ~1wk - no pain with gentle ROM of RIght wrist  13.  Constipation unresponsive to supp or oral laxative,  Fleets without results  Senna increased to 2 po BID, d/c fleets and sorbitol (not effective), TWE if no BM  LOS: 10 days A FACE TO FACE EVALUATION WAS PERFORMED  [name] 09/15/2018, 9:08 AM

## 2018-09-15 NOTE — Progress Notes (Signed)
Pt resting comfortably, family present. Pt denies any complaints.

## 2018-09-15 NOTE — Progress Notes (Signed)
Speech Language Pathology Daily Session Note  Patient Details  Name: Edward Glass MRN: [id number] Date of Birth: Sep 30, 1955  Today's Date: 09/15/2018 SLP Individual Time: 1445-1530 SLP Individual Time Calculation (min): 45 min  Short Term Goals: Week 2: SLP Short Term Goal 1 (Week 2): Patient will utilize speech intelligibility strategies to maximize intelligibility to ~75% at the phrase level with Min A verbal cues.  SLP Short Term Goal 2 (Week 2): Patient will demonstrate functional problem solving for basic and familiar tasks with Min A verbal cues.  SLP Short Term Goal 3 (Week 2): Patient will demonstrate selective attention to functional tasks for ~45 minutes with Supervision verbal cues for redirection.  SLP Short Term Goal 4 (Week 2): Patient will express basic wants/needs at the phrase level with Supervision multimodal cues.  SLP Short Term Goal 5 (Week 2): Patient will consume upgraded diet of Dys. 2 textures with thin liquids with minimal overt s/s of aspiration and supervision verbal cues for use of swallowing strategies.  SLP Short Term Goal 6 (Week 2): Patient will demonstrate efficient mastication and complete oral clearance with minimal overt s/s of aspiration with trials of Dys. 3 textures over 2 sessions prior to upgrade.   Skilled Therapeutic Interventions: Skilled treatment session focused on cognitive-linguistic goals. SLP facilitated session by providing extra time for patient to sit EOB and donn shoes prior to transferring to the wheelchair. SLP facilitated session by providing Mod A verbal cues for use of memory compensatory strategies for a recall during a novel task and extra time and Min A verbal cues for generative naming throughout task. Patient with intermittent verbal errors that he self-monitored and corrected with Mod I and was ~80% intelligible at the phrase level. Patient left upright in wheelchair with family present. Continue with current plan of care.       Pain No/Denies Pain  Therapy/Group: Individual Therapy  Deeksha Cotrell 09/15/2018, 3:43 PM

## 2018-09-16 MED ORDER — POLYETHYLENE GLYCOL 3350 17 G PO PACK
17.0000 g | PACK | Freq: Every day | ORAL | Status: DC | PRN
  Administered 2018-09-16 – 2018-09-17 (×2): 17 g via ORAL
  Filled 2018-09-16 (×2): qty 1

## 2018-09-16 NOTE — Progress Notes (Signed)
Edward Glass is a 63 y.o. male who is admitted for CIR with right hemiparesis and a aphasia  Past Medical History:  Diagnosis Date  . Atypical chest pain 08/10/2013  . Elevated aspartate aminotransferase level 06/16/2015  . Excessive salivation 06/16/2015  . H/O nutritional disorder 06/16/2015  . 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 Screening for prostate cancer 08/21/2014  . 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 No new problems. Slept well. Feeling OK.  Objective: Vital signs in last 24 hours: Temp:  [97.8 F (36.6 C)-97.9 F (36.6 C)] 97.9 F (36.6 C) (02/22 0404) Pulse Rate:  [50-60] 50 (02/22 0404) Resp:  [18-20] 18 (02/22 0404) BP: (113-115)/(63-66) 114/63 (02/22 0404) SpO2:  [96 %-99 %] 99 % (02/22 0404) Weight change:  Last BM Date: 09/13/18  Intake/Output from previous day: 02/21 0701 - 02/22 0700 In: 820 [P.O.:820] Out: 100 [Urine:100]  Patient Vitals for the past 24 hrs:  BP Temp Temp src Pulse Resp SpO2  09/16/18 0404 114/63 97.9 F (36.6 C) Oral (!) 50 18 99 %  09/15/18 1954 115/66 97.8 F (36.6 C) - (!) 57 19 96 %  09/15/18 1428 113/66 97.8 F (36.6 C) - 60 20 97 %     Physical Exam General: No apparent distress   HEENT: not dry Lungs: Normal effort. Lungs clear to auscultation, no crackles or wheezes. Cardiovascular: Regular rate and rhythm, no edema Abdomen: S/NT/ND; BS(+) Musculoskeletal:  Unchanged  Neurological: No new neurological deficits  Wounds: N/A    Skin: clear no rash Mental state: Alert, oriented, cooperative    Lab Results: BMET    Component Value Date/Time   NA 139 09/06/2018 0606   K 4.1 09/06/2018 0606   CL 102 09/06/2018 0606   CO2 29 09/06/2018 0606   GLUCOSE 119 (H) 09/06/2018 0606   BUN 14 09/06/2018 0606   CREATININE 0.89 09/06/2018 0606   CREATININE 1.03 08/15/2013 0818   CALCIUM 8.6 (L) 09/06/2018 0606   GFRNONAA >60 09/06/2018 0606   GFRAA >60  09/06/2018 0606   CBC    Component Value Date/Time   WBC 9.3 09/06/2018 0606   RBC 5.12 09/06/2018 0606   HGB 14.8 09/06/2018 0606   HCT 45.7 09/06/2018 0606   PLT 335 09/06/2018 0606   MCV 89.3 09/06/2018 0606   MCH 28.9 09/06/2018 0606   MCHC 32.4 09/06/2018 0606   RDW 12.4 09/06/2018 0606   LYMPHSABS 2.2 09/06/2018 0606   MONOABS 1.1 (H) 09/06/2018 0606   EOSABS 0.3 09/06/2018 0606   BASOSABS 0.1 09/06/2018 0606     Medications: I have reviewed the patient's current medications.  Assessment/Plan:  Functional deficits secondary to right hemiparesis with a aphasia.  Continue CIR DVT prophylaxis.  Continue Lovenox History of hypertension.  Blood pressure has trended down; will continue to observe and avoid hypotension Dyslipidemia.  Continue atorvastatin   Length of stay, days: 11  Marletta Lor , MD 09/16/2018, 11:21 AM

## 2018-09-16 NOTE — Plan of Care (Signed)
  Problem: RH BOWEL ELIMINATION Goal: RH STG MANAGE BOWEL WITH ASSISTANCE Description STG Manage Bowel with mod  Assistance.  Outcome: Not Progressing; tap enema done early  morning smear  only per RN; MD made aware

## 2018-09-16 NOTE — Progress Notes (Signed)
Md notified of tap water enema results this morning; new orders noted.

## 2018-09-17 ENCOUNTER — Inpatient Hospital Stay (HOSPITAL_COMMUNITY): Payer: BLUE CROSS/BLUE SHIELD

## 2018-09-17 MED ORDER — BISACODYL 10 MG RE SUPP
10.0000 mg | Freq: Every day | RECTAL | Status: DC | PRN
  Administered 2018-09-17: 10 mg via RECTAL
  Filled 2018-09-17 (×2): qty 1

## 2018-09-17 NOTE — Progress Notes (Addendum)
Patient given suppository tonight together with his scheduled senokot-s. Patient had a medium size bowel movement while in the toilet. Patient felt that he still have to have another bowel movement and requested to stay in the toilet for a while. Patient c/o soreness in his bottom and  was brought back to bed.Patient's anus appeared to have mild prolapse of rectum, no bleeding noted. After few hours, patient had another bowel movement with mushy consistency. Rectum shows no more anal prolapse. Will continue to monitor.    Patient had third bowel movement at around 5am with combination of  separate hard lumps and mush consistency medium amount. RN asked patient if he still feels constipated or had some relief, he claims he still feels constipated. Offered patient miralax prn and he refused at this time.

## 2018-09-17 NOTE — Progress Notes (Signed)
Occupational Therapy Session Note  Patient Details  Name: Edward Glass MRN: [id number] Date of Birth: 12/29/1955  Today's Date: 09/17/2018 OT Individual Time: 0177-9390 OT Individual Time Calculation (min): 69 min    Short Term Goals: Week 2:  OT Short Term Goal 1 (Week 2): Pt will thread BLE into pants with min A in preparation for standing to pull pants over hips OT Short Term Goal 2 (Week 2): Pt will don socks with min A OT Short Term Goal 3 (Week 2): Pt will perform toileting tasks with mod A  Skilled Therapeutic Interventions/Progress Updates:    Session focused on bathing and dressing at shower level. Pt completed SPT from EOB to w/c with min A toward L side and then into shower with TTB. Pt required manual facilitation to lift R UE to wash underneath and with demo pt able to use hemi technique to wash under L UE. Pt stood with unilateral support on grab bars with min-CGA. Pt transferred out of shower and dressed at w/c level. Pt required moderate- heavy cueing for hemi techniques, including donning paretic limb first and compensatory techniques to lift or prop R UE/LE. Pt required min A to don underwear, requiring assistance to thread R LE and for dynamic balance support. Pt able to thread R LE into shorts with increased time to problem solve and CGA assist in standing for balance support. Mod A overall to don shirt, with assistance threading R UE, mainly d/t pain restricting propping/PROM. Pt completed shaving/grooming tasks at sink with set up assist. Pt still c/o high amounts of pain in R wrist, and edema present in hand. Pt participated in dunking of R hand/wrist in an ice bath x6 trials for 5 seconds. Pt reported pain relief from ice bath, and pt was edu on use for edema management. Pt edu further on positioning to promote fluid return.  Pt had several questions re etiology of  RUE weakness and CVA cause in general. General CVA education provided including patterns of motor recovery.  Pt left sitting up in w/c with SO present, chair alarm belt fastened.   Therapy Documentation Precautions:  Precautions Precautions: Fall Precaution Comments: R hemiplegia; wrist in splint due to fx; shoulder pain Restrictions Weight Bearing Restrictions: No Pain: Pain Assessment Pain Scale: 0-10 Pain Score: 6  Pain Type: Acute pain Pain Location: Hand Pain Orientation: Right Pain Descriptors / Indicators: Aching Pain Frequency: Intermittent Pain Onset: On-going Pain Intervention(s): Medication (See eMAR)  Therapy/Group: Individual Therapy  Curtis Sites 09/17/2018, 12:12 PM

## 2018-09-17 NOTE — Progress Notes (Signed)
RN offered dulcolax patient requested  to get it after dinner. Patient has a late tray.

## 2018-09-17 NOTE — Progress Notes (Signed)
Edward Glass is a 63 y.o. male admitted for CIR with functional deficits secondary to right hemiparesis and aphasia  Past Medical History:  Diagnosis Date  . Atypical chest pain 08/10/2013  . Elevated aspartate aminotransferase level 06/16/2015  . Excessive salivation 06/16/2015  . H/O nutritional disorder 06/16/2015  . 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 Screening for prostate cancer 08/21/2014  . 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 No new problems.  Doing well except for some constipation issues.  Continues to have considerable discomfort involving the right hand and wrist which remains in a brace  Objective: Vital signs in last 24 hours: Temp:  [98 F (36.7 C)-98.1 F (36.7 C)] 98.1 F (36.7 C) (02/23 0417) Pulse Rate:  [57-62] 57 (02/23 0417) Resp:  [18] 18 (02/23 0417) BP: (123-130)/(68-76) 130/72 (02/23 0417) SpO2:  [97 %] 97 % (02/23 0417) Weight change:  Last BM Date: 09/13/18  Intake/Output from previous day: 02/22 0701 - 02/23 0700 In: 460 [P.O.:460] Out: -   Patient Vitals for the past 24 hrs:  BP Temp Pulse Resp SpO2  09/17/18 0417 130/72 98.1 F (36.7 C) (!) 57 18 97 %  09/16/18 1931 129/76 98 F (36.7 C) 61 18 97 %  09/16/18 1830 123/68 98.1 F (36.7 C) 62 18 97 %     Physical Exam General: No apparent distress   HEENT: not dry Lungs: Normal effort. Lungs clear to auscultation, no crackles or wheezes. Cardiovascular: Regular rate and rhythm, no edema Abdomen: S/NT/ND; BS(+) Musculoskeletal:  Unchanged; complains of pain involving the right wrist and hand Neurological: No new neurological deficits with dysarthria and right-sided weakness  Skin: clear  Aging changes Mental state: Alert, oriented, cooperative    Lab Results: BMET    Component Value Date/Time   NA 139 09/06/2018 0606   K 4.1 09/06/2018 0606   CL 102 09/06/2018 0606   CO2 29 09/06/2018 0606   GLUCOSE 119 (H)  09/06/2018 0606   BUN 14 09/06/2018 0606   CREATININE 0.89 09/06/2018 0606   CREATININE 1.03 08/15/2013 0818   CALCIUM 8.6 (L) 09/06/2018 0606   GFRNONAA >60 09/06/2018 0606   GFRAA >60 09/06/2018 0606   CBC    Component Value Date/Time   WBC 9.3 09/06/2018 0606   RBC 5.12 09/06/2018 0606   HGB 14.8 09/06/2018 0606   HCT 45.7 09/06/2018 0606   PLT 335 09/06/2018 0606   MCV 89.3 09/06/2018 0606   MCH 28.9 09/06/2018 0606   MCHC 32.4 09/06/2018 0606   RDW 12.4 09/06/2018 0606   LYMPHSABS 2.2 09/06/2018 0606   MONOABS 1.1 (H) 09/06/2018 0606   EOSABS 0.3 09/06/2018 0606   BASOSABS 0.1 09/06/2018 0606    Medications: I have reviewed the patient's current medications.  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 ILR placed September 04, 2018.  Continue DAPT for 3 months then Plavix alone DVT prophylaxis.  Continue Lovenox Essential hypertension.  Well-controlled.  We will continue present regimen and continue to observe.  Avoid hypotension Persistent right wrist pain.  Continue splinting and analgesics Constipation.  We will add as needed Dulcolax    Length of stay, days: 12  Marletta Lor , MD 09/17/2018, 10:48 AM

## 2018-09-18 ENCOUNTER — Inpatient Hospital Stay (HOSPITAL_COMMUNITY): Payer: BLUE CROSS/BLUE SHIELD

## 2018-09-18 ENCOUNTER — Inpatient Hospital Stay (HOSPITAL_COMMUNITY): Payer: Self-pay

## 2018-09-18 MED ORDER — DOCUSATE SODIUM 100 MG PO CAPS
200.0000 mg | ORAL_CAPSULE | Freq: Three times a day (TID) | ORAL | Status: DC
  Administered 2018-09-18 – 2018-09-21 (×11): 200 mg via ORAL
  Filled 2018-09-18 (×13): qty 2

## 2018-09-18 NOTE — Progress Notes (Signed)
[place] PHYSICAL MEDICINE & REHABILITATION PROGRESS NOTE   Subjective/Complaints:  Had BM this am, straining on commode as per RN No Abd pain  ROS: limited due to language/communication   Objective:   No results found. No results for input(s): WBC, HGB, HCT, PLT in the last 72 hours. No results for input(s): NA, K, CL, CO2, GLUCOSE, BUN, CREATININE, CALCIUM in the last 72 hours.  Intake/Output Summary (Last 24 hours) at 09/18/2018 1056 Last data filed at 09/17/2018 2200 Gross per 24 hour  Intake 940 ml  Output 300 ml  Net 640 ml     Physical Exam: Vital Signs Blood pressure 123/77, pulse 87, temperature 98.1 F (36.7 C), resp. rate 18, height 5\' 5"  (1.651 m), weight 74.7 kg, SpO2 96 %.   Constitutional: No distress . Vital signs reviewed. HEENT: EOMI, oral membranes moist Neck: supple Cardiovascular: RRR without murmur. No JVD    Respiratory: CTA Bilaterally without wheezes or rales. Normal effort    GI: BS +, non-tender, non-distended  Extremities: No clubbing, cyanosis, or edema Skin: No evidence of breakdown, no evidence of rash Neurologic: Cranial nerves II through XII intact, motor strength is 4/5 in Left deltoid, bicep, tricep, grip, hip flexor, knee extensors, ankle dorsiflexor and plantar flexor, very dysarthric speech 2-/5 R finger flexion and biceps-- 2- hip adduction, 3- knee ext, 0/5 in RIght ankle unchanged Senses pain in right arm and leg Cerebellar exam unable to perform on RIght due to weakness Musculoskeletal:pain with RIght shoujder abd, pain over R AC joint  Psych: pleasant, cooperative  Assessment/Plan: 1. Functional deficits secondary to RIght hemiparesis and aphasia which require 3+ hours per day of interdisciplinary therapy in a comprehensive inpatient rehab setting.  Physiatrist is providing close team supervision and 24 hour management of active medical problems listed below.  Physiatrist and rehab team continue to assess barriers to  discharge/monitor patient progress toward functional and medical goals  Care Tool:  Bathing    Body parts bathed by patient: Right arm, Chest, Abdomen, Front perineal area, Right upper leg, Left upper leg, Face, Left lower leg, Buttocks   Body parts bathed by helper: Left arm, Right lower leg     Bathing assist Assist Level: Minimal Assistance - Patient > 75%     Upper Body Dressing/Undressing Upper body dressing   What is the patient wearing?: Pull over shirt    Upper body assist Assist Level: Moderate Assistance - Patient 50 - 74%    Lower Body Dressing/Undressing Lower body dressing      What is the patient wearing?: Underwear/pull up, Pants     Lower body assist Assist for lower body dressing: Minimal Assistance - Patient > 75%     Toileting Toileting    Toileting assist Assist for toileting: Total Assistance - Patient < 25%     Transfers Chair/bed transfer  Transfers assist     Chair/bed transfer assist level: Minimal Assistance - Patient > 75%     Locomotion Ambulation   Ambulation assist   Ambulation activity did not occur: Safety/medical concerns  Assist level: Minimal Assistance - Patient > 75% Assistive device: Other (comment)(none) Max distance: 30'   Walk 10 feet activity   Assist     Assist level: Minimal Assistance - Patient > 75% Assistive device: Other (comment)(none)   Walk 50 feet activity   Assist Walk 50 feet with 2 turns activity did not occur: Safety/medical concerns  Assist level: Minimal Assistance - Patient > 75% Assistive device: Walker-rolling  Walk 150 feet activity   Assist Walk 150 feet activity did not occur: Safety/medical concerns  Assist level: 2 helpers Assistive device: Lite Gait    Walk 10 feet on uneven surface  activity   Assist Walk 10 feet on uneven surfaces activity did not occur: Safety/medical concerns         Wheelchair     Assist Will patient use wheelchair at discharge?:  Yes Type of Wheelchair: Manual    Wheelchair assist level: Supervision/Verbal cueing Max wheelchair distance: 150'    Wheelchair 50 feet with 2 turns activity    Assist    Wheelchair 50 feet with 2 turns activity did not occur: Safety/medical concerns   Assist Level: Supervision/Verbal cueing   Wheelchair 150 feet activity     Assist Wheelchair 150 feet activity did not occur: Safety/medical concerns   Assist Level: Supervision/Verbal cueing     Medical Problem List and Plan: 1.Right hemiparesis/aphasiasecondary 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 DVT Prophylaxis/Anticoagulation: Venous Dopplers lower extremity negative.Subcutaneous Lovenox for DVT prophylaxis 3. Pain Management:Tylenol as needed. No pain at present Glenohumeral osteoarthritis with probable loose cartilage in joint , improved after corticosteroid injection  4. Mood:Prozac 20 mg daily. Provide ego support , discussed recovery process and that it is not always linear but may come in spurts, , pt feels discourged , ask neuropsych to see 5. Neuropsych: This patientiscapable of making decisions on hisown behalf. 6. Skin/Wound Care:Routine skin checks 7. Fluids/Electrolytes/Nutrition:Routine in and out's with follow-up chemistries Needs to increase fiber 8. Dysphagia. Dysphagia #2 THIN liquids- BMET normal, tolerating current diet-  MBS result reviewed, tolerating thin liquids 9. Hyperlipidemia. Lipitor 10. Permissive hypertension. Patient on no antihypertensive medications on admission Vitals:   09/17/18 1948 09/18/18 0403  BP: 125/73 123/77  Pulse: 63 87  Resp: 18 18  Temp: 98.3 F (36.8 C) 98.1 F (36.7  C)  SpO2: 96% 96%  -good control 2/24 11. BPH. Uroxatral 10 mg 3 times a day once per day on Monday Wednesday Friday. Check PVRs 12. Possible right wrist fracture/Triquetrum.. Conservative care. Follow-up orthopedic service Dr. Stann Mainland. Weightbearing as tolerated with splint in place, will order Xray in ~1wk - no pain with gentle ROM of RIght wrist  13.  Constipation unresponsive to supp or oral laxative,  Fleets without results  Change senna to colace  LOS: 13 days A FACE TO FACE EVALUATION WAS PERFORMED  [name] 09/18/2018, 10:56 AM

## 2018-09-18 NOTE — Progress Notes (Signed)
Occupational Therapy Session Note  Patient Details  Name: Edward Glass MRN: [id number] Date of Birth: 1956-03-08  Today's Date: 09/18/2018 OT Individual Time: 1330-1400 OT Individual Time Calculation (min): 30 min    Short Term Goals: Week 2:  OT Short Term Goal 1 (Week 2): Pt will thread BLE into pants with min A in preparation for standing to pull pants over hips OT Short Term Goal 2 (Week 2): Pt will don socks with min A OT Short Term Goal 3 (Week 2): Pt will perform toileting tasks with mod A  Skilled Therapeutic Interventions/Progress Updates:    Pt resting in bed upon arrival.  Kinesio tape applied to R hand/fingers for edema management.  Pt performed supine>sit EOB at supervision level with HOB elevated.  Pt performed squat pivot transfer to w/c with CGA.  Pt requested to use bathroom and performed stand pivot transfer with min A using grab bars.  Pt required max A for toileting tasks.  Pt remained in w/c with all needs within reach and belt alarm activated.   Therapy Documentation Precautions:  Precautions Precautions: Fall Precaution Comments: R hemiplegia; wrist in splint due to fx; shoulder pain Restrictions Weight Bearing Restrictions: No   Pain: Pain Assessment Pain Scale: 0-10 Pain Score: 0-No pain   Therapy/Group: Individual Therapy  [name] 09/18/2018, 2:41 PM

## 2018-09-18 NOTE — Progress Notes (Signed)
Speech Language Pathology Daily Session Note  Patient Details  Name: Edward Glass MRN: [id number] Date of Birth: [birth year]  Today's Date: 09/18/2018 SLP Individual Time: 0900-0958 SLP Individual Time Calculation (min): 58 min  Short Term Goals: Week 2: SLP Short Term Goal 1 (Week 2): Patient will utilize speech intelligibility strategies to maximize intelligibility to ~75% at the phrase level with Min A verbal cues.  SLP Short Term Goal 2 (Week 2): Patient will demonstrate functional problem solving for basic and familiar tasks with Min A verbal cues.  SLP Short Term Goal 3 (Week 2): Patient will demonstrate selective attention to functional tasks for ~45 minutes with Supervision verbal cues for redirection.  SLP Short Term Goal 4 (Week 2): Patient will express basic wants/needs at the phrase level with Supervision multimodal cues.  SLP Short Term Goal 5 (Week 2): Patient will consume upgraded diet of Dys. 2 textures with thin liquids with minimal overt s/s of aspiration and supervision verbal cues for use of swallowing strategies.  SLP Short Term Goal 6 (Week 2): Patient will demonstrate efficient mastication and complete oral clearance with minimal overt s/s of aspiration with trials of Dys. 3 textures over 2 sessions prior to upgrade.   Skilled Therapeutic Interventions:Skilled ST services focused on speech and swallow skills. SLP facilitated speech intelligibility at phrase level during verbal description of ideal workout session, pt required supervision A verbal cues word finding strategies and min A verbal cues for speech intelligibility with 80% accuracy. SLP facilitated word finding skills in communication barrier task, pt required mod A verbal cues to create descriptions and supervision A verbal cues to named  described word/object. Pt requested to use restroom and required mod A during transfer. SLP facilitated PO consumption of dys 3 trial, pt demonstrated oral clearnace and effective  mastication mod I.  Pt was left in room with call bell within reach and chair alarm set. Recommend to continue skilled ST services.      Pain Pain Assessment Pain Scale: 0-10 Pain Score: 0-No pain  Therapy/Group: Individual Therapy  Aliena Ghrist  [facility] 09/18/2018, 12:16 PM

## 2018-09-18 NOTE — Progress Notes (Signed)
Occupational Therapy Session Note  Patient Details  Name: Edward Glass MRN: [id number] Date of Birth: Feb 03, 1956  Today's Date: 09/18/2018 OT Individual Time: 0700-0800 OT Individual Time Calculation (min): 60 min    Short Term Goals: Week 2:  OT Short Term Goal 1 (Week 2): Pt will thread BLE into pants with min A in preparation for standing to pull pants over hips OT Short Term Goal 2 (Week 2): Pt will don socks with min A OT Short Term Goal 3 (Week 2): Pt will perform toileting tasks with mod A  Skilled Therapeutic Interventions/Progress Updates:    OT intervention with BADL retraiing, bed mobility, sit<>stand, standing balance, functional transfers, and safety awareness to increase independence with BADLs.  See below for BADL assist levels.  Pt continues to required min/mod verbal cues for sequencing and safety awareness.  Pt continues to required max verbal cues for hemi bathing/dressing techniques.  Pt requires CGA/min A for standing balance when pulling pants over hips.  Pt does not initiate RUE movement or initiation in BADLs.  Pt remained seated in w/c with all needs within reach and belt alarm activated.  Pts dtr present.   Therapy Documentation Precautions:  Precautions Precautions: Fall Precaution Comments: R hemiplegia; wrist in splint due to fx; shoulder pain Restrictions Weight Bearing Restrictions: No   Pain:  Pt constipated and c/o abdominal discomfort; RN Sherol Dade aware ADL: ADL Eating: Supervision/safety, Minimal cueing Where Assessed-Eating: Wheelchair Grooming: Supervision/safety Where Assessed-Grooming: Sitting at sink Upper Body Bathing: Minimal assistance Where Assessed-Upper Body Bathing: Shower Lower Body Bathing: Minimal assistance Where Assessed-Lower Body Bathing: Shower Upper Body Dressing: Moderate assistance, Minimal assistance Where Assessed-Upper Body Dressing: Wheelchair Lower Body Dressing: Moderate assistance, Moderate cueing Where  Assessed-Lower Body Dressing: Wheelchair, Standing at sink Toileting: Maximal assistance Where Assessed-Toileting: Glass blower/designer: Psychiatric nurse Method: Arts development officer: Energy manager: Minimal assistance, Moderate cueing Social research officer, government Method: Radiographer, therapeutic: Grab bars, Shower seat without back, Educational psychologist   Exercises:   Other Treatments:     Therapy/Group: Individual Therapy  [name] 09/18/2018, 8:17 AM

## 2018-09-18 NOTE — Progress Notes (Signed)
Physical Therapy Session Note  Patient Details  Name: Edward Glass MRN: [id number] Date of Birth: January 15, 1956  Today's Date: 09/18/2018 PT Individual Time: 1400-1500 PT Individual Time Calculation (min): 60 min   Short Term Goals: Week 2:  PT Short Term Goal 1 (Week 2): Pt will ambulated with LRAD x 50 ft and min assist PT Short Term Goal 2 (Week 2): Pt will perform bed<>chair transfers with CGA PT Short Term Goal 3 (Week 2): Pt will initiate stair training  Skilled Therapeutic Interventions/Progress Updates:    Pt seated in w/c upon PT arrival, agreeable to therapy tx and denies pain. Pt transported to the gym and performed bed<>mat transfers x 2 in each direction with CGA, emphasis on techniques and postural control throughout transfers. Pt performed x 10 sit<>stands this session without UE support for LE strengthening, emphasis on symmetric LE weightbearing. Pt worked on R LE stance control while stepping in place 2 x 10 with L LE, min assist. Pt ambulated 2 x 50 ft this session with RW and min assist, verbal cues for step length, manual facilitation for R glute activation during stance and verbal cues for foot clearance. Pt seated edge of mat worked on hamstring activation to perform heel slides with towel under shoe, 2 x 10 with verbal cues for techniques. Pt attempted to perform hamstring curl in standing, unable to move against gravity, also unable to perform in standing active assisted. Pt transferred to supine with supervision to worked on R LE neuro re-ed and strengthening. Pt performed x 10 active assisted hip flexion, performed 2 x 10 bridges, and performed sidelying active assisted knee flexion x 10. In sidelying pt performed R LE hip flexor stretch 2 x 30 sec and R LE quad stretch 2 x 30 sec. Pt transferred to sitting and transferred to w/c CGA. Pt transported back to room and left with needs in reach and chair alarm set.    Therapy Documentation Precautions:   Precautions Precautions: Fall Precaution Comments: R hemiplegia; wrist in splint due to fx; shoulder pain Restrictions Weight Bearing Restrictions: No   Therapy/Group: Individual Therapy  Netta Corrigan, PT, DPT 09/18/2018, 7:55 AM

## 2018-09-19 ENCOUNTER — Inpatient Hospital Stay (HOSPITAL_COMMUNITY): Payer: BLUE CROSS/BLUE SHIELD

## 2018-09-19 ENCOUNTER — Inpatient Hospital Stay (HOSPITAL_COMMUNITY): Payer: Self-pay | Admitting: Physical Therapy

## 2018-09-19 ENCOUNTER — Inpatient Hospital Stay (HOSPITAL_COMMUNITY): Payer: BLUE CROSS/BLUE SHIELD | Admitting: Speech Pathology

## 2018-09-19 MED ORDER — DICLOFENAC SODIUM 1 % TD GEL
2.0000 g | Freq: Four times a day (QID) | TRANSDERMAL | Status: DC
  Administered 2018-09-19 – 2018-09-29 (×35): 2 g via TOPICAL
  Filled 2018-09-19: qty 100

## 2018-09-19 NOTE — Progress Notes (Addendum)
[place] PHYSICAL MEDICINE & REHABILITATION PROGRESS NOTE   Subjective/Complaints:  Had 2 bowel movements yesterday, none today.  Still has right wrist pain  ROS: limited due to language/communication   Objective:   Dg Wrist 2 Views Right  Result Date: 09/19/2018 CLINICAL DATA:  63 year old male with a history of right wrist pain for 4 weeks EXAM: RIGHT WRIST - 2 VIEW COMPARISON:  None. FINDINGS: No acute displaced fracture. Degenerative changes. No focal soft tissue swelling. No radiopaque foreign body. IMPRESSION: No acute bony abnormality. Degenerative changes of the carpal bones. Electronically Signed   By: Corrie Mckusick D.O.   On: 09/19/2018 15:56   No results for input(s): WBC, HGB, HCT, PLT in the last 72 hours. No results for input(s): NA, K, CL, CO2, GLUCOSE, BUN, CREATININE, CALCIUM in the last 72 hours.  Intake/Output Summary (Last 24 hours) at 09/19/2018 1627 Last data filed at 09/19/2018 1329 Gross per 24 hour  Intake 360 ml  Output 1200 ml  Net -840 ml     Physical Exam: Vital Signs Blood pressure 140/75, pulse 60, temperature 98.2 F (36.8 C), temperature source Oral, resp. rate 16, height 5\' 5"  (1.651 m), weight 74.7 kg, SpO2 97 %.   Constitutional: No distress . Vital signs reviewed. HEENT: EOMI, oral membranes moist Neck: supple Cardiovascular: RRR without murmur. No JVD    Respiratory: CTA Bilaterally without wheezes or rales. Normal effort    GI: BS +, non-tender, non-distended  Extremities: No clubbing, cyanosis, or edema Skin: No evidence of breakdown, no evidence of rash Neurologic: Cranial nerves II through XII intact, motor strength is 4/5 in Left deltoid, bicep, tricep, grip, hip flexor, knee extensors, ankle dorsiflexor and plantar flexor, very dysarthric speech 2-/5 R finger flexion and biceps-- 2- hip adduction, 3- knee ext, 0/5 in RIght ankle unchanged Senses pain in right arm and leg Cerebellar exam unable to perform on RIght due to  weakness Musculoskeletal:pain with RIght shoujder abd, pain over R AC joint  Psych: pleasant, cooperative  Assessment/Plan: 1. Functional deficits secondary to RIght hemiparesis and aphasia which require 3+ hours per day of interdisciplinary therapy in a comprehensive inpatient rehab setting.  Physiatrist is providing close team supervision and 24 hour management of active medical problems listed below.  Physiatrist and rehab team continue to assess barriers to discharge/monitor patient progress toward functional and medical goals  Care Tool:  Bathing    Body parts bathed by patient: Right arm, Chest, Abdomen, Front perineal area, Right upper leg, Left upper leg, Face, Left lower leg, Buttocks   Body parts bathed by helper: Left arm, Right lower leg     Bathing assist Assist Level: Minimal Assistance - Patient > 75%     Upper Body Dressing/Undressing Upper body dressing   What is the patient wearing?: Pull over shirt    Upper body assist Assist Level: Moderate Assistance - Patient 50 - 74%    Lower Body Dressing/Undressing Lower body dressing      What is the patient wearing?: Underwear/pull up, Pants     Lower body assist Assist for lower body dressing: Minimal Assistance - Patient > 75%     Toileting Toileting    Toileting assist Assist for toileting: Total Assistance - Patient < 25%     Transfers Chair/bed transfer  Transfers assist     Chair/bed transfer assist level: Minimal Assistance - Patient > 75%     Locomotion Ambulation   Ambulation assist   Ambulation activity did not occur: Safety/medical concerns  Assist level: Minimal Assistance - Patient > 75% Assistive device: Walker-rolling(R AFO, toe cap, R hand orthosis) Max distance: 40 ft    Walk 10 feet activity   Assist     Assist level: Minimal Assistance - Patient > 75% Assistive device: Walker-rolling(R AFO, toe cap, R hand orthosis)   Walk 50 feet activity   Assist Walk 50 feet  with 2 turns activity did not occur: Safety/medical concerns  Assist level: Minimal Assistance - Patient > 75% Assistive device: Walker-rolling, Orthosis    Walk 150 feet activity   Assist Walk 150 feet activity did not occur: Safety/medical concerns  Assist level: 2 helpers Assistive device: Lite Gait    Walk 10 feet on uneven surface  activity   Assist Walk 10 feet on uneven surfaces activity did not occur: Safety/medical concerns         Wheelchair     Assist Will patient use wheelchair at discharge?: Yes Type of Wheelchair: Manual    Wheelchair assist level: Supervision/Verbal cueing Max wheelchair distance: 150'    Wheelchair 50 feet with 2 turns activity    Assist    Wheelchair 50 feet with 2 turns activity did not occur: Safety/medical concerns   Assist Level: Supervision/Verbal cueing   Wheelchair 150 feet activity     Assist Wheelchair 150 feet activity did not occur: Safety/medical concerns   Assist Level: Supervision/Verbal cueing     Medical Problem List and Plan: 1.Right hemiparesis/aphasiasecondary 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 -Continue splinting and range of motion exercises 2. DVT Prophylaxis/Anticoagulation: Venous Dopplers lower extremity negative.Subcutaneous Lovenox for DVT prophylaxis 3. Pain Management:Tylenol as needed. Some cont wrist  pain at present wrist x-ray show no fracture on repeat will add Voltaren gel continue using as needed splint Glenohumeral osteoarthritis with probable loose cartilage in joint , improved after corticosteroid injection  4. Mood:Prozac 20 mg daily. Provide ego support , discussed recovery process and that it is not always linear but may  come in spurts, , pt feels discourged , ask neuropsych to see 5. Neuropsych: This patientiscapable of making decisions on hisown behalf. 6. Skin/Wound Care:Routine skin checks 7. Fluids/Electrolytes/Nutrition:Routine in and out's with follow-up chemistries Needs to increase fiber 8. Dysphagia. Dysphagia #2 THIN liquids- BMET normal, tolerating current diet-  MBS result reviewed, tolerating thin liquids 9. Hyperlipidemia. Lipitor 10. Permissive hypertension. Patient on no antihypertensive medications on admission Vitals:   09/19/18 0519 09/19/18 1621  BP: 115/71 140/75  Pulse: 76 60  Resp: 18 16  Temp: 98.6 F (37 C) 98.2 F (36.8 C)  SpO2: 96% 97%  -good control 2/24 11. BPH. Uroxatral 10 mg 3 times a day once per day on Monday Wednesday Friday. Check PVRs 12. Possible right wrist fracture/Triquetrum.. Conservative care. Follow-up orthopedic service Dr. Stann Mainland. Weightbearing as tolerated with splint in place, will order Xray in ~1wk - no pain with gentle ROM of RIght wrist  13.  Constipation  Change senna to colace, recommend high-fiber diet LOS: 14 days A FACE TO [place] E Zaley Talley 09/19/2018, 4:27 PM

## 2018-09-19 NOTE — Progress Notes (Signed)
Occupational Therapy Session Note  Patient Details  Name: Edward Glass MRN: [id number] Date of Birth: 15-Jul-1956  Today's Date: 09/19/2018 OT Individual Time: [phone] OT Individual Time Calculation (min): 75 min    Short Term Goals: Week 3:  OT Short Term Goal 1 (Week 3): Pt will don socks with min A OT Short Term Goal 2 (Week 3): Pt will perform toileting tasks with mod A OT Short Term Goal 3 (Week 3): Pt will use his RUE as stabilizer with mod A OT Short Term Goal 4 (Week 3): Pt weill perform toilet transfers with CGA and min verbal cues for sequencing/safety  Skilled Therapeutic Interventions/Progress Updates:    OT intervention with focus on BADL retraining, sit<>stand, functional transfers, standing balance, activity tolerance, and safety awareness to increase independence with BADLs. See below for assist levels.  Pt continues to require min/mod verbal cues for safety awareness and sequencing during transfers.  Pt does not initiate use of RUE during functional tasks and continues to requires min/mod verbal cues for hemi dressing techniques.  Pt was able to thread his pants with min A this morning.  Pt requires assistance with donning R sock 2/2 limited trunk/hip flexion and inability to sit with firgure 4 to cross [name] on L knee. Pt remained seated in w/c with all needs within reach and belt alarm activated.   Therapy Documentation Precautions:  Precautions Precautions: Fall Precaution Comments: R hemiplegia; wrist in splint due to fx; shoulder pain Restrictions Weight Bearing Restrictions: No Pain:  Pt winces with R shoulder PROM ADL: ADL Eating: Supervision/safety, Minimal cueing Where Assessed-Eating: Wheelchair Grooming: Supervision/safety Where Assessed-Grooming: Sitting at sink Upper Body Bathing: Minimal assistance Where Assessed-Upper Body Bathing: Shower Lower Body Bathing: Moderate assistance Where Assessed-Lower Body Bathing: Shower Upper Body Dressing:  Moderate assistance, Minimal assistance Where Assessed-Upper Body Dressing: Wheelchair Lower Body Dressing: Moderate assistance, Moderate cueing Where Assessed-Lower Body Dressing: Wheelchair, Standing at sink Toileting: Maximal assistance Where Assessed-Toileting: Glass blower/designer: Psychiatric nurse Method: Arts development officer: Energy manager: Minimal assistance, Moderate cueing Social research officer, government Method: Radiographer, therapeutic: Grab bars, Shower seat without back, Educational psychologist   Exercises:   Other Treatments:     Therapy/Group: Individual Therapy  [name] 09/19/2018, 8:49 AM

## 2018-09-19 NOTE — Progress Notes (Signed)
Speech Language Pathology Weekly Progress and Session Note  Patient Details  Name: Edward Glass MRN: [id number] Date of Birth: 30-Sep-1955  Beginning of progress report period: September 12, 2018 End of progress report period: September 19, 2018  Today's Date: 09/19/2018 SLP Individual Time: 1100-1200 SLP Individual Time Calculation (min): 60 min  Short Term Goals: Week 2: SLP Short Term Goal 1 (Week 2): Patient will utilize speech intelligibility strategies to maximize intelligibility to ~75% at the phrase level with Min A verbal cues.  SLP Short Term Goal 1 - Progress (Week 2): Met SLP Short Term Goal 2 (Week 2): Patient will demonstrate functional problem solving for basic and familiar tasks with Min A verbal cues.  SLP Short Term Goal 2 - Progress (Week 2): Progressing toward goal SLP Short Term Goal 3 (Week 2): Patient will demonstrate selective attention to functional tasks for ~45 minutes with Supervision verbal cues for redirection.  SLP Short Term Goal 3 - Progress (Week 2): Met SLP Short Term Goal 4 (Week 2): Patient will express basic wants/needs at the phrase level with Supervision multimodal cues.  SLP Short Term Goal 4 - Progress (Week 2): Met SLP Short Term Goal 5 (Week 2): Patient will consume upgraded diet of Dys. 2 textures with thin liquids with minimal overt s/s of aspiration and supervision verbal cues for use of swallowing strategies.  SLP Short Term Goal 5 - Progress (Week 2): Met SLP Short Term Goal 6 (Week 2): Patient will demonstrate efficient mastication and complete oral clearance with minimal overt s/s of aspiration with trials of Dys. 3 textures over 2 sessions prior to upgrade.  SLP Short Term Goal 6 - Progress (Week 2): Met    New Short Term Goals: Week 3: SLP Short Term Goal 1 (Week 3): Patient will utilize speech intelligibility strategies to maximize intelligibility to ~80% at the phrase level with Supervision A verbal cues.  SLP Short Term Goal 2 (Week  3): Patient will demonstrate selective attention to functional tasks for ~45 minutes with Mod I verbal cues for redirection.  SLP Short Term Goal 3 (Week 3): Patient will express basic wants/needs at the sentence level with Supervision multimodal cues.  SLP Short Term Goal 4 (Week 3): Patient will consume upgraded diet of Dys. 3 textures with thin liquids with minimal overt s/s of aspiration and supervision verbal cues for use of swallowing strategies.  SLP Short Term Goal 5 (Week 3): Patient will demonstrate functional problem solving for basic and familiar tasks with Min A verbal cues.   Weekly Progress Updates: Pt has made excellent gains this reporting period and has met 5 out of 6 short term goals.  Pt is currently supervision to min assist for tasks due to oropharyngeal dysphagia, cognitive deficits, as well as aphasia.  Pt has demonstrated improved use of compensatory swallow strategies for oral clearance, attention, and ability to express basic wants and needs at the phrase level.  Pt is consuming dysphagia 3 diet and thin liquids with supervision cues for use of swallowing precautions.  Pt and family education is ongoing.  Pt would continue to benefit from skilled ST while inpatient in order to maximize functional independence and reduce burden of care prior to discharge.  Anticipate that pt will need 24/7 supervision at discharge in addition to [place] follow at next level of care.     Intensity: Minumum of 1-2 x/day, 30 to 90 minutes Frequency: 3 to 5 out of 7 days Duration/Length of Stay: 19-25 days  Treatment/Interventions:  Cognitive remediation/compensation;Environmental controls;Internal/external aids;Speech/Language facilitation;Therapeutic Activities;Patient/family education;Functional tasks;Dysphagia/aspiration precaution training;Cueing hierarchy   Daily Session  Skilled Therapeutic Interventions: Pt was seen for skilled ST focused on dysphagia, speech intelligibility, and word-finding  at the phrase and sentence levels. Pt consumed trial of upgraded dysphagia 3 solid with supervision verbal cues for use of compensatory swallow strategies and precautions. After lingual sweep and liquid wash, on oral residue or pocketing observed. Furthermore, no overt s/s aspiration were observed throughout pt's consumption of dysphagia 3 PO and thin liquids. Recommend upgrade diet to dysphagia 3 solids at this time, continue thin liquids. SLP facilitated session with conversational tasks to elicit generative speech about topics familiar to pt. Pt used speech intelligibility strategies with min assist verbal cues to increase vocal intensity. During a verbal description barrier game tasks, pt was with intermittent word-finding difficulties, requiring min assist verbal cues for use if word-finding strategies and accuracy when correcting errors (although pt is fully self-aware when errors occur). Pt was left in bed with bed alarm set, family member at bedside, and all needs met. Recommend continue per current plan of care.  General    Pain Pain Assessment Pain Scale: 0-10 Pain Score: 0-No pain  Therapy/Group: Individual Therapy  Jettie Booze, Student SLP  Jettie Booze 09/19/2018, 2:33 PM

## 2018-09-19 NOTE — Progress Notes (Addendum)
Physical Therapy Session Note  Patient Details  Name: Edward Glass MRN: [id number] Date of Birth: March 14, 1956  Today's Date: 09/19/2018 PT Individual Time: 0907-1001 PT Individual Time Calculation (min): 54 min   Short Term Goals: Week 2:  PT Short Term Goal 1 (Week 2): Pt will ambulated with LRAD x 50 ft and min assist PT Short Term Goal 2 (Week 2): Pt will perform bed<>chair transfers with CGA PT Short Term Goal 3 (Week 2): Pt will initiate stair training  Skilled Therapeutic Interventions/Progress Updates:  Pt received in bed & agreeable to tx.  Pt transfers to EOB with supervision, extra time & hospital bed features. Therapist assists with donning B shoes & R AFO for time management. Pt transfers bed>w/c on L with min assist stand pivot. Transported pt to gym via w/c dependent assist for time management. Pt transfers sit>stand with min assist, stand>sit with mod assist with max cuing for hand placement, assistance for managing RUE on hand orthosis. Pt ambulates 40 ft x 2 with RW, R hand orthosis, R toe cap & AFO with min assist with decreased weight shifting R, R knee A/P instability (recurvatum & buckling), and decreased hip/knee flexion & foot clearance with some improvement when pt is cued to advance [name] as if he's kicking a ball. Pt also with [name] external rotation during gait 2/2 weakness and inability to clear foot. Pt participated in use of Lite Gait, focusing on anterior weight shifting for sit>stand, and using lite gait to focus on equal weight bearing on BLE (at times Lite gait supports up to 50 lbs of pts weight on [name]). Focused on gait on treadmill (at speeds of 0.2>0.3>0.4) with LUE support and multimodal cuing for [name] hip/knee flexion and increased foot clearance during swing phase. Pt with significant use of LUE to assist with weight shifting so progressed to gait with decreased speed without BUE support with ongoing focus of [name] clearance during swing phase from increased hip/knee  flexion. Pt left in care of OT specialist Anderson Malta) continuing to use lite gait, upon this therapist's departure.   Pt reports pain in R wrist & shoulder after ongoing questioning, & support provided to extremity to help alleviate pain.    Therapy Documentation Precautions:  Precautions Precautions: Fall Precaution Comments: R hemiplegia; wrist in splint due to fx; shoulder pain Restrictions Weight Bearing Restrictions: No   Therapy/Group: Individual Therapy  Waunita Schooner 09/19/2018, 10:25 AM

## 2018-09-19 NOTE — Progress Notes (Signed)
Occupational Therapy Weekly Progress Note  Patient Details  Name: Edward Glass MRN: [id number] Date of Birth: 1956/06/16  Beginning of progress report period: September 12, 2018 End of progress report period: September 19, 2018  Patient has met 1 of 3 short term goals.  Pt progress with BADLs has been slow but steady during the past week. Pt continues to demonstrate significant gains with functional transfers but requires min/mod verbal cues for safety and sequencing.  Pt requires mod verbal cues for hemi dressing techniques. Pt currently requires mod A for LB dressing and min A for UB dressing.  Pt requires min A for bathing tasks with sit<>stand from tub bench.  Pt requires ,max A for toileting tasks. Pt has significant R shoulder tone and pain in addition to wrist splint which inhibits RUE use during functional tasks. Family has been present for therapy session but has not participated in providing assistance.    Patient continues to demonstrate the following deficits: muscle weakness, decreased cardiorespiratoy endurance, motor apraxia, decreased coordination and decreased motor planning, decreased motor planning and decreased sitting balance, decreased standing balance, decreased postural control, hemiplegia and decreased balance strategies and therefore will continue to benefit from skilled OT intervention to enhance overall performance with BADL.  Patient progressing toward long term goals..  Continue plan of care.  OT Short Term Goals Week 2:  OT Short Term Goal 1 (Week 2): Pt will thread BLE into pants with min A in preparation for standing to pull pants over hips OT Short Term Goal 1 - Progress (Week 2): Met OT Short Term Goal 2 (Week 2): Pt will don socks with min A OT Short Term Goal 2 - Progress (Week 2): Progressing toward goal OT Short Term Goal 3 (Week 2): Pt will perform toileting tasks with mod A OT Short Term Goal 3 - Progress (Week 2): Progressing toward goal Week 3:  OT  Short Term Goal 1 (Week 3): Pt will don socks with min A OT Short Term Goal 2 (Week 3): Pt will perform toileting tasks with mod A OT Short Term Goal 3 (Week 3): Pt will use his RUE as stabilizer with mod A OT Short Term Goal 4 (Week 3): Pt weill perform toilet transfers with CGA and min verbal cues for sequencing/safety       [name] 09/19/2018, 8:38 AM

## 2018-09-20 ENCOUNTER — Inpatient Hospital Stay (HOSPITAL_COMMUNITY): Payer: Self-pay | Admitting: Physical Therapy

## 2018-09-20 ENCOUNTER — Inpatient Hospital Stay (HOSPITAL_COMMUNITY): Payer: BLUE CROSS/BLUE SHIELD

## 2018-09-20 ENCOUNTER — Inpatient Hospital Stay (HOSPITAL_COMMUNITY): Payer: BLUE CROSS/BLUE SHIELD | Admitting: Speech Pathology

## 2018-09-20 ENCOUNTER — Inpatient Hospital Stay (HOSPITAL_COMMUNITY): Payer: BLUE CROSS/BLUE SHIELD | Admitting: Occupational Therapy

## 2018-09-20 MED ORDER — POLYETHYLENE GLYCOL 3350 17 G PO PACK
17.0000 g | PACK | Freq: Two times a day (BID) | ORAL | Status: DC
  Administered 2018-09-20 – 2018-09-28 (×18): 17 g via ORAL
  Filled 2018-09-20 (×19): qty 1

## 2018-09-20 NOTE — Progress Notes (Signed)
Occupational Therapy Session Note  Patient Details  Name: SHAQUELLE HERNON MRN: [id number] Date of Birth: May 27, 1956  Today's Date: 09/20/2018 OT Individual Time: 0700-0800 OT Individual Time Calculation (min): 60 min    Short Term Goals: Week 3:  OT Short Term Goal 1 (Week 3): Pt will don socks with min A OT Short Term Goal 2 (Week 3): Pt will perform toileting tasks with mod A OT Short Term Goal 3 (Week 3): Pt will use his RUE as stabilizer with mod A OT Short Term Goal 4 (Week 3): Pt weill perform toilet transfers with CGA and min verbal cues for sequencing/safety  Skilled Therapeutic Interventions/Progress Updates:    OT intervention with focus on BADL retraining, functional transfers, sit<>stand, standing balance, and safety awareness to increase independence with BADLs. Pt able to don R/L sock and thread BLE into pants this morning without assistance. Pt required only CGA to don shirt this morning.  Pt performs all functional transfers with min A/CGA and min verbal cues for safety awareness. See below for BADL assistance.  Pt remained in w/c with all needs within reach and belt alarm activated.   Therapy Documentation Precautions:  Precautions Precautions: Fall Precaution Comments: R hemiplegia; wrist in splint due to fx; shoulder pain Restrictions Weight Bearing Restrictions: No Pain:  Pt denies pain ADL: ADL Eating: Set up Where Assessed-Eating: Wheelchair Grooming: Supervision/safety Where Assessed-Grooming: Sitting at sink Upper Body Bathing: Minimal assistance Where Assessed-Upper Body Bathing: Shower Lower Body Bathing: Minimal assistance Where Assessed-Lower Body Bathing: Shower Upper Body Dressing: Minimal assistance Where Assessed-Upper Body Dressing: Wheelchair Lower Body Dressing: Minimal assistance Where Assessed-Lower Body Dressing: Wheelchair, Standing at sink [employer]: Minimal assistance, Moderate cueing Social research officer, government Method:  Radiographer, therapeutic: Grab bars, Shower seat without back, Transfer tub bench   Therapy/Group: Individual Therapy  [name] 09/20/2018, 8:00 AM

## 2018-09-20 NOTE — Progress Notes (Signed)
Physical Therapy Weekly Progress Note  Patient Details  Name: Edward Glass MRN: [id number] Date of Birth: [birth year]  Beginning of progress report period: September 13, 2018 End of progress report period: September 20, 2018  Today's Date: 09/20/2018 PT Individual Time: 1320-1415 PT Individual Time Calculation (min): 55 min  PT Individual missed time: 20 min (unavailable - eating lunch)   Patient has met 3 of 3 short term goals. Pt is performing bed mobility, transfers, and gait w/ CGA-min assist overall. Stair training was initiated this session, see details below. He continues to ambulate w/ severe R knee hyperextension in single leg stance phase of gait and would benefit from AFO use 2/2 proprioceptive deficits.   Patient continues to demonstrate the following deficits muscle weakness, decreased cardiorespiratoy endurance, abnormal tone, unbalanced muscle activation, motor apraxia, decreased coordination and decreased motor planning and decreased sitting balance, decreased standing balance, decreased postural control, hemiplegia and decreased balance strategies and therefore will continue to benefit from skilled PT intervention to increase functional independence with mobility.  Patient progressing toward long term goals..  Continue plan of care. Upgraded bed<>chair LTG to supervision and gait/stair goals to CGA.  PT Short Term Goals Week 2:  PT Short Term Goal 1 (Week 2): Pt will ambulated with LRAD x 50 ft and min assist PT Short Term Goal 1 - Progress (Week 2): Met PT Short Term Goal 2 (Week 2): Pt will perform bed<>chair transfers with CGA PT Short Term Goal 2 - Progress (Week 2): Met PT Short Term Goal 3 (Week 2): Pt will initiate stair training PT Short Term Goal 3 - Progress (Week 2): Met Week 3:  PT Short Term Goal 1 (Week 3): =LTGs due to ELOS  Skilled Therapeutic Interventions/Progress Updates:   Pt missed 20 min of skilled PT at beginning of session, had just received  lunch tray. Min assist transfer to EOB and donned shoes/RAFO w/ total assist for time management. CGA transfer to w/c. Total assist transfer to/from therapy gym for time management. Initiated stair training, negotiated 4 steps w/ CGA-min assist and unilateral handrail use. Verbal and visual cues for step pattern. Worked on gait training and [name] NMR. Ambulated 17' x2 w/ min assist using quad cane. No R knee hyperextension in stance w/ quad cane, however heavy UE support on cane to ambulate in more antalgic gait pattern. Pre-gait tasks at rail including R forward and backward stepping w/ emphasis on increasing foot clearance, manual assist to maintain neutral pelvic alignment and verbal/tactile cues for hamstring activation during swing phase. Performed w/ and w/o UE support, min assist overall. R step-up to 2" step w/o UE support to work on R knee control, min manual assist at R knee and trunk for balance. R heel slides during seated rest breaks w/ tactile cues for hamstring activation, palpable muscle contraction, improvement from last attempt w/ this therapist. Returned to room and ended session in w/c, all needs in reach and in care of daughter. Ice applied to dorsal aspect of wrist w/o wrist splint on. Pt and daughter agreeable to ice 20 min on and 20 min off over the next few hours for pain management and swelling management. Daughter agreeable to provide direct supervision for this and is able to don brace back on once pt is done icing.   Therapy Documentation Precautions:  Precautions Precautions: Fall Precaution Comments: R hemiplegia; wrist in splint due to fx; shoulder pain Restrictions Weight Bearing Restrictions: No  Therapy/Group: Individual Therapy  Cliffard Hair [name] 09/20/2018,  2:14 PM

## 2018-09-20 NOTE — Progress Notes (Signed)
[facility] PHYSICAL MEDICINE & REHABILITATION PROGRESS NOTE   Subjective/Complaints:  No BMs, had straining with senna S No abd pain  ROS: limited due to language/communication   Objective:   Dg Wrist 2 Views Right  Result Date: 09/19/2018 CLINICAL DATA:  63 year old male with a history of right wrist pain for 4 weeks EXAM: RIGHT WRIST - 2 VIEW COMPARISON:  None. FINDINGS: No acute displaced fracture. Degenerative changes. No focal soft tissue swelling. No radiopaque foreign body. IMPRESSION: No acute bony abnormality. Degenerative changes of the carpal bones. Electronically Signed   By: Corrie Mckusick D.O.   On: 09/19/2018 15:56   No results for input(s): WBC, HGB, HCT, PLT in the last 72 hours. No results for input(s): NA, K, CL, CO2, GLUCOSE, BUN, CREATININE, CALCIUM in the last 72 hours.  Intake/Output Summary (Last 24 hours) at 09/20/2018 1013 Last data filed at 09/20/2018 0700 Gross per 24 hour  Intake 360 ml  Output 700 ml  Net -340 ml     Physical Exam: Vital Signs Blood pressure (!) 109/56, pulse (!) 57, temperature 98 F (36.7 C), temperature source Oral, resp. rate 18, height 5' 5"  (1.651 m), weight 75.2 kg, SpO2 95 %.   Constitutional: No distress . Vital signs reviewed. HEENT: EOMI, oral membranes moist Neck: supple Cardiovascular: RRR without murmur. No JVD    Respiratory: CTA Bilaterally without wheezes or rales. Normal effort    GI: BS +, non-tender, non-distended  Extremities: No clubbing, cyanosis, or edema Skin: No evidence of breakdown, no evidence of rash Neurologic: Cranial nerves II through XII intact, motor strength is 4/5 in Left deltoid, bicep, tricep, grip, hip flexor, knee extensors, ankle dorsiflexor and plantar flexor, very dysarthric speech 2-/5 R finger flexion and 3- biceps-- 2- hip adduction, 3- knee ext, 0/5 in RIght ankle unchanged Senses pain in right arm and leg Cerebellar exam unable to perform on RIght due to  weakness Musculoskeletal:pain with RIght shoujder abd, pain over R AC joint  Psych: pleasant, cooperative  Assessment/Plan: 1. Functional deficits secondary to RIght hemiparesis and aphasia which require 3+ hours per day of interdisciplinary therapy in a comprehensive inpatient rehab setting.  Physiatrist is providing close team supervision and 24 hour management of active medical problems listed below.  Physiatrist and rehab team continue to assess barriers to discharge/monitor patient progress toward functional and medical goals  Care Tool:  Bathing    Body parts bathed by patient: Right arm, Chest, Abdomen, Front perineal area, Buttocks, Right upper leg, Left upper leg, Face   Body parts bathed by helper: Left arm, Right lower leg, Left lower leg     Bathing assist Assist Level: Minimal Assistance - Patient > 75%     Upper Body Dressing/Undressing Upper body dressing   What is the patient wearing?: Pull over shirt    Upper body assist Assist Level: Minimal Assistance - Patient > 75%    Lower Body Dressing/Undressing Lower body dressing      What is the patient wearing?: Pants     Lower body assist Assist for lower body dressing: Contact Guard/Touching assist     Toileting Toileting    Toileting assist Assist for toileting: Total Assistance - Patient < 25%     Transfers Chair/bed transfer  Transfers assist     Chair/bed transfer assist level: Minimal Assistance - Patient > 75%     Locomotion Ambulation   Ambulation assist   Ambulation activity did not occur: Safety/medical concerns  Assist level: Minimal Assistance -  Patient > 75% Assistive device: Walker-rolling(R AFO, toe cap, R hand orthosis) Max distance: 40 ft    Walk 10 feet activity   Assist     Assist level: Minimal Assistance - Patient > 75% Assistive device: Walker-rolling(R AFO, toe cap, R hand orthosis)   Walk 50 feet activity   Assist Walk 50 feet with 2 turns activity did  not occur: Safety/medical concerns  Assist level: Minimal Assistance - Patient > 75% Assistive device: Walker-rolling, Orthosis    Walk 150 feet activity   Assist Walk 150 feet activity did not occur: Safety/medical concerns  Assist level: 2 helpers Assistive device: Lite Gait    Walk 10 feet on uneven surface  activity   Assist Walk 10 feet on uneven surfaces activity did not occur: Safety/medical concerns         Wheelchair     Assist Will patient use wheelchair at discharge?: Yes Type of Wheelchair: Manual    Wheelchair assist level: Supervision/Verbal cueing Max wheelchair distance: 150'    Wheelchair 50 feet with 2 turns activity    Assist    Wheelchair 50 feet with 2 turns activity did not occur: Safety/medical concerns   Assist Level: Supervision/Verbal cueing   Wheelchair 150 feet activity     Assist Wheelchair 150 feet activity did not occur: Safety/medical concerns   Assist Level: Supervision/Verbal cueing     Medical Problem List and Plan: 1.Right hemiparesis/aphasiasecondary 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 Formulized individual treatment plan based on medical history, underlying problem and comorbidities.2. DVT Prophylaxis/Anticoagulation: Venous Dopplers lower extremity negative.Subcutaneous Lovenox for DVT prophylaxis 3. Pain Management:Tylenol as needed. Some cont wrist  pain at present wrist x-ray show no fracture on repeat will add Voltaren gel continue using as needed splint Glenohumeral osteoarthritis with probable loose cartilage in joint , improved after corticosteroid injection  4. Mood:Prozac  20 mg daily. Provide ego support , discussed recovery process and that it is not always linear but may come in spurts, , pt feels discourged , ask neuropsych to see 5. Neuropsych: This patientiscapable of making decisions on hisown behalf. 6. Skin/Wound Care:Routine skin checks 7. Fluids/Electrolytes/Nutrition:Routine in and out's with follow-up chemistries Needs to increase fiber 8. Dysphagia. Dysphagia #2 THIN liquids- BMET normal, tolerating current diet-  MBS result reviewed, tolerating thin liquids 9. Hyperlipidemia. Lipitor 10. Permissive hypertension. Patient on no antihypertensive medications on admission Vitals:   09/19/18 1959 09/20/18 0456  BP: 119/68 (!) 109/56  Pulse: 68 (!) 57  Resp: 18 18  Temp: 98.2 F (36.8 C) 98 F (36.7 C)  SpO2: 98% 95%  -good control 2/26 11. BPH. Uroxatral 10 mg 3 times a day once per day on Monday Wednesday Friday. Check PVRs 12. Possible right wrist fracture/Triquetrum.. Conservative care. Follow-up orthopedic service Dr. Stann Mainland. Weightbearing as tolerated with splint in place, will order Xray in ~1wk - no pain with gentle ROM of RIght wrist  13.  Constipation  Change senna to colace, recommend high-fiber diet, add miralax LOS: 15 days A FACE TO FACE EVALUATION WAS PERFORMED  [name] 09/20/2018, 10:13 AM

## 2018-09-20 NOTE — Progress Notes (Signed)
Speech Language Pathology Daily Session Note  Patient Details  Name: Edward Glass MRN: [id number] Date of Birth: [birth year]  Today's Date: 09/20/2018 SLP Individual Time: 0900-0940 SLP Individual Time Calculation (min): 40 min  Short Term Goals: Week 3: SLP Short Term Goal 1 (Week 3): Patient will utilize speech intelligibility strategies to maximize intelligibility to ~80% at the phrase level with Supervision A verbal cues.  SLP Short Term Goal 2 (Week 3): Patient will demonstrate selective attention to functional tasks for ~45 minutes with Mod I verbal cues for redirection.  SLP Short Term Goal 3 (Week 3): Patient will express basic wants/needs at the sentence level with Supervision multimodal cues.  SLP Short Term Goal 4 (Week 3): Patient will consume upgraded diet of Dys. 3 textures with thin liquids with minimal overt s/s of aspiration and supervision verbal cues for use of swallowing strategies.  SLP Short Term Goal 5 (Week 3): Patient will demonstrate functional problem solving for basic and familiar tasks with Min A verbal cues.   Skilled Therapeutic Interventions: Skilled treatment session focused on cognitive goals. SLP facilitated session by providing total A for recall of his current medications and their functions. However, only Min A verbal cues needed for functional problem solving while organizing a QD pill box. Patient requested to use the bathroom at end of session and left on commode with NT present. Continue with current plan of care.      Pain Pain Assessment Pain Scale: 0-10 Pain Score: 0-No pain  Therapy/Group: Individual Therapy  Navada Osterhout 09/20/2018, 3:58 PM

## 2018-09-20 NOTE — Progress Notes (Signed)
Occupational Therapy Session Note  Patient Details  Name: Edward Glass MRN: [id number] Date of Birth: Jan 11, 1956  Today's Date: 09/20/2018 OT Individual Time: 1000-1026 OT Individual Time Calculation (min): 26 min    Short Term Goals: Week 2:  OT Short Term Goal 1 (Week 2): Pt will thread BLE into pants with min A in preparation for standing to pull pants over hips OT Short Term Goal 1 - Progress (Week 2): Met OT Short Term Goal 2 (Week 2): Pt will don socks with min A OT Short Term Goal 2 - Progress (Week 2): Progressing toward goal OT Short Term Goal 3 (Week 2): Pt will perform toileting tasks with mod A OT Short Term Goal 3 - Progress (Week 2): Progressing toward goal  Skilled Therapeutic Interventions/Progress Updates:    Treatment session with focus on dynamic sitting balance and LB dressing.  Pt received supine in bed reporting feeling constipated but willing to engage in therapy session.  Pt completed bed mobility with min CGA to EOB.  Engaged in [place] with doffing and donning socks and shoes.  Pt able to don socks by crossing [name] over Lt ankle.  Issued shoe buttons and pt able to demonstrate ability to fasten and unfasten shoes with use of shoe buttons.  Pt continues to require assistance when donning Rt shoe with AFO.  Returned to semi-reclined in bed with all needs in reach.  Therapy Documentation Precautions:  Precautions Precautions: Fall Precaution Comments: R hemiplegia; wrist in splint due to fx; shoulder pain Restrictions Weight Bearing Restrictions: No Pain: Pain Assessment Pain Scale: 0-10 Pain Score: 0-No pain   Therapy/Group: Individual Therapy  Simonne Come 09/20/2018, 2:44 PM

## 2018-09-21 ENCOUNTER — Inpatient Hospital Stay (HOSPITAL_COMMUNITY): Payer: BLUE CROSS/BLUE SHIELD | Admitting: Speech Pathology

## 2018-09-21 ENCOUNTER — Inpatient Hospital Stay (HOSPITAL_COMMUNITY): Payer: BLUE CROSS/BLUE SHIELD | Admitting: Physical Therapy

## 2018-09-21 ENCOUNTER — Inpatient Hospital Stay (HOSPITAL_COMMUNITY): Payer: BLUE CROSS/BLUE SHIELD

## 2018-09-21 MED ORDER — HYDROCORTISONE ACE-PRAMOXINE 1-1 % RE FOAM
1.0000 | Freq: Two times a day (BID) | RECTAL | Status: DC
  Administered 2018-09-21 – 2018-09-28 (×12): 1 via RECTAL
  Filled 2018-09-21 (×5): qty 10

## 2018-09-21 NOTE — Patient Care Conference (Signed)
Inpatient RehabilitationTeam Conference and Plan of Care Update Date: 09/20/2018   Time: 11:20 AM    Patient Name: Edward Glass      Medical Record Number: [id number]  Date of Birth: 09-20-1955 Sex: Male         Room/Bed: 4W21C/4W21C-01 Payor Info: Payor: [place] / Plan: [employer] OTHER / Product Type: *No Product type* /    Admitting Diagnosis: CVA  Admit Date/Time:  09/05/2018  1:37 PM Admission Comments: No comment available   Primary Diagnosis:  <principal problem not specified> Principal Problem: <principal problem not specified>  Patient Active Problem List   Diagnosis Date Noted  . Chronic right shoulder pain   . Primary osteoarthritis of right shoulder   . Left basal ganglia embolic stroke ([place]) [phone]/71/[id number]  . CVA (cerebral vascular accident) ([place]) 08/31/2018  . Elevated aspartate aminotransferase level 06/16/2015  . H/O nutritional disorder 06/16/2015  . Excessive salivation 06/16/2015  . 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 Heart palpitations 06/16/2015  . 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 Screening for prostate cancer 08/21/2014  . Atypical chest pain 08/10/2013  . 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 Alysia Penna Social Worker Present: Alfonse Alpers, LCSW Nurse Present: Dorien Chihuahua, RN OT Present: Roanna Epley, COTA SLP Present: Weston Anna, SLP PPS Coordinator present : Gunnar Fusi     Current Status/Progress Goal Weekly Team Focus  Medical   shoulder pain improved, wrist pain still an issue, constipatied  Reduce stroke risk  laxative adjustment   Bowel/Bladder   continue to be continent of bowel and bladder; LBM 2/25; schedlued stool softerners   remain continent of B/B   monitor and assist PRN    Swallow/Nutrition/ Hydration   Dys. 3 textures with thin liquids Full Supervision   Mod I   tolerance of current diet    ADL's   functional transfers-min A/CGA; bathing-min A; UB dressing-min A; LB dressing-mod A; max A toileting, ongoing tone in R shoulder  supervision-min assist  RUE NMR, functional transfers, educaiton, BADL retraining, activity tolerance, safety awareness   Mobility   supervision bed mobility, CGA transfers, min assist gait with RW x 50 ft  supervision-min assist   R NMR, balance, motor planning, gait, steps   Communication   Min A  Supervision  word-finding and increased vocal intensity   Safety/Cognition/ Behavioral Observations  Min A  Supervision  complex problem solvng, awareness   Pain   denies pain   remain pain free   monitor and assess q shift and PRN    Skin   No skin issues   skin inatct and free from any new skin break down  monitor and assess q shift and PRN     Rehab Goals Patient on target to meet rehab goals: Yes Rehab Goals Revised: none *See Care Plan and progress notes for long and short-term goals.     Barriers to Discharge  Current Status/Progress Possible Resolutions Date Resolved   Physician    Medical stability     progressing  see above      Nursing                  PT  Inaccessible home environment  4 STE              OT  SLP                SW                Discharge Planning/Teaching Needs:  Pt will d/c to his dtr's, [name], home.  She works from home and will be with pt, but other family and friends are available to assist as needed.  Family education will be offered closer to d/c.  CSW to arrange with [name].   Team Discussion:  Pt is having trouble moving his bowels and complains of wrist pain.  Dr. Letta Pate may need to inject it, but said it is only sprained, not fractured.  Pt making steady progress with OT.  His shoulder slows him some.  UB dressing is still hard, but he's making progress with lower body.  His biceps are kicking in.  Pt is min A for PT tasks and goals will be upgraded.   Still needs assist with gait and will at home as well until gait pattern is more consistent.  He may need a R AFO, as he hyperextends.  Pt is on D3, thins and is min A for word finding.  Revisions to Treatment Plan:  none    Continued Need for Acute Rehabilitation Level of Care: The patient requires daily medical management by a physician with specialized training in physical medicine and rehabilitation for the following conditions: Daily direction of a multidisciplinary physical rehabilitation program to ensure safe treatment while eliciting the highest outcome that is of practical value to the patient.: Yes Daily medical management of patient stability for increased activity during participation in an intensive rehabilitation regime.: Yes Daily analysis of laboratory values and/or radiology reports with any subsequent need for medication adjustment of medical intervention for : Neurological problems   I attest that I was present, lead the team conference, and concur with the assessment and plan of the team.   Trey Sailors 09/21/2018, 6:31 PM

## 2018-09-21 NOTE — Progress Notes (Signed)
Occupational Therapy Session Note  Patient Details  Name: Edward Glass MRN: [id number] Date of Birth: [birth year]  Today's Date: 09/21/2018 OT Individual Time: [phone] OT Individual Time Calculation (min): 75 min    Short Term Goals: Week 3:  OT Short Term Goal 1 (Week 3): Pt will don socks with min A OT Short Term Goal 2 (Week 3): Pt will perform toileting tasks with mod A OT Short Term Goal 3 (Week 3): Pt will use his RUE as stabilizer with mod A OT Short Term Goal 4 (Week 3): Pt weill perform toilet transfers with CGA and min verbal cues for sequencing/safety  Skilled Therapeutic Interventions/Progress Updates:    OT intervention with focus on BADL retraining, functional transfers, sit<>stand, standing balance, and safety awareness to increase independence with BADLs. See below for BADL assistance.  Pt able to don pants with CGA this mornning.  Pt donned BLE socks, L shoe, and fasten B shoes (shoe button) without assistance this morning.  Pt completed grooming tasks with setup.  Pt attempts to initiate use of RUE in functional tasks but wrist tenderness/soreness and increased shoulder tone continue to inhibit functional use. Pt remained in w/c with all needs within reach and belt alarm activated.   Therapy Documentation Precautions:  Precautions Precautions: Fall Precaution Comments: R hemiplegia; wrist in splint due to fx; shoulder pain Restrictions Weight Bearing Restrictions: No  Pain: Pt states his wrist still "hurts a little"; wrist splint donned  ADL: ADL Eating: Set up Where Assessed-Eating: Wheelchair Grooming: Supervision/safety Where Assessed-Grooming: Sitting at sink Upper Body Bathing: Minimal assistance Where Assessed-Upper Body Bathing: Shower Lower Body Bathing: Minimal assistance Where Assessed-Lower Body Bathing: Shower Upper Body Dressing: Minimal assistance Where Assessed-Upper Body Dressing: Wheelchair Lower Body Dressing: Minimal assistance Where  Assessed-Lower Body Dressing: Wheelchair, Standing at sink Toileting: Moderate assistance Where Assessed-Toileting: Glass blower/designer: Psychiatric nurse Method: Arts development officer: Energy manager: Minimal assistance, Moderate cueing Social research officer, government Method: Radiographer, therapeutic: Grab bars, Shower seat without back, Transfer tub bench   Therapy/Group: Individual Therapy  [name] 09/21/2018, 9:26 AM

## 2018-09-21 NOTE — Progress Notes (Signed)
[facility] PHYSICAL MEDICINE & REHABILITATION PROGRESS NOTE   Subjective/Complaints:  No BMs, had straining with senna S No abd pain  ROS: limited due to language/communication   Objective:   Dg Wrist 2 Views Right  Result Date: 09/19/2018 CLINICAL DATA:  63 year old male with a history of right wrist pain for 4 weeks EXAM: RIGHT WRIST - 2 VIEW COMPARISON:  None. FINDINGS: No acute displaced fracture. Degenerative changes. No focal soft tissue swelling. No radiopaque foreign body. IMPRESSION: No acute bony abnormality. Degenerative changes of the carpal bones. Electronically Signed   By: Corrie Mckusick D.O.   On: 09/19/2018 15:56   No results for input(s): WBC, HGB, HCT, PLT in the last 72 hours. No results for input(s): NA, K, CL, CO2, GLUCOSE, BUN, CREATININE, CALCIUM in the last 72 hours.  Intake/Output Summary (Last 24 hours) at 09/21/2018 0933 Last data filed at 09/20/2018 1900 Gross per 24 hour  Intake 240 ml  Output 101 ml  Net 139 ml     Physical Exam: Vital Signs Blood pressure 116/80, pulse (!) 57, temperature 97.8 F (36.6 C), temperature source Oral, resp. rate 15, height 5\' 5"  (1.651 m), weight 75.2 kg, SpO2 97 %.   Constitutional: No distress . Vital signs reviewed. HEENT: EOMI, oral membranes moist Neck: supple Cardiovascular: RRR without murmur. No JVD    Respiratory: CTA Bilaterally without wheezes or rales. Normal effort    GI: BS +, non-tender, non-distended  Extremities: No clubbing, cyanosis, or edema Skin: No evidence of breakdown, no evidence of rash Neurologic: Cranial nerves II through XII intact, motor strength is 4/5 in Left deltoid, bicep, tricep, grip, hip flexor, knee extensors, ankle dorsiflexor and plantar flexor, very dysarthric speech 2-/5 R finger flexion and 3- biceps-- 2- hip adduction, 3- knee ext, 0/5 in RIght ankle unchanged Senses pain in right arm and leg Cerebellar exam unable to perform on RIght due to  weakness Musculoskeletal:pain with RIght shoujder abd, pain over R AC joint  Psych: pleasant, cooperative  Assessment/Plan: 1. Functional deficits secondary to RIght hemiparesis and aphasia which require 3+ hours per day of interdisciplinary therapy in a comprehensive inpatient rehab setting.  Physiatrist is providing close team supervision and 24 hour management of active medical problems listed below.  Physiatrist and rehab team continue to assess barriers to discharge/monitor patient progress toward functional and medical goals  Care Tool:  Bathing    Body parts bathed by patient: Right arm, Chest, Abdomen, Front perineal area, Buttocks, Right upper leg, Left upper leg, Face, Left lower leg   Body parts bathed by helper: Left arm, Right lower leg     Bathing assist Assist Level: Minimal Assistance - Patient > 75%     Upper Body Dressing/Undressing Upper body dressing   What is the patient wearing?: Pull over shirt    Upper body assist Assist Level: Minimal Assistance - Patient > 75%    Lower Body Dressing/Undressing Lower body dressing      What is the patient wearing?: Pants     Lower body assist Assist for lower body dressing: Contact Guard/Touching assist     Toileting Toileting    Toileting assist Assist for toileting: Moderate Assistance - Patient 50 - 74%     Transfers Chair/bed transfer  Transfers assist     Chair/bed transfer assist level: Contact Guard/Touching assist     Locomotion Ambulation   Ambulation assist   Ambulation activity did not occur: Safety/medical concerns  Assist level: Minimal Assistance - Patient > 75%  Assistive device: Cane-quad Max distance: 40 ft    Walk 10 feet activity   Assist     Assist level: Minimal Assistance - Patient > 75% Assistive device: Cane-quad   Walk 50 feet activity   Assist Walk 50 feet with 2 turns activity did not occur: Safety/medical concerns  Assist level: Minimal Assistance -  Patient > 75% Assistive device: Walker-rolling, Orthosis    Walk 150 feet activity   Assist Walk 150 feet activity did not occur: Safety/medical concerns  Assist level: 2 helpers Assistive device: Lite Gait    Walk 10 feet on uneven surface  activity   Assist Walk 10 feet on uneven surfaces activity did not occur: Safety/medical concerns         Wheelchair     Assist Will patient use wheelchair at discharge?: Yes Type of Wheelchair: Manual    Wheelchair assist level: Supervision/Verbal cueing Max wheelchair distance: 150'    Wheelchair 50 feet with 2 turns activity    Assist    Wheelchair 50 feet with 2 turns activity did not occur: Safety/medical concerns   Assist Level: Supervision/Verbal cueing   Wheelchair 150 feet activity     Assist Wheelchair 150 feet activity did not occur: Safety/medical concerns   Assist Level: Supervision/Verbal cueing     Medical Problem List and Plan: 1.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 DVT Prophylaxis/Anticoagulation: Venous Dopplers lower extremity negative.Subcutaneous Lovenox for DVT prophylaxis 3. Pain Management:Tylenol as needed. Some cont wrist  pain at present wrist x-ray show no fracture on repeat will add Voltaren gel continue using as needed splint Glenohumeral osteoarthritis with probable loose cartilage in joint , improved after corticosteroid injection  4. Mood:Prozac 20 mg daily. Provide ego support , discussed recovery process and that it is not always linear but may come in spurts, , pt feels discourged , ask neuropsych to see 5. Neuropsych: This patientiscapable of making decisions on hisown behalf. 6. Skin/Wound Care:Routine skin checks 7. Fluids/Electrolytes/Nutrition:Routine in and  out's with follow-up chemistries Needs to increase fiber 8. Dysphagia. Dysphagia #2 THIN liquids- BMET normal, tolerating current diet-  MBS result reviewed, tolerating thin liquids 9. Hyperlipidemia. Lipitor 10. Permissive hypertension. Patient on no antihypertensive medications on admission Vitals:   09/20/18 1928 09/21/18 0435  BP: 129/67 116/80  Pulse: 65 (!) 57  Resp: 16 15  Temp: 97.7 F (36.5 C) 97.8 F (36.6 C)  SpO2: 95% 97%  -good control 2/27 11. BPH. Uroxatral 10 mg 3 times a day once per day on Monday Wednesday Friday. Check PVRs 12. Possible right wrist fracture/Triquetrum.. Conservative care. Follow-up orthopedic service Dr. Stann Mainland. Weightbearing as tolerated with splint in place, will order Xray in ~1wk - no pain with gentle ROM of RIght wrist  13.  Constipation  Change senna to colace, recommend high-fiber diet, changed  miralax to BID LOS: 16 days A FACE TO [place] E  09/21/2018, 9:33 AM

## 2018-09-21 NOTE — Progress Notes (Addendum)
Physical Therapy Session Note  Patient Details  Name: Edward Glass MRN: [id number] Date of Birth: April 19, 1956  Today's Date: 09/21/2018 PT Individual Time: 1100-1200 AND 0932-6712 PT Individual Time Calculation (min): 60 min AND 25 min  Short Term Goals: Week 3:  PT Short Term Goal 1 (Week 3): =LTGs due to ELOS  Skilled Therapeutic Interventions/Progress Updates:   Session 1:  Pt in supine and agreeable to therapy, no c/o pain. Requesting to change clothes as he had spilled his urinal. Min assist supine>sit. Min-mod assist to don/doff shirt/pants. Min assist transfer to w/c. Total assist w/c transport to/from therapy gym. Session focused on gait training and [name] NMR. Ortho rep present to assess gait pattern for RAFO use. Ambulated in 20-40' bouts w/o AD, min assist, w/ rigid posterior leaf spring AFO. Added in heel wedge to R heel for decreased R knee hyperextension in stance. Transitioned to gait training over treadmill in litegait. Ambulated 0.2 mi @ 0.6-0.8 mph w/ and w/o UE support to work on normalizing gait pattern, facilitating more R weight shifting during L swing, and minimizing R hyperextension in single leg stance. Tactile and manual cues throughout at hips and for [name] step placement. Returned to room and ended session in w/c, all needs in reach.   Session 2:  Pt in w/c and agreeable to therapy, no c/o pain. Pt requesting set-up assist w/ voiding in urinal. Continent void w/ extra time. Total assist w/c transport to/from therapy gym. Worked on [name] posterior chain muscle activation while on NuStep. Performed LEs only @ level 4, 4 min. Added in green theraband at knees to maintain neutral R hip abduction during task x2 min. Took theraband away and therapist provided resistance on R knee extension aspect of cycle, 20 reps x3. Tactile cues throughout for R hamstring activation. Returned to room and ended session in w/c, all needs in reach.  Therapy Documentation Precautions:   Precautions Precautions: Fall Precaution Comments: R hemiplegia; wrist in splint due to fx; shoulder pain Restrictions Weight Bearing Restrictions: No  Therapy/Group: Individual Therapy  Thomasena Vandenheuvel [name] 09/21/2018, 12:39 PM

## 2018-09-21 NOTE — Progress Notes (Signed)
Speech Language Pathology Daily Session Note  Patient Details  Name: Edward Glass MRN: [id number] Date of Birth: 11-05-1955  Today's Date: 09/21/2018 SLP Individual Time: 1445-1530 SLP Individual Time Calculation (min): 45 min  Short Term Goals: Week 3: SLP Short Term Goal 1 (Week 3): Patient will utilize speech intelligibility strategies to maximize intelligibility to ~80% at the phrase level with Supervision A verbal cues.  SLP Short Term Goal 2 (Week 3): Patient will demonstrate selective attention to functional tasks for ~45 minutes with Mod I verbal cues for redirection.  SLP Short Term Goal 3 (Week 3): Patient will express basic wants/needs at the sentence level with Supervision multimodal cues.  SLP Short Term Goal 4 (Week 3): Patient will consume upgraded diet of Dys. 3 textures with thin liquids with minimal overt s/s of aspiration and supervision verbal cues for use of swallowing strategies.  SLP Short Term Goal 5 (Week 3): Patient will demonstrate functional problem solving for basic and familiar tasks with Min A verbal cues.   Skilled Therapeutic Interventions: Pt was seen for skilled ST focused on cognitive goals. SLP facilitated session with min verbal cues for problem solving and error awareness during a semi-complex logic puzzle. During a familiar semi-complex card game, pt recalled and verbally expressed the 3 rules associated with the game; he completed task with supervision verbal cues for strategic problem solving. Pt selectively attention to cognitive activities with supervision verbal cues for redirection in a moderately distracting environment with activity and background noise. Of note, his intelligibility continues to be decreased due to low vocal intensity. Pt was returned to room and left sitting in wheelchair with call bell within reach and visitors present. Recommend continue per current plan of care.      Pain Pain Assessment Pain Scale: Faces Faces Pain Scale:  No hurt  Therapy/Group: Individual Therapy   Jettie Booze, Student SLP   Jettie Booze 09/21/2018, 3:44 PM

## 2018-09-21 NOTE — Progress Notes (Signed)
Social Work Patient ID: Edward Glass, male   DOB: 12/06/1955, 62 y.o.   MRN: [id number]   CSW met with pt and his brother, [name], today to update them on team conference discussion and then later with Dena.  Pt is still on track for 09-29-18 d/c and they are pleased about this.  CSW will ask Rachel, dtr, and any other caregivers to come in next week for family education.  CSW remains available to assist as needed.  

## 2018-09-22 ENCOUNTER — Inpatient Hospital Stay (HOSPITAL_COMMUNITY): Payer: BLUE CROSS/BLUE SHIELD

## 2018-09-22 ENCOUNTER — Inpatient Hospital Stay (HOSPITAL_COMMUNITY): Payer: BLUE CROSS/BLUE SHIELD | Admitting: Physical Therapy

## 2018-09-22 ENCOUNTER — Inpatient Hospital Stay (HOSPITAL_COMMUNITY): Payer: BLUE CROSS/BLUE SHIELD | Admitting: Speech Pathology

## 2018-09-22 MED ORDER — HYDROCORTISONE ACETATE 25 MG RE SUPP
25.0000 mg | Freq: Two times a day (BID) | RECTAL | Status: DC
  Administered 2018-09-22 – 2018-09-28 (×12): 25 mg via RECTAL
  Filled 2018-09-22 (×14): qty 1

## 2018-09-22 MED ORDER — SENNOSIDES-DOCUSATE SODIUM 8.6-50 MG PO TABS
3.0000 | ORAL_TABLET | Freq: Two times a day (BID) | ORAL | Status: DC
  Administered 2018-09-22 – 2018-09-29 (×15): 3 via ORAL
  Filled 2018-09-22 (×15): qty 3

## 2018-09-22 NOTE — Progress Notes (Addendum)
[facility] PHYSICAL MEDICINE & REHABILITATION PROGRESS NOTE   Subjective/Complaints:  No issues overnite, no BM x 2 d  ROS: limited due to language/communication   Objective:   No results found. No results for input(s): WBC, HGB, HCT, PLT in the last 72 hours. No results for input(s): NA, K, CL, CO2, GLUCOSE, BUN, CREATININE, CALCIUM in the last 72 hours.  Intake/Output Summary (Last 24 hours) at 09/22/2018 0904 Last data filed at 09/22/2018 0841 Gross per 24 hour  Intake 240 ml  Output 150 ml  Net 90 ml     Physical Exam: Vital Signs Blood pressure 115/61, pulse (!) 50, temperature 97.9 F (36.6 C), temperature source Oral, resp. rate 16, height 5\' 5"  (1.651 m), weight 75.2 kg, SpO2 98 %.   Constitutional: No distress . Vital signs reviewed. HEENT: EOMI, oral membranes moist Neck: supple Cardiovascular: RRR without murmur. No JVD    Respiratory: CTA Bilaterally without wheezes or rales. Normal effort    GI: BS +, non-tender, non-distended  Extremities: No clubbing, cyanosis, or edema Skin: No evidence of breakdown, no evidence of rash Neurologic: Cranial nerves II through XII intact, motor strength is 4/5 in Left deltoid, bicep, tricep, grip, hip flexor, knee extensors, ankle dorsiflexor and plantar flexor, very dysarthric speech 2-/5 R finger flexion and 3- biceps-- 2- hip adduction, 3- knee ext, 0/5 in RIght ankle unchanged Senses pain in right arm and leg Cerebellar exam unable to perform on RIght due to weakness Musculoskeletal:pain with RIght shoujder abd, pain over R AC joint  Psych: pleasant, cooperative  Assessment/Plan: 1. Functional deficits secondary to RIght hemiparesis and aphasia which require 3+ hours per day of interdisciplinary therapy in a comprehensive inpatient rehab setting.  Physiatrist is providing close team supervision and 24 hour management of active medical problems listed below.  Physiatrist and rehab team continue to assess barriers to  discharge/monitor patient progress toward functional and medical goals  Care Tool:  Bathing    Body parts bathed by patient: Right arm, Chest, Abdomen, Front perineal area, Buttocks, Right upper leg, Left upper leg, Face, Left lower leg   Body parts bathed by helper: Left arm, Right lower leg     Bathing assist Assist Level: Minimal Assistance - Patient > 75%     Upper Body Dressing/Undressing Upper body dressing   What is the patient wearing?: Pull over shirt    Upper body assist Assist Level: Minimal Assistance - Patient > 75%    Lower Body Dressing/Undressing Lower body dressing      What is the patient wearing?: Pants     Lower body assist Assist for lower body dressing: Contact Guard/Touching assist     Toileting Toileting    Toileting assist Assist for toileting: Moderate Assistance - Patient 50 - 74%     Transfers Chair/bed transfer  Transfers assist     Chair/bed transfer assist level: Minimal Assistance - Patient > 75%     Locomotion Ambulation   Ambulation assist   Ambulation activity did not occur: Safety/medical concerns  Assist level: Minimal Assistance - Patient > 75% Assistive device: Other (comment)(none) Max distance: 40 ft    Walk 10 feet activity   Assist     Assist level: Minimal Assistance - Patient > 75% Assistive device: Cane-quad   Walk 50 feet activity   Assist Walk 50 feet with 2 turns activity did not occur: Safety/medical concerns  Assist level: Minimal Assistance - Patient > 75% Assistive device: Walker-rolling, Orthosis    Walk 150  feet activity   Assist Walk 150 feet activity did not occur: Safety/medical concerns  Assist level: 2 helpers Assistive device: Lite Gait    Walk 10 feet on uneven surface  activity   Assist Walk 10 feet on uneven surfaces activity did not occur: Safety/medical concerns         Wheelchair     Assist Will patient use wheelchair at discharge?: Yes Type of  Wheelchair: Manual    Wheelchair assist level: Supervision/Verbal cueing Max wheelchair distance: 150'    Wheelchair 50 feet with 2 turns activity    Assist    Wheelchair 50 feet with 2 turns activity did not occur: Safety/medical concerns   Assist Level: Supervision/Verbal cueing   Wheelchair 150 feet activity     Assist Wheelchair 150 feet activity did not occur: Safety/medical concerns   Assist Level: Supervision/Verbal cueing     Medical Problem List and Plan: 1.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 DVT Prophylaxis/Anticoagulation: Venous Dopplers lower extremity negative.Subcutaneous Lovenox for DVT prophylaxis 3. Pain Management:Tylenol as needed. Some cont wrist  pain at present wrist x-ray show no fracture on repeat will add Voltaren gel continue using as needed splint Glenohumeral osteoarthritis with probable loose cartilage in joint , improved after corticosteroid injection  4. Mood:Prozac 20 mg daily. Provide ego support , discussed recovery process and that it is not always linear but may come in spurts, , pt feels discourged , ask neuropsych to see 5. Neuropsych: This patientiscapable of making decisions on hisown behalf. 6. Skin/Wound Care:Routine skin checks 7. Fluids/Electrolytes/Nutrition:Routine in and out's with follow-up chemistries Needs to increase fiber 8. Dysphagia. Dysphagia #2 THIN liquids- BMET normal, tolerating current diet-  MBS result reviewed, tolerating thin liquids 9. Hyperlipidemia. Lipitor 10. Permissive hypertension. Patient on no antihypertensive medications on admission Vitals:   09/21/18 2032 09/22/18 0442  BP: 110/63 115/61  Pulse: (!) 56 (!) 50  Resp: 16 16  Temp: 97.9 F (36.6 C) 97.9 F (36.6 C)  SpO2:  98% 98%  -good control 2/28 11. BPH. Uroxatral 10 mg 3 times a day once per day on Monday Wednesday Friday. Check PVRs 12. Possible right wrist fracture/Triquetrum.. Conservative care. Follow-up orthopedic service Dr. Stann Mainland. Weightbearing as tolerated with splint in place, will order Xray in ~1wk - no pain with gentle ROM of RIght wrist  13.  Constipation  With some hemorrhoid issues, anusol supp   senna 3 BID recommend high-fiber diet, changed  miralax to BID LOS: 17 days A FACE TO [place] E Lasaundra Riche 09/22/2018, 9:04 AM

## 2018-09-22 NOTE — Progress Notes (Signed)
Pt slept well last night no c/o pain throughout night. Resting and appears comfortable.

## 2018-09-22 NOTE — Progress Notes (Signed)
Physical Therapy Session Note  Patient Details  Name: Edward Glass MRN: [id number] Date of Birth: 1956/04/14  Today's Date: 09/22/2018 PT Individual Time: 3552-1747 PT Individual Time Calculation (min): 70 min   Short Term Goals: Week 3:  PT Short Term Goal 1 (Week 3): =LTGs due to ELOS  Skilled Therapeutic Interventions/Progress Updates:   Pt in supine and agreeable to therapy, no c/o pain. Supine>sit w/ min assist and donned shoes w/ total assist for time management. CGA transfer to w/c. Pt self-propelled w/c to therapy gym via L hemi technique to work on independence w/ locomotion. Session focused on gait training, pre-gait tasks, and [name] muscle isolation. Ambulated 40' w/ min assist-CGA w/ L quad cane. Pt has no R knee flexion during swing and intermittent R knee hyperextension in stance, able to self-correct more consistently. Worked on isolating R hip flexion and knee flexion at the same time while tapping 2" step, added mirror w/ improved R hamstring activation, however still needed mod-max facilitation of movement. Transitioned to L sidelying to work on same movement in gravity-minimized position. Max tactile cues at R hamstring insertion. Transitioned back to sitting and able to isolate R knee flexion in sitting w/ resistance provided, 2/5 MMT. Unable to perform w/ R hip flexion occurring simultaneously. Able to isolate R iliopsoas minimally. Performed each activity in blocked practice w/ 50+ reps. Returned to room via w/c and ended session in w/c, all needs in reach.   Therapy Documentation Precautions:  Precautions Precautions: Fall Precaution Comments: R hemiplegia; wrist in splint due to fx; shoulder pain Restrictions Weight Bearing Restrictions: No Pain: Pain Assessment Pain Scale: 0-10 Pain Score: 0-No pain  Therapy/Group: Individual Therapy  Priscille Shadduck [name] 09/22/2018, 12:20 PM

## 2018-09-22 NOTE — Progress Notes (Signed)
Occupational Therapy Session Note  Patient Details  Name: Edward Glass MRN: [id number] Date of Birth: 10/10/1955  Today's Date: 09/22/2018 OT Individual Time: 6712-4580 OT Individual Time Calculation (min): 55 min    Short Term Goals: Week 1:  OT Short Term Goal 1 (Week 1): Pt will transfer to/ from toilet with mod A consistently OT Short Term Goal 1 - Progress (Week 1): Met OT Short Term Goal 2 (Week 1): Pt will don UB clothing with min A  OT Short Term Goal 2 - Progress (Week 1): Met OT Short Term Goal 3 (Week 1): Pt with perform one grooming task in standing with mod A for balance OT Short Term Goal 3 - Progress (Week 1): Met OT Short Term Goal 4 (Week 1): Pt will perform bed mobility in prep for ADL with mod A (to come to EOB) OT Short Term Goal 4 - Progress (Week 1): Met OT Short Term Goal 5 (Week 1): Pt will demonstrate safe placement of right UE at rest.  OT Short Term Goal 5 - Progress (Week 1): Met  Skilled Therapeutic Interventions/Progress Updates:    1:1. Pt received with girlfriend present requesting to shower. Pt with pain in wrist however declines medication intervention. Pt completes all stand pivot transfers with CGA-min A level using grab bar or bed rail w/c<>TTB/EOB. Pt completes bathing sit to stand with MIN A for standing balnace while washing buttocks/peri area. Pt requires A to wash LUE, however OT demo one handed technique to wash armpit and L arm. Pt dresses with min A to don shirt overhead and VC for pulling shirt up past elbow on R. Pt dons underwear with min A for standing balnace to advance pants pas thips and min A to thread [name] into pants. OT provides gentle PROM topt tolerance against flexor synergy patterns to increase ROM and decrease risk of contracture. Exited session with pt seated in w/c, call light inreach and all needs met  Therapy Documentation Precautions:  Precautions Precautions: Fall Precaution Comments: R hemiplegia; wrist in splint due to  fx; shoulder pain Restrictions Weight Bearing Restrictions: No General:   Vital Signs:  Pain:   ADL: ADL Eating: Set up Where Assessed-Eating: Wheelchair Grooming: Supervision/safety Where Assessed-Grooming: Sitting at sink Upper Body Bathing: Minimal assistance Where Assessed-Upper Body Bathing: Shower Lower Body Bathing: Minimal assistance Where Assessed-Lower Body Bathing: Shower Upper Body Dressing: Minimal assistance Where Assessed-Upper Body Dressing: Wheelchair Lower Body Dressing: Minimal assistance Where Assessed-Lower Body Dressing: Wheelchair, Standing at sink Toileting: Maximal assistance Where Assessed-Toileting: Glass blower/designer: Psychiatric nurse Method: Arts development officer: Energy manager: Minimal assistance, Moderate cueing Social research officer, government Method: Radiographer, therapeutic: Grab bars, Shower seat without back, Educational psychologist   Exercises:   Other Treatments:     Therapy/Group: Individual Therapy  Tonny Branch 09/22/2018, 2:58 PM

## 2018-09-22 NOTE — Progress Notes (Signed)
Speech Language Pathology Daily Session Note  Patient Details  Name: Edward Glass MRN: [id number] Date of Birth: 1956-05-26  Today's Date: 09/22/2018 SLP Individual Time: 0730-0830 SLP Individual Time Calculation (min): 60 min  Short Term Goals: Week 3: SLP Short Term Goal 1 (Week 3): Patient will utilize speech intelligibility strategies to maximize intelligibility to ~80% at the phrase level with Supervision A verbal cues.  SLP Short Term Goal 2 (Week 3): Patient will demonstrate selective attention to functional tasks for ~45 minutes with Mod I verbal cues for redirection.  SLP Short Term Goal 3 (Week 3): Patient will express basic wants/needs at the sentence level with Supervision multimodal cues.  SLP Short Term Goal 4 (Week 3): Patient will consume upgraded diet of Dys. 3 textures with thin liquids with minimal overt s/s of aspiration and supervision verbal cues for use of swallowing strategies.  SLP Short Term Goal 5 (Week 3): Patient will demonstrate functional problem solving for basic and familiar tasks with Min A verbal cues.   Skilled Therapeutic Interventions: Skilled treatment session focused on dysphagia and communication goals. SLP facilitated session by providing skilled observation with breakfast meal of Dys. 3 textures with thin liquids. Patient consumed meal without overt s/s of aspiration with overall Mod I for use of swallowing compensatory strategies. SLP also facilitated session by providing Mod A verbal cues for use of an increased vocal intensity in a moderately noisy environment to improve intelligibility to overall 75% at the sentence level. Patient left upright in wheelchair with family present. Continue with current plan of care.      Pain Pain Assessment Pain Scale: 0-10 Pain Score: 0-No pain  Therapy/Group: Individual Therapy  Natacia Chaisson 09/22/2018, 12:54 PM

## 2018-09-23 ENCOUNTER — Inpatient Hospital Stay (HOSPITAL_COMMUNITY): Payer: BLUE CROSS/BLUE SHIELD | Admitting: Physical Therapy

## 2018-09-23 NOTE — Progress Notes (Signed)
Physical Therapy Session Note  Patient Details  Name: Edward Glass MRN: [id number] Date of Birth: [birth year]  Today's Date: 09/23/2018 PT Individual Time: 0900-0928 PT Individual Time Calculation (min): 28 min   Short Term Goals: Week 3:  PT Short Term Goal 1 (Week 3): =LTGs due to ELOS  Skilled Therapeutic Interventions/Progress Updates:   Pt in w/c and agreeable to therapy, denies pain at rest. Total assist w/c transport to/from therapy gym. Worked on [name] NMR this session. Transferred to tall-kneeling on mat w/ mod assist and worked on R hip musculature activation in standing while performing lateral weight shifting activities. Tolerated 5 min in position, however needed rest 2/2 lower back pain in position. Pain resolved w/ returning to seated. Supine R single leg bridges 4x5 w/ tactile cues at hamstring musculature, palpable activation and able to lift off table. Seated heel slides, pt able to achieve ~110-120 deg R knee flexion w/o assist. Added in beige theraband and able to reach 100-110 deg. Gave theraband to use outside of therapy as pt's daughters and girlfriend have been helping him to perform HEP. Returned to room and ended session in w/c and in care of daughter, all needs met.   Therapy Documentation Precautions:  Precautions Precautions: Fall Precaution Comments: R hemiplegia; wrist in splint due to fx; shoulder pain Restrictions Weight Bearing Restrictions: No Pain: Pain Assessment Pain Scale: 0-10 Pain Score: 0-No pain  Therapy/Group: Individual Therapy  Edward Glass [name] 09/23/2018, 9:29 AM

## 2018-09-23 NOTE — Progress Notes (Signed)
Pt denies any pain. No obvious bleeding from hemorrhoids noted this evening. Medications given as ordered. Pt continent of urine. Pt currently resting appears comfortable. 2 SR up call light within reach. Family member remains with pt.

## 2018-09-23 NOTE — Progress Notes (Signed)
[place] PHYSICAL MEDICINE & REHABILITATION PROGRESS NOTE   Subjective/Complaints:  No issues overnite, small BM this am  ROS: limited due to language/communication   Objective:   No results found. No results for input(s): WBC, HGB, HCT, PLT in the last 72 hours. No results for input(s): NA, K, CL, CO2, GLUCOSE, BUN, CREATININE, CALCIUM in the last 72 hours.  Intake/Output Summary (Last 24 hours) at 09/23/2018 0944 Last data filed at 09/23/2018 0852 Gross per 24 hour  Intake 720 ml  Output 600 ml  Net 120 ml     Physical Exam: Vital Signs Blood pressure 112/71, pulse (!) 56, temperature 97.7 F (36.5 C), resp. rate 14, height 5\' 5"  (1.651 m), weight 75.2 kg, SpO2 96 %.   Constitutional: No distress . Vital signs reviewed. HEENT: EOMI, oral membranes moist Neck: supple Cardiovascular: RRR without murmur. No JVD    Respiratory: CTA Bilaterally without wheezes or rales. Normal effort    GI: BS +, non-tender, non-distended  Extremities: No clubbing, cyanosis, or edema Skin: No evidence of breakdown, no evidence of rash Neurologic: Cranial nerves II through XII intact, motor strength is 4/5 in Left deltoid, bicep, tricep, grip, hip flexor, knee extensors, ankle dorsiflexor and plantar flexor, very dysarthric speech 2-/5 R finger flexion and 3- biceps-- 2- hip adduction, 3- knee ext, 0/5 in RIght ankle unchanged Senses pain in right arm and leg Cerebellar exam unable to perform on RIght due to weakness Musculoskeletal:pain with RIght shoujder abd, pain over R AC joint  Psych: pleasant, cooperative  Assessment/Plan: 1. Functional deficits secondary to RIght hemiparesis and aphasia which require 3+ hours per day of interdisciplinary therapy in a comprehensive inpatient rehab setting.  Physiatrist is providing close team supervision and 24 hour management of active medical problems listed below.  Physiatrist and rehab team continue to assess barriers to discharge/monitor  patient progress toward functional and medical goals  Care Tool:  Bathing    Body parts bathed by patient: Right arm, Chest, Abdomen, Front perineal area, Buttocks, Right upper leg, Left upper leg, Face, Left lower leg   Body parts bathed by helper: Left arm, Right lower leg     Bathing assist Assist Level: Minimal Assistance - Patient > 75%     Upper Body Dressing/Undressing Upper body dressing   What is the patient wearing?: Pull over shirt    Upper body assist Assist Level: Minimal Assistance - Patient > 75%    Lower Body Dressing/Undressing Lower body dressing      What is the patient wearing?: Pants     Lower body assist Assist for lower body dressing: Minimal Assistance - Patient > 75%     Toileting Toileting    Toileting assist Assist for toileting: Moderate Assistance - Patient 50 - 74%     Transfers Chair/bed transfer  Transfers assist     Chair/bed transfer assist level: Contact Guard/Touching assist     Locomotion Ambulation   Ambulation assist   Ambulation activity did not occur: Safety/medical concerns  Assist level: Minimal Assistance - Patient > 75% Assistive device: Cane-quad Max distance: 40 ft    Walk 10 feet activity   Assist     Assist level: Minimal Assistance - Patient > 75% Assistive device: Cane-quad   Walk 50 feet activity   Assist Walk 50 feet with 2 turns activity did not occur: Safety/medical concerns  Assist level: Minimal Assistance - Patient > 75% Assistive device: Walker-rolling, Orthosis    Walk 150 feet activity   Assist  Walk 150 feet activity did not occur: Safety/medical concerns  Assist level: 2 helpers Assistive device: Lite Gait    Walk 10 feet on uneven surface  activity   Assist Walk 10 feet on uneven surfaces activity did not occur: Safety/medical concerns         Wheelchair     Assist Will patient use wheelchair at discharge?: Yes Type of Wheelchair: Manual    Wheelchair  assist level: Supervision/Verbal cueing Max wheelchair distance: 150'    Wheelchair 50 feet with 2 turns activity    Assist    Wheelchair 50 feet with 2 turns activity did not occur: Safety/medical concerns   Assist Level: Supervision/Verbal cueing   Wheelchair 150 feet activity     Assist Wheelchair 150 feet activity did not occur: Safety/medical concerns   Assist Level: Supervision/Verbal cueing     Medical Problem List and Plan: 1.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 DVT Prophylaxis/Anticoagulation: Venous Dopplers lower extremity negative.Subcutaneous Lovenox for DVT prophylaxis 3. Pain Management:Tylenol as needed.Right wrist sprain plus evidence of OA x-ray show no fracture on repeat will add Voltaren gel continue using as needed splint Glenohumeral osteoarthritis with probable loose cartilage in joint , improved after corticosteroid injection  4. Mood:Prozac 20 mg daily. Provide ego support , discussed recovery process and that it is not always linear but may come in spurts, , pt feels discourged , ask neuropsych to see 5. Neuropsych: This patientiscapable of making decisions on hisown behalf. 6. Skin/Wound Care:Routine skin checks 7. Fluids/Electrolytes/Nutrition:Routine in and out's with follow-up chemistries Needs to increase fiber 8. Dysphagia. Dysphagia #2 THIN liquids- BMET normal, tolerating current diet-  MBS result reviewed, tolerating thin liquids 9. Hyperlipidemia. Lipitor 10. Permissive hypertension. Patient on no antihypertensive medications on admission Vitals:   09/22/18 2044 09/23/18 0442  BP: 114/69 112/71  Pulse: (!) 56 (!) 56  Resp: 16 14  Temp: 98.2 F (36.8 C) 97.7 F (36.5 C)  SpO2: 97% 96%  -good control 2/29 11.  BPH. Uroxatral 10 mg 3 times a day once per day on Monday Wednesday Friday. Check PVRs 12. Possible right wrist fracture/Triquetrum.. Conservative care. Follow-up orthopedic service Dr. Stann Mainland. Weightbearing as tolerated with splint in place, will order Xray in ~1wk - no pain with gentle ROM of RIght wrist  13.  Constipation  With some hemorrhoid issues, anusol supp   senna 3 BID recommend high-fiber diet, changed  miralax to BID- slightly improved LOS: 18 days A FACE TO [place] E Bobbie Valletta 09/23/2018, 9:44 AM

## 2018-09-24 ENCOUNTER — Inpatient Hospital Stay (HOSPITAL_COMMUNITY): Payer: BLUE CROSS/BLUE SHIELD

## 2018-09-24 NOTE — Progress Notes (Signed)
[place] PHYSICAL MEDICINE & REHABILITATION PROGRESS NOTE   Subjective/Complaints:  Per RN large BM yesterday  ROS: limited due to language/communication   Objective:   No results found. No results for input(s): WBC, HGB, HCT, PLT in the last 72 hours. No results for input(s): NA, K, CL, CO2, GLUCOSE, BUN, CREATININE, CALCIUM in the last 72 hours.  Intake/Output Summary (Last 24 hours) at 09/24/2018 0849 Last data filed at 09/24/2018 0547 Gross per 24 hour  Intake 480 ml  Output 725 ml  Net -245 ml     Physical Exam: Vital Signs Blood pressure 109/66, pulse (!) 58, temperature 97.7 F (36.5 C), temperature source Oral, resp. rate 16, height 5\' 5"  (1.651 m), weight 75.2 kg, SpO2 98 %.   Constitutional: No distress . Vital signs reviewed. HEENT: EOMI, oral membranes moist Neck: supple Cardiovascular: RRR without murmur. No JVD    Respiratory: CTA Bilaterally without wheezes or rales. Normal effort    GI: BS +, non-tender, non-distended  Extremities: No clubbing, cyanosis, or edema Skin: No evidence of breakdown, no evidence of rash Neurologic: Cranial nerves II through XII intact, motor strength is 4/5 in Left deltoid, bicep, tricep, grip, hip flexor, knee extensors, ankle dorsiflexor and plantar flexor, very dysarthric speech 2-/5 R finger flexion and 3- biceps-- 2- hip adduction, 3- knee ext, 0/5 in RIght ankle unchanged Senses pain in right arm and leg Cerebellar exam unable to perform on RIght due to weakness Musculoskeletal:pain with RIght shoujder abd, pain over R AC joint  Psych: pleasant, cooperative  Assessment/Plan: 1. Functional deficits secondary to RIght hemiparesis and aphasia which require 3+ hours per day of interdisciplinary therapy in a comprehensive inpatient rehab setting.  Physiatrist is providing close team supervision and 24 hour management of active medical problems listed below.  Physiatrist and rehab team continue to assess barriers to  discharge/monitor patient progress toward functional and medical goals  Care Tool:  Bathing    Body parts bathed by patient: Right arm, Chest, Abdomen, Front perineal area, Buttocks, Right upper leg, Left upper leg, Face, Left lower leg   Body parts bathed by helper: Left arm, Right lower leg     Bathing assist Assist Level: Minimal Assistance - Patient > 75%     Upper Body Dressing/Undressing Upper body dressing   What is the patient wearing?: Pull over shirt    Upper body assist Assist Level: Minimal Assistance - Patient > 75%    Lower Body Dressing/Undressing Lower body dressing      What is the patient wearing?: Pants     Lower body assist Assist for lower body dressing: Minimal Assistance - Patient > 75%     Toileting Toileting    Toileting assist Assist for toileting: Moderate Assistance - Patient 50 - 74%     Transfers Chair/bed transfer  Transfers assist     Chair/bed transfer assist level: Moderate Assistance - Patient 50 - 74%     Locomotion Ambulation   Ambulation assist   Ambulation activity did not occur: Safety/medical concerns  Assist level: Minimal Assistance - Patient > 75% Assistive device: Cane-quad Max distance: 40 ft    Walk 10 feet activity   Assist     Assist level: Minimal Assistance - Patient > 75% Assistive device: Cane-quad   Walk 50 feet activity   Assist Walk 50 feet with 2 turns activity did not occur: Safety/medical concerns  Assist level: Minimal Assistance - Patient > 75% Assistive device: Walker-rolling, Orthosis    Walk 150  feet activity   Assist Walk 150 feet activity did not occur: Safety/medical concerns  Assist level: 2 helpers Assistive device: Lite Gait    Walk 10 feet on uneven surface  activity   Assist Walk 10 feet on uneven surfaces activity did not occur: Safety/medical concerns         Wheelchair     Assist Will patient use wheelchair at discharge?: Yes Type of  Wheelchair: Manual    Wheelchair assist level: Supervision/Verbal cueing Max wheelchair distance: 150'    Wheelchair 50 feet with 2 turns activity    Assist    Wheelchair 50 feet with 2 turns activity did not occur: Safety/medical concerns   Assist Level: Supervision/Verbal cueing   Wheelchair 150 feet activity     Assist Wheelchair 150 feet activity did not occur: Safety/medical concerns   Assist Level: Supervision/Verbal cueing     Medical Problem List and Plan: 1.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 DVT Prophylaxis/Anticoagulation: Venous Dopplers lower extremity negative.Subcutaneous Lovenox for DVT prophylaxis 3. Pain Management:Tylenol as needed.Right wrist sprain plus evidence of OA   Overall improving x-ray show no fracture on repeat will add Voltaren gel continue using as needed splint Glenohumeral osteoarthritis with probable loose cartilage in joint , improved after corticosteroid injection  4. Mood:Prozac 20 mg daily. Provide ego support , discussed recovery process and that it is not always linear but may come in spurts, , pt feels discourged , ask neuropsych to see 5. Neuropsych: This patientiscapable of making decisions on hisown behalf. 6. Skin/Wound Care:Routine skin checks 7. Fluids/Electrolytes/Nutrition:Routine in and out's with follow-up chemistries Needs to increase fiber 8. Dysphagia. Dysphagia #2 THIN liquids- BMET normal, tolerating current diet-  MBS result reviewed, tolerating thin liquids 9. Hyperlipidemia. Lipitor 10. Permissive hypertension. Patient on no antihypertensive medications on admission Vitals:   09/23/18 1948 09/24/18 0530  BP: 127/83 109/66  Pulse: 60 (!) 58  Resp: 18 16  Temp: 98.3 F (36.8 C) 97.7 F  (36.5 C)  SpO2: 98% 98%  -good control 3/1 11. BPH. Uroxatral 10 mg 3 times a day once per day on Monday Wednesday Friday. Check PVRs 12. Possible right wrist fracture/Triquetrum.. Conservative care. Follow-up orthopedic service Dr. Stann Mainland. Weightbearing as tolerated with splint in place, will order Xray in ~1wk - no pain with gentle ROM of RIght wrist  13.  Constipation- improving   With some hemorrhoid issues, anusol supp   senna 3 BID recommend high-fiber diet, changed  miralax to BID- slightly improved LOS: 19 days A FACE TO [place] E  09/24/2018, 8:49 AM

## 2018-09-24 NOTE — Progress Notes (Signed)
Occupational Therapy Session Note  Patient Details  Name: Edward Glass MRN: [id number] Date of Birth: 06/10/1956  Today's Date: 09/24/2018 OT Individual Time: 1015-1100 OT Individual Time Calculation (min): 45 min    Short Term Goals: Week 1:  OT Short Term Goal 1 (Week 1): Pt will transfer to/ from toilet with mod A consistently OT Short Term Goal 1 - Progress (Week 1): Met OT Short Term Goal 2 (Week 1): Pt will don UB clothing with min A  OT Short Term Goal 2 - Progress (Week 1): Met OT Short Term Goal 3 (Week 1): Pt with perform one grooming task in standing with mod A for balance OT Short Term Goal 3 - Progress (Week 1): Met OT Short Term Goal 4 (Week 1): Pt will perform bed mobility in prep for ADL with mod A (to come to EOB) OT Short Term Goal 4 - Progress (Week 1): Met OT Short Term Goal 5 (Week 1): Pt will demonstrate safe placement of right UE at rest.  OT Short Term Goal 5 - Progress (Week 1): Met  Skilled Therapeutic Interventions/Progress Updates:    1;1. Pt received in bed with "no more pain than normal [wrist]." Pt declines intervention. Pt agreeable to sohwer and transfer with CGA using bed rails and grab bar EOB>w/c<>TTB. Pt completes bathing sit to stand level with CGA and demo of washing LUE using compensatory technqiues. Pt dons underwear and pants using hemi techniques and MIN A for balance. Pt completes UB dressing with min A for threading head. Non skid socks donned by OT d/t time restrictions. Pt shaves at sink with s/u. Exited session with pt seated in w/c, call light in reach and belt alarm on  Therapy Documentation Precautions:  Precautions Precautions: Fall Precaution Comments: R hemiplegia; wrist in splint due to fx; shoulder pain Restrictions Weight Bearing Restrictions: No   Therapy/Group: Individual Therapy   M  09/24/2018, 10:40 AM 

## 2018-09-25 ENCOUNTER — Inpatient Hospital Stay (HOSPITAL_COMMUNITY): Payer: BLUE CROSS/BLUE SHIELD | Admitting: Speech Pathology

## 2018-09-25 ENCOUNTER — Inpatient Hospital Stay (HOSPITAL_COMMUNITY): Payer: BLUE CROSS/BLUE SHIELD | Admitting: Physical Therapy

## 2018-09-25 ENCOUNTER — Inpatient Hospital Stay (HOSPITAL_COMMUNITY): Payer: BLUE CROSS/BLUE SHIELD | Admitting: Occupational Therapy

## 2018-09-25 NOTE — Progress Notes (Signed)
Physical Therapy Session Note  Patient Details  Name: Edward Glass MRN: [id number] Date of Birth: [birth year]  Today's Date: 09/25/2018 PT Individual Time: 1035-1205 PT Individual Time Calculation (min): 90 min   Short Term Goals: Week 3:  PT Short Term Goal 1 (Week 3): =LTGs due to ELOS  Skilled Therapeutic Interventions/Progress Updates:  Pt received in w/c & agreeable to tx, pt reports 7/10 pain in R wrist but reports being premedicated & pain is better than it was. Pt's friend Joellen [place]) present to observe session. Pt propels w/c room>gym with L hemi technique and supervision. Pt transfers sit>stand with CGA & ambulates 100 ft with [facility] & CGA increasing to min assist as pt fatigues, and 1 LOB when completing L turn. Pt reports he will d/c to his daughter's house but is unable to recall entrance details but states she has about 5 steps to enter. Pt negotiates 4 steps (6") x 2 with seated break in between with L rail and min assist and cuing for compensatory pattern. Pt attempted R lateral step ups on 3" step but with great difficulty 2/2 [name] weakness so transitioned to side stepping at rail x 30 ft x 2 trials with CGA with cuing and focus on [name] foot clearance with increased L hip flexion, and abduction/adduction as well as knee flexion. Pt transitioned to tall kneeling x 3 minutes + 3 minutes while engaging in connect four game with task focusing on B hip extensor & glute strengthening with intermittent LUE support. Pt utilized cybex kinetron in sitting then standing with LUE support & CGA with task focusing on weight shifting L<>R, dynamic balance, [name] strengthening & NMR. Pt completed car transfer at [place] simulated height with instructional cuing to sit then place BLE in/out of car and min assist to manage [name]. At end of session pt returned to room & reports need to use restroom. Pt transfers onto toilet with min assist stand pivot and use of grab bar with therapist assisting with pulling pants  over hips on R side for time management. Pt with continent void on toilet & performs hand hygiene at sink with set up assist. Pt left sitting in w/c in room with chair alarm donned & needs at hand.  Focused on sequencing and hand placement with stand>sit transfers throughout session, with pt requiring cuing each time.  Therapy Documentation Precautions:  Precautions Precautions: Fall Precaution Comments: R hemiplegia; wrist in splint due to fx; shoulder pain Restrictions Weight Bearing Restrictions: No    Therapy/Group: Individual Therapy  Waunita Schooner 09/25/2018, 12:12 PM

## 2018-09-25 NOTE — Plan of Care (Signed)
  Problem: Consults Goal: RH STROKE PATIENT EDUCATION Description See Patient Education module for education specifics  Outcome: Progressing   Problem: RH SKIN INTEGRITY Goal: RH STG SKIN FREE OF INFECTION/BREAKDOWN Description No new breakdown with min assist   Outcome: Progressing   Problem: RH SAFETY Goal: RH STG ADHERE TO SAFETY PRECAUTIONS W/ASSISTANCE/DEVICE Description STG Adhere to Safety Precautions With min Assistance/Device.  Outcome: Progressing Flowsheets (Taken 09/25/2018 1336) STG:Pt will adhere to safety precautions with assistance/device: 3-Moderate assistance   Problem: RH PAIN MANAGEMENT Goal: RH STG PAIN MANAGED AT OR BELOW PT'S PAIN GOAL Description < 3 out of 10   Outcome: Progressing

## 2018-09-25 NOTE — Progress Notes (Signed)
Occupational Therapy Session Note  Patient Details  Name: Edward Glass MRN: [id number] Date of Birth: 1956/06/16  Today's Date: 09/25/2018 OT Individual Time: 5573-2202 OT Individual Time Calculation (min): 45 min    Short Term Goals: Week 1:  OT Short Term Goal 1 (Week 1): Pt will transfer to/ from toilet with mod A consistently OT Short Term Goal 1 - Progress (Week 1): Met OT Short Term Goal 2 (Week 1): Pt will don UB clothing with min A  OT Short Term Goal 2 - Progress (Week 1): Met OT Short Term Goal 3 (Week 1): Pt with perform one grooming task in standing with mod A for balance OT Short Term Goal 3 - Progress (Week 1): Met OT Short Term Goal 4 (Week 1): Pt will perform bed mobility in prep for ADL with mod A (to come to EOB) OT Short Term Goal 4 - Progress (Week 1): Met OT Short Term Goal 5 (Week 1): Pt will demonstrate safe placement of right UE at rest.  OT Short Term Goal 5 - Progress (Week 1): Met Week 2:  OT Short Term Goal 1 (Week 2): Pt will thread BLE into pants with min A in preparation for standing to pull pants over hips OT Short Term Goal 1 - Progress (Week 2): Met OT Short Term Goal 2 (Week 2): Pt will don socks with min A OT Short Term Goal 2 - Progress (Week 2): Progressing toward goal OT Short Term Goal 3 (Week 2): Pt will perform toileting tasks with mod A OT Short Term Goal 3 - Progress (Week 2): Progressing toward goal Week 3:  OT Short Term Goal 1 (Week 3): Pt will don socks with min A OT Short Term Goal 2 (Week 3): Pt will perform toileting tasks with mod A OT Short Term Goal 3 (Week 3): Pt will use his RUE as stabilizer with mod A OT Short Term Goal 4 (Week 3): Pt weill perform toilet transfers with CGA and min verbal cues for sequencing/safety  Skilled Therapeutic Interventions/Progress Updates:    Pt seen this session to focus on adaptive techniques for bathing and dressing. Pt worked on coming from supine to sit without rails and with HOB flat with  min A to fully bring trunk forward.  Pt donned shoe and AFO/shoe with A to be able to ambulate to bathroom using quad cane with min A.  Pt sat on tub bench and undressed with CGA/ verbal cues for technique.  Pt bathed self but may find it easier to use a long handled sponge to reach his feet and L arm. CGA in standing as he washed his bottom.  Pt used w/c to exit bathroom.  Donned shirt with min A to fully pull on R arm and then cues for technique to do his head next, fix the R shoulder and then push L arm through.  Pt then was able to do this.   Min A to donn underwear over R foot but then he could do the L foot. Pt able to don pants over feet.  From w/c pt could rise into stand with CGA but needed a cue to stabilize balance first before trying to pull pants over hips. Once he did this he could stand and balance with just hovering CGA.  Mod A for socks and max to don R shoe with AFO.    Pt is doing well with the adaptive techniques but continues to need some practice.   Pt resting in  w/c with chair belt alarm on and all needs met.  Therapy Documentation Precautions:  Precautions Precautions: Fall Precaution Comments: R hemiplegia; wrist in splint due to fx; shoulder pain Restrictions Weight Bearing Restrictions: No  Pain: Pain Assessment Pain Score: 0-No pain(pain in arm only with movement, no change from baseline)    Therapy/Group: Individual Therapy  [place] 09/25/2018, 10:22 AM

## 2018-09-25 NOTE — Progress Notes (Signed)
Edward Glass PHYSICAL MEDICINE & REHABILITATION PROGRESS NOTE   Subjective/Complaints:  Last BM yesterday , still with RIght shoulder and wrist pain but overall improved  ROS: limited due to language/communication   Objective:   No results found. No results for input(s): WBC, HGB, HCT, PLT in the last 72 hours. No results for input(s): NA, K, CL, CO2, GLUCOSE, BUN, CREATININE, CALCIUM in the last 72 hours.  Intake/Output Summary (Last 24 hours) at 09/25/2018 0838 Last data filed at 09/24/2018 1907 Gross per 24 hour  Intake 240 ml  Output -  Net 240 ml     Physical Exam: Vital Signs Blood pressure 109/63, pulse (!) 59, temperature 98.1 F (36.7 C), temperature source Oral, resp. rate 16, height 5\' 5"  (1.651 m), weight 75.2 kg, SpO2 98 %.   Constitutional: No distress . Vital signs reviewed. HEENT: EOMI, oral membranes moist Neck: supple Cardiovascular: RRR without murmur. No JVD    Respiratory: CTA Bilaterally without wheezes or rales. Normal effort    GI: BS +, non-tender, non-distended  Extremities: No clubbing, cyanosis, or edema Skin: No evidence of breakdown, no evidence of rash Neurologic: Cranial nerves II through XII intact, motor strength is 4/5 in Left deltoid, bicep, tricep, grip, hip flexor, knee extensors, ankle dorsiflexor and plantar flexor, very dysarthric speech 2-/5 R finger flexion and 3- biceps-- 2- hip adduction, 3- knee ext and hip flexion, 0/5 in RIght ankle unchanged Senses pain in right arm and leg Cerebellar exam unable to perform on RIght due to weakness Musculoskeletal:pain with RIght shoujder abd, pain over R AC joint  Psych: pleasant, cooperative  Assessment/Plan: 1. Functional deficits secondary to RIght hemiparesis and aphasia which require 3+ hours per day of interdisciplinary therapy in a comprehensive inpatient rehab setting.  Physiatrist is providing close team supervision and 24 hour management of active medical problems listed  below.  Physiatrist and rehab team continue to assess barriers to discharge/monitor patient progress toward functional and medical goals  Care Tool:  Bathing    Body parts bathed by patient: Right arm, Chest, Abdomen, Front perineal area, Buttocks, Right upper leg, Left upper leg, Face, Left lower leg, Left arm, Right lower leg   Body parts bathed by helper: Left arm, Right lower leg     Bathing assist Assist Level: Minimal Assistance - Patient > 75%     Upper Body Dressing/Undressing Upper body dressing   What is the patient wearing?: Pull over shirt    Upper body assist Assist Level: Minimal Assistance - Patient > 75%    Lower Body Dressing/Undressing Lower body dressing      What is the patient wearing?: Pants     Lower body assist Assist for lower body dressing: Minimal Assistance - Patient > 75%     Toileting Toileting    Toileting assist Assist for toileting: Moderate Assistance - Patient 50 - 74%     Transfers Chair/bed transfer  Transfers assist     Chair/bed transfer assist level: Moderate Assistance - Patient 50 - 74%     Locomotion Ambulation   Ambulation assist   Ambulation activity did not occur: Safety/medical concerns  Assist level: Minimal Assistance - Patient > 75% Assistive device: Cane-quad Max distance: 40 ft    Walk 10 feet activity   Assist     Assist level: Minimal Assistance - Patient > 75% Assistive device: Cane-quad   Walk 50 feet activity   Assist Walk 50 feet with 2 turns activity did not occur: Safety/medical concerns  Assist  level: Minimal Assistance - Patient > 75% Assistive device: Walker-rolling, Orthosis    Walk 150 feet activity   Assist Walk 150 feet activity did not occur: Safety/medical concerns  Assist level: 2 helpers Assistive device: Lite Gait    Walk 10 feet on uneven surface  activity   Assist Walk 10 feet on uneven surfaces activity did not occur: Safety/medical concerns          Wheelchair     Assist Will patient use wheelchair at discharge?: Yes Type of Wheelchair: Manual    Wheelchair assist level: Supervision/Verbal cueing Max wheelchair distance: 150'    Wheelchair 50 feet with 2 turns activity    Assist    Wheelchair 50 feet with 2 turns activity did not occur: Safety/medical concerns   Assist Level: Supervision/Verbal cueing   Wheelchair 150 feet activity     Assist Wheelchair 150 feet activity did not occur: Safety/medical concerns   Assist Level: Supervision/Verbal cueing     Medical Problem List and Plan: 1.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 DVT Prophylaxis/Anticoagulation: Venous Dopplers lower extremity negative.Subcutaneous Lovenox for DVT prophylaxis 3. Pain Management:Tylenol as needed.Right wrist sprain plus evidence of OA   Overall improving x-ray show no fracture on repeat will add Voltaren gel continue using as needed splint Glenohumeral osteoarthritis with probable loose cartilage in joint , improved after corticosteroid injection  4. Mood:Prozac 20 mg daily. Provide ego support , discussed recovery process and that it is not always linear but may come in spurts, , pt feels discourged , ask neuropsych to see 5. Neuropsych: This patientiscapable of making decisions on hisown behalf. 6. Skin/Wound Care:Routine skin checks 7. Fluids/Electrolytes/Nutrition:Routine in and out's with follow-up chemistries Needs to increase fiber 8. Dysphagia. Dysphagia #2 THIN liquids- BMET normal, tolerating current diet-  MBS result reviewed, tolerating thin liquids 9. Hyperlipidemia. Lipitor 10. Permissive hypertension. Patient on no antihypertensive medications on admission Vitals:   09/24/18 1959 09/25/18 0509   BP: 112/73 109/63  Pulse: 61 (!) 59  Resp: 16 16  Temp: 97.9 F (36.6 C) 98.1 F (36.7 C)  SpO2: 97% 98%  -good control 3/2, no meds 11. BPH. Uroxatral 10 mg 3 times a day once per day on Monday Wednesday Friday. Check PVRs 12. Possible right wrist fracture/Triquetrum.. Conservative care. Follow-up orthopedic service Dr. Stann Mainland. Weightbearing as tolerated with splint in place, will order Xray in ~1wk - no pain with gentle ROM of RIght wrist  13.  Constipation-cont meds as below   With some hemorrhoid issues, anusol supp   senna 3 BID recommend high-fiber diet, changed  miralax to BID- slightly improved LOS: 20 days A FACE TO FACE EVALUATION WAS PERFORMED  [name] 09/25/2018, 8:38 AM

## 2018-09-25 NOTE — Progress Notes (Signed)
Speech Language Pathology Daily Session Note  Patient Details  Name: Edward Glass MRN: [id number] Date of Birth: [birth year]  Today's Date: 09/25/2018 SLP Individual Time: 1400-1500 SLP Individual Time Calculation (min): 60 min  Short Term Goals: Week 3: SLP Short Term Goal 1 (Week 3): Patient will utilize speech intelligibility strategies to maximize intelligibility to ~80% at the phrase level with Supervision A verbal cues.  SLP Short Term Goal 2 (Week 3): Patient will demonstrate selective attention to functional tasks for ~45 minutes with Mod I verbal cues for redirection.  SLP Short Term Goal 3 (Week 3): Patient will express basic wants/needs at the sentence level with Supervision multimodal cues.  SLP Short Term Goal 4 (Week 3): Patient will consume upgraded diet of Dys. 3 textures with thin liquids with minimal overt s/s of aspiration and supervision verbal cues for use of swallowing strategies.  SLP Short Term Goal 5 (Week 3): Patient will demonstrate functional problem solving for basic and familiar tasks with Min A verbal cues.   Skilled Therapeutic Interventions: Skilled treatment session focused on speech goals. SLP facilitated session by providing supervision level verbal cues for use of an increased vocal intensity to achieve ~90% intelligibility at the conversation level in a moderately noisy enviornment.  Overall, patient with improved vocal intensity and intelligibility today which patient contributes to increased overall confidence. Patient continent of urine and transferred back to bed at end of session. Patient left supine with alarm on and all needs within reach. Continue with current plan of care.   Pain No/Denies Pain   Therapy/Group: Individual Therapy  Kaylin Schellenberg 09/25/2018, 3:30 PM

## 2018-09-26 ENCOUNTER — Inpatient Hospital Stay (HOSPITAL_COMMUNITY): Payer: BLUE CROSS/BLUE SHIELD

## 2018-09-26 ENCOUNTER — Inpatient Hospital Stay (HOSPITAL_COMMUNITY): Payer: BLUE CROSS/BLUE SHIELD | Admitting: Speech Pathology

## 2018-09-26 ENCOUNTER — Inpatient Hospital Stay (HOSPITAL_COMMUNITY): Payer: BLUE CROSS/BLUE SHIELD | Admitting: Physical Therapy

## 2018-09-26 NOTE — Plan of Care (Signed)
  Problem: Consults Goal: RH STROKE PATIENT EDUCATION Description See Patient Education module for education specifics  Outcome: Progressing   Problem: RH SKIN INTEGRITY Goal: RH STG SKIN FREE OF INFECTION/BREAKDOWN Description No new breakdown with min assist   Outcome: Progressing   Problem: RH SAFETY Goal: RH STG ADHERE TO SAFETY PRECAUTIONS W/ASSISTANCE/DEVICE Description STG Adhere to Safety Precautions With min Assistance/Device.  Outcome: Progressing Flowsheets (Taken 09/26/2018 1545) STG:Pt will adhere to safety precautions with assistance/device: 3-Moderate assistance   Problem: RH PAIN MANAGEMENT Goal: RH STG PAIN MANAGED AT OR BELOW PT'S PAIN GOAL Description < 3 out of 10   Outcome: Progressing

## 2018-09-26 NOTE — Progress Notes (Signed)
Occupational Therapy Session Note  Patient Details  Name: Edward Glass MRN: [id number] Date of Birth: 03-07-1956  Today's Date: 09/26/2018 OT Individual Time: 3329-5188 OT Individual Time Calculation (min): 75 min    Short Term Goals: Week 3:  OT Short Term Goal 1 (Week 3): Pt will don socks with min A OT Short Term Goal 2 (Week 3): Pt will perform toileting tasks with mod A OT Short Term Goal 3 (Week 3): Pt will use his RUE as stabilizer with mod A OT Short Term Goal 4 (Week 3): Pt weill perform toilet transfers with CGA and min verbal cues for sequencing/safety  Skilled Therapeutic Interventions/Progress Updates:    OT intervention with focus on funcitonal amb with RW, BADL retraining, RUE use, sit<>stand, standing balance, activity tolerance, and safety awareness to increase independence with BADLs. After donning [name] AFO, pt amb with RW to bathroom for shower.  Pt required min A for amb into bathroom.  Pt completed bathing tasks with CGA/min A using long handle sponge for BLE. Pt completed LB dressing tasks with CGA and assistance to don AFO.  Pt required min A for UB dressing tasks.  Pt completed self feeding tasks with setup (assistance to open condiment containers). Pt continues to c/o R wrist discomfort. Pt remained in w/c with all needs within reach and belt alarm activated.   Therapy Documentation Precautions:  Precautions Precautions: Fall Precaution Comments: R hemiplegia; wrist in splint due to fx; shoulder pain Restrictions Weight Bearing Restrictions: No  Pain:   Pt c/o increased R wrist tenderness; wrist splint reapplied after shower, emotional support provided   Therapy/Group: Individual Therapy  [name] 09/26/2018, 8:17 AM

## 2018-09-26 NOTE — Progress Notes (Signed)
[place] PHYSICAL MEDICINE & REHABILITATION PROGRESS NOTE   Subjective/Complaints:  No BM x 2 d No abd pain, hungry and requesting larger portions  ROS: limited due to language/communication   Objective:   No results found. No results for input(s): WBC, HGB, HCT, PLT in the last 72 hours. No results for input(s): NA, K, CL, CO2, GLUCOSE, BUN, CREATININE, CALCIUM in the last 72 hours.  Intake/Output Summary (Last 24 hours) at 09/26/2018 0833 Last data filed at 09/25/2018 1900 Gross per 24 hour  Intake 480 ml  Output -  Net 480 ml     Physical Exam: Vital Signs Blood pressure 112/70, pulse (!) 56, temperature 98.2 F (36.8 C), temperature source Oral, resp. rate 19, height 5\' 5"  (1.651 m), weight 75.2 kg, SpO2 97 %.   Constitutional: No distress . Vital signs reviewed. HEENT: EOMI, oral membranes moist Neck: supple Cardiovascular: RRR without murmur. No JVD    Respiratory: CTA Bilaterally without wheezes or rales. Normal effort    GI: BS +, non-tender, non-distended  Extremities: No clubbing, cyanosis, or edema Skin: No evidence of breakdown, no evidence of rash Neurologic: Cranial nerves II through XII intact, motor strength is 4/5 in Left deltoid, bicep, tricep, grip, hip flexor, knee extensors, ankle dorsiflexor and plantar flexor, very dysarthric speech 2-/5 R finger flexion and 3- biceps-- 2- hip adduction, 3- knee ext and hip flexion, 0/5 in RIght ankle unchanged Senses pain in right arm and leg Cerebellar exam unable to perform on RIght due to weakness Musculoskeletal:pain with RIght shoujder abd, pain over R AC joint  Psych: pleasant, cooperative  Assessment/Plan: 1. Functional deficits secondary to RIght hemiparesis and aphasia which require 3+ hours per day of interdisciplinary therapy in a comprehensive inpatient rehab setting.  Physiatrist is providing close team supervision and 24 hour management of active medical problems listed below.  Physiatrist and  rehab team continue to assess barriers to discharge/monitor patient progress toward functional and medical goals  Care Tool:  Bathing    Body parts bathed by patient: Right arm, Chest, Abdomen, Front perineal area, Buttocks, Right upper leg, Left upper leg, Face, Left lower leg, Left arm, Right lower leg   Body parts bathed by helper: Left arm, Right lower leg     Bathing assist Assist Level: Contact Guard/Touching assist     Upper Body Dressing/Undressing Upper body dressing   What is the patient wearing?: Pull over shirt    Upper body assist Assist Level: Minimal Assistance - Patient > 75%    Lower Body Dressing/Undressing Lower body dressing      What is the patient wearing?: Underwear/pull up, Pants     Lower body assist Assist for lower body dressing: Contact Guard/Touching assist     Toileting Toileting    Toileting assist Assist for toileting: Moderate Assistance - Patient 50 - 74%     Transfers Chair/bed transfer  Transfers assist     Chair/bed transfer assist level: Moderate Assistance - Patient 50 - 74%     Locomotion Ambulation   Ambulation assist   Ambulation activity did not occur: Safety/medical concerns  Assist level: Minimal Assistance - Patient > 75% Assistive device: Cane-quad(R AFO) Max distance: 100 ft    Walk 10 feet activity   Assist     Assist level: Minimal Assistance - Patient > 75% Assistive device: Cane-quad(R AFO)   Walk 50 feet activity   Assist Walk 50 feet with 2 turns activity did not occur: Safety/medical concerns  Assist level: Minimal Assistance -  Patient > 75% Assistive device: Walker-rolling, Orthosis(R AFO)    Walk 150 feet activity   Assist Walk 150 feet activity did not occur: Safety/medical concerns  Assist level: 2 helpers Assistive device: Lite Gait    Walk 10 feet on uneven surface  activity   Assist Walk 10 feet on uneven surfaces activity did not occur: Safety/medical concerns          Wheelchair     Assist Will patient use wheelchair at discharge?: Yes Type of Wheelchair: Manual    Wheelchair assist level: Supervision/Verbal cueing Max wheelchair distance: 125 ft     Wheelchair 50 feet with 2 turns activity    Assist    Wheelchair 50 feet with 2 turns activity did not occur: Safety/medical concerns   Assist Level: Supervision/Verbal cueing   Wheelchair 150 feet activity     Assist Wheelchair 150 feet activity did not occur: Safety/medical concerns   Assist Level: Supervision/Verbal cueing     Medical Problem List and Plan: 1.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 DVT Prophylaxis/Anticoagulation: Venous Dopplers lower extremity negative.Subcutaneous Lovenox for DVT prophylaxis 3. Pain Management:Tylenol as needed.Right wrist sprain plus evidence of OA   Overall improving x-ray show no fracture on repeat will add Voltaren gel continue using as needed splint Glenohumeral osteoarthritis with probable loose cartilage in joint , improved after corticosteroid injection  4. Mood:Prozac 20 mg daily. Provide ego support , discussed recovery process and that it is not always linear but may come in spurts, , pt feels discourged , ask neuropsych to see 5. Neuropsych: This patientiscapable of making decisions on hisown behalf. 6. Skin/Wound Care:Routine skin checks 7. Fluids/Electrolytes/Nutrition:Routine in and out's with follow-up chemistries Needs to increase fiber 8. Dysphagia. Dysphagia #2 THIN liquids- BMET normal, tolerating current diet-  MBS result reviewed, tolerating thin liquids 9. Hyperlipidemia. Lipitor 10. No hx HTN but will cont to monitor BID Vitals:   09/25/18 1943 09/26/18 0355  BP: 124/80 112/70   Pulse: 63 (!) 56  Resp: 18 19  Temp: 98.3 F (36.8 C) 98.2 F (36.8 C)  SpO2: 97% 97%  -good control 3/3, no meds 11. BPH. Uroxatral 10 mg 3 times a day once per day on Monday Wednesday Friday. Check PVRs 12. Possible right wrist fracture/Triquetrum.. Conservative care. Follow-up orthopedic service Dr. Stann Mainland. Weightbearing as tolerated with splint in place, will order Xray in ~1wk - no pain with gentle ROM of RIght wrist  13.  Constipation-cont meds as below   With some hemorrhoid issues, anusol supp   senna 3 BID recommend high-fiber diet, changed  miralax to BID- slightly improved May need dulc supp in am if no BM LOS: 21 days A FACE TO [place] E Kisa Fujii 09/26/2018, 8:33 AM

## 2018-09-26 NOTE — Progress Notes (Signed)
Physical Therapy Session Note  Patient Details  Name: Edward Glass MRN: [id number] Date of Birth: 1956-01-03  Today's Date: 09/26/2018 PT Individual Time: 1045-1155 PT Individual Total Time: 70 min  Short Term Goals: Week 3:  PT Short Term Goal 1 (Week 3): =LTGs due to ELOS  Skilled Therapeutic Interventions/Progress Updates:   Pt in supine and agreeable to therapy, no c/o pain. Transferred to EOB w/ min assist and to w/c w/ CGA. Total assist w/c transport to therapy gym. Worked on functional mobility this session and gait in household environment w/ QC. Ambulated around cones w/ CGA and verbal cues for gait pattern. Negotiated 4 steps w/ unilateral rail w/ CGA. Increased R knee hyperextension w/ stair negotiation, able to self-correct. Discussed benefits of giv-mohr sling for increased RUE proprioception and for R shoulder pain management. Pt states he does feel better w/ sling on vs with sling off in R shoulder. Will provide pt and family w/ handout on how to get sling if they would like to pursue using it w/ mobility at d/c, understand it would not be covered by insurance. Practiced functional transfers to/from couch and bed. Min assist needed sit>stand from lower couch surface. CGA for stand pivot transfers in both directions. NuStep 5 min @ level 4 for LE strengthening and [name] muscle activation, using BLEs only. Pt self-propelled w/c back to room via L hemi technique. Ended session in w/c, all needs in reach.   Therapy Documentation Precautions:  Precautions Precautions: Fall Precaution Comments: R hemiplegia; wrist in splint due to fx; shoulder pain Restrictions Weight Bearing Restrictions: No Vital Signs: Therapy Vitals Temp: 98 F (36.7 C) Pulse Rate: 61 Resp: 18 BP: 122/70 Patient Position (if appropriate): Sitting Oxygen Therapy SpO2: 99 % O2 Device: Room Air  Therapy/Group: Individual Therapy  Khalil Szczepanik [name] 09/26/2018, 5:47 PM

## 2018-09-26 NOTE — Progress Notes (Signed)
Speech Language Pathology Daily Session Note  Patient Details  Name: ARPAN ESKELSON MRN: [id number] Date of Birth: March 25, 1956  Today's Date: 09/26/2018 SLP Individual Time: 1300-1355 SLP Individual Time Calculation (min): 55 min  Short Term Goals: Week 3: SLP Short Term Goal 1 (Week 3): Patient will utilize speech intelligibility strategies to maximize intelligibility to ~80% at the phrase level with Supervision A verbal cues.  SLP Short Term Goal 2 (Week 3): Patient will demonstrate selective attention to functional tasks for ~45 minutes with Mod I verbal cues for redirection.  SLP Short Term Goal 3 (Week 3): Patient will express basic wants/needs at the sentence level with Supervision multimodal cues.  SLP Short Term Goal 4 (Week 3): Patient will consume upgraded diet of Dys. 3 textures with thin liquids with minimal overt s/s of aspiration and supervision verbal cues for use of swallowing strategies.  SLP Short Term Goal 5 (Week 3): Patient will demonstrate functional problem solving for basic and familiar tasks with Min A verbal cues.   Skilled Therapeutic Interventions: Skilled treatment session focused on dysphagia and speech goals. SLP facilitated session by providing skilled observation with trials of regular textures. Patient demonstrated efficient mastication with complete oral clearance, therefore, recommend trial tray prior to upgrade. Patient was also ~90% intelligibile at the sentence level with supervision verbal cues. Patient left upright in wheelchair with alarm on and all needs within reach. Continue with current plan of care.      Pain No/Denies Pain   Therapy/Group: Individual Therapy  Treyveon Mochizuki 09/26/2018, 3:51 PM

## 2018-09-27 ENCOUNTER — Inpatient Hospital Stay (HOSPITAL_COMMUNITY): Payer: BLUE CROSS/BLUE SHIELD | Admitting: Physical Therapy

## 2018-09-27 ENCOUNTER — Inpatient Hospital Stay (HOSPITAL_COMMUNITY): Payer: BLUE CROSS/BLUE SHIELD | Admitting: Speech Pathology

## 2018-09-27 ENCOUNTER — Inpatient Hospital Stay (HOSPITAL_COMMUNITY): Payer: BLUE CROSS/BLUE SHIELD

## 2018-09-27 NOTE — Progress Notes (Signed)
Speech Language Pathology Daily Session Note  Patient Details  Name: KUTLER VANVRANKEN MRN: [id number] Date of Birth: 24-May-1956  Today's Date: 09/27/2018 SLP Individual Time: 2902-1115 SLP Individual Time Calculation (min): 55 min  Short Term Goals: Week 3: SLP Short Term Goal 1 (Week 3): Patient will utilize speech intelligibility strategies to maximize intelligibility to ~80% at the phrase level with Supervision A verbal cues.  SLP Short Term Goal 2 (Week 3): Patient will demonstrate selective attention to functional tasks for ~45 minutes with Mod I verbal cues for redirection.  SLP Short Term Goal 3 (Week 3): Patient will express basic wants/needs at the sentence level with Supervision multimodal cues.  SLP Short Term Goal 4 (Week 3): Patient will consume upgraded diet of Dys. 3 textures with thin liquids with minimal overt s/s of aspiration and supervision verbal cues for use of swallowing strategies.  SLP Short Term Goal 5 (Week 3): Patient will demonstrate functional problem solving for basic and familiar tasks with Min A verbal cues.   Skilled Therapeutic Interventions: Skilled treatment session focused on dysphagia and cognitive goals. SLP facilitated session by providing skilled observation with lunch meal of regular textures with thin liquids. Patient consumed meal without overt s/s of aspiration. Therefore, recommend patient upgrade to regular textures but continue full supervision. SLP also facilitated session by administering the MoCA-BLIND due to inability to utilize his RUE for writing. Patient scored 13/22 points with a score of 18 or above considered normal. Patient demonstrates deficits in short-term recall, however, patient demonstrates improved recall on a daily basis for functional information. Also suspect mild language impairment impacted function. Patient left upright in wheelchair with alarm on and all needs within reach. Continue with current plan of care.      Pain Pain  Assessment Pain Scale: 0-10 Pain Score: 0-No pain  Therapy/Group: Individual Therapy  Carola Viramontes 09/27/2018, 3:07 PM

## 2018-09-27 NOTE — Progress Notes (Addendum)
[facility] PHYSICAL MEDICINE & REHABILITATION PROGRESS NOTE   Subjective/Complaints:  RIght wrist pain, we discussed xray results, DJD, nofx  ROS: limited due to language/communication   Objective:   No results found. No results for input(s): WBC, HGB, HCT, PLT in the last 72 hours. No results for input(s): NA, K, CL, CO2, GLUCOSE, BUN, CREATININE, CALCIUM in the last 72 hours.  Intake/Output Summary (Last 24 hours) at 09/27/2018 0855 Last data filed at 09/27/2018 0803 Gross per 24 hour  Intake 720 ml  Output -  Net 720 ml     Physical Exam: Vital Signs Blood pressure 116/70, pulse (!) 55, temperature 97.7 F (36.5 C), temperature source Oral, resp. rate 17, height _0  (1.651 m), weight 75.2 kg, SpO2 97 %.   Constitutional: No distress . Vital signs reviewed. HEENT: EOMI, oral membranes moist Neck: supple Cardiovascular: RRR without murmur. No JVD    Respiratory: CTA Bilaterally without wheezes or rales. Normal effort    GI: BS +, non-tender, non-distended  Extremities: No clubbing, cyanosis, or edema Skin: No evidence of breakdown, no evidence of rash Neurologic: Cranial nerves II through XII intact, motor strength is 4/5 in Left deltoid, bicep, tricep, grip, hip flexor, knee extensors, ankle dorsiflexor and plantar flexor, very dysarthric speech 2-/5 R finger flexion and 3- biceps-- 2- hip adduction, 3- knee ext and hip flexion, 0/5 in RIght ankle unchanged Senses pain in right arm and leg Cerebellar exam unable to perform on RIght due to weakness Musculoskeletal:pain with RIght shoujder abd, pain over R AC joint  Psych: pleasant, cooperative R Wrist without effusion or erythema , mild pain with ROM     Assessment/Plan: 1. Functional deficits secondary to RIght hemiparesis and aphasia which require 3+ hours per day of interdisciplinary therapy in a comprehensive inpatient rehab setting.  Physiatrist is providing close team supervision and 24 hour management of active  medical problems listed below.  Physiatrist and rehab team continue to assess barriers to discharge/monitor patient progress toward functional and medical goals  Care Tool:  Bathing    Body parts bathed by patient: Right arm, Chest, Abdomen, Front perineal area, Buttocks, Right upper leg, Left upper leg, Face, Left lower leg, Left arm, Right lower leg   Body parts bathed by helper: Left arm, Right lower leg     Bathing assist Assist Level: Contact Guard/Touching assist     Upper Body Dressing/Undressing Upper body dressing   What is the patient wearing?: Pull over shirt    Upper body assist Assist Level: Minimal Assistance - Patient > 75%    Lower Body Dressing/Undressing Lower body dressing      What is the patient wearing?: Underwear/pull up, Pants     Lower body assist Assist for lower body dressing: Contact Guard/Touching assist     Toileting Toileting    Toileting assist Assist for toileting: Contact Guard/Touching assist     Transfers Chair/bed transfer  Transfers assist     Chair/bed transfer assist level: Contact Guard/Touching assist     Locomotion Ambulation   Ambulation assist   Ambulation activity did not occur: Safety/medical concerns  Assist level: Contact Guard/Touching assist Assistive device: Cane-quad Max distance: 50'   Walk 10 feet activity   Assist     Assist level: Contact Guard/Touching assist Assistive device: Cane-quad   Walk 50 feet activity   Assist Walk 50 feet with 2 turns activity did not occur: Safety/medical concerns  Assist level: Contact Guard/Touching assist Assistive device: Cane-quad    Walk  150 feet activity   Assist Walk 150 feet activity did not occur: Safety/medical concerns  Assist level: 2 helpers Assistive device: Lite Gait    Walk 10 feet on uneven surface  activity   Assist Walk 10 feet on uneven surfaces activity did not occur: Safety/medical concerns          Wheelchair     Assist Will patient use wheelchair at discharge?: Yes Type of Wheelchair: Manual    Wheelchair assist level: Supervision/Verbal cueing Max wheelchair distance: 125 ft     Wheelchair 50 feet with 2 turns activity    Assist    Wheelchair 50 feet with 2 turns activity did not occur: Safety/medical concerns   Assist Level: Supervision/Verbal cueing   Wheelchair 150 feet activity     Assist Wheelchair 150 feet activity did not occur: Safety/medical concerns   Assist Level: Supervision/Verbal cueing     Medical Problem List and Plan: 1.Right hemiparesis/aphasiasecondary 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 Formulized individual treatment plan based on medical history, underlying problem and comorbidities.Plan D/C 3/6-.2. DVT Prophylaxis/Anticoagulation: Venous Dopplers lower extremity negative.Subcutaneous Lovenox for DVT prophylaxis 3. Pain Management:Tylenol as needed.Right wrist sprain plus evidence of OA   Overall improving x-ray show no fracture on repeat will add Voltaren gel continue using as needed splint Glenohumeral osteoarthritis with probable loose cartilage in joint , improved after corticosteroid injection  4. Mood:Prozac 20 mg daily. Provide ego support , discussed recovery process and that it is not always linear but may come in spurts, , pt feels discourged , ask neuropsych to see 5. Neuropsych: This patientiscapable of making decisions on hisown behalf. 6. Skin/Wound Care:Routine skin checks 7. Fluids/Electrolytes/Nutrition:Routine in and out's with follow-up chemistries Needs to increase fiber 8. Dysphagia. Dysphagia #2  THIN liquids- BMET normal, tolerating current diet-  MBS result reviewed, tolerating thin liquids 9. Hyperlipidemia. Lipitor 10. No hx HTN but will cont to monitor BID Vitals:   09/26/18 1929 09/27/18 0501  BP: 114/63 116/70  Pulse: 60 (!) 55  Resp: 18 17  Temp: 97.8 F (36.6 C) 97.7 F (36.5 C)  SpO2: 96% 97%  -good control 3/4, no meds 11. BPH. Uroxatral 10 mg 3 times a day once per day on Monday Wednesday Friday. Check PVRs 12. right wrist pain likely due to DJD nd immobility, allow out of brace, inject R wrist  . Conservative care. Follow-up orthopedic service Dr. Stann Mainland.  - no pain with gentle ROM of RIght wrist  13.  Constipation-cont meds as below   With some hemorrhoid issues, anusol supp   senna 3 BID recommend high-fiber diet, changed  miralax to BID- slightly improved May need dulc supp in am if no BM  Right wrist injection Indication right wrist pain due to osteoarthritis Pain has persisted despite physical therapy occupational therapy and diclofenac topical gel Discussed risks and benefits of the procedure with the patient, patient elects to proceed. Patient was placed in a seated position with forearm in pronated position The dorsal radiocarpal depression was marked and prepped with Betadine and then entered with a 27-gauge 1/2 inch needle.  1/2 mL of bupivacaine 0.5% and 1/2 mL of Celestone 6 mg/mL were injected.  Patient tolerated procedure well.  Post procedure instruction given to RN  Celestone Lot #8 a HUP [id number] LOS: 22 days A FACE TO [place] E Kiora Hallberg 09/27/2018, 8:55 AM

## 2018-09-27 NOTE — Progress Notes (Signed)
Occupational Therapy Session Note  Patient Details  Name: Edward Glass MRN: [id number] Date of Birth: [birth year]  Today's Date: 09/27/2018 OT Individual Time: 0700-0828 OT Individual Time Calculation (min): 88 min    Short Term Goals: Week 3:  OT Short Term Goal 1 (Week 3): Pt will don socks with min A OT Short Term Goal 2 (Week 3): Pt will perform toileting tasks with mod A OT Short Term Goal 3 (Week 3): Pt will use his RUE as stabilizer with mod A OT Short Term Goal 4 (Week 3): Pt weill perform toilet transfers with CGA and min verbal cues for sequencing/safety  Skilled Therapeutic Interventions/Progress Updates:    OT intervention with focus on functional amb with RW, BADL retraining, sit<>stand, standing balance, and safety awareness to increase independence with BADLs.  Pt amb with SBQC into bathroom for shower (CGA). Pt returned to room for grooming at sink and dressing with sit<>stand from w/c at sink.  Pt continues to require min A for UB dressing tasks 2/2 R shoulder limited AROM/PROM and pt continues to wear tight fitting shirts. Pt able to don socks and shoes without assistance.  Pt requires assistance with setup of AFO. Pt requested use to toilet and amb with SBQC to bathroom for toileting with CGA.  Pt returned to w/c and remained in w/c with all needs within reach and belt alarm activated.   Therapy Documentation Precautions:  Precautions Precautions: Fall Precaution Comments: R hemiplegia; wrist in splint due to fx; shoulder pain Restrictions Weight Bearing Restrictions: No Pain:  Pt c/o increased R wrist pain this morning; wrist splint donned   Therapy/Group: Individual Therapy  [name] 09/27/2018, 8:34 AM

## 2018-09-27 NOTE — Progress Notes (Signed)
Physical Therapy Session Note  Patient Details  Name: Edward Glass MRN: [id number] Date of Birth: 10/29/1955  Today's Date: 09/27/2018 PT Individual Time:  -      Short Term Goals: Week 3:  PT Short Term Goal 1 (Week 3): =LTGs due to ELOS  Skilled Therapeutic Interventions/Progress Updates:    Pt received in w/c, willing to participate in therapy. No pain today. He states that his goals for session are to work on walking. Began session by wheeling w/c out to hall totalA for time management. Sit to stand from w/c with CGA for safety. Pt ambulated 216 ft in hallway using quad cane, CGA, tactile cues for weight shift to L when stepping with R foot. With progression of ambulation, pt able to better achieve a step-through gait pattern. Seated rest break in w/c, sit to stand with CGA. Ambulation 210 ft back to room, with minA for quad control on R side to prevent buckling and hyperextension as pt became tired. Returned to room, and worked on Customer service manager without UE support, CGA for safety, feet together x 30 sec and tandem stance x 30 sec. Pt had more difficulty with the tandem stance, but was able to maintain without LOB. Pt left in w/c in room with needs met, call bell in reach, alarm belt active.  Therapy Documentation Precautions:  Precautions Precautions: Fall Precaution Comments: R wrist in splint PRN for pain Restrictions Weight Bearing Restrictions: No Pain: Pain Assessment Pain Scale: 0-10 Pain Score: 0-No pain    Therapy/Group: Individual Therapy  Ronnell Guadalajara, SPT   Ronnell Guadalajara 09/27/2018, 12:02 PM

## 2018-09-28 ENCOUNTER — Inpatient Hospital Stay (HOSPITAL_COMMUNITY): Payer: BLUE CROSS/BLUE SHIELD | Admitting: Physical Therapy

## 2018-09-28 ENCOUNTER — Inpatient Hospital Stay (HOSPITAL_COMMUNITY): Payer: BLUE CROSS/BLUE SHIELD

## 2018-09-28 ENCOUNTER — Inpatient Hospital Stay (HOSPITAL_COMMUNITY): Payer: BLUE CROSS/BLUE SHIELD | Admitting: Speech Pathology

## 2018-09-28 DIAGNOSIS — R338 Other retention of urine: Secondary | ICD-10-CM

## 2018-09-28 DIAGNOSIS — G8111 Spastic hemiplegia affecting right dominant side: Secondary | ICD-10-CM

## 2018-09-28 DIAGNOSIS — N401 Enlarged prostate with lower urinary tract symptoms: Secondary | ICD-10-CM

## 2018-09-28 DIAGNOSIS — G8191 Hemiplegia, unspecified affecting right dominant side: Secondary | ICD-10-CM

## 2018-09-28 DIAGNOSIS — K5901 Slow transit constipation: Secondary | ICD-10-CM

## 2018-09-28 NOTE — Patient Care Conference (Signed)
Inpatient RehabilitationTeam Conference and Plan of Care Update Date: 09/27/2018   Time: 11:00 AM    Patient Name: Edward Glass      Medical Record Number: [id number]  Date of Birth: 05-13-1956 Sex: Male         Room/Bed: 4W21C/4W21C-01 Payor Info: Payor: [place] / Plan: [employer] OTHER / Product Type: *No Product type* /    Admitting Diagnosis: CVA  Admit Date/Time:  09/05/2018  1:37 PM Admission Comments: No comment available   Primary Diagnosis:  <principal problem not specified> Principal Problem: <principal problem not specified>  Patient Active Problem List   Diagnosis Date Noted  . Chronic right shoulder pain   . Primary osteoarthritis of right shoulder   . Left basal ganglia embolic stroke ([place]) 71/24/5809  . CVA (cerebral vascular accident) ([place]) 08/31/2018  . Elevated aspartate aminotransferase level 06/16/2015  . H/O nutritional disorder 06/16/2015  . Excessive salivation 06/16/2015  . 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 Heart palpitations 06/16/2015  . 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 Screening for prostate cancer 08/21/2014  . Atypical chest pain 08/10/2013  . 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 Alysia Penna Social Worker Present: Alfonse Alpers, LCSW Nurse Present: Rayetta Pigg, RN PT Present: Leavy Cella, PT OT Present: Roanna Epley, [place], OT SLP Present: Weston Anna, SLP PPS Coordinator present : Gunnar Fusi     Current Status/Progress Goal Weekly Team Focus  Medical   Shoulder pain overall improved wrist pain continues, constipation some improvement with medication changes  Due to fall risk, reduce pain in the right upper limb  Right wrist injection with corticosteroid   Bowel/Bladder   continent of bowel & bladder, LBM 09/24/18, hemorrhoids & chronic constipation, has bowel  regimen  remain continent of B/B   monitor & assist as needed   Swallow/Nutrition/ Hydration   Dys. 3 textures with thin liquids, Mod I  Mod I  trial tray of regular textures    ADL's   functional transfers-CGA/close supervisoin; bathing-CGA; UB dressing-min A/CGA; LB dressing-CGA; toileting-CGA  supervision-min assist  family education, functional transfers, education, BADL retraining, activity tolernace, safety awareness   Mobility   CGA, gait with quad cane x50 ft, steps x4 with rail  CGA-supervision  NMR, balance, motor planning, gait.   Communication   Supervision-Min A  Supervision  word-fnding and vocal intensity   Safety/Cognition/ Behavioral Observations  Supervision   Supervision  complex problem solving and awareness   Pain   no c/o pain, has tylenol & muscle rub prn, voltaren gel to the right wrist & hand is scheduled  pain scale <2/10  assess q shift & treat as needed   Skin   no skin issues noted  no new areas of skin break down  assess q shift    Rehab Goals Patient on target to meet rehab goals: Yes Rehab Goals Revised: none *See Care Plan and progress notes for long and short-term goals.     Barriers to Discharge  Current Status/Progress Possible Resolutions Date Resolved   Physician    Other (comments)  Pain symptoms, shoulder better but wrist persists  Good progress towards therapy goals  Plan discharge for this week      Nursing                  PT  OT                  SLP                SW                Discharge Planning/Teaching Needs:  Pt will d/c to his dtr's, [name], home.  She works from home and will be with pt, but other family and friends are available to assist as needed.  Family education is scheduled for 09-28-18.   Team Discussion:  Pt's shoulder is better, but Dr. Letta Pate injected pt's wrist.  He wants pt out of his wrist brace more and for therapists to do gentle range of motion.  Pt with some constipation.  Pt is  making good progress with OT and needs light min A to contact guard for ADLs.  Pt is using quad cane for 50' gait with PT and he can do 4 steps.  ST reported that pt's speech volume is better and they will try regular textures today.  Revisions to Treatment Plan:  none    Continued Need for Acute Rehabilitation Level of Care: The patient requires daily medical management by a physician with specialized training in physical medicine and rehabilitation for the following conditions: Daily direction of a multidisciplinary physical rehabilitation program to ensure safe treatment while eliciting the highest outcome that is of practical value to the patient.: Yes Daily medical management of patient stability for increased activity during participation in an intensive rehabilitation regime.: Yes Daily analysis of laboratory values and/or radiology reports with any subsequent need for medication adjustment of medical intervention for : Neurological problems   I attest that I was present, lead the team conference, and concur with the assessment and plan of the team.   Hafiz Irion, Silvestre Mesi 09/28/2018, 11:03 AM

## 2018-09-28 NOTE — Progress Notes (Signed)
Social Work Patient ID: [name], male   DOB: 24-Dec-1955, 63 y.o.   MRN: [id number]   CSW met with pt and his two dtrs and significant other, Dena, today to update them on team conference discussion and to let them know that pt is still on track to d/c 09-29-18.  Dtrs, brother, and Coralyn Helling were here for family education this morning and they feel prepared to care for pt at home.  CSW ordering DME and arranging f/u therapies.  CSW has assisted pt's dtr, [name], with FMLA paperwork. Pt feels ready to go home.  CSW remains available to assist as needed.

## 2018-09-28 NOTE — Progress Notes (Signed)
Occupational Therapy Discharge Summary  Patient Details  Name: Edward Glass MRN: [id number] Date of Birth: 05-Nov-1955   Patient has met 68 of 14 long term goals due to improved activity tolerance, improved balance, postural control, ability to compensate for deficits, functional use of  RIGHT lower extremity and improved coordination. Pt made good progress with BADLs and functional transfers during this admission.  Pt completes bathing/dressing tasks with CGA/supervision.  Pt performs functional transfers with CGA. Pt continues to exhibit significant R shoulder tone and premorbid pain affecting functional use of RUE during BADLs. Pt's family has actively participated in therapy sessions and provides appropriate supervision/assistance. Pt and family have verbalized understanding of recommendation for 24 hour supervision/assistance. Patient to discharge at overall Min Assist/ CGA level.  Patient's care partner is independent to provide the necessary physical assistance at discharge.    Reasons goals not met: The goal to use his RUE as a gross A was not met due to significant shoulder pain, high tone, limited active movement in arm and the need to use a wrist brace for his R wrist fracture.  Recommendation:  Patient will benefit from ongoing skilled OT services in outpatient setting to continue to advance functional skills in the area of BADL and iADL.  Equipment: tub transfer bench  Reasons for discharge: treatment goals met  Patient/family agrees with progress made and goals achieved: Yes  OT Discharge Perception  Perception: Within Functional Limits Inattention/Neglect: Appears intact Praxis Praxis: Impaired Praxis Impairment Details: Motor planning Praxis-Other Comments: mild motor planing Cognition Overall Cognitive Status: Impaired/Different from baseline Arousal/Alertness: Awake/alert Orientation Level: Oriented X4 Attention: Sustained;Selective Sustained Attention: Appears  intact Selective Attention: Appears intact Memory: Impaired Memory Impairment: Decreased short term memory Awareness: Appears intact Problem Solving: Impaired Safety/Judgment: Appears intact Sensation Sensation Light Touch: Appears Intact Hot/Cold: Appears Intact Proprioception: Appears Intact Stereognosis: Not tested Coordination Gross Motor Movements are Fluid and Coordinated: No Fine Motor Movements are Fluid and Coordinated: No Heel Shin Test: [name] unable Motor  Motor Motor: Hemiplegia;Motor apraxia Motor - Skilled Clinical Observations: R hemiparesis Motor - Discharge Observations: R hemi, UE>LE, apraxia remains but much improved since eval Mobility  Bed Mobility Bed Mobility: Rolling Right;Rolling Left;Supine to Sit;Sit to Supine Rolling Right: Supervision/verbal cueing Rolling Left: Supervision/Verbal cueing Supine to Sit: Supervision/Verbal cueing Sit to Supine: Supervision/Verbal cueing Transfers Sit to Stand: Supervision/Verbal cueing Stand to Sit: Supervision/Verbal cueing  Trunk/Postural Assessment  Cervical Assessment Cervical Assessment: Within Functional Limits Lumbar Assessment Lumbar Assessment: Within Functional Limits Postural Control Postural Control: Deficits on evaluation Protective Responses: delayed, however improved since eval  Balance Static Sitting Balance Static Sitting - Level of Assistance: 5: Stand by assistance Dynamic Sitting Balance Dynamic Sitting - Level of Assistance: 5: Stand by assistance Extremity/Trunk Assessment RUE Assessment RUE Assessment: Exceptions to [facility] Passive Range of Motion (PROM) Comments: shoulder only able to tolerate 0-30 degress of flexion before indicating pain; elbow- WFL RUE Body System: Neuro Brunstrum levels for arm and hand: Arm;Hand Brunstrum level for arm: Stage III Synergy is performed voluntarily Brunstrum level for hand: Stage IV Movements deviating from synergies LUE Assessment LUE Assessment:  Within Functional Limits   [name] 09/28/2018, 2:19 PM

## 2018-09-28 NOTE — Progress Notes (Signed)
[place] PHYSICAL MEDICINE & REHABILITATION PROGRESS NOTE   Subjective/Complaints: Patient seen sitting up in bed this morning, eating breakfast.  Family at bedside.  Patient states she slept well overnight.  ROS: Denies CP, shortness of breath, nausea, vomiting, diarrhea.  Objective:   No results found. No results for input(s): WBC, HGB, HCT, PLT in the last 72 hours. No results for input(s): NA, K, CL, CO2, GLUCOSE, BUN, CREATININE, CALCIUM in the last 72 hours.  Intake/Output Summary (Last 24 hours) at 09/28/2018 1257 Last data filed at 09/27/2018 1400 Gross per 24 hour  Intake 240 ml  Output -  Net 240 ml     Physical Exam: Vital Signs Blood pressure 110/68, pulse (!) 56, temperature (!) 97.5 F (36.4 C), temperature source Oral, resp. rate 15, height 5\' 5"  (1.651 m), weight 75.2 kg, SpO2 97 %.   Constitutional: No distress . Vital signs reviewed. HENT: Normocephalic.  Atraumatic. Eyes: EOMI. No discharge. Cardiovascular: RRR. No JVD. Respiratory: CTA Bilaterally. Normal effort. GI: BS +. Non-distended. Musc: No edema or tenderness in extremities. Neurologic: Alert and oriented x3 Dysarthria Motor: 4+/5 in Left deltoid, bicep, tricep, grip, hip flexor, knee extensors, ankle dorsiflexor and plantar flexor RUE: Shoulder abduction, elbow flexion/extension 3/5, handgrip 0/5 [name]: Hip flexion, knee extension 3/5, ankle dorsiflexion 0/5 Psych: Slowed.  Flat.  Assessment/Plan: 1. Functional deficits secondary to RIght hemiparesis and aphasia which require 3+ hours per day of interdisciplinary therapy in a comprehensive inpatient rehab setting.  Physiatrist is providing close team supervision and 24 hour management of active medical problems listed below.  Physiatrist and rehab team continue to assess barriers to discharge/monitor patient progress toward functional and medical goals  Care Tool:  Bathing    Body parts bathed by patient: Right arm, Chest, Abdomen, Front  perineal area, Buttocks, Right upper leg, Left upper leg, Face, Left lower leg, Left arm, Right lower leg   Body parts bathed by helper: Left arm, Right lower leg     Bathing assist Assist Level: Contact Guard/Touching assist     Upper Body Dressing/Undressing Upper body dressing   What is the patient wearing?: Pull over shirt    Upper body assist Assist Level: Minimal Assistance - Patient > 75%    Lower Body Dressing/Undressing Lower body dressing      What is the patient wearing?: Underwear/pull up, Pants     Lower body assist Assist for lower body dressing: Contact Guard/Touching assist     Toileting Toileting    Toileting assist Assist for toileting: Contact Guard/Touching assist     Transfers Chair/bed transfer  Transfers assist     Chair/bed transfer assist level: Supervision/Verbal cueing     Locomotion Ambulation   Ambulation assist   Ambulation activity did not occur: Safety/medical concerns  Assist level: Contact Guard/Touching assist Assistive device: Cane-quad Max distance: 150'   Walk 10 feet activity   Assist     Assist level: Contact Guard/Touching assist Assistive device: Cane-quad   Walk 50 feet activity   Assist Walk 50 feet with 2 turns activity did not occur: Safety/medical concerns  Assist level: Contact Guard/Touching assist Assistive device: Cane-quad    Walk 150 feet activity   Assist Walk 150 feet activity did not occur: Safety/medical concerns  Assist level: Contact Guard/Touching assist Assistive device: Cane-quad    Walk 10 feet on uneven surface  activity   Assist Walk 10 feet on uneven surfaces activity did not occur: Safety/medical concerns  Wheelchair     Assist Will patient use wheelchair at discharge?: Yes Type of Wheelchair: Manual    Wheelchair assist level: Supervision/Verbal cueing Max wheelchair distance: 150'    Wheelchair 50 feet with 2 turns activity    Assist     Wheelchair 50 feet with 2 turns activity did not occur: Safety/medical concerns   Assist Level: Supervision/Verbal cueing   Wheelchair 150 feet activity     Assist Wheelchair 150 feet activity did not occur: Safety/medical concerns   Assist Level: Supervision/Verbal cueing     Medical Problem List and Plan: 1.Right hemiparesis/aphasiasecondary to 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 DVT Prophylaxis/Anticoagulation: Venous Dopplers lower extremity negative.Subcutaneous Lovenox for DVT prophylaxis 3. Pain Management:Tylenol as needed.Right wrist sprain plus evidence of OA   Overall improving x-ray show no fracture on repeat added Voltaren gel continue using as needed splint  Glenohumeral osteoarthritis with probable loose cartilage in joint , improved after corticosteroid injection  4. Mood:Prozac 20 mg daily. Provide ego support 5. Neuropsych: This patientis?  Fully capable of making decisions on hisown behalf. 6. Skin/Wound Care:Routine skin checks 7. Fluids/Electrolytes/Nutrition:Routine in and out's Needs to increase fiber 8. Dysphagia.  Advance to regular diet thin liquids with full supervision 9. Hyperlipidemia. Lipitor 10. No hx HTN but will cont to monitor BID Vitals:   09/27/18 2001 09/28/18 0533  BP: 133/73 110/68  Pulse: 65 (!) 56  Resp: 18 15  Temp: 97.9 F (36.6 C) (!) 97.5 F (36.4 C)  SpO2: 97% 97%   Controlled on 3/5 11. BPH. Uroxatral 10 mg 3 times a day once per day on Monday Wednesday Friday.  12. right wrist pain likely due to DJD nd immobility, allow out of brace, inject R  wrist    Follow-up orthopedic service Dr. Stann Mainland.    13.  Constipation-cont meds as below   With some hemorrhoid issues, anusol supp     Continue bowel meds  LOS: 23 days A FACE TO FACE EVALUATION WAS PERFORMED  Ankit Lorie Phenix 09/28/2018, 12:57 PM

## 2018-09-28 NOTE — Discharge Summary (Signed)
Physician Discharge Summary  Patient ID: [name] MRN: [id number] DOB/AGE: [birth year] 62 y.o.  Admit date: 09/05/2018 Discharge date: 09/29/2018  Discharge Diagnoses:  Active Problems:   Left basal ganglia embolic stroke (HCC)   Chronic right shoulder pain   Primary osteoarthritis of right shoulder   Slow transit constipation   Benign prostatic hyperplasia with urinary retention   Right hemiparesis (HCC) Permissive hypertension BPH  Discharged Condition: stable  Significant Diagnostic Studies: Ct Angio Head W Or Wo Contrast  Result Date: 08/30/2018 CLINICAL DATA:  Initial evaluation for acute speech difficulty, right facial weakness. EXAM: CT ANGIOGRAPHY HEAD AND NECK CT PERFUSION BRAIN TECHNIQUE: Multidetector CT imaging of the head and neck was performed using the standard protocol during bolus administration of intravenous contrast. Multiplanar CT image reconstructions and MIPs were obtained to evaluate the vascular anatomy. Carotid stenosis measurements (when applicable) are obtained utilizing NASCET criteria, using the distal internal carotid diameter as the denominator. Multiphase CT imaging of the brain was performed following IV bolus contrast injection. Subsequent parametric perfusion maps were calculated using RAPID software. CONTRAST:  176mL ISOVUE-370 IOPAMIDOL (ISOVUE-370) INJECTION 76% COMPARISON:  Prior noncontrast CT from earlier same day. FINDINGS: CTA NECK FINDINGS Aortic arch: Visualized aortic arch of normal caliber with normal branch pattern. No hemodynamically significant stenosis about the origin of the great vessels. Visualized subclavian arteries widely patent. Right carotid system: Right common carotid artery patent from its origin to the bifurcation without stenosis. Minimal mixed plaque about the right bifurcation without stenosis. Short-segment mild stenosis of approximately 20% by NASCET criteria just distally within the proximal right ICA. Right ICA  otherwise widely patent to the skull base without stenosis, dissection, or occlusion. Left carotid system: Left common carotid artery patent from its origin to the bifurcation without stenosis. No significant atheromatous narrowing about the left bifurcation. Left ICA widely patent from the bifurcation to the skull base without stenosis, dissection, or occlusion. Vertebral arteries: Both of the vertebral arteries arise from the subclavian arteries. Vertebral arteries widely patent within the neck without stenosis, dissection, or occlusion. Skeleton: No acute osseous finding. No discrete lytic or blastic osseous lesions. Mild to moderate cervical spondylolysis at C4-5 through C6-7. Prominent right-sided facet arthrosis noted at C3-4. 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 Soft tissues of the neck demonstrate no other acute finding. Upper chest: Layering fluid noted within the upper esophageal lumen. Visualized upper chest demonstrates no other acute finding. Partially visualized lungs are clear. Review of the MIP images confirms the above findings CTA HEAD FINDINGS Anterior circulation: Petrous ICAs widely patent bilaterally. Cavernous and supraclinoid segments widely patent without stenosis. 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 ICA termini well perfused distally. A1 segments patent bilaterally. Right A1 hypoplastic, likely accounting for the dominant left ICA as compared to the right. Normal anterior communicating artery. Anterior cerebral arteries widely patent to their distal aspects without stenosis. Right M1 widely patent without stenosis. Normal right MCA bifurcation. Distal right MCA branches well perfused. Left M1 patent proximally. 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 Upon review of prior MRI from 2010, this is likely chronic in nature. Stenosis  measures approximately 3 mm in length. Left M1 patent distally. Normal left MCA bifurcation. Left MCA branches are opacified distally, although overall attenuated as compared to the right due to the severe M1 stenosis. Posterior circulation: Vertebral arteries patent to the vertebrobasilar junction  without stenosis. Posterior inferior cerebral arteries patent bilaterally. Proximal and mid basilar artery diminutive but widely patent without stenosis. Persistent left-sided trigeminal artery supplies the distal basilar artery which is more robust in caliber. Superior cerebral arteries patent bilaterally. Both of the posterior cerebral arteries primarily supplied via the basilar and are well perfused to their distal aspects. Venous sinuses: Grossly patent, although limited in assessment due to arterial timing the contrast bolus. Anatomic variants: Persistent left trigeminal artery. No intracranial aneurysm. Delayed phase: Not performed. Review of the MIP images confirms the above findings CT Brain Perfusion Findings: CBF (<30%) Volume: 46mL Perfusion (Tmax>6.0s) volume: 17mL Mismatch Volume: 124mL Infarction Location:No core infarct identified by CT perfusion. Large perfusion mismatch throughout the left MCA territory due to the severe left M1 stenosis. IMPRESSION: 1. Negative CTA for large vessel occlusion. No core infarct evident by CT perfusion. 2. Short-segment severe mid left M1 stenosis, likely similar as compared to previous MRI from 11/19/2008. Associated delayed perfusion throughout the left MCA territory distally. 3. No other hemodynamically significant or correctable stenosis within the major arterial vasculature of the head and neck. 4. Persistent left trigeminal artery. Finding discussed with Dr. Leonel Ramsay by telephone at approximately 9:45 p.m. on 08/30/2018. Electronically Signed   By: Jeannine Boga M.D.   On: 08/30/2018 22:59   Dg Chest 2 View  Result Date: 08/30/2018 CLINICAL DATA:   63 year old male with slurred speech and cough today. EXAM: CHEST - 2 VIEW COMPARISON:  08/27/2018 chest radiographs and earlier. FINDINGS: Lower lung volumes on both views. No pneumothorax, pulmonary edema, pleural effusion or consolidation. Mild basilar crowding of markings. Stable cardiac size at the upper limits of normal. Other mediastinal contours are within normal limits. Visualized tracheal air column is within normal limits. No acute osseous abnormality identified. Negative visible bowel gas pattern. Previous ventral abdominal hernia repair with mesh. IMPRESSION: Lower lung volumes, otherwise no acute cardiopulmonary abnormality. Electronically Signed   By: Genevie Ann M.D.   On: 08/30/2018 17:15   Dg Shoulder Right  Result Date: 09/11/2018 CLINICAL DATA:  Right shoulder pain EXAM: RIGHT SHOULDER - 2+ VIEW COMPARISON:  None. FINDINGS: AC joint is intact. Moderate arthritis at the inferior glenohumeral joint with suspicion of multiple calcified loose bodies. No acute fracture or dislocation. IMPRESSION: Moderate arthritis of the glenohumeral joint with suspected multiple calcified loose bodies. Electronically Signed   By: Donavan Foil M.D.   On: 09/11/2018 20:06   Dg Wrist 2 Views Right  Result Date: 09/19/2018 CLINICAL DATA:  63 year old male with a history of right wrist pain for 4 weeks EXAM: RIGHT WRIST - 2 VIEW COMPARISON:  None. FINDINGS: No acute displaced fracture. Degenerative changes. No focal soft tissue swelling. No radiopaque foreign body. IMPRESSION: No acute bony abnormality. Degenerative changes of the carpal bones. Electronically Signed   By: Corrie Mckusick D.O.   On: 09/19/2018 15:56   Ct Head Wo Contrast  Result Date: 09/01/2018 CLINICAL DATA:  Follow-up stroke.  Increased weakness on the right. EXAM: CT HEAD WITHOUT CONTRAST TECHNIQUE: Contiguous axial images were obtained from the base of the skull through the vertex without intravenous contrast. COMPARISON:  Brain MRI from 2 days  ago FINDINGS: Brain: Acute infarct in the left deep watershed/lateral lenticulostriate distribution which has a more confluent low-density appearance than seen on prior diffusion imaging. No new territory involvement. No hemorrhagic conversion. Small remote right parietal cortex infarct. No hydrocephalus or masslike finding Vascular: Atherosclerotic calcification Skull: No acute finding Sinuses/Orbits: Right frontal maxillary and ethmoid  sinusitis, also seen on prior study. IMPRESSION: More confluent acute infarct in the left basal ganglia and corona radiata when compared to diffusion imaging 2 days ago. No new distribution infarct or hemorrhagic conversion. Electronically Signed   By: Monte Fantasia M.D.   On: 09/01/2018 09:17   Ct Head Wo Contrast  Result Date: 08/30/2018 CLINICAL DATA:  63 year old male with abnormal speech onset today. EXAM: CT HEAD WITHOUT CONTRAST TECHNIQUE: Contiguous axial images were obtained from the base of the skull through the vertex without intravenous contrast. COMPARISON:  Brain MRI 11/19/2008. FINDINGS: Brain: A small area of right parietal lobe cortical encephalomalacia is redemonstrated on series 3, image 22. Cerebral volume has not significantly changed. New abnormal hypodensity in the left posterior limb internal capsule and/or lentiform (series 3, image 14). New hypodensity also in the left caudate and anterior lentiform on image 17. No acute intracranial hemorrhage identified. No midline shift, mass effect, or evidence of intracranial mass lesion. No ventriculomegaly. No acute cortically based infarct identified. Incidental tentorial dural calcifications. Vascular: No suspicious intracranial vascular hyperdensity. Skull: Negative. Sinuses/Orbits: Right frontal ethmoid and maxillary sinus disease with fluid levels is new since 2010. The right sphenoid and left paranasal sinuses are spared. Tympanic cavities and mastoids are clear. Other: Mildly Disconjugate gaze, otherwise  negative orbits. Visualized scalp soft tissues are within normal limits. IMPRESSION: 1. 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 2. Right [facility] pattern paranasal sinusitis. Electronically Signed   By: Genevie Ann M.D.   On: 08/30/2018 17:39   Ct Angio Neck W Or Wo Contrast  Result Date: 08/30/2018 CLINICAL DATA:  Initial evaluation for acute speech difficulty, right facial weakness. EXAM: CT ANGIOGRAPHY HEAD AND NECK CT PERFUSION BRAIN TECHNIQUE: Multidetector CT imaging of the head and neck was performed using the standard protocol during bolus administration of intravenous contrast. Multiplanar CT image reconstructions and MIPs were obtained to evaluate the vascular anatomy. Carotid stenosis measurements (when applicable) are obtained utilizing NASCET criteria, using the distal internal carotid diameter as the denominator. Multiphase CT imaging of the brain was performed following IV bolus contrast injection. Subsequent parametric perfusion maps were calculated using RAPID software. CONTRAST:  1104mL ISOVUE-370 IOPAMIDOL (ISOVUE-370) INJECTION 76% COMPARISON:  Prior noncontrast CT from earlier same day. FINDINGS: CTA NECK FINDINGS Aortic arch: Visualized aortic arch of normal caliber with normal branch pattern. No hemodynamically significant stenosis about the origin of the great vessels. Visualized subclavian arteries widely patent. Right carotid system: Right common carotid artery patent from its origin to the bifurcation without stenosis. Minimal mixed plaque about the right bifurcation without stenosis. Short-segment mild stenosis of approximately 20% by NASCET criteria just distally within the proximal right ICA. Right ICA otherwise widely patent to the skull base without stenosis, dissection, or occlusion. Left carotid system: Left common carotid artery patent from its origin to the bifurcation without stenosis. No significant atheromatous narrowing about the left  bifurcation. Left ICA widely patent from the bifurcation to the skull base without stenosis, dissection, or occlusion. Vertebral arteries: Both of the vertebral arteries arise from the subclavian arteries. Vertebral arteries widely patent within the neck without stenosis, dissection, or occlusion. Skeleton: No acute osseous finding. No discrete lytic or blastic osseous lesions. Mild to moderate cervical spondylolysis at C4-5 through C6-7. Prominent right-sided facet arthrosis noted at C3-4. 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 Soft tissues of the neck demonstrate no other acute finding. Upper chest: Layering fluid noted within the upper esophageal lumen.  Visualized upper chest demonstrates no other acute finding. Partially visualized lungs are clear. Review of the MIP images confirms the above findings CTA HEAD FINDINGS Anterior circulation: Petrous ICAs widely patent bilaterally. Cavernous and supraclinoid segments widely patent without stenosis. 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 ICA termini well perfused distally. A1 segments patent bilaterally. Right A1 hypoplastic, likely accounting for the dominant left ICA as compared to the right. Normal anterior communicating artery. Anterior cerebral arteries widely patent to their distal aspects without stenosis. Right M1 widely patent without stenosis. Normal right MCA bifurcation. Distal right MCA branches well perfused. Left M1 patent proximally. 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 Upon review of prior MRI from 2010, this is likely chronic in nature. Stenosis measures approximately 3 mm in length. Left M1 patent distally. Normal left MCA bifurcation. Left MCA branches are opacified distally, although overall attenuated as compared to the right due to the severe M1 stenosis. Posterior circulation:  Vertebral arteries patent to the vertebrobasilar junction without stenosis. Posterior inferior cerebral arteries patent bilaterally. Proximal and mid basilar artery diminutive but widely patent without stenosis. Persistent left-sided trigeminal artery supplies the distal basilar artery which is more robust in caliber. Superior cerebral arteries patent bilaterally. Both of the posterior cerebral arteries primarily supplied via the basilar and are well perfused to their distal aspects. Venous sinuses: Grossly patent, although limited in assessment due to arterial timing the contrast bolus. Anatomic variants: Persistent left trigeminal artery. No intracranial aneurysm. Delayed phase: Not performed. Review of the MIP images confirms the above findings CT Brain Perfusion Findings: CBF (<30%) Volume: 62mL Perfusion (Tmax>6.0s) volume: 132mL Mismatch Volume: 137mL Infarction Location:No core infarct identified by CT perfusion. Large perfusion mismatch throughout the left MCA territory due to the severe left M1 stenosis. IMPRESSION: 1. Negative CTA for large vessel occlusion. No core infarct evident by CT perfusion. 2. Short-segment severe mid left M1 stenosis, likely similar as compared to previous MRI from 11/19/2008. Associated delayed perfusion throughout the left MCA territory distally. 3. No other hemodynamically significant or correctable stenosis within the major arterial vasculature of the head and neck. 4. Persistent left trigeminal artery. Finding discussed with Dr. Leonel Ramsay by telephone at approximately 9:45 p.m. on 08/30/2018. Electronically Signed   By: Jeannine Boga M.D.   On: 08/30/2018 22:59   Mr Brain Wo Contrast  Result Date: 08/30/2018 CLINICAL DATA:  63 y/o  M; right-sided weakness. EXAM: MRI HEAD WITHOUT CONTRAST TECHNIQUE: Multiplanar, multiecho pulse sequences of the brain and surrounding structures were obtained without intravenous contrast. COMPARISON:  08/30/2018 CT head, CT perfusion  head, and CTA head. FINDINGS: Brain: Multiple foci of reduced diffusion are present within the left caudate body, left mid and posterior corona radiata, left posterior limb of internal capsule extending into left putamen, as well as several additional small foci of the temporoparietal periventricular white matter and the left frontal operculum compatible with acute/early subacute infarction. No associated hemorrhage or mass effect. Infarcts are T2 FLAIR hyperintense. No acute hemorrhage. No extra-axial collection, hydrocephalus, mass effect, or herniation. Small chronic infarcts in the right parietal cortex and right occipital periventricular white matter. Mild chronic microvascular ischemic changes of the brain. Mild brain parenchymal volume loss. Vascular: Please refer to CT angiogram of the head and neck. Skull and upper cervical spine: Normal marrow signal. Sinuses/Orbits: Right frontal, ethmoid, and maxillary sinus partial opacification and fluid level. No abnormal  signal of the mastoid air cells. Orbits are unremarkable. Other: None. IMPRESSION: 1. Multiple small foci of acute/early subacute infarction are present in the left MCA distribution concentrated in left basal ganglia inclusive of the corona radiata and posterior limb of internal capsule. No hemorrhage or mass effect. 2. Mild chronic microvascular ischemic changes and volume loss of the brain. Small chronic infarcts in right parietal and occipital lobes. 3. Right frontal, ethmoid, and maxillary sinus disease is a right middle meatus obstructive pattern, direct visualization recommended. These results were called by telephone at the time of interpretation on 08/30/2018 at 10:46 pm to Dr. Leonel Ramsay, who verbally acknowledged these results. Electronically Signed   By: Kristine Garbe M.D.   On: 08/30/2018 22:48   Ct Cerebral Perfusion W Contrast  Result Date: 08/30/2018 CLINICAL DATA:  Initial evaluation for acute speech difficulty, right  facial weakness. EXAM: CT ANGIOGRAPHY HEAD AND NECK CT PERFUSION BRAIN TECHNIQUE: Multidetector CT imaging of the head and neck was performed using the standard protocol during bolus administration of intravenous contrast. Multiplanar CT image reconstructions and MIPs were obtained to evaluate the vascular anatomy. Carotid stenosis measurements (when applicable) are obtained utilizing NASCET criteria, using the distal internal carotid diameter as the denominator. Multiphase CT imaging of the brain was performed following IV bolus contrast injection. Subsequent parametric perfusion maps were calculated using RAPID software. CONTRAST:  158mL ISOVUE-370 IOPAMIDOL (ISOVUE-370) INJECTION 76% COMPARISON:  Prior noncontrast CT from earlier same day. FINDINGS: CTA NECK FINDINGS Aortic arch: Visualized aortic arch of normal caliber with normal branch pattern. No hemodynamically significant stenosis about the origin of the great vessels. Visualized subclavian arteries widely patent. Right carotid system: Right common carotid artery patent from its origin to the bifurcation without stenosis. Minimal mixed plaque about the right bifurcation without stenosis. Short-segment mild stenosis of approximately 20% by NASCET criteria just distally within the proximal right ICA. Right ICA otherwise widely patent to the skull base without stenosis, dissection, or occlusion. Left carotid system: Left common carotid artery patent from its origin to the bifurcation without stenosis. No significant atheromatous narrowing about the left bifurcation. Left ICA widely patent from the bifurcation to the skull base without stenosis, dissection, or occlusion. Vertebral arteries: Both of the vertebral arteries arise from the subclavian arteries. Vertebral arteries widely patent within the neck without stenosis, dissection, or occlusion. Skeleton: No acute osseous finding. No discrete lytic or blastic osseous lesions. Mild to moderate cervical  spondylolysis at C4-5 through C6-7. Prominent right-sided facet arthrosis noted at C3-4. 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 Soft tissues of the neck demonstrate no other acute finding. Upper chest: Layering fluid noted within the upper esophageal lumen. Visualized upper chest demonstrates no other acute finding. Partially visualized lungs are clear. Review of the MIP images confirms the above findings CTA HEAD FINDINGS Anterior circulation: Petrous ICAs widely patent bilaterally. Cavernous and supraclinoid segments widely patent without stenosis. 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 ICA termini well perfused distally. A1 segments patent bilaterally. Right A1 hypoplastic, likely accounting for the dominant left ICA as compared to the right. Normal anterior communicating artery. Anterior cerebral arteries widely patent to their distal aspects without stenosis. Right M1 widely patent without stenosis. Normal right MCA bifurcation. Distal right MCA branches well perfused. Left M1 patent proximally. 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 Upon review of prior MRI from 2010, this is likely  chronic in nature. Stenosis measures approximately 3 mm in length. Left M1 patent distally. Normal left MCA bifurcation. Left MCA branches are opacified distally, although overall attenuated as compared to the right due to the severe M1 stenosis. Posterior circulation: Vertebral arteries patent to the vertebrobasilar junction without stenosis. Posterior inferior cerebral arteries patent bilaterally. Proximal and mid basilar artery diminutive but widely patent without stenosis. Persistent left-sided trigeminal artery supplies the distal basilar artery which is more robust in caliber. Superior cerebral arteries patent bilaterally. Both of the posterior cerebral arteries  primarily supplied via the basilar and are well perfused to their distal aspects. Venous sinuses: Grossly patent, although limited in assessment due to arterial timing the contrast bolus. Anatomic variants: Persistent left trigeminal artery. No intracranial aneurysm. Delayed phase: Not performed. Review of the MIP images confirms the above findings CT Brain Perfusion Findings: CBF (<30%) Volume: 46mL Perfusion (Tmax>6.0s) volume: 196mL Mismatch Volume: 190mL Infarction Location:No core infarct identified by CT perfusion. Large perfusion mismatch throughout the left MCA territory due to the severe left M1 stenosis. IMPRESSION: 1. Negative CTA for large vessel occlusion. No core infarct evident by CT perfusion. 2. Short-segment severe mid left M1 stenosis, likely similar as compared to previous MRI from 11/19/2008. Associated delayed perfusion throughout the left MCA territory distally. 3. No other hemodynamically significant or correctable stenosis within the major arterial vasculature of the head and neck. 4. Persistent left trigeminal artery. Finding discussed with Dr. Leonel Ramsay by telephone at approximately 9:45 p.m. on 08/30/2018. Electronically Signed   By: Jeannine Boga M.D.   On: 08/30/2018 22:59   Dg Hand Complete Right  Result Date: 09/03/2018 CLINICAL DATA:  Pt was a recent admit into the hospital for a stroke. Pt fell 4 days ago and caught himself with his right hand. Pt c/o today of right hand pain and swelling even after the stroke affected the patient's right side. EXAM: RIGHT HAND - COMPLETE 3+ VIEW COMPARISON:  None. FINDINGS: There is subtle density proximal to the triquetrum/the deformed, also identified along the dorsum of the wrist on the LATERAL view. There is significant soft tissue swelling along the dorsum of the hand. IMPRESSION: 1. Soft tissue swelling. 2. Possible fracture of the triquetrum. Electronically Signed   By: Nolon Nations M.D.   On: 09/03/2018 12:49   Dg  Swallowing Func-speech Pathology  Result Date: 09/12/2018 Objective Swallowing Evaluation: Type of Study: MBS-Modified Barium Swallow Study  Patient Details Name: AMEN STASZAK MRN: [id number] Date of Birth: 01/03/1956 Today's Date: 09/12/2018 Time: SLP Start Time (ACUTE ONLY): 0910 -SLP Stop Time (ACUTE ONLY): 00940 SLP Time Calculation (min) (ACUTE ONLY): 30 min Past Medical History: Past Medical History: Diagnosis Date . Atypical chest pain 08/10/2013 . Elevated aspartate aminotransferase level 06/16/2015 . Excessive salivation 06/16/2015 . H/O nutritional disorder 06/16/2015 . Hypogonadism male 08/21/2014 . 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 Screening for prostate cancer 08/21/2014 . 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 HERNIA REPAIR  2006 . left tricep surgery   . LOOP RECORDER INSERTION N/A 09/04/2018  Procedure: LOOP RECORDER INSERTION;  Surgeon: Evans Lance, MD;  Location: [facility];  Service: Cardiovascular;  Laterality: N/A; . TEE WITHOUT CARDIOVERSION N/A 09/04/2018  Procedure: TRANSESOPHAGEAL ECHOCARDIOGRAM (TEE);  Surgeon: Jerline Pain, MD;  Location: [facility];  Service: Cardiovascular;  Laterality: N/A; HPI: Pt is a 63 y.o. male with medical history significant of hypogonadism, possible BPH, who presents with confusion, difficulty speaking, right facial droop. MRI of  08/30/18 revealed multiple small foci of acute/early subacute infarction are present in the left MCA distribution concentrated in left basal ganglia inclusive of the corona radiata and posterior limb of internal capsule. Pt demonstrated increased weakness on 08/31/18. A repeat CT of the head was conducted and it revealed a more confluent acute infarct in the left basal ganglia and corona radiata when compared to diffusion imaging.  Subjective: alert and cooperative but also aphasic Assessment / Plan / Recommendation CHL IP CLINICAL IMPRESSIONS 09/12/2018 Clinical  Impression Patient continues to demonstrate a mild oropharyngeal dysphagia. Oral phase is characterized by reduce bolus cohesion and containment and incomplete mastication of solids prior to them spilling into his pharynx.  Timing of swallow trigger inconsistent but most consistently triggers at the pyriform sinuses resulting in intermittent mild penetration of thin liquids, most of which squeezes back out during the swallow. Mild pharyngeal residue noted with solid textures that clears with multiple swallows and a liquid wash.  Recommend patient upgrade to thin liquids and continue Dys. 2 textures with full supervision to maximize safety and adherence to aspiration precautions.  SLP Visit Diagnosis Dysphagia, oropharyngeal phase (R13.12) Attention and concentration deficit following -- Frontal lobe and executive function deficit following -- Impact on safety and function Mild aspiration risk   CHL IP TREATMENT RECOMMENDATION 09/12/2018 Treatment Recommendations Therapy as outlined in treatment plan below   Prognosis 09/12/2018 Prognosis for Safe Diet Advancement Good Barriers to Reach Goals -- Barriers/Prognosis Comment -- CHL IP DIET RECOMMENDATION 09/12/2018 SLP Diet Recommendations Dysphagia 2 (Fine chop) solids;Thin liquid Liquid Administration via Cup;Straw Medication Administration Crushed with puree Compensations Slow rate;Small sips/bites;Multiple dry swallows after each bite/sip;Minimize environmental distractions Postural Changes Seated upright at 90 degrees   CHL IP OTHER RECOMMENDATIONS 09/12/2018 Recommended Consults -- Oral Care Recommendations Oral care BID Other Recommendations --   CHL IP FOLLOW UP RECOMMENDATIONS 09/12/2018 Follow up Recommendations Inpatient Rehab   CHL IP FREQUENCY AND DURATION 09/12/2018 Speech Therapy Frequency (ACUTE ONLY) min 3x week Treatment Duration 2 weeks      CHL IP ORAL PHASE 09/12/2018 Oral Phase Impaired Oral - Pudding Teaspoon -- Oral - Pudding Cup -- Oral - Honey  Teaspoon -- Oral - Honey Cup -- Oral - Nectar Teaspoon -- Oral - Nectar Cup NT Oral - Nectar Straw NT Oral - Thin Teaspoon Decreased bolus cohesion Oral - Thin Cup Decreased bolus cohesion Oral - Thin Straw Decreased bolus cohesion Oral - Puree Decreased bolus cohesion;Delayed oral transit Oral - Mech Soft Decreased bolus cohesion;Impaired mastication Oral - Regular -- Oral - Multi-Consistency -- Oral - Pill -- Oral Phase - Comment --  CHL IP PHARYNGEAL PHASE 09/12/2018 Pharyngeal Phase Impaired Pharyngeal- Pudding Teaspoon -- Pharyngeal -- Pharyngeal- Pudding Cup -- Pharyngeal -- Pharyngeal- Honey Teaspoon -- Pharyngeal -- Pharyngeal- Honey Cup -- Pharyngeal -- Pharyngeal- Nectar Teaspoon -- Pharyngeal -- Pharyngeal- Nectar Cup NT Pharyngeal -- Pharyngeal- Nectar Straw NT Pharyngeal -- Pharyngeal- Thin Teaspoon Delayed swallow initiation-pyriform sinuses Pharyngeal -- Pharyngeal- Thin Cup Delayed swallow initiation-pyriform sinuses Pharyngeal Material enters airway, remains ABOVE vocal cords then ejected out Pharyngeal- Thin Straw Delayed swallow initiation-pyriform sinuses Pharyngeal Material enters airway, CONTACTS cords and then ejected out Pharyngeal- Puree Delayed swallow initiation-pyriform sinuses Pharyngeal -- Pharyngeal- Mechanical Soft Delayed swallow initiation-pyriform sinuses Pharyngeal -- Pharyngeal- Regular -- Pharyngeal -- Pharyngeal- Multi-consistency -- Pharyngeal -- Pharyngeal- Pill -- Pharyngeal -- Pharyngeal Comment --  CHL IP CERVICAL ESOPHAGEAL PHASE 09/02/2018 Cervical Esophageal Phase Impaired Pudding Teaspoon -- Pudding Cup -- Honey Teaspoon -- Honey Cup --  Nectar Teaspoon -- Nectar Cup Reduced cricopharyngeal relaxation Nectar Straw Reduced cricopharyngeal relaxation Thin Teaspoon -- Thin Cup Reduced cricopharyngeal relaxation Thin Straw Reduced cricopharyngeal relaxation Puree Reduced cricopharyngeal relaxation Mechanical Soft Reduced cricopharyngeal relaxation Regular -- Multi-consistency  -- Pill -- Cervical Esophageal Comment -- PAYNE, COURTNEY 09/12/2018, 2:10 PM    Weston Anna, MA, CCC-SLP [phone]           Vas [place] Transcranial Doppler W Bubbles  Result Date: 09/04/2018  Transcranial Doppler with Bubble Indications: Stroke. Performing Technologist: Maudry Mayhew MHA, RDMS, RVT, RDCS  Examination Guidelines: A complete evaluation includes B-mode imaging, spectral Doppler, color Doppler, and power Doppler as needed of all accessible portions of each vessel. Bilateral testing is considered an integral part of a complete examination. Limited examinations for reoccurring indications may be performed as noted.  Summary:  A vascular evaluation was performed. The right middle cerebral artery was studied. An IV was inserted into the patient's left forearm. Verbal informed consent was obtained.  There is no evidence of HITS (high intensity transient signals) at rest or with Valsalva. Therefore, there is no evidence of PFO (patent foramen ovale). Negative TCD Bubble study *See table(s) above for measurements and observations.  Diagnosing physician: Antony Contras MD Electronically signed by Antony Contras MD on 09/04/2018 at 1:11:51 PM.    Final    Vas [place] Lower Extremity Venous (dvt)  Result Date: 09/01/2018  Lower Venous Study Indications: Stroke.  Performing Technologist: Maudry Mayhew MHA, RDMS, RVT, RDCS  Examination Guidelines: A complete evaluation includes B-mode imaging, spectral Doppler, color Doppler, and power Doppler as needed of all accessible portions of each vessel. Bilateral testing is considered an integral part of a complete examination. Limited examinations for reoccurring indications may be performed as noted.  Right Venous Findings: +---------+---------------+---------+-----------+----------+--------------+          CompressibilityPhasicitySpontaneityPropertiesSummary        +---------+---------------+---------+-----------+----------+--------------+ CFV      Full            Yes      Yes                                 +---------+---------------+---------+-----------+----------+--------------+ SFJ      Full                                                        +---------+---------------+---------+-----------+----------+--------------+ FV Prox  Full                                                        +---------+---------------+---------+-----------+----------+--------------+ FV Mid   Full                                                        +---------+---------------+---------+-----------+----------+--------------+ FV DistalFull                                                        +---------+---------------+---------+-----------+----------+--------------+  PFV      Full                                                        +---------+---------------+---------+-----------+----------+--------------+ POP      Full                    Yes                  Rouleaux flow  +---------+---------------+---------+-----------+----------+--------------+ PTV      Full                                                        +---------+---------------+---------+-----------+----------+--------------+ PERO                                                  Not visualized +---------+---------------+---------+-----------+----------+--------------+  Left Venous Findings: +---------+---------------+---------+-----------+----------+-------+          CompressibilityPhasicitySpontaneityPropertiesSummary +---------+---------------+---------+-----------+----------+-------+ CFV      Full           No       Yes                          +---------+---------------+---------+-----------+----------+-------+ SFJ      Full                                                 +---------+---------------+---------+-----------+----------+-------+ FV Prox  Full                                                  +---------+---------------+---------+-----------+----------+-------+ FV Mid   Full                                                 +---------+---------------+---------+-----------+----------+-------+ FV DistalFull                                                 +---------+---------------+---------+-----------+----------+-------+ PFV      Full                                                 +---------+---------------+---------+-----------+----------+-------+ POP      Full                    Yes                          +---------+---------------+---------+-----------+----------+-------+  PTV      Full                                                 +---------+---------------+---------+-----------+----------+-------+ PERO     Full                                                 +---------+---------------+---------+-----------+----------+-------+    Summary: Right: There is no evidence of deep vein thrombosis in the lower extremity. However, portions of this examination were limited- see technologist comments above. No cystic structure found in the popliteal fossa. Left: There is no evidence of deep vein thrombosis in the lower extremity. However, portions of this examination were limited- see technologist comments above. No cystic structure found in the popliteal fossa.  Pulsatile lower extremity venous flow is suggestive of possible elevated right-sided heart pressure. *See table(s) above for measurements and observations. Electronically signed by Monica Martinez MD on 09/01/2018 at 3:44:34 PM.    Final     Labs:  Basic Metabolic Panel: Latest chemistry with sodium 139, potassium 4.1, chloride 102, BUN 14, creatinine 0.89  CBC: Latus hemoglobin 14.8 hematocrit 45.7 WBC 9.3 hematocrit 45.7  CBG: No results for input(s): GLUCAP in the last 168 hours.  Brief HPI:    Edward Glass is a 63 year old right-handed male history of hypogonadism, BPH. Per chart review he  lives alone 2 level home. Works as a Radio producer. He has supportive family. Presented to 12/13/2018 with right-sided weakness fall and slurred speech. Cranial CT scan showed age indeterminate small vessel ischemia and the left basal ganglia new since 2010. CT perfusion scan as well as CTA of head and neck left M1 near occlusion. MRI confirmed a multi small foci of acute early subacute infarction present in the left MCA distribution concentrated in the left basal ganglia. Patient did not receive TPA. Echocardiogram with ejection fraction of 60% and normal systolic function. Noted on 09/01/2018 patient with increased weakness of the right side and dysarthria with follow-up cranial CT scan showing more confluent acute infarct in the left basal ganglia and corona radiata with compared to diffusion imaging 2 days prior. No new distribution infarction or hemorrhagic conversion. Patient on aspirin and Plavix for CVA prophylaxis. Subcutaneous Lovenox for DVT prophylaxis. Modified barium swallow completed dysphagia #2 nectar thick liquid diet. TEE completed showing ejection fraction of 55% as well as small PFO bubble crossover noted during Valsalva.Loop recorder was placed. Patient with x-rays of right hand after noted recent fall related to CVA showing possible fracture of the triquetrum with conservative care per orthopedic services Dr. Norma Fredrickson and weightbearing as tolerated with splint applied. Therapy evaluations completed with recommendations of physical medicine rehabilitation consult. Patient was admitted for a compress rehabilitation program.  Past medical history see discharge diagnoses.  Social history patient lives alone independent working as a Physiological scientist.  Functional status upon admission to rehabilitation services. Patient required moderate assist ambulating 20 feet 1 person hand held assist moderate assist sit to stand. Max assist stand pivot transfers. ADLs moderate to max assist upper  body lower body  Physical exam.blood pressure 134/77 pulse 65 temp 99 8 respirations 18 Constitutional. Patient appeared well-developed in no acute distress. Head was normocephalic  atraumatic. Eyes pupils are equal round and reactive to light EOMs are normal. Neck was supple normal range of motion no tracheal deviation present. No thyromegaly present. Cardiovascular normal rate and rhythm exam reveals no friction rubs no murmur heard. Respiratory. Effort normal no respiratory distress. He has no rails. GI soft exhibits no distention. Is no abnormal tenderness. Musculoskeletal normal range of motion. Gen. No deformity or edema. Neurologic patient is alert expressive aphasia occasional muttering inappropriate word or phrase, fair automatic response. Right central 7 and tongue deviation. Right upper extremity grossly graded 0 out of 5 right lower extremity 1+ to 2 minus hip flexors and knee extension trace ankle dorsi plantar flexion. Left upper extremity was 4 out of 5. He did have some decreased sensation to right upper and lower extremity. DTRs 2+. Palpable pedal pulses. Skin was warm and dry. Patient cooperative during exam.   Hospital Course: OZELL JUHASZ was admitted to rehab 09/05/2018 for inpatient therapies to consist of PT, ST and OT at least three hours five days a week. Past admission physiatrist, therapy team and rehab RN have worked together to provide customized collaborative inpatient rehab. 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 Loop recorder and in place. He remained on aspirin and Plavix 3 months and Plavix alone. Follow-up neurology services. Subcutaneous Lovenox for DVT prophylaxis no bleeding episodes venous Doppler studies negative. Pain management use of Tylenol as well as Voltaren gel to the right wrist. He did have some glenohumeral osteoarthritis with probable loose cartilage in the joint he did receive a steroid injection with good results. His diet  had been advanced to regular consistency. Close monitoring of blood pressure with no current antihypertensive medications. He did have a history of BPH voiding without difficulty maintained on Uroxatral. Pertaining to right wrist pain followed by Dr. Stann Mainland splint in place conservative care. Bouts of constipation resolved with laxative assistance.   Rehab course: During patient's stay in rehab weekly team conferences were held to monitor patient's progress, set goals and discuss barriers to discharge. At admission, patient required contact-guard sit to stand from wheelchair. Ambulates 216 feet in the hallway using quad cane, contact guard, tactile cues for weight shift to the left when stepping with right foot. With progression of ambulation he was able to better achieve a step through ambulation pattern. Seated rest breaks in wheelchair sit to stand contact-guard assist up again working with energy conservation techniques and monitoring for any buckling of the right lower extremity. Activities daily living and homemaking sit to stand standing balance safety awareness to increase independence with ADLs and ambulates with a small base quad cane to the bathroom for shower contact guard. He could return to the room for grooming at sink and dressing with sit to stand from wheelchair to sink. He continues to require minimal assist for upper body dressing tasks. He was able to don his socks and shoes without assistance. He required assistance for setup to use his AFO brace. Family teaching was completed and plan discharge to home  He/  has had improvement in activity tolerance, balance, postural control as well as ability to compensate for deficits. He/She has had improvement in functional use RUE/LUE  and [name]/LLE as well as improvement in awareness       Disposition: Discharge disposition: 01-Home or Self Care     Discharge to home   Diet:Regular consistency  Special Instructions: No driving smoking  or alcohol Aspirin and Plavix 3 months then Plavix alone  Medications  at discharge. Tylenol as needed Uroxatral 10 mg 3 times a day Aspirin 325 mg daily Lipitor 40 mg daily Plavix 75 mg daily Voltaren 2 g 4 times daily to affected area MiraLAX twice daily as needed   Discharge Instructions    Ambulatory referral to Physical Medicine Rehab   Complete by:  As directed    Moderate complexity follow-up 1-2 weeks left corona radiata basal ganglia infarction      Follow-up Information    Kirsteins, Luanna Salk, MD Follow up.   Specialty:  Physical Medicine and Rehabilitation Why:  office to call for appointment Contact information: [place] [address] [phone]        [facility] Follow up.   Specialty:  Neurology Why:  call for appointment follow-up stroke clinic nurse practitioner 4 weeks Contact information: [address] [place] [phone]       Evans Lance, MD Follow up.   Specialty:  Cardiology Why:  call for appointment Contact information: [address] [place] [address] [phone]        Nicholes Stairs, MD Follow up.   Specialty:  Orthopedic Surgery Why:  call for appointment Contact information: [address] [place] [place] [address] [phone]        Marcene Corning, MD. Go on 10/06/2018.   Specialty:  Internal Medicine Why:  @ 10:40 am with Milinda Antis, NP for a hospital follow up visit.  You will see Dr. Grandville Silos to become established as his patient on Dec 22, 2018 at 2:20 pm. Contact information: [place] [address] [phone]           Signed: Lavon Paganini Leon 09/29/2018, 5:50 AM

## 2018-09-28 NOTE — Progress Notes (Signed)
Speech Language Pathology Discharge Summary  Patient Details  Name: Edward Glass MRN: [id number] Date of Birth: 09/24/1955  Today's Date: 09/28/2018 SLP Individual Time: 1000-1050 SLP Individual Time Calculation (min): 50 min   Skilled Therapeutic Interventions:  Skilled treatment session focused on completion of patient and family education with the patient and his family. SLP provided education in regards to patient's current swallowing function, diet recommendations and swallowing compensatory strategies. SLP also provided education in regards to patient's current cognitive-linguistic and speech function and strategies to utilize to maximize safety, intelligibility and word-finding.  All verbalized understanding and handouts also given to reinforce information.   Patient has met 10 of 10 long term goals.  Patient to discharge at overall Supervision level.   Reasons goals not met: N/A   Clinical Impression/Discharge Summary: Patient has made excellent gains and has met 10 of 10 LTGs this admission. Currently, patient is consuming regular textures with thin liquids with minimal overt s/s of aspiration and supervision level verbal cues for use of swallowing compensatory strategies. Patient demonstrates increased verbal expression at the sentence level with only intermittent supervision level verbal cues needed for word-finding and to self-monitor and correct verbal errors. Patient's speech intelligibility continues to be impacted by patient's decreased vocal intensity at times, however, it is much improved requiring supervision A verbal cues at times. Patient also demonstrates improved attention, awareness and problem solving but requires intermittent verbal cues for short-term recall. Patient and family education is complete and patient will discharge home with 24 hour supervision from family. Patient would benefit from f/u SLP services to maximize his cognitive-linguistic function and overall  functional independence.   Care Partner:  Caregiver Able to Provide Assistance: Yes     Recommendation:  24 hour supervision/assistance;Outpatient SLP  Rationale for SLP Follow Up: Reduce caregiver burden;Maximize functional communication;Maximize cognitive function and independence   Equipment: N/A   Reasons for discharge: Discharged from hospital;Treatment goals met   Patient/Family Agrees with Progress Made and Goals Achieved: Yes    ,  09/28/2018, 6:59 AM    
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
## 2018-09-28 NOTE — Progress Notes (Signed)
Physical Therapy Discharge Summary  Patient Details  Name: Edward Glass MRN: [id number] Date of Birth: 1956/02/09  Today's Date: 09/28/2018 PT Individual Time: 1100-1157 PT Individual Time Calculation (min): 57 min   Pt in w/c and agreeable to therapy, no c/o pain. Session focused on family education and discharge planning. Performed w/c mobility, transfers, gait, stair negotiation, and bed mobility as detailed below. Additionally performed car transfer w/ CGA. Pt's daughters performed hands-on training for stairs and car transfer, both performed w/o assist and safely. Daughters have already performed hands-on training for gait and verbalize understanding that he can only ambulate w/ shoes and RAFO donned. Returned to room via w/c, ended session in w/c w/ all needs in reach.   Patient has met 8 of 9 long term goals due to improved activity tolerance, improved balance, improved postural control, increased strength, decreased pain, ability to compensate for deficits, functional use of  right lower extremity and improved coordination.  Patient to discharge at an ambulatory level Min Assist (CGA) and supervision w/c level.  Patient's care partners (daughters and girlfriend) are independent to provide the necessary physical assistance at discharge.  Reasons goals not met: Pt continues to require CGA for dynamic standing balance for safety.   Recommendation:  Patient will benefit from ongoing skilled PT services in outpatient setting to continue to advance safe functional mobility, address ongoing impairments in functional balance, [name] NMR, endurance and [name] strength and muscle activation, and minimize fall risk.  Equipment: w/c and quad cane  Reasons for discharge: treatment goals met and discharge from hospital  Patient/family agrees with progress made and goals achieved: Yes  PT Discharge Precautions/Restrictions Precautions Precautions: Fall Precaution Comments: R wrist in splint PRN for  pain Restrictions Weight Bearing Restrictions: No Vital Signs Therapy Vitals Temp: (!) 97.5 F (36.4 C)(rn notified ) Temp Source: Oral Pulse Rate: (!) 56(rn notified) Resp: 15 BP: 110/68 Patient Position (if appropriate): Lying Oxygen Therapy SpO2: 97 % O2 Device: Room Air Pain Pain Assessment Pain Scale: 0-10 Pain Score: 0-No pain Vision/Perception  Perception Perception: Within Functional Limits Praxis Praxis: Impaired Praxis Impairment Details: Motor planning Praxis-Other Comments: Very mild motor planning/apraxia deficits remain  Cognition Overall Cognitive Status: Impaired/Different from baseline Arousal/Alertness: Awake/alert Orientation Level: Oriented X4 Sustained Attention: Appears intact Selective Attention: Appears intact Memory: Impaired Memory Impairment: Decreased short term memory Awareness: Appears intact Problem Solving: Impaired Problem Solving Impairment: Functional complex Safety/Judgment: Appears intact Sensation Sensation Light Touch: Appears Intact(LEs) Coordination Gross Motor Movements are Fluid and Coordinated: No Fine Motor Movements are Fluid and Coordinated: No Heel Shin Test: [name] unable Motor  Motor Motor: Hemiplegia;Motor apraxia Motor - Discharge Observations: R hemi, UE>LE, apraxia remains but much improved since eval  Mobility Bed Mobility Bed Mobility: Rolling Right;Rolling Left;Supine to Sit;Sit to Supine Rolling Right: Supervision/verbal cueing Rolling Left: Supervision/Verbal cueing Supine to Sit: Supervision/Verbal cueing Sit to Supine: Supervision/Verbal cueing Transfers Transfers: Sit to Stand;Stand Pivot Transfers;Stand to Sit Sit to Stand: Supervision/Verbal cueing Stand to Sit: Supervision/Verbal cueing Stand Pivot Transfers: Supervision/Verbal cueing Stand Pivot Transfer Details: Verbal cues for technique;Verbal cues for precautions/safety Transfer (Assistive device): None Locomotion  Gait Ambulation:  Yes Gait Assistance: Contact Guard/Touching assist Gait Distance (Feet): 150 Feet Assistive device: Small based quad cane Gait Assistance Details: Verbal cues for technique;Verbal cues for precautions/safety;Verbal cues for gait pattern;Manual facilitation for weight shifting Gait Gait: Yes Gait Pattern: Impaired Gait Pattern: Decreased dorsiflexion - right;Decreased hip/knee flexion - right;Decreased weight shift to right;Decreased weight shift to left;Right genu recurvatum;Poor  foot clearance - right Gait velocity: decreased Stairs / Additional Locomotion Stairs: Yes Stairs Assistance: Contact Guard/Touching assist Stair Management Technique: One rail Left Number of Stairs: 4 Height of Stairs: 6 Wheelchair Mobility Wheelchair Mobility: Yes Wheelchair Assistance: Supervision/Verbal cueing Wheelchair Propulsion: Left upper extremity;Left lower extremity Wheelchair Parts Management: Supervision/cueing Distance: 150'  Trunk/Postural Assessment  Cervical Assessment Cervical Assessment: Within Functional Limits Thoracic Assessment Thoracic Assessment: Within Functional Limits Lumbar Assessment Lumbar Assessment: Within Functional Limits Postural Control Postural Control: Deficits on evaluation Protective Responses: delayed, however improved since eval  Balance Balance Balance Assessed: Yes Static Sitting Balance Static Sitting - Level of Assistance: 5: Stand by assistance Dynamic Sitting Balance Dynamic Sitting - Level of Assistance: 5: Stand by assistance Static Standing Balance Static Standing - Level of Assistance: 5: Stand by assistance Dynamic Standing Balance Dynamic Standing - Level of Assistance: 4: Min assist(CGA) Extremity Assessment  [name] Assessment [name] Assessment: Exceptions to [facility] Passive Range of Motion (PROM) Comments: DF limited to neutral  [name] Strength Right Hip Flexion: 1/5 Right Hip Extension: 2/5 Right Knee Flexion: 2+/5 Right Knee Extension:  3+/5 Right Ankle Dorsiflexion: 0/5 Right Ankle Plantar Flexion: 0/5 LLE Assessment LLE Assessment: Within Functional Limits    Ariaunna Longsworth K Dura Mccormack 09/28/2018, 7:58 AM

## 2018-09-29 MED ORDER — CLOPIDOGREL BISULFATE 75 MG PO TABS: 75.0000 mg | ORAL_TABLET | Freq: Every day | ORAL | 0 refills | Status: DC

## 2018-09-29 MED ORDER — ALFUZOSIN HCL ER 10 MG PO TB24: 10.0000 mg | ORAL_TABLET | ORAL | 0 refills | Status: DC

## 2018-09-29 MED ORDER — POLYETHYLENE GLYCOL 3350 17 G PO PACK: 17.0000 g | PACK | Freq: Two times a day (BID) | ORAL | 0 refills | Status: DC

## 2018-09-29 MED ORDER — HYDROCORTISONE ACETATE 25 MG RE SUPP: 25.0000 mg | Freq: Two times a day (BID) | RECTAL | 0 refills | Status: DC

## 2018-09-29 MED ORDER — ACETAMINOPHEN 325 MG PO TABS: 650.0000 mg | ORAL_TABLET | ORAL | Status: AC | PRN

## 2018-09-29 MED ORDER — SENNOSIDES-DOCUSATE SODIUM 8.6-50 MG PO TABS: 3.0000 | ORAL_TABLET | Freq: Two times a day (BID) | ORAL | Status: DC

## 2018-09-29 MED ORDER — DICLOFENAC SODIUM 1 % TD GEL: 2.0000 g | Freq: Four times a day (QID) | TRANSDERMAL | 1 refills | Status: DC

## 2018-09-29 MED ORDER — HYDROCORTISONE ACE-PRAMOXINE 1-1 % RE FOAM: 1.0000 | Freq: Two times a day (BID) | RECTAL | 0 refills | Status: DC

## 2018-09-29 MED ORDER — ATORVASTATIN CALCIUM 40 MG PO TABS: 40.0000 mg | ORAL_TABLET | Freq: Every day | ORAL | 1 refills | Status: DC

## 2018-09-29 NOTE — Progress Notes (Signed)
[facility] PHYSICAL MEDICINE & REHABILITATION PROGRESS NOTE   Subjective/Complaints: Patient seen sitting up in bed eating breakfast this morning.  Family at bedside.  Patient is ready for discharge.  ROS: Denies CP, shortness of breath, nausea, vomiting, diarrhea.  Objective:   No results found. No results for input(s): WBC, HGB, HCT, PLT in the last 72 hours. No results for input(s): NA, K, CL, CO2, GLUCOSE, BUN, CREATININE, CALCIUM in the last 72 hours.  Intake/Output Summary (Last 24 hours) at 09/29/2018 1121 Last data filed at 09/28/2018 1828 Gross per 24 hour  Intake 480 ml  Output -  Net 480 ml     Physical Exam: Vital Signs Blood pressure 110/68, pulse (!) 58, temperature (!) 97.5 F (36.4 C), temperature source Oral, resp. rate 19, height 5\' 5"  (1.651 m), weight 75.2 kg, SpO2 98 %.   Constitutional: No distress . Vital signs reviewed. HENT: Normocephalic.  Atraumatic. Eyes: EOMI. No discharge. Cardiovascular: RRR.  No JVD. Respiratory: CTA bilaterally.  Normal effort. GI: BS +. Non-distended. Musc: No edema or tenderness in extremities. Neurologic: Alert and oriented x3 Dysarthria Motor: 4+/5 in Left deltoid, bicep, tricep, grip, hip flexor, knee extensors, ankle dorsiflexor and plantar flexor, stable  RUE: Shoulder abduction, elbow flexion/extension 3/5, handgrip 0/5, stable [name]: Hip flexion, knee extension 3/5, ankle dorsiflexion 0/5, stable Psych: Slowed.  Flat.  Assessment/Plan: 1. Functional deficits secondary to RIght hemiparesis and aphasia which require 3+ hours per day of interdisciplinary therapy in a comprehensive inpatient rehab setting.  Physiatrist is providing close team supervision and 24 hour management of active medical problems listed below.  Physiatrist and rehab team continue to assess barriers to discharge/monitor patient progress toward functional and medical goals  Care Tool:  Bathing    Body parts bathed by patient: Right arm, Chest,  Abdomen, Front perineal area, Buttocks, Right upper leg, Left upper leg, Face, Left lower leg, Left arm, Right lower leg   Body parts bathed by helper: Left arm, Right lower leg     Bathing assist Assist Level: Contact Guard/Touching assist     Upper Body Dressing/Undressing Upper body dressing   What is the patient wearing?: Pull over shirt    Upper body assist Assist Level: Minimal Assistance - Patient > 75%    Lower Body Dressing/Undressing Lower body dressing      What is the patient wearing?: Underwear/pull up, Pants     Lower body assist Assist for lower body dressing: Contact Guard/Touching assist     Toileting Toileting    Toileting assist Assist for toileting: Contact Guard/Touching assist     Transfers Chair/bed transfer  Transfers assist     Chair/bed transfer assist level: Supervision/Verbal cueing     Locomotion Ambulation   Ambulation assist   Ambulation activity did not occur: Safety/medical concerns  Assist level: Contact Guard/Touching assist Assistive device: Cane-quad Max distance: 150'   Walk 10 feet activity   Assist     Assist level: Contact Guard/Touching assist Assistive device: Cane-quad   Walk 50 feet activity   Assist Walk 50 feet with 2 turns activity did not occur: Safety/medical concerns  Assist level: Contact Guard/Touching assist Assistive device: Cane-quad    Walk 150 feet activity   Assist Walk 150 feet activity did not occur: Safety/medical concerns  Assist level: Contact Guard/Touching assist Assistive device: Cane-quad    Walk 10 feet on uneven surface  activity   Assist Walk 10 feet on uneven surfaces activity did not occur: Safety/medical concerns  Wheelchair     Assist Will patient use wheelchair at discharge?: Yes Type of Wheelchair: Manual    Wheelchair assist level: Supervision/Verbal cueing Max wheelchair distance: 150'    Wheelchair 50 feet with 2 turns  activity    Assist    Wheelchair 50 feet with 2 turns activity did not occur: Safety/medical concerns   Assist Level: Supervision/Verbal cueing   Wheelchair 150 feet activity     Assist Wheelchair 150 feet activity did not occur: Safety/medical concerns   Assist Level: Supervision/Verbal cueing     Medical Problem List and Plan: 1.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 DVT Prophylaxis/Anticoagulation: Venous Dopplers lower extremity negative.Subcutaneous Lovenox for DVT prophylaxis 3. Pain Management:Tylenol as needed.Right wrist sprain plus evidence of OA   Overall improving x-ray show no fracture on repeat added Voltaren gel continue using as needed splint  Glenohumeral osteoarthritis with probable loose cartilage in joint , improved after corticosteroid injection  4. Mood:Prozac 20 mg daily. Provide ego support 5. Neuropsych: This patientis?  Fully capable of making decisions on hisown behalf. 6. Skin/Wound Care:Routine skin checks 7. Fluids/Electrolytes/Nutrition:Routine in and out's Needs to increase fiber 8. Dysphagia.  Advance to regular diet thin liquids with full supervision 9. Hyperlipidemia. Lipitor 10. No hx HTN but will cont to monitor BID Vitals:   09/28/18 1933 09/29/18 0456  BP: 129/75 110/68  Pulse: 62 (!) 58  Resp: 17 19  Temp: 98.2 F (36.8 C) (!) 97.5 F (36.4 C)  SpO2: 96% 98%   Controlled on 3/6 11. BPH. Uroxatral 10 mg 3 times a day once per day on Monday Wednesday Friday.  12. right wrist pain likely due to DJD nd immobility, allow out of brace, injected R wrist    Follow-up orthopedic service Dr. Stann Mainland.    13.  Constipation-cont meds as below   With some  hemorrhoid issues, anusol supp     Continue bowel meds  LOS: 24 days A FACE TO FACE EVALUATION WAS PERFORMED  Edward Glass Lorie Phenix 09/29/2018, 11:21 AM

## 2018-09-29 NOTE — Progress Notes (Signed)
Social Work Discharge Note  The overall goal for the admission was met for:   Discharge location: Yes - to dtr's [name]) home  Length of Stay: Yes - 23 days  Discharge activity level: Yes - minimal assistance  Home/community participation: Yes  Services provided included: MD, RD, PT, OT, SLP, RN, Pharmacy and SW  Financial Services: Private Insurance: [employer]  Follow-up services arranged: Outpatient: [facility]/OT/ST at [facility], DME: small base quad cane; 18'x18' lightweight w/c with basic cushion; tub transfer bench from [place] and Patient/Family has no preference for HH/DME agencies  Comments (or additional information):  Pt going to Rachel's home at d/c where she can provide needed supervision/minimal assistance.  She, pt's other dtr (Tiffany) and significant other (Dena) all received family education.   [place] primary contact/dtr - [phone]  Patient/Family verbalized understanding of follow-up arrangements: Yes  Individual responsible for coordination of the follow-up plan: Pt's dtrs and significant other.  Confirmed correct DME delivered: Trey Sailors 09/29/2018    Amanee Iacovelli, Silvestre Mesi

## 2018-09-29 NOTE — Discharge Instructions (Signed)
Inpatient Rehab Discharge Instructions  Edward Glass Discharge date and time: No discharge date for patient encounter.   Activities/Precautions/ Functional Status: Activity: activity as tolerated Diet:  Wound Care: none needed Functional status:  ___ No restrictions     ___ Walk up steps independently ___ 24/7 supervision/assistance   ___ Walk up steps with assistance ___ Intermittent supervision/assistance  ___ Bathe/dress independently ___ Walk with walker     _x__ Bathe/dress with assistance ___ Walk Independently    ___ Shower independently ___ Walk with assistance    ___ Shower with assistance ___ No alcohol     ___ Return to work/school ________  COMMUNITY REFERRALS UPON DISCHARGE:   Outpatient: PT     OT     ST  Agency:  [facility]                            [address] [place], [place]  [address]  Phone:  [phone]  Appointment Date/Time:  Tuesday, March 10th at 2:00pm (PT) 2:45pm (OT) 3:30pm (ST) Medical Equipment/Items Ordered:  Quad cane; 18"x18" lightweight wheelchair with cushion; tub transfer bench  Agency/Supplier:  [facility]         Phone:  [phone]  GENERAL COMMUNITY RESOURCES FOR PATIENT/FAMILY: Support Groups:  [facility] Stroke Support Group                              Meets the second Thursday of each month from 6 - 7 PM (except June, July, August)                              [facility] [place], [address], [place]                              For more information, call [phone]  Special Instructions: No driving  Aspirin and Plavix 3 months then Plavix alone STROKE/TIA DISCHARGE INSTRUCTIONS SMOKING Cigarette smoking nearly doubles your risk of having a stroke & is the single most alterable risk factor  If you smoke or have smoked in the last 12 months, you are advised to quit smoking for your health.   Most of the excess cardiovascular risk related to smoking disappears within a year of stopping.  Ask you doctor about anti-smoking medications  [place] Quit Line: 1-800-QUIT NOW  Free Smoking Cessation Classes [phone]  CHOLESTEROL Know your levels; limit fat & cholesterol in your diet  Lipid Panel     Component Value Date/Time   CHOL 173 08/31/2018 0447   CHOL 159 08/15/2013 0818   TRIG 86 08/31/2018 0447   TRIG 52 08/15/2013 0818   HDL 52 08/31/2018 0447   HDL 63 08/15/2013 0818   CHOLHDL 3.3 08/31/2018 0447   VLDL 17 08/31/2018 0447   LDLCALC 104 (H) 08/31/2018 0447   LDLCALC 86 08/15/2013 0818      Many patients benefit from treatment even if their cholesterol is at goal.  Goal: Total Cholesterol (CHOL) less than 160  Goal:  Triglycerides (TRIG) less than 150  Goal:  HDL greater  than 40  Goal:  LDL (LDLCALC) less than 100   BLOOD PRESSURE American Stroke Association blood pressure target is less that 120/80 mm/Hg  Your discharge blood pressure is:  BP: 136/81  Monitor your blood pressure  Limit your salt and alcohol intake  Many individuals will require more than one medication for high blood pressure  DIABETES (A1c is a blood sugar average for last 3 months) Goal HGBA1c is under 7% (HBGA1c is blood sugar average for last 3 months)  Diabetes: No known diagnosis of diabetes    Lab Results  Component Value Date   HGBA1C 6.2 (H) 08/31/2018     Your HGBA1c can be lowered with medications, healthy diet, and exercise.  Check your blood sugar as directed by your physician  Call your physician if you experience unexplained or low blood sugars.  PHYSICAL ACTIVITY/REHABILITATION Goal is 30 minutes at least 4 days per week  Activity: Increase activity slowly, Therapies: Physical Therapy: Home Health Return to work:   Activity decreases your risk of heart attack and stroke and makes your heart stronger.  It helps control your weight and blood pressure; helps you  relax and can improve your mood.  Participate in a regular exercise program.  Talk with your doctor about the best form of exercise for you (dancing, walking, swimming, cycling).  DIET/WEIGHT Goal is to maintain a healthy weight  Your discharge diet is:  Diet Order            DIET DYS 2 Room service appropriate? Yes; Fluid consistency: Nectar Thick  Diet effective now              liquids Your height is:    Your current weight is: Weight: 74.7 kg Your Body Mass Index (BMI) is:  BMI (Calculated): 27.4  Following the type of diet specifically designed for you will help prevent another stroke.  Your goal weight range is:    Your goal Body Mass Index (BMI) is 19-24.  Healthy food habits can help reduce 3 risk factors for stroke:  High cholesterol, hypertension, and excess weight.  RESOURCES Stroke/Support Group:  Call [phone]   STROKE EDUCATION PROVIDED/REVIEWED AND GIVEN TO PATIENT Stroke warning signs and symptoms How to activate emergency medical system (call 911). Medications prescribed at discharge. Need for follow-up after discharge. Personal risk factors for stroke. Pneumonia vaccine given:  Flu vaccine given:  My questions have been answered, the writing is legible, and I understand these instructions.  I will adhere to these goals & educational materials that have been provided to me after my discharge from the hospital.      My questions have been answered and I understand these instructions. I will adhere to these goals and the provided educational materials after my discharge from the hospital.  Patient/Caregiver Signature _______________________________ Date __________  Clinician Signature _______________________________________ Date __________  Please bring this form and your medication list with you to all your follow-up doctor's appointments.

## 2018-10-02 ENCOUNTER — Telehealth: Payer: Self-pay

## 2018-10-02 NOTE — Telephone Encounter (Signed)
Called [name] and left message with appt, time and date and address. Also asked for a call back

## 2018-10-03 ENCOUNTER — Encounter: Payer: Self-pay | Admitting: Physical Therapy

## 2018-10-03 ENCOUNTER — Other Ambulatory Visit: Payer: Self-pay

## 2018-10-03 ENCOUNTER — Ambulatory Visit: Payer: BLUE CROSS/BLUE SHIELD | Attending: Physician Assistant | Admitting: Physical Therapy

## 2018-10-03 ENCOUNTER — Ambulatory Visit: Payer: BLUE CROSS/BLUE SHIELD

## 2018-10-03 ENCOUNTER — Ambulatory Visit: Payer: BLUE CROSS/BLUE SHIELD | Admitting: Occupational Therapy

## 2018-10-03 DIAGNOSIS — R471 Dysarthria and anarthria: Secondary | ICD-10-CM | POA: Diagnosis present

## 2018-10-03 DIAGNOSIS — R293 Abnormal posture: Secondary | ICD-10-CM | POA: Insufficient documentation

## 2018-10-03 DIAGNOSIS — R41841 Cognitive communication deficit: Secondary | ICD-10-CM

## 2018-10-03 DIAGNOSIS — M25531 Pain in right wrist: Secondary | ICD-10-CM | POA: Diagnosis present

## 2018-10-03 DIAGNOSIS — M79641 Pain in right hand: Secondary | ICD-10-CM | POA: Insufficient documentation

## 2018-10-03 DIAGNOSIS — R1312 Dysphagia, oropharyngeal phase: Secondary | ICD-10-CM

## 2018-10-03 DIAGNOSIS — R2689 Other abnormalities of gait and mobility: Secondary | ICD-10-CM | POA: Diagnosis not present

## 2018-10-03 DIAGNOSIS — R4701 Aphasia: Secondary | ICD-10-CM

## 2018-10-03 DIAGNOSIS — I69318 Other symptoms and signs involving cognitive functions following cerebral infarction: Secondary | ICD-10-CM | POA: Insufficient documentation

## 2018-10-03 DIAGNOSIS — R29818 Other symptoms and signs involving the nervous system: Secondary | ICD-10-CM

## 2018-10-03 DIAGNOSIS — M25511 Pain in right shoulder: Secondary | ICD-10-CM | POA: Diagnosis present

## 2018-10-03 DIAGNOSIS — R600 Localized edema: Secondary | ICD-10-CM | POA: Insufficient documentation

## 2018-10-03 DIAGNOSIS — I69351 Hemiplegia and hemiparesis following cerebral infarction affecting right dominant side: Secondary | ICD-10-CM

## 2018-10-03 DIAGNOSIS — M6281 Muscle weakness (generalized): Secondary | ICD-10-CM | POA: Diagnosis present

## 2018-10-03 DIAGNOSIS — R2681 Unsteadiness on feet: Secondary | ICD-10-CM

## 2018-10-03 NOTE — Therapy (Signed)
Edward Glass [address] [place], [place], [address] Phone: [phone]   Fax:  [phone]  Physical Therapy Evaluation  Patient Details  Name: Edward Glass MRN: [id number] Date of Birth: 1956-06-30 Referring Provider (PT): Alysia Penna MD   Encounter Date: 10/03/2018  PT End of Session - 10/03/18 2008    Visit Number  1    Number of Visits  17    Date for PT Re-Evaluation  12/02/18    Authorization Type  BCBS; met $7500 deductible; VL: PT/OT 30, zero used; seen on same day =1 visit; SLP 30 visits    PT Start Time  1403    PT Stop Time  1447    PT Time Calculation (min)  44 min    Equipment Utilized During Treatment  Gait belt    Activity Tolerance  Patient tolerated treatment well    Behavior During Therapy  [facility] for tasks assessed/performed       Past Medical History:  Diagnosis Date  . Atypical chest pain 08/10/2013  . Elevated aspartate aminotransferase level 06/16/2015  . Excessive salivation 06/16/2015  . H/O nutritional disorder 06/16/2015  . 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 Screening for prostate cancer 08/21/2014  . 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 HERNIA REPAIR  2006  . left tricep surgery    . LOOP RECORDER INSERTION N/A 09/04/2018   Procedure: LOOP RECORDER INSERTION;  Surgeon: Evans Lance, MD;  Location: [facility];  Service: Cardiovascular;  Laterality: N/A;  . TEE WITHOUT CARDIOVERSION N/A 09/04/2018   Procedure: TRANSESOPHAGEAL ECHOCARDIOGRAM (TEE);  Surgeon: Jerline Pain, MD;  Location: [facility] ENDOSCOPY;  Service: Cardiovascular;  Laterality: N/A;    There were no vitals filed for this visit.   Subjective Assessment - 10/03/18 1417    Subjective  Reports he has problems with rt sided weakness, balance and walking.     Patient is accompained by:  Family member   Edward Glass   Pertinent History   chronic rt shoulder pain; had recent fall with rt wrist pain, xray negative for fracture and had steroid injection 09/27/18    Limitations  Walking    How long can you walk comfortably?  150 ft    Diagnostic tests  MRI-Multiple small foci of acute/early subacute infarctions present in the left MCA distribution concentrated in left basal ganglia; Small chronic infarcts in right parietal and occipital lobes    Patient Stated Goals  improve his walking and balance, be able to live on his own again in his 2 story house    Currently in Pain?  Yes    Pain Score  5     Pain Location  Wrist    Pain Orientation  Right    Pain Descriptors / Indicators  Throbbing    Pain Type  Acute pain    Pain Onset  1 to 4 weeks ago    Pain Frequency  Constant    Aggravating Factors   any movement or touching of right hand/wrist         [facility] PT Assessment - 10/03/18 1410      Assessment   Medical Diagnosis  Left basal ganglia embolic stroke CIR 9/74-1/6    Referring Provider (PT)  Alysia Penna MD    Onset Date/Surgical Date  08/30/18    Hand Dominance  Right    Prior Therapy  CIR 2/11-3/6  Precautions   Precautions  Fall    Required Braces or Orthoses  Other Brace/Splint    Other Brace/Splint  rt carbon-fiber AFO (?[place])      Balance Screen   Has the patient fallen in the past 6 months  Yes    How many times?  1    Has the patient had a decrease in activity level because of a fear of falling?   No    Is the patient reluctant to leave their home because of a fear of falling?   No      Home Environment   Living Environment  Private residence    [place]   living with dtr; normally lives alone   Available Help at Discharge  Family;Available 24 hours/day   Edward Glass, son-in-law, girlfriend   Type of [place] to enter    Entrance Stairs-Number of Steps  3   Edward Glass's house primary info; his home 3 steps, no rails   Entrance Stairs-Rails   Right;Left;Cannot reach both   Edward Glass's; his home no rails   Home Layout  One level   Edward Glass's; his home 2 level with 2 rails and then only 1 ra   [place] - manual;Tub bench;Grab bars - tub/shower   Rt AFO     Prior Function   Level of Independence  Independent    Vocation  Full time employment    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[employer]      Cognition   Overall Cognitive Status  Difficult to assess   defer to SLP; able to follow simple commands   Difficult to assess due to  Impaired communication      Observation/Other Assessments   Observations  Rt hand with significant edema; educated on proper elevation and brief demonstration of retrograde massage    Focus on Therapeutic Outcomes (FOTO)   68; risk adjusted 57; predicted 65      Sensation   Light Touch  Appears Intact   bil LEs   Hot/Cold  --   reports intact     Coordination   Gross Motor Movements are Fluid and Coordinated  No    Fine Motor Movements are Fluid and Coordinated  No    Coordination and Movement Description  coordination impaired secondary to R hemiparesis      Posture/Postural Control   Posture/Postural Control  No significant limitations      Strength   Right Hip Flexion  2+/5    Right Knee Flexion  3-/5    Right Knee Extension  3+/5    Right Ankle Dorsiflexion  0/5    Right Ankle Plantar Flexion  0/5      Transfers   Transfers  Sit to Stand;Stand to Sit    Sit to Stand  4: Min guard;With upper extremity assist;Without upper extremity assist;From chair/3-in-1    Five time sit to stand comments   24.62 no UE assist from w/c; >12 seconds indicative incr fall risk    Stand to Sit  4: Min guard;Without upper extremity assist;With upper extremity assist    Comments  30 sec sit to stand with 6 reps (norm for age 78-19 reps)      Ambulation/Gait   Ambulation/Gait  Yes    Ambulation/Gait Assistance  4: Min guard    Ambulation Distance (Feet)  40 Feet     Assistive device  Small based quad cane    Gait  Pattern  Step-through pattern;Decreased arm swing - right;Decreased step length - left;Decreased stance time - right;Decreased hip/knee flexion - right;Decreased dorsiflexion - right;Decreased weight shift to right;Right flexed knee in stance;Decreased trunk rotation;Poor foot clearance - left;Poor foot clearance - right    Ambulation Surface  Level;Indoor    Gait velocity  10 ft/29.22 sec=0.34 ft/sec; <1.81 ft/sec indicates incr fall risk      Programmer, applications  Other (comment)   dependent (pushed by Edward Glass)   [employer]  Supervision/cueing                Objective measurements completed on examination: See above findings.              PT Education - 10/03/18 2006    Education Details  results of PT eval and PT POC; covered for 30 visits between PT/OT with only 1 visit used if seen by both disciplines on one day. Want to reserve some visits for later in the year    Person(s) Educated  Patient;Child(ren)    Methods  Explanation    Comprehension  Verbalized understanding;Need further instruction       PT Short Term Goals - 10/04/18 4496      PT SHORT TERM GOAL #1   Title  Patient will be independent with HEP (Target all STGs 4 weeks 11/02/2018)      PT SHORT TERM GOAL #2   Title  Patient will improve gait velocity to 0.75 ft/sec demonstrating progress towards decr fall risk    Baseline  3/10 0.34 ft/sec      PT SHORT TERM GOAL #3   Title  Patient will improve 5x sit to stand to <18 seconds demonstrating progress towards decr fall risk    Baseline  3/9  24.62 sec      PT SHORT TERM GOAL #4   Title  Patient will ambulate modified independent over level surfaces with LRAD x 200 ft, including avoiding objects on his right       PT SHORT TERM GOAL #5   Title  Patient will ambulate on ramp and up/down curb with close-guarding and LRAD        PT Long Term Goals - 10/04/18  0735      PT LONG TERM GOAL #1   Title  Patient will be independent with updated HEP and verbalize plan for continued community-based exercise plan (Target all LTGs 8 weeks, 12/02/2018)      PT LONG TERM GOAL #2   Title  Patient will improve gait velocity to 1.30 ft/sec demonstrating progress towards decr fall risk      PT LONG TERM GOAL #3   Title  Patient will improve 5x sit to stand to <12 seconds demonstrating decr fall risk      PT LONG TERM GOAL #4   Title  Patient will ambulate over outdoor paved surfaces modified independent with LRAD, including up/down curb.       PT LONG TERM GOAL #5   Title  Patient will ambulate >300 ft over level indoor surface in <= 6 minutes (6MWT)      Additional Long Term Goals   Additional Long Term Goals  Yes      PT LONG TERM GOAL #6   Title  Patient will go up/down 12 steps with single rail modified independent              Plan - 10/03/18 2010    Clinical Impression Statement  Patient  referred to OPPT s/p acute left multi-infarct CVA in MCA distribution. Patient with resulting hemiplegia effecting his balance and mobility. Per SLP evaluation, he also has cognitive deficits that impact his safety judgement (and pt/Edward Glass admitted he has been getting up to walk by himself despite having 24 hr care and instructed to have someone with him when walking). Patient in very physically active, demanding job PTA and motivated to return to independent status. Patient will benefit from PT to address the mentioned deficits (plus the deficits listed below) via the interventions indicated below.     Personal Factors and Comorbidities  Comorbidity 1    Comorbidities  chronic rt shoulder pain    Examination-Activity Limitations  Bathing;Bed Mobility;Caring for Others;Carry;Dressing;Locomotion Level;Lift;Reach Overhead;Self Feeding;Stairs    Examination-Participation Restrictions  Community Activity;Driving    Stability/Clinical Decision Making   Stable/Uncomplicated    Clinical Decision Making  Low    Rehab Potential  Good    PT Frequency  2x / week    PT Duration  8 weeks    PT Treatment/Interventions  ADLs/Self Care Home Management;Aquatic Therapy;Electrical Stimulation;DME Instruction;Gait training;Stair training;Functional mobility training;Therapeutic activities;Therapeutic exercise;Balance training;Orthotic Fit/Training;Patient/family education;Neuromuscular re-education;Wheelchair mobility training;Passive range of motion;Visual/perceptual remediation/compensation    PT Next Visit Plan  initiate HEP for LE strength, balance; pre-gait and gait training    Consulted and Agree with Plan of Care  Patient;Family member/caregiver    Family Member Consulted  Edward Glass       Patient will benefit from skilled therapeutic intervention in order to improve the following deficits and impairments:  Abnormal gait, Decreased activity tolerance, Decreased balance, Decreased endurance, Decreased coordination, Decreased cognition, Decreased knowledge of use of DME, Decreased mobility, Decreased safety awareness, Difficulty walking, Decreased strength, Increased edema, Impaired perceived functional ability, Impaired UE functional use  Visit Diagnosis: Other abnormalities of gait and mobility - Plan: PT plan of care cert/re-cert  Other symptoms and signs involving the nervous system - Plan: PT plan of care cert/re-cert  Hemiplegia and hemiparesis following cerebral infarction affecting right dominant side ([place]) - Plan: PT plan of care cert/re-cert  Unsteadiness on feet - Plan: PT plan of care cert/re-cert     Problem List Patient Active Problem List   Diagnosis Date Noted  . Slow transit constipation   . Benign prostatic hyperplasia with urinary retention   . Right hemiparesis ([place])   . Chronic right shoulder pain   . Primary osteoarthritis of right shoulder   . Left basal ganglia embolic stroke ([place]) 63/84/5364  . CVA (cerebral vascular  accident) ([place]) 08/31/2018  . Elevated aspartate aminotransferase level 06/16/2015  . H/O nutritional disorder 06/16/2015  . Excessive salivation 06/16/2015  . 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 Heart palpitations 06/16/2015  . 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 Screening for prostate cancer 08/21/2014  . Atypical chest pain 08/10/2013  . 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 [place], [place], [address] Phone: [phone]   Fax:  [phone]  Name: GURFATEH MCCLAIN MRN: [id number] Date of Birth: [birth year]

## 2018-10-03 NOTE — Telephone Encounter (Signed)
Called Edward Glass's number, no answer, left voicemail for her or patient to return call for follow up appointment.

## 2018-10-04 NOTE — Therapy (Signed)
[facility] [address] [place], [place], [address] Phone: [phone]   Fax:  [phone]  Speech Language Pathology Evaluation  Patient Details  Name: Edward Glass MRN: [id number] Date of Birth: Dec 22, 1955 Referring Provider (SLP): Alysia Penna, MD   Encounter Date: 10/03/2018  End of Session - 10/04/18 0930    Visit Number  1    Number of Visits  21    Date for SLP Re-Evaluation  12/27/18    Authorization Type  30 visits ST (20 planned, save 10?)    Authorization - Visit Number  1    Authorization - Number of Visits  30    SLP Start Time  8338    SLP Stop Time   1616    SLP Time Calculation (min)  43 min    Activity Tolerance  Patient tolerated treatment well   lethargic for most of session      Past Medical History:  Diagnosis Date  . Atypical chest pain 08/10/2013  . Elevated aspartate aminotransferase level 06/16/2015  . Excessive salivation 06/16/2015  . H/O nutritional disorder 06/16/2015  . 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 Screening for prostate cancer 08/21/2014  . 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 HERNIA REPAIR  2006  . left tricep surgery    . LOOP RECORDER INSERTION N/A 09/04/2018   Procedure: LOOP RECORDER INSERTION;  Surgeon: Evans Lance, MD;  Location: [facility];  Service: Cardiovascular;  Laterality: N/A;  . TEE WITHOUT CARDIOVERSION N/A 09/04/2018   Procedure: TRANSESOPHAGEAL ECHOCARDIOGRAM (TEE);  Surgeon: Jerline Pain, MD;  Location[facility] ENDOSCOPY;  Service: Cardiovascular;  Laterality: N/A;    There were no vitals filed for this visit.  Subjective Assessment - 10/03/18 1541    Subjective  "She had me talking about general stuff to see what I was aware of."    Patient is accompained by:  Family member   dtr   Currently in Pain?  Yes    Pain Score  4     Pain Location  Arm    Pain  Orientation  Right    Pain Descriptors / Indicators  Throbbing    Pain Type  Acute pain    Pain Onset  1 to 4 weeks ago    Pain Frequency  Constant         SLP Evaluation [facility] - 10/04/18 0001      SLP Visit Information   SLP Received On  10/03/18    Referring Provider (SLP)  Alysia Penna, MD    Onset Date  08-30-18    Medical Diagnosis  CVA      Subjective   Patient/Family Stated Goal  "Get the condensation out of my mouth." Later upon questioning more directly, pt desires to speak at a louder level, when given an option of 3 things on which to focus first (cognition, aphasia, or speech loudness).      Pain Assessment   Currently in Pain?  Yes    Pain Score  4     Pain Location  Arm    Pain Orientation  Right    Pain Type  Acute pain    Pain Onset  1 to 4 weeks ago    Pain Frequency  Constant      General Information   HPI  Pt is a 63 y.o. male with medical history significant of hypogonadism, possible  BPH, who presents with confusion, difficulty speaking, right facial droop. 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 Pt demonstrated increased weakness on 08/31/18. A repeat CT of the head was conducted and it revealed a more confluent acute infarct in the left basal ganglia and corona radiata when compared to diffusion imaging. Pt is now on regular diet with thin liquids with precautions of small bites/sips, multiple swallows, and minimize environmental distractions.       Prior Functional Status   Cognitive/Linguistic Baseline  Within functional limits      Cognition   Overall Cognitive Status  Impaired/Different from baseline   May need to assess fully at later date   Area of Impairment  Awareness;Attention    Attention Comments  pt demonstrated decr'd cohesive thought in 4 minute pt-heavy  conversation/monologue about body building.    Awareness Comments  Pt remarked about excess saliva in his mouth as his goal for ST, declining to mention aphasia, cognition, or dysarthria.     Behaviors  --   flat affect, lethargic     Auditory Comprehension   Overall Auditory Comprehension  Appears within functional limits for tasks assessed      Verbal Expression   Overall Verbal Expression  Impaired    Interfering Components  Attention;Speech intelligibility    Other Verbal Expression Comments  In simple to mod complex conversaiton pt demonstrated hesitiation and pausing in his speech indicative of word finding deficits. Pt daughter remarked that pt will stop verbal expression at times and search for a word. Some of this might be related to attention, however SLP believes there is an aspect of decr'd verbal expression due to anomia present during conversation with pt.      Motor Speech   Overall Motor Speech  Impaired    Respiration  Impaired   necessary to cue full breath, consistently with "hey"   Level of Impairment  Sentence    Phonation  Low vocal intensity   average 66dB in 4 minutes verbal expression   Intelligibility  Intelligibility reduced    Conversation  --   90-95% in quiet environment   Effective Techniques  Increased vocal intensity   in mid-upper 60sdB mod cues;HEY-low 80sdB;loud/a/-low 70sdB                     SLP Education - 10/04/18 0929    Education Details  deficit areas, results of eval, talking louder will assist listener to understand him, WNL loudness between 70-72dB, compensatory technique (talking louder), loud "hey!"    Person(s) Educated  Patient;Child(ren)    Methods  Explanation;Demonstration;Verbal cues    Comprehension  Verbalized understanding;Need further instruction;Returned demonstration;Verbal cues required       SLP Short Term Goals - 10/04/18 1059      SLP SHORT TERM GOAL #1   Title  Pt will undergo naming assessment  and/or cognitive linguistic assessment PRN    Time  3    Period  Weeks    Status  New      SLP SHORT TERM GOAL #2   Title  pt will demo improved conversational volume in sentence responses- average mid-upper 60s dB, over two sessions    Time  5    Period  Weeks    Status  New      SLP SHORT TERM GOAL #3  Title  pt will demo improved conversational volume in 5 mintues simple conversation with average mid-upper 60s dB, over three sessions    Time  5    Period  Weeks    Status  New      SLP SHORT TERM GOAL #4   Title  pt will demo abdominal breathing 60% of the time in 5 minutes conversation over three sessions    Time  5    Period  Weeks    Status  New      SLP SHORT TERM GOAL #5   Title  pt will name 3 non-physical deficits over three sessions    Time  5    Period  Weeks      Additional Short Term Goals   Additional Short Term Goals  Yes      SLP SHORT TERM GOAL #6   Title  pt/family will provide 3 overt s/s aspiration PNA with modified independence over 2 sessions    Time  3    Period  Weeks    Status  New       SLP Long Term Goals - 10/04/18 1103      SLP LONG TERM GOAL #1   Title  pt will demo improved conversational volume in 8 minutes simple conversation average upper 60s-low 70s dB, over three sessions    Time  10    Period  Weeks   or 21 total visits, for all LTGs   Status  New      SLP LONG TERM GOAL #2   Title  pt will demo abdominal breathing 70% of the time in 5 minutes conversation over three sessions    Time  10    Period  Weeks    Status  New      SLP LONG TERM GOAL #3   Title  pt will complete a functional, simple organizational task for 25 minutes demonstrating adequate selelctive attention with modified indpendence, over three sessions    Time  10    Period  Weeks    Status  New      SLP LONG TERM GOAL #4   Title  pt will double check answers with speech therapy tasks (anticipatory awareness) with nonverbal cues over three sessions     Time  10    Status  New      SLP LONG TERM GOAL #5   Title  SLP will monitor pt to foster cont'd pulmonary health    Time  10    Period  Weeks    Status  New       Plan - 10/04/18 0954    Clinical Impression Statement  Pt presents today with dysarthria due to recent CVA, characterized by reduced breath support and insufficient voice volume which decreases pt's speech intelligibilty today to 90-95% in this quiet environment. Pt also presents with cognitive communication deficits in (at least) attention and awareness. Cognitive communciation testing may be appropriate in the near future, pending consultation with OT. Additionally, pt dtr (as well as ST notes from CIR) endorses word finding as a deficit area. Further testing to follow. Pt would benefit from skilled ST addressing improved speech intelligibilty, an increase in cognitive communication skills, and more fluid conversation due to improved word finding. Pt would also benefit from skilled ST to learn and use compensations for his deficits, routinely.     Speech Therapy Frequency  2x / week    Duration  --   10 weeks,  or 21 total visits   Treatment/Interventions  Oral motor exercises;Cueing hierarchy;Environmental controls;Compensatory techniques;Cognitive reorganization;Functional tasks;SLP instruction and feedback;Patient/family education;Internal/external aids;Aspiration precaution training;Pharyngeal strengthening exercises;Diet toleration management by SLP;Trials of upgraded texture/liquids    Potential to Achieve Goals  Good    Potential Considerations  Ability to learn/carryover information;Severity of impairments    SLP Home Exercise Plan  loud HEY!    Consulted and Agree with Plan of Care  Patient;Family member/caregiver    Family Member Consulted  daughter       Patient will benefit from skilled therapeutic intervention in order to improve the following deficits and impairments:   Dysarthria and anarthria  Cognitive  communication deficit  Aphasia  Dysphagia, oropharyngeal phase    Problem List Patient Active Problem List   Diagnosis Date Noted  . Slow transit constipation   . Benign prostatic hyperplasia with urinary retention   . Right hemiparesis ([place])   . Chronic right shoulder pain   . Primary osteoarthritis of right shoulder   . Left basal ganglia embolic stroke ([place]) 79/39/0300  . CVA (cerebral vascular accident) ([place]) 08/31/2018  . Elevated aspartate aminotransferase level 06/16/2015  . H/O nutritional disorder 06/16/2015  . Excessive salivation 06/16/2015  . 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 Heart palpitations 06/16/2015  . 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 Screening for prostate cancer 08/21/2014  . Atypical chest pain 08/10/2013  . 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 [place], [place], [address] Phone: [phone]   Fax:  [phone]  Name: Edward Glass MRN: [id number] Date of Birth: 1956-06-16

## 2018-10-04 NOTE — Therapy (Deleted)
[facility] [address] [place], [place], [address] Phone: [phone]   Fax:  [phone]  Speech Language Pathology Treatment  Patient Details  Name: Edward Glass MRN: [id number] Date of Birth: 01-03-1956 Referring Provider (SLP): Alysia Penna, MD   Encounter Date: 10/03/2018  End of Session - 10/04/18 0930    Visit Number  1    Number of Visits  21    Date for SLP Re-Evaluation  12/27/18    Authorization Type  30 visits ST (20 planned, save 10?)    Authorization - Visit Number  1    Authorization - Number of Visits  30    SLP Start Time  0932    SLP Stop Time   1616    SLP Time Calculation (min)  43 min    Activity Tolerance  Patient tolerated treatment well   lethargic for most of session      Past Medical History:  Diagnosis Date  . Atypical chest pain 08/10/2013  . Elevated aspartate aminotransferase level 06/16/2015  . Excessive salivation 06/16/2015  . H/O nutritional disorder 06/16/2015  . 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 Screening for prostate cancer 08/21/2014  . 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 HERNIA REPAIR  2006  . left tricep surgery    . LOOP RECORDER INSERTION N/A 09/04/2018   Procedure: LOOP RECORDER INSERTION;  Surgeon: Evans Lance, MD;  Location: [facility];  Service: Cardiovascular;  Laterality: N/A;  . TEE WITHOUT CARDIOVERSION N/A 09/04/2018   Procedure: TRANSESOPHAGEAL ECHOCARDIOGRAM (TEE);  Surgeon: Jerline Pain, MD;  Location[facility] ENDOSCOPY;  Service: Cardiovascular;  Laterality: N/A;    There were no vitals filed for this visit.  Subjective Assessment - 10/03/18 1541    Subjective  "She had me talking about general stuff to see what I was aware of."    Patient is accompained by:  Family member   dtr   Currently in Pain?  Yes    Pain Score  4     Pain Location  Arm    Pain  Orientation  Right    Pain Descriptors / Indicators  Throbbing    Pain Type  Acute pain    Pain Onset  1 to 4 weeks ago    Pain Frequency  Constant        SLP Evaluation [facility] - 10/04/18 0001      SLP Visit Information   SLP Received On  10/03/18    Referring Provider (SLP)  Alysia Penna, MD    Onset Date  08-30-18    Medical Diagnosis  CVA      Subjective   Patient/Family Stated Goal  "Get the condensation out of my mouth." Later upon questioning more directly, pt desires to speak at a louder level, when given an option of 3 things on which to focus first (cognition, aphasia, or speech loudness).      Pain Assessment   Currently in Pain?  Yes    Pain Score  4     Pain Location  Arm    Pain Orientation  Right    Pain Type  Acute pain    Pain Onset  1 to 4 weeks ago    Pain Frequency  Constant      General Information   HPI  Pt is a 63 y.o. male with medical history significant of hypogonadism, possible BPH,  who presents with confusion, difficulty speaking, right facial droop. 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 Pt demonstrated increased weakness on 08/31/18. A repeat CT of the head was conducted and it revealed a more confluent acute infarct in the left basal ganglia and corona radiata when compared to diffusion imaging. Pt is now on regular diet with thin liquids with precautions of small bites/sips, multiple swallows, and minimize environmental distractions.       Prior Functional Status   Cognitive/Linguistic Baseline  Within functional limits      Cognition   Overall Cognitive Status  Impaired/Different from baseline   May need to assess fully at later date   Area of Impairment  Awareness;Attention    Attention Comments  pt demonstrated decr'd cohesive thought in 4 minute pt-heavy  conversation/monologue about body building.    Awareness Comments  Pt remarked about excess saliva in his mouth as his goal for ST, declining to mention aphasia, cognition, or dysarthria.     Behaviors  --   flat affect, lethargic     Auditory Comprehension   Overall Auditory Comprehension  Appears within functional limits for tasks assessed      Verbal Expression   Overall Verbal Expression  Impaired    Interfering Components  Attention;Speech intelligibility    Other Verbal Expression Comments  In simple to mod complex conversaiton pt demonstrated hesitiation and pausing in his speech indicative of word finding deficits. Pt daughter remarked that pt will stop verbal expression at times and search for a word. Some of this might be related to attention, however SLP believes there is an aspect of decr'd verbal expression due to anomia present during conversation with pt.      Motor Speech   Overall Motor Speech  Impaired    Respiration  Impaired   necessary to cue full breath, consistently with "hey"   Level of Impairment  Sentence    Phonation  Low vocal intensity   average 66dB in 4 minutes verbal expression   Intelligibility  Intelligibility reduced    Conversation  --   90-95% in quiet environment   Effective Techniques  Increased vocal intensity   in mid-upper 60sdB mod cues;HEY-low 80sdB;loud/a/-low 70sdB          SLP Education - 10/04/18 0929    Education Details  deficit areas, results of eval, talking louder will assist listener to understand him, WNL loudness between 70-72dB, compensatory technique (talking louder), loud "hey!"    Person(s) Educated  Patient;Child(ren)    Methods  Explanation;Demonstration;Verbal cues    Comprehension  Verbalized understanding;Need further instruction;Returned demonstration;Verbal cues required       SLP Short Term Goals - 10/04/18 1059      SLP SHORT TERM GOAL #1   Title  Pt will undergo naming assessment and/or cognitive  linguistic assessment PRN    Time  3    Period  Weeks    Status  New      SLP SHORT TERM GOAL #2   Title  pt will demo improved conversational volume in sentence responses- average mid-upper 60s dB, over two sessions    Time  5    Period  Weeks    Status  New      SLP SHORT TERM GOAL #3   Title  pt will demo improved conversational volume in 5  mintues simple conversation with average mid-upper 60s dB, over three sessions    Time  5    Period  Weeks    Status  New      SLP SHORT TERM GOAL #4   Title  pt will demo abdominal breathing 60% of the time in 5 minutes conversation over three sessions    Time  5    Period  Weeks    Status  New      SLP SHORT TERM GOAL #5   Title  pt will name 3 non-physical deficits over three sessions    Time  5    Period  Weeks      Additional Short Term Goals   Additional Short Term Goals  Yes      SLP SHORT TERM GOAL #6   Title  pt/family will provide 3 overt s/s aspiration PNA with modified independence over 2 sessions    Time  3    Period  Weeks    Status  New       SLP Long Term Goals - 10/04/18 1103      SLP LONG TERM GOAL #1   Title  pt will demo improved conversational volume in 8 minutes simple conversation average upper 60s-low 70s dB, over three sessions    Time  10    Period  Weeks   or 21 total visits, for all LTGs   Status  New      SLP LONG TERM GOAL #2   Title  pt will demo abdominal breathing 70% of the time in 5 minutes conversation over three sessions    Time  10    Period  Weeks    Status  New      SLP LONG TERM GOAL #3   Title  pt will complete a functional, simple organizational task for 25 minutes demonstrating adequate selelctive attention with modified indpendence, over three sessions    Time  10    Period  Weeks    Status  New      SLP LONG TERM GOAL #4   Title  pt will double check answers with speech therapy tasks (anticipatory awareness) with nonverbal cues over three sessions    Time  10    Status   New      SLP LONG TERM GOAL #5   Title  SLP will monitor pt to foster cont'd pulmonary health    Time  10    Period  Weeks    Status  New       Plan - 10/04/18 0954    Clinical Impression Statement  Pt presents today with dysarthria due to recent CVA, characterized by reduced breath support and insufficient voice volume which decreases pt's speech intelligibilty today to 90-95% in this quiet environment. Pt also presents with cognitive communication deficits in (at least) attention and awareness. Cognitive communciation testing may be appropriate in the near future, pending consultation with OT. Additionally, pt dtr (as well as ST notes from CIR) endorses word finding as a deficit area. Further testing to follow. Pt would benefit from skilled ST addressing improved speech intelligibilty, an increase in cognitive communication skills, and more fluid conversation due to improved word finding. Pt would also benefit from skilled ST to learn and use compensations for his deficits, routinely.     Speech Therapy Frequency  2x / week    Duration  --   10 weeks, or 21 total visits   Treatment/Interventions  Oral motor  exercises;Cueing hierarchy;Environmental controls;Compensatory techniques;Cognitive reorganization;Functional tasks;SLP instruction and feedback;Patient/family education;Internal/external aids;Aspiration precaution training;Pharyngeal strengthening exercises;Diet toleration management by SLP;Trials of upgraded texture/liquids    Potential to Achieve Goals  Good    Potential Considerations  Ability to learn/carryover information;Severity of impairments    SLP Home Exercise Plan  loud HEY!    Consulted and Agree with Plan of Care  Patient;Family member/caregiver    Family Member Consulted  daughter       Patient will benefit from skilled therapeutic intervention in order to improve the following deficits and impairments:   Dysarthria and anarthria  Cognitive communication  deficit  Aphasia  Dysphagia, oropharyngeal phase    Problem List Patient Active Problem List   Diagnosis Date Noted  . Slow transit constipation   . Benign prostatic hyperplasia with urinary retention   . Right hemiparesis ([place])   . Chronic right shoulder pain   . Primary osteoarthritis of right shoulder   . Left basal ganglia embolic stroke ([place]) 89/21/1941  . CVA (cerebral vascular accident) ([place]) 08/31/2018  . Elevated aspartate aminotransferase level 06/16/2015  . H/O nutritional disorder 06/16/2015  . Excessive salivation 06/16/2015  . 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 Heart palpitations 06/16/2015  . 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 Screening for prostate cancer 08/21/2014  . Atypical chest pain 08/10/2013  . 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 [place], [place], [address] Phone: [phone]   Fax:  [phone]   Name: Edward Glass MRN: [id number] Date of Birth: 1955-09-18

## 2018-10-05 ENCOUNTER — Other Ambulatory Visit: Payer: Self-pay

## 2018-10-05 ENCOUNTER — Ambulatory Visit: Payer: BLUE CROSS/BLUE SHIELD | Admitting: Occupational Therapy

## 2018-10-05 ENCOUNTER — Encounter: Payer: Self-pay | Admitting: Occupational Therapy

## 2018-10-05 DIAGNOSIS — M25531 Pain in right wrist: Secondary | ICD-10-CM

## 2018-10-05 DIAGNOSIS — M25511 Pain in right shoulder: Secondary | ICD-10-CM

## 2018-10-05 DIAGNOSIS — R2689 Other abnormalities of gait and mobility: Secondary | ICD-10-CM | POA: Diagnosis not present

## 2018-10-05 DIAGNOSIS — R2681 Unsteadiness on feet: Secondary | ICD-10-CM

## 2018-10-05 DIAGNOSIS — M79641 Pain in right hand: Secondary | ICD-10-CM

## 2018-10-05 DIAGNOSIS — R293 Abnormal posture: Secondary | ICD-10-CM

## 2018-10-05 DIAGNOSIS — R600 Localized edema: Secondary | ICD-10-CM

## 2018-10-05 DIAGNOSIS — M6281 Muscle weakness (generalized): Secondary | ICD-10-CM

## 2018-10-05 DIAGNOSIS — I69351 Hemiplegia and hemiparesis following cerebral infarction affecting right dominant side: Secondary | ICD-10-CM

## 2018-10-05 DIAGNOSIS — I69318 Other symptoms and signs involving cognitive functions following cerebral infarction: Secondary | ICD-10-CM

## 2018-10-05 NOTE — Therapy (Signed)
[facility] [address] [place], [place], [address] Phone: [phone]   Fax:  [phone]  Occupational Therapy Evaluation  Patient Details  Name: Edward Glass MRN: [id number] Date of Birth: Oct 22, 1955 Referring Provider (OT): Dr. Alysia Penna   Encounter Date: 10/05/2018  OT End of Session - 10/05/18 1437    Visit Number  1    Number of Visits  24    Date for OT Re-Evaluation  12/28/18    Authorization Type  [employer] - has 30 visits between OT and PT however if visits occur on same day counts as 1 visit. Discussion with pt re: use of remaining visits at vist 20    OT Start Time  1320    OT Stop Time  1400    OT Time Calculation (min)  40 min    Activity Tolerance  Patient tolerated treatment well       Past Medical History:  Diagnosis Date  . Atypical chest pain 08/10/2013  . Elevated aspartate aminotransferase level 06/16/2015  . Excessive salivation 06/16/2015  . H/O nutritional disorder 06/16/2015  . 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 Screening for prostate cancer 08/21/2014  . 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 HERNIA REPAIR  2006  . left tricep surgery    . LOOP RECORDER INSERTION N/A 09/04/2018   Procedure: LOOP RECORDER INSERTION;  Surgeon: Evans Lance, MD;  Location: [facility];  Service: Cardiovascular;  Laterality: N/A;  . TEE WITHOUT CARDIOVERSION N/A 09/04/2018   Procedure: TRANSESOPHAGEAL ECHOCARDIOGRAM (TEE);  Surgeon: Jerline Pain, MD;  Location: [facility] ENDOSCOPY;  Service: Cardiovascular;  Laterality: N/A;    There were no vitals filed for this visit.  Subjective Assessment - 10/05/18 1324    Patient is accompanied by:  Family member   dtr [name]   Pertinent History  L CVA on 09/14/18  Loop recorder, pt taking blood thinner    Currently in Pain?  Yes    Pain Score  8     Pain Location  Hand   and  wrist   Pain Orientation  Right    Pain Descriptors / Indicators  Aching    Pain Type  Acute pain    Pain Onset  More than a month ago    Pain Frequency  Constant    Aggravating Factors   any movement or touching of right hand/wrist    Pain Relieving Factors  avoiding touching or moving it        [employer] OT Assessment - 10/05/18 0001      Assessment   Medical Diagnosis  L CVA    Referring Provider (OT)  Dr. Alysia Penna    Onset Date/Surgical Date  09/14/18    Hand Dominance  Right    Prior Therapy  Pt was in inpt rehab for PT, OT and ST      Precautions   Precautions  Fall    Precaution Comments  Pt with very swollen and painful R hand     Other Brace/Splint  R AFO      Restrictions   Weight Bearing Restrictions  No      Balance Screen   Has the patient fallen in the past 6 months  No   Pt currently seeing PT     [place] expects to be discharged to:  Private residence    Living  Arrangements  Children   dtr and son in law   Available Help at Discharge  Available 24 hours/day    Type of [place]  One level    Bathroom Shower/Tub  Tub/Shower unit    [employer]  --   dtr reports toilet is low   Additional Comments  Pt has transfer tub bench, grab bars in shower, cane      Prior Function   Level of Independence  Independent    Vocation  Full time employment    Leisure  shoot pool      ADL   Eating/Feeding  Minimal assistance   for cutting   Grooming  Modified independent    Upper Body Bathing  Supervision/safety    Lower Body Bathing  Supervision/safety    Upper Body Dressing  --   mod I   Lower Body Dressing  Minimal assistance   for R shoe and brace; pt has shoe buttons   Toilet Transfer  Supervision/safety    Toileting - Clothing Manipulation  Modified independent    [place] Transfer  Modified independent    ADL comments  dtr reports pt wants to do by  himself however she feels she still needs to be there in case he loses his balance.       IADL   Shopping  Completely unable to shop    Light Housekeeping  Does not participate in any housekeeping tasks   pt lived alone prior to this so was Independent   Meal Prep  Needs to have meals prepared and served    [employer] on family or friends for transportation    Medication Management  Is not capable of dispensing or managing own medication      Mobility   Mobility Status  Needs assist      Written Expression   Dominant Hand  Right      Vision - History   Baseline Vision  Wears glasses only for reading    Additional Comments  Blurry vision initially however pt reports that vision is now back to baseline - will continue to monitor      Activity Tolerance   Activity Tolerance  Tolerates 30 min activity with multiple rests      Cognition   Overall Cognitive Status  Impaired/Different from baseline    Mini Mental State Exam   see ST eval - will continue to monitor related to functional tasks.      Posture/Postural Control   Posture/Postural Control  Postural limitations    Postural Limitations  Weight shift left;Rounded Shoulders;Forward head    Posture Comments  scapula depressed, abducted and downwardly rotated      Sensation   Light Touch  Impaired by gross assessment   dorsal palm duller   Hot/Cold  Appears Intact    Proprioception  Appears Intact      Coordination   Gross Motor Movements are Fluid and Coordinated  No    Fine Motor Movements are Fluid and Coordinated  No    Other  Pt unable to complete any standardized test due to signficant R hemiplegia/severe pain in R hand      Praxis   Praxis  --   to be assessed functionally     Edema   Edema  signficant edema in R hand and wrist      Tone   Assessment Location  Right Upper Extremity      ROM / Strength   AROM / PROM / Strength  AROM;PROM;Strength      AROM   Overall AROM   Deficits     Overall AROM Comments  AAROM for shoulder flexion 40*, abduction 25*, full ER, no ER, elbow flexion/extension WFL's (active assisted), pronation WFL's, no supination,can intiate wrist flexion/extension (active assist) as well as 3/4 range for finger flexion and partial finger extension.        PROM   Overall PROM   Deficits    Overall PROM Comments  90* shoulder flexion, 45* abduction (pt with 5/10 pain at end of those ranges), full ER, full elbow flexion/extension, 1/4 range for supination due to wrist pain and tightness, difficult to assess wrist flexion/extension due to pain, pt does have full flexion/extension of fingers.       Strength   Overall Strength  Deficits;Unable to assess    Overall Strength Comments  unable to assess due to altered tone, pain      Hand Function   Right Hand Gross Grasp  Impaired    Left Hand Gross Grasp  Functional    Comment  unable to assess R grip strength due to decreased full AROM and pain in hand/wrist      RUE Tone   RUE Tone  Hypotonic      RUE Tone   Hypotonic Details  significant hypotonic tone throughout RUE.                       OT Education - 10/05/18 1416    Education Details  initial OT findings, positiong of R hand for edema, use of current splint to support wrist until one can be made for patient, AAROM for finger flexion/extension as well as asssisitng pt in using RUE to drink from water bottle, hold items in R hand    Person(s) Educated  Patient;Child(ren)    Methods  Explanation;Demonstration;Verbal cues    Comprehension  Verbalized understanding       OT Short Term Goals - 10/05/18 1418      OT SHORT TERM GOAL #1   Title  Pt and family will be mod I  with RUE positioning/care to address edema and pain in RUE - 11/16/2018    Status  New      OT SHORT TERM GOAL #2   Title  Pt and family will be mod I with splint wear and care    Status  New      OT SHORT TERM GOAL #3   Title  Pt and dtr will be mod I with HEP  for RUE for ROM, addressing pain, functional use of RUE prn    Status  New      OT SHORT TERM GOAL #4   Title  Pt will report pain in R hand/wrist no greater than 5/10 during HEP.      Status  New      OT SHORT TERM GOAL #5   Title  Pt will demonstrate the ability for 55* of shoulder flexion as prep for functional low reach    Status  New      Additional Short Term Goals   Additional Short Term Goals  Yes      OT SHORT TERM GOAL #6   Title  Pt will be mod I with toilet and tub bench transfers    Status  New      OT SHORT TERM  GOAL #7   Title  Pt will be mod I with bathing at shower level    Status  New        OT Long Term Goals - 10/05/18 1424      OT LONG TERM GOAL #1   Title  Pt and family will be mod I with upgraded home activities program - 12/28/2018    Status  New      OT LONG TERM GOAL #2   Title  Pt will demonstrate ability to grasp and hold light weight object in R hand with pain 2/10 or less    Status  New      OT LONG TERM GOAL #3   Title  Pt will demonstrate ability for bilateral low reach to obtain light weigh object     Status  New      OT LONG TERM GOAL #4   Title  Pt will be mod I with dressing    Status  New      OT LONG TERM GOAL #5   Title  Pt will be mod I with cutting food, AE prn    Status  New      OT LONG TERM GOAL #6   Title  Pt will demonstrate ability to use RUE as stabilizer at least 75% of the time in simple ADL tasks    Status  New      OT LONG TERM GOAL #7   Title  Pt will be mod I with simple snack/be prep at ambulatory level    Status  New      OT LONG TERM GOAL #8   Title  Pt will demonstrate improved postural alignment to decrease pain in RUE as well as decrease risk of falls    Status  New            Plan - 10/05/18 1428    Clinical Impression Statement  Pt is a 63 year old male s/p L CVA on 08/27/18. Pt participated in inpt rehab and was discharged home on 09/29/2018.  PMH: HTN, hypogonadism, BPH, OA, chronic R shoulder  pain, atpical chest pain.  Pt presents today with following impairments that impact independence in basic self care, IADLs, and functional use of RUE:  R dominant hemiplegia, decreased A/PROM, decreased muscle strength, pain in R shoulder/wrist/hand, impaired sensation, signficant edema R hand, decreased balance, decreased functional mobility. Pt will benefit from skilled OT address these deficits and maximize independence.      Occupational performance deficits (Please refer to evaluation for details):  ADL's;IADL's;Rest and Sleep;Work;Leisure;Social Participation    Body Structure / Function / Physical Skills  ADL;Balance;Decreased knowledge of use of DME;Coordination;Edema;GMC;FMC;Flexibility;IADL;Mobility;Pain;ROM;Strength;Tone;UE functional use;Endurance    Cognitive Skills  --   insight, judgement   Clinical Decision Making  Multiple treatment options, significant modification of task necessary    Comorbidities Affecting Occupational Performance:  Presence of comorbidities impacting occupational performance    Comorbidities impacting occupational performance description:  HTN, atypical chest pain, chronic R shoulder pain, OA of R shoulder, aphasia    Modification or Assistance to Complete Evaluation   Max significant modification of tasks or assist is necessary to complete    OT Frequency  2x / week    OT Duration  12 weeks    OT Treatment/Interventions  Self-care/ADL training;Aquatic Therapy;Cryotherapy;Therapeutic exercise;Neuromuscular education;Energy conservation;DME and/or AE instruction;Functional Mobility Training;Cognitive remediation/compensation;Balance training;Splinting;Manual Therapy;Passive range of motion;Therapeutic activities;Patient/family education    Plan  Review OT goals and POC, address edema in  R hand, initiate HEP as possible, NMR for RUE/trunk/balance    Consulted and Agree with Plan of Care  Patient;Family member/caregiver    Family Member Consulted  dtr [name]        Patient will benefit from skilled therapeutic intervention in order to improve the following deficits and impairments:  Body Structure / Function / Physical Skills, Cognitive Skills  Visit Diagnosis: Hemiplegia and hemiparesis following cerebral infarction affecting right dominant side ([place]) - Plan: Ot plan of care cert/re-cert  Muscle weakness (generalized) - Plan: Ot plan of care cert/re-cert  Abnormal posture - Plan: Ot plan of care cert/re-cert  Acute pain of right shoulder - Plan: Ot plan of care cert/re-cert  Pain in right hand - Plan: Ot plan of care cert/re-cert  Pain in right wrist - Plan: Ot plan of care cert/re-cert  Unsteadiness on feet - Plan: Ot plan of care cert/re-cert  Localized edema - Plan: Ot plan of care cert/re-cert  Other symptoms and signs involving cognitive functions following cerebral infarction - Plan: Ot plan of care cert/re-cert    Problem List Patient Active Problem List   Diagnosis Date Noted  . Slow transit constipation   . Benign prostatic hyperplasia with urinary retention   . Right hemiparesis ([place])   . Chronic right shoulder pain   . Primary osteoarthritis of right shoulder   . Left basal ganglia embolic stroke ([place]) 34/28/7681  . CVA (cerebral vascular accident) ([place]) 08/31/2018  . Elevated aspartate aminotransferase level 06/16/2015  . H/O nutritional disorder 06/16/2015  . Excessive salivation 06/16/2015  . 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 Heart palpitations 06/16/2015  . 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 Screening for prostate cancer 08/21/2014  . Atypical chest pain 08/10/2013  . 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 [place], [place], [address] Phone: [phone]   Fax:  [phone]  Name: Edward Glass MRN: [id number] Date of Birth: 1956-05-17

## 2018-10-09 ENCOUNTER — Other Ambulatory Visit: Payer: Self-pay

## 2018-10-09 ENCOUNTER — Encounter: Payer: Self-pay | Admitting: Physical Therapy

## 2018-10-09 ENCOUNTER — Encounter: Payer: Self-pay | Admitting: Occupational Therapy

## 2018-10-09 ENCOUNTER — Ambulatory Visit (INDEPENDENT_AMBULATORY_CARE_PROVIDER_SITE_OTHER): Payer: BLUE CROSS/BLUE SHIELD | Admitting: *Deleted

## 2018-10-09 ENCOUNTER — Ambulatory Visit: Payer: BLUE CROSS/BLUE SHIELD | Admitting: Occupational Therapy

## 2018-10-09 ENCOUNTER — Ambulatory Visit: Payer: BLUE CROSS/BLUE SHIELD | Admitting: Physical Therapy

## 2018-10-09 DIAGNOSIS — R29818 Other symptoms and signs involving the nervous system: Secondary | ICD-10-CM

## 2018-10-09 DIAGNOSIS — M79641 Pain in right hand: Secondary | ICD-10-CM

## 2018-10-09 DIAGNOSIS — R293 Abnormal posture: Secondary | ICD-10-CM

## 2018-10-09 DIAGNOSIS — I63512 Cerebral infarction due to unspecified occlusion or stenosis of left middle cerebral artery: Secondary | ICD-10-CM | POA: Diagnosis not present

## 2018-10-09 DIAGNOSIS — R2689 Other abnormalities of gait and mobility: Secondary | ICD-10-CM | POA: Diagnosis not present

## 2018-10-09 DIAGNOSIS — I69318 Other symptoms and signs involving cognitive functions following cerebral infarction: Secondary | ICD-10-CM

## 2018-10-09 DIAGNOSIS — I69351 Hemiplegia and hemiparesis following cerebral infarction affecting right dominant side: Secondary | ICD-10-CM

## 2018-10-09 DIAGNOSIS — M6281 Muscle weakness (generalized): Secondary | ICD-10-CM

## 2018-10-09 DIAGNOSIS — M25531 Pain in right wrist: Secondary | ICD-10-CM

## 2018-10-09 DIAGNOSIS — R600 Localized edema: Secondary | ICD-10-CM

## 2018-10-09 DIAGNOSIS — R2681 Unsteadiness on feet: Secondary | ICD-10-CM

## 2018-10-09 DIAGNOSIS — M25511 Pain in right shoulder: Secondary | ICD-10-CM

## 2018-10-09 NOTE — Patient Instructions (Signed)
Access Code: [email or web address]  URL: [email or web address]  Date: 10/09/2018  Prepared by: Bjorn Loser   Exercises  Sit to Stand without Arm Support - 10 reps - 1-2 sets - 1x daily - 5x weekly  Seated Long Arc Quad - 10 reps - 1-2 sets - 1x daily - 5x weekly  Seated Knee Flexion - 10 reps - 2 sets - 1-2x daily - 5x weekly  hip internal rotation - 10 reps - 2 sets - 1-2x daily - 5x weekly  Supine Bridge - 10 reps - 2 sets - 5 seconds hold - 1x daily - 5x weekly

## 2018-10-09 NOTE — Therapy (Signed)
[facility] [address] [place], [place], [address] Phone: [phone]   Fax:  [phone]  Physical Therapy Treatment  Patient Details  Name: Edward Glass MRN: [id number] Date of Birth: September 14, 1955 Referring Provider (PT): Alysia Penna MD   Encounter Date: 10/09/2018  PT End of Session - 10/09/18 1302    Visit Number  2    Number of Visits  17    Date for PT Re-Evaluation  12/02/18    Authorization Type  BCBS; met $7500 deductible; VL: PT/OT 30, zero used; seen on same day =1 visit; SLP 30 visits    PT Start Time  1105    PT Stop Time  1148    PT Time Calculation (min)  43 min    Activity Tolerance  Patient tolerated treatment well   lethargic for most of session      Past Medical History:  Diagnosis Date  . Atypical chest pain 08/10/2013  . Elevated aspartate aminotransferase level 06/16/2015  . Excessive salivation 06/16/2015  . H/O nutritional disorder 06/16/2015  . 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 Screening for prostate cancer 08/21/2014  . 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 HERNIA REPAIR  2006  . left tricep surgery    . LOOP RECORDER INSERTION N/A 09/04/2018   Procedure: LOOP RECORDER INSERTION;  Surgeon: Evans Lance, MD;  Location: [facility];  Service: Cardiovascular;  Laterality: N/A;  . TEE WITHOUT CARDIOVERSION N/A 09/04/2018   Procedure: TRANSESOPHAGEAL ECHOCARDIOGRAM (TEE);  Surgeon: Jerline Pain, MD;  Location: [facility] ENDOSCOPY;  Service: Cardiovascular;  Laterality: N/A;    There were no vitals filed for this visit.  Subjective Assessment - 10/09/18 1108    Subjective  Family not present with pt today in therapy.  Pt reports that Dtr walks with pt at home.    Pertinent History  chronic rt shoulder pain; had recent fall with rt wrist pain, xray negative for fracture and had steroid injection  09/27/18    Limitations  Walking    How long can you walk comfortably?  150 ft    Diagnostic tests  MRI-Multiple small foci of acute/early subacute infarctions present in the left MCA distribution concentrated in left basal ganglia; Small chronic infarcts in right parietal and occipital lobes    Patient Stated Goals  improve his walking and balance, be able to live on his own again in his 2 story house    Currently in Pain?  Yes    Pain Score  6     Pain Location  Hand    Pain Orientation  Right    Pain Descriptors / Indicators  Tightness    Pain Type  Acute pain    Pain Onset  More than a month ago    Pain Frequency  Intermittent    Aggravating Factors   movement         Pt performed x10 with each exercise; cues given for technique. Exercises  Sit to Stand without Arm Support - 10 reps - 1-2 sets - 1x daily - 5x weekly  Seated Long Arc Quad - 10 reps - 1-2 sets - 1x daily - 5x weekly  Seated Knee Flexion - 10 reps - 2 sets - 1-2x daily - 5x weekly  hip internal rotation - 10 reps - 2 sets - 1-2x daily - 5x weekly  Supine Bridge - 10 reps - 2  sets - 5 seconds hold - 1x daily - 5x weekly                  [facility] Adult PT Treatment/Exercise - 10/09/18 0001      Transfers   Transfers  Sit to Stand;Stand to Sit;Stand Pivot Transfers    Sit to Stand  5: Supervision    Sit to Stand Details  Visual cues/gestures for precautions/safety;Visual cues/gestures for sequencing    Sit to Stand Details (indicate cue type and reason)  cues for hand placement    Stand to Sit  5: Supervision    Stand to Sit Details (indicate cue type and reason)  Visual cues/gestures for precautions/safety    Stand to Sit Details  cues for hand placement    Stand Pivot Transfers  4: Min guard;Other (comment)   with [facility]      Ambulation/Gait   Ambulation/Gait  Yes    Ambulation/Gait Assistance  4: Min guard   cues to increase Let step length   Ambulation Distance (Feet)  40 Feet    Assistive device   Small based quad cane;Other (Comment)   R AFO donned   Gait Pattern  Step-through pattern;Decreased arm swing - right;Decreased step length - left;Decreased stance time - right;Decreased hip/knee flexion - right;Decreased dorsiflexion - right;Decreased weight shift to right;Right flexed knee in stance;Decreased trunk rotation;Poor foot clearance - left;Poor foot clearance - right    Ambulation Surface  Level;Indoor          Balance Exercises - 10/09/18 1125      Balance Exercises: Standing   Sit to Stand Time  5x2 from mat supervision, from lower chair surface min a without UE support.        PT Education - 10/09/18 1301    Education Details  HEP for R LE strengthening.    Person(s) Educated  Patient    Methods  Explanation;Demonstration;Verbal cues;Tactile cues;Handout    Comprehension  Verbalized understanding;Returned demonstration;Verbal cues required;Tactile cues required;Need further instruction       PT Short Term Goals - 10/04/18 8590      PT SHORT TERM GOAL #1   Title  Patient will be independent with HEP (Target all STGs 4 weeks 11/02/2018)      PT SHORT TERM GOAL #2   Title  Patient will improve gait velocity to 0.75 ft/sec demonstrating progress towards decr fall risk    Baseline  3/10 0.34 ft/sec      PT SHORT TERM GOAL #3   Title  Patient will improve 5x sit to stand to <18 seconds demonstrating progress towards decr fall risk    Baseline  3/9  24.62 sec      PT SHORT TERM GOAL #4   Title  Patient will ambulate modified independent over level surfaces with LRAD x 200 ft, including avoiding objects on his right       PT SHORT TERM GOAL #5   Title  Patient will ambulate on ramp and up/down curb with close-guarding and LRAD        PT Long Term Goals - 10/04/18 0735      PT LONG TERM GOAL #1   Title  Patient will be independent with updated HEP and verbalize plan for continued community-based exercise plan (Target all LTGs 8 weeks, 12/02/2018)      PT LONG  TERM GOAL #2   Title  Patient will improve gait velocity to 1.30 ft/sec demonstrating progress towards decr fall risk  PT LONG TERM GOAL #3   Title  Patient will improve 5x sit to stand to <12 seconds demonstrating decr fall risk      PT LONG TERM GOAL #4   Title  Patient will ambulate over outdoor paved surfaces modified independent with LRAD, including up/down curb.       PT LONG TERM GOAL #5   Title  Patient will ambulate >300 ft over level indoor surface in <= 6 minutes (6MWT)      Additional Long Term Goals   Additional Long Term Goals  Yes      PT LONG TERM GOAL #6   Title  Patient will go up/down 12 steps with single rail modified independent             Plan - 10/09/18 1303    Clinical Impression Statement  Skilled session focused on gait with SBQC and R AFO, transfer training, and initiating HEP for R LE strengthening.  Gait training: working on increase weight shift onto [name] to increase L steplength; pt performed at min guard level.  Pt performed R LE strengthening with min to mod cues for technique and at supervision to min A level.                                                                                                                       Personal Factors and Comorbidities  Comorbidity 1    Comorbidities  chronic rt shoulder pain    Examination-Activity Limitations  Bathing;Bed Mobility;Caring for Others;Carry;Dressing;Locomotion Level;Lift;Reach Overhead;Self Feeding;Stairs    Examination-Participation Restrictions  Community Activity;Driving    Stability/Clinical Decision Making  Stable/Uncomplicated    Rehab Potential  Good    PT Frequency  2x / week    PT Duration  8 weeks    PT Treatment/Interventions  ADLs/Self Care Home Management;Aquatic Therapy;Electrical Stimulation;DME Instruction;Gait training;Stair training;Functional mobility training;Therapeutic activities;Therapeutic exercise;Balance training;Orthotic Fit/Training;Patient/family  education;Neuromuscular re-education;Wheelchair mobility training;Passive range of motion;Visual/perceptual remediation/compensation    PT Next Visit Plan  check HEP for LE strength, balance; pre-gait and gait training    Consulted and Agree with Plan of Care  Patient;Family member/caregiver    Family Member Consulted  daughter       Patient will benefit from skilled therapeutic intervention in order to improve the following deficits and impairments:  Abnormal gait, Decreased activity tolerance, Decreased balance, Decreased endurance, Decreased coordination, Decreased cognition, Decreased knowledge of use of DME, Decreased mobility, Decreased safety awareness, Difficulty walking, Decreased strength, Increased edema, Impaired perceived functional ability, Impaired UE functional use  Visit Diagnosis: Unsteadiness on feet  Hemiplegia and hemiparesis following cerebral infarction affecting right dominant side (HCC)  Other symptoms and signs involving the nervous system  Other abnormalities of gait and mobility     Problem List Patient Active Problem List   Diagnosis Date Noted  . Slow transit constipation   . Benign prostatic hyperplasia with urinary retention   . Right hemiparesis ([place])   . Chronic right shoulder pain   .  Primary osteoarthritis of right shoulder   . Left basal ganglia embolic stroke ([place]) 41/59/7331  . CVA (cerebral vascular accident) ([place]) 08/31/2018  . Elevated aspartate aminotransferase level 06/16/2015  . H/O nutritional disorder 06/16/2015  . Excessive salivation 06/16/2015  . 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 Heart palpitations 06/16/2015  . 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 Screening for prostate cancer 08/21/2014  . Atypical chest pain 08/10/2013  . 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 [place],  [place], [address] Phone: [phone]   Fax:  [phone]  Name: Edward Glass MRN: [id number] Date of Birth: [birth year]

## 2018-10-09 NOTE — Therapy (Signed)
[facility] [address] [place], [place], [address] Phone: [phone]   Fax:  [phone]  Occupational Therapy Treatment  Patient Details  Name: Edward Glass MRN: [id number] Date of Birth: [birth year] Referring Provider (OT): Dr. Alysia Penna   Encounter Date: 10/09/2018  OT End of Session - 10/09/18 1302    Visit Number  2    Number of Visits  24    Date for OT Re-Evaluation  12/28/18    Authorization Type  BCBS - has 30 visits between OT and PT however if visits occur on same day counts as 1 visit. Discussion with pt re: use of remaining visits at vist 20    OT Start Time  0932    OT Stop Time  1015    OT Time Calculation (min)  43 min    Activity Tolerance  Patient tolerated treatment well       Past Medical History:  Diagnosis Date  . Atypical chest pain 08/10/2013  . Elevated aspartate aminotransferase level 06/16/2015  . Excessive salivation 06/16/2015  . H/O nutritional disorder 06/16/2015  . 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 Screening for prostate cancer 08/21/2014  . 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 HERNIA REPAIR  2006  . left tricep surgery    . LOOP RECORDER INSERTION N/A 09/04/2018   Procedure: LOOP RECORDER INSERTION;  Surgeon: Evans Lance, MD;  Location: [facility];  Service: Cardiovascular;  Laterality: N/A;  . TEE WITHOUT CARDIOVERSION N/A 09/04/2018   Procedure: TRANSESOPHAGEAL ECHOCARDIOGRAM (TEE);  Surgeon: Jerline Pain, MD;  Location: [facility] ENDOSCOPY;  Service: Cardiovascular;  Laterality: N/A;    There were no vitals filed for this visit.  Subjective Assessment - 10/09/18 0939    Subjective   I kept my hand up most of the weekend (edema signficant improved).     Pertinent History  L CVA on 09/14/18  Loop recorder, pt taking blood thinner    Patient Stated Goals  walk better and use my hand     Currently in Pain?  No/denies                   OT Treatments/Exercises (OP) - 10/09/18 0001      ADLs   ADL Comments  reviewed OT goals and POC - pt in agreement.  Pt given written copy.       Splinting   Splinting  began fabrication of R modified wrist cock up to support wrist and assist with alignment and pain.  Pt today with far less edema in R hand (dtr peforming retrograde massage, also elevating and assisting pt with active assisted flexion/extension of fingers). Pt with signficantly reduced pain in R hand due to reduction in edema.  Will complete splint next session and review wearing scheduling and care of splint.                OT Short Term Goals - 10/05/18 1418      OT SHORT TERM GOAL #1   Title  Pt and family will be mod I  with RUE positioning/care to address edema and pain in RUE - 11/16/2018    Status  New      OT SHORT TERM GOAL #2   Title  Pt and family will be mod I with splint wear and care    Status  New      OT  SHORT TERM GOAL #3   Title  Pt and dtr will be mod I with HEP for RUE for ROM, addressing pain, functional use of RUE prn    Status  New      OT SHORT TERM GOAL #4   Title  Pt will report pain in R hand/wrist no greater than 5/10 during HEP.      Status  New      OT SHORT TERM GOAL #5   Title  Pt will demonstrate the ability for 55* of shoulder flexion as prep for functional low reach    Status  New      Additional Short Term Goals   Additional Short Term Goals  Yes      OT SHORT TERM GOAL #6   Title  Pt will be mod I with toilet and tub bench transfers    Status  New      OT SHORT TERM GOAL #7   Title  Pt will be mod I with bathing at shower level    Status  New        OT Long Term Goals - 10/05/18 1424      OT [place] #1   Title  Pt and family will be mod I with upgraded home activities program - 12/28/2018    Status  New      OT LONG TERM GOAL #2   Title  Pt will demonstrate ability to grasp and hold  light weight object in R hand with pain 2/10 or less    Status  New      OT LONG TERM GOAL #3   Title  Pt will demonstrate ability for bilateral low reach to obtain light weigh object     Status  New      OT LONG TERM GOAL #4   Title  Pt will be mod I with dressing    Status  New      OT LONG TERM GOAL #5   Title  Pt will be mod I with cutting food, AE prn    Status  New      OT LONG TERM GOAL #6   Title  Pt will demonstrate ability to use RUE as stabilizer at least 75% of the time in simple ADL tasks    Status  New      OT LONG TERM GOAL #7   Title  Pt will be mod I with simple snack/be prep at ambulatory level    Status  New      OT LONG TERM GOAL #8   Title  Pt will demonstrate improved postural alignment to decrease pain in RUE as well as decrease risk of falls    Status  New            Plan - 10/09/18 1301    Clinical Impression Statement  Pt is in agreement with goals and POC.  Pt progressing toward goals.     Occupational performance deficits (Please refer to evaluation for details):  ADL's;IADL's;Rest and Sleep;Work;Leisure;Social Participation    Body Structure / Function / Physical Skills  ADL;Balance;Decreased knowledge of use of DME;Coordination;Edema;GMC;FMC;Flexibility;IADL;Mobility;Pain;ROM;Strength;Tone;UE functional use;Endurance    Comorbidities Affecting Occupational Performance:  Presence of comorbidities impacting occupational performance    Comorbidities impacting occupational performance description:  HTN, atypical chest pain, chronic R shoulder pain, OA of R shoulder, aphasia    OT Frequency  2x / week    OT Duration  12 weeks    OT  Treatment/Interventions  Self-care/ADL training;Aquatic Therapy;Cryotherapy;Therapeutic exercise;Neuromuscular education;Energy conservation;DME and/or AE instruction;Functional Mobility Training;Cognitive remediation/compensation;Balance training;Splinting;Manual Therapy;Passive range of motion;Therapeutic  activities;Patient/family education    Plan  Review OT goals and POC, address edema in R hand, initiate HEP as possible, NMR for RUE/trunk/balance    Consulted and Agree with Plan of Care  Patient       Patient will benefit from skilled therapeutic intervention in order to improve the following deficits and impairments:  Body Structure / Function / Physical Skills, Cognitive Skills  Visit Diagnosis: Unsteadiness on feet  Hemiplegia and hemiparesis following cerebral infarction affecting right dominant side (HCC)  Other symptoms and signs involving the nervous system  Muscle weakness (generalized)  Abnormal posture  Acute pain of right shoulder  Pain in right hand  Pain in right wrist  Localized edema  Other symptoms and signs involving cognitive functions following cerebral infarction    Problem List Patient Active Problem List   Diagnosis Date Noted  . Slow transit constipation   . Benign prostatic hyperplasia with urinary retention   . Right hemiparesis ([place])   . Chronic right shoulder pain   . Primary osteoarthritis of right shoulder   . Left basal ganglia embolic stroke ([place]) 74/14/2395  . CVA (cerebral vascular accident) ([place]) 08/31/2018  . Elevated aspartate aminotransferase level 06/16/2015  . H/O nutritional disorder 06/16/2015  . Excessive salivation 06/16/2015  . 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 Heart palpitations 06/16/2015  . 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 Screening for prostate cancer 08/21/2014  . Atypical chest pain 08/10/2013  . 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

## 2018-10-10 ENCOUNTER — Encounter: Payer: Self-pay | Admitting: Registered Nurse

## 2018-10-10 ENCOUNTER — Encounter: Payer: BLUE CROSS/BLUE SHIELD | Attending: Registered Nurse | Admitting: Registered Nurse

## 2018-10-10 ENCOUNTER — Other Ambulatory Visit: Payer: Self-pay

## 2018-10-10 VITALS — BP 125/74 | HR 64

## 2018-10-10 DIAGNOSIS — E785 Hyperlipidemia, unspecified: Secondary | ICD-10-CM | POA: Diagnosis present

## 2018-10-10 DIAGNOSIS — I639 Cerebral infarction, unspecified: Secondary | ICD-10-CM | POA: Insufficient documentation

## 2018-10-10 DIAGNOSIS — G8191 Hemiplegia, unspecified affecting right dominant side: Secondary | ICD-10-CM | POA: Insufficient documentation

## 2018-10-10 LAB — CUP PACEART REMOTE DEVICE CHECK
Date Time Interrogation Session: 20200314220944
Implantable Pulse Generator Implant Date: 20200210

## 2018-10-10 MED ORDER — CLOPIDOGREL BISULFATE 75 MG PO TABS: 75.0000 mg | ORAL_TABLET | Freq: Every day | ORAL | 0 refills | Status: DC

## 2018-10-10 NOTE — Progress Notes (Signed)
Subjective:    Patient ID: Edward Glass, male    DOB: [birth year], 63 y.o.   MRN: [id number]  HPI: Edward Glass is a 63 y.o. male who is here for hospital follow up appointment. He presented to [facility] Department with confusion, difficulty speech and right facial droop, and right sided weakness. Neurology was consulted.  CT Head WO Contrast:  IMPRESSION: 1. 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 2. Right OMC pattern paranasal sinusitis. CT Cerebral Perfusion W Contrast:  IMPRESSION: 1. Negative CTA for large vessel occlusion. No core infarct evident by CT perfusion. 2. Short-segment severe mid left M1 stenosis, likely similar as compared to previous MRI from 11/19/2008. Associated delayed perfusion throughout the left MCA territory distally. 3. No other hemodynamically significant or correctable stenosis within the major arterial vasculature of the head and neck. 4. Persistent left trigeminal artery. MRI Brain WO Contrast IMPRESSION: 1. Multiple small foci of acute/early subacute infarction are present in the left MCA distribution concentrated in left basal ganglia inclusive of the corona radiata and posterior limb of internal capsule. No hemorrhage or mass effect. 2. Mild chronic microvascular ischemic changes and volume loss of the brain. Small chronic infarcts in right parietal and occipital lobes. 3. Right frontal, ethmoid, and maxillary sinus disease is a right middle meatus obstructive pattern, direct visualization recommended. Patient maintained on aspirin and Plavix for CVA prophylaxis.  Has a scheduled followed up appointment with Neurology.  Edward Glass was admitted to inpatient rehabilitation on 09/05/2018 and discharged home on 09/29/2018. He is receiving out patient therapy at [facility].   Edward Glass states he has right wrist pain. He rates his pain. Also reports he has a good appetite. [name] arrived in wheelchair, and states he walks with a cane in his home.   Daughter in room, all questions answered.   Pain Inventory Average Pain 3 Pain Right Now 3 My pain is sharp  In the last 24 hours, has pain interfered with the following? General activity 0 Relation with others 0 Enjoyment of life 0 What TIME of day is your pain at its worst? 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  no  Physicians involved in your care Any changes since last visit?  no   Family History  Problem Relation Age of Onset  . Kidney failure Mother   . Cancer Mother   . Prostate cancer Father   . Cancer Father   . Diabetes Father   . Colon cancer Neg Hx   . Esophageal cancer Neg Hx   . Rectal cancer Neg Hx   . Stomach cancer Neg Hx    Social History   Socioeconomic History  . Marital status: Married    Spouse name: Not on file  . Number of children: Not on file  . Years of education: Not on file  . Highest education level: Not on file  Occupational History  . Not on file  Social Needs  . Financial resource strain: Not hard at all  . Food insecurity:  Worry: Never true    Inability: Never true  . Transportation needs:    Medical: No    Non-medical: No  Tobacco Use  . Smoking status: Never Smoker  . Smokeless tobacco: Never Used  Substance and Sexual Activity  . Alcohol use: No  . Drug use: No  . Sexual activity: Not on file  Lifestyle  . Physical activity:    Days per week: 6 days    Minutes per session: 30 min  . Stress: Not at all  Relationships  . Social  connections:    Talks on phone: Three times a week    Gets together: Three times a week    Attends religious service: Not on file    Active member of club or organization: Yes    Attends meetings of clubs or organizations: 1 to 4 times per year    Relationship status: Not on file  Other Topics Concern  . Not on file  Social History Narrative  . Not on file   Past Surgical History:  Procedure Laterality Date  . HERNIA REPAIR  2006  . left tricep surgery    . LOOP RECORDER INSERTION N/A 09/04/2018   Procedure: LOOP RECORDER INSERTION;  Surgeon: Evans Lance, MD;  Location: [facility];  Service: Cardiovascular;  Laterality: N/A;  . TEE WITHOUT CARDIOVERSION N/A 09/04/2018   Procedure: TRANSESOPHAGEAL ECHOCARDIOGRAM (TEE);  Surgeon: Jerline Pain, MD;  Location: [facility];  Service: Cardiovascular;  Laterality: N/A;   Past Medical History:  Diagnosis Date  . Atypical chest pain 08/10/2013  . Elevated aspartate aminotransferase level 06/16/2015  . Excessive salivation 06/16/2015  . H/O nutritional disorder 06/16/2015  . 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 Screening for prostate cancer 08/21/2014  . 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   HENT: Negative.   Eyes: Negative.   Respiratory: Negative.   Cardiovascular: Negative.   Gastrointestinal: Negative.   Endocrine: Negative.   Genitourinary: Negative.   Musculoskeletal:  Positive for gait problem.  Skin: Negative.   Allergic/Immunologic: Negative.   Neurological: Positive for weakness.  Hematological: Bruises/bleeds easily.       Plavix  Psychiatric/Behavioral: Negative.   All other systems reviewed and are negative.      Objective:   Physical Exam Vitals signs and nursing note reviewed.  Constitutional:      Appearance: Normal appearance.  Neck:     Musculoskeletal: Normal range of motion and neck supple.  Cardiovascular:     Rate and Rhythm: Normal rate and regular rhythm.     Pulses: Normal pulses.     Heart sounds: Normal heart sounds.  Pulmonary:     Effort: Pulmonary effort is normal.     Breath sounds: Normal breath sounds.  Musculoskeletal:     Comments: Normal Muscle Bulk and Muscle Testing Reveals:  Upper Extremities: Right: Decreased ROM 30  Degrees and Muscle Strength 0/5 Left: Full ROM and Muscle Strength 5/5 Lower Extremities: Right: Decreased ROM and Muscle Strength 4/4 Wearing Right AFO Left: Full ROM and Muscle Strength 5/5 Arrived in wheelchair   Skin:    General: Skin is warm and dry.  Neurological:     Mental Status: He is alert and oriented to person, place, and time.  Psychiatric:        Mood and Affect: Mood normal.        Behavior: Behavior normal.           Assessment & Plan:  1. 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 Continue current medication regimen. Continue to Monitor.  2.Dyslipidemia: Continue current medication regimen. PCP Following  30  minutes of face to face patient care time was spent during this visit. All questions were encouraged and answered..    F/U with Dr. Letta Pate in 4-6 weeks.

## 2018-10-11 ENCOUNTER — Encounter: Payer: Self-pay | Admitting: Occupational Therapy

## 2018-10-11 ENCOUNTER — Encounter: Payer: Self-pay | Admitting: Physical Therapy

## 2018-10-11 ENCOUNTER — Ambulatory Visit: Payer: BLUE CROSS/BLUE SHIELD | Admitting: Physical Therapy

## 2018-10-11 ENCOUNTER — Ambulatory Visit: Payer: BLUE CROSS/BLUE SHIELD | Admitting: Occupational Therapy

## 2018-10-11 VITALS — BP 130/78 | HR 63

## 2018-10-11 DIAGNOSIS — M25531 Pain in right wrist: Secondary | ICD-10-CM

## 2018-10-11 DIAGNOSIS — M79641 Pain in right hand: Secondary | ICD-10-CM

## 2018-10-11 DIAGNOSIS — M6281 Muscle weakness (generalized): Secondary | ICD-10-CM

## 2018-10-11 DIAGNOSIS — R2689 Other abnormalities of gait and mobility: Secondary | ICD-10-CM | POA: Diagnosis not present

## 2018-10-11 DIAGNOSIS — I69351 Hemiplegia and hemiparesis following cerebral infarction affecting right dominant side: Secondary | ICD-10-CM

## 2018-10-11 DIAGNOSIS — R2681 Unsteadiness on feet: Secondary | ICD-10-CM

## 2018-10-11 DIAGNOSIS — R29818 Other symptoms and signs involving the nervous system: Secondary | ICD-10-CM

## 2018-10-11 DIAGNOSIS — R293 Abnormal posture: Secondary | ICD-10-CM

## 2018-10-11 DIAGNOSIS — R600 Localized edema: Secondary | ICD-10-CM

## 2018-10-11 DIAGNOSIS — I69318 Other symptoms and signs involving cognitive functions following cerebral infarction: Secondary | ICD-10-CM

## 2018-10-11 DIAGNOSIS — M25511 Pain in right shoulder: Secondary | ICD-10-CM

## 2018-10-11 NOTE — Therapy (Signed)
[facility] [address] [place], [place], [address] Phone: [phone]   Fax:  [phone]  Occupational Therapy Treatment  Patient Details  Name: Edward Glass MRN: [id number] Date of Birth: [birth year] Referring Provider (OT): Dr. Alysia Penna   Encounter Date: 10/11/2018  OT End of Session - 10/11/18 1701    Visit Number  3    Number of Visits  24    Date for OT Re-Evaluation  12/28/18    Authorization Type  [employer] - has 30 visits between OT and PT however if visits occur on same day counts as 1 visit. Discussion with pt re: use of remaining visits at vist 20    OT Start Time  1446    OT Stop Time  1527    OT Time Calculation (min)  41 min    Activity Tolerance  Patient tolerated treatment well       Past Medical History:  Diagnosis Date  . Atypical chest pain 08/10/2013  . Elevated aspartate aminotransferase level 06/16/2015  . Excessive salivation 06/16/2015  . H/O nutritional disorder 06/16/2015  . 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 Screening for prostate cancer 08/21/2014  . 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 HERNIA REPAIR  2006  . left tricep surgery    . LOOP RECORDER INSERTION N/A 09/04/2018   Procedure: LOOP RECORDER INSERTION;  Surgeon: Evans Lance, MD;  Location: [facility];  Service: Cardiovascular;  Laterality: N/A;  . TEE WITHOUT CARDIOVERSION N/A 09/04/2018   Procedure: TRANSESOPHAGEAL ECHOCARDIOGRAM (TEE);  Surgeon: Jerline Pain, MD;  Location: [facility] END[facility]COPY;  Service: Cardiovascular;  Laterality: N/A;    There were no vitals filed for this visit.  Subjective Assessment - 10/11/18 1451    Subjective   My wrist just hurts a little today    Patient is accompanied by:  Family member    Pertinent History  L CVA on 09/14/18  Loop recorder, pt taking blood thinner    Patient Stated Goals  walk better  and use my hand    Currently in Pain?  Yes    Pain Score  3     Pain Location  Wrist    Pain Orientation  Right    Pain Descriptors / Indicators  Tightness    Pain Type  Acute pain    Pain Onset  More than a month ago    Pain Frequency  Intermittent    Aggravating Factors   movement    Pain Relieving Factors  avoding touching and moving it                   OT Treatments/Exercises (OP) - 10/11/18 1657      Splinting   Splinting  Completed splint for RUE today - pt able to close and open fingers with splint on with cues.  Instructed both pt and dtr (dtr in waiting room at end of session) on donning and doffing, wearing schedule and care of splint. Provided in writing - both pt and dtr verbalized understanding.              OT Education - 10/11/18 1659    Education Details  wear and care of splint for RUE    Person(s) Educated  Patient;Child(ren)    Methods  Explanation;Demonstration;Verbal cues;Handout    Comprehension  Verbalized understanding;Returned demonstration       OT Short Term  Goals - 10/11/18 1659      OT SHORT TERM GOAL #1   Title  Pt and family will be mod I  with RUE positioning/care to address edema and pain in RUE - 11/16/2018    Status  On-going      OT SHORT TERM GOAL #2   Title  Pt and family will be mod I with splint wear and care    Status  On-going      OT SHORT TERM GOAL #3   Title  Pt and dtr will be mod I with HEP for RUE for ROM, addressing pain, functional use of RUE prn    Status  On-going      OT SHORT TERM GOAL #4   Title  Pt will report pain in R hand/wrist no greater than 5/10 during HEP.      Status  On-going      OT SHORT TERM GOAL #5   Title  Pt will demonstrate the ability for 55* of shoulder flexion as prep for functional low reach    Status  On-going      OT SHORT TERM GOAL #6   Title  Pt will be mod I with toilet and tub bench transfers    Status  On-going      OT SHORT TERM GOAL #7   Title  Pt will be mod  I with bathing at shower level    Status  On-going        OT Long Term Goals - 10/11/18 1659      OT LONG TERM GOAL #1   Title  Pt and family will be mod I with upgraded home activities program - 12/28/2018    Status  On-going      OT LONG TERM GOAL #2   Title  Pt will demonstrate ability to grasp and hold light weight object in R hand with pain 2/10 or less    Status  On-going      OT LONG TERM GOAL #3   Title  Pt will demonstrate ability for bilateral low reach to obtain light weigh object     Status  On-going      OT LONG TERM GOAL #4   Title  Pt will be mod I with dressing    Status  On-going      OT LONG TERM GOAL #5   Title  Pt will be mod I with cutting food, AE prn    Status  On-going      OT LONG TERM GOAL #6   Title  Pt will demonstrate ability to use RUE as stabilizer at least 75% of the time in simple ADL tasks    Status  On-going      OT LONG TERM GOAL #7   Title  Pt will be mod I with simple snack/be prep at ambulatory level    Status  On-going      OT LONG TERM GOAL #8   Title  Pt will demonstrate improved postural alignment to decrease pain in RUE as well as decrease risk of falls    Status  On-going            Plan - 10/11/18 1700    Clinical Impression Statement  Pt progressing toward goals. Pt with less edema in R hand, and less pain in R hand and wrist since eval.     Occupational performance deficits (Please refer to evaluation for details):  ADL's;IADL's;Rest and Sleep;Work;Leisure;Social Participation  Body Structure / Function / Physical Skills  ADL;Balance;Decreased knowledge of use of DME;Coordination;Edema;GMC;FMC;Flexibility;IADL;Mobility;Pain;ROM;Strength;Tone;UE functional use;Endurance    Comorbidities Affecting Occupational Performance:  Presence of comorbidities impacting occupational performance    Comorbidities impacting occupational performance description:  HTN, atypical chest pain, chronic R shoulder pain, OA of R shoulder,  aphasia    OT Frequency  2x / week    OT Duration  12 weeks    OT Treatment/Interventions  Self-care/ADL training;Aquatic Therapy;Cryotherapy;Therapeutic exercise;Neuromuscular education;Energy conservation;DME and/or AE instruction;Functional Mobility Training;Cognitive remediation/compensation;Balance training;Splinting;Manual Therapy;Passive range of motion;Therapeutic activities;Patient/family education    Plan  check splint, address edema in R hand prn, , initiate HEP as possible, NMR for RUE/trunk/balance    Consulted and Agree with Plan of Care  Patient;Family member/caregiver    Family Member Consulted  dtr       Patient will benefit from skilled therapeutic intervention in order to improve the following deficits and impairments:  Body Structure / Function / Physical Skills, Cognitive Skills  Visit Diagnosis: Hemiplegia and hemiparesis following cerebral infarction affecting right dominant side (HCC)  Other symptoms and signs involving the nervous system  Unsteadiness on feet  Muscle weakness (generalized)  Abnormal posture  Acute pain of right shoulder  Pain in right hand  Pain in right wrist  Localized edema  Other symptoms and signs involving cognitive functions following cerebral infarction    Problem List Patient Active Problem List   Diagnosis Date Noted  . Slow transit constipation   . Benign prostatic hyperplasia with urinary retention   . Right hemiparesis ([place])   . Chronic right shoulder pain   . Primary osteoarthritis of right shoulder   . Left basal ganglia embolic stroke ([place]) 20/23/3435  . CVA (cerebral vascular accident) ([place]) 08/31/2018  . Elevated aspartate aminotransferase level 06/16/2015  . H/O nutritional disorder 06/16/2015  . Excessive salivation 06/16/2015  . 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 Heart palpitations 06/16/2015  . 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 Screening for prostate cancer 08/21/2014   . Atypical chest pain 08/10/2013  . 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

## 2018-10-11 NOTE — Patient Instructions (Signed)
Your Splint This splint should initially be fitted by a healthcare practitioner.  The healthcare practitioner is responsible for providing wearing instructions and precautions to the patient, other healthcare practitioners and care provider involved in the patient's care.  This splint was custom made for you. Please read the following instructions to learn about wearing and caring for your splint.  Precautions Should your splint cause any of the following problems, remove the splint immediately and contact your therapist/physician.  Swelling  Severe Pain  Pressure Areas  Stiffness  Numbness  The purpose of this splint is to support your wrist so it doesn't hang down and reduce the pain in the wrist.  YOU STILL NEED TO KEEP THE HAND ELEVATED AT ALL TIMES!! Use a pillow for now in your wheelchair and support it at nigh in elevation.   When To Wear Your Splint Wednesday:  Wear for one hour until 4:15 pm. When you take if off check your skin. If no problems: Thursday:  Wear for 2 hours in the morning and 2 hours in the afternoon. If no problems: Friday:  Wear for 3 hours in the morning and 3 hours in the afternoon. If no problems: Saturday; Wear for 4 hours in the morning and 4 hours in the afternoon. From this point on you will wear it 4 hours on, an hour off, then 4 hours on.  You do not need to wear this at night.    Care and Cleaning of Your Splint 1. Keep your splint away from open flames. 2. Your splint will lose its shape in temperatures over 135 degrees Farenheit, ( in car windows, near radiators, ovens or in hot water).  Never make any adjustments to your splint, if the splint needs adjusting remove it and make an appointment to see your therapist. 3. Your splint, including the cushion liner may be cleaned with soap and lukewarm water.  Do not immerse in hot water over 135 degrees Farenheit. 4. Straps may be washed with soap and water, but do not moisten the self-adhesive  portion. 5. For ink or hard to remove spots use a scouring cleanser which contains chlorine.  Rinse the splint thoroughly after using chlorine cleanser.

## 2018-10-11 NOTE — Therapy (Signed)
[facility] [address] [place], [place], [address] Phone: [phone]   Fax:  [phone]  Physical Therapy Treatment  Patient Details  Name: Edward Glass MRN: [id number] Date of Birth: [birth year] Referring Provider (PT): Alysia Penna MD   Encounter Date: 10/11/2018  PT End of Session - 10/11/18 1515    Visit Number  3    Number of Visits  17    Date for PT Re-Evaluation  12/02/18    Authorization Type  BCBS; met $7500 deductible; VL: PT/OT 30, zero used; seen on same day =1 visit; SLP 30 visits    PT Start Time  1405    PT Stop Time  1448    PT Time Calculation (min)  43 min    Equipment Utilized During Treatment  Other (comment)   Bioness   Activity Tolerance  Patient tolerated treatment well    Behavior During Therapy  [facility] for tasks assessed/performed       Past Medical History:  Diagnosis Date  . Atypical chest pain 08/10/2013  . Elevated aspartate aminotransferase level 06/16/2015  . Excessive salivation 06/16/2015  . H/O nutritional disorder 06/16/2015  . 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 Screening for prostate cancer 08/21/2014  . 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 HERNIA REPAIR  2006  . left tricep surgery    . LOOP RECORDER INSERTION N/A 09/04/2018   Procedure: LOOP RECORDER INSERTION;  Surgeon: Evans Lance, MD;  Location: [facility];  Service: Cardiovascular;  Laterality: N/A;  . TEE WITHOUT CARDIOVERSION N/A 09/04/2018   Procedure: TRANSESOPHAGEAL ECHOCARDIOGRAM (TEE);  Surgeon: Jerline Pain, MD;  Location: [facility] ENDOSCOPY;  Service: Cardiovascular;  Laterality: N/A;    Vitals:   10/11/18 1405  BP: 130/78  Pulse: 63    Subjective Assessment - 10/11/18 1405    Subjective  Daughter brought pt but no in waiting area with him.  Reports daughter still walks with him at home without any issues.  Saw  PCP, no changes to medications or recommendations.  No issues with HEP at home per pt report.    Pertinent History  chronic rt shoulder pain; had recent fall with rt wrist pain, xray negative for fracture and had steroid injection 09/27/18    Limitations  Walking    How long can you walk comfortably?  150 ft    Diagnostic tests  MRI-Multiple small foci of acute/early subacute infarctions present in the left MCA distribution concentrated in left basal ganglia; Small chronic infarcts in right parietal and occipital lobes    Patient Stated Goals  improve his walking and balance, be able to live on his own again in his 2 story house    Currently in Pain?  Yes    Pain Score  3     Pain Location  Wrist    Pain Orientation  Right    Pain Descriptors / Indicators  Tightness    Pain Onset  More than a month ago                       [facility] Adult PT Treatment/Exercise - 10/11/18 1508      Ambulation/Gait   Ambulation/Gait  Yes    Ambulation/Gait Assistance  4: Min assist    Ambulation/Gait Assistance Details  with Bioness in gait mode and therapist providing facilitation for more upright posture, control of genu recurvatum  by progressing tibia over foot and cues for terminal hip extension during stance phase to advance COG forwards over [name].  Performed over ground with cane and in // bars after performing NMR    Ambulation Distance (Feet)  115 Feet    Assistive device  Small based quad cane;Parallel bars    Gait Pattern  Step-to pattern;Step-through pattern;Decreased step length - left;Decreased stance time - right;Decreased stride length;Decreased hip/knee flexion - right;Decreased dorsiflexion - right;Right genu recurvatum;Trunk flexed;Poor foot clearance - right    Ambulation Surface  Level;Indoor      Neuro Re-ed    Neuro Re-ed Details   NMR in // bars with Bioness in training mode on for 8, off for 8 with R foot on balance beam.  When Bioness on cued pt for full R lateral weight  shift, full closed chain ankle DF to bring tibia over foot and terminal hip extension in mid and terminal stance in order to take a full step length with LLE.  Min A from therapist to perform.  Performed x 12 repetitions forwards and backward stepping with LLE      Modalities   Modalities  Electrical Stimulation      Electrical Stimulation   Electrical Stimulation Location  R anterior tibialis    Electrical Stimulation Action  closed and open chain ankle DF    Electrical Stimulation Parameters  See saved parameters in Tablet 2; quick fit electrode    Electrical Stimulation Goals  Strength;Neuromuscular facilitation             PT Education - 10/11/18 1515    Education Details  Bioness for LE strengthening and NMR    Person(s) Educated  Patient    Methods  Explanation    Comprehension  Verbalized understanding       PT Short Term Goals - 10/04/18 0728      PT SHORT TERM GOAL #1   Title  Patient will be independent with HEP (Target all STGs 4 weeks 11/02/2018)      PT SHORT TERM GOAL #2   Title  Patient will improve gait velocity to 0.75 ft/sec demonstrating progress towards decr fall risk    Baseline  3/10 0.34 ft/sec      PT SHORT TERM GOAL #3   Title  Patient will improve 5x sit to stand to <18 seconds demonstrating progress towards decr fall risk    Baseline  3/9  24.62 sec      PT SHORT TERM GOAL #4   Title  Patient will ambulate modified independent over level surfaces with LRAD x 200 ft, including avoiding objects on his right       PT SHORT TERM GOAL #5   Title  Patient will ambulate on ramp and up/down curb with close-guarding and LRAD        PT Long Term Goals - 10/04/18 0735      PT LONG TERM GOAL #1   Title  Patient will be independent with updated HEP and verbalize plan for continued community-based exercise plan (Target all LTGs 8 weeks, 12/02/2018)      PT LONG TERM GOAL #2   Title  Patient will improve gait velocity to 1.30 ft/sec demonstrating  progress towards decr fall risk      PT LONG TERM GOAL #3   Title  Patient will improve 5x sit to stand to <12 seconds demonstrating decr fall risk      PT LONG TERM GOAL #4   Title  Patient will  ambulate over outdoor paved surfaces modified independent with LRAD, including up/down curb.       PT LONG TERM GOAL #5   Title  Patient will ambulate >300 ft over level indoor surface in <= 6 minutes (6MWT)      Additional Long Term Goals   Additional Long Term Goals  Yes      PT LONG TERM GOAL #6   Title  Patient will go up/down 12 steps with single rail modified independent             Plan - 10/11/18 1516    Clinical Impression Statement  Continued NMR and gait training with addition of Bioness lower cuff on [name] for DF assistance in open and closed chain.  Continued to require assistance to prevent R genu recurvatum in stance phase.  Will add hamstring cuff at next session.    Personal Factors and Comorbidities  Comorbidity 1    Comorbidities  chronic rt shoulder pain    Examination-Activity Limitations  Bathing;Bed Mobility;Caring for Others;Carry;Dressing;Locomotion Level;Lift;Reach Overhead;Self Feeding;Stairs    Examination-Participation Restrictions  Community Activity;Driving    Stability/Clinical Decision Making  Stable/Uncomplicated    Rehab Potential  Good    PT Frequency  2x / week    PT Duration  8 weeks    PT Treatment/Interventions  ADLs/Self Care Home Management;Aquatic Therapy;Electrical Stimulation;DME Instruction;Gait training;Stair training;Functional mobility training;Therapeutic activities;Therapeutic exercise;Balance training;Orthotic Fit/Training;Patient/family education;Neuromuscular re-education;Wheelchair mobility training;Passive range of motion;Visual/perceptual remediation/compensation    PT Next Visit Plan  Bioness - add hamstring upper cuff to [name].  Continue NMR and pre-gait training with focus on weight shift to R, hamstring activation, terminal hip  extension.  Exercises with Bioness.  Standing balance.    Consulted and Agree with Plan of Care  Patient;Family member/caregiver    Family Member Consulted  daughter       Patient will benefit from skilled therapeutic intervention in order to improve the following deficits and impairments:  Abnormal gait, Decreased activity tolerance, Decreased balance, Decreased endurance, Decreased coordination, Decreased cognition, Decreased knowledge of use of DME, Decreased mobility, Decreased safety awareness, Difficulty walking, Decreased strength, Increased edema, Impaired perceived functional ability, Impaired UE functional use  Visit Diagnosis: Hemiplegia and hemiparesis following cerebral infarction affecting right dominant side (HCC)  Other symptoms and signs involving the nervous system  Other abnormalities of gait and mobility  Unsteadiness on feet     Problem List Patient Active Problem List   Diagnosis Date Noted  . Slow transit constipation   . Benign prostatic hyperplasia with urinary retention   . Right hemiparesis ([place])   . Chronic right shoulder pain   . Primary osteoarthritis of right shoulder   . Left basal ganglia embolic stroke ([place]) 94/76/5465  . CVA (cerebral vascular accident) ([place]) 08/31/2018  . Elevated aspartate aminotransferase level 06/16/2015  . H/O nutritional disorder 06/16/2015  . Excessive salivation 06/16/2015  . 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 Heart palpitations 06/16/2015  . 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 Screening for prostate cancer 08/21/2014  . Atypical chest pain 08/10/2013  . 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

## 2018-10-13 ENCOUNTER — Ambulatory Visit: Payer: BLUE CROSS/BLUE SHIELD

## 2018-10-16 ENCOUNTER — Telehealth: Payer: Self-pay | Admitting: Occupational Therapy

## 2018-10-16 NOTE — Telephone Encounter (Signed)
Mr. Sizemore was contacted today regarding the temporary closing of [facility] due to Covid-19.  Therapist ensured pt knew that the clinic is closed at least for the next two weeks and then this will be reassessed.  Made sure pt knew we would be contacting pt once we reopen.  Pt stated he had no current questions.   Patient was interested in further information for an e-visit, virtual check in, or telehealth visit, if those services become available.    [facility] will follow up with patients when we are able to resume care.  Forde Radon, OTR/L [facility] [address] [place], [place]  [address] Phone:  [phone] Fax:  [phone] \

## 2018-10-17 ENCOUNTER — Ambulatory Visit: Payer: BLUE CROSS/BLUE SHIELD | Admitting: Occupational Therapy

## 2018-10-17 ENCOUNTER — Ambulatory Visit: Payer: BLUE CROSS/BLUE SHIELD | Admitting: Physical Therapy

## 2018-10-17 NOTE — Progress Notes (Signed)
[facility] Summary Report / Loop Recorder 

## 2018-10-20 ENCOUNTER — Encounter: Payer: BLUE CROSS/BLUE SHIELD | Admitting: Occupational Therapy

## 2018-10-20 ENCOUNTER — Ambulatory Visit: Payer: BLUE CROSS/BLUE SHIELD | Admitting: Physical Therapy

## 2018-10-24 ENCOUNTER — Ambulatory Visit: Payer: BLUE CROSS/BLUE SHIELD | Admitting: Physical Therapy

## 2018-10-24 ENCOUNTER — Other Ambulatory Visit: Payer: Self-pay

## 2018-10-24 ENCOUNTER — Ambulatory Visit: Payer: BLUE CROSS/BLUE SHIELD | Admitting: Occupational Therapy

## 2018-10-24 ENCOUNTER — Ambulatory Visit: Payer: BLUE CROSS/BLUE SHIELD

## 2018-10-24 ENCOUNTER — Encounter: Payer: Self-pay | Admitting: Occupational Therapy

## 2018-10-24 ENCOUNTER — Encounter: Payer: Self-pay | Admitting: Physical Therapy

## 2018-10-24 DIAGNOSIS — R1312 Dysphagia, oropharyngeal phase: Secondary | ICD-10-CM

## 2018-10-24 DIAGNOSIS — R2689 Other abnormalities of gait and mobility: Secondary | ICD-10-CM

## 2018-10-24 DIAGNOSIS — R41841 Cognitive communication deficit: Secondary | ICD-10-CM

## 2018-10-24 DIAGNOSIS — R471 Dysarthria and anarthria: Secondary | ICD-10-CM

## 2018-10-24 DIAGNOSIS — R29818 Other symptoms and signs involving the nervous system: Secondary | ICD-10-CM

## 2018-10-24 DIAGNOSIS — M25511 Pain in right shoulder: Secondary | ICD-10-CM

## 2018-10-24 DIAGNOSIS — R4701 Aphasia: Secondary | ICD-10-CM

## 2018-10-24 DIAGNOSIS — R2681 Unsteadiness on feet: Secondary | ICD-10-CM

## 2018-10-24 DIAGNOSIS — M25531 Pain in right wrist: Secondary | ICD-10-CM

## 2018-10-24 DIAGNOSIS — R293 Abnormal posture: Secondary | ICD-10-CM

## 2018-10-24 DIAGNOSIS — I69351 Hemiplegia and hemiparesis following cerebral infarction affecting right dominant side: Secondary | ICD-10-CM

## 2018-10-24 DIAGNOSIS — M6281 Muscle weakness (generalized): Secondary | ICD-10-CM

## 2018-10-24 DIAGNOSIS — M79641 Pain in right hand: Secondary | ICD-10-CM

## 2018-10-24 DIAGNOSIS — R600 Localized edema: Secondary | ICD-10-CM

## 2018-10-24 DIAGNOSIS — I69318 Other symptoms and signs involving cognitive functions following cerebral infarction: Secondary | ICD-10-CM

## 2018-10-24 NOTE — Therapy (Signed)
[facility] [address] [place], [place], [address] Phone: [phone]   Fax:  [phone]  Occupational Therapy Treatment  Patient Details  Name: Edward Glass MRN: [id number] Date of Birth: Jan 24, 1956 Referring Provider (OT): Dr. Alysia Penna   Encounter Date: 10/24/2018  OT End of Session - 10/24/18 1318    Visit Number  4    Number of Visits  24    Date for OT Re-Evaluation  12/28/18    Authorization Type  BCBS - has 30 visits between OT and PT however if visits occur on same day counts as 1 visit. Discussion with pt re: use of remaining visits at vist 20    OT Start Time  1105    OT Stop Time  1146    OT Time Calculation (min)  41 min    Activity Tolerance  Patient tolerated treatment well    Behavior During Therapy  WFL for tasks assessed/performed       Past Medical History:  Diagnosis Date  . Atypical chest pain 08/10/2013  . Elevated aspartate aminotransferase level 06/16/2015  . Excessive salivation 06/16/2015  . H/O nutritional disorder 06/16/2015  . 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 Screening for prostate cancer 08/21/2014  . 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 HERNIA REPAIR  2006  . left tricep surgery    . LOOP RECORDER INSERTION N/A 09/04/2018   Procedure: LOOP RECORDER INSERTION;  Surgeon: Evans Lance, MD;  Location: [facility];  Service: Cardiovascular;  Laterality: N/A;  . TEE WITHOUT CARDIOVERSION N/A 09/04/2018   Procedure: TRANSESOPHAGEAL ECHOCARDIOGRAM (TEE);  Surgeon: Jerline Pain, MD;  Location: [facility] ENDOSCOPY;  Service: Cardiovascular;  Laterality: N/A;    There were no vitals filed for this visit.  Subjective Assessment - 10/24/18 1314    Subjective   Pt reports splint rubbing/putting pressure on 2nd MP    Patient is accompanied by:  Family member    Pertinent History  L CVA on 09/14/18   Loop recorder, pt taking blood thinner    Patient Stated Goals  walk better and use my hand    Currently in Pain?  No/denies    Pain Onset  More than a month ago        Modified pt's wrist cock-up splint to move dorsal bar down off 2nd MP as able, flare bar, and add padding as able.  Pt reports incr comfort after modifications.       OT Education - 10/24/18 1315    Education Details  initial HEP--see pt instructions    Person(s) Educated  Patient    Methods  Explanation;Tactile cues;Demonstration;Verbal cues;Handout    Comprehension  Verbalized understanding;Returned demonstration;Verbal cues required;Need further instruction       OT Short Term Goals - 10/11/18 1659      OT SHORT TERM GOAL #1   Title  Pt and family will be mod I  with RUE positioning/care to address edema and pain in RUE - 11/16/2018    Status  On-going      OT SHORT TERM GOAL #2   Title  Pt and family will be mod I with splint wear and care    Status  On-going      OT SHORT TERM GOAL #3   Title  Pt and dtr will be mod I with HEP for RUE for ROM, addressing pain, functional use of RUE  prn    Status  On-going      OT SHORT TERM GOAL #4   Title  Pt will report pain in R hand/wrist no greater than 5/10 during HEP.      Status  On-going      OT SHORT TERM GOAL #5   Title  Pt will demonstrate the ability for 55* of shoulder flexion as prep for functional low reach    Status  On-going      OT SHORT TERM GOAL #6   Title  Pt will be mod I with toilet and tub bench transfers    Status  On-going      OT SHORT TERM GOAL #7   Title  Pt will be mod I with bathing at shower level    Status  On-going        OT Long Term Goals - 10/11/18 1659      OT LONG TERM GOAL #1   Title  Pt and family will be mod I with upgraded home activities program - 12/28/2018    Status  On-going      OT LONG TERM GOAL #2   Title  Pt will demonstrate ability to grasp and hold light weight object in R hand with pain 2/10 or less     Status  On-going      OT LONG TERM GOAL #3   Title  Pt will demonstrate ability for bilateral low reach to obtain light weigh object     Status  On-going      OT LONG TERM GOAL #4   Title  Pt will be mod I with dressing    Status  On-going      OT LONG TERM GOAL #5   Title  Pt will be mod I with cutting food, AE prn    Status  On-going      OT LONG TERM GOAL #6   Title  Pt will demonstrate ability to use RUE as stabilizer at least 75% of the time in simple ADL tasks    Status  On-going      OT LONG TERM GOAL #7   Title  Pt will be mod I with simple snack/be prep at ambulatory level    Status  On-going      OT LONG TERM GOAL #8   Title  Pt will demonstrate improved postural alignment to decrease pain in RUE as well as decrease risk of falls    Status  On-going            Plan - 10/24/18 1319    Clinical Impression Statement  Pt progressing toward goals with decr pain and decr edema in R hand.      Occupational performance deficits (Please refer to evaluation for details):  ADL's;IADL's;Rest and Sleep;Work;Leisure;Social Participation    Body Structure / Function / Physical Skills  ADL;Balance;Decreased knowledge of use of DME;Coordination;Edema;GMC;FMC;Flexibility;IADL;Mobility;Pain;ROM;Strength;Tone;UE functional use;Endurance    Comorbidities Affecting Occupational Performance:  Presence of comorbidities impacting occupational performance    Comorbidities impacting occupational performance description:  HTN, atypical chest pain, chronic R shoulder pain, OA of R shoulder, aphasia    OT Frequency  2x / week    OT Duration  12 weeks    OT Treatment/Interventions  Self-care/ADL training;Aquatic Therapy;Cryotherapy;Therapeutic exercise;Neuromuscular education;Energy conservation;DME and/or AE instruction;Functional Mobility Training;Cognitive remediation/compensation;Balance training;Splinting;Manual Therapy;Passive range of motion;Therapeutic activities;Patient/family  education    Plan  check splint, review and update HEP as able, NMR for RUE/trunk/balance    Consulted and  Agree with Plan of Care  Patient;Family member/caregiver    Family Member Consulted  dtr       Patient will benefit from skilled therapeutic intervention in order to improve the following deficits and impairments:  Body Structure / Function / Physical Skills, Cognitive Skills  Visit Diagnosis: Hemiplegia and hemiparesis following cerebral infarction affecting right dominant side (HCC)  Other symptoms and signs involving the nervous system  Unsteadiness on feet  Muscle weakness (generalized)  Abnormal posture  Acute pain of right shoulder  Pain in right hand  Pain in right wrist  Localized edema  Other symptoms and signs involving cognitive functions following cerebral infarction  Other abnormalities of gait and mobility    Problem List Patient Active Problem List   Diagnosis Date Noted  . Slow transit constipation   . Benign prostatic hyperplasia with urinary retention   . Right hemiparesis ([place])   . Chronic right shoulder pain   . Primary osteoarthritis of right shoulder   . Left basal ganglia embolic stroke ([place]) 01/00/7121  . CVA (cerebral vascular accident) ([place]) 08/31/2018  . Elevated aspartate aminotransferase level 06/16/2015  . H/O nutritional disorder 06/16/2015  . Excessive salivation 06/16/2015  . 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 Heart palpitations 06/16/2015  . 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 Screening for prostate cancer 08/21/2014  . Atypical chest pain 08/10/2013  . 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 [place], [place], [address] Phone: [phone]   Fax:  [phone]  Name: Edward Glass MRN: [id number] Date of Birth: 01/03/1956   Vianne Bulls, OTR/L [facility] [address] [place], [place]  [address] [phone] phone [phone] 10/24/18 1:20 PM

## 2018-10-24 NOTE — Therapy (Signed)
[facility] [address] [place], [place], [address] Phone: [phone]   Fax:  [phone]  Speech Language Pathology Treatment  Patient Details  Name: Edward Glass MRN: [id number] Date of Birth: [birth year] Referring Provider (SLP): Alysia Penna, MD   Encounter Date: 10/24/2018  End of Session - 10/24/18 1335    Visit Number  2    Number of Visits  21    Date for SLP Re-Evaluation  12/27/18    Authorization Type  30 visits ST (20 planned, save 10?)    Authorization - Visit Number  2    Authorization - Number of Visits  30    SLP Start Time  1020    SLP Stop Time   1100    SLP Time Calculation (min)  40 min    Activity Tolerance  Patient tolerated treatment well       Past Medical History:  Diagnosis Date  . Atypical chest pain 08/10/2013  . Elevated aspartate aminotransferase level 06/16/2015  . Excessive salivation 06/16/2015  . H/O nutritional disorder 06/16/2015  . 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 Screening for prostate cancer 08/21/2014  . 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 HERNIA REPAIR  2006  . left tricep surgery    . LOOP RECORDER INSERTION N/A 09/04/2018   Procedure: LOOP RECORDER INSERTION;  Surgeon: Evans Lance, MD;  Location: [facility];  Service: Cardiovascular;  Laterality: N/A;  . TEE WITHOUT CARDIOVERSION N/A 09/04/2018   Procedure: TRANSESOPHAGEAL ECHOCARDIOGRAM (TEE);  Surgeon: Jerline Pain, MD;  Location: [facility] END[facility];  Service: Cardiovascular;  Laterality: N/A;    There were no vitals filed for this visit.  Subjective Assessment - 10/24/18 1022    Subjective  "Getting more talking" (pt, when asked what were compensations we spoke of last session for speech)    Currently in Pain?  No/denies            ADULT SLP TREATMENT - 10/24/18 1031      General Information   Behavior/Cognition  Alert;Cooperative;Lethargic;Distractible   flat affect     Treatment Provided   Treatment provided  Cognitive-Linquistic      Cognitive-Linquistic Treatment   Treatment focused on  Aphasia    Skilled Treatment  Due to aphasia diagnosis previously mentioned in chart, [place] Naming Test was administered today with pt scoring 41/60, greater than two standard deviations below the mean. Pt endorsed having word-finding difficulties in conversation. Goal/s have been added as appropriate.  SLP twice had to cue pt to swallow in the first 6-7 minutes of the evaluation, but pt did not require cues after the second SLP cue.       Assessment / Recommendations / Plan   Plan  Continue with current plan of care;Goals updated      Progression Toward Goals   Progression toward goals  Progressing toward goals       SLP Education - 10/24/18 1334    Education Details  speech compensations    Person(s) Educated  Patient    Methods  Explanation;Demonstration    Comprehension  Verbalized understanding;Need further instruction;Returned demonstration;Verbal cues required       SLP Short Term Goals - 10/24/18 1044      SLP SHORT TERM GOAL #1   Title  Pt will undergo naming assessment and/or cognitive linguistic assessment PRN    Baseline  BNT: 10-24-18  Time  3    Period  Weeks    Status  Partially Met      SLP SHORT TERM GOAL #2   Title  pt will demo improved conversational volume in sentence responses- average mid-upper 60s dB, over two sessions    Time  5    Period  Weeks    Status  On-going      SLP SHORT TERM GOAL #3   Title  pt will demo improved conversational volume in 5 mintues simple conversation with average mid-upper 60s dB, over three sessions    Time  5    Period  Weeks    Status  On-going      SLP SHORT TERM GOAL #4   Title  pt will demo abdominal breathing 60% of the time in 5 minutes conversation over three sessions    Time  5    Period  Weeks    Status   On-going      SLP SHORT TERM GOAL #5   Title  pt will name 3 non-physical deficits over three sessions    Time  5    Period  Weeks    Status  On-going      Additional Short Term Goals   Additional Short Term Goals  Yes      SLP SHORT TERM GOAL #6   Title  pt/family will provide 3 overt s/s aspiration PNA with modified independence over 2 sessions    Time  3    Period  Weeks    Status  On-going      SLP SHORT TERM GOAL #7   Title  pt will complete simple to mod complex naming tasks pertinent to pt's interests and life experience/s 85% over three sessions    Time  5    Period  Weeks    Status  New       SLP Long Term Goals - 10/24/18 1340      SLP LONG TERM GOAL #1   Title  pt will demo improved conversational volume in 8 minutes simple conversation average upper 60s-low 70s dB, over three sessions    Time  10    Period  Weeks   or 21 total visits, for all LTGs   Status  New      SLP LONG TERM GOAL #2   Title  pt will demo abdominal breathing 70% of the time in 5 minutes conversation over three sessions    Time  10    Period  Weeks    Status  New      SLP LONG TERM GOAL #3   Title  pt will complete a functional, simple organizational task for 25 minutes demonstrating adequate selelctive attention with modified indpendence, over three sessions    Time  10    Period  Weeks    Status  New      SLP LONG TERM GOAL #4   Title  pt will double check answers with speech therapy tasks (anticipatory awareness) with nonverbal cues over three sessions    Time  10    Status  New      SLP LONG TERM GOAL #5   Title  SLP will monitor pt to foster cont'd pulmonary health    Time  10    Period  Weeks    Status  New      Additional Long Term Goals   Additional Long Term Goals  Yes      SLP  LONG TERM GOAL #6   Title  pt will demo functional word finding skills in 8 minutes simple conversation for WNL conversational fluidity over 3 sessions    Time  10    Period  Weeks     Status  New       Plan - 10/24/18 1335    Clinical Impression Statement  Pt presents today with 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 Pt also continues to present with cognitive communication deficits, and testing today confirmed he is also having word finding deficits (scored 41/60 on [place], WNL=/> 49/60)). Cognitive communciation is appropriate in the near future.  Pt would cont to benefit from skilled ST addressing improved speech intelligibilty, an increase in cognitive communication skills, and more fluid conversation due to improved word finding. Pt would also benefit from skilled ST to learn and use compensations for his deficits, routinely.     Speech Therapy Frequency  2x / week    Duration  --   10 weeks or 21 total visits   Treatment/Interventions  Oral motor exercises;Cueing hierarchy;Environmental controls;Compensatory techniques;Cognitive reorganization;Functional tasks;SLP instruction and feedback;Patient/family education;Internal/external aids;Aspiration precaution training;Pharyngeal strengthening exercises;Diet toleration management by SLP;Trials of upgraded texture/liquids    Potential to Achieve Goals  Good    Potential Considerations  Ability to learn/carryover information;Severity of impairments       Patient will benefit from skilled therapeutic intervention in order to improve the following deficits and impairments:   Cognitive communication deficit  Aphasia  Dysarthria and anarthria  Dysphagia, oropharyngeal phase    Problem List Patient Active Problem List   Diagnosis Date Noted  . Slow transit constipation   . Benign prostatic hyperplasia with urinary retention   . Right hemiparesis ([place])   . Chronic right shoulder pain   . Primary osteoarthritis of right shoulder   . Left basal ganglia  embolic stroke ([place]) 74/16/3845  . CVA (cerebral vascular accident) ([place]) 08/31/2018  . Elevated aspartate aminotransferase level 06/16/2015  . H/O nutritional disorder 06/16/2015  . Excessive salivation 06/16/2015  . 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 Heart palpitations 06/16/2015  . 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 Screening for prostate cancer 08/21/2014  . Atypical chest pain 08/10/2013  . 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 [place], [place], [address] Phone: [phone]   Fax:  [phone]   Name: Edward Glass MRN: [id number] Date of Birth: May 13, 1956

## 2018-10-24 NOTE — Patient Instructions (Signed)
  Please complete the assigned speech therapy homework prior to your next session and return it to the speech therapist at your next visit.  

## 2018-10-24 NOTE — Therapy (Signed)
[facility] [address] [place], [place], [address] Phone: [phone]   Fax:  [phone]  Physical Therapy Treatment  Patient Details  Name: Edward Glass MRN: [id number] Date of Birth: [birth year] Referring Provider (PT): Alysia Penna MD   Encounter Date: 10/24/2018  PT End of Session - 10/24/18 1407    Visit Number  4    Number of Visits  17    Date for PT Re-Evaluation  12/02/18    Authorization Type  [employer]; met $7500 deductible; VL: PT/OT 30, zero used; seen on same day =1 visit; SLP 30 visits    PT Start Time  1148    PT Stop Time  1232    PT Time Calculation (min)  44 min    Equipment Utilized During Treatment  Other (comment)   Bioness   Activity Tolerance  Patient tolerated treatment well    Behavior During Therapy  [facility] for tasks assessed/performed       Past Medical History:  Diagnosis Date  . Atypical chest pain 08/10/2013  . Elevated aspartate aminotransferase level 06/16/2015  . Excessive salivation 06/16/2015  . H/O nutritional disorder 06/16/2015  . 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 Screening for prostate cancer 08/21/2014  . 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 HERNIA REPAIR  2006  . left tricep surgery    . LOOP RECORDER INSERTION N/A 09/04/2018   Procedure: LOOP RECORDER INSERTION;  Surgeon: Evans Lance, MD;  Location: [facility];  Service: Cardiovascular;  Laterality: N/A;  . TEE WITHOUT CARDIOVERSION N/A 09/04/2018   Procedure: TRANSESOPHAGEAL ECHOCARDIOGRAM (TEE);  Surgeon: Jerline Pain, MD;  Location: [facility] ENDOSCOPY;  Service: Cardiovascular;  Laterality: N/A;    There were no vitals filed for this visit.  Subjective Assessment - 10/24/18 1146    Subjective  Feel like I get a little off balance with walking.  No falls.    Pertinent History  chronic rt shoulder pain; had recent fall with rt  wrist pain, xray negative for fracture and had steroid injection 09/27/18    Limitations  Walking    How long can you walk comfortably?  150 ft    Diagnostic tests  MRI-Multiple small foci of acute/early subacute infarctions present in the left MCA distribution concentrated in left basal ganglia; Small chronic infarcts in right parietal and occipital lobes    Patient Stated Goals  improve his walking and balance, be able to live on his own again in his 2 story house    Currently in Pain?  No/denies    Pain Onset  More than a month ago                       [facility] Adult PT Treatment/Exercise - 10/24/18 0001      Transfers   Transfers  Sit to Stand;Stand to Sit    Sit to Stand  5: Supervision;Without upper extremity assist;From bed    Sit to Stand Details  Verbal cues for sequencing;Verbal cues for technique;Tactile cues for sequencing;Tactile cues for posture    Stand to Sit  5: Supervision;Without upper extremity assist;To bed;To chair/3-in-1    Stand to Sit Details (indicate cue type and reason)  Tactile cues for sequencing;Verbal cues for sequencing;Verbal cues for technique    Number of Reps  Other reps (comment);2 sets    Transfer Cueing  Cues upon standing for increased  weightshift to [name]      Ambulation/Gait   Ambulation/Gait  Yes    Ambulation/Gait Assistance  4: Min assist    Ambulation Distance (Feet)  100 Feet   30   Assistive device  Small based quad cane;Parallel bars    Gait Pattern  Step-to pattern;Step-through pattern;Decreased step length - left;Decreased stance time - right;Decreased stride length;Decreased hip/knee flexion - right;Decreased dorsiflexion - right;Right genu recurvatum;Trunk flexed;Poor foot clearance - right    Ambulation Surface  Level;Indoor    Pre-Gait Activities  In parallel bars, using mirror as visual cue:  lateral weightshifting to increase [name] weightshift, x 10 reps.  Terminal knee extension, active, [name] x 10 reps in standing.    Gait  Comments  Gait in parallel bars, forward 12 ft x 2, backward 12 ft with LUE support, tactile cues at quads/hamstings for upright posture through [name] as stance leg      Exercises   Exercises  Knee/Hip      Knee/Hip Exercises: Seated   Long Arc Quad  Strengthening;Right;1 set;5 reps   then 1 set 10 reps   Long Arc Quad Limitations  Cues for correct technique, to avoid excess trunk motion and to avoid combining LAQ and heel slide    Heel Slides  Strengthening;Right;2 sets;10 reps   pillow case under [name]   Heel Slides Limitations  --   cues for relaxed trunk, to avoid excess trunk motion   Other Seated Knee/Hip Exercises  seated heel dig/kick in manual wheelchair, x 15 ft, with assistance, to propel w/c forward using lower extremities only               PT Short Term Goals - 10/04/18 3094      PT SHORT TERM GOAL #1   Title  Patient will be independent with HEP (Target all STGs 4 weeks 11/02/2018)      PT SHORT TERM GOAL #2   Title  Patient will improve gait velocity to 0.75 ft/sec demonstrating progress towards decr fall risk    Baseline  3/10 0.34 ft/sec      PT SHORT TERM GOAL #3   Title  Patient will improve 5x sit to stand to <18 seconds demonstrating progress towards decr fall risk    Baseline  3/9  24.62 sec      PT SHORT TERM GOAL #4   Title  Patient will ambulate modified independent over level surfaces with LRAD x 200 ft, including avoiding objects on his right       PT SHORT TERM GOAL #5   Title  Patient will ambulate on ramp and up/down curb with close-guarding and LRAD        PT Long Term Goals - 10/04/18 0735      PT LONG TERM GOAL #1   Title  Patient will be independent with updated HEP and verbalize plan for continued community-based exercise plan (Target all LTGs 8 weeks, 12/02/2018)      PT LONG TERM GOAL #2   Title  Patient will improve gait velocity to 1.30 ft/sec demonstrating progress towards decr fall risk      PT LONG TERM GOAL #3   Title   Patient will improve 5x sit to stand to <12 seconds demonstrating decr fall risk      PT LONG TERM GOAL #4   Title  Patient will ambulate over outdoor paved surfaces modified independent with LRAD, including up/down curb.       PT LONG TERM GOAL #5  Title  Patient will ambulate >300 ft over level indoor surface in <= 6 minutes (6MWT)      Additional Long Term Goals   Additional Long Term Goals  Yes      PT LONG TERM GOAL #6   Title  Patient will go up/down 12 steps with single rail modified independent             Plan - 10/24/18 1408    Clinical Impression Statement  Addressed [name] weakness through standing weightshifting, NMR in parallel bars and gait training.  Even though pt using AFO and heelwedge on [name], pt very fearful of R knee buckling or hyperextending, and this may be one reason for decreased [name] weightshifting and decreased [name] stance time with gait.  In parallel bars and with gait training, PT provides tactile cues for quads and support behind knee to prevent buckling.  Reviewed pt's seated HEP, providing cues for technique for quads and hamstring exercises.    Personal Factors and Comorbidities  Comorbidity 1    Comorbidities  chronic rt shoulder pain    Examination-Activity Limitations  Bathing;Bed Mobility;Caring for Others;Carry;Dressing;Locomotion Level;Lift;Reach Overhead;Self Feeding;Stairs    Examination-Participation Restrictions  Community Activity;Driving    Stability/Clinical Decision Making  Stable/Uncomplicated    Rehab Potential  Good    PT Frequency  2x / week    PT Duration  8 weeks    PT Treatment/Interventions  ADLs/Self Care Home Management;Aquatic Therapy;Electrical Stimulation;DME Instruction;Gait training;Stair training;Functional mobility training;Therapeutic activities;Therapeutic exercise;Balance training;Orthotic Fit/Training;Patient/family education;Neuromuscular re-education;Wheelchair mobility training;Passive range of motion;Visual/perceptual  remediation/compensation    PT Next Visit Plan  Bioness - add hamstring upper cuff to [name] as able.  Consider trying tall kneel/half kneel positions for hamstring/glut activation and improved [name] weightshifting; Continue NMR and pre-gait training with focus on weight shift to R, hamstring activation, terminal hip extension.  Exercises with Bioness.  Standing balance.    Consulted and Agree with Plan of Care  Patient;Family member/caregiver    Family Member Consulted          Patient will benefit from skilled therapeutic intervention in order to improve the following deficits and impairments:  Abnormal gait, Decreased activity tolerance, Decreased balance, Decreased endurance, Decreased coordination, Decreased cognition, Decreased knowledge of use of DME, Decreased mobility, Decreased safety awareness, Difficulty walking, Decreased strength, Increased edema, Impaired perceived functional ability, Impaired UE functional use  Visit Diagnosis: Other abnormalities of gait and mobility  Unsteadiness on feet  Muscle weakness (generalized)     Problem List Patient Active Problem List   Diagnosis Date Noted  . Slow transit constipation   . Benign prostatic hyperplasia with urinary retention   . Right hemiparesis ([place])   . Chronic right shoulder pain   . Primary osteoarthritis of right shoulder   . Left basal ganglia embolic stroke ([place]) 42/70/6237  . CVA (cerebral vascular accident) ([place]) 08/31/2018  . Elevated aspartate aminotransferase level 06/16/2015  . H/O nutritional disorder 06/16/2015  . Excessive salivation 06/16/2015  . 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 Heart palpitations 06/16/2015  . 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 Screening for prostate cancer 08/21/2014  . Atypical chest pain 08/10/2013  . 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 10/24/2018, 2:17 PM  Frazier Butt., PT  [facility] [address] [place], [place], [address] Phone: [phone]   Fax:  [phone]  Name: Edward Glass MRN: [id number] Date of Birth: 1956-07-10

## 2018-10-24 NOTE — Patient Instructions (Signed)
Upper Extremity: [employer]  down.  Hold a ball/shoe box/pillow (shoulder width sized)  with arms straight and hands flat on either side. Start with ball on chest, then straighten elbows to push ball up to the ceiling trying to keep elbows next to body hold 3sec, then slowly lower by keeping elbows close to your side. Keep shoulders away from ears Repeat 10 times.  Rest, then do 10 more, 2x/day   Overhead Arm Extension - Supine     Lay down.  Hold a ball/shoe box/pillow (shoulder width sized) on tops of legs with arms straight and hands flat on either side. Slowly move arms up/back in an arc with elbows straight and then slowly lower back down.  Only raise to shoulder height. Keep shoulders away from ears. Repeat 10 times.    Rest, then do 10 more, 2x/day  AROM: Wrist Extension   .  With  palm down, bend wrist up. Repeat __10__ times per set.  Do __2__ sessions per day.   AROM: Forearm Pronation / Supination   With  arm in handshake position, slowly rotate palm down until stretch is felt. Relax. Then rotate palm up until stretch is felt. Repeat _15___ times per set. Do _2___ sessions per day.     Continue to open close hand.  Work on doing this without tensing neck muscles.  Then grasp release empty bottle.  Hold bottle in right hand while trying to remove lid with left hand.

## 2018-10-25 ENCOUNTER — Ambulatory Visit: Payer: BLUE CROSS/BLUE SHIELD | Admitting: Physical Therapy

## 2018-10-25 ENCOUNTER — Encounter: Payer: BLUE CROSS/BLUE SHIELD | Admitting: Occupational Therapy

## 2018-10-26 ENCOUNTER — Ambulatory Visit: Payer: BLUE CROSS/BLUE SHIELD | Admitting: Speech Pathology

## 2018-10-26 ENCOUNTER — Encounter: Payer: Self-pay | Admitting: Speech Pathology

## 2018-10-26 ENCOUNTER — Encounter: Payer: Self-pay | Admitting: Internal Medicine

## 2018-10-26 ENCOUNTER — Ambulatory Visit: Payer: BLUE CROSS/BLUE SHIELD | Attending: Physician Assistant | Admitting: Physical Therapy

## 2018-10-26 ENCOUNTER — Ambulatory Visit: Payer: BLUE CROSS/BLUE SHIELD | Admitting: Occupational Therapy

## 2018-10-26 DIAGNOSIS — M79641 Pain in right hand: Secondary | ICD-10-CM | POA: Diagnosis present

## 2018-10-26 DIAGNOSIS — R2681 Unsteadiness on feet: Secondary | ICD-10-CM | POA: Insufficient documentation

## 2018-10-26 DIAGNOSIS — R29818 Other symptoms and signs involving the nervous system: Secondary | ICD-10-CM

## 2018-10-26 DIAGNOSIS — R41841 Cognitive communication deficit: Secondary | ICD-10-CM | POA: Diagnosis present

## 2018-10-26 DIAGNOSIS — M25531 Pain in right wrist: Secondary | ICD-10-CM | POA: Insufficient documentation

## 2018-10-26 DIAGNOSIS — R600 Localized edema: Secondary | ICD-10-CM | POA: Diagnosis present

## 2018-10-26 DIAGNOSIS — I69351 Hemiplegia and hemiparesis following cerebral infarction affecting right dominant side: Secondary | ICD-10-CM

## 2018-10-26 DIAGNOSIS — R293 Abnormal posture: Secondary | ICD-10-CM | POA: Insufficient documentation

## 2018-10-26 DIAGNOSIS — G8929 Other chronic pain: Secondary | ICD-10-CM | POA: Insufficient documentation

## 2018-10-26 DIAGNOSIS — I69318 Other symptoms and signs involving cognitive functions following cerebral infarction: Secondary | ICD-10-CM | POA: Insufficient documentation

## 2018-10-26 DIAGNOSIS — R1312 Dysphagia, oropharyngeal phase: Secondary | ICD-10-CM | POA: Diagnosis present

## 2018-10-26 DIAGNOSIS — M25511 Pain in right shoulder: Secondary | ICD-10-CM | POA: Diagnosis present

## 2018-10-26 DIAGNOSIS — R471 Dysarthria and anarthria: Secondary | ICD-10-CM | POA: Diagnosis present

## 2018-10-26 DIAGNOSIS — M6281 Muscle weakness (generalized): Secondary | ICD-10-CM | POA: Diagnosis present

## 2018-10-26 DIAGNOSIS — R2689 Other abnormalities of gait and mobility: Secondary | ICD-10-CM | POA: Diagnosis present

## 2018-10-26 DIAGNOSIS — R4701 Aphasia: Secondary | ICD-10-CM | POA: Diagnosis present

## 2018-10-26 NOTE — Therapy (Signed)
[facility] [address] [place], [place], [address] Phone: [phone]   Fax:  [phone]  Occupational Therapy Treatment  Patient Details  Name: Edward Glass MRN: [id number] Date of Birth: [birth year] Referring Provider (OT): Dr. Alysia Penna   Encounter Date: 10/26/2018  OT End of Session - 10/26/18 1406    Visit Number  5    Number of Visits  24    Date for OT Re-Evaluation  12/28/18    Authorization Type  [employer] - has 30 visits between OT and PT however if visits occur on same day counts as 1 visit. Discussion with pt re: use of remaining visits at vist 20    OT Start Time  1230    OT Stop Time  1320    OT Time Calculation (min)  50 min    Activity Tolerance  Patient tolerated treatment well    Behavior During Therapy  WFL for tasks assessed/performed       Past Medical History:  Diagnosis Date  . Atypical chest pain 08/10/2013  . Elevated aspartate aminotransferase level 06/16/2015  . Excessive salivation 06/16/2015  . H/O nutritional disorder 06/16/2015  . 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 Screening for prostate cancer 08/21/2014  . 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 HERNIA REPAIR  2006  . left tricep surgery    . LOOP RECORDER INSERTION N/A 09/04/2018   Procedure: LOOP RECORDER INSERTION;  Surgeon: Evans Lance, MD;  Location: [facility];  Service: Cardiovascular;  Laterality: N/A;  . TEE WITHOUT CARDIOVERSION N/A 09/04/2018   Procedure: TRANSESOPHAGEAL ECHOCARDIOGRAM (TEE);  Surgeon: Jerline Pain, MD;  Location: [facility];  Service: Cardiovascular;  Laterality: N/A;    There were no vitals filed for this visit.  Subjective Assessment - 10/26/18 1238    Subjective   When I wear the splint, the swelling in my hand increases    Pertinent History  L CVA on 09/14/18  Loop recorder, pt taking blood thinner     Patient Stated Goals  walk better and use my hand    Currently in Pain?  No/denies       Discussed making further adjustments to splint, however pt declined. Pt reports when he wears his splint, edema increases. Therapist however did not notice any pressure sores or areas of concern from splint. Pt may benefit from edema glove to help w/ edema, and possibly pre-fab splint to support wrist but w/ potentially less reported irritation.  Reviewed HEP issued on 10/24/18 however pt had max difficulty and compensations into shoulder hiking and use of accessory muscles to perform for shoulder ex's, therefore updated HEP to perform self ROM (See pt instructions for details). Even with self ROM, pt still uses accessory muscles (to a lesser degree) but some movement of RUE is crucial at this point. Pt also given table slides w/ assist from LUE, and low range bilateral shoulder flex/ext - see pt instructions for details. Pt able to do this well.  Pt appears aphasic and responds best to simple verbal cues along w/ demo and tactile cues.  Daughter present near end of session to review proper positioning during ex's.                     OT Education - 10/26/18 1320    Education Details  Corrected HEP    Person(s) Educated  Patient;Child(ren)  Methods  Explanation;Demonstration;Handout    Comprehension  Verbalized understanding;Returned demonstration;Verbal cues required;Need further instruction       OT Short Term Goals - 10/11/18 1659      OT SHORT TERM GOAL #1   Title  Pt and family will be mod I  with RUE positioning/care to address edema and pain in RUE - 11/16/2018    Status  On-going      OT SHORT TERM GOAL #2   Title  Pt and family will be mod I with splint wear and care    Status  On-going      OT SHORT TERM GOAL #3   Title  Pt and dtr will be mod I with HEP for RUE for ROM, addressing pain, functional use of RUE prn    Status  On-going      OT SHORT TERM GOAL #4   Title   Pt will report pain in R hand/wrist no greater than 5/10 during HEP.      Status  On-going      OT SHORT TERM GOAL #5   Title  Pt will demonstrate the ability for 55* of shoulder flexion as prep for functional low reach    Status  On-going      OT SHORT TERM GOAL #6   Title  Pt will be mod I with toilet and tub bench transfers    Status  On-going      OT SHORT TERM GOAL #7   Title  Pt will be mod I with bathing at shower level    Status  On-going        OT Long Term Goals - 10/11/18 1659      OT LONG TERM GOAL #1   Title  Pt and family will be mod I with upgraded home activities program - 12/28/2018    Status  On-going      OT LONG TERM GOAL #2   Title  Pt will demonstrate ability to grasp and hold light weight object in R hand with pain 2/10 or less    Status  On-going      OT LONG TERM GOAL #3   Title  Pt will demonstrate ability for bilateral low reach to obtain light weigh object     Status  On-going      OT LONG TERM GOAL #4   Title  Pt will be mod I with dressing    Status  On-going      OT LONG TERM GOAL #5   Title  Pt will be mod I with cutting food, AE prn    Status  On-going      OT LONG TERM GOAL #6   Title  Pt will demonstrate ability to use RUE as stabilizer at least 75% of the time in simple ADL tasks    Status  On-going      OT LONG TERM GOAL #7   Title  Pt will be mod I with simple snack/be prep at ambulatory level    Status  On-going      OT LONG TERM GOAL #8   Title  Pt will demonstrate improved postural alignment to decrease pain in RUE as well as decrease risk of falls    Status  On-going            Plan - 10/26/18 1408    Clinical Impression Statement  Pt progressing toward goals with decr pain and decr edema in R hand.  Occupational performance deficits (Please refer to evaluation for details):  ADL's;IADL's;Rest and Sleep;Work;Leisure;Social Participation    Body Structure / Function / Physical Skills  ADL;Balance;Decreased  knowledge of use of DME;Coordination;Edema;GMC;FMC;Flexibility;IADL;Mobility;Pain;ROM;Strength;Tone;UE functional use;Endurance    Cognitive Skills  --   INSIGHT/JUDGEMENT   Comorbidities impacting occupational performance description:  HTN, atypical chest pain, chronic R shoulder pain, OA of R shoulder, aphasia    OT Frequency  2x / week    OT Duration  12 weeks    OT Treatment/Interventions  Self-care/ADL training;Aquatic Therapy;Cryotherapy;Therapeutic exercise;Neuromuscular education;Energy conservation;DME and/or AE instruction;Functional Mobility Training;Cognitive remediation/compensation;Balance training;Splinting;Manual Therapy;Passive range of motion;Therapeutic activities;Patient/family education    Plan  Review updated HEP given on 10/26/18, continue NMR RUE/trunk and monitor edema Rt hand - pt may benefit from pre-fab wrist brace and edema glove (if current splint continues to increase edema)    Consulted and Agree with Plan of Care  Patient;Family member/caregiver    Family Member Consulted  dtr       Patient will benefit from skilled therapeutic intervention in order to improve the following deficits and impairments:  Body Structure / Function / Physical Skills, Cognitive Skills  Visit Diagnosis: Hemiplegia and hemiparesis following cerebral infarction affecting right dominant side ([place])  Other symptoms and signs involving the nervous system  Abnormal posture  Pain in right wrist    Problem List Patient Active Problem List   Diagnosis Date Noted  . Slow transit constipation   . Benign prostatic hyperplasia with urinary retention   . Right hemiparesis ([place])   . Chronic right shoulder pain   . Primary osteoarthritis of right shoulder   . Left basal ganglia embolic stroke ([place]) 94/80/1655  . CVA (cerebral vascular accident) ([facility]) 08/31/2018  . Elevated aspartate aminotransferase level 06/16/2015  . H/O nutritional disorder 06/16/2015  . Excessive salivation 06/16/2015   . 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 Heart palpitations 06/16/2015  . 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 Screening for prostate cancer 08/21/2014  . Atypical chest pain 08/10/2013  . 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 [place], [place], [address] Phone: [phone]   Fax:  [phone]  Name: Edward Glass MRN: [id number] Date of Birth: [birth year]

## 2018-10-26 NOTE — Patient Instructions (Addendum)
  It's OK to feel like you are shouting - that means you are giving effort and intention to your speech   Practice 10 abdominal breaths -   Straw phonation  2 minutes prior to PHoRTE and throughout the day and before any prolonged speaking  PHoRTE - twice a day  [email or web address]  10 Loud AH's as loud as you can and as long as you can  10 Pitch glides up, 10 pitch glides down   10 sentences in loud high pitch voice, like you are calling your neighbor over the phone  10 sentences in loud low authoritative pitch, like you are the boss  Use a good belly breath before each exercise- feel your abs contract in as you use your voice  Use a good abdominal breath to power your voice when talking  Repeat the following phrases LOUD and BIG - over enunciate (you will feel silly - that's OK)  Do 10 reps of squats  Cable pull downs - 3 sets of 10  Bench 10  Do 10-15 bicep curls  15 cable rolls  10 to 15 leg presses  15 sit up  Do 3 sets of these  I play Billiards at [place] you can do at home:   - [place]  - Chess/Checkers  - Crosswords (easy level)  - [place]  - jig saw

## 2018-10-26 NOTE — Progress Notes (Signed)
Received triage alert for tachy episode noted on LINQ from 10/25/18 at 1405, duration 6sec, median V rate 194bpm. As LINQ implanted for cryptogenic stroke, routed to Dr. Lovena Le for review.

## 2018-10-26 NOTE — Therapy (Signed)
[facility] [address] [place], [place], [address] Phone: [phone]   Fax:  [phone]  Speech Language Pathology Treatment  Patient Details  Name: Edward Glass MRN: [id number] Date of Birth: 1955-09-15 Referring Provider (SLP): Alysia Penna, MD   Encounter Date: 10/26/2018  End of Session - 10/26/18 1204    Visit Number  3    Number of Visits  21    Date for SLP Re-Evaluation  12/27/18    Authorization Type  30 visits ST (20 planned, save 10?)    Authorization - Visit Number  3    Authorization - Number of Visits  30    SLP Start Time  1104    SLP Stop Time   1145    SLP Time Calculation (min)  41 min    Activity Tolerance  Patient tolerated treatment well       Past Medical History:  Diagnosis Date  . Atypical chest pain 08/10/2013  . Elevated aspartate aminotransferase level 06/16/2015  . Excessive salivation 06/16/2015  . H/O nutritional disorder 06/16/2015  . 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 Screening for prostate cancer 08/21/2014  . 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 HERNIA REPAIR  2006  . left tricep surgery    . LOOP RECORDER INSERTION N/A 09/04/2018   Procedure: LOOP RECORDER INSERTION;  Surgeon: Evans Lance, MD;  Location: [facility];  Service: Cardiovascular;  Laterality: N/A;  . TEE WITHOUT CARDIOVERSION N/A 09/04/2018   Procedure: TRANSESOPHAGEAL ECHOCARDIOGRAM (TEE);  Surgeon: Jerline Pain, MD;  Location: [facility] ENDOSCOPY;  Service: Cardiovascular;  Laterality: N/A;    There were no vitals filed for this visit.  Subjective Assessment - 10/26/18 1108    Patient is accompained by:  Family member   daughter, [name]   Currently in Pain?  No/denies            ADULT SLP TREATMENT - 10/26/18 1142      General Information   Behavior/Cognition  Alert;Cooperative;Lethargic;Distractible      Treatment Provided   Treatment provided  Cognitive-Linquistic      Cognitive-Linquistic Treatment   Treatment focused on  Aphasia;Dysarthria    Skilled Treatment  Trained pt in [place] with Youtube video for home practice with mod A. Daughter not present in [place] today. Generated list of 10 personal training phrases (personally relevant to pt) for practing compensations for dysarthria with rare min A.  Simple divergent naming task with familiar categories to pt (personal training and billiards) pt anmed 10 items per category with occasional min questioning and phonemic cues.       Assessment / Recommendations / Plan   Plan  Continue with current plan of care      Progression Toward Goals   Progression toward goals  Progressing toward goals       SLP Education - 10/26/18 1159    Education Details  HEP for dysarthria; compensations for dysarthria; abdominal breathing    Person(s) Educated  Patient    Methods  Explanation;Demonstration;Verbal cues;Handout;Other (comment)   youtube video   Comprehension  Verbalized understanding;Verbal cues required;Need further instruction       SLP Short Term Goals - 10/26/18 1203      SLP SHORT TERM GOAL #1   Title  Pt will undergo naming assessment and/or cognitive linguistic assessment PRN    Baseline  BNT: 10-24-18    Time  3  Period  Weeks    Status  Partially Met      SLP SHORT TERM GOAL #2   Title  pt will demo improved conversational volume in sentence responses- average mid-upper 60s dB, over two sessions    Time  5    Period  Weeks    Status  On-going      SLP SHORT TERM GOAL #3   Title  pt will demo improved conversational volume in 5 mintues simple conversation with average mid-upper 60s dB, over three sessions    Time  5    Period  Weeks    Status  On-going      SLP SHORT TERM GOAL #4   Title  pt will demo abdominal breathing 60% of the time in 5 minutes conversation over three sessions    Time  5    Period  Weeks    Status   On-going      SLP SHORT TERM GOAL #5   Title  pt will name 3 non-physical deficits over three sessions    Time  5    Period  Weeks    Status  On-going      SLP SHORT TERM GOAL #6   Title  pt/family will provide 3 overt s/s aspiration PNA with modified independence over 2 sessions    Time  3    Period  Weeks    Status  On-going      SLP SHORT TERM GOAL #7   Title  pt will complete simple to mod complex naming tasks pertinent to pt's interests and life experience/s 85% over three sessions    Time  5    Period  Weeks    Status  New       SLP Long Term Goals - 10/26/18 1204      SLP LONG TERM GOAL #1   Title  pt will demo improved conversational volume in 8 minutes simple conversation average upper 60s-low 70s dB, over three sessions    Time  10    Period  Weeks   or 21 total visits, for all LTGs   Status  New      SLP LONG TERM GOAL #2   Title  pt will demo abdominal breathing 70% of the time in 5 minutes conversation over three sessions    Time  10    Period  Weeks    Status  New      SLP LONG TERM GOAL #3   Title  pt will complete a functional, simple organizational task for 25 minutes demonstrating adequate selelctive attention with modified indpendence, over three sessions    Time  10    Period  Weeks    Status  New      SLP LONG TERM GOAL #4   Title  pt will double check answers with speech therapy tasks (anticipatory awareness) with nonverbal cues over three sessions    Time  10    Status  New      SLP LONG TERM GOAL #5   Title  SLP will monitor pt to foster cont'd pulmonary health    Time  10    Period  Weeks    Status  New      SLP LONG TERM GOAL #6   Title  pt will demo functional word finding skills in 8 minutes simple conversation for WNL conversational fluidity over 3 sessions    Time  10    Period  Weeks  Status  New       Plan - 10/26/18 1201    Clinical Impression Statement  Moderate dysarthria persists - initiated training in HEP for  dysarthria - pt required mod A for volume in HEP and mod to max A for breath support. Compensations for dysarthria in structured taks with min A. Pt reports some improvement in word finding. Continue skilled ST to maximize intelligiblity and cognitive commuication skills.     Speech Therapy Frequency  2x / week    Duration  --   10 weeks or 21 visits   Treatment/Interventions  Oral motor exercises;Cueing hierarchy;Environmental controls;Compensatory techniques;Cognitive reorganization;Functional tasks;SLP instruction and feedback;Patient/family education;Internal/external aids;Aspiration precaution training;Pharyngeal strengthening exercises;Diet toleration management by SLP;Trials of upgraded texture/liquids    Potential to Achieve Goals  Good    Potential Considerations  Ability to learn/carryover information;Severity of impairments    SLP Home Exercise Plan  Loud Hey and Ah; PHoRTE    Consulted and Agree with Plan of Care  Patient       Patient will benefit from skilled therapeutic intervention in order to improve the following deficits and impairments:   Aphasia  Cognitive communication deficit  Dysarthria and anarthria  Dysphagia, oropharyngeal phase    Problem List Patient Active Problem List   Diagnosis Date Noted  . Slow transit constipation   . Benign prostatic hyperplasia with urinary retention   . Right hemiparesis ([place])   . Chronic right shoulder pain   . Primary osteoarthritis of right shoulder   . Left basal ganglia embolic stroke ([place]) 48/07/6551  . CVA (cerebral vascular accident) ([place]) 08/31/2018  . Elevated aspartate aminotransferase level 06/16/2015  . H/O nutritional disorder 06/16/2015  . Excessive salivation 06/16/2015  . 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 Heart palpitations 06/16/2015  . 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 Screening for prostate cancer 08/21/2014  . Atypical chest pain 08/10/2013  . Leg varices  02/07/2012    Maurene Hollin, Annye Rusk MS, CCC-SLP 10/26/2018, 12:04 PM  [facility] [address] [place], [place], [address] Phone: [phone]   Fax:  [phone]   Name: FAIZAAN FALLS MRN: [id number] Date of Birth: 1956/07/15

## 2018-10-26 NOTE — Therapy (Signed)
[facility] [address] [place], [place], [address] Phone: [phone]   Fax:  [phone]  Physical Therapy Treatment  Patient Details  Name: Edward Glass MRN: [id number] Date of Birth: 1956-06-25 Referring Provider (PT): Alysia Penna MD   Encounter Date: 10/26/2018  PT End of Session - 10/26/18 2042    Visit Number  5    Number of Visits  17    Date for PT Re-Evaluation  12/02/18    Authorization Type  BCBS; met $7500 deductible; VL: PT/OT 30, zero used; seen on same day =1 visit; SLP 30 visits    PT Start Time  1145    PT Stop Time  1230    PT Time Calculation (min)  45 min    Equipment Utilized During Treatment  Gait belt    Activity Tolerance  Patient tolerated treatment well    Behavior During Therapy  [facility] for tasks assessed/performed       Past Medical History:  Diagnosis Date  . Atypical chest pain 08/10/2013  . Elevated aspartate aminotransferase level 06/16/2015  . Excessive salivation 06/16/2015  . H/O nutritional disorder 06/16/2015  . 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 Screening for prostate cancer 08/21/2014  . 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 HERNIA REPAIR  2006  . left tricep surgery    . LOOP RECORDER INSERTION N/A 09/04/2018   Procedure: LOOP RECORDER INSERTION;  Surgeon: Evans Lance, MD;  Location: [facility];  Service: Cardiovascular;  Laterality: N/A;  . TEE WITHOUT CARDIOVERSION N/A 09/04/2018   Procedure: TRANSESOPHAGEAL ECHOCARDIOGRAM (TEE);  Surgeon: Jerline Pain, MD;  Location: [facility] ENDOSCOPY;  Service: Cardiovascular;  Laterality: N/A;    There were no vitals filed for this visit.  Subjective Assessment - 10/26/18 2025    Subjective  Pt reports no changes since previous PT session - states his Rt leg buckles (meaning it hyperextends)    Pertinent History  chronic rt shoulder pain;  had recent fall with rt wrist pain, xray negative for fracture and had steroid injection 09/27/18    Diagnostic tests  MRI-Multiple small foci of acute/early subacute infarctions present in the left MCA distribution concentrated in left basal ganglia; Small chronic infarcts in right parietal and occipital lobes    Patient Stated Goals  improve his walking and balance, be able to live on his own again in his 2 story house    Currently in Pain?  No/denies                       [facility] Adult PT Treatment/Exercise - 10/26/18 1203      Transfers   Transfers  Sit to Stand    Sit to Stand  4: Min assist    Stand to Sit  4: Min guard    Number of Reps  10 reps    Comments  Lt foot on balance bubble for incr. [name] weight bearing       Ambulation/Gait   Ambulation/Gait  Yes    Ambulation/Gait Assistance  4: Min guard    Ambulation Distance (Feet)  115 Feet    Assistive device  Small based quad cane    Gait Pattern  Step-to pattern;Step-through pattern;Decreased step length - left;Decreased stance time - right;Decreased stride length;Decreased hip/knee flexion - right;Decreased dorsiflexion - right;Right genu recurvatum;Trunk flexed;Poor foot clearance - right    Ambulation Surface  Level;Indoor      Knee/Hip Exercises: Stretches   Contractor reps;30 seconds   passively - pt seated    Other Knee/Hip Stretches  attempted Rt heel cord stretch in standing but pt stated he could not feel the stretch in [name] (AFO removed)       Knee/Hip Exercises: Standing   Forward Step Up  Right;1 set;10 reps;Hand Hold: 1   min to mod assist needed   Other Standing Knee Exercises  tap ups to 1st step (6") with LLE for increased [name] weight shift and for Rt knee control - mod assist given to prevent hyperextension       Knee/Hip Exercises: Seated   Hamstring Curl  Strengthening;Right;1 set;10 reps   with red theraband     Knee/Hip Exercises: Supine   Heel Slides  AAROM;Right;1 set;10  reps    Single Leg Bridge  AROM;Right;1 set;10 reps    Straight Leg Raises  AROM;Right;1 set;10 reps    Other Supine Knee/Hip Exercises  hip abduction in hooklying position with red theraband 10 reps        TherEx; Rt hip extension control exercise with Rt knee flexed - lifting leg off side of mat and back up onto mat with min assist   NeuroRe-ed:  Tap ups with LLE to 1st step with use of Lt hand rail with Rt knee blocked by PT's knee to prevent hyperextension In Rt SLS; pt performed 2 sets 10 reps of this exercise       PT Education - 10/26/18 2041    Education Details  added hip abduction in hooklying and Rt knee flexion in seated position with use of red theraband    Person(s) Educated  Patient    Methods  Explanation;Demonstration;Handout    Comprehension  Verbalized understanding;Returned demonstration       PT Short Term Goals - 10/04/18 0728      PT SHORT TERM GOAL #1   Title  Patient will be independent with HEP (Target all STGs 4 weeks 11/02/2018)      PT SHORT TERM GOAL #2   Title  Patient will improve gait velocity to 0.75 ft/sec demonstrating progress towards decr fall risk    Baseline  3/10 0.34 ft/sec      PT SHORT TERM GOAL #3   Title  Patient will improve 5x sit to stand to <18 seconds demonstrating progress towards decr fall risk    Baseline  3/9  24.62 sec      PT SHORT TERM GOAL #4   Title  Patient will ambulate modified independent over level surfaces with LRAD x 200 ft, including avoiding objects on his right       PT SHORT TERM GOAL #5   Title  Patient will ambulate on ramp and up/down curb with close-guarding and LRAD        PT Long Term Goals - 10/04/18 0735      PT LONG TERM GOAL #1   Title  Patient will be independent with updated HEP and verbalize plan for continued community-based exercise plan (Target all LTGs 8 weeks, 12/02/2018)      PT LONG TERM GOAL #2   Title  Patient will improve gait velocity to 1.30 ft/sec demonstrating progress  towards decr fall risk      PT LONG TERM GOAL #3   Title  Patient will improve 5x sit to stand to <12 seconds demonstrating decr fall risk      PT LONG TERM GOAL #  4   Title  Patient will ambulate over outdoor paved surfaces modified independent with LRAD, including up/down curb.       PT LONG TERM GOAL #5   Title  Patient will ambulate >300 ft over level indoor surface in <= 6 minutes (6MWT)      Additional Long Term Goals   Additional Long Term Goals  Yes      PT LONG TERM GOAL #6   Title  Patient will go up/down 12 steps with single rail modified independent             Plan - 10/26/18 2043    Clinical Impression Statement  Pt continues to be very fearful/anxious of Rt knee hyperextending during stance phase of gait, resulting in decreased weight shift onto [name], increased Rt knee flexion at times and decreased step length of [name].  Pt has extensor tone in [name] and has much difficulty with activation of Rt hamstrings.                                                                                                                                    Personal Factors and Comorbidities  Comorbidity 1    Comorbidities  chronic rt shoulder pain    Examination-Activity Limitations  Bathing;Bed Mobility;Caring for Others;Carry;Dressing;Locomotion Level;Lift;Reach Overhead;Self Feeding;Stairs    Examination-Participation Restrictions  Community Activity;Driving    Stability/Clinical Decision Making  Stable/Uncomplicated    Rehab Potential  Good    PT Frequency  2x / week    PT Duration  8 weeks    PT Treatment/Interventions  ADLs/Self Care Home Management;Aquatic Therapy;Electrical Stimulation;DME Instruction;Gait training;Stair training;Functional mobility training;Therapeutic activities;Therapeutic exercise;Balance training;Orthotic Fit/Training;Patient/family education;Neuromuscular re-education;Wheelchair mobility training;Passive range of motion;Visual/perceptual remediation/compensation     PT Next Visit Plan  Bioness - add hamstring upper cuff to [name] as able.  Consider trying tall kneel/half kneel positions for hamstring/glut activation and improved [name] weightshifting; Continue NMR and pre-gait training with focus on weight shift to R, hamstring activation, terminal hip extension.  Exercises with Bioness.  Standing balance.    Consulted and Agree with Plan of Care  Patient       Patient will benefit from skilled therapeutic intervention in order to improve the following deficits and impairments:  Abnormal gait, Decreased activity tolerance, Decreased balance, Decreased endurance, Decreased coordination, Decreased cognition, Decreased knowledge of use of DME, Decreased mobility, Decreased safety awareness, Difficulty walking, Decreased strength, Increased edema, Impaired perceived functional ability, Impaired UE functional use  Visit Diagnosis: Hemiplegia and hemiparesis following cerebral infarction affecting right dominant side (HCC)  Other abnormalities of gait and mobility  Muscle weakness (generalized)     Problem List Patient Active Problem List   Diagnosis Date Noted  . Slow transit constipation   . Benign prostatic hyperplasia with urinary retention   . Right hemiparesis ([place])   . Chronic right shoulder pain   . Primary osteoarthritis of right shoulder   .  Left basal ganglia embolic stroke ([place]) 50/51/0712  . CVA (cerebral vascular accident) ([place]) 08/31/2018  . Elevated aspartate aminotransferase level 06/16/2015  . H/O nutritional disorder 06/16/2015  . Excessive salivation 06/16/2015  . 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 Heart palpitations 06/16/2015  . 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 Screening for prostate cancer 08/21/2014  . Atypical chest pain 08/10/2013  . 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

## 2018-10-26 NOTE — Progress Notes (Unsigned)
If symptomatic, I need to see in office next week.. If not, watchful waiting and followup in 2-3 months. GT

## 2018-10-26 NOTE — Patient Instructions (Signed)
Incline: Chest Press (Active)    Lie on back, upper trunk on incline and arms fully extended. ASSIST WITH OTHER HAND INSTEAD OF DOWEL (Rt thumb side up). Lower to chest and press to arm's length. Complete _1_ sets of _10__ repetitions. Perform _2__ sessions per day.  Copyright  VHI. All rights reserved.    Flexion (Assistive)    LAYING DOWN, Clasp Rt wrist w/ Lt hand, thumb side up, keeping elbows as straight as possible. Go to eye level Repeat __10__ times. Do __2__ sessions per day.  Flexion (Passive)    Sitting upright, slide both arms forward along table, bending from the waist until a stretch is felt. Hold ____ seconds. Repeat _10___ times. Do _2___ sessions per day.   Hold beach ball (let some air out) or shoe box with both hands sitting up, then lower to floor and back up to lap. May progress to placing on chair in front if able. Repeat 10 times, 2x/day  Close and open hand fully and slowly x 10 reps, several times a day.

## 2018-10-26 NOTE — Patient Instructions (Signed)
Knee Flexion: Resisted (Sitting)    Sit with band under left foot and looped around ankle of supported leg. Pull unsupported leg back. Repeat _10_ times per set. Do __3__ sets per session. Do _1-2___ sessions per day.  http://orth.exer.us/694     Strengthening: Hip Abductor - Resisted    With band looped around both legs above knees, push thighs apart. Repeat __10__ times per set. Do __3__ sets per session. Do _1-2___ sessions per day.  HOLD LEFT LEG STILL - DO NOT MOVE THIS LEG - only move right leg out to side Try to hold right leg out to side for 3 secs.  http://orth.exer.us/688   .

## 2018-10-27 ENCOUNTER — Ambulatory Visit: Payer: BLUE CROSS/BLUE SHIELD | Admitting: Physical Therapy

## 2018-10-27 ENCOUNTER — Encounter: Payer: BLUE CROSS/BLUE SHIELD | Admitting: Occupational Therapy

## 2018-10-30 ENCOUNTER — Ambulatory Visit: Payer: BLUE CROSS/BLUE SHIELD | Admitting: Physical Therapy

## 2018-10-30 ENCOUNTER — Encounter: Payer: BLUE CROSS/BLUE SHIELD | Admitting: Occupational Therapy

## 2018-10-30 ENCOUNTER — Telehealth: Payer: Self-pay | Admitting: Adult Health

## 2018-10-30 NOTE — Telephone Encounter (Signed)
Due to current COVID 19 pandemic, our office is severely reducing in office visits for at least the next 2 weeks, in order to minimize the risk to our patients and healthcare providers. Pt understands that although there may be some limitations with this type of visit, we will take all precautions to reduce any security or privacy concerns.  Pt understands that this will be treated like an in office visit and we will file with pt's insurance, and there may be a patient responsible charge related to this service. Pt's email is [email or web address]. Pt understands that the cisco webex software must be downloaded and operational on the device pt plans to use for the visit.

## 2018-10-30 NOTE — Progress Notes (Signed)
Spoke with patient regarding symptoms during tachy episode. Pt reports he felt his heart racing, denies dizziness, ShOB, or chest discomfort. Pt has a smart phone and would be willing to do an E-visit with Dr. Lovena Le. Routed to [place], Therapist, sports, to schedule.

## 2018-10-31 ENCOUNTER — Ambulatory Visit: Payer: BLUE CROSS/BLUE SHIELD | Admitting: Physical Therapy

## 2018-10-31 ENCOUNTER — Encounter: Payer: Self-pay | Admitting: Physical Therapy

## 2018-10-31 ENCOUNTER — Other Ambulatory Visit: Payer: Self-pay

## 2018-10-31 ENCOUNTER — Encounter: Payer: Self-pay | Admitting: Speech Pathology

## 2018-10-31 ENCOUNTER — Ambulatory Visit: Payer: BLUE CROSS/BLUE SHIELD | Admitting: Occupational Therapy

## 2018-10-31 ENCOUNTER — Ambulatory Visit: Payer: BLUE CROSS/BLUE SHIELD | Admitting: Speech Pathology

## 2018-10-31 ENCOUNTER — Encounter: Payer: Self-pay | Admitting: Occupational Therapy

## 2018-10-31 DIAGNOSIS — R29818 Other symptoms and signs involving the nervous system: Secondary | ICD-10-CM

## 2018-10-31 DIAGNOSIS — I69351 Hemiplegia and hemiparesis following cerebral infarction affecting right dominant side: Secondary | ICD-10-CM

## 2018-10-31 DIAGNOSIS — R600 Localized edema: Secondary | ICD-10-CM

## 2018-10-31 DIAGNOSIS — M6281 Muscle weakness (generalized): Secondary | ICD-10-CM

## 2018-10-31 DIAGNOSIS — R293 Abnormal posture: Secondary | ICD-10-CM

## 2018-10-31 DIAGNOSIS — M25511 Pain in right shoulder: Secondary | ICD-10-CM

## 2018-10-31 DIAGNOSIS — R4701 Aphasia: Secondary | ICD-10-CM

## 2018-10-31 DIAGNOSIS — R2689 Other abnormalities of gait and mobility: Secondary | ICD-10-CM

## 2018-10-31 DIAGNOSIS — R41841 Cognitive communication deficit: Secondary | ICD-10-CM

## 2018-10-31 DIAGNOSIS — R471 Dysarthria and anarthria: Secondary | ICD-10-CM

## 2018-10-31 DIAGNOSIS — R2681 Unsteadiness on feet: Secondary | ICD-10-CM

## 2018-10-31 DIAGNOSIS — G8929 Other chronic pain: Secondary | ICD-10-CM

## 2018-10-31 DIAGNOSIS — M25531 Pain in right wrist: Secondary | ICD-10-CM

## 2018-10-31 DIAGNOSIS — I69318 Other symptoms and signs involving cognitive functions following cerebral infarction: Secondary | ICD-10-CM

## 2018-10-31 DIAGNOSIS — M79641 Pain in right hand: Secondary | ICD-10-CM

## 2018-10-31 NOTE — Therapy (Signed)
[facility] [address] [place], [place], [address] Phone: [phone]   Fax:  [phone]  Occupational Therapy Treatment  Patient Details  Name: Edward Glass MRN: [id number] Date of Birth: [birth year] Referring Provider (OT): Dr. Alysia Penna   Encounter Date: 10/31/2018  OT End of Session - 10/31/18 1548    Visit Number  6    Number of Visits  24    Date for OT Re-Evaluation  12/28/18    Authorization Type  BCBS - has 30 visits between OT and PT however if visits occur on same day counts as 1 visit. Discussion with pt re: use of remaining visits at vist 20    OT Start Time  1321    OT Stop Time  1402    OT Time Calculation (min)  41 min    Activity Tolerance  Patient tolerated treatment well    Behavior During Therapy  [facility] for tasks assessed/performed       Past Medical History:  Diagnosis Date  . Atypical chest pain 08/10/2013  . Elevated aspartate aminotransferase level 06/16/2015  . Excessive salivation 06/16/2015  . H/O nutritional disorder 06/16/2015  . 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 Screening for prostate cancer 08/21/2014  . 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 HERNIA REPAIR  2006  . left tricep surgery    . LOOP RECORDER INSERTION N/A 09/04/2018   Procedure: LOOP RECORDER INSERTION;  Surgeon: Evans Lance, MD;  Location: [facility];  Service: Cardiovascular;  Laterality: N/A;  . TEE WITHOUT CARDIOVERSION N/A 09/04/2018   Procedure: TRANSESOPHAGEAL ECHOCARDIOGRAM (TEE);  Surgeon: Jerline Pain, MD;  Location: [facility] ENDOSCOPY;  Service: Cardiovascular;  Laterality: N/A;    There were no vitals filed for this visit.  Subjective Assessment - 10/31/18 1324    Subjective   Pt reports no rubbing/pain from splint but continued edema.    Pertinent History  L CVA on 09/14/18  Loop recorder, pt taking blood thinner     Patient Stated Goals  walk better and use my hand    Currently in Pain?  Yes    Pain Score  3     Pain Location  Wrist    Pain Orientation  Right    Pain Descriptors / Indicators  Aching    Pain Type  Acute pain    Pain Frequency  Intermittent    Aggravating Factors   with exercise    Pain Relieving Factors  rest         Reviewed updates to HEP and pt returned demo with min cueing for shoulder/neck compensation, particularly for supine exercises.  Gentle wrist mobs and PROM.  Followed by AROM wrist ext x10.    Pt fitted for compression glove for edema in R hand and pre-fab wrist cock-up splint as edema noted to be incr in fingers/dorsal hand distally due to hand strap.  Pt educated in splint/glove wear/care and verbalized understanding.     OT Education - 10/31/18 1545    Education Details  Addition to HEP--see pt instructions (AAROM elbow ext/shoulder flex with cane); Compression glove and pre-fab wrist splint wear    Person(s) Educated  Patient    Methods  Explanation;Demonstration;Handout    Comprehension  Verbalized understanding;Returned demonstration;Verbal cues required       OT Short Term Goals - 10/11/18 1659      OT SHORT TERM GOAL #1  Title  Pt and family will be mod I  with RUE positioning/care to address edema and pain in RUE - 11/16/2018    Status  On-going      OT SHORT TERM GOAL #2   Title  Pt and family will be mod I with splint wear and care    Status  On-going      OT SHORT TERM GOAL #3   Title  Pt and dtr will be mod I with HEP for RUE for ROM, addressing pain, functional use of RUE prn    Status  On-going      OT SHORT TERM GOAL #4   Title  Pt will report pain in R hand/wrist no greater than 5/10 during HEP.      Status  On-going      OT SHORT TERM GOAL #5   Title  Pt will demonstrate the ability for 55* of shoulder flexion as prep for functional low reach    Status  On-going      OT SHORT TERM GOAL #6   Title  Pt will be mod I with  toilet and tub bench transfers    Status  On-going      OT SHORT TERM GOAL #7   Title  Pt will be mod I with bathing at shower level    Status  On-going        OT Long Term Goals - 10/11/18 1659      OT LONG TERM GOAL #1   Title  Pt and family will be mod I with upgraded home activities program - 12/28/2018    Status  On-going      OT LONG TERM GOAL #2   Title  Pt will demonstrate ability to grasp and hold light weight object in R hand with pain 2/10 or less    Status  On-going      OT LONG TERM GOAL #3   Title  Pt will demonstrate ability for bilateral low reach to obtain light weigh object     Status  On-going      OT LONG TERM GOAL #4   Title  Pt will be mod I with dressing    Status  On-going      OT LONG TERM GOAL #5   Title  Pt will be mod I with cutting food, AE prn    Status  On-going      OT LONG TERM GOAL #6   Title  Pt will demonstrate ability to use RUE as stabilizer at least 75% of the time in simple ADL tasks    Status  On-going      OT LONG TERM GOAL #7   Title  Pt will be mod I with simple snack/be prep at ambulatory level    Status  On-going      OT LONG TERM GOAL #8   Title  Pt will demonstrate improved postural alignment to decrease pain in RUE as well as decrease risk of falls    Status  On-going            Plan - 10/31/18 1549    Clinical Impression Statement  Pt progressing toward goals with decr pain and HEP performance.  Pt continues to need cueing to avoid compensation patterns at neck.     Occupational performance deficits (Please refer to evaluation for details):  ADL's;IADL's;Rest and Sleep;Work;Leisure;Social Participation    Body Structure / Function / Physical Skills  ADL;Balance;Decreased knowledge of use of DME;Coordination;Edema;GMC;FMC;Flexibility;IADL;Mobility;Pain;ROM;Strength;Tone;UE functional  use;Endurance    Cognitive Skills  --   INSIGHT/JUDGEMENT   Comorbidities impacting occupational performance description:  HTN,  atypical chest pain, chronic R shoulder pain, OA of R shoulder, aphasia    OT Frequency  2x / week    OT Duration  12 weeks    OT Treatment/Interventions  Self-care/ADL training;Aquatic Therapy;Cryotherapy;Therapeutic exercise;Neuromuscular education;Energy conservation;DME and/or AE instruction;Functional Mobility Training;Cognitive remediation/compensation;Balance training;Splinting;Manual Therapy;Passive range of motion;Therapeutic activities;Patient/family education    Plan  continue NMR RUE/trunk and monitor edema Rt hand     Consulted and Agree with Plan of Care  Patient;Family member/caregiver    Family Member Consulted  dtr       Patient will benefit from skilled therapeutic intervention in order to improve the following deficits and impairments:  Body Structure / Function / Physical Skills, Cognitive Skills  Visit Diagnosis: Hemiplegia and hemiparesis following cerebral infarction affecting right dominant side (HCC)  Other symptoms and signs involving the nervous system  Abnormal posture  Pain in right wrist  Other abnormalities of gait and mobility  Muscle weakness (generalized)  Unsteadiness on feet  Acute pain of right shoulder  Pain in right hand  Localized edema  Other symptoms and signs involving cognitive functions following cerebral infarction  Chronic right shoulder pain    Problem List Patient Active Problem List   Diagnosis Date Noted  . Slow transit constipation   . Benign prostatic hyperplasia with urinary retention   . Right hemiparesis ([place])   . Chronic right shoulder pain   . Primary osteoarthritis of right shoulder   . Left basal ganglia embolic stroke ([place]) 62/09/5595  . CVA (cerebral vascular accident) ([place]) 08/31/2018  . Elevated aspartate aminotransferase level 06/16/2015  . H/O nutritional disorder 06/16/2015  . Excessive salivation 06/16/2015  . 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 Heart palpitations 06/16/2015  . 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 Screening for prostate cancer 08/21/2014  . Atypical chest pain 08/10/2013  . 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 [place], [place]  [address] [phone] phone [phone] 10/31/18 4:00 PM

## 2018-10-31 NOTE — Patient Instructions (Signed)
   Instead of "AH" change it to [facility]  Twice a day do the speech exercises - Big breath, get loud  Read aloud twice a day - big breath - effort of 6/10  Feel too loud  Signs of Aspiration Pneumonia   . Chest pain/tightness . Fever (can be low grade) . Cough  o With foul-smelling phlegm (sputum) o With sputum containing pus or blood o With greenish sputum . Fatigue  . Shortness of breath  . Wheezing   **IF YOU HAVE THESE SIGNS, CONTACT YOUR DOCTOR OR GO TO THE EMERGENCY DEPARTMENT OR URGENT CARE AS SOON AS POSSIBLE**

## 2018-10-31 NOTE — Patient Instructions (Addendum)
SELF ASSISTED WITH OBJECT: Shoulder Flexion / Elbow Extension (Crutch)    Place one hand on cane. Move arm forward, straighten elbow. 10 reps per set, 2x/day        Try to wear black wrist splint for 4hours, 2x/day.  Remove if causing pain/rubbing.  Wear compression glove during the day.  Remove if having pain, washing hands, or if it is interfering with using right hand.

## 2018-11-01 ENCOUNTER — Ambulatory Visit: Payer: BLUE CROSS/BLUE SHIELD | Admitting: Physical Therapy

## 2018-11-01 NOTE — Therapy (Signed)
[facility] [address] [place], [place], [address] Phone: [phone]   Fax:  [phone]  Physical Therapy Treatment  Patient Details  Name: Edward Glass MRN: [id number] Date of Birth: 15-Jul-1956 Referring Provider (PT): Alysia Penna MD   Encounter Date: 10/31/2018  PT End of Session - 11/01/18 0924    Visit Number  6    Number of Visits  17    Date for PT Re-Evaluation  12/02/18    Authorization Type  BC[employer]; met $7500 deductible; VL: PT/OT 30, zero used; seen on same day =1 visit; SLP 30 visits    Authorization - Visit Number  6    Authorization - Number of Visits  30    PT Start Time  1150    PT Stop Time  1233    PT Time Calculation (min)  43 min    Equipment Utilized During Treatment  Gait belt    Activity Tolerance  Patient tolerated treatment well    Behavior During Therapy  [facility] for tasks assessed/performed       Past Medical History:  Diagnosis Date  . Atypical chest pain 08/10/2013  . Elevated aspartate aminotransferase level 06/16/2015  . Excessive salivation 06/16/2015  . H/O nutritional disorder 06/16/2015  . 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 Screening for prostate cancer 08/21/2014  . 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 HERNIA REPAIR  2006  . left tricep surgery    . LOOP RECORDER INSERTION N/A 09/04/2018   Procedure: LOOP RECORDER INSERTION;  Surgeon: Evans Lance, MD;  Location: [facility];  Service: Cardiovascular;  Laterality: N/A;  . TEE WITHOUT CARDIOVERSION N/A 09/04/2018   Procedure: TRANSESOPHAGEAL ECHOCARDIOGRAM (TEE);  Surgeon: Jerline Pain, MD;  Location: [facility] ENDOSCOPY;  Service: Cardiovascular;  Laterality: N/A;    There were no vitals filed for this visit.  Subjective Assessment - 10/31/18 1155    Subjective  No changes since last visit.  Fearful of R knee over extending.    Pertinent History  chronic rt shoulder pain; had recent fall with rt wrist pain, xray negative for fracture and had steroid injection 09/27/18    Diagnostic tests  MRI-Multiple small foci of acute/early subacute infarctions present in the left MCA distribution concentrated in left basal ganglia; Small chronic infarcts in right parietal and occipital lobes    Patient Stated Goals  improve his walking and balance, be able to live on his own again in his 2 story house    Currently in Pain?  Yes    Pain Score  3     Pain Location  Wrist    Pain Orientation  Right    Pain Descriptors / Indicators  Tightness    Pain Type  Acute pain    Pain Onset  More than a month ago    Aggravating Factors   moving with exercise    Pain Relieving Factors  rest, change of position, using splint                       [facility] Adult PT Treatment/Exercise - 11/01/18 0001      Ambulation/Gait   Ambulation/Gait  Yes    Ambulation/Gait Assistance  4: Min guard    Ambulation/Gait Assistance Details  Cues provided with gait for increased left step length, to allow for improved [name] push off and foot clearance.  Tactile cues  given at posterior knee last 40 ft of gait to avoid hyperextension at R knee.    Ambulation Distance (Feet)  115 Feet   60 ft x 2   Assistive device  Small based quad cane    Gait Pattern  Step-to pattern;Step-through pattern;Decreased step length - left;Decreased stance time - right;Decreased stride length;Decreased hip/knee flexion - right;Decreased dorsiflexion - right;Right genu recurvatum;Trunk flexed;Poor foot clearance - right    Ambulation Surface  Level;Indoor    Pre-Gait Activities  Checked to see if pt has heel wedge already in shoe, and he does have a heel wedge in addition to his AFO    Gait Comments  Attempted to work on sequencing cane and [name] advancing at the same time (trying to progress from cane, [name] step, LLE step pattern), but pt unable to sequence this progression  today.      Knee/Hip Exercises: Stretches   Gastroc Stretch  Right;30 seconds;3 reps   Passively-patient seated     Knee/Hip Exercises: Seated   Other Seated Knee/Hip Exercises  Seated heel digs, in place at edge of mat, x 5 reps, assistance to keep foot in position and avoid extension.      Knee/Hip Exercises: Supine   Single Leg Bridge  AROM;Right;1 set;10 reps   PT holds R foot to prevent sliding into extension   Other Supine Knee/Hip Exercises  Hooklying marching 2 sets x 10 reps, hooklying heel digs 2 sets x 5 reps, for hamstring, dorsiflexior activation    Other Supine Knee/Hip Exercises  R knee flexed with hip extension off edge of mat, 2 sets x 10 reps, with quick stretch provided prior to hip flexion to come back onto mat, with facilitation to engage dorsiflexors      Knee/Hip Exercises: Sidelying   Other Sidelying Knee/Hip Exercises  Using powder board in L sidelying, A/AROM for R hip extension with knee extended x 10 reps, R hip extension with knee flexed x 10 reps; then A/AROM for R hamstrings with knee flexion and extension.  PT provides quick stretch into knee extension, then pt begins into knee flexion range, with PT tapping at hamstrings, needing assistance to complete full range               PT Short Term Goals - 10/04/18 1740      PT SHORT TERM GOAL #1   Title  Patient will be independent with HEP ([facility] all STGs 4 weeks 11/02/2018)      PT SHORT TERM GOAL #2   Title  Patient will improve gait velocity to 0.75 ft/sec demonstrating progress towards decr fall risk    Baseline  3/10 0.34 ft/sec      PT SHORT TERM GOAL #3   Title  Patient will improve 5x sit to stand to <18 seconds demonstrating progress towards decr fall risk    Baseline  3/9  24.62 sec      PT SHORT TERM GOAL #4   Title  Patient will ambulate modified independent over level surfaces with LRAD x 200 ft, including avoiding objects on his right       PT SHORT TERM GOAL #5   Title  Patient  will ambulate on ramp and up/down curb with close-guarding and LRAD        PT Long Term Goals - 10/04/18 0735      PT LONG TERM GOAL #1   Title  Patient will be independent with updated HEP and verbalize plan for continued community-based exercise plan ([facility]  all LTGs 8 weeks, 12/02/2018)      PT LONG TERM GOAL #2   Title  Patient will improve gait velocity to 1.30 ft/sec demonstrating progress towards decr fall risk      PT LONG TERM GOAL #3   Title  Patient will improve 5x sit to stand to <12 seconds demonstrating decr fall risk      PT LONG TERM GOAL #4   Title  Patient will ambulate over outdoor paved surfaces modified independent with LRAD, including up/down curb.       PT LONG TERM GOAL #5   Title  Patient will ambulate >300 ft over level indoor surface in <= 6 minutes (6MWT)      Additional Long Term Goals   Additional Long Term Goals  Yes      PT LONG TERM GOAL #6   Title  Patient will go up/down 12 steps with single rail modified independent             Plan - 11/01/18 0924    Clinical Impression Statement  With exercises in gravity eliminated position on powder board, pt is able to activate R hamstrings for partial ROM.  With gait training, pt continues to have [name] stance with knee flexed, even with cues, decreased [name] foot clearance and step length.  Pt will continue to benefit from further skilled PT to address strengthening, balance, and gait training for progression towards independence.    Personal Factors and Comorbidities  Comorbidity 1    Comorbidities  chronic rt shoulder pain    Examination-Activity Limitations  Bathing;Bed Mobility;Caring for Others;Carry;Dressing;Locomotion Level;Lift;Reach Overhead;Self Feeding;Stairs    Examination-Participation Restrictions  Community Activity;Driving    Stability/Clinical Decision Making  Stable/Uncomplicated    Rehab Potential  Good    PT Frequency  2x / week    PT Duration  8 weeks    PT Treatment/Interventions   ADLs/Self Care Home Management;Aquatic Therapy;Electrical Stimulation;DME Instruction;Gait training;Stair training;Functional mobility training;Therapeutic activities;Therapeutic exercise;Balance training;Orthotic Fit/Training;Patient/family education;Neuromuscular re-education;Wheelchair mobility training;Passive range of motion;Visual/perceptual remediation/compensation    PT Next Visit Plan  Bioness - add hamstring upper cuff to [name] as able.  Consider trying tall kneel/half kneel positions for hamstring/glut activation and improved [name] weightshifting; Continue NMR and pre-gait training with focus on weight shift to R, hamstring activation, terminal hip extension.  Exercises with Bioness.  Standing balance.   Review HEP additions from 10/26/2018, check STGs   Consulted and Agree with Plan of Care  Patient       Patient will benefit from skilled therapeutic intervention in order to improve the following deficits and impairments:  Abnormal gait, Decreased activity tolerance, Decreased balance, Decreased endurance, Decreased coordination, Decreased cognition, Decreased knowledge of use of DME, Decreased mobility, Decreased safety awareness, Difficulty walking, Decreased strength, Increased edema, Impaired perceived functional ability, Impaired UE functional use  Visit Diagnosis: Muscle weakness (generalized)  Other abnormalities of gait and mobility     Problem List Patient Active Problem List   Diagnosis Date Noted  . Slow transit constipation   . Benign prostatic hyperplasia with urinary retention   . Right hemiparesis ([place])   . Chronic right shoulder pain   . Primary osteoarthritis of right shoulder   . Left basal ganglia embolic stroke ([place]) 02/40/9735  . CVA (cerebral vascular accident) ([place]) 08/31/2018  . Elevated aspartate aminotransferase level 06/16/2015  . H/O nutritional disorder 06/16/2015  . Excessive salivation 06/16/2015  . 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 Heart  palpitations 06/16/2015  . 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 Screening for prostate cancer 08/21/2014  . Atypical chest pain 08/10/2013  . 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    Arrionna Serena W. 11/01/2018, 9:28 AM  Frazier Butt., PT   [facility] [address] [place], [place], [address] Phone: [phone]   Fax:  [phone]  Name: Edward Glass MRN: [id number] Date of Birth: 1956-06-23

## 2018-11-02 ENCOUNTER — Telehealth: Payer: Self-pay

## 2018-11-02 NOTE — Therapy (Signed)
[facility] [address] [place], [place], [address] Phone: [phone]   Fax:  [phone]  Speech Language Pathology Treatment  Patient Details  Name: Edward Glass MRN: [id number] Date of Birth: 1955/09/06 Referring Provider (SLP): Alysia Penna, MD   Encounter Date: 10/31/2018  End of Session - 11/02/18 1024    Visit Number  4    Number of Visits  21    Date for SLP Re-Evaluation  12/27/18    Authorization Type  30 visits ST (20 planned, save 10?)    Authorization - Visit Number  4    Authorization - Number of Visits  30    SLP Start Time  0254    SLP Stop Time   1315    SLP Time Calculation (min)  40 min    Activity Tolerance  Patient tolerated treatment well       Past Medical History:  Diagnosis Date  . Atypical chest pain 08/10/2013  . Elevated aspartate aminotransferase level 06/16/2015  . Excessive salivation 06/16/2015  . H/O nutritional disorder 06/16/2015  . 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 Screening for prostate cancer 08/21/2014  . 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 HERNIA REPAIR  2006  . left tricep surgery    . LOOP RECORDER INSERTION N/A 09/04/2018   Procedure: LOOP RECORDER INSERTION;  Surgeon: Evans Lance, MD;  Location: [facility];  Service: Cardiovascular;  Laterality: N/A;  . TEE WITHOUT CARDIOVERSION N/A 09/04/2018   Procedure: TRANSESOPHAGEAL ECHOCARDIOGRAM (TEE);  Surgeon: Jerline Pain, MD;  Location: [facility] ENDOSCOPY;  Service: Cardiovascular;  Laterality: N/A;    There were no vitals filed for this visit.           SLP Education - 11/02/18 1021    Education Details  HEP for dysarthria; breath support for speech; s/s of aspiration pna    Person(s) Educated  Patient    Methods  Explanation;Demonstration;Verbal cues;Handout    Comprehension  Verbalized understanding;Returned  demonstration;Verbal cues required;Need further instruction       SLP Short Term Goals - 11/02/18 1023      SLP SHORT TERM GOAL #1   Title  Pt will undergo naming assessment and/or cognitive linguistic assessment PRN    Baseline  BNT: 10-24-18    Time  2    Period  Weeks    Status  Partially Met      SLP SHORT TERM GOAL #2   Title  pt will demo improved conversational volume in sentence responses- average mid-upper 60s dB, over two sessions    Time  4    Period  Weeks    Status  On-going      SLP SHORT TERM GOAL #3   Title  pt will demo improved conversational volume in 5 mintues simple conversation with average mid-upper 60s dB, over three sessions    Time  4    Period  Weeks    Status  On-going      SLP SHORT TERM GOAL #4   Title  pt will demo abdominal breathing 60% of the time in 5 minutes conversation over three sessions    Time  4    Period  Weeks    Status  On-going      SLP SHORT TERM GOAL #5   Title  pt will name 3 non-physical deficits over three sessions    Time  4    Period  Weeks    Status  On-going      SLP SHORT TERM GOAL #6   Title  pt/family will provide 3 overt s/s aspiration PNA with modified independence over 2 sessions    Time  3    Period  Weeks    Status  On-going      SLP SHORT TERM GOAL #7   Title  pt will complete simple to mod complex naming tasks pertinent to pt's interests and life experience/s 85% over three sessions    Time  5    Period  Weeks    Status  New       SLP Long Term Goals - 11/02/18 [place] #1   Title  pt will demo improved conversational volume in 8 minutes simple conversation average upper 60s-low 70s dB, over three sessions    Time  9    Period  Weeks   or 21 total visits, for all LTGs   Status  On-going      SLP LONG TERM GOAL #2   Title  pt will demo abdominal breathing 70% of the time in 5 minutes conversation over three sessions    Time  9    Period  Weeks    Status  On-going       SLP LONG TERM GOAL #3   Title  pt will complete a functional, simple organizational task for 25 minutes demonstrating adequate selelctive attention with modified indpendence, over three sessions    Time  9    Period  Weeks    Status  On-going      SLP LONG TERM GOAL #4   Title  pt will double check answers with speech therapy tasks (anticipatory awareness) with nonverbal cues over three sessions    Time  9    Status  On-going      SLP LONG TERM GOAL #5   Title  SLP will monitor pt to foster cont'd pulmonary health    Time  9    Period  Weeks    Status  On-going      SLP LONG TERM GOAL #6   Title  pt will demo functional word finding skills in 8 minutes simple conversation for WNL conversational fluidity over 3 sessions    Time  9    Period  Weeks    Status  On-going       Plan - 11/02/18 1021    Clinical Impression Statement  Moderate dysarthria persists - initiated training in HEP for dysarthria - pt required mod A for volume in HEP and mod to max A for breath support. Compensations for dysarthria in structured taks with min A. Pt reports some improvement in word finding.  Pt mod I in swallow precautions today. Continue skilled ST to maximize intelligiblity and cognitive commuication skills.     Speech Therapy Frequency  2x / week    Duration  --   10 weeks or 21 visits   Treatment/Interventions  Oral motor exercises;Cueing hierarchy;Environmental controls;Compensatory techniques;Cognitive reorganization;Functional tasks;SLP instruction and feedback;Patient/family education;Internal/external aids;Aspiration precaution training;Pharyngeal strengthening exercises;Diet toleration management by SLP;Trials of upgraded texture/liquids    Potential to Achieve Goals  Good    Potential Considerations  Ability to learn/carryover information;Severity of impairments    SLP Home Exercise Plan  [place] [place] [employer] [place]; PHoRTE    Consulted and Agree with Plan of Care  Patient  Patient will  benefit from skilled therapeutic intervention in order to improve the following deficits and impairments:   Cognitive communication deficit  Aphasia  Dysarthria and anarthria    Problem List Patient Active Problem List   Diagnosis Date Noted  . Slow transit constipation   . Benign prostatic hyperplasia with urinary retention   . Right hemiparesis ([place])   . Chronic right shoulder pain   . Primary osteoarthritis of right shoulder   . Left basal ganglia embolic stroke ([place]) 72/94/2627  . CVA (cerebral vascular accident) ([place]) 08/31/2018  . Elevated aspartate aminotransferase level 06/16/2015  . H/O nutritional disorder 06/16/2015  . Excessive salivation 06/16/2015  . 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 Heart palpitations 06/16/2015  . 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 Screening for prostate cancer 08/21/2014  . Atypical chest pain 08/10/2013  . 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 [place], [place], [address] Phone: [phone]   Fax:  [phone]   Name: Edward Glass MRN: [id number] Date of Birth: 1955-12-18

## 2018-11-03 ENCOUNTER — Encounter: Payer: Self-pay | Admitting: Physical Therapy

## 2018-11-03 ENCOUNTER — Ambulatory Visit: Payer: BLUE CROSS/BLUE SHIELD | Admitting: Occupational Therapy

## 2018-11-03 ENCOUNTER — Other Ambulatory Visit: Payer: Self-pay

## 2018-11-03 ENCOUNTER — Ambulatory Visit: Payer: BLUE CROSS/BLUE SHIELD | Admitting: Physical Therapy

## 2018-11-03 DIAGNOSIS — R29818 Other symptoms and signs involving the nervous system: Secondary | ICD-10-CM

## 2018-11-03 DIAGNOSIS — R2689 Other abnormalities of gait and mobility: Secondary | ICD-10-CM

## 2018-11-03 DIAGNOSIS — M6281 Muscle weakness (generalized): Secondary | ICD-10-CM

## 2018-11-03 DIAGNOSIS — M25511 Pain in right shoulder: Secondary | ICD-10-CM

## 2018-11-03 DIAGNOSIS — R293 Abnormal posture: Secondary | ICD-10-CM

## 2018-11-03 DIAGNOSIS — R2681 Unsteadiness on feet: Secondary | ICD-10-CM

## 2018-11-03 DIAGNOSIS — M79641 Pain in right hand: Secondary | ICD-10-CM

## 2018-11-03 DIAGNOSIS — I69351 Hemiplegia and hemiparesis following cerebral infarction affecting right dominant side: Secondary | ICD-10-CM | POA: Diagnosis not present

## 2018-11-03 DIAGNOSIS — R600 Localized edema: Secondary | ICD-10-CM

## 2018-11-03 NOTE — Therapy (Signed)
[facility] [address] [place], [place], [address] Phone: [phone]   Fax:  [phone]  Occupational Therapy Treatment  Patient Details  Name: Edward Glass MRN: [id number] Date of Birth: 1955-12-25 Referring Provider (OT): Dr. Alysia Penna   Encounter Date: 11/03/2018  OT End of Session - 11/03/18 1308    Visit Number  7    Number of Visits  24    Date for OT Re-Evaluation  12/28/18    Authorization Type  [employer] - has 30 visits between OT and PT however if visits occur on same day counts as 1 visit. Discussion with pt re: use of remaining visits at vist 20    OT Start Time  1017    OT Stop Time  1100    OT Time Calculation (min)  43 min    Activity Tolerance  Patient tolerated treatment well    Behavior During Therapy  WFL for tasks assessed/performed       Past Medical History:  Diagnosis Date  . Atypical chest pain 08/10/2013  . Elevated aspartate aminotransferase level 06/16/2015  . Excessive salivation 06/16/2015  . H/O nutritional disorder 06/16/2015  . 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 Screening for prostate cancer 08/21/2014  . 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 HERNIA REPAIR  2006  . left tricep surgery    . LOOP RECORDER INSERTION N/A 09/04/2018   Procedure: LOOP RECORDER INSERTION;  Surgeon: Evans Lance, MD;  Location: [facility];  Service: Cardiovascular;  Laterality: N/A;  . TEE WITHOUT CARDIOVERSION N/A 09/04/2018   Procedure: TRANSESOPHAGEAL ECHOCARDIOGRAM (TEE);  Surgeon: Jerline Pain, MD;  Location: [facility] END[facility];  Service: Cardiovascular;  Laterality: N/A;    There were no vitals filed for this visit.  Subjective Assessment - 11/03/18 1310    Subjective   Pt reports the swelling in his hand is better    Pertinent History  L CVA on 09/14/18  Loop recorder, pt taking blood thinner    Patient  Stated Goals  walk better and use my hand    Currently in Pain?  Yes    Pain Score  5     Pain Location  Shoulder    Pain Orientation  Right    Pain Descriptors / Indicators  Aching    Pain Type  Acute pain    Pain Frequency  Intermittent    Aggravating Factors   movement    Pain Relieving Factors  inactivity           Treatment: Pt arrived wearing. wrist brace and edema glove. Mild edema present today. Pt/ girlfriend were instructed in edema massage, they verbalized understanding. Pt performd AA/ROM finger flexion/ extension and wrist flexion extension, followed by gentle shoulder flexion along tabletop to promote shoulder flexion in pain free range Seated edge of mat, pt perform self range reach for floor , for shoulder flexion thenn supine gentle joint mobs followed by AA/ROM ROM closed chain to mid range with therapist facilitating shoulder and scapular positioning.               OT Education - 11/03/18 1312    Education Details  retrograde massage, finger flexion/ extension, wrist flexion extension and gentle table slides for shoulder ROM    Person(s) Educated  Patient;Other (comment)   girlfriend   Methods  Explanation;Demonstration;Handout    Comprehension  Verbalized understanding;Returned demonstration;Verbal cues required  Pt's girlfriend verbalizes understanding.      OT Short Term Goals - 10/11/18 1659      OT SHORT TERM GOAL #1   Title  Pt and family will be mod I  with RUE positioning/care to address edema and pain in RUE - 11/16/2018    Status  On-going      OT SHORT TERM GOAL #2   Title  Pt and family will be mod I with splint wear and care    Status  On-going      OT SHORT TERM GOAL #3   Title  Pt and dtr will be mod I with HEP for RUE for ROM, addressing pain, functional use of RUE prn    Status  On-going      OT SHORT TERM GOAL #4   Title  Pt will report pain in R hand/wrist no greater than 5/10 during HEP.      Status  On-going      OT  SHORT TERM GOAL #5   Title  Pt will demonstrate the ability for 55* of shoulder flexion as prep for functional low reach    Status  On-going      OT SHORT TERM GOAL #6   Title  Pt will be mod I with toilet and tub bench transfers    Status  On-going      OT SHORT TERM GOAL #7   Title  Pt will be mod I with bathing at shower level    Status  On-going        OT Long Term Goals - 10/11/18 1659      OT LONG TERM GOAL #1   Title  Pt and family will be mod I with upgraded home activities program - 12/28/2018    Status  On-going      OT LONG TERM GOAL #2   Title  Pt will demonstrate ability to grasp and hold light weight object in R hand with pain 2/10 or less    Status  On-going      OT LONG TERM GOAL #3   Title  Pt will demonstrate ability for bilateral low reach to obtain light weigh object     Status  On-going      OT LONG TERM GOAL #4   Title  Pt will be mod I with dressing    Status  On-going      OT LONG TERM GOAL #5   Title  Pt will be mod I with cutting food, AE prn    Status  On-going      OT LONG TERM GOAL #6   Title  Pt will demonstrate ability to use RUE as stabilizer at least 75% of the time in simple ADL tasks    Status  On-going      OT LONG TERM GOAL #7   Title  Pt will be mod I with simple snack/be prep at ambulatory level    Status  On-going      OT LONG TERM GOAL #8   Title  Pt will demonstrate improved postural alignment to decrease pain in RUE as well as decrease risk of falls    Status  On-going            Plan - 11/03/18 1309    Clinical Impression Statement  Pt is progressing towards goals. He reports decreased overall edema wearing glove and wrist brace.     Occupational performance deficits (Please refer to evaluation for details):  ADL's;IADL's;Rest  and Sleep;Work;Leisure;Social Participation    Body Structure / Function / Physical Skills  ADL;Balance;Decreased knowledge of use of  DME;Coordination;Edema;GMC;FMC;Flexibility;IADL;Mobility;Pain;ROM;Strength;Tone;UE functional use;Endurance    Cognitive Skills  Safety Awareness    Rehab Potential  Good    OT Frequency  2x / week    OT Duration  12 weeks    OT Treatment/Interventions  Self-care/ADL training;Aquatic Therapy;Cryotherapy;Therapeutic exercise;Neuromuscular education;Energy conservation;DME and/or AE instruction;Functional Mobility Training;Cognitive remediation/compensation;Balance training;Splinting;Manual Therapy;Passive range of motion;Therapeutic activities;Patient/family education    Plan  continue NMR RUE/trunk and monitor edema Rt hand     Consulted and Agree with Plan of Care  Patient;Family member/caregiver    Family Member Consulted  girlfriend       Patient will benefit from skilled therapeutic intervention in order to improve the following deficits and impairments:  Body Structure / Function / Physical Skills, Cognitive Skills  Visit Diagnosis: Muscle weakness (generalized)  Other symptoms and signs involving the nervous system  Hemiplegia and hemiparesis following cerebral infarction affecting right dominant side (HCC)  Abnormal posture  Acute pain of right shoulder  Pain in right hand  Localized edema    Problem List Patient Active Problem List   Diagnosis Date Noted  . Slow transit constipation   . Benign prostatic hyperplasia with urinary retention   . Right hemiparesis ([place])   . Chronic right shoulder pain   . Primary osteoarthritis of right shoulder   . Left basal ganglia embolic stroke ([place]) 45/36/4680  . CVA (cerebral vascular accident) ([place]) 08/31/2018  . Elevated aspartate aminotransferase level 06/16/2015  . H/O nutritional disorder 06/16/2015  . Excessive salivation 06/16/2015  . 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 Heart palpitations 06/16/2015  . 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 Screening for prostate cancer 08/21/2014  .  Atypical chest pain 08/10/2013  . 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

## 2018-11-03 NOTE — Therapy (Signed)
[facility] [address] [place], [place], [address] Phone: [phone]   Fax:  [phone]  Physical Therapy Treatment  Patient Details  Name: Edward Glass MRN: [id number] Date of Birth: April 01, 1956 Referring Provider (PT): Alysia Penna MD   Encounter Date: 11/03/2018  PT End of Session - 11/03/18 1221    Visit Number  7    Number of Visits  17    Date for PT Re-Evaluation  12/02/18    Authorization Type  BCBS; met $7500 deductible; VL: PT/OT 30, zero used; seen on same day =1 visit; SLP 30 visits    Authorization - Visit Number  7    Authorization - Number of Visits  30    PT Start Time  1104    PT Stop Time  1149    PT Time Calculation (min)  45 min    Equipment Utilized During Treatment  Gait belt    Activity Tolerance  Patient tolerated treatment well    Behavior During Therapy  [facility] for tasks assessed/performed       Past Medical History:  Diagnosis Date  . Atypical chest pain 08/10/2013  . Elevated aspartate aminotransferase level 06/16/2015  . Excessive salivation 06/16/2015  . H/O nutritional disorder 06/16/2015  . 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 Screening for prostate cancer 08/21/2014  . 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 HERNIA REPAIR  2006  . left tricep surgery    . LOOP RECORDER INSERTION N/A 09/04/2018   Procedure: LOOP RECORDER INSERTION;  Surgeon: Evans Lance, MD;  Location: [facility];  Service: Cardiovascular;  Laterality: N/A;  . TEE WITHOUT CARDIOVERSION N/A 09/04/2018   Procedure: TRANSESOPHAGEAL ECHOCARDIOGRAM (TEE);  Surgeon: Jerline Pain, MD;  Location: [facility] ENDOSCOPY;  Service: Cardiovascular;  Laterality: N/A;    There were no vitals filed for this visit.  Subjective Assessment - 11/03/18 1104    Subjective  Want to make sure to work on the steps.      Pertinent History  chronic  rt shoulder pain; had recent fall with rt wrist pain, xray negative for fracture and had steroid injection 09/27/18    Diagnostic tests  MRI-Multiple small foci of acute/early subacute infarctions present in the left MCA distribution concentrated in left basal ganglia; Small chronic infarcts in right parietal and occipital lobes    Patient Stated Goals  improve his walking and balance, be able to live on his own again in his 2 story house    Currently in Pain?  Yes    Pain Score  5     Pain Location  Shoulder    Pain Orientation  Right    Pain Descriptors / Indicators  Aching    Pain Onset  More than a month ago    Aggravating Factors   exercise    Pain Relieving Factors  not moving         [facility] PT Assessment - 11/03/18 0001      Transfers   Transfers  Sit to Stand    Sit to Stand  5: Supervision;With upper extremity assist;Without upper extremity assist;From chair/3-in-1;From bed    Five time sit to stand comments   18.81   no UE support from chair   Stand to Sit  5: Supervision;With upper extremity assist;Without upper extremity assist;To bed;To chair/3-in-1      Ambulation/Gait   Ambulation/Gait  Yes  Ambulation/Gait Assistance  4: Min guard    Ambulation Distance (Feet)  115 Feet   80 x 3 reps   Assistive device  Small based quad cane    Gait Pattern  Step-to pattern;Step-through pattern;Decreased step length - left;Decreased stance time - right;Decreased stride length;Decreased hip/knee flexion - right;Decreased dorsiflexion - right;Right genu recurvatum;Trunk flexed;Poor foot clearance - right    Ambulation Surface  Level;Indoor    Gait velocity  32.8 ft/50.82 (at best)= 0.64 ft/sec    Stairs  Yes    Stairs Assistance  4: Min assist    Stairs Assistance Details (indicate cue type and reason)  Pt very fearful of stance on [name], even to bring L LE up to next step.  He very heavily relies on LUE to "pull" up [name] onto next step    Stair Management Technique  One rail Left;Step  to pattern;Forwards    Number of Stairs  4    Height of Stairs  6                   [facility] Adult PT Treatment/Exercise - 11/03/18 0001      Ambulation/Gait   Gait Comments  Occasional cues posterior R knee to prevent recurvatum.  Pt does c/o pain in knee area (?medial knee) several times when recurvatum occurs in PT session.  No overt episodes of R knee buckling.      Neuro Re-ed    Neuro Re-ed Details   Standing in front of mirror, initially using cane, then not holding on to cane:  standing shoulder width apart BOS with weightshift onto [name] for improved equal weightbearing in static standing, x 10 reps.  Attempted [name] as stance with pre-gait practice of L LE step through, but pt' c/o pain in R knee.  [name] propped on 6" step, with knee flexion in WB position x 10 reps, then [name] as stance, LLE propped on 6" step, tactile cues for upright posture and decreaseing reliance on L UE support on rail( 8 LUE lifts off of rail for increased reliance on [name] in stance).  PT provides tactile cues behind R knee and at R gluts.             PT Education - 11/03/18 1220    Education Details  Girlfriend present-instructed pt and girlfriend to work on Tour manager standing at [employer], to work on equal weightbearing through [employer]) Educated  Patient;Other (comment)   girlfriend   Methods  Explanation;Demonstration    Comprehension  Verbalized understanding;Returned demonstration;Verbal cues required;Tactile cues required       PT Short Term Goals - 11/03/18 1106      PT SHORT TERM GOAL #1   Title  Patient will be independent with HEP (Target all STGs 4 weeks 11/02/2018)    Status  On-going      PT SHORT TERM GOAL #2   Title  Patient will improve gait velocity to 0.75 ft/sec demonstrating progress towards decr fall risk    Baseline  3/10 0.34 ft/sec; 11/03/2018:  0.64 ft/sec    Status  Not Met   progressing     PT SHORT TERM GOAL #3   Title  Patient will improve 5x sit to  stand to <18 seconds demonstrating progress towards decr fall risk    Baseline  3/9  24.62 sec; 11/03/2018:  18.81 sec    Status  Not Met   progressing     PT SHORT TERM GOAL #4  Title  Patient will ambulate modified independent over level surfaces with LRAD x 200 ft, including avoiding objects on his right     Baseline  11/03/2018:  requires supervision/min guard assist at best with quad cane    Status  Not Met      PT SHORT TERM GOAL #5   Title  Patient will ambulate on ramp and up/down curb with close-guarding and LRAD    Status  On-going        PT Long Term Goals - 10/04/18 0735      PT LONG TERM GOAL #1   Title  Patient will be independent with updated HEP and verbalize plan for continued community-based exercise plan (Target all LTGs 8 weeks, 12/02/2018)      PT LONG TERM GOAL #2   Title  Patient will improve gait velocity to 1.30 ft/sec demonstrating progress towards decr fall risk      PT LONG TERM GOAL #3   Title  Patient will improve 5x sit to stand to <12 seconds demonstrating decr fall risk      PT LONG TERM GOAL #4   Title  Patient will ambulate over outdoor paved surfaces modified independent with LRAD, including up/down curb.       PT LONG TERM GOAL #5   Title  Patient will ambulate >300 ft over level indoor surface in <= 6 minutes (6MWT)      Additional Long Term Goals   Additional Long Term Goals  Yes      PT LONG TERM GOAL #6   Title  Patient will go up/down 12 steps with single rail modified independent             Plan - 11/03/18 1221    Clinical Impression Statement  Began assessing STGs today, with STG 2, 3, 4 not met.  STG 2 not met, but pt progressing with gait velocity from 0.34 ft/sec to 0.64 ft/sec.  STG 3 not met, but progressing with 5x sit<>stand, from >24 sec to 18.81 sec.  STG 4 not met with pt needing min guard assistance still with gait.  Pt continues to be fearful of [name] weightbearing and does c/o of R knee pain at times today with  episodes of recurvatum.  Pt willc ontinue to benefit from skilled PT to further address strength, balance,a nd gait training towards independent functional mobility.    Personal Factors and Comorbidities  Comorbidity 1    Comorbidities  chronic rt shoulder pain    Examination-Activity Limitations  Bathing;Bed Mobility;Caring for Others;Carry;Dressing;Locomotion Level;Lift;Reach Overhead;Self Feeding;Stairs    Examination-Participation Restrictions  Community Activity;Driving    Stability/Clinical Decision Making  Stable/Uncomplicated    Rehab Potential  Good    PT Frequency  2x / week    PT Duration  8 weeks    PT Treatment/Interventions  ADLs/Self Care Home Management;Aquatic Therapy;Electrical Stimulation;DME Instruction;Gait training;Stair training;Functional mobility training;Therapeutic activities;Therapeutic exercise;Balance training;Orthotic Fit/Training;Patient/family education;Neuromuscular re-education;Wheelchair mobility training;Passive range of motion;Visual/perceptual remediation/compensation    PT Next Visit Plan  Bioness - add hamstring upper cuff to [name] as able.  Consider trying tall kneel/half kneel positions for hamstring/glut activation and improved [name] weightshifting; Continue NMR and pre-gait training with focus on weight shift to R, hamstring activation, terminal hip extension.  Exercises with Bioness.  Standing balance.   for visits wk of 4/13:  please check STG 1 and 5.  need to schedule more appts   Consulted and Agree with Plan of Care  Patient  Patient will benefit from skilled therapeutic intervention in order to improve the following deficits and impairments:  Abnormal gait, Decreased activity tolerance, Decreased balance, Decreased endurance, Decreased coordination, Decreased cognition, Decreased knowledge of use of DME, Decreased mobility, Decreased safety awareness, Difficulty walking, Decreased strength, Increased edema, Impaired perceived functional ability,  Impaired UE functional use  Visit Diagnosis: Other abnormalities of gait and mobility  Muscle weakness (generalized)  Unsteadiness on feet     Problem List Patient Active Problem List   Diagnosis Date Noted  . Slow transit constipation   . Benign prostatic hyperplasia with urinary retention   . Right hemiparesis ([place])   . Chronic right shoulder pain   . Primary osteoarthritis of right shoulder   . Left basal ganglia embolic stroke ([place]) 57/89/7847  . CVA (cerebral vascular accident) ([place]) 08/31/2018  . Elevated aspartate aminotransferase level 06/16/2015  . H/O nutritional disorder 06/16/2015  . Excessive salivation 06/16/2015  . 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 Heart palpitations 06/16/2015  . 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 Screening for prostate cancer 08/21/2014  . Atypical chest pain 08/10/2013  . 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 11/03/2018, 12:29 PM Frazier Butt., PT  [facility] [address] [place], [place], [address] Phone: [phone]   Fax:  [phone]  Name: Edward Glass MRN: [id number] Date of Birth: 05-13-1956

## 2018-11-06 ENCOUNTER — Encounter: Payer: Self-pay | Admitting: Occupational Therapy

## 2018-11-06 ENCOUNTER — Ambulatory Visit: Payer: BLUE CROSS/BLUE SHIELD

## 2018-11-06 ENCOUNTER — Ambulatory Visit: Payer: BLUE CROSS/BLUE SHIELD | Admitting: Occupational Therapy

## 2018-11-06 ENCOUNTER — Ambulatory Visit: Payer: BLUE CROSS/BLUE SHIELD | Admitting: Physical Therapy

## 2018-11-06 ENCOUNTER — Encounter: Payer: Self-pay | Admitting: Physical Therapy

## 2018-11-06 DIAGNOSIS — R2689 Other abnormalities of gait and mobility: Secondary | ICD-10-CM

## 2018-11-06 DIAGNOSIS — R293 Abnormal posture: Secondary | ICD-10-CM

## 2018-11-06 DIAGNOSIS — R1312 Dysphagia, oropharyngeal phase: Secondary | ICD-10-CM

## 2018-11-06 DIAGNOSIS — I69318 Other symptoms and signs involving cognitive functions following cerebral infarction: Secondary | ICD-10-CM

## 2018-11-06 DIAGNOSIS — R471 Dysarthria and anarthria: Secondary | ICD-10-CM

## 2018-11-06 DIAGNOSIS — M25511 Pain in right shoulder: Secondary | ICD-10-CM

## 2018-11-06 DIAGNOSIS — R41841 Cognitive communication deficit: Secondary | ICD-10-CM

## 2018-11-06 DIAGNOSIS — I69351 Hemiplegia and hemiparesis following cerebral infarction affecting right dominant side: Secondary | ICD-10-CM

## 2018-11-06 DIAGNOSIS — R4701 Aphasia: Secondary | ICD-10-CM

## 2018-11-06 DIAGNOSIS — M6281 Muscle weakness (generalized): Secondary | ICD-10-CM

## 2018-11-06 DIAGNOSIS — R600 Localized edema: Secondary | ICD-10-CM

## 2018-11-06 DIAGNOSIS — R29818 Other symptoms and signs involving the nervous system: Secondary | ICD-10-CM

## 2018-11-06 DIAGNOSIS — R2681 Unsteadiness on feet: Secondary | ICD-10-CM

## 2018-11-06 DIAGNOSIS — M79641 Pain in right hand: Secondary | ICD-10-CM

## 2018-11-06 NOTE — Therapy (Signed)
[facility] [address] [place], [place], [address] Phone: [phone]   Fax:  [phone]  Speech Language Pathology Treatment  Patient Details  Name: Edward Glass MRN: [id number] Date of Birth: 1955-09-04 Referring Provider (SLP): Alysia Penna, MD   Encounter Date: 11/06/2018  End of Session - 11/06/18 1141    Visit Number  5    Number of Visits  21    Date for SLP Re-Evaluation  12/27/18    Authorization Type  30 visits ST (20 planned, save 10?)    Authorization - Visit Number  5    Authorization - Number of Visits  30    SLP Start Time  6384    SLP Stop Time   6659    SLP Time Calculation (min)  42 min       Past Medical History:  Diagnosis Date  . Atypical chest pain 08/10/2013  . Elevated aspartate aminotransferase level 06/16/2015  . Excessive salivation 06/16/2015  . H/O nutritional disorder 06/16/2015  . 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 Screening for prostate cancer 08/21/2014  . 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 HERNIA REPAIR  2006  . left tricep surgery    . LOOP RECORDER INSERTION N/A 09/04/2018   Procedure: LOOP RECORDER INSERTION;  Surgeon: Evans Lance, MD;  Location: [facility];  Service: Cardiovascular;  Laterality: N/A;  . TEE WITHOUT CARDIOVERSION N/A 09/04/2018   Procedure: TRANSESOPHAGEAL ECHOCARDIOGRAM (TEE);  Surgeon: Jerline Pain, MD;  Location: [facility] ENDOSCOPY;  Service: Cardiovascular;  Laterality: N/A;    There were no vitals filed for this visit.  Subjective Assessment - 11/06/18 1108    Patient is accompained by:  Family member   Dena   Currently in Pain?  No/denies            ADULT SLP TREATMENT - 11/06/18 1213      General Information   Behavior/Cognition  Alert;Cooperative;Lethargic;Distractible      Treatment Provided   Treatment provided  Cognitive-Linquistic       Cognitive-Linquistic Treatment   Treatment focused on  Aphasia;Dysarthria    Skilled Treatment  Pt entered room with sub WNL volume, requiring cues for WNL volume. SLP used "Hey Ahh" and extended "Heyyyyyyyyy" to habitualize incr'd effort abdominally and with pt's diaphragm to achieve WNL volume. Pt with very rapid volume fade. Pt performed HEP (PHoRTE) simultaneously (consistent SLP mod-max cues) for breath support and volume as well as modeling target voice to use for loud higher-pitched and loud lower-pitched voice. Minmal difference noted in pitch between the two target pitches. Pt req'd max A consistently faded to mod-max A usually for abdominal breathing (AB) at rest. Pt noted to hold breath at apex of breath as well as arch his back upon inhalation. SLP told pt to practice this in supine, 10 minutes twice each day. SLP reviewed all exercises with pt/friend (dena) - see pt instructions. In simple verbal reasoning task (naming an object provided 3 clues) pt req'd mod A consistently to generate the target word.       Assessment / Recommendations / Plan   Plan  Continue with current plan of care      Dysphagia Recommendations   Diet recommendations  Dysphagia 3 (mechanical soft);Thin liquid    Liquids provided via  Cup    Medication Administration  Whole meds with liquid    Supervision  Patient able  to self feed    Compensations  Slow rate;Small sips/bites    Postural Changes and/or Swallow Maneuvers  Seated upright 90 degrees      Progression Toward Goals   Progression toward goals  Progressing toward goals       SLP Education - 11/06/18 1140    Education Details  Overt s/s aspiration PNA (review), necessary to breath for all HEP and when speaking    Person(s) Educated  Patient    Methods  Explanation;Demonstration;Verbal cues    Comprehension  Verbalized understanding;Need further instruction;Returned demonstration;Verbal cues required       SLP Short Term Goals - 11/06/18 1224       SLP SHORT TERM GOAL #1   Title  Pt will undergo naming assessment and/or cognitive linguistic assessment PRN    Baseline  BNT: 10-24-18    Time  1    Period  Weeks    Status  Partially Met      SLP SHORT TERM GOAL #2   Title  pt will demo improved conversational volume in sentence responses- average mid-upper 60s dB, over two sessions    Time  3    Period  Weeks    Status  On-going      SLP SHORT TERM GOAL #3   Title  pt will demo improved conversational volume in 5 mintues simple conversation with average mid-upper 60s dB, over three sessions    Time  3    Period  Weeks    Status  On-going      SLP SHORT TERM GOAL #4   Title  pt will demo abdominal breathing 60% of the time in 5 minutes conversation over three sessions    Time  3    Period  Weeks    Status  On-going      SLP SHORT TERM GOAL #5   Title  pt will name 3 non-physical deficits over three sessions    Time  3    Period  Weeks    Status  On-going      SLP SHORT TERM GOAL #6   Title  pt/family will provide 3 overt s/s aspiration PNA with modified independence over 2 sessions    Time  2    Period  Weeks    Status  On-going      SLP SHORT TERM GOAL #7   Title  pt will complete simple to mod complex naming tasks pertinent to pt's interests and life experience/s 85% over three sessions    Time  4    Period  Weeks    Status  On-going       SLP Long Term Goals - 11/06/18 1225      SLP LONG TERM GOAL #1   Title  pt will demo improved conversational volume in 8 minutes simple conversation average upper 60s-low 70s dB, over three sessions    Time  8    Period  Weeks   or 21 total visits, for all LTGs   Status  On-going      SLP LONG TERM GOAL #2   Title  pt will demo abdominal breathing 70% of the time in 5 minutes conversation over three sessions    Time  8    Period  Weeks    Status  On-going      SLP LONG TERM GOAL #3   Title  pt will complete a functional, simple organizational task for 25  minutes demonstrating adequate selelctive attention with modified  indpendence, over three sessions    Time  8    Period  Weeks    Status  On-going      SLP LONG TERM GOAL #4   Title  pt will double check answers with speech therapy tasks (anticipatory awareness) with nonverbal cues over three sessions    Time  8    Status  On-going      SLP LONG TERM GOAL #5   Title  SLP will monitor pt to foster cont'd pulmonary health    Time  8    Period  Weeks    Status  On-going      SLP LONG TERM GOAL #6   Title  pt will demo functional word finding skills in 8 minutes simple conversation for WNL conversational fluidity over 3 sessions    Time  8    Period  Weeks    Status  On-going       Plan - 11/06/18 1222    Clinical Impression Statement  Moderate dysarthria persists - see skilled intervention for details. Pt with cognitive deficits as well as anomic deficits. No overt s/s aspiration PNA - which were reviewed with pt today. Continue skilled ST to maximize intelligiblity and cognitive commuication skills.     Speech Therapy Frequency  2x / week    Duration  --   10 weeks or 21 visits   Treatment/Interventions  Oral motor exercises;Cueing hierarchy;Environmental controls;Compensatory techniques;Cognitive reorganization;Functional tasks;SLP instruction and feedback;Patient/family education;Internal/external aids;Aspiration precaution training;Pharyngeal strengthening exercises;Diet toleration management by SLP;Trials of upgraded texture/liquids    Potential to Achieve Goals  Good    Potential Considerations  Ability to learn/carryover information;Severity of impairments    SLP Home Exercise Plan  Loud Hey and Ah; PHoRTE    Consulted and Agree with Plan of Care  Patient       Patient will benefit from skilled therapeutic intervention in order to improve the following deficits and impairments:   Cognitive communication deficit  Aphasia  Dysarthria and anarthria  Dysphagia,  oropharyngeal phase    Problem List Patient Active Problem List   Diagnosis Date Noted  . Slow transit constipation   . Benign prostatic hyperplasia with urinary retention   . Right hemiparesis ([place])   . Chronic right shoulder pain   . Primary osteoarthritis of right shoulder   . Left basal ganglia embolic stroke ([place]) 99/24/2683  . CVA (cerebral vascular accident) ([place]) 08/31/2018  . Elevated aspartate aminotransferase level 06/16/2015  . H/O nutritional disorder 06/16/2015  . Excessive salivation 06/16/2015  . 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 Heart palpitations 06/16/2015  . 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 Screening for prostate cancer 08/21/2014  . Atypical chest pain 08/10/2013  . 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 [place], [place], [address] Phone: [phone]   Fax:  [phone]   Name: DAMARIA STOFKO MRN: [id number] Date of Birth: 1955-12-14

## 2018-11-06 NOTE — Therapy (Signed)
[facility] [address] [place], [place], [address] Phone: [phone]   Fax:  [phone]  Occupational Therapy Treatment  Patient Details  Name: Edward Glass MRN: [id number] Date of Birth: [birth year] Referring Provider (OT): Dr. Alysia Penna   Encounter Date: 11/06/2018  OT End of Session - 11/06/18 1351    Visit Number  8    Number of Visits  24    Date for OT Re-Evaluation  12/28/18    Authorization Type  [employer] - has 30 visits between OT and PT however if visits occur on same day counts as 1 visit. Discussion with pt re: use of remaining visits at vist 20    OT Start Time  1232    OT Stop Time  1316    OT Time Calculation (min)  44 min    Activity Tolerance  Patient tolerated treatment well       Past Medical History:  Diagnosis Date  . Atypical chest pain 08/10/2013  . Elevated aspartate aminotransferase level 06/16/2015  . Excessive salivation 06/16/2015  . H/O nutritional disorder 06/16/2015  . 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 Screening for prostate cancer 08/21/2014  . 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 HERNIA REPAIR  2006  . left tricep surgery    . LOOP RECORDER INSERTION N/A 09/04/2018   Procedure: LOOP RECORDER INSERTION;  Surgeon: Evans Lance, MD;  Location: [facility];  Service: Cardiovascular;  Laterality: N/A;  . TEE WITHOUT CARDIOVERSION N/A 09/04/2018   Procedure: TRANSESOPHAGEAL ECHOCARDIOGRAM (TEE);  Surgeon: Jerline Pain, MD;  Location: [facility] ENDOSCOPY;  Service: Cardiovascular;  Laterality: N/A;    There were no vitals filed for this visit.  Subjective Assessment - 11/06/18 1242    Subjective   My hand is moving better but I don't really use it.    Patient is accompanied by:  Family member   girlfriend   Pertinent History  L CVA on 09/14/18  Loop recorder, pt taking blood thinner    Patient Stated Goals  walk better and use my hand    Currently in Pain?  No/denies                   OT Treatments/Exercises (OP) - 11/06/18 0001      Neurological Re-education Exercises   Other Exercises 1  Neuro re ed to address postural alignment and control in standing, sit to stand, stand to sit and with functional ambulation using RW.  Pt with improved alignment and activation of R side with RW however c/o knee pain.  PT made aware and will address in PT session.  Also addressed RUE in supine in closed chain activity for chest presses, overhead bilateral reach and isolated bilateral elbow flexion/extension.  Pt requires max cues and max tactile cues for grading and direction of movement. Pt with significant apraxia.  Transitioned into sitting to address unilateral low to beginnning mid reach in closed chain with max facilitation due to apraxia, poor sensory, cognition and weakness.                 OT Short Term Goals - 11/06/18 1346      OT SHORT TERM GOAL #1   Title  Pt and family will be mod I  with RUE positioning/care to address edema and pain in RUE - 11/16/2018    Status  On-going  OT SHORT TERM GOAL #2   Title  Pt and family will be mod I with splint wear and care    Status  Achieved      OT SHORT TERM GOAL #3   Title  Pt and dtr will be mod I with HEP for RUE for ROM, addressing pain, functional use of RUE prn    Status  On-going      OT SHORT TERM GOAL #4   Title  Pt will report pain in R hand/wrist no greater than 5/10 during HEP.      Status  Achieved      OT SHORT TERM GOAL #5   Title  Pt will demonstrate the ability for 55* of shoulder flexion as prep for functional low reach    Status  On-going      OT SHORT TERM GOAL #6   Title  Pt will be mod I with toilet and tub bench transfers    Status  On-going      OT SHORT TERM GOAL #7   Title  Pt will be mod I with bathing at shower level    Status  On-going        OT Long Term Goals -  11/06/18 1349      OT LONG TERM GOAL #1   Title  Pt and family will be mod I with upgraded home activities program - 12/28/2018    Status  On-going      OT LONG TERM GOAL #2   Title  Pt will demonstrate ability to grasp and hold light weight object in R hand with pain 2/10 or less    Status  On-going      OT LONG TERM GOAL #3   Title  Pt will demonstrate ability for bilateral low reach to obtain light weigh object     Status  On-going      OT LONG TERM GOAL #4   Title  Pt will be mod I with dressing    Status  On-going      OT LONG TERM GOAL #5   Title  Pt will be mod I with cutting food, AE prn    Status  On-going      OT LONG TERM GOAL #6   Title  Pt will demonstrate ability to use RUE as stabilizer at least 75% of the time in simple ADL tasks    Status  On-going      OT LONG TERM GOAL #7   Title  Pt will be mod I with simple snack/be prep at ambulatory level    Status  On-going      OT LONG TERM GOAL #8   Title  Pt will demonstrate improved postural alignment to decrease pain in RUE as well as decrease risk of falls    Status  On-going            Plan - 11/06/18 1349    Clinical Impression Statement  Pt with slow progress toward goals. Pt with no pain today in RUE.  Pt progress impacted by poor sensation, apraxia and impaired cognition.     Occupational performance deficits (Please refer to evaluation for details):  ADL's;IADL's;Rest and Sleep;Work;Leisure;Social Participation    Body Structure / Function / Physical Skills  ADL;Balance;Decreased knowledge of use of DME;Coordination;Edema;GMC;FMC;Flexibility;IADL;Mobility;Pain;ROM;Strength;Tone;UE functional use;Endurance    Cognitive Skills  Safety Awareness    Rehab Potential  Good    Comorbidities Affecting Occupational Performance:  Presence of comorbidities impacting occupational performance  Comorbidities impacting occupational performance description:  HTN, atypical chest pain, chronic R shoulder pain, OA of R  shoulder, aphasia, apraxia, poor sensation    Modification or Assistance to Complete Evaluation   Max significant modification of tasks or assist is necessary to complete    OT Frequency  2x / week    OT Duration  12 weeks    OT Treatment/Interventions  Self-care/ADL training;Aquatic Therapy;Cryotherapy;Therapeutic exercise;Neuromuscular education;Energy conservation;DME and/or AE instruction;Functional Mobility Training;Cognitive remediation/compensation;Balance training;Splinting;Manual Therapy;Passive range of motion;Therapeutic activities;Patient/family education    Plan  continue NMR RUE/trunk/functional mobility  and monitor edema Rt hand     Consulted and Agree with Plan of Care  Patient;Family member/caregiver    Family Member Consulted  girlfriend       Patient will benefit from skilled therapeutic intervention in order to improve the following deficits and impairments:  Body Structure / Function / Physical Skills, Cognitive Skills  Visit Diagnosis: Muscle weakness (generalized)  Other symptoms and signs involving the nervous system  Hemiplegia and hemiparesis following cerebral infarction affecting right dominant side (HCC)  Abnormal posture  Acute pain of right shoulder  Pain in right hand  Localized edema  Unsteadiness on feet  Other symptoms and signs involving cognitive functions following cerebral infarction    Problem List Patient Active Problem List   Diagnosis Date Noted  . Slow transit constipation   . Benign prostatic hyperplasia with urinary retention   . Right hemiparesis ([place])   . Chronic right shoulder pain   . Primary osteoarthritis of right shoulder   . Left basal ganglia embolic stroke ([place]) 43/32/9518  . CVA (cerebral vascular accident) ([place]) 08/31/2018  . Elevated aspartate aminotransferase level 06/16/2015  . H/O nutritional disorder 06/16/2015  . Excessive salivation 06/16/2015  . 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 Heart palpitations  06/16/2015  . 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 Screening for prostate cancer 08/21/2014  . Atypical chest pain 08/10/2013  . 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 [place], [place], [address] Phone: [phone]   Fax:  [phone]  Name: Edward Glass MRN: [id number] Date of Birth: 1955-07-29

## 2018-11-06 NOTE — Therapy (Signed)
[facility] [address] [place], [place], [address] Phone: [phone]   Fax:  [phone]  Physical Therapy Treatment  Patient Details  Name: Edward Glass MRN: [id number] Date of Birth: 11-Aug-1955 Referring Provider (PT): Alysia Penna MD   Encounter Date: 11/06/2018  PT End of Session - 11/06/18 2129    Visit Number  8    Number of Visits  17    Date for PT Re-Evaluation  12/02/18    Authorization Type  [employer]; met $7500 deductible; VL: PT/OT 30, zero used; seen on same day =1 visit; SLP 30 visits    Authorization - Visit Number  8    Authorization - Number of Visits  30    PT Start Time  5852    PT Stop Time  1100    PT Time Calculation (min)  45 min    Equipment Utilized During Treatment  Gait belt    Activity Tolerance  Patient tolerated treatment well    Behavior During Therapy  [facility] for tasks assessed/performed       Past Medical History:  Diagnosis Date  . Atypical chest pain 08/10/2013  . Elevated aspartate aminotransferase level 06/16/2015  . Excessive salivation 06/16/2015  . H/O nutritional disorder 06/16/2015  . 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 Screening for prostate cancer 08/21/2014  . 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 HERNIA REPAIR  2006  . left tricep surgery    . LOOP RECORDER INSERTION N/A 09/04/2018   Procedure: LOOP RECORDER INSERTION;  Surgeon: Evans Lance, MD;  Location: [facility];  Service: Cardiovascular;  Laterality: N/A;  . TEE WITHOUT CARDIOVERSION N/A 09/04/2018   Procedure: TRANSESOPHAGEAL ECHOCARDIOGRAM (TEE);  Surgeon: Jerline Pain, MD;  Location: [facility] ENDOSCOPY;  Service: Cardiovascular;  Laterality: N/A;    There were no vitals filed for this visit.  Subjective Assessment - 11/06/18 2115    Subjective  Pt states Rt knee is doing better - has hyperextended a couple of times  but overall is doing much better    Patient is accompained by:  --   Friend - Dena   Diagnostic tests  MRI-Multiple small foci of acute/early subacute infarctions present in the left MCA distribution concentrated in left basal ganglia; Small chronic infarcts in right parietal and occipital lobes    Patient Stated Goals  improve his walking and balance, be able to live on his own again in his 2 story house    Currently in Pain?  No/denies                       [facility] Adult PT Treatment/Exercise - 11/06/18 1023      Transfers   Transfers  Sit to Stand    Sit to Stand  4: Min guard    Stand to Sit Details  min UE support used    Number of Reps  10 reps    Comments  Lt foot on balance bubble for incr. [name] weight bearing      Ambulation/Gait   Ambulation/Gait  Yes    Ambulation/Gait Assistance  4: Min guard    Ambulation Distance (Feet)  100 Feet   60' inside // bars without UE support (3 laps inside // bars   Assistive device  Small based quad cane    Gait Pattern  Step-to pattern;Step-through pattern;Decreased step length - left;Decreased stance time -  right;Decreased stride length;Decreased hip/knee flexion - right;Decreased dorsiflexion - right;Right genu recurvatum;Trunk flexed;Poor foot clearance - right    Ambulation Surface  Level;Indoor    Stairs  Yes    Stairs Assistance  4: Min assist    Stairs Assistance Details (indicate cue type and reason)  Pt less fearful with step negotiation today    Stair Management Technique  One rail Left;Step to pattern;Forwards    Number of Stairs  4   2 reps - 8 steps total   Height of Stairs  6    Ramp  4: Min assist   with SBQC   Curb  4: Min assist   with [facility]     Knee/Hip Exercises: Standing   Forward Step Up  Right;1 set;10 reps;Hand Hold: 1   min assist; Rt knee was blocked by PT's knee to prevent hype         Balance Exercises - 11/06/18 2125      Balance Exercises: Standing   Rockerboard  Anterior/posterior;10  reps   2 sets = 20 reps total with min assist for weight shift   Sidestepping  2 reps   inside // bars with LUE support; cues to lift Rt foot    Other Standing Exercises  Pt performed stepping over/back of yardstick on floor inside // bars with LLE for increased weight shift onto [name] - 5 reps;  attempted stepping over black balance beam but pt c/o Lt knee pain with this activity so balance beam was switched for yardstick for smalller step with LLE      Tap ups with Lt foot to 1st step with LUE support on hand rail - 10 reps each; pt's Rt knee blocked by PT's knee to prevent hyperextension  Pt peformed Rt knee extension control exercise with red theraband - in mid-stance, forward stance and backward stance 15 reps each (performed Inside // bars)     PT Short Term Goals - 11/06/18 1016      PT SHORT TERM GOAL #1   Title  Patient will be independent with HEP (Target all STGs 4 weeks 11/02/2018)    Baseline  met 11-06-18 per pt report    Status  Achieved      PT SHORT TERM GOAL #5   Title  Patient will ambulate on ramp and up/down curb with close-guarding and LRAD    Baseline  pt requires min assist with curb & ramp negotiation with [facility] for safety - 11-06-18    Time  4    Period  Weeks    Status  Not Met        PT Long Term Goals - 10/04/18 0735      PT LONG TERM GOAL #1   Title  Patient will be independent with updated HEP and verbalize plan for continued community-based exercise plan (Target all LTGs 8 weeks, 12/02/2018)      PT LONG TERM GOAL #2   Title  Patient will improve gait velocity to 1.30 ft/sec demonstrating progress towards decr fall risk      PT LONG TERM GOAL #3   Title  Patient will improve 5x sit to stand to <12 seconds demonstrating decr fall risk      PT LONG TERM GOAL #4   Title  Patient will ambulate over outdoor paved surfaces modified independent with LRAD, including up/down curb.       PT LONG TERM GOAL #5   Title  Patient will ambulate >300 ft over level  indoor  surface in <= 6 minutes (6MWT)      Additional Long Term Goals   Additional Long Term Goals  Yes      PT LONG TERM GOAL #6   Title  Patient will go up/down 12 steps with single rail modified independent             Plan - 11/06/18 2131    Clinical Impression Statement  Pt met STG #1 per his report of being independent with HEP;  STG #5 not met as pt requires min assist with negotiation of curb and ramp with use of SBQC; pt requires cues for sequence of LE positioning with negotiation of these architectural barriers.  Pt's gait improved today with pt noted to increase weight shift onto [name] and to be somewaht less fearful of Rt knee hyperextension in stance (is still somewhat guarded with full weight shift and [name] weight bearing but not as fearful as he was last week).  Gait velocity also noted to be slightly increased today compared to performance in previous PT session 2 weeks ago.      Personal Factors and Comorbidities  Comorbidity 1    Comorbidities  chronic rt shoulder pain    Examination-Activity Limitations  Bathing;Bed Mobility;Caring for Others;Carry;Dressing;Locomotion Level;Lift;Reach Overhead;Self Feeding;Stairs    Examination-Participation Restrictions  Community Activity;Driving    Rehab Potential  Good    PT Frequency  2x / week    PT Duration  8 weeks    PT Treatment/Interventions  ADLs/Self Care Home Management;Aquatic Therapy;Electrical Stimulation;DME Instruction;Gait training;Stair training;Functional mobility training;Therapeutic activities;Therapeutic exercise;Balance training;Orthotic Fit/Training;Patient/family education;Neuromuscular re-education;Wheelchair mobility training;Passive range of motion;Visual/perceptual remediation/compensation    PT Next Visit Plan  Bioness - add hamstring upper cuff to [name] as able.  Consider trying tall kneel/half kneel positions for hamstring/glut activation and improved [name] weightshifting; Continue NMR and pre-gait training  with focus on weight shift to R, hamstring activation, terminal hip extension.  Exercises with Bioness.  Standing balance.   Note - Santiago Glad wishes for pt to use RW for more symmetrical gait pattern if pt able to tolerate due to c/o Rt knee pain   Consulted and Agree with Plan of Care  Patient    Family Member Consulted  friend - Dena       Patient will benefit from skilled therapeutic intervention in order to improve the following deficits and impairments:  Abnormal gait, Decreased activity tolerance, Decreased balance, Decreased endurance, Decreased coordination, Decreased cognition, Decreased knowledge of use of DME, Decreased mobility, Decreased safety awareness, Difficulty walking, Decreased strength, Increased edema, Impaired perceived functional ability, Impaired UE functional use  Visit Diagnosis: Hemiplegia and hemiparesis following cerebral infarction affecting right dominant side (HCC)  Other abnormalities of gait and mobility  Muscle weakness (generalized)     Problem List Patient Active Problem List   Diagnosis Date Noted  . Slow transit constipation   . Benign prostatic hyperplasia with urinary retention   . Right hemiparesis ([place])   . Chronic right shoulder pain   . Primary osteoarthritis of right shoulder   . Left basal ganglia embolic stroke ([place]) 85/46/2703  . CVA (cerebral vascular accident) ([place]) 08/31/2018  . Elevated aspartate aminotransferase level 06/16/2015  . H/O nutritional disorder 06/16/2015  . Excessive salivation 06/16/2015  . 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 Heart palpitations 06/16/2015  . 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 Screening for prostate cancer 08/21/2014  . Atypical chest pain 08/10/2013  . 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 [place], [place], [address] Phone: [phone]    Fax:  [phone]  Name: MITCHEL DELDUCA MRN: [id number] Date of Birth: 21-Oct-1955

## 2018-11-06 NOTE — Patient Instructions (Signed)
   Do twice each day:   10 loud "HeyyAhhhhhhhhhhhhhhh"  Abdominal breathing for 10 minutes   20 sentences "calling over the fence voice"  20 sentences "low, authoritative voice"

## 2018-11-07 NOTE — Progress Notes (Signed)
[facility] [address] [place]. [place] [address] [phone]       VIRTUAL VISIT FOLLOW UP NOTE  Edward Glass Date of Birth:  Oct 28, 1955 Medical Record Number:  [id number]   Reason for Referral:  hospital stroke follow up    Virtual Visit via Video Note  I connected with on 11/08/18 at  9:15 AM EDT by a video enabled telemedicine application located remotely in my own home and verified that I am speaking with the correct person using two identifiers who was located at their own home and is accompanied by his daughter.   I discussed the limitations of evaluation and management by telemedicine and the availability of in person appointments. The patient expressed understanding and agreed to proceed.   CHIEF COMPLAINT:  Chief Complaint  Patient presents with  . Follow-up    Hospital follow-up    HPI: Edward Glass was initially scheduled today for in office hospital follow-up regarding left MCA patchy infarcts on 08/30/2018 but due to COVID-19 safety precautions, visit transition to telemedicine via WebEx. History obtained from patient, daughter and chart review. Reviewed all radiology images and labs personally.  Edward Glass is a 63 year old male who presented to [facility] ED with right-sided weakness and aphasia.   CT head reviewed which showed age indeterminate small vessel ischemia in the left BG.  CTA showed possible left M1 occlusion versus severe stenosis.  CT perfusion no infarct for but large penumbra.  MRI  brain reviewed and showed left MCA patchy infarcts mostly concentrated in the left BG. He arrived greater than 24h from time since last well, therefore no acute stroke interventions done.  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  He unfortunately had neurological worsening with severe aphasia and dense right hemiplegia with extension of deficits on 09/01/2018.  Since 2010 study's represented severe  stenosis in this area, other possibilities include artery to artery embolization.   When pts dtrs arrive, they both explain that in 2016 after tricep repair surgery they noted a possible arrhythmia and placed him on holter monitor for 30d, but was unrevealing.  2D echo showed an EF of 55 to 60%.  TCD bubble study negative for PFO.  Lower extremity venous Dopplers negative for DVT.  TEE showed small PFO/bubble crossover noted during Valsalva which was not felt to be clinically significant along with no evidence of cardiac thrombus therefore loop recorder placed.   Recommended DAPT for 3 months then Plavix alone due to severe left M1 stenosis.  HTN stable recommended long-term BP goal 1 30-1 50 due left M1 stenosis.  HLD 104 and initiated atorvastatin 40 mg daily.  Other stroke risk factors include family history of stroke but no personal history of stroke.  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  Initiated Prozac on 08/31/2018.  He had residual deficits of right hemiparesis, dysarthria and mixed aphasia and was discharged to [facility] for ongoing therapy.   He has been stable from a stroke standpoint with residual deficits of aphasia, cognitive deficits and right hemiparesis but does endorse improvement.  He continues to participate at neuro rehab PT/OT/ST along with continuing exercises at home.  He is currently ambulating with a quad cane and denies any recent falls.  He does need some assistance with bathing and dressing but continues to be able to do  more on his own each day.  He no longer has any difficulties with swallowing and is currently on a regular diet.  He does endorse increased salivation.  He continues on aspirin and Plavix without side effects of bleeding or bruising.  Continues on atorvastatin without side effects myalgias.  Blood pressures not routinely  monitored at home and encourage daughter and patient to obtain cough to start monitoring.  He does endorse occasional depression difficulties such as feeling down or increased anxiety.  It was recommended to initiate Prozac during hospital admission but states once he went home, he did not continue as he felt it was not needed.  Per review of loop recorder, no report of atrial fibrillation found but per patient and daughter, they were contacted by cardiology stating arrhythmia had been found and is in the process of scheduling visit with cardiology.  No further concerns at this time.  Denies new or worsening stroke/TIA symptoms.     ROS:   14 system review of systems performed and negative with exception of speech difficulty, weakness, memory loss, depression  PMH:  Past Medical History:  Diagnosis Date  . Atypical chest pain 08/10/2013  . Elevated aspartate aminotransferase level 06/16/2015  . Excessive salivation 06/16/2015  . H/O nutritional disorder 06/16/2015  . 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 Screening for prostate cancer 08/21/2014  . 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 HERNIA REPAIR  2006  . left tricep surgery    . LOOP RECORDER INSERTION N/A 09/04/2018   Procedure: LOOP RECORDER INSERTION;  Surgeon: Evans Lance, MD;  Location: [facility];  Service: Cardiovascular;  Laterality: N/A;  . TEE WITHOUT CARDIOVERSION N/A 09/04/2018   Procedure: TRANSESOPHAGEAL ECHOCARDIOGRAM (TEE);  Surgeon: Jerline Pain, MD;  Location: [facility] ENDOSCOPY;  Service: Cardiovascular;  Laterality: N/A;    Social History:  Social History   Socioeconomic History  . Marital status: Married    Spouse name: Not on file  . Number of children: Not on file  . Years of education: Not on file  . Highest education level: Not on file  Occupational History  . Not on file  Social Needs  . Financial resource strain:  Not hard at all  . Food insecurity:    Worry: Never true    Inability: Never true  . Transportation needs:    Medical: No    Non-medical: No  Tobacco Use  . Smoking status: Never Smoker  . Smokeless tobacco: Never Used  Substance and Sexual Activity  . Alcohol use: No  . Drug use: No  . Sexual activity: Not on file  Lifestyle  . Physical activity:    Days per week: 6 days    Minutes per session: 30 min  . Stress: Not at all  Relationships  . Social connections:    Talks on phone: Three times a week    Gets together: Three times a week    Attends religious service: Not on file    Active member of club or organization: Yes    Attends meetings of clubs or organizations: 1 to 4 times per year    Relationship status: Not on file  . Intimate partner violence:    Fear of current or ex partner: No    Emotionally abused: No    Physically abused: No    Forced sexual activity: No  Other Topics Concern  . Not on  file  Social History Narrative  . Not on file    Family History:  Family History  Problem Relation Age of Onset  . Kidney failure Mother   . Cancer Mother   . Prostate cancer Father   . Cancer Father   . Diabetes Father   . Colon cancer Neg Hx   . Esophageal cancer Neg Hx   . Rectal cancer Neg Hx   . Stomach cancer Neg Hx     Medications:   Current Outpatient Medications on File Prior to Visit  Medication Sig Dispense Refill  . acetaminophen (TYLENOL) 325 MG tablet Take 2 tablets (650 mg total) by mouth every 4 (four) hours as needed for mild pain (or temp > 37.5 C (99.5 F)).    Marland Kitchen alfuzosin (UROXATRAL) 10 MG 24 hr tablet Take 1 tablet (10 mg total) by mouth 3 (three) times a week. 60 tablet 0  . aspirin EC 325 MG EC tablet Take 1 tablet (325 mg total) by mouth daily. 30 tablet 0  . diclofenac sodium (VOLTAREN) 1 % GEL Apply 2 g topically 4 (four) times daily. 1 Tube 1  . hydrocortisone (ANUSOL-HC) 25 MG suppository Place 1 suppository (25 mg total) rectally 2  (two) times daily. 12 suppository 0  . hydrocortisone-pramoxine (PROCTOFOAM-HC) rectal foam Place 1 applicator rectally 2 (two) times daily. 10 g 0  . multivitamin (ONE-A-DAY MEN'S) TABS tablet Take 1 tablet by mouth daily.    . polyethylene glycol (MIRALAX / GLYCOLAX) packet Take 17 g by mouth 2 (two) times daily. 14 each 0  . senna-docusate (SENOKOT-S) 8.6-50 MG tablet Take 3 tablets by mouth 2 (two) times daily.     No current facility-administered medications on file prior to visit.     Allergies:  No Known Allergies   Physical Exam  There were no vitals filed for this visit. There is no height or weight on file to calculate BMI. No exam data present  Depression screen [facility] 2/9 11/08/2018  Decreased Interest 0  Down, Depressed, Hopeless 1  PHQ - 2 Score 1  Altered sleeping -  Tired, decreased energy -  Change in appetite -  Feeling bad or failure about yourself  -  Trouble concentrating -  Moving slowly or fidgety/restless -  Suicidal thoughts -  PHQ-9 Score -  Difficult doing work/chores -     General: well developed, well nourished, pleasant middle-aged Hispanic male, seated, in no evident distress Head: head normocephalic and atraumatic.     Neurologic Exam Mental Status: Awake and fully alert.  Mild aphasia.  Oriented to place and time. Recent and remote memory diminished. Attention span, concentration and fund of knowledge mostly appropriate. Mood and affect appropriate.  Cranial Nerves: Extraocular movements full without nystagmus.  Hearing intact to voice. Right lower facial paralysis, tounge deviates towards right, asymmetric shoulder shrug on right side Motor: Right upper extremity + pronator drift, very slow right-handed finger tapping, unable to stand with arms crossed, slight drift right lower extremity; unable to stand on one leg Sensory.: UTA Coordination: decreased right hand dexterity with finger tapping and rapid hand movements, difficulty right toe  tapping Gait and Station: Arises from chair with mild difficulty. Stance is normal. Gait demonstrates hemiplegic gait with assistance of quad cane. difficulty raising on heels and toes Reflexes: UTA     NIHSS  3 Modified Rankin  3    Diagnostic Data (Labs, Imaging, Testing)  CT HEAD WO CONTRAST 08/30/2018 IMPRESSION: 1. Age indeterminate small vessel ischemia  in the left basal ganglia, new since 2010. 2. Right [facility] pattern paranasal sinusitis.  CT ANGIO HEAD W OR WO CONTRAST CT ANGIO NECK W OR WO CONTRAST 08/30/2018 IMPRESSION: 1. Negative CTA for large vessel occlusion. No core infarct evident by CT perfusion. 2. Short-segment severe mid left M1 stenosis, likely similar as compared to previous MRI from 11/19/2008. Associated delayed perfusion throughout the left MCA territory distally. 3. No other hemodynamically significant or correctable stenosis within the major arterial vasculature of the head and neck. 4. Persistent left trigeminal artery.  MR BRAIN WO CONTRAST 08/30/2018 IMPRESSION: 1. Multiple small foci of acute/early subacute infarction are present in the left MCA distribution concentrated in left basal ganglia inclusive of the corona radiata and posterior limb of internal capsule. No hemorrhage or mass effect. 2. Mild chronic microvascular ischemic changes and volume loss of the brain. Small chronic infarcts in right parietal and occipital lobes. 3. Right frontal, ethmoid, and maxillary sinus disease is a right middle meatus obstructive pattern, direct visualization recommended.  CT HEAD WO CONTRAST 09/01/2018 IMPRESSION: More confluent acute infarct in the left basal ganglia and corona radiata when compared to diffusion imaging 2 days ago. No new distribution infarct or hemorrhagic conversion.  ECHOCARDIOGRAM 08/31/2018 IMPRESSIONS  1. The left ventricle has normal systolic function of 60-63%. The cavity size was normal. There is no increased left ventricular  wall thickness. Echo evidence of impaired diastolic relaxation.  2. The right ventricle has normal systolic function. The cavity was normal. There is no increase in right ventricular wall thickness.  3. The mitral valve is normal in structure. No evidence of mitral valve stenosis. No significant mitral regurgitation.  4. The tricuspid valve is normal in structure.  5. The aortic valve is tricuspid There is mild calcification of the aortic valve. No aortic stenosis.  6. The pulmonic valve was normal in structure.  7. The aortic root and ascending aorta are normal in size and structure.  8. No evidence of left ventricular regional wall motion abnormalities.  9. The inferior vena cava is normal in size with greater than 50% respiratory variability. 10. No complete TR doppler jet so unable to estimate PA systolic pressure.  ECHO TEE 09/04/2018 IMPRESSIONS  1. The left ventricle has normal systolic function of 01-60%.  2. The right ventricle has normal systolic function.  3. Very small amount of bubble cross over was noted with cough/Valsalva.  4. The mitral valve is normal in structure.  5. The tricuspid valve was normal in structure.  6. The aortic valve is normal in structure.  7. The aortic root is normal in size and structure.  8. Small patent foramen ovale with predominantly right to left shunting across the atrial septum.  9. Evidence of atrial level shunting detected by color flow Doppler. 10. No evidence of left ventricular regional wall motion abnormalities. 11. There is redundancy of the interatrial septum.      ASSESSMENT: Edward Glass is a 63 y.o. year old male here with left MCA patchy infarct secondary to left M1 occlusion with embolic pattern due to unclear etiology on 08/30/2018 and neurological worsening with extension of deficits on 09/01/2018. Vascular risk factors include intracranial stenosis, HLD and HTN.  He has been stable from a stroke standpoint without new or  worsening stroke/TIA symptoms but does have continued deficits of right hemiparesis, aphasia and cognitive deficits.    PLAN:  1. Left MCA infarct: Continue aspirin 325 mg daily and clopidogrel 75 mg daily  and  atorvastatin for secondary stroke prevention.  Discontinue aspirin and continue on Plavix alone after 12/03/2018 as 49-month DAPT completed.  Maintain strict control of hypertension with blood pressure goal between 1 30-1 50, diabetes with hemoglobin A1c goal below 6.5% and cholesterol with LDL cholesterol (bad cholesterol) goal below 70 mg/dL.  I also advised the patient to eat a healthy diet with plenty of whole grains, cereals, fruits and vegetables, exercise regularly with at least 30 minutes of continuous activity daily and maintain ideal body weight.  Continue to monitor loop recorder for atrial fibrillation. 2. Residual deficits: Continue participation in therapies along with continuation of ongoing home exercises for continued improvement 3. 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  Patient agreed and refill placed. 4. HTN: Advised to continue current treatment regimen.    Advised to continue to monitor at home along with continued follow-up with PCP for management 5. HLD: Advised to continue current treatment regimen along with continued follow-up with PCP for future prescribing and monitoring of lipid panel      Follow up in 3 months or call earlier if needed   Greater than 50% of time during this 25 minute visit was spent on counseling, explanation of diagnosis of left MCA infarct, reviewing risk factor management of HTN and HLD, planning of further management along with potential future management, and discussion with patient and family answering all questions.    Venancio Poisson, AGNP-BC  [facility] [address] [place], [place] [address]  Phone  [phone] Fax [phone] Note: This document was prepared with digital dictation and possible smart phrase technology. Any transcriptional errors that result from this process are unintentional.

## 2018-11-08 ENCOUNTER — Ambulatory Visit: Payer: BLUE CROSS/BLUE SHIELD | Admitting: Rehabilitation

## 2018-11-08 ENCOUNTER — Ambulatory Visit: Payer: BLUE CROSS/BLUE SHIELD

## 2018-11-08 ENCOUNTER — Other Ambulatory Visit: Payer: Self-pay

## 2018-11-08 ENCOUNTER — Encounter: Payer: Self-pay | Admitting: Adult Health

## 2018-11-08 ENCOUNTER — Encounter: Payer: Self-pay | Admitting: Physical Therapy

## 2018-11-08 ENCOUNTER — Ambulatory Visit: Payer: BLUE CROSS/BLUE SHIELD | Admitting: Physical Therapy

## 2018-11-08 ENCOUNTER — Encounter: Payer: BLUE CROSS/BLUE SHIELD | Admitting: Occupational Therapy

## 2018-11-08 ENCOUNTER — Ambulatory Visit (INDEPENDENT_AMBULATORY_CARE_PROVIDER_SITE_OTHER): Payer: BLUE CROSS/BLUE SHIELD | Admitting: Adult Health

## 2018-11-08 ENCOUNTER — Ambulatory Visit: Payer: BLUE CROSS/BLUE SHIELD | Admitting: Occupational Therapy

## 2018-11-08 VITALS — BP 140/88 | HR 67

## 2018-11-08 DIAGNOSIS — R4701 Aphasia: Secondary | ICD-10-CM

## 2018-11-08 DIAGNOSIS — R41841 Cognitive communication deficit: Secondary | ICD-10-CM

## 2018-11-08 DIAGNOSIS — R293 Abnormal posture: Secondary | ICD-10-CM

## 2018-11-08 DIAGNOSIS — I10 Essential (primary) hypertension: Secondary | ICD-10-CM | POA: Diagnosis not present

## 2018-11-08 DIAGNOSIS — R1312 Dysphagia, oropharyngeal phase: Secondary | ICD-10-CM

## 2018-11-08 DIAGNOSIS — I69351 Hemiplegia and hemiparesis following cerebral infarction affecting right dominant side: Secondary | ICD-10-CM

## 2018-11-08 DIAGNOSIS — E785 Hyperlipidemia, unspecified: Secondary | ICD-10-CM | POA: Diagnosis not present

## 2018-11-08 DIAGNOSIS — M79641 Pain in right hand: Secondary | ICD-10-CM

## 2018-11-08 DIAGNOSIS — M25511 Pain in right shoulder: Secondary | ICD-10-CM

## 2018-11-08 DIAGNOSIS — F329 Major depressive disorder, single episode, unspecified: Secondary | ICD-10-CM

## 2018-11-08 DIAGNOSIS — M6281 Muscle weakness (generalized): Secondary | ICD-10-CM

## 2018-11-08 DIAGNOSIS — R29818 Other symptoms and signs involving the nervous system: Secondary | ICD-10-CM

## 2018-11-08 DIAGNOSIS — I63512 Cerebral infarction due to unspecified occlusion or stenosis of left middle cerebral artery: Secondary | ICD-10-CM

## 2018-11-08 DIAGNOSIS — R2689 Other abnormalities of gait and mobility: Secondary | ICD-10-CM

## 2018-11-08 DIAGNOSIS — R2681 Unsteadiness on feet: Secondary | ICD-10-CM

## 2018-11-08 DIAGNOSIS — R471 Dysarthria and anarthria: Secondary | ICD-10-CM

## 2018-11-08 MED ORDER — CLOPIDOGREL BISULFATE 75 MG PO TABS: 75.0000 mg | ORAL_TABLET | Freq: Every day | ORAL | 3 refills | Status: DC

## 2018-11-08 MED ORDER — ATORVASTATIN CALCIUM 40 MG PO TABS: 40.0000 mg | ORAL_TABLET | Freq: Every day | ORAL | 3 refills | Status: DC

## 2018-11-08 MED ORDER — FLUOXETINE HCL 20 MG PO CAPS: 20.0000 mg | ORAL_CAPSULE | Freq: Every day | ORAL | 3 refills | Status: DC

## 2018-11-08 NOTE — Patient Instructions (Signed)
Generate a 3-day workout routine for a 64 year old boy

## 2018-11-08 NOTE — Progress Notes (Signed)
I agree with the above plan 

## 2018-11-08 NOTE — Therapy (Signed)
[facility] [address] [place], [place], [address] Phone: [phone]   Fax:  [phone]  Speech Language Pathology Treatment  Patient Details  Name: Edward Glass MRN: [id number] Date of Birth: 05-03-1956 Referring Provider (SLP): Alysia Penna, MD   Encounter Date: 11/08/2018  End of Session - 11/08/18 1541    Visit Number  6    Number of Visits  21    Date for SLP Re-Evaluation  12/27/18    Authorization Type  30 visits ST (20 planned, save 10?)    Authorization - Visit Number  6    Authorization - Number of Visits  30    SLP Start Time  3888   pt late out of OT   SLP Stop Time   2800    SLP Time Calculation (min)  44 min    Activity Tolerance  Patient tolerated treatment well       Past Medical History:  Diagnosis Date  . Atypical chest pain 08/10/2013  . Elevated aspartate aminotransferase level 06/16/2015  . Excessive salivation 06/16/2015  . H/O nutritional disorder 06/16/2015  . 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 Screening for prostate cancer 08/21/2014  . 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 HERNIA REPAIR  2006  . left tricep surgery    . LOOP RECORDER INSERTION N/A 09/04/2018   Procedure: LOOP RECORDER INSERTION;  Surgeon: Evans Lance, MD;  Location: [facility];  Service: Cardiovascular;  Laterality: N/A;  . TEE WITHOUT CARDIOVERSION N/A 09/04/2018   Procedure: TRANSESOPHAGEAL ECHOCARDIOGRAM (TEE);  Surgeon: Jerline Pain, MD;  Location: [facility] ENDOS[facility];  Service: Cardiovascular;  Laterality: N/A;    There were no vitals filed for this visit.         ADULT SLP TREATMENT - 11/08/18 0001      General Information   Behavior/Cognition  Alert;Cooperative;Lethargic;Distractible      Treatment Provided   Treatment provided  Cognitive-Linquistic      Cognitive-Linquistic Treatment   Treatment  focused on  Cognition;Dysarthria    Skilled Treatment  Cognition: Pt was administered the [employer] Assessment - blind (MoCA -BLIND) edition today and scored 15/22 (WNL= >/+ 18/22). SLP discussed results with pt and friend (Dena). Pt denied memory problems after CVA however only recalled 2/5 words (Dena- "I oculd only remember 3 out of 5!"). Pt endorsed being necessary for daughter to remind him how to post pictures to social media. Pt also did not draw reasoning conclusion for ruler and watch, despite saying "a watch measures time" and "a ruler measures distance" during the time after SLP asked the question. SLP had to provide total cues for pt to see the commonality. SLP highlighted that was likely the same difficulty pt had duirng last session with the 3 clue words describing an object. Pt's friend stated, "he did better with that yesterday" immediately folowing SLP explanation.       Assessment / Recommendations / Plan   Plan  Goals updated;Continue with current plan of care      Progression Toward Goals   Progression toward goals  Progressing toward goals       SLP Education - 11/08/18 1540    Education Details  MoCA blind results, defiict areas noted from assessment    Person(s) Educated  Patient    Methods  Explanation    Comprehension  Need further instruction  SLP Short Term Goals - 11/08/18 1330      SLP SHORT TERM GOAL #1   Title  Pt will undergo naming assessment and/or cognitive linguistic assessment PRN    Baseline  BNT: 10-24-18, MoCA 11-08-18    Time  1    Period  --    Status  Achieved      SLP SHORT TERM GOAL #2   Title  pt will demo improved conversational volume in sentence responses- average mid-upper 60s dB, over two sessions    Time  3    Period  Weeks    Status  On-going      SLP SHORT TERM GOAL #3   Title  pt will demo improved conversational volume in 5 mintues simple conversation with average mid-upper 60s dB, over three sessions    Time  3     Period  Weeks    Status  On-going      SLP SHORT TERM GOAL #4   Title  pt will demo abdominal breathing 60% of the time at rest over two sessions    Time  3    Period  Weeks    Status  Revised   from 'in 5 minutes conversation over three sessions"     SLP SHORT TERM GOAL #5   Title  pt will name 3 non-physical deficits over three sessions    Time  3    Period  Weeks    Status  On-going      SLP SHORT TERM GOAL #6   Title  pt/family will provide 3 overt s/s aspiration PNA with modified independence over 2 sessions    Time  2    Period  Weeks    Status  On-going      SLP SHORT TERM GOAL #7   Title  pt will complete simple to mod complex naming tasks pertinent to pt's interests and life experience/s 85% over three sessions    Time  3    Period  Weeks    Status  On-going       SLP Long Term Goals - 11/08/18 1544      SLP LONG TERM GOAL #1   Title  pt will demo improved conversational volume in 8 minutes simple conversation average upper 60s-low 70s dB, over three sessions    Time  8    Period  Weeks   or 21 total visits, for all LTGs   Status  On-going      SLP LONG TERM GOAL #2   Title  pt will demo abdominal breathing 70% of the time in 5 minutes conversation over three sessions    Time  8    Period  Weeks    Status  On-going      SLP LONG TERM GOAL #3   Title  pt will complete a functional, simple organizational task for 25 minutes demonstrating adequate selelctive attention with modified indpendence, over three sessions    Time  8    Period  Weeks    Status  On-going      SLP LONG TERM GOAL #4   Title  pt will double check answers with speech therapy tasks (anticipatory awareness) with nonverbal cues over three sessions    Time  8    Status  On-going      SLP LONG TERM GOAL #5   Title  SLP will monitor pt to foster cont'd pulmonary health    Time  8  Period  Weeks    Status  On-going      SLP LONG TERM GOAL #6   Title  pt will demo functional word  finding skills in 8 minutes simple conversation for WNL conversational fluidity over 3 sessions    Time  8    Period  Weeks    Status  On-going      SLP LONG TERM GOAL #7   Title  pt will demo compensations for memory in 80% of opportunities    Time  8    Period  Weeks    Status  New       Plan - 11/08/18 1541    Clinical Impression Statement  Moderate dysarthria persists. 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 Pt with memory and reasoning deficits, and awareness deficits also noted. Suspect possible attention deficits as well, maybe as basis for memory deficit. Pt's friend appeared to be downplaying pt's cognitive deficits today. Continue skilled ST to maximize intelligiblity and cognitive commuication skills.     Speech Therapy Frequency  2x / week    Duration  --   10 weeks or 21 visits   Treatment/Interventions  Oral motor exercises;Cueing hierarchy;Environmental controls;Compensatory techniques;Cognitive reorganization;Functional tasks;SLP instruction and feedback;Patient/family education;Internal/external aids;Aspiration precaution training;Pharyngeal strengthening exercises;Diet toleration management by SLP;Trials of upgraded texture/liquids    Potential to Achieve Goals  Good    Potential Considerations  Ability to learn/carryover information;Severity of impairments    SLP Home Exercise Plan  [place] [place] [employer]; PHoRTE    Consulted and Agree with Plan of Care  Patient       Patient will benefit from skilled therapeutic intervention in order to improve the following deficits and impairments:   Cognitive communication deficit  Aphasia  Dysarthria and anarthria  Dysphagia, oropharyngeal phase    Problem List Patient Active Problem List   Diagnosis Date Noted  . Slow transit constipation   . Benign prostatic hyperplasia with urinary retention   . Right hemiparesis ([place])   . Chronic right shoulder pain   . Primary osteoarthritis of right shoulder   . Left  basal ganglia embolic stroke ([place]) 28/36/6294  . CVA (cerebral vascular accident) ([place]) 08/31/2018  . Elevated aspartate aminotransferase level 06/16/2015  . H/O nutritional disorder 06/16/2015  . Excessive salivation 06/16/2015  . 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 Heart palpitations 06/16/2015  . 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 Screening for prostate cancer 08/21/2014  . Atypical chest pain 08/10/2013  . 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 [place], [place], [address] Phone: [phone]   Fax:  [phone]   Name: SHERIFF RODENBERG MRN: [id number] Date of Birth: 1956/06/18

## 2018-11-08 NOTE — Therapy (Signed)
[facility] [address] [place], [place], [address] Phone: [phone]   Fax:  [phone]  Occupational Therapy Treatment  Patient Details  Name: Edward Glass MRN: [id number] Date of Birth: Sep 11, 1955 Referring Provider (OT): Dr. Alysia Penna   Encounter Date: 11/08/2018  OT End of Session - 11/08/18 1449    Visit Number  9    Number of Visits  24    Date for OT Re-Evaluation  12/28/18    Authorization Type  [employer] - has 30 visits between OT and PT however if visits occur on same day counts as 1 visit. Discussion with pt re: use of remaining visits at vist 20    OT Start Time  1245    OT Stop Time  1325    OT Time Calculation (min)  40 min    Activity Tolerance  Patient tolerated treatment well    Behavior During Therapy  [place] for tasks assessed/performed       Past Medical History:  Diagnosis Date  . Atypical chest pain 08/10/2013  . Elevated aspartate aminotransferase level 06/16/2015  . Excessive salivation 06/16/2015  . H/O nutritional disorder 06/16/2015  . 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 Screening for prostate cancer 08/21/2014  . 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 HERNIA REPAIR  2006  . left tricep surgery    . LOOP RECORDER INSERTION N/A 09/04/2018   Procedure: LOOP RECORDER INSERTION;  Surgeon: Evans Lance, MD;  Location: [facility];  Service: Cardiovascular;  Laterality: N/A;  . TEE WITHOUT CARDIOVERSION N/A 09/04/2018   Procedure: TRANSESOPHAGEAL ECHOCARDIOGRAM (TEE);  Surgeon: Jerline Pain, MD;  Location: [facility] ENDOSCOPY;  Service: Cardiovascular;  Laterality: N/A;    There were no vitals filed for this visit.  Subjective Assessment - 11/08/18 1449    Pertinent History  L CVA on 09/14/18  Loop recorder, pt taking blood thinner    Patient Stated Goals  walk better and use my hand    Currently in Pain?   Yes    Pain Score  3     Pain Location  Shoulder    Pain Orientation  Right    Pain Descriptors / Indicators  Tightness    Pain Type  Chronic pain    Pain Onset  More than a month ago    Pain Frequency  Intermittent    Aggravating Factors   movement    Pain Relieving Factors  inactivity           Treatment: Supine gentle joint mobs to left shoulder. Seated body on arm movements with weightbearing through right hand and elbow on elevated surface, mod facilitation for lateral weight shifts, followed by pt reaching overhead with left UE for increased weightbearing and mobilization of trunk. Low range shoulder flexion in seated and standing, mod facilitation, for weight shift to right side. Low range functional reaching with bilateral UE's to retrieve mid size ball, min facilitation.                   OT Short Term Goals - 11/06/18 1346      OT SHORT TERM GOAL #1   Title  Pt and family will be mod I  with RUE positioning/care to address edema and pain in RUE - 11/16/2018    Status  On-going      OT SHORT TERM GOAL #2   Title  Pt  and family will be mod I with splint wear and care    Status  Achieved      OT SHORT TERM GOAL #3   Title  Pt and dtr will be mod I with HEP for RUE for ROM, addressing pain, functional use of RUE prn    Status  On-going      OT SHORT TERM GOAL #4   Title  Pt will report pain in R hand/wrist no greater than 5/10 during HEP.      Status  Achieved      OT SHORT TERM GOAL #5   Title  Pt will demonstrate the ability for 55* of shoulder flexion as prep for functional low reach    Status  On-going      OT SHORT TERM GOAL #6   Title  Pt will be mod I with toilet and tub bench transfers    Status  On-going      OT SHORT TERM GOAL #7   Title  Pt will be mod I with bathing at shower level    Status  On-going        OT Long Term Goals - 11/06/18 1349      OT LONG TERM GOAL #1   Title  Pt and family will be mod I with upgraded home  activities program - 12/28/2018    Status  On-going      OT LONG TERM GOAL #2   Title  Pt will demonstrate ability to grasp and hold light weight object in R hand with pain 2/10 or less    Status  On-going      OT LONG TERM GOAL #3   Title  Pt will demonstrate ability for bilateral low reach to obtain light weigh object     Status  On-going      OT LONG TERM GOAL #4   Title  Pt will be mod I with dressing    Status  On-going      OT LONG TERM GOAL #5   Title  Pt will be mod I with cutting food, AE prn    Status  On-going      OT LONG TERM GOAL #6   Title  Pt will demonstrate ability to use RUE as stabilizer at least 75% of the time in simple ADL tasks    Status  On-going      OT LONG TERM GOAL #7   Title  Pt will be mod I with simple snack/be prep at ambulatory level    Status  On-going      OT LONG TERM GOAL #8   Title  Pt will demonstrate improved postural alignment to decrease pain in RUE as well as decrease risk of falls    Status  On-going            Plan - 11/08/18 1450    Clinical Impression Statement  Pt is progressing slowly towards goals. He had mild shoulder pain today and tightness that improved following therapy.    Body Structure / Function / Physical Skills  ADL;Balance;Decreased knowledge of use of DME;Coordination;Edema;GMC;FMC;Flexibility;IADL;Mobility;Pain;ROM;Strength;Tone;UE functional use;Endurance    Cognitive Skills  Safety Awareness    Rehab Potential  Good    Comorbidities impacting occupational performance description:  HTN, atypical chest pain, chronic R shoulder pain, OA of R shoulder, aphasia, apraxia, poor sensation    OT Frequency  2x / week    OT Duration  12 weeks    OT Treatment/Interventions  Self-care/ADL  training;Aquatic Therapy;Cryotherapy;Therapeutic exercise;Neuromuscular education;Energy conservation;DME and/or AE instruction;Functional Mobility Training;Cognitive remediation/compensation;Balance training;Splinting;Manual  Therapy;Passive range of motion;Therapeutic activities;Patient/family education    Plan  continue NMR RUE/trunk/functional mobility  and monitor edema Rt hand     Consulted and Agree with Plan of Care  Patient;Family member/caregiver    Family Member Consulted  girlfriend       Patient will benefit from skilled therapeutic intervention in order to improve the following deficits and impairments:  Body Structure / Function / Physical Skills, Cognitive Skills  Visit Diagnosis: Other abnormalities of gait and mobility  Other symptoms and signs involving the nervous system  Hemiplegia and hemiparesis following cerebral infarction affecting right dominant side (HCC)  Muscle weakness (generalized)  Acute pain of right shoulder  Pain in right hand  Abnormal posture    Problem List Patient Active Problem List   Diagnosis Date Noted  . Slow transit constipation   . Benign prostatic hyperplasia with urinary retention   . Right hemiparesis ([place])   . Chronic right shoulder pain   . Primary osteoarthritis of right shoulder   . Left basal ganglia embolic stroke ([place]) 36/64/4034  . CVA (cerebral vascular accident) ([place]) 08/31/2018  . Elevated aspartate aminotransferase level 06/16/2015  . H/O nutritional disorder 06/16/2015  . Excessive salivation 06/16/2015  . 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 Heart palpitations 06/16/2015  . 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 Screening for prostate cancer 08/21/2014  . Atypical chest pain 08/10/2013  . 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 [place], [place], [address] Phone: [phone]   Fax:  [phone]  Name: DARNEL MCHAN MRN: [id number] Date of Birth: 1956-04-07

## 2018-11-08 NOTE — Therapy (Signed)
[facility] [address] [place], [place], [address] Phone: [phone]   Fax:  [phone]  Physical Therapy Treatment  Patient Details  Name: Edward Glass MRN: [id number] Date of Birth: 1955-11-22 Referring Provider (PT): Alysia Penna MD   Encounter Date: 11/08/2018  PT End of Session - 11/08/18 1324    Visit Number  9    Number of Visits  17    Date for PT Re-Evaluation  12/02/18    Authorization Type  BCBS; met $7500 deductible; VL: PT/OT 30, zero used; seen on same day =1 visit; SLP 30 visits    Authorization - Visit Number  10   PT/OT combined   Authorization - Number of Visits  30    PT Start Time  4010    PT Stop Time  1235    PT Time Calculation (min)  47 min    Equipment Utilized During Treatment  Other (comment)   Bioness   Activity Tolerance  Patient tolerated treatment well    Behavior During Therapy  [facility] for tasks assessed/performed       Past Medical History:  Diagnosis Date  . Atypical chest pain 08/10/2013  . Elevated aspartate aminotransferase level 06/16/2015  . Excessive salivation 06/16/2015  . H/O nutritional disorder 06/16/2015  . 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 Screening for prostate cancer 08/21/2014  . 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 HERNIA REPAIR  2006  . left tricep surgery    . LOOP RECORDER INSERTION N/A 09/04/2018   Procedure: LOOP RECORDER INSERTION;  Surgeon: Evans Lance, MD;  Location: [facility];  Service: Cardiovascular;  Laterality: N/A;  . TEE WITHOUT CARDIOVERSION N/A 09/04/2018   Procedure: TRANSESOPHAGEAL ECHOCARDIOGRAM (TEE);  Surgeon: Jerline Pain, MD;  Location: [facility] ENDOSCOPY;  Service: Cardiovascular;  Laterality: N/A;    Vitals:   11/08/18 1156  BP: 140/88  Pulse: 67    Subjective Assessment - 11/08/18 1151    Subjective  R shoulder was a little  irritated after last session, thinks it may have been from putting his UE up on the stair rail.  Settled quickly.  R knee is feeling pretty good.      Patient is accompained by:  --   Denman George - Dena   Pertinent History  chronic rt shoulder pain; had recent fall with rt wrist pain, xray negative for fracture and had steroid injection 09/27/18    Limitations  Walking    Diagnostic tests  MRI-Multiple small foci of acute/early subacute infarctions present in the left MCA distribution concentrated in left basal ganglia; Small chronic infarcts in right parietal and occipital lobes    Patient Stated Goals  improve his walking and balance, be able to live on his own again in his 2 story house    Currently in Pain?  Yes    Pain Score  3     Pain Location  Shoulder    Pain Orientation  Right    Pain Descriptors / Indicators  Tightness    Pain Type  Chronic pain                       [facility] Adult PT Treatment/Exercise - 11/08/18 1257      Ambulation/Gait   Ambulation/Gait  Yes    Ambulation/Gait Assistance  4: Min assist    Ambulation/Gait Assistance Details  facilitation as noted  in NMR; ace wrap around R knee to provide compression, increased proprioceptive input and stability to decrease pain during ambulation    Ambulation Distance (Feet)  115 Feet    Assistive device  Small based quad cane    Gait Pattern  Step-to pattern;Step-through pattern;Decreased step length - right;Decreased step length - left;Decreased stance time - right;Decreased stride length;Decreased hip/knee flexion - right;Decreased dorsiflexion - right;Decreased weight shift to right;Lateral trunk lean to left;Trunk flexed    Ambulation Surface  Level;Indoor      Neuro Re-ed    Neuro Re-ed Details   NMR with Bioness on [name] during gait with therapist providing manual facilitation on R side at ribs and pelvis to facilitated increased trunk and hip excursion over to the R and anteriorly during R stance for increased  weight shift and WB through [name].  Also provided facilitation for more upright trunk and full anterior weight shift during L stance to facilitate activation of hamstring knee flexion during R swing.  R genu recurvatum and pain noted when pt maintained R hip retracted/posteriorly rotated.       Modalities   Modalities  Teacher, English as a foreign language Location  R anterior tibialis and R hamstring    Electrical Stimulation Action  closed and open chain ankle DF and knee flexion, hip extension    Electrical Stimulation Parameters  see saved parameters in Tablet 2    Electrical Stimulation Goals  Strength;Neuromuscular facilitation             PT Education - 11/08/18 1256    Education Details  use of neoprene knee brace for OA pain relief during WB/standing.  Rationale for using RW instead of cane: take weight off of R knee and improve balance, improve symmetry and use of RUE, facilitate more normal gait pattern of step through    Person(s) Educated  Patient;Spouse    Methods  Explanation    Comprehension  Verbalized understanding       PT Short Term Goals - 11/06/18 1016      PT SHORT TERM GOAL #1   Title  Patient will be independent with HEP (Target all STGs 4 weeks 11/02/2018)    Baseline  met 11-06-18 per pt report    Status  Achieved      PT SHORT TERM GOAL #5   Title  Patient will ambulate on ramp and up/down curb with close-guarding and LRAD    Baseline  pt requires min assist with curb & ramp negotiation with [facility] for safety - 11-06-18    Time  4    Period  Weeks    Status  Not Met        PT Long Term Goals - 10/04/18 0735      PT LONG TERM GOAL #1   Title  Patient will be independent with updated HEP and verbalize plan for continued community-based exercise plan (Target all LTGs 8 weeks, 12/02/2018)      PT LONG TERM GOAL #2   Title  Patient will improve gait velocity to 1.30 ft/sec demonstrating progress towards decr fall  risk      PT LONG TERM GOAL #3   Title  Patient will improve 5x sit to stand to <12 seconds demonstrating decr fall risk      PT LONG TERM GOAL #4   Title  Patient will ambulate over outdoor paved surfaces modified independent with LRAD, including up/down curb.  PT LONG TERM GOAL #5   Title  Patient will ambulate >300 ft over level indoor surface in <= 6 minutes (6MWT)      Additional Long Term Goals   Additional Long Term Goals  Yes      PT LONG TERM GOAL #6   Title  Patient will go up/down 12 steps with single rail modified independent             Plan - 11/08/18 1304    Clinical Impression Statement  Continued NMR and gait training with use of Bioness with addition of hamstring thigh cuff and therapist providing facilitation at R side.  Pt demonstrated improved hip and knee flexion activation during R swing and decreased knee pain during R stance but continued to feel instability in R knee.  [facility] to purchase neoprene R knee sleeve to improve stability and decrease pain for home ambulation.  Will continue to address and progress towards LTG.    Personal Factors and Comorbidities  Comorbidity 1    Comorbidities  chronic rt shoulder pain    Examination-Activity Limitations  Bathing;Bed Mobility;Caring for Others;Carry;Dressing;Locomotion Level;Lift;Reach Overhead;Self Feeding;Stairs    Examination-Participation Restrictions  Community Activity;Driving    Rehab Potential  Good    PT Frequency  2x / week    PT Duration  8 weeks    PT Treatment/Interventions  ADLs/Self Care Home Management;Aquatic Therapy;Electrical Stimulation;DME Instruction;Gait training;Stair training;Functional mobility training;Therapeutic activities;Therapeutic exercise;Balance training;Orthotic Fit/Training;Patient/family education;Neuromuscular re-education;Wheelchair mobility training;Passive range of motion;Visual/perceptual remediation/compensation    PT Next Visit Plan  Did they get knee brace for  R?  use RW for more symmetry and use of RUE.  Mirror therapy.  Bioness for [name] (hamstring/ant tib).  Consider trying tall kneel/half kneel positions for hamstring/glut activation and improved [name] weightshifting; bridges.  Seated trunk weight shifting to R.  Continue NMR and pre-gait training with focus on weight shift to R, hamstring activation, terminal hip extension.  Exercises with Bioness.  Standing balance.   Note - Santiago Glad wishes for pt to use RW for more symmetrical gait pattern if pt able to tolerate due to c/o Rt knee pain   Consulted and Agree with Plan of Care  Patient    Family Member Consulted  friend - Dena       Patient will benefit from skilled therapeutic intervention in order to improve the following deficits and impairments:  Abnormal gait, Decreased activity tolerance, Decreased balance, Decreased endurance, Decreased coordination, Decreased cognition, Decreased knowledge of use of DME, Decreased mobility, Decreased safety awareness, Difficulty walking, Decreased strength, Increased edema, Impaired perceived functional ability, Impaired UE functional use  Visit Diagnosis: Other abnormalities of gait and mobility  Other symptoms and signs involving the nervous system  Hemiplegia and hemiparesis following cerebral infarction affecting right dominant side (HCC)  Unsteadiness on feet     Problem List Patient Active Problem List   Diagnosis Date Noted  . Slow transit constipation   . Benign prostatic hyperplasia with urinary retention   . Right hemiparesis ([place])   . Chronic right shoulder pain   . Primary osteoarthritis of right shoulder   . Left basal ganglia embolic stroke ([place]) 70/78/6754  . CVA (cerebral vascular accident) ([place]) 08/31/2018  . Elevated aspartate aminotransferase level 06/16/2015  . H/O nutritional disorder 06/16/2015  . Excessive salivation 06/16/2015  . 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 Heart palpitations 06/16/2015  . Type A WPW syndrome  06/16/2015  . 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 Screening for prostate cancer 08/21/2014  . Atypical  chest pain 08/10/2013  . 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 [place], [place], [address] Phone: [phone]   Fax:  [phone]  Name: Edward Glass MRN: [id number] Date of Birth: 10-11-1955

## 2018-11-09 ENCOUNTER — Other Ambulatory Visit: Payer: Self-pay

## 2018-11-09 ENCOUNTER — Ambulatory Visit: Payer: BLUE CROSS/BLUE SHIELD | Admitting: Occupational Therapy

## 2018-11-09 ENCOUNTER — Ambulatory Visit (INDEPENDENT_AMBULATORY_CARE_PROVIDER_SITE_OTHER): Payer: BLUE CROSS/BLUE SHIELD | Admitting: *Deleted

## 2018-11-09 DIAGNOSIS — I63512 Cerebral infarction due to unspecified occlusion or stenosis of left middle cerebral artery: Secondary | ICD-10-CM

## 2018-11-09 LAB — CUP PACEART REMOTE DEVICE CHECK
Date Time Interrogation Session: 20200416212754
Implantable Pulse Generator Implant Date: 20200210

## 2018-11-09 NOTE — Telephone Encounter (Signed)
Pt scheduled with Dr. Taylor 

## 2018-11-10 ENCOUNTER — Ambulatory Visit: Payer: BLUE CROSS/BLUE SHIELD | Admitting: Physical Therapy

## 2018-11-10 ENCOUNTER — Encounter: Payer: BLUE CROSS/BLUE SHIELD | Admitting: Occupational Therapy

## 2018-11-13 ENCOUNTER — Ambulatory Visit: Payer: BLUE CROSS/BLUE SHIELD | Admitting: Physical Therapy

## 2018-11-13 ENCOUNTER — Ambulatory Visit (HOSPITAL_BASED_OUTPATIENT_CLINIC_OR_DEPARTMENT_OTHER): Payer: BLUE CROSS/BLUE SHIELD | Admitting: Physical Medicine & Rehabilitation

## 2018-11-13 ENCOUNTER — Encounter: Payer: BLUE CROSS/BLUE SHIELD | Attending: Registered Nurse

## 2018-11-13 ENCOUNTER — Ambulatory Visit: Payer: BLUE CROSS/BLUE SHIELD

## 2018-11-13 ENCOUNTER — Encounter: Payer: Self-pay | Admitting: Physical Medicine & Rehabilitation

## 2018-11-13 ENCOUNTER — Encounter: Payer: Self-pay | Admitting: Occupational Therapy

## 2018-11-13 ENCOUNTER — Telehealth: Payer: Self-pay | Admitting: Physical Therapy

## 2018-11-13 ENCOUNTER — Encounter: Payer: Self-pay | Admitting: Physical Therapy

## 2018-11-13 ENCOUNTER — Ambulatory Visit: Payer: BLUE CROSS/BLUE SHIELD | Admitting: Occupational Therapy

## 2018-11-13 ENCOUNTER — Other Ambulatory Visit: Payer: Self-pay

## 2018-11-13 VITALS — BP 140/70

## 2018-11-13 DIAGNOSIS — E785 Hyperlipidemia, unspecified: Secondary | ICD-10-CM | POA: Insufficient documentation

## 2018-11-13 DIAGNOSIS — I639 Cerebral infarction, unspecified: Secondary | ICD-10-CM | POA: Diagnosis not present

## 2018-11-13 DIAGNOSIS — I69351 Hemiplegia and hemiparesis following cerebral infarction affecting right dominant side: Secondary | ICD-10-CM

## 2018-11-13 DIAGNOSIS — R41841 Cognitive communication deficit: Secondary | ICD-10-CM

## 2018-11-13 DIAGNOSIS — R4701 Aphasia: Secondary | ICD-10-CM

## 2018-11-13 DIAGNOSIS — G8191 Hemiplegia, unspecified affecting right dominant side: Secondary | ICD-10-CM | POA: Insufficient documentation

## 2018-11-13 DIAGNOSIS — R2681 Unsteadiness on feet: Secondary | ICD-10-CM

## 2018-11-13 DIAGNOSIS — M6281 Muscle weakness (generalized): Secondary | ICD-10-CM

## 2018-11-13 DIAGNOSIS — R293 Abnormal posture: Secondary | ICD-10-CM

## 2018-11-13 DIAGNOSIS — R471 Dysarthria and anarthria: Secondary | ICD-10-CM

## 2018-11-13 DIAGNOSIS — R29818 Other symptoms and signs involving the nervous system: Secondary | ICD-10-CM

## 2018-11-13 DIAGNOSIS — M25511 Pain in right shoulder: Secondary | ICD-10-CM

## 2018-11-13 DIAGNOSIS — R600 Localized edema: Secondary | ICD-10-CM

## 2018-11-13 DIAGNOSIS — I69318 Other symptoms and signs involving cognitive functions following cerebral infarction: Secondary | ICD-10-CM

## 2018-11-13 DIAGNOSIS — R1312 Dysphagia, oropharyngeal phase: Secondary | ICD-10-CM

## 2018-11-13 DIAGNOSIS — M79641 Pain in right hand: Secondary | ICD-10-CM

## 2018-11-13 DIAGNOSIS — R2689 Other abnormalities of gait and mobility: Secondary | ICD-10-CM

## 2018-11-13 NOTE — Therapy (Addendum)
[facility] [address] [place], [place], [address] Phone: [phone]   Fax:  [phone]  Occupational Therapy Treatment  Patient Details  Name: Edward Glass MRN: [id number] Date of Birth: 1955/09/06 Referring Provider (OT): Dr. Alysia Penna   Encounter Date: 11/13/2018  OT End of Session - 11/13/18 1418    Visit Number  10 (total combined count is 11 as OT and PT eval done on different days)   Number of Visits  24    Date for OT Re-Evaluation  12/28/18    Authorization Type  BCBS - has 30 visits between OT and PT however if visits occur on same day counts as 1 visit. Discussion with pt re: use of remaining visits at vist 20    OT Start Time  1147    OT Stop Time  1231    OT Time Calculation (min)  44 min    Activity Tolerance  Patient tolerated treatment well       Past Medical History:  Diagnosis Date  . Atypical chest pain 08/10/2013  . Elevated aspartate aminotransferase level 06/16/2015  . Excessive salivation 06/16/2015  . H/O nutritional disorder 06/16/2015  . 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 Screening for prostate cancer 08/21/2014  . 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 HERNIA REPAIR  2006  . left tricep surgery    . LOOP RECORDER INSERTION N/A 09/04/2018   Procedure: LOOP RECORDER INSERTION;  Surgeon: Evans Lance, MD;  Location: [facility] CV [facility];  Service: Cardiovascular;  Laterality: N/A;  . TEE WITHOUT CARDIOVERSION N/A 09/04/2018   Procedure: TRANSESOPHAGEAL ECHOCARDIOGRAM (TEE);  Surgeon: Jerline Pain, MD;  Location: [facility] ENDOSCOPY;  Service: Cardiovascular;  Laterality: N/A;    There were no vitals filed for this visit.  Subjective Assessment - 11/13/18 1211    Subjective   I still have shoulder pain but I think it 's a litte better.     Pertinent History  L CVA on 09/14/18  Loop recorder, pt taking  blood thinner    Patient Stated Goals  walk better and use my hand    Currently in Pain?  Yes    Pain Score  5     Pain Location  Shoulder    Pain Orientation  Right    Pain Descriptors / Indicators  --   grinding, stiffnes   Pain Type  Chronic pain    Pain Onset  More than a month ago    Pain Frequency  Intermittent    Aggravating Factors   movement. Pt reports that he had shoulder pain before the stroke and was limited in ER and end shoulder flexion. Was told he had "bone on bone and not enough cartilage."  Pain is now 5/10 with any movment but no longer at rest.     Pain Relieving Factors  resting the arm                   OT Treatments/Exercises (OP) - 11/13/18 0001      Neurological Re-education Exercises   Other Exercises 1  Neuro re ed to address sit to stand, dynamic standing balance, functional ambulation with RW with emphasis on postrual alignment and control, increased weight bearing and activation of R side, more automotic movements and stand to sit without use of hands and with [name] as power leg. Also addresed low to beginning mid reach with  RUE in sidelying initially with max facilitation. Transitioned into sitting and focuse on low reach patterns in closed chain activity on moveable surface.  Pt with signficant apraxia and poor sensation as well as cognitive deficits. Pt needs max cues and repetition for carry over.                OT Short Term Goals - 11/13/18 1412      OT SHORT TERM GOAL #1   Title  Pt and family will be mod I  with RUE positioning/care to address edema and pain in RUE - 11/23/2018 (goal adjusted as pt missed one week)    Status  On-going      OT SHORT TERM GOAL #2   Title  Pt and family will be mod I with splint wear and care    Status  Achieved      OT SHORT TERM GOAL #3   Title  Pt and dtr will be mod I with HEP for RUE for ROM, addressing pain, functional use of RUE prn    Status  On-going      OT SHORT TERM GOAL #4   Title   Pt will report pain in R hand/wrist no greater than 5/10 during HEP.      Status  Achieved      OT SHORT TERM GOAL #5   Title  Pt will demonstrate the ability for 55* of shoulder flexion as prep for functional low reach    Status  On-going      OT SHORT TERM GOAL #6   Title  Pt will be mod I with toilet and tub bench transfers    Status  On-going      OT SHORT TERM GOAL #7   Title  Pt will be mod I with bathing at shower level    Status  On-going        OT Long Term Goals - 11/13/18 1415      OT LONG TERM GOAL #1   Title  Pt and family will be mod I with upgraded home activities program - 01/05/2019 (goal adjusted as pt missed one week)    Status  On-going      OT LONG TERM GOAL #2   Title  Pt will demonstrate ability to grasp and hold light weight object in R hand with pain 2/10 or less    Status  On-going      OT LONG TERM GOAL #3   Title  Pt will demonstrate ability for bilateral low reach to obtain light weigh object     Status  On-going      OT LONG TERM GOAL #4   Title  Pt will be mod I with dressing    Status  On-going      OT LONG TERM GOAL #5   Title  Pt will be mod I with cutting food, AE prn    Status  On-going      OT LONG TERM GOAL #6   Title  Pt will demonstrate ability to use RUE as stabilizer at least 75% of the time in simple ADL tasks    Status  On-going      OT LONG TERM GOAL #7   Title  Pt will be mod I with simple snack/be prep at ambulatory level    Status  On-going      OT LONG TERM GOAL #8   Title  Pt will demonstrate improved postural alignment to decrease pain in RUE  as well as decrease risk of falls    Status  On-going            Plan - 11/13/18 1416    Clinical Impression Statement  Pt with slow progress toward goals. Pt with improving functional mobility especially with RW - PT sent order to MD to obtain.  Pt with signficant improvement in alignment/postural control as well as use and attention to RUE with RW.     Occupational  performance deficits (Please refer to evaluation for details):  ADL's;IADL's;Rest and Sleep;Work;Leisure;Social Participation    Body Structure / Function / Physical Skills  ADL;Balance;Decreased knowledge of use of DME;Coordination;Edema;GMC;FMC;Flexibility;IADL;Mobility;Pain;ROM;Strength;Tone;UE functional use;Endurance    Cognitive Skills  Safety Awareness    Rehab Potential  Good    Comorbidities Affecting Occupational Performance:  Presence of comorbidities impacting occupational performance    Comorbidities impacting occupational performance description:  HTN, atypical chest pain, chronic R shoulder pain, OA of R shoulder, aphasia, apraxia, poor sensation    OT Frequency  2x / week    OT Duration  12 weeks    OT Treatment/Interventions  Self-care/ADL training;Aquatic Therapy;Cryotherapy;Therapeutic exercise;Neuromuscular education;Energy conservation;DME and/or AE instruction;Functional Mobility Training;Cognitive remediation/compensation;Balance training;Splinting;Manual Therapy;Passive range of motion;Therapeutic activities;Patient/family education    Plan  check STG"s, especially ADL status, continue NMR RUE/trunk/functional mobility  and monitor edema Rt hand     Consulted and Agree with Plan of Care  Patient       Patient will benefit from skilled therapeutic intervention in order to improve the following deficits and impairments:  Body Structure / Function / Physical Skills, Cognitive Skills  Visit Diagnosis: Other symptoms and signs involving the nervous system  Hemiplegia and hemiparesis following cerebral infarction affecting right dominant side (HCC)  Muscle weakness (generalized)  Acute pain of right shoulder  Pain in right hand  Abnormal posture  Unsteadiness on feet  Localized edema  Other symptoms and signs involving cognitive functions following cerebral infarction    Problem List Patient Active Problem List   Diagnosis Date Noted  . Slow transit  constipation   . Benign prostatic hyperplasia with urinary retention   . Right hemiparesis ([place])   . Chronic right shoulder pain   . Primary osteoarthritis of right shoulder   . Left basal ganglia embolic stroke ([place]) 26/94/8546  . CVA (cerebral vascular accident) ([place]) 08/31/2018  . Elevated aspartate aminotransferase level 06/16/2015  . H/O nutritional disorder 06/16/2015  . Excessive salivation 06/16/2015  . 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 Heart palpitations 06/16/2015  . 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 Screening for prostate cancer 08/21/2014  . Atypical chest pain 08/10/2013  . 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

## 2018-11-13 NOTE — Progress Notes (Signed)
Subjective:    Patient ID: Edward Glass, male    DOB: 24-Apr-1956, 63 y.o.   MRN: [id number]  HPI  Amb Mod I with cane Patient states he is dressing himself. No falls Working on [employer] and [name] Speech louder No new medical issues  Right shoulder pain getting worse, patient received an injection while on the rehabilitation unit at [facility] about 6 weeks ago, on 09/27/2018 right wrist pain has improved since injection while hospitalized  Muscle spasm with hamstring and arm biceps area, advised stretching prior bedtime Patient did not wish to try a spasticity medication for this.  Bowels doing ok without medication  Reviewed most recent speech note 11/08/2018 [facility] cognitive assessment score 15/22 with normal being greater than or equal to 18.  Has ongoing problems with dysarthria as well as word finding deficits related to aphasia Last speech note 11/08/2018 OT working on weight shifts.  Also functional reaching also working on functional tasks such as cutting food Last physical therapy note 11/08/2018, PT indicates right shoulder has been sore, still requiring min assist with curb using small base quad cane  Pain Inventory Average Pain 5 Pain Right Now 5 My pain is intermittent  In the last 24 hours, has pain interfered with the following? General activity 0 Relation with others 0 Enjoyment of life 0 What TIME of day is your pain at its worst? 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 self  Neuro/Psych bladder control problems weakness trouble walking  Prior Studies Any changes since last visit?  no  Physicians involved in your care Any changes since last visit?  no   Family History  Problem Relation Age of Onset  . Kidney failure Mother   . Cancer Mother   .  Prostate cancer Father   . Cancer Father   . Diabetes Father   . Colon cancer Neg Hx   . Esophageal cancer Neg Hx   . Rectal cancer Neg Hx   . Stomach cancer Neg Hx    Social History   Socioeconomic History  . Marital status: Married    Spouse name: Not on file  . Number of children: Not on file  . Years of education: Not on file  . Highest education level: Not on file  Occupational History  . Not on file  Social Needs  . Financial resource strain: Not hard at all  . Food insecurity:    Worry: Never true    Inability: Never true  . Transportation needs:    Medical: No    Non-medical: No  Tobacco Use  . Smoking status: Never Smoker  . Smokeless tobacco: Never Used  Substance and Sexual Activity  . Alcohol use: No  . Drug use: No  . Sexual activity: Not on file  Lifestyle  . Physical activity:    Days per week: 6 days    Minutes per session: 30 min  . Stress: Not at all  Relationships  . Social connections:    Talks on phone: Three times a week    Gets together: Three times a week    Attends religious service: Not on file    Active member of club or organization: Yes    Attends meetings of clubs or organizations: 1 to 4 times per year  Relationship status: Not on file  Other Topics Concern  . Not on file  Social History Narrative  . Not on file   Past Surgical History:  Procedure Laterality Date  . HERNIA REPAIR  2006  . left tricep surgery    . LOOP RECORDER INSERTION N/A 09/04/2018   Procedure: LOOP RECORDER INSERTION;  Surgeon: Evans Lance, MD;  Location: [facility];  Service: Cardiovascular;  Laterality: N/A;  . TEE WITHOUT CARDIOVERSION N/A 09/04/2018   Procedure: TRANSESOPHAGEAL ECHOCARDIOGRAM (TEE);  Surgeon: Jerline Pain, MD;  Location: [facility];  Service: Cardiovascular;  Laterality: N/A;   Past Medical History:  Diagnosis Date  . Atypical chest pain 08/10/2013  . Elevated aspartate aminotransferase level 06/16/2015  . Excessive  salivation 06/16/2015  . H/O nutritional disorder 06/16/2015  . 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 Screening for prostate cancer 08/21/2014  . 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   HENT: Negative.   Eyes: Negative.   Respiratory: Negative.   Cardiovascular: Negative.   Gastrointestinal: Negative.   Endocrine: Negative.   Genitourinary: Positive for frequency.  Musculoskeletal: Positive for arthralgias and gait problem.  Skin: Negative.   Allergic/Immunologic: Negative.   Neurological: Positive for weakness.  Hematological: Negative.   All other systems reviewed and are negative.      Objective:   Physical Exam Patient speech is mildly hypophonic, mild dysarthria.  Has mild disfluency Answers questions appropriately Remainder of physical examination deferred secondary to virtual visit       Assessment & Plan:  #1.  Left basal ganglia infarct also affecting internal capsule.  Has persistent right hemiparesis dysarthria and some aphasia as well as dysphonia.  Overall has been making good progress.  His shoulder pain is increasing it again.  He is about 6 weeks post injection and we can schedule  in 2 weeks here in the office  Answered question from daughter regarding a supplement called Airborne.  This has Ginger vitamin C and Echinacea  Patient will continue outpatient PT OT and speech

## 2018-11-13 NOTE — Patient Instructions (Signed)
Bring the workout next session.

## 2018-11-13 NOTE — Therapy (Signed)
[facility] [address] [place], [place], [address] Phone: [phone]   Fax:  [phone]  Speech Language Pathology Treatment  Patient Details  Name: Edward Glass MRN: [id number] Date of Birth: 23-Oct-1955 Referring Provider (SLP): Alysia Penna, MD   Encounter Date: 11/13/2018  End of Session - 11/13/18 1227    Visit Number  7    Number of Visits  21    Date for SLP Re-Evaluation  12/27/18    Authorization Type  30 visits ST (20 planned, save 10?)    Authorization - Visit Number  7    Authorization - Number of Visits  30    SLP Start Time  1104    SLP Stop Time   1145    SLP Time Calculation (min)  41 min    Activity Tolerance  Patient tolerated treatment well       Past Medical History:  Diagnosis Date  . Atypical chest pain 08/10/2013  . Elevated aspartate aminotransferase level 06/16/2015  . Excessive salivation 06/16/2015  . H/O nutritional disorder 06/16/2015  . 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 Screening for prostate cancer 08/21/2014  . 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 HERNIA REPAIR  2006  . left tricep surgery    . LOOP RECORDER INSERTION N/A 09/04/2018   Procedure: LOOP RECORDER INSERTION;  Surgeon: Evans Lance, MD;  Location: [facility];  Service: Cardiovascular;  Laterality: N/A;  . TEE WITHOUT CARDIOVERSION N/A 09/04/2018   Procedure: TRANSESOPHAGEAL ECHOCARDIOGRAM (TEE);  Surgeon: Jerline Pain, MD;  Location: [facility];  Service: Cardiovascular;  Laterality: N/A;    There were no vitals filed for this visit.  Subjective Assessment - 11/13/18 1110    Subjective  "Dena's at home and she ahs the workout."    Currently in Pain?  Yes    Pain Location  Shoulder    Pain Orientation  Right    Pain Descriptors / Indicators  --   grinding, stiffness   Pain Type  Chronic pain    Pain Onset   More than a month ago    Pain Frequency  Intermittent            ADULT SLP TREATMENT - 11/13/18 1113      General Information   Behavior/Cognition  Alert;Cooperative;Lethargic;Distractible   flat affect     Treatment Provided   Treatment provided  Cognitive-Linquistic      Cognitive-Linquistic Treatment   Treatment focused on  Cognition    Skilled Treatment  Pt denies issues with swallowing during meals. No overt s/s aspiration PNA today. Cognition: SLP educated pt on memory strategies - pt recalled 3/4 7 minutes later, incr'd to 4/4 with extra time. Pt gave 3/4 appropriate examples for these. 20 minutes into the session SLP cancelled Friday's appoitntments due to overschedulling - pt recalled this change with min questioning cues (originally said we changed Wed to Friday). Pt used working Marine scientist and attention in a practical activity- pt maintained functional attention (extra time 75% of the time) in the first 6 minutes however after this req'd extended time and min-mod A usually. SLp educated pt that he will want to take breaks if a task will take longer than 10-15 minutes. Pt did not appear to incr loudness spontaneously unless SLP cued verbally or nonverbally for pt to repeat last utterance.      Assessment / Recommendations / Plan  Plan  Continue with current plan of care      Progression Toward Goals   Progression toward goals  Progressing toward goals       SLP Education - 11/13/18 1226    Education Details  if pt does task >10-15 minutes he will want to take a break     Person(s) Educated  Patient    Methods  Explanation    Comprehension  Verbalized understanding       SLP Short Term Goals - 11/13/18 1229      SLP SHORT TERM GOAL #1   Title  Pt will undergo naming assessment and/or cognitive linguistic assessment PRN    Baseline  BNT: 10-24-18, [facility] 11-08-18    Status  Achieved      SLP SHORT TERM GOAL #2   Title  pt will demo improved conversational volume in  sentence responses- average mid-upper 60s dB, over two sessions    Time  3    Period  Weeks    Status  On-going      SLP SHORT TERM GOAL #3   Title  pt will demo improved conversational volume in 5 mintues simple conversation with average mid-upper 60s dB, over three sessions    Time  3    Period  Weeks    Status  On-going      SLP SHORT TERM GOAL #4   Title  pt will demo abdominal breathing 60% of the time at rest over two sessions    Time  2    Period  Weeks    Status  Revised   from 'in 5 minutes conversation over three sessions"     SLP SHORT TERM GOAL #5   Title  pt will name 3 non-physical deficits over three sessions    Time  2    Period  Weeks    Status  On-going      SLP SHORT TERM GOAL #6   Title  pt/family will provide 3 overt s/s aspiration PNA with modified independence over 2 sessions    Time  1    Period  Weeks    Status  On-going      SLP SHORT TERM GOAL #7   Title  pt will complete simple to mod complex naming tasks pertinent to pt's interests and life experience/s 85% over three sessions    Time  2    Period  Weeks    Status  On-going       SLP Long Term Goals - 11/13/18 1229      SLP LONG TERM GOAL #1   Title  pt will demo improved conversational volume in 8 minutes simple conversation average upper 60s-low 70s dB, over three sessions    Time  7    Period  Weeks   or 21 total visits, for all LTGs   Status  On-going      SLP LONG TERM GOAL #2   Title  pt will demo abdominal breathing 70% of the time in 5 minutes conversation over three sessions    Time  7    Period  Weeks    Status  On-going      SLP LONG TERM GOAL #3   Title  pt will complete a functional, simple organizational task for 25 minutes demonstrating adequate selelctive attention with modified indpendence, over three sessions    Time  8    Period  Weeks    Status  On-going  SLP LONG TERM GOAL #4   Title  pt will double check answers with speech therapy tasks (anticipatory  awareness) with nonverbal cues over three sessions    Time  7    Status  On-going      SLP LONG TERM GOAL #5   Title  SLP will monitor pt to foster cont'd pulmonary health    Time  7    Period  Weeks    Status  On-going      SLP LONG TERM GOAL #6   Title  pt will demo functional word finding skills in 8 minutes simple conversation for WNL conversational fluidity over 3 sessions    Time  7    Period  Weeks    Status  On-going      SLP LONG TERM GOAL #7   Title  pt will demo compensations for memory in 80% of opportunities    Time  7    Period  Weeks    Status  On-going       Plan - 11/13/18 1227    Clinical Impression Statement  Moderate dysarthria persists. SLP targeted pt's cognitive deficits today; attention and working memory deficits seen today especially after approx 7-8 minutes of focused mental work. Pt forgot homework, as Dena did not drive him to tx today. Continue skilled ST to maximize intelligiblity and cognitive commuication skills.     Speech Therapy Frequency  2x / week    Duration  --   10 weeks or 21 visits   Treatment/Interventions  Oral motor exercises;Cueing hierarchy;Environmental controls;Compensatory techniques;Cognitive reorganization;Functional tasks;SLP instruction and feedback;Patient/family education;Internal/external aids;Aspiration precaution training;Pharyngeal strengthening exercises;Diet toleration management by SLP;Trials of upgraded texture/liquids    Potential to Achieve Goals  Good    Potential Considerations  Ability to learn/carryover information;Severity of impairments    SLP Home Exercise Plan  Loud Hey and Ah; PHoRTE    Consulted and Agree with Plan of Care  Patient       Patient will benefit from skilled therapeutic intervention in order to improve the following deficits and impairments:   Cognitive communication deficit  Aphasia  Dysarthria and anarthria  Dysphagia, oropharyngeal phase    Problem List Patient Active Problem  List   Diagnosis Date Noted  . Slow transit constipation   . Benign prostatic hyperplasia with urinary retention   . Right hemiparesis ([place])   . Chronic right shoulder pain   . Primary osteoarthritis of right shoulder   . Left basal ganglia embolic stroke ([place]) 44/81/8563  . CVA (cerebral vascular accident) ([place]) 08/31/2018  . Elevated aspartate aminotransferase level 06/16/2015  . H/O nutritional disorder 06/16/2015  . Excessive salivation 06/16/2015  . 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 Heart palpitations 06/16/2015  . 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 Screening for prostate cancer 08/21/2014  . Atypical chest pain 08/10/2013  . 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 [place], [place], [address] Phone: [phone]   Fax:  [phone]   Name: Edward Glass MRN: [id number] Date of Birth: [birth year]

## 2018-11-13 NOTE — Telephone Encounter (Signed)
Hello Dr. Letta Pate, When Edward Glass was discharged from the hospital he was issued a quad cane (due to lack of RUE activation) for short distance ambulation and a wheelchair for community.  He has significantly increased his walking distance (no longer uses wheelchair) and is demonstrating good return of his RUE movement.    Santiago Glad and I are recommending that Edward Glass use a Conservation officer, nature right now to improve attention to and use of RUE, improve postural alignment for more symmetrical and normal gait pattern, and to reduce the pain he is having in his R knee with ambulation.    He does not have a RW at home, so if you agree, would you please enter an order into Epic for a two wheeled RW?  Thank you, [name], PT, DPT 11/13/18    2:08 PM

## 2018-11-13 NOTE — Therapy (Signed)
[facility] [address] [place], [place], [address] Phone: [phone]   Fax:  [phone]  Physical Therapy Treatment  Patient Details  Name: Edward Glass MRN: [id number] Date of Birth: 1956/07/14 Referring Provider (PT): Alysia Penna MD   Encounter Date: 11/13/2018  PT End of Session - 11/13/18 1428    Visit Number  10    Number of Visits  17    Date for PT Re-Evaluation  12/02/18    Authorization Type  BCBS; met $7500 deductible; VL: PT/OT 30, zero used; seen on same day =1 visit; SLP 30 visits    Authorization - Visit Number  11   PT/OT combined   Authorization - Number of Visits  30    PT Start Time  1230    PT Stop Time  1315    PT Time Calculation (min)  45 min    Equipment Utilized During Treatment  Other (comment)   Bioness   Activity Tolerance  Patient tolerated treatment well    Behavior During Therapy  [facility] for tasks assessed/performed       Past Medical History:  Diagnosis Date  . Atypical chest pain 08/10/2013  . Elevated aspartate aminotransferase level 06/16/2015  . Excessive salivation 06/16/2015  . H/O nutritional disorder 06/16/2015  . 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 Screening for prostate cancer 08/21/2014  . 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 HERNIA REPAIR  2006  . left tricep surgery    . LOOP RECORDER INSERTION N/A 09/04/2018   Procedure: LOOP RECORDER INSERTION;  Surgeon: Evans Lance, MD;  Location: [place] [facility];  Service: Cardiovascular;  Laterality: N/A;  . TEE WITHOUT CARDIOVERSION N/A 09/04/2018   Procedure: TRANSESOPHAGEAL ECHOCARDIOGRAM (TEE);  Surgeon: Jerline Pain, MD;  Location: [facility] ENDOSCOPY;  Service: Cardiovascular;  Laterality: N/A;    There were no vitals filed for this visit.  Subjective Assessment - 11/13/18 1235    Subjective  No falls. He bought knee brace  (neoprene) and it seems to be helping.    Patient is accompained by:  --   Edward Glass - Edward Glass   Pertinent History  chronic rt shoulder pain; had recent fall with rt wrist pain, xray negative for fracture and had steroid injection 09/27/18    Limitations  Walking    Diagnostic tests  MRI-Multiple small foci of acute/early subacute infarctions present in the left MCA distribution concentrated in left basal ganglia; Small chronic infarcts in right parietal and occipital lobes    Patient Stated Goals  improve his walking and balance, be able to live on his own again in his 2 story house    Currently in Pain?  Yes    Pain Score  5     Pain Location  Shoulder    Pain Orientation  Right    Pain Descriptors / Indicators  Sore    Pain Type  Neuropathic pain    Pain Onset  More than a month ago    Pain Frequency  Intermittent    Aggravating Factors   hanging down    Pain Relieving Factors  support                       [facility] Adult PT Treatment/Exercise - 11/13/18 1230      Transfers   Transfers  Sit to Stand    Sit to Stand  5: Supervision;Without  upper extremity assist;From elevated surface   from 24" bar stool   Sit to Stand Details (indicate cue type and reason)  10 reps. tactile, visual (mirror) & verbal cues for weight shift & sequence. Bars close but did not need them to stabilize    Stand to Sit  5: Supervision;Without upper extremity assist;To elevated surface   to 24" bar stool   Stand to Sit Details  10 reps. tactile, visual (mirror) & verbal cues for weight shift & sequence. Bars close but did not need them to stabilize    Comments  --      Ambulation/Gait   Ambulation/Gait  Yes    Ambulation/Gait Assistance  4: Min guard    Ambulation/Gait Assistance Details  tactile & verbal cues on weight shift onto [name], upright trunk with extension to advance [name] & wt shift     Ambulation Distance (Feet)  100 Feet   100' X 2   Assistive device  Rolling walker;Other (Comment)   Rt  AFO, Rt KO (neoprene)   Gait Pattern  Step-through pattern;Decreased step length - left;Decreased stance time - right;Decreased stride length;Decreased hip/knee flexion - right;Decreased dorsiflexion - right;Right genu recurvatum;Trunk flexed;Poor foot clearance - right    Ambulation Surface  Indoor;Level    Stairs  --    Stairs Assistance  --    Stair Management Technique  --    Number of Stairs  --    Height of Stairs  --    Ramp  --    Curb  --    Pre-Gait Activities  in parallel bars, worked on terminal stance wt shift / knee flexion, progressed to engaging hamstrings to lift foot/toe off ground, progressed to advancing [name] without trunk extenstion & hip hiking. Used color tile as target for heel contact / step length.       Neuro Re-ed    Neuro Re-ed Details   standing crossways on foam beam: RUE support on parallel bar - head turns right /left 10 reps, up/down 10 reps.  No UE support / minA static stance with eyes closed 10 secs 3 reps. PT provided tactile, verbal & visual cues for balance reactions.       Knee/Hip Exercises: Standing   Lateral Step Up  Right;1 set;10 reps;Hand Hold: 2;Step Height: 4"    Lateral Step Up Limitations  PT provided tactile, verbal & visual cues for wt shift & sequencing.     Forward Step Up  Right;1 set;10 reps;Hand Hold: 2;Step Height: 4"   min assist; Rt knee was blocked by PT's knee to prevent hype   Forward Step Up Limitations  PT provided tactile, verbal & visual cues for wt shift & sequencing.     Step Down  Right;10 reps;Hand Hold: 2;Step Height: 4"    Step Down Limitations  PT provided tactile, verbal & visual cues for wt shift & sequencing.                PT Short Term Goals - 11/06/18 1016      PT SHORT TERM GOAL #1   Title  Patient will be independent with HEP (Target all STGs 4 weeks 11/02/2018)    Baseline  met 11-06-18 per pt report    Status  Achieved      PT SHORT TERM GOAL #5   Title  Patient will ambulate on ramp and up/down  curb with close-guarding and LRAD    Baseline  pt requires min assist with curb & ramp negotiation  with [facility] for safety - 11-06-18    Time  4    Period  Weeks    Status  Not Met        PT Long Term Goals - 10/04/18 0735      PT LONG TERM GOAL #1   Title  Patient will be independent with updated HEP and verbalize plan for continued community-based exercise plan (Target all LTGs 8 weeks, 12/02/2018)      PT LONG TERM GOAL #2   Title  Patient will improve gait velocity to 1.30 ft/sec demonstrating progress towards decr fall risk      PT LONG TERM GOAL #3   Title  Patient will improve 5x sit to stand to <12 seconds demonstrating decr fall risk      PT LONG TERM GOAL #4   Title  Patient will ambulate over outdoor paved surfaces modified independent with LRAD, including up/down curb.       PT LONG TERM GOAL #5   Title  Patient will ambulate >300 ft over level indoor surface in <= 6 minutes (6MWT)      Additional Long Term Goals   Additional Long Term Goals  Yes      PT LONG TERM GOAL #6   Title  Patient will go up/down 12 steps with single rail modified independent             Plan - 11/13/18 1432    Clinical Impression Statement  Today's skilled session focused on gait with RW with weight shift over [name] & upright trunk with extension to advance or weight shift. PT also worked on step ups, step downs & lateral step ups with [name] control and Balance reactions on foam beam.     Personal Factors and Comorbidities  Comorbidity 1    Comorbidities  chronic rt shoulder pain    Examination-Activity Limitations  Bathing;Bed Mobility;Caring for Others;Carry;Dressing;Locomotion Level;Lift;Reach Overhead;Self Feeding;Stairs    Examination-Participation Restrictions  Community Activity;Driving    Rehab Potential  Good    PT Frequency  2x / week    PT Duration  8 weeks    PT Treatment/Interventions  ADLs/Self Care Home Management;Aquatic Therapy;Electrical Stimulation;DME Instruction;Gait  training;Stair training;Functional mobility training;Therapeutic activities;Therapeutic exercise;Balance training;Orthotic Fit/Training;Patient/family education;Neuromuscular re-education;Wheelchair mobility training;Passive range of motion;Visual/perceptual remediation/compensation    PT Next Visit Plan  use RW for more symmetry and use of RUE.  Mirror therapy.  Bioness for [name] (hamstring/ant tib).  Consider trying tall kneel/half kneel positions for hamstring/glut activation and improved [name] weightshifting; bridges.  Seated trunk weight shifting to R.  Continue NMR and pre-gait training with focus on weight shift to R, hamstring activation, terminal hip extension.  Exercises with Bioness.  Standing balance.   Note - Edward Glass wishes for pt to use RW for more symmetrical gait pattern if pt able to tolerate due to c/o Rt knee pain   Consulted and Agree with Plan of Care  Patient       Patient will benefit from skilled therapeutic intervention in order to improve the following deficits and impairments:  Abnormal gait, Decreased activity tolerance, Decreased balance, Decreased endurance, Decreased coordination, Decreased cognition, Decreased knowledge of use of DME, Decreased mobility, Decreased safety awareness, Difficulty walking, Decreased strength, Increased edema, Impaired perceived functional ability, Impaired UE functional use  Visit Diagnosis: Hemiplegia and hemiparesis following cerebral infarction affecting right dominant side (HCC)  Muscle weakness (generalized)  Abnormal posture  Unsteadiness on feet  Other symptoms and signs involving the nervous system  Problem List Patient Active Problem List   Diagnosis Date Noted  . Slow transit constipation   . Benign prostatic hyperplasia with urinary retention   . Right hemiparesis ([place])   . Chronic right shoulder pain   . Primary osteoarthritis of right shoulder   . Left basal ganglia embolic stroke ([place]) 64/83/0322  . CVA (cerebral  vascular accident) ([place]) 08/31/2018  . Elevated aspartate aminotransferase level 06/16/2015  . H/O nutritional disorder 06/16/2015  . Excessive salivation 06/16/2015  . 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 Heart palpitations 06/16/2015  . 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 Screening for prostate cancer 08/21/2014  . Atypical chest pain 08/10/2013  . 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 [place], [place], [address] Phone: [phone]   Fax:  [phone]  Name: Edward Glass MRN: [id number] Date of Birth: Nov 24, 1955

## 2018-11-14 ENCOUNTER — Ambulatory Visit: Payer: BLUE CROSS/BLUE SHIELD | Admitting: Physical Therapy

## 2018-11-14 ENCOUNTER — Ambulatory Visit: Payer: BLUE CROSS/BLUE SHIELD | Admitting: Occupational Therapy

## 2018-11-14 ENCOUNTER — Ambulatory Visit: Payer: BLUE CROSS/BLUE SHIELD

## 2018-11-14 ENCOUNTER — Telehealth (INDEPENDENT_AMBULATORY_CARE_PROVIDER_SITE_OTHER): Payer: BLUE CROSS/BLUE SHIELD | Admitting: Internal Medicine

## 2018-11-14 ENCOUNTER — Encounter: Payer: BLUE CROSS/BLUE SHIELD | Admitting: Occupational Therapy

## 2018-11-14 DIAGNOSIS — I63512 Cerebral infarction due to unspecified occlusion or stenosis of left middle cerebral artery: Secondary | ICD-10-CM

## 2018-11-14 NOTE — Progress Notes (Signed)
Electrophysiology TeleHealth Note   Due to national recommendations of social distancing due to COVID 19, an audio/video telehealth visit is felt to be most appropriate for this patient at this time.  See MyChart message from today for the patient's consent to telehealth for [facility].   Date:  11/14/2018   ID:  Edward Glass, DOB 30-Jan-1956, MRN [id number]  Location: patient's home  Provider location: [address] [place]  Evaluation Performed: Follow-up visit  PCP:  Marcene Corning, MD  Cardiologist:  No primary care provider on file.  Electrophysiologist:  Dr Lovena Le  Chief Complaint:  " I have been doing ok but I felt my heart racing"  History of Present Illness:    Edward Glass is a 63 y.o. male who presents via audio/video conferencing for a telehealth visit today. The patient is a pleasant 63 yo man with a cryptogenic stroke who underwent ILR insertion. He had a single episode of heart racing lasting 6 seconds where his HR was in the 190 range. He felt palpitations but no sob.  The patient denies symptoms of fevers, chills, cough, or new SOB worrisome for COVID 19.  Past Medical History:  Diagnosis Date  . Atypical chest pain 08/10/2013  . Elevated aspartate aminotransferase level 06/16/2015  . Excessive salivation 06/16/2015  . H/O nutritional disorder 06/16/2015  . 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 Screening for prostate cancer 08/21/2014  . 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 HERNIA REPAIR  2006  . left tricep surgery    . LOOP RECORDER INSERTION N/A 09/04/2018   Procedure: LOOP RECORDER INSERTION;  Surgeon: Evans Lance, MD;  Location: [facility];  Service: Cardiovascular;  Laterality: N/A;  . TEE WITHOUT CARDIOVERSION N/A 09/04/2018   Procedure: TRANSESOPHAGEAL ECHOCARDIOGRAM (TEE);  Surgeon: Jerline Pain, MD;  Location: [facility] ENDOSCOPY;  Service:  Cardiovascular;  Laterality: N/A;    Current Outpatient Medications  Medication Sig Dispense Refill  . acetaminophen (TYLENOL) 325 MG tablet Take 2 tablets (650 mg total) by mouth every 4 (four) hours as needed for mild pain (or temp > 37.5 C (99.5 F)).    Marland Kitchen alfuzosin (UROXATRAL) 10 MG 24 hr tablet Take 1 tablet (10 mg total) by mouth 3 (three) times a week. 60 tablet 0  . aspirin EC 325 MG EC tablet Take 1 tablet (325 mg total) by mouth daily. 30 tablet 0  . atorvastatin (LIPITOR) 40 MG tablet Take 1 tablet (40 mg total) by mouth daily at 6 PM. 90 tablet 3  . clopidogrel (PLAVIX) 75 MG tablet Take 1 tablet (75 mg total) by mouth daily. 90 tablet 3  . diclofenac sodium (VOLTAREN) 1 % GEL Apply 2 g topically 4 (four) times daily. 1 Tube 1  . FLUoxetine (PROZAC) 20 MG capsule Take 1 capsule (20 mg total) by mouth daily. 30 capsule 3  . hydrocortisone (ANUSOL-HC) 25 MG suppository Place 1 suppository (25 mg total) rectally 2 (two) times daily. 12 suppository 0  . hydrocortisone-pramoxine (PROCTOFOAM-HC) rectal foam Place 1 applicator rectally 2 (two) times daily. 10 g 0  . multivitamin (ONE-A-DAY MEN'S) TABS tablet Take 1 tablet by mouth daily.    . polyethylene glycol (MIRALAX / GLYCOLAX) packet Take 17 g by mouth 2 (two) times daily. 14 each 0  . senna-docusate (SENOKOT-S) 8.6-50 MG tablet Take 3 tablets by mouth 2 (two) times daily.  No current facility-administered medications for this visit.     Allergies:   Patient has no known allergies.   Social History:  The patient  reports that he has never smoked. He has never used smokeless tobacco. He reports that he does not drink alcohol or use drugs.   Family History:  The patient's  family history includes Cancer in his father and mother; Diabetes in his father; Kidney failure in his mother; Prostate cancer in his father.   ROS:  Please see the history of present illness.   All other systems are personally reviewed and negative.     Exam:    Vital Signs:  There were no vitals taken for this visit.   Labs/Other Tests and Data Reviewed:    Recent Labs: 09/06/2018: ALT 24; BUN 14; Creatinine, Ser 0.89; Hemoglobin 14.8; Platelets 335; Potassium 4.1; Sodium 139   Wt Readings from Last 3 Encounters:  09/20/18 165 lb 13 oz (75.2 kg)  08/30/18 174 lb 13.2 oz (79.3 kg)  08/27/18 175 lb (79.4 kg)     Other studies personally reviewed:  Last device remote is reviewed from [facility] PDF dated 4/20 which reveals normal device function, atrial tachy at 190/mkn   ASSESSMENT & PLAN:    1.  Cryptogenic stroke - he has had no sustained arrhythmias. I recommended a period of watchful waiting. 2. Dyslipidemia - continue statin therapy.   COVID 19 screen The patient denies symptoms of COVID 19 at this time.  The importance of social distancing was discussed today.  Follow-up:  4-5 months Next remote: 5/20  Current medicines are reviewed at length with the patient today.   The patient does not have concerns regarding his medicines.  The following changes were made today:  none  Labs/ tests ordered today include:  No orders of the defined types were placed in this encounter.    Patient Risk:  after full review of this patients clinical status, I feel that they are at moderate risk at this time.  Today, I have spent 25 minutes with the patient with telehealth technology discussing the above issues.    Signed, Cristopher Peru, MD  11/14/2018 3:13 PM     [facility] [place] [address] [phone] (office) [phone] (fax)

## 2018-11-15 ENCOUNTER — Ambulatory Visit: Payer: BLUE CROSS/BLUE SHIELD | Admitting: Physical Therapy

## 2018-11-15 ENCOUNTER — Ambulatory Visit: Payer: BLUE CROSS/BLUE SHIELD

## 2018-11-15 ENCOUNTER — Ambulatory Visit: Payer: BLUE CROSS/BLUE SHIELD | Admitting: Occupational Therapy

## 2018-11-15 ENCOUNTER — Encounter: Payer: Self-pay | Admitting: Physical Therapy

## 2018-11-15 ENCOUNTER — Other Ambulatory Visit: Payer: Self-pay

## 2018-11-15 DIAGNOSIS — I69318 Other symptoms and signs involving cognitive functions following cerebral infarction: Secondary | ICD-10-CM

## 2018-11-15 DIAGNOSIS — R41841 Cognitive communication deficit: Secondary | ICD-10-CM

## 2018-11-15 DIAGNOSIS — I69351 Hemiplegia and hemiparesis following cerebral infarction affecting right dominant side: Secondary | ICD-10-CM | POA: Diagnosis not present

## 2018-11-15 DIAGNOSIS — M25511 Pain in right shoulder: Secondary | ICD-10-CM

## 2018-11-15 DIAGNOSIS — R471 Dysarthria and anarthria: Secondary | ICD-10-CM

## 2018-11-15 DIAGNOSIS — R4701 Aphasia: Secondary | ICD-10-CM

## 2018-11-15 DIAGNOSIS — R2689 Other abnormalities of gait and mobility: Secondary | ICD-10-CM

## 2018-11-15 DIAGNOSIS — M6281 Muscle weakness (generalized): Secondary | ICD-10-CM

## 2018-11-15 DIAGNOSIS — R29818 Other symptoms and signs involving the nervous system: Secondary | ICD-10-CM

## 2018-11-15 DIAGNOSIS — R2681 Unsteadiness on feet: Secondary | ICD-10-CM

## 2018-11-15 DIAGNOSIS — M79641 Pain in right hand: Secondary | ICD-10-CM

## 2018-11-15 DIAGNOSIS — R1312 Dysphagia, oropharyngeal phase: Secondary | ICD-10-CM

## 2018-11-15 DIAGNOSIS — R293 Abnormal posture: Secondary | ICD-10-CM

## 2018-11-15 NOTE — Therapy (Signed)
[facility] [address] [place], [place], [address] Phone: [phone]   Fax:  [phone]  Occupational Therapy Treatment  Patient Details  Name: Edward Glass MRN: [id number] Date of Birth: 01/21/1956 Referring Provider (OT): Dr. Alysia Penna   Encounter Date: 11/15/2018  OT End of Session - 11/15/18 1308    Visit Number  11    Number of Visits  24    Date for OT Re-Evaluation  12/28/18    Authorization Type  BCBS - has 30 visits between OT and PT however if visits occur on same day counts as 1 visit. Discussion with pt re: use of remaining visits at [facility] - Visit Number  12    Authorization - Number of Visits  24    OT Start Time  1019    OT Stop Time  1100    OT Time Calculation (min)  41 min    Activity Tolerance  Patient tolerated treatment well    Behavior During Therapy  WFL for tasks assessed/performed       Past Medical History:  Diagnosis Date  . Atypical chest pain 08/10/2013  . Elevated aspartate aminotransferase level 06/16/2015  . Excessive salivation 06/16/2015  . H/O nutritional disorder 06/16/2015  . 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 Screening for prostate cancer 08/21/2014  . 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 HERNIA REPAIR  2006  . left tricep surgery    . LOOP RECORDER INSERTION N/A 09/04/2018   Procedure: LOOP RECORDER INSERTION;  Surgeon: Evans Lance, MD;  Location: [facility];  Service: Cardiovascular;  Laterality: N/A;  . TEE WITHOUT CARDIOVERSION N/A 09/04/2018   Procedure: TRANSESOPHAGEAL ECHOCARDIOGRAM (TEE);  Surgeon: Jerline Pain, MD;  Location: [facility] ENDOSCOPY;  Service: Cardiovascular;  Laterality: N/A;    There were no vitals filed for this visit.  Subjective Assessment - 11/15/18 1314    Pertinent History  L CVA on 09/14/18  Loop recorder, pt taking blood  thinner    Patient Stated Goals  walk better and use my hand    Pain Score  3     Pain Location  Shoulder    Pain Orientation  Right    Pain Descriptors / Indicators  Aching    Pain Type  Neuropathic pain    Pain Onset  More than a month ago    Pain Frequency  Intermittent    Aggravating Factors   malpositioning    Pain Relieving Factors  repositioning             Treatment: supine gentle joint mobs to right shoulder followed by lower trunk rotation and RUE shoulder abduction, min facilitation followed by rolling on and off right side for spasticity management, min facilitation v.c Weightbearing through right elbow and hand with body on arm movements lateral trunk flexion, rotation  and weight shifts, mod facilitation. Reaching for floor for gentle shoulder flexion stretch, low range shoulder flexion and elbow extension with hand supported on quad cane. RUE supported on bedside table AA/ROM and self P/ROM supination/ pronation followed by functional grasp release of a cylindrical object, min facilitation.                OT Short Term Goals - 11/15/18 1039      OT SHORT TERM GOAL #1   Title  Pt and family will be mod I  with RUE positioning/care to address edema and pain in RUE - 11/23/2018 (goal adjusted as pt missed one week)    Status  Achieved      OT SHORT TERM GOAL #2   Title  Pt and family will be mod I with splint wear and care    Status  Achieved      OT SHORT TERM GOAL #3   Title  Pt and dtr will be mod I with HEP for RUE for ROM, addressing pain, functional use of RUE prn    Status  On-going      OT SHORT TERM GOAL #4   Title  Pt will report pain in R hand/wrist no greater than 5/10 during HEP.      Status  Achieved      OT SHORT TERM GOAL #5   Title  Pt will demonstrate the ability for 55* of shoulder flexion as prep for functional low reach      OT SHORT TERM GOAL #6   Title  Pt will be mod I with toilet and tub bench transfers    Status   On-going   supervision-min a for leg     OT SHORT TERM GOAL #7   Title  Pt will be mod I with bathing at shower level    Status  On-going   supervision       OT Long Term Goals - 11/13/18 1415      OT LONG TERM GOAL #1   Title  Pt and family will be mod I with upgraded home activities program - 01/05/2019 (goal adjusted as pt missed one week)    Status  On-going      OT LONG TERM GOAL #2   Title  Pt will demonstrate ability to grasp and hold light weight object in R hand with pain 2/10 or less    Status  On-going      OT LONG TERM GOAL #3   Title  Pt will demonstrate ability for bilateral low reach to obtain light weigh object     Status  On-going      OT LONG TERM GOAL #4   Title  Pt will be mod I with dressing    Status  On-going      OT LONG TERM GOAL #5   Title  Pt will be mod I with cutting food, AE prn    Status  On-going      OT LONG TERM GOAL #6   Title  Pt will demonstrate ability to use RUE as stabilizer at least 75% of the time in simple ADL tasks    Status  On-going      OT LONG TERM GOAL #7   Title  Pt will be mod I with simple snack/be prep at ambulatory level    Status  On-going      OT LONG TERM GOAL #8   Title  Pt will demonstrate improved postural alignment to decrease pain in RUE as well as decrease risk of falls    Status  On-going            Plan - 11/15/18 1309    Clinical Impression Statement  pt is progressing towards goals. He reports improving independence with ADLS at home.    Occupational performance deficits (Please refer to evaluation for details):  ADL's;IADL's;Rest and Sleep;Work;Leisure;Social Participation    Body Structure / Function / Physical Skills  ADL;Balance;Decreased knowledge of use of DME;Coordination;Edema;GMC;FMC;Flexibility;IADL;Mobility;Pain;ROM;Strength;Tone;UE functional use;Endurance    Cognitive  Skills  Safety Awareness    Comorbidities impacting occupational performance description:  HTN, atypical chest pain,  chronic R shoulder pain, OA of R shoulder, aphasia, apraxia, poor sensation    OT Frequency  2x / week    OT Duration  12 weeks    OT Treatment/Interventions  Self-care/ADL training;Aquatic Therapy;Cryotherapy;Therapeutic exercise;Neuromuscular education;Energy conservation;DME and/or AE instruction;Functional Mobility Training;Cognitive remediation/compensation;Balance training;Splinting;Manual Therapy;Passive range of motion;Therapeutic activities;Patient/family education    Plan  continue to check STG's-add to HEP, continue NMR RUE/trunk/functional mobility     Consulted and Agree with Plan of Care  Patient       Patient will benefit from skilled therapeutic intervention in order to improve the following deficits and impairments:  Body Structure / Function / Physical Skills, Cognitive Skills  Visit Diagnosis: Hemiplegia and hemiparesis following cerebral infarction affecting right dominant side (HCC)  Muscle weakness (generalized)  Abnormal posture  Unsteadiness on feet  Other symptoms and signs involving the nervous system  Acute pain of right shoulder  Pain in right hand    Problem List Patient Active Problem List   Diagnosis Date Noted  . Slow transit constipation   . Benign prostatic hyperplasia with urinary retention   . Right hemiparesis ([place])   . Chronic right shoulder pain   . Primary osteoarthritis of right shoulder   . Left basal ganglia embolic stroke ([place]) 65/79/0383  . CVA (cerebral vascular accident) ([place]) 08/31/2018  . Elevated aspartate aminotransferase level 06/16/2015  . H/O nutritional disorder 06/16/2015  . Excessive salivation 06/16/2015  . 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 Heart palpitations 06/16/2015  . 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 Screening for prostate cancer 08/21/2014  . Atypical chest pain 08/10/2013  . 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 [place], [place], [address] Phone: [phone]   Fax:  [phone]  Name: ELDRIGE PITKIN MRN: [id number] Date of Birth: Apr 07, 1956

## 2018-11-15 NOTE — Patient Instructions (Signed)
  Please complete the assigned speech therapy homework prior to your next session and return it to the speech therapist at your next visit.  

## 2018-11-15 NOTE — Therapy (Signed)
[facility] [address] [place], [place], [address] Phone: [phone]   Fax:  [phone]  Physical Therapy Treatment  Patient Details  Name: Edward Glass MRN: [id number] Date of Birth: 1956/04/08 Referring Provider (PT): Alysia Penna MD   Encounter Date: 11/15/2018  PT End of Session - 11/15/18 1545    Visit Number  11    Number of Visits  17    Date for PT Re-Evaluation  12/02/18    Authorization Type  BC[employer]; met $7500 deductible; VL: PT/OT 30, zero used; seen on same day =1 visit; SLP 30 visits    Authorization Time Period  VL IS A HARD MAX    Authorization - Visit Number  12   PT/OT combined   Authorization - Number of Visits  30    PT Start Time  1106    PT Stop Time  1150    PT Time Calculation (min)  44 min    Equipment Utilized During Treatment  Other (comment)   Bioness   Activity Tolerance  Patient tolerated treatment well    Behavior During Therapy  [facility] for tasks assessed/performed       Past Medical History:  Diagnosis Date  . Atypical chest pain 08/10/2013  . Elevated aspartate aminotransferase level 06/16/2015  . Excessive salivation 06/16/2015  . H/O nutritional disorder 06/16/2015  . 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 Screening for prostate cancer 08/21/2014  . 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 HERNIA REPAIR  2006  . left tricep surgery    . LOOP RECORDER INSERTION N/A 09/04/2018   Procedure: LOOP RECORDER INSERTION;  Surgeon: Evans Lance, MD;  Location: [facility];  Service: Cardiovascular;  Laterality: N/A;  . TEE WITHOUT CARDIOVERSION N/A 09/04/2018   Procedure: TRANSESOPHAGEAL ECHOCARDIOGRAM (TEE);  Surgeon: Jerline Pain, MD;  Location: [facility] ENDOS[facility];  Service: Cardiovascular;  Laterality: N/A;    There were no vitals filed for this visit.  Subjective Assessment - 11/15/18 1110    Subjective  Was a little sore after ambulating on Monday with PT/OT.  Knee is feeling pretty good.  Shoulder feels better after OT.  No order for RW yet.,    Patient is accompained by:  --   Friend - Dena   Pertinent History  chronic rt shoulder pain; had recent fall with rt wrist pain, xray negative for fracture and had steroid injection 09/27/18    Limitations  Walking    Diagnostic tests  MRI-Multiple small foci of acute/early subacute infarctions present in the left MCA distribution concentrated in left basal ganglia; Small chronic infarcts in right parietal and occipital lobes    Patient Stated Goals  improve his walking and balance, be able to live on his own again in his 2 story house    Currently in Pain?  No/denies    Pain Onset  More than a month ago                       [facility] Adult PT Treatment/Exercise - 11/15/18 1110      Ambulation/Gait   Ambulation/Gait  Yes    Ambulation/Gait Assistance  4: Min guard    Ambulation/Gait Assistance Details  with Bioness in gait mode and therapist on R side providing cues and facilitation for full R lateral and anterior weight shift of COG over [name].  Pt demonstrating improved weight shift  and WB through [name] during stance phase and quicker, longer step through with LLE.  Also required facilitation for full anterior weight shift during L stance to increase R foot clearance and step length.  Decreased recurvatum noted today    Ambulation Distance (Feet)  50 Feet    Assistive device  Large base quad cane    Gait Pattern  Step-through pattern;Decreased step length - right;Decreased step length - left;Decreased stance time - right;Decreased hip/knee flexion - right;Decreased dorsiflexion - right;Decreased weight shift to right;Lateral trunk lean to left    Ambulation Surface  Indoor;Level      Neuro Re-ed    Neuro Re-ed Details   NMR at counter with Bioness in training mode; performed WB through RUE on countertop pushing towel along  counter top across midline to L and then out to R outside of BOS with therapist providing cues at pelvis and ribs for rotation and excursion to R to facilitate pelvic/trunk rotation and full lateral weight shifting to R side      Knee/Hip Exercises: Supine   Bridges  Strengthening;Both;1 set;10 reps    Bridges Limitations  with Bioness in training mode for hamstring activation training with verbal cues for core activation "roll hips up" and for increased hamstring activation "press through your heel".  Also required cues to decrease knee extension through [name]    Single Leg Bridge  Strengthening;Right;1 set;5 reps   Bioness in training mode, lifting LLE     Modalities   Modalities  Electrical Stimulation      Electrical Stimulation   Electrical Stimulation Location  R anterior tibialis and R hamstring    Electrical Stimulation Action  closed and open chain ankle DF and knee flexion, hip extension    Electrical Stimulation Parameters  see saved parameters in Tablet 2    Electrical Stimulation Goals  Strength;Neuromuscular facilitation             PT Education - 11/15/18 1544    Education Details  discussed process for obtaining RW    Person(s) Educated  Patient;Spouse    Methods  Explanation    Comprehension  Verbalized understanding       PT Short Term Goals - 11/06/18 1016      PT SHORT TERM GOAL #1   Title  Patient will be independent with HEP (Target all STGs 4 weeks 11/02/2018)    Baseline  met 11-06-18 per pt report    Status  Achieved      PT SHORT TERM GOAL #5   Title  Patient will ambulate on ramp and up/down curb with close-guarding and LRAD    Baseline  pt requires min assist with curb & ramp negotiation with [facility] for safety - 11-06-18    Time  4    Period  Weeks    Status  Not Met        PT Long Term Goals - 10/04/18 0735      PT LONG TERM GOAL #1   Title  Patient will be independent with updated HEP and verbalize plan for continued community-based exercise  plan (Target all LTGs 8 weeks, 12/02/2018)      PT LONG TERM GOAL #2   Title  Patient will improve gait velocity to 1.30 ft/sec demonstrating progress towards decr fall risk      PT LONG TERM GOAL #3   Title  Patient will improve 5x sit to stand to <12 seconds demonstrating decr fall risk      PT LONG  TERM GOAL #4   Title  Patient will ambulate over outdoor paved surfaces modified independent with LRAD, including up/down curb.       PT LONG TERM GOAL #5   Title  Patient will ambulate >300 ft over level indoor surface in <= 6 minutes (6MWT)      Additional Long Term Goals   Additional Long Term Goals  Yes      PT LONG TERM GOAL #6   Title  Patient will go up/down 12 steps with single rail modified independent             Plan - 11/15/18 1549    Clinical Impression Statement  Treatment session continued to focus on NMR, strengthening and gait training with use of Bioness on R side; introduced combination of RUE WB and rotation to standing NMR to improve attention, seuqencing and activation.  Pt is demonstrating improved weight shifting and weight acceptance to R side today and continues to report decreased knee pain.  Will continue to progress towards LTG.    Personal Factors and Comorbidities  Comorbidity 1    Comorbidities  chronic rt shoulder pain    Examination-Activity Limitations  Bathing;Bed Mobility;Caring for Others;Carry;Dressing;Locomotion Level;Lift;Reach Overhead;Self Feeding;Stairs    Examination-Participation Restrictions  Community Activity;Driving    Rehab Potential  Good    PT Frequency  2x / week    PT Duration  8 weeks    PT Treatment/Interventions  ADLs/Self Care Home Management;Aquatic Therapy;Electrical Stimulation;DME Instruction;Gait training;Stair training;Functional mobility training;Therapeutic activities;Therapeutic exercise;Balance training;Orthotic Fit/Training;Patient/family education;Neuromuscular re-education;Wheelchair mobility training;Passive range  of motion;Visual/perceptual remediation/compensation    PT Next Visit Plan  Has RW order come in yet?  If not, follow up with Dr. Letta Pate.  use RW for more symmetry and use of RUE.  Mirror therapy.  Bioness for [name] (hamstring/ant tib).  Consider trying tall kneel/half kneel positions for hamstring/glut activation and improved [name] weightshifting; bridges.  Seated trunk weight shifting to R.  Continue NMR and pre-gait training with focus on weight shift to R, hamstring activation, terminal hip extension.  Exercises with Bioness/standing rotation/reaching at counter top-increased use of RUE   Note - Santiago Glad wishes for pt to use RW for more symmetrical gait pattern if pt able to tolerate due to c/o Rt knee pain   Consulted and Agree with Plan of Care  Patient;Family member/caregiver    Family Member Consulted  friend - Dena       Patient will benefit from skilled therapeutic intervention in order to improve the following deficits and impairments:  Abnormal gait, Decreased activity tolerance, Decreased balance, Decreased endurance, Decreased coordination, Decreased cognition, Decreased knowledge of use of DME, Decreased mobility, Decreased safety awareness, Difficulty walking, Decreased strength, Increased edema, Impaired perceived functional ability, Impaired UE functional use  Visit Diagnosis: Other abnormalities of gait and mobility  Other symptoms and signs involving cognitive functions following cerebral infarction  Hemiplegia and hemiparesis following cerebral infarction affecting right dominant side (HCC)  Unsteadiness on feet     Problem List Patient Active Problem List   Diagnosis Date Noted  . Slow transit constipation   . Benign prostatic hyperplasia with urinary retention   . Right hemiparesis ([place])   . Chronic right shoulder pain   . Primary osteoarthritis of right shoulder   . Left basal ganglia embolic stroke ([facility]) 70/78/6754  . CVA (cerebral vascular accident) ([place])  08/31/2018  . Elevated aspartate aminotransferase level 06/16/2015  . H/O nutritional disorder 06/16/2015  . Excessive salivation 06/16/2015  . Testicular hypofunction  06/16/2015  . Heart palpitations 06/16/2015  . 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 Screening for prostate cancer 08/21/2014  . Atypical chest pain 08/10/2013  . 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 [place], [place], [address] Phone: [phone]   Fax:  [phone]  Name: Edward Glass MRN: [id number] Date of Birth: 1956-06-18

## 2018-11-15 NOTE — Therapy (Signed)
[facility] [address] [place], [place], [address] Phone: [phone]   Fax:  [phone]  Speech Language Pathology Treatment  Patient Details  Name: Edward Glass MRN: [id number] Date of Birth: 04/16/1956 Referring Provider (SLP): Alysia Penna, MD   Encounter Date: 11/15/2018  End of Session - 11/15/18 1517    Visit Number  8    Number of Visits  21    Date for SLP Re-Evaluation  12/27/18    Authorization Type  30 visits ST (20 planned, save 10?)    Authorization - Visit Number  8    Authorization - Number of Visits  30    SLP Start Time  5462   pt in restroom at 1149   SLP Stop Time   1232    SLP Time Calculation (min)  40 min    Activity Tolerance  Patient tolerated treatment well       Past Medical History:  Diagnosis Date  . Atypical chest pain 08/10/2013  . Elevated aspartate aminotransferase level 06/16/2015  . Excessive salivation 06/16/2015  . H/O nutritional disorder 06/16/2015  . 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 Screening for prostate cancer 08/21/2014  . 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 HERNIA REPAIR  2006  . left tricep surgery    . LOOP RECORDER INSERTION N/A 09/04/2018   Procedure: LOOP RECORDER INSERTION;  Surgeon: Evans Lance, MD;  Location: [facility];  Service: Cardiovascular;  Laterality: N/A;  . TEE WITHOUT CARDIOVERSION N/A 09/04/2018   Procedure: TRANSESOPHAGEAL ECHOCARDIOGRAM (TEE);  Surgeon: Jerline Pain, MD;  Location: [facility] ENDOSCOPY;  Service: Cardiovascular;  Laterality: N/A;    There were no vitals filed for this visit.  Subjective Assessment - 11/15/18 1201    Subjective  Dena gave homework to SLP    Patient is accompained by:  Family member   Dena   Currently in Pain?  No/denies            ADULT SLP TREATMENT - 11/15/18 1202      General Information   Behavior/Cognition  Alert;Cooperative;Lethargic;Distractible      Treatment Provided   Treatment provided  Cognitive-Linquistic      Cognitive-Linquistic Treatment   Treatment focused on  Cognition    Skilled Treatment  Pt denies issues with aphasic errors in reading or speaking with greater frequency since after the CVA. Pt's friend Coralyn Helling did not seem convinced of this as she commented how she cues pt when he makes errors. Pt req'd consistent cues throughout the session to take full breath when reading steps in  4-step sequences. Pt req'd min A rarely with correct sequencing of the 4 steps.       Assessment / Recommendations / Plan   Plan  Continue with current plan of care      Progression Toward Goals   Progression toward goals  Progressing toward goals   awareness hindering progress(?)      SLP Education - 11/15/18 1517    Education Details  aphasic errors    Person(s) Educated  Patient;Other (comment)    Methods  Explanation;Verbal cues    Comprehension  Need further instruction;Verbal cues required       SLP Short Term Goals - 11/15/18 1520      SLP SHORT TERM GOAL #1   Title  Pt will undergo naming assessment and/or cognitive linguistic assessment PRN  Baseline  BNT: 10-24-18, [facility] 11-08-18    Status  Achieved      SLP SHORT TERM GOAL #2   Title  pt will demo improved conversational volume in sentence responses- average mid-upper 60s dB, over two sessions    Time  3    Period  Weeks    Status  On-going      SLP SHORT TERM GOAL #3   Title  pt will demo improved conversational volume in 5 mintues simple conversation with average mid-upper 60s dB, over three sessions    Time  3    Period  Weeks    Status  On-going      SLP SHORT TERM GOAL #4   Title  pt will demo abdominal breathing 60% of the time at rest over two sessions    Time  2    Period  Weeks    Status  Revised   from 'in 5 minutes conversation over three sessions"     SLP SHORT TERM GOAL #5   Title   pt will name 3 non-physical deficits over three sessions    Time  2    Period  Weeks    Status  On-going      SLP SHORT TERM GOAL #6   Title  pt/family will provide 3 overt s/s aspiration PNA with modified independence over 2 sessions    Time  1    Period  Weeks    Status  On-going      SLP SHORT TERM GOAL #7   Title  pt will complete simple to mod complex naming tasks pertinent to pt's interests and life experience/s 85% over three sessions    Time  2    Period  Weeks    Status  On-going       SLP Long Term Goals - 11/15/18 1520      SLP LONG TERM GOAL #1   Title  pt will demo improved conversational volume in 8 minutes simple conversation average upper 60s-low 70s dB, over three sessions    Time  7    Period  Weeks   or 21 total visits, for all LTGs   Status  On-going      SLP LONG TERM GOAL #2   Title  pt will demo abdominal breathing 70% of the time in 5 minutes conversation over three sessions    Time  7    Period  Weeks    Status  On-going      SLP LONG TERM GOAL #3   Title  pt will complete a functional, simple organizational task for 25 minutes demonstrating adequate selelctive attention with modified indpendence, over three sessions    Time  8    Period  Weeks    Status  On-going      SLP LONG TERM GOAL #4   Title  pt will double check answers with speech therapy tasks (anticipatory awareness) with nonverbal cues over three sessions    Time  7    Status  On-going      SLP LONG TERM GOAL #5   Title  SLP will monitor pt to foster cont'd pulmonary health    Time  7    Period  Weeks    Status  On-going      SLP LONG TERM GOAL #6   Title  pt will demo functional word finding skills in 8 minutes simple conversation for WNL conversational fluidity over 3 sessions    Time  7    Period  Weeks    Status  On-going      SLP LONG TERM GOAL #7   Title  pt will demo compensations for memory in 80% of opportunities    Time  7    Period  Weeks    Status  On-going        Plan - 11/15/18 1518    Clinical Impression Statement  Moderate dysarthria persists. SLP targeted pt's cognitive deficits today; attention and working memory deficits seen today especially after approx 7-8 minutes of focused mental work. Pt demonstrated aphasic reading errors; denied more frequent errors since CVA. skilled ST to maximize intelligiblity, language, and cognitive commuication skills.Marland Kitchen    Speech Therapy Frequency  2x / week    Duration  --   10 weeks or 21 visits   Treatment/Interventions  Oral motor exercises;Cueing hierarchy;Environmental controls;Compensatory techniques;Cognitive reorganization;Functional tasks;SLP instruction and feedback;Patient/family education;Internal/external aids;Aspiration precaution training;Pharyngeal strengthening exercises;Diet toleration management by SLP;Trials of upgraded texture/liquids    Potential to Achieve Goals  Good    Potential Considerations  Ability to learn/carryover information;Severity of impairments    SLP Home Exercise Plan  [place]; PHoRTE    Consulted and Agree with Plan of Care  Patient       Patient will benefit from skilled therapeutic intervention in order to improve the following deficits and impairments:   Cognitive communication deficit  Dysarthria and anarthria  Aphasia  Dysphagia, oropharyngeal phase    Problem List Patient Active Problem List   Diagnosis Date Noted  . Slow transit constipation   . Benign prostatic hyperplasia with urinary retention   . Right hemiparesis ([place])   . Chronic right shoulder pain   . Primary osteoarthritis of right shoulder   . Left basal ganglia embolic stroke ([facility]) 45/80/9983  . CVA (cerebral vascular accident) ([place]) 08/31/2018  . Elevated aspartate aminotransferase level 06/16/2015  . H/O nutritional disorder 06/16/2015  . Excessive salivation 06/16/2015  . 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 Heart palpitations 06/16/2015  . 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 Screening for prostate cancer 08/21/2014  . Atypical chest pain 08/10/2013  . 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 [place], [place], [address] Phone: [phone]   Fax:  [phone]   Name: Edward Glass MRN: [id number] Date of Birth: [birth year]

## 2018-11-16 NOTE — Progress Notes (Signed)
[facility] Summary Report / Loop Recorder 

## 2018-11-17 ENCOUNTER — Ambulatory Visit: Payer: BLUE CROSS/BLUE SHIELD | Admitting: Physical Therapy

## 2018-11-17 ENCOUNTER — Ambulatory Visit: Payer: BLUE CROSS/BLUE SHIELD | Admitting: Occupational Therapy

## 2018-11-17 ENCOUNTER — Ambulatory Visit: Payer: BLUE CROSS/BLUE SHIELD

## 2018-11-17 ENCOUNTER — Ambulatory Visit: Payer: BLUE CROSS/BLUE SHIELD | Admitting: Rehabilitative and Restorative Service Providers"

## 2018-11-17 ENCOUNTER — Encounter: Payer: BLUE CROSS/BLUE SHIELD | Admitting: Occupational Therapy

## 2018-11-20 NOTE — Telephone Encounter (Addendum)
-----   Message from [name], MD sent at 11/20/2018  2:38 PM EDT ----- Regarding: FW: Follow up on request for RW order Please send order for rolling walker

## 2018-11-20 NOTE — Telephone Encounter (Signed)
Order for rolling walker placed.

## 2018-11-21 ENCOUNTER — Ambulatory Visit: Payer: BLUE CROSS/BLUE SHIELD | Admitting: Physical Therapy

## 2018-11-21 ENCOUNTER — Encounter: Payer: BLUE CROSS/BLUE SHIELD | Admitting: Occupational Therapy

## 2018-11-22 ENCOUNTER — Telehealth: Payer: Self-pay | Admitting: Adult Health

## 2018-11-22 ENCOUNTER — Other Ambulatory Visit: Payer: Self-pay

## 2018-11-22 ENCOUNTER — Ambulatory Visit: Payer: BLUE CROSS/BLUE SHIELD | Admitting: Physical Therapy

## 2018-11-22 ENCOUNTER — Encounter: Payer: Self-pay | Admitting: Physical Therapy

## 2018-11-22 ENCOUNTER — Ambulatory Visit: Payer: BLUE CROSS/BLUE SHIELD

## 2018-11-22 ENCOUNTER — Ambulatory Visit: Payer: BLUE CROSS/BLUE SHIELD | Admitting: Occupational Therapy

## 2018-11-22 DIAGNOSIS — I69351 Hemiplegia and hemiparesis following cerebral infarction affecting right dominant side: Secondary | ICD-10-CM

## 2018-11-22 DIAGNOSIS — M6281 Muscle weakness (generalized): Secondary | ICD-10-CM

## 2018-11-22 DIAGNOSIS — R1312 Dysphagia, oropharyngeal phase: Secondary | ICD-10-CM

## 2018-11-22 DIAGNOSIS — R2689 Other abnormalities of gait and mobility: Secondary | ICD-10-CM

## 2018-11-22 DIAGNOSIS — R471 Dysarthria and anarthria: Secondary | ICD-10-CM

## 2018-11-22 DIAGNOSIS — R29818 Other symptoms and signs involving the nervous system: Secondary | ICD-10-CM

## 2018-11-22 DIAGNOSIS — R2681 Unsteadiness on feet: Secondary | ICD-10-CM

## 2018-11-22 DIAGNOSIS — R4701 Aphasia: Secondary | ICD-10-CM

## 2018-11-22 DIAGNOSIS — R41841 Cognitive communication deficit: Secondary | ICD-10-CM

## 2018-11-22 DIAGNOSIS — R293 Abnormal posture: Secondary | ICD-10-CM

## 2018-11-22 NOTE — Therapy (Signed)
[facility] [address] [place], [place], [address] Phone: [phone]   Fax:  [phone]  Physical Therapy Treatment  Patient Details  Name: Edward Glass MRN: [id number] Date of Birth: 04-29-1956 Referring Provider (PT): Alysia Penna MD   Encounter Date: 11/22/2018  PT End of Session - 11/22/18 1616    Visit Number  12    Number of Visits  17    Date for PT Re-Evaluation  12/02/18    Authorization Type  BCBS; met $7500 deductible; VL: PT/OT 30, zero used; seen on same day =1 visit; SLP 30 visits    Authorization Time Period  VL IS A HARD MAX    Authorization - Visit Number  13   PT/OT combined   Authorization - Number of Visits  30    PT Start Time  3154    PT Stop Time  1328    PT Time Calculation (min)  49 min    Activity Tolerance  Patient tolerated treatment well    Behavior During Therapy  [facility] for tasks assessed/performed       Past Medical History:  Diagnosis Date  . Atypical chest pain 08/10/2013  . Elevated aspartate aminotransferase level 06/16/2015  . Excessive salivation 06/16/2015  . H/O nutritional disorder 06/16/2015  . 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 Screening for prostate cancer 08/21/2014  . 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 HERNIA REPAIR  2006  . left tricep surgery    . LOOP RECORDER INSERTION N/A 09/04/2018   Procedure: LOOP RECORDER INSERTION;  Surgeon: Evans Lance, MD;  Location: [facility];  Service: Cardiovascular;  Laterality: N/A;  . TEE WITHOUT CARDIOVERSION N/A 09/04/2018   Procedure: TRANSESOPHAGEAL ECHOCARDIOGRAM (TEE);  Surgeon: Jerline Pain, MD;  Location: [facility] ENDOSCOPY;  Service: Cardiovascular;  Laterality: N/A;    There were no vitals filed for this visit.  Subjective Assessment - 11/22/18 1248    Subjective  Doing well; no knee pain.  Pt isn't sure if it is the knee  brace that is helping or not.  Provided pt and fiance with order for RW.    Patient is accompained by:  --   Denman George - Dena   Pertinent History  chronic rt shoulder pain; had recent fall with rt wrist pain, xray negative for fracture and had steroid injection 09/27/18    Limitations  Walking    Diagnostic tests  MRI-Multiple small foci of acute/early subacute infarctions present in the left MCA distribution concentrated in left basal ganglia; Small chronic infarcts in right parietal and occipital lobes    Patient Stated Goals  improve his walking and balance, be able to live on his own again in his 2 story house    Pain Onset  More than a month ago                       [facility] Adult PT Treatment/Exercise - 11/22/18 1250      Ambulation/Gait   Ambulation/Gait  Yes    Ambulation/Gait Assistance  4: Min assist    Ambulation/Gait Assistance Details  with bilat UE support on trekking poles for increased attention to and extensor activation through RUE and to improve upright posture, trunk rotation and arm swing.  Cues from therapist for sequencing and for continued facilitation of full anterior/lateral weight shifting and stance time on [name] to allow for full  step length LLE.  No genu recurvatum noted and no R knee pain reported    Ambulation Distance (Feet)  20 Feet    Assistive device  Other (Comment)   bilat trekking poles   Gait Pattern  Step-to pattern;Step-through pattern;Decreased step length - left;Decreased stance time - right;Decreased weight shift to right    Ambulation Surface  Level;Indoor      Therapeutic Activites    Therapeutic Activities  Other Therapeutic Activities    Other Therapeutic Activities  Provided pt and fiance with RW order from physician; discussed with fiance process for obtaining RW from [place] with order.        Neuro Re-ed    Neuro Re-ed Details   Quadruped on mat attempting to weight shift and WB through RUE and [name] but pt unable to motor plan  how to lift LUE and reported significant fatigue and cramping in LUE.  Transitioned to tall kneeling on mat performing R/L lateral weight shifting from pelvis, diagonal weight shifting with mini diagonal squats and combining weight shifting with alternating LE hip flexion <> extension and hip ABD/ADD x 10 reps with UE support on bench and therapist providing cues to maintain upright trunk and to activate at proximal hip.  Returned to sitting due to pain in L knee.  No pain reported in R knee.               PT Short Term Goals - 11/06/18 1016      PT SHORT TERM GOAL #1   Title  Patient will be independent with HEP (Target all STGs 4 weeks 11/02/2018)    Baseline  met 11-06-18 per pt report    Status  Achieved      PT SHORT TERM GOAL #5   Title  Patient will ambulate on ramp and up/down curb with close-guarding and LRAD    Baseline  pt requires min assist with curb & ramp negotiation with [facility] for safety - 11-06-18    Time  4    Period  Weeks    Status  Not Met        PT Long Term Goals - 10/04/18 0735      PT LONG TERM GOAL #1   Title  Patient will be independent with updated HEP and verbalize plan for continued community-based exercise plan (Target all LTGs 8 weeks, 12/02/2018)      PT LONG TERM GOAL #2   Title  Patient will improve gait velocity to 1.30 ft/sec demonstrating progress towards decr fall risk      PT LONG TERM GOAL #3   Title  Patient will improve 5x sit to stand to <12 seconds demonstrating decr fall risk      PT LONG TERM GOAL #4   Title  Patient will ambulate over outdoor paved surfaces modified independent with LRAD, including up/down curb.       PT LONG TERM GOAL #5   Title  Patient will ambulate >300 ft over level indoor surface in <= 6 minutes (6MWT)      Additional Long Term Goals   Additional Long Term Goals  Yes      PT LONG TERM GOAL #6   Title  Patient will go up/down 12 steps with single rail modified independent             Plan -  11/22/18 1616    Clinical Impression Statement  Patient and fiance to take order for RW to DME provider today and will  hopefully have a RW at next visit.  Continued to incorporate WB through RUE in quadruped, tall kneeling and gait training to increase overall attention and activation of R side, weight shifting and postural control during transitions and gait.  Pt demonstrated improved motor planning and activation of weight shifting at pelvis.  Pt was limited during quadruped and tall kneeling due to pain in L elbow and L knee, no pain on R side.  Will likely need to modify and work up to more WB positions.     Personal Factors and Comorbidities  Comorbidity 1    Comorbidities  chronic rt shoulder pain    Examination-Activity Limitations  Bathing;Bed Mobility;Caring for Others;Carry;Dressing;Locomotion Level;Lift;Reach Overhead;Self Feeding;Stairs    Examination-Participation Restrictions  Community Activity;Driving    Rehab Potential  Good    PT Frequency  2x / week    PT Duration  8 weeks    PT Treatment/Interventions  ADLs/Self Care Home Management;Aquatic Therapy;Electrical Stimulation;DME Instruction;Gait training;Stair training;Functional mobility training;Therapeutic activities;Therapeutic exercise;Balance training;Orthotic Fit/Training;Patient/family education;Neuromuscular re-education;Wheelchair mobility training;Passive range of motion;Visual/perceptual remediation/compensation    PT Next Visit Plan  LTG due by 5/9.  were they able to get RW?  use RW or trekking poles during therapy for more symmetry and use of RUE.  Mirror therapy.  Bioness for [name] (hamstring/ant tib).  Consider trying tall kneel/half kneel positions for hamstring/glut activation and improved [name] weightshifting; bridges.  Seated trunk weight shifting to R.  Continue NMR and pre-gait training with focus on weight shift to R, hamstring activation, terminal hip extension.  Exercises with Bioness/standing rotation/reaching at  counter top-increased use of RUE   Note - Santiago Glad wishes for pt to use RW for more symmetrical gait pattern if pt able to tolerate due to c/o Rt knee pain   Consulted and Agree with Plan of Care  Patient;Family member/caregiver    Family Member Consulted  friend - Dena       Patient will benefit from skilled therapeutic intervention in order to improve the following deficits and impairments:  Abnormal gait, Decreased activity tolerance, Decreased balance, Decreased endurance, Decreased coordination, Decreased cognition, Decreased knowledge of use of DME, Decreased mobility, Decreased safety awareness, Difficulty walking, Decreased strength, Increased edema, Impaired perceived functional ability, Impaired UE functional use  Visit Diagnosis: Other abnormalities of gait and mobility  Other symptoms and signs involving the nervous system  Hemiplegia and hemiparesis following cerebral infarction affecting right dominant side (HCC)  Unsteadiness on feet     Problem List Patient Active Problem List   Diagnosis Date Noted  . Slow transit constipation   . Benign prostatic hyperplasia with urinary retention   . Right hemiparesis ([place])   . Chronic right shoulder pain   . Primary osteoarthritis of right shoulder   . Left basal ganglia embolic stroke ([place]) 86/76/1950  . CVA (cerebral vascular accident) ([place]) 08/31/2018  . Elevated aspartate aminotransferase level 06/16/2015  . H/O nutritional disorder 06/16/2015  . Excessive salivation 06/16/2015  . 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 Heart palpitations 06/16/2015  . 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 Screening for prostate cancer 08/21/2014  . Atypical chest pain 08/10/2013  . 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

## 2018-11-22 NOTE — Telephone Encounter (Signed)
2 VM left for pt to call back and schedule a f/u

## 2018-11-22 NOTE — Therapy (Signed)
[facility] [address] [place], [place], [address] Phone: [phone]   Fax:  [phone]  Occupational Therapy Treatment  Patient Details  Name: Edward Glass MRN: [id number] Date of Birth: 1956-03-28 Referring Provider (OT): Dr. Alysia Penna   Encounter Date: 11/22/2018  OT End of Session - 11/22/18 1418    Visit Number  12    Number of Visits  24    Date for OT Re-Evaluation  12/28/18    Authorization Type  BCBS - has 30 visits between OT and PT however if visits occur on same day counts as 1 visit. Discussion with pt re: use of remaining visits at [facility] - Visit Number  12    Authorization - Number of Visits  24    OT Start Time  1017    OT Stop Time  1230    OT Time Calculation (min)  38 min    Activity Tolerance  Patient tolerated treatment well    Behavior During Therapy  [facility] for tasks assessed/performed       Past Medical History:  Diagnosis Date  . Atypical chest pain 08/10/2013  . Elevated aspartate aminotransferase level 06/16/2015  . Excessive salivation 06/16/2015  . H/O nutritional disorder 06/16/2015  . 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 Screening for prostate cancer 08/21/2014  . 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 HERNIA REPAIR  2006  . left tricep surgery    . LOOP RECORDER INSERTION N/A 09/04/2018   Procedure: LOOP RECORDER INSERTION;  Surgeon: Evans Lance, MD;  Location: [facility];  Service: Cardiovascular;  Laterality: N/A;  . TEE WITHOUT CARDIOVERSION N/A 09/04/2018   Procedure: TRANSESOPHAGEAL ECHOCARDIOGRAM (TEE);  Surgeon: Jerline Pain, MD;  Location: [facility] ENDOSCOPY;  Service: Cardiovascular;  Laterality: N/A;    There were no vitals filed for this visit.  Subjective Assessment - 11/22/18 1417    Pertinent History  L CVA on 09/14/18  Loop recorder, pt taking blood  thinner    Patient Stated Goals  walk better and use my hand    Currently in Pain?  Yes    Pain Score  3     Pain Location  Shoulder    Pain Orientation  Right    Pain Descriptors / Indicators  Aching    Pain Type  Neuropathic pain    Pain Onset  More than a month ago    Pain Frequency  Intermittent    Aggravating Factors   alpsostioning    Pain Relieving Factors  repositioning          Treatment:.Pt was treated in the clinic today with universal masking by staff and reduced number of patients in the clinic due to COVID-19.  supine gentle joint mobs to right shoulder followed by lower trunk rotation and RUE shoulder abduction, min facilitation followed by rolling on and off right side for spasticity management, min facilitation v.c followed by gentle AA/ROM shoulder flexion. Weightbearing through right elbow and hand with body on arm movements lateral trunk flexion, rotation  and weight shifts,min- mod facilitation. Standing while weightbearing though bilateral UE's rocking forwards and backwards and performing modified cat/ cow positions for weightbearing and trunk mobilization, min facilitation/ v.c. Low range functional reaching in standing to grasp/ relase cylindrical objects and stack, min-mod facilitation/ v.c for hand function and weightshifting through [name].  OT Short Term Goals - 11/15/18 1039      OT SHORT TERM GOAL #1   Title  Pt and family will be mod I  with RUE positioning/care to address edema and pain in RUE - 11/23/2018 (goal adjusted as pt missed one week)    Status  Achieved      OT SHORT TERM GOAL #2   Title  Pt and family will be mod I with splint wear and care    Status  Achieved      OT SHORT TERM GOAL #3   Title  Pt and dtr will be mod I with HEP for RUE for ROM, addressing pain, functional use of RUE prn    Status  On-going      OT SHORT TERM GOAL #4   Title  Pt will report pain in R hand/wrist no greater than 5/10  during HEP.      Status  Achieved      OT SHORT TERM GOAL #5   Title  Pt will demonstrate the ability for 55* of shoulder flexion as prep for functional low reach      OT SHORT TERM GOAL #6   Title  Pt will be mod I with toilet and tub bench transfers    Status  On-going   supervision-min a for leg     OT SHORT TERM GOAL #7   Title  Pt will be mod I with bathing at shower level    Status  On-going   supervision       OT Long Term Goals - 11/13/18 1415      OT LONG TERM GOAL #1   Title  Pt and family will be mod I with upgraded home activities program - 01/05/2019 (goal adjusted as pt missed one week)    Status  On-going      OT LONG TERM GOAL #2   Title  Pt will demonstrate ability to grasp and hold light weight object in R hand with pain 2/10 or less    Status  On-going      OT LONG TERM GOAL #3   Title  Pt will demonstrate ability for bilateral low reach to obtain light weigh object     Status  On-going      OT LONG TERM GOAL #4   Title  Pt will be mod I with dressing    Status  On-going      OT LONG TERM GOAL #5   Title  Pt will be mod I with cutting food, AE prn    Status  On-going      OT LONG TERM GOAL #6   Title  Pt will demonstrate ability to use RUE as stabilizer at least 75% of the time in simple ADL tasks    Status  On-going      OT LONG TERM GOAL #7   Title  Pt will be mod I with simple snack/be prep at ambulatory level    Status  On-going      OT LONG TERM GOAL #8   Title  Pt will demonstrate improved postural alignment to decrease pain in RUE as well as decrease risk of falls    Status  On-going            Plan - 11/22/18 1419    Clinical Impression Statement  Pt is progressing towards goals. He demonstrates improving trunk contol and low range RUE functional reach.    Occupational performance deficits (Please refer  to evaluation for details):  ADL's;IADL's;Rest and Sleep;Work;Leisure;Social Participation    Body Structure / Function /  Physical Skills  ADL;Balance;Decreased knowledge of use of DME;Coordination;Edema;GMC;FMC;Flexibility;IADL;Mobility;Pain;ROM;Strength;Tone;UE functional use;Endurance    Cognitive Skills  Safety Awareness    Rehab Potential  Good    OT Frequency  2x / week    OT Duration  12 weeks    OT Treatment/Interventions  Self-care/ADL training;Aquatic Therapy;Cryotherapy;Therapeutic exercise;Neuromuscular education;Energy conservation;DME and/or AE instruction;Functional Mobility Training;Cognitive remediation/compensation;Balance training;Splinting;Manual Therapy;Passive range of motion;Therapeutic activities;Patient/family education    Plan  continue to check STG's-add to HEP, continue NMR RUE/trunk/functional mobility     Consulted and Agree with Plan of Care  Patient    Family Member Consulted  girlfriend       Patient will benefit from skilled therapeutic intervention in order to improve the following deficits and impairments:  Body Structure / Function / Physical Skills, Cognitive Skills  Visit Diagnosis: Hemiplegia and hemiparesis following cerebral infarction affecting right dominant side (HCC)  Muscle weakness (generalized)  Abnormal posture  Unsteadiness on feet  Other symptoms and signs involving the nervous system    Problem List Patient Active Problem List   Diagnosis Date Noted  . Slow transit constipation   . Benign prostatic hyperplasia with urinary retention   . Right hemiparesis ([place])   . Chronic right shoulder pain   . Primary osteoarthritis of right shoulder   . Left basal ganglia embolic stroke ([place]) 30/01/6225  . CVA (cerebral vascular accident) ([place]) 08/31/2018  . Elevated aspartate aminotransferase level 06/16/2015  . H/O nutritional disorder 06/16/2015  . Excessive salivation 06/16/2015  . 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 Heart palpitations 06/16/2015  . 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 Screening for prostate cancer  08/21/2014  . Atypical chest pain 08/10/2013  . 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 [place], [place], [address] Phone: [phone]   Fax:  [phone]  Name: Edward Glass MRN: [id number] Date of Birth: Oct 16, 1955

## 2018-11-23 NOTE — Therapy (Signed)
[facility] [address] [place], [place], [address] Phone: [phone]   Fax:  [phone]  Speech Language Pathology Treatment  Patient Details  Name: Edward Glass MRN: [id number] Date of Birth: 18-Jun-1956 Referring Provider (SLP): Alysia Penna, MD   Encounter Date: 11/22/2018  End of Session - 11/23/18 2329    Visit Number  9    Number of Visits  21    Date for SLP Re-Evaluation  12/27/18    Authorization Type  30 visits ST (20 planned, save 10?)    Authorization - Visit Number  9    Authorization - Number of Visits  30    SLP Start Time  1106    SLP Stop Time   1146    SLP Time Calculation (min)  40 min    Activity Tolerance  Patient tolerated treatment well       Past Medical History:  Diagnosis Date  . Atypical chest pain 08/10/2013  . Elevated aspartate aminotransferase level 06/16/2015  . Excessive salivation 06/16/2015  . H/O nutritional disorder 06/16/2015  . 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 Screening for prostate cancer 08/21/2014  . 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 HERNIA REPAIR  2006  . left tricep surgery    . LOOP RECORDER INSERTION N/A 09/04/2018   Procedure: LOOP RECORDER INSERTION;  Surgeon: Evans Lance, MD;  Location: [facility];  Service: Cardiovascular;  Laterality: N/A;  . TEE WITHOUT CARDIOVERSION N/A 09/04/2018   Procedure: TRANSESOPHAGEAL ECHOCARDIOGRAM (TEE);  Surgeon: Jerline Pain, MD;  Location: [facility];  Service: Cardiovascular;  Laterality: N/A;    There were no vitals filed for this visit.  Subjective Assessment - 11/23/18 2324    Subjective  (Dena) "I didn't know we needed to bring (the homework) back."    Patient is accompained by:  Family member   dena           ADULT SLP TREATMENT - 11/23/18 0001      General Information   Behavior/Cognition   Alert;Cooperative   flat affect     Treatment Provided   Treatment provided  Cognitive-Linquistic      Cognitive-Linquistic Treatment   Treatment focused on  Cognition    Skilled Treatment  (Cognition 25 minutes)Working memory/attention task with spelling 6-7 letter words and providing coin combinations for pt. 100% success with spelling with no extra time; extra time consisitenly necessary and occasional min-mod A with coins. SLP suggested pt use compensation of writing down combinations; pt wrote incorrect numbers at times - may be aphasia related. SLP req'd to provide mod cues occasionally with calendar tasks requiring attention (sustained) Dena stated, during this task, "He was in the hospital in February" as pt had considerable difficulty with attention/working memory telling SLP what was two months ago. (speech tx 10 minutes)  Pt spontaneously produced single words and 2-3 word phrases with louder voice in structured tasks 75% success.       Assessment / Recommendations / Plan   Plan  Continue with current plan of care      Progression Toward Goals   Progression toward goals  Not progressing toward goals (comment)   severity of deficits      SLP Education - 11/23/18 2329    Education Details  attention deficits, home tasks, compensations for attention    Person(s) Educated  Patient;Other (comment)    Methods  Explanation;Demonstration    Comprehension  Verbalized understanding;Need further instruction       SLP Short Term Goals - 11/23/18 2332      SLP SHORT TERM GOAL #1   Title  Pt will undergo naming assessment and/or cognitive linguistic assessment PRN    Baseline  BNT: 10-24-18, MoCA 11-08-18    Status  Achieved      SLP SHORT TERM GOAL #2   Title  pt will demo improved conversational volume in sentence responses- average mid-upper 60s dB, over two sessions    Time  2    Period  Weeks    Status  On-going      SLP SHORT TERM GOAL #3   Title  pt will demo improved  conversational volume in 5 mintues simple conversation with average mid-upper 60s dB, over three sessions    Time  2    Period  Weeks    Status  On-going      SLP SHORT TERM GOAL #4   Title  pt will demo abdominal breathing 60% of the time at rest over two sessions    Time  1    Period  Weeks    Status  Revised   from 'in 5 minutes conversation over three sessions"     SLP SHORT TERM GOAL #5   Title  pt will name 3 non-physical deficits over three sessions    Time  1    Period  Weeks    Status  On-going      SLP SHORT TERM GOAL #6   Title  pt/family will provide 3 overt s/s aspiration PNA with modified independence over 2 sessions    Time  1    Period  Weeks    Status  On-going      SLP SHORT TERM GOAL #7   Title  pt will complete simple to mod complex naming tasks pertinent to pt's interests and life experience/s 85% over three sessions    Time  2    Period  Weeks    Status  On-going       SLP Long Term Goals - 11/23/18 2332      SLP LONG TERM GOAL #1   Title  pt will demo improved conversational volume in 8 minutes simple conversation average upper 60s-low 70s dB, over three sessions    Time  6    Period  Weeks   or 21 total visits, for all LTGs   Status  On-going      SLP LONG TERM GOAL #2   Title  pt will demo abdominal breathing 70% of the time in 5 minutes conversation over three sessions    Time  6    Period  Weeks    Status  On-going      SLP LONG TERM GOAL #3   Title  pt will complete a functional, simple organizational task for 25 minutes demonstrating adequate selelctive attention with modified indpendence, over three sessions    Time  7    Period  Weeks    Status  On-going      SLP LONG TERM GOAL #4   Title  pt will double check answers with speech therapy tasks (anticipatory awareness) with nonverbal cues over three sessions    Time  6    Status  On-going      SLP LONG TERM GOAL #5   Title  SLP will monitor pt to foster cont'd pulmonary health     Time  6    Period  Weeks    Status  On-going      SLP LONG TERM GOAL #6   Title  pt will demo functional word finding skills in 8 minutes simple conversation for WNL conversational fluidity over 3 sessions    Time  6    Period  Weeks    Status  On-going      SLP LONG TERM GOAL #7   Title  pt will demo compensations for memory in 80% of opportunities    Time  6    Period  Weeks    Status  On-going       Plan - 11/23/18 2330    Clinical Impression Statement  Moderate dysarthria persists. SLP cont'd to target pt's cognitive deficits today; attention and working memory deficits again seen today. Pt's friend appears to cont to "explain away" pt's deficits. Pt would benefit from skilled ST to maximize intelligiblity, language, and cognitive commuication skills.Marland Kitchen    Speech Therapy Frequency  2x / week    Duration  --   10 weeks or 21 visits   Treatment/Interventions  Oral motor exercises;Cueing hierarchy;Environmental controls;Compensatory techniques;Cognitive reorganization;Functional tasks;SLP instruction and feedback;Patient/family education;Internal/external aids;Aspiration precaution training;Pharyngeal strengthening exercises;Diet toleration management by SLP;Trials of upgraded texture/liquids    Potential to Achieve Goals  Good    Potential Considerations  Ability to learn/carryover information;Severity of impairments    SLP Home Exercise Plan  L[employer]; PHoRTE    Consulted and Agree with Plan of Care  Patient       Patient will benefit from skilled therapeutic intervention in order to improve the following deficits and impairments:   Cognitive communication deficit  Dysarthria and anarthria  Aphasia  Dysphagia, oropharyngeal phase    Problem List Patient Active Problem List   Diagnosis Date Noted  . Slow transit constipation   . Benign prostatic hyperplasia with urinary retention   . Right hemiparesis ([facility])   . Chronic right shoulder pain   . Primary  osteoarthritis of right shoulder   . Left basal ganglia embolic stroke ([facility]) 01/00/7121  . CVA (cerebral vascular accident) ([facility]) 08/31/2018  . Elevated aspartate aminotransferase level 06/16/2015  . H/O nutritional disorder 06/16/2015  . Excessive salivation 06/16/2015  . 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 Heart palpitations 06/16/2015  . 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 Screening for prostate cancer 08/21/2014  . Atypical chest pain 08/10/2013  . 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 [place], [place], [address] Phone: [phone]   Fax:  [phone]   Name: Edward Glass MRN: [id number] Date of Birth: December 27, 1955

## 2018-11-23 NOTE — Patient Instructions (Signed)
  Please complete the assigned speech therapy homework prior to your next session and return it to the speech therapist at your next visit.  

## 2018-11-24 ENCOUNTER — Ambulatory Visit: Payer: BC Managed Care – PPO | Admitting: Rehabilitative and Restorative Service Providers"

## 2018-11-24 ENCOUNTER — Ambulatory Visit: Payer: BLUE CROSS/BLUE SHIELD | Admitting: Physical Therapy

## 2018-11-24 ENCOUNTER — Ambulatory Visit: Payer: BC Managed Care – PPO | Admitting: Occupational Therapy

## 2018-11-24 ENCOUNTER — Other Ambulatory Visit: Payer: Self-pay

## 2018-11-24 ENCOUNTER — Encounter: Payer: BLUE CROSS/BLUE SHIELD | Admitting: Occupational Therapy

## 2018-11-24 ENCOUNTER — Ambulatory Visit: Payer: BC Managed Care – PPO | Attending: Physician Assistant

## 2018-11-24 ENCOUNTER — Encounter: Payer: Self-pay | Admitting: Rehabilitative and Restorative Service Providers"

## 2018-11-24 DIAGNOSIS — R4701 Aphasia: Secondary | ICD-10-CM | POA: Insufficient documentation

## 2018-11-24 DIAGNOSIS — R2681 Unsteadiness on feet: Secondary | ICD-10-CM | POA: Insufficient documentation

## 2018-11-24 DIAGNOSIS — R2689 Other abnormalities of gait and mobility: Secondary | ICD-10-CM | POA: Diagnosis present

## 2018-11-24 DIAGNOSIS — M25531 Pain in right wrist: Secondary | ICD-10-CM | POA: Insufficient documentation

## 2018-11-24 DIAGNOSIS — G8929 Other chronic pain: Secondary | ICD-10-CM | POA: Diagnosis present

## 2018-11-24 DIAGNOSIS — R29818 Other symptoms and signs involving the nervous system: Secondary | ICD-10-CM

## 2018-11-24 DIAGNOSIS — M6281 Muscle weakness (generalized): Secondary | ICD-10-CM

## 2018-11-24 DIAGNOSIS — I69318 Other symptoms and signs involving cognitive functions following cerebral infarction: Secondary | ICD-10-CM | POA: Insufficient documentation

## 2018-11-24 DIAGNOSIS — M25511 Pain in right shoulder: Secondary | ICD-10-CM | POA: Diagnosis present

## 2018-11-24 DIAGNOSIS — I69351 Hemiplegia and hemiparesis following cerebral infarction affecting right dominant side: Secondary | ICD-10-CM

## 2018-11-24 DIAGNOSIS — R41841 Cognitive communication deficit: Secondary | ICD-10-CM | POA: Insufficient documentation

## 2018-11-24 DIAGNOSIS — R1312 Dysphagia, oropharyngeal phase: Secondary | ICD-10-CM | POA: Diagnosis present

## 2018-11-24 DIAGNOSIS — R293 Abnormal posture: Secondary | ICD-10-CM | POA: Diagnosis present

## 2018-11-24 DIAGNOSIS — R600 Localized edema: Secondary | ICD-10-CM | POA: Diagnosis present

## 2018-11-24 DIAGNOSIS — R471 Dysarthria and anarthria: Secondary | ICD-10-CM | POA: Diagnosis present

## 2018-11-24 DIAGNOSIS — M79641 Pain in right hand: Secondary | ICD-10-CM | POA: Insufficient documentation

## 2018-11-24 NOTE — Therapy (Signed)
[facility] [address] [place], [place], [address] Phone: [phone]   Fax:  [phone]  Occupational Therapy Treatment  Patient Details  Name: Edward Glass MRN: [id number] Date of Birth: 07-18-1956 Referring Provider (OT): Dr. Alysia Penna   Encounter Date: 11/24/2018  OT End of Session - 11/24/18 1711    Visit Number  13    Number of Visits  24    Date for OT Re-Evaluation  12/28/18    Authorization Type  [employer] - has 30 visits between OT and PT however if visits occur on same day counts as 1 visit. Discussion with pt re: use of remaining visits at [facility] - Visit Number  13    Authorization - Number of Visits  24    OT Start Time  1150    OT Stop Time  1230    OT Time Calculation (min)  40 min    Activity Tolerance  Patient tolerated treatment well    Behavior During Therapy  WFL for tasks assessed/performed       Past Medical History:  Diagnosis Date  . Atypical chest pain 08/10/2013  . Elevated aspartate aminotransferase level 06/16/2015  . Excessive salivation 06/16/2015  . H/O nutritional disorder 06/16/2015  . 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 Screening for prostate cancer 08/21/2014  . 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 HERNIA REPAIR  2006  . left tricep surgery    . LOOP RECORDER INSERTION N/A 09/04/2018   Procedure: LOOP RECORDER INSERTION;  Surgeon: Evans Lance, MD;  Location: [facility];  Service: Cardiovascular;  Laterality: N/A;  . TEE WITHOUT CARDIOVERSION N/A 09/04/2018   Procedure: TRANSESOPHAGEAL ECHOCARDIOGRAM (TEE);  Surgeon: Jerline Pain, MD;  Location: [facility] ENDOSCOPY;  Service: Cardiovascular;  Laterality: N/A;    There were no vitals filed for this visit.  Subjective Assessment - 11/24/18 1710    Pertinent History  L CVA on 09/14/18  Loop recorder, pt taking blood  thinner    Currently in Pain?  Yes    Pain Score  3     Pain Location  Shoulder    Pain Orientation  Right    Pain Descriptors / Indicators  Aching    Pain Type  Neuropathic pain    Pain Onset  More than a month ago    Pain Frequency  Intermittent    Aggravating Factors   walker use    Pain Relieving Factors  rest             Treatment:.Pt was treated in the clinic today with universal masking by staff and reduced number of patients in the clinic due to COVID-19.  supine gentle joint mobs to right shoulder followed by lower trunk rotation and RUE shoulder abduction, min facilitation followed by rolling on and off right side for spasticity management, min facilitation v.c followed by gentle AA/ROM shoulder flexion. Seated low range closed chain reach towards feet while holding medium ball, followed by unilateral supported low range shoulder flexion, min facilitation Weightbearing through right  hand with body on arm movements lateral trunk flexion, rotation  and weight shifts,min- mod facilitation. Standing Low range functional reaching in standing to grasp/ relase cylindrical objects and stack, min-mod facilitation/ v.c for hand function and weightshifting through [name].                 OT  Short Term Goals - 11/24/18 1213      OT SHORT TERM GOAL #1   Title  Pt and family will be mod I  with RUE positioning/care to address edema and pain in RUE - 11/23/2018 (goal adjusted as pt missed one week)  (Pended)     Status  Achieved  (Pended)       OT SHORT TERM GOAL #2   Title  Pt and family will be mod I with splint wear and care  (Pended)     Status  Achieved  (Pended)       OT SHORT TERM GOAL #3   Title  Pt and dtr will be mod I with HEP for RUE for ROM, addressing pain, functional use of RUE prn  (Pended)     Status  On-going  (Pended)       OT SHORT TERM GOAL #4   Title  Pt will report pain in R hand/wrist no greater than 5/10 during HEP.    (Pended)     Status   On-going  (Pended)       OT SHORT TERM GOAL #5   Title  Pt will demonstrate the ability for 55* of shoulder flexion as prep for functional low reach  (Pended)     Status  On-going  (Pended)    45-50     OT SHORT TERM GOAL #6   Title  Pt will be mod I with toilet and tub bench transfers  (Pended)     Status  On-going  (Pended)       OT SHORT TERM GOAL #7   Title  Pt will be mod I with bathing at shower level  (Pended)     Status  On-going  (Pended)         OT Long Term Goals - 11/13/18 1415      OT LONG TERM GOAL #1   Title  Pt and family will be mod I with upgraded home activities program - 01/05/2019 (goal adjusted as pt missed one week)    Status  On-going      OT LONG TERM GOAL #2   Title  Pt will demonstrate ability to grasp and hold light weight object in R hand with pain 2/10 or less    Status  On-going      OT LONG TERM GOAL #3   Title  Pt will demonstrate ability for bilateral low reach to obtain light weigh object     Status  On-going      OT LONG TERM GOAL #4   Title  Pt will be mod I with dressing    Status  On-going      OT LONG TERM GOAL #5   Title  Pt will be mod I with cutting food, AE prn    Status  On-going      OT LONG TERM GOAL #6   Title  Pt will demonstrate ability to use RUE as stabilizer at least 75% of the time in simple ADL tasks    Status  On-going      OT LONG TERM GOAL #7   Title  Pt will be mod I with simple snack/be prep at ambulatory level    Status  On-going      OT LONG TERM GOAL #8   Title  Pt will demonstrate improved postural alignment to decrease pain in RUE as well as decrease risk of falls    Status  On-going  Plan - 11/24/18 1711    Clinical Impression Statement  Pt is progressing towards goals. He demonstrates improving functional mobility with walker and improved low range functional reach.    Occupational performance deficits (Please refer to evaluation for details):  ADL's;IADL's;Rest and  Sleep;Work;Leisure;Social Participation    Body Structure / Function / Physical Skills  ADL;Balance;Decreased knowledge of use of DME;Coordination;Edema;GMC;FMC;Flexibility;IADL;Mobility;Pain;ROM;Strength;Tone;UE functional use;Endurance    Cognitive Skills  Safety Awareness    Rehab Potential  Good    OT Frequency  2x / week    OT Duration  12 weeks    OT Treatment/Interventions  Self-care/ADL training;Aquatic Therapy;Cryotherapy;Therapeutic exercise;Neuromuscular education;Energy conservation;DME and/or AE instruction;Functional Mobility Training;Cognitive remediation/compensation;Balance training;Splinting;Manual Therapy;Passive range of motion;Therapeutic activities;Patient/family education    Plan  add to HEP, functional activities for RUE, continue NMR RUE/trunk/functional mobility     Consulted and Agree with Plan of Care  Patient    Family Member Consulted  girlfriend       Patient will benefit from skilled therapeutic intervention in order to improve the following deficits and impairments:  Body Structure / Function / Physical Skills, Cognitive Skills  Visit Diagnosis: Other symptoms and signs involving the nervous system  Hemiplegia and hemiparesis following cerebral infarction affecting right dominant side (HCC)  Unsteadiness on feet  Muscle weakness (generalized)  Other abnormalities of gait and mobility  Acute pain of right shoulder    Problem List Patient Active Problem List   Diagnosis Date Noted  . Slow transit constipation   . Benign prostatic hyperplasia with urinary retention   . Right hemiparesis ([facility])   . Chronic right shoulder pain   . Primary osteoarthritis of right shoulder   . Left basal ganglia embolic stroke ([facility]) 16/04/9603  . CVA (cerebral vascular accident) ([facility]) 08/31/2018  . Elevated aspartate aminotransferase level 06/16/2015  . H/O nutritional disorder 06/16/2015  . Excessive salivation 06/16/2015  . 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  Heart palpitations 06/16/2015  . 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 Screening for prostate cancer 08/21/2014  . Atypical chest pain 08/10/2013  . 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 [place], [place], [address] Phone: [phone]   Fax:  [phone]  Name: Edward Glass MRN: [id number] Date of Birth: November 01, 1955

## 2018-11-24 NOTE — Therapy (Signed)
[facility] [address] [place], [place], [address] Phone: [phone]   Fax:  [phone]  Physical Therapy Treatment  Patient Details  Name: Edward Glass MRN: [id number] Date of Birth: January 23, 1956 Referring Provider (PT): Alysia Penna MD   Encounter Date: 11/24/2018  PT End of Session - 11/24/18 1537    Visit Number  13    Number of Visits  17    Date for PT Re-Evaluation  12/02/18    Authorization Type  BCBS; met $7500 deductible; VL: PT/OT 30, zero used; seen on same day =1 visit; SLP 30 visits    Authorization Time Period  VL IS A HARD MAX    Authorization - Visit Number  14   PT/OT combined   Authorization - Number of Visits  30    PT Start Time  1105    PT Stop Time  1145    PT Time Calculation (min)  40 min    Activity Tolerance  Patient tolerated treatment well    Behavior During Therapy  [facility] for tasks assessed/performed       Past Medical History:  Diagnosis Date  . Atypical chest pain 08/10/2013  . Elevated aspartate aminotransferase level 06/16/2015  . Excessive salivation 06/16/2015  . H/O nutritional disorder 06/16/2015  . 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 Screening for prostate cancer 08/21/2014  . 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 HERNIA REPAIR  2006  . left tricep surgery    . LOOP RECORDER INSERTION N/A 09/04/2018   Procedure: LOOP RECORDER INSERTION;  Surgeon: Evans Lance, MD;  Location: [place] [facility];  Service: Cardiovascular;  Laterality: N/A;  . TEE WITHOUT CARDIOVERSION N/A 09/04/2018   Procedure: TRANSESOPHAGEAL ECHOCARDIOGRAM (TEE);  Surgeon: Jerline Pain, MD;  Location: [facility] ENDOSCOPY;  Service: Cardiovascular;  Laterality: N/A;   CLINIC OPERATION CHANGES: [facility] is operating at a low capacity due to COVID-19.  The patient was brought into the clinic for  evaluation and/or treatment following universal masking by staff, social distancing, and <10 people in the clinic.  The patient's COVID risk of complications score is 3.   There were no vitals filed for this visit.  Subjective Assessment - 11/24/18 1108    Subjective  The patient began using RW yesterday.    The walker did not come in, but they were able to borrow a walker from a friend.    Pertinent History  chronic rt shoulder pain; had recent fall with rt wrist pain, xray negative for fracture and had steroid injection 09/27/18    Patient Stated Goals  improve his walking and balance, be able to live on his own again in his 2 story house    Currently in Pain?  Yes    Pain Score  4    shoulder is sore with walker use   Pain Location  Shoulder    Pain Orientation  Right    Pain Type  Neuropathic pain    Pain Onset  More than a month ago    Pain Frequency  Intermittent    Aggravating Factors   using the walker    Pain Relieving Factors   rest           [facility] Adult PT Treatment/Exercise - 11/24/18 1132      Transfers   Transfers  Sit to Stand    Sit to Stand  5:  Supervision    Stand to Sit  5: Supervision      Ambulation/Gait   Ambulation/Gait  Yes    Ambulation/Gait Assistance  4: Min guard;4: Min assist    Ambulation/Gait Assistance Details  Provided tactile cues for R hip positioning to reduce rotation in the transverse plane.     Ambulation Distance (Feet)  250 Feet   x 3 reps   Assistive device  Rolling walker    Gait Pattern  Step-through pattern;Decreased stance time - right;Decreased hip/knee flexion - right;Decreased weight shift to right    Gait velocity  1.18 ft/sec    Gait Comments  Walking in the parallel bars with tactile cues R hip initiation, R knee flexion to initiate swing phase of gait and decreased UE support through left.      Neuro Re-ed    Neuro Re-ed Details   Min to mod A to get onto mat in tall kneeling with UE support through therapy bench.  Once in  tall kneeling, supported R UE using L UE.  Tall kneeling <>1/2 kneeliing with min to mod A (had to assist with lifting R LE) and then worked on tapping the left leg while in 1/2 kneeling with assist/cues for upright R trunk and hip engagement.  Quadriped over therapy bench to provide support and reduce UE loading with tactile cues R UE and then performed R hip extension <>moving back into quadriped encouraging R hip flexion.  STANDING activities in parallel bars perform step up onto descending 3" wedge to work on quad control/engagement and weight shifting with min A in parallel bars dec'ing support through L UE.                PT Short Term Goals - 11/06/18 1016      PT SHORT TERM GOAL #1   Title  Patient will be independent with HEP (Target all STGs 4 weeks 11/02/2018)    Baseline  met 11-06-18 per pt report    Status  Achieved      PT SHORT TERM GOAL #5   Title  Patient will ambulate on ramp and up/down curb with close-guarding and LRAD    Baseline  pt requires min assist with curb & ramp negotiation with [facility] for safety - 11-06-18    Time  4    Period  Weeks    Status  Not Met        PT Long Term Goals - 11/24/18 1554      PT LONG TERM GOAL #1   Title  Patient will be independent with updated HEP and verbalize plan for continued community-based exercise plan (Target all LTGs 8 weeks, 12/02/2018)    Status  On-going      PT LONG TERM GOAL #2   Title  Patient will improve gait velocity to 1.30 ft/sec demonstrating progress towards decr fall risk    Baseline  1.18 ft/sec on 11/24/2018    Status  On-going      PT LONG TERM GOAL #3   Title  Patient will improve 5x sit to stand to <12 seconds demonstrating decr fall risk    Status  On-going      PT LONG TERM GOAL #4   Title  Patient will ambulate over outdoor paved surfaces modified independent with LRAD, including up/down curb.     Status  On-going      PT LONG TERM GOAL #5   Title  Patient will ambulate >300 ft over level  indoor surface  in <= 6 minutes (6MWT)    Status  On-going      PT LONG TERM GOAL #6   Title  Patient will go up/down 12 steps with single rail modified independent     Status  On-going            Plan - 11/24/18 1554    Clinical Impression Statement  The patient is walking with RW entering clinic today.  He does note some increased soreness in R shoulder with use of RW.  PT emphasized R LE control, posture during gait, and weight shifting activities. Continue working to [employer] , due on 5/9.    PT Treatment/Interventions  ADLs/Self Care Home Management;Aquatic Therapy;Electrical Stimulation;DME Instruction;Gait training;Stair training;Functional mobility training;Therapeutic activities;Therapeutic exercise;Balance training;Orthotic Fit/Training;Patient/family education;Neuromuscular re-education;Wheelchair mobility training;Passive range of motion;Visual/perceptual remediation/compensation    PT Next Visit Plan  LTG due by 5/9.  USe RW during therapy for more symmetry.  Bioness for [name] (hamstring/ant tib)., tall kneeling/half kneeling, standing weight shifting, R motor control.  Exercises with Bioness/standing rotation/reaching at counter top-increased use of RUE    Consulted and Agree with Plan of Care  Patient    Family Member Consulted  friend - Dena       Patient will benefit from skilled therapeutic intervention in order to improve the following deficits and impairments:  Abnormal gait, Decreased activity tolerance, Decreased balance, Decreased endurance, Decreased coordination, Decreased cognition, Decreased knowledge of use of DME, Decreased mobility, Decreased safety awareness, Difficulty walking, Decreased strength, Increased edema, Impaired perceived functional ability, Impaired UE functional use  Visit Diagnosis: Other abnormalities of gait and mobility  Other symptoms and signs involving the nervous system  Unsteadiness on feet  Muscle weakness (generalized)  Abnormal  posture     Problem List Patient Active Problem List   Diagnosis Date Noted  . Slow transit constipation   . Benign prostatic hyperplasia with urinary retention   . Right hemiparesis ([place])   . Chronic right shoulder pain   . Primary osteoarthritis of right shoulder   . Left basal ganglia embolic stroke ([place]) 11/57/2620  . CVA (cerebral vascular accident) ([place]) 08/31/2018  . Elevated aspartate aminotransferase level 06/16/2015  . H/O nutritional disorder 06/16/2015  . Excessive salivation 06/16/2015  . 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 Heart palpitations 06/16/2015  . 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 Screening for prostate cancer 08/21/2014  . Atypical chest pain 08/10/2013  . 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 [place], [place], [address] Phone: [phone]   Fax:  [phone]  Name: ALPHONSUS DOYEL MRN: [id number] Date of Birth: Dec 15, 1955

## 2018-11-24 NOTE — Patient Instructions (Signed)
  Please complete the assigned speech therapy homework prior to your next session and return it to the speech therapist at your next visit.  

## 2018-11-24 NOTE — Therapy (Signed)
[facility] [address] [place], [place], [address] Phone: [phone]   Fax:  [phone]  Speech Language Pathology Treatment  Patient Details  Name: Edward Glass MRN: [id number] Date of Birth: Dec 24, 1955 Referring Provider (SLP): Alysia Penna, MD   Encounter Date: 11/24/2018  End of Session - 11/24/18 1426    Visit Number  10    Number of Visits  21    Date for SLP Re-Evaluation  12/27/18    Authorization Type  30 visits ST (20 planned, save 10?)    Authorization - Visit Number  10    Authorization - Number of Visits  30    SLP Start Time  1020    SLP Stop Time   8366    SLP Time Calculation (min)  42 min    Activity Tolerance  Patient tolerated treatment well       Past Medical History:  Diagnosis Date  . Atypical chest pain 08/10/2013  . Elevated aspartate aminotransferase level 06/16/2015  . Excessive salivation 06/16/2015  . H/O nutritional disorder 06/16/2015  . 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 Screening for prostate cancer 08/21/2014  . 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 HERNIA REPAIR  2006  . left tricep surgery    . LOOP RECORDER INSERTION N/A 09/04/2018   Procedure: LOOP RECORDER INSERTION;  Surgeon: Evans Lance, MD;  Location: [place] [facility];  Service: Cardiovascular;  Laterality: N/A;  . TEE WITHOUT CARDIOVERSION N/A 09/04/2018   Procedure: TRANSESOPHAGEAL ECHOCARDIOGRAM (TEE);  Surgeon: Jerline Pain, MD;  Location: [facility] ENDOSCOPY;  Service: Cardiovascular;  Laterality: N/A;    There were no vitals filed for this visit.  Subjective Assessment - 11/24/18 1024    Subjective  "I did the homework."    Patient is accompained by:  Family member   Dena   Currently in Pain?  No/denies    Aggravating Factors   "turning the wrong way"            ADULT SLP TREATMENT - 11/24/18 1025      General  Information   Behavior/Cognition  Alert;Cooperative   flat affect     Treatment Provided   Treatment provided  Cognitive-Linquistic      Cognitive-Linquistic Treatment   Treatment focused on  Cognition    Skilled Treatment  Working memory/attention task with telling pt 4 scrambed words and pt has to arrange into a sentence - 90% success. With 6-7 word sentences pt success decr'd to 65%; SLP noted pt inserted words that were not choices and changed words at times, despite SLP cues to do otherwise - mod cues needed occationally. Concrete analogies were completed with 88% success.       Assessment / Recommendations / Plan   Plan  Continue with current plan of care      Progression Toward Goals   Progression toward goals  Progressing toward goals       SLP Education - 11/24/18 1426    Education Details  deficit areas    Person(s) Educated  Patient;Other (comment)    Methods  Explanation    Comprehension  Verbalized understanding;Need further instruction       SLP Short Term Goals - 11/24/18 1427      SLP SHORT TERM GOAL #1   Title  Pt will undergo naming assessment and/or cognitive linguistic assessment PRN    Baseline  BNT: 10-24-18,  [facility] 11-08-18    Status  Achieved      SLP SHORT TERM GOAL #2   Title  pt will demo improved conversational volume in sentence responses- average mid-upper 60s dB, over two sessions    Time  2    Period  Weeks    Status  On-going      SLP SHORT TERM GOAL #3   Title  pt will demo improved conversational volume in 5 mintues simple conversation with average mid-upper 60s dB, over three sessions    Time  2    Period  Weeks    Status  On-going      SLP SHORT TERM GOAL #4   Title  pt will demo abdominal breathing 60% of the time at rest over two sessions    Time  1    Period  Weeks    Status  Revised   from 'in 5 minutes conversation over three sessions"     SLP SHORT TERM GOAL #5   Title  pt will name 3 non-physical deficits over three  sessions    Status  Not Met      SLP SHORT TERM GOAL #6   Title  pt/family will provide 3 overt s/s aspiration PNA with modified independence over 2 sessions    Time  1    Period  Weeks    Status  Deferred      SLP SHORT TERM GOAL #7   Title  pt will complete simple to mod complex naming tasks pertinent to pt's interests and life experience/s 85% over three sessions    Time  2    Period  Weeks    Status  On-going       SLP Long Term Goals - 11/24/18 1428      SLP LONG TERM GOAL #1   Title  pt will demo improved conversational volume in 8 minutes simple conversation average upper 60s-low 70s dB, over three sessions    Time  6    Period  Weeks   or 21 total visits, for all LTGs   Status  On-going      SLP LONG TERM GOAL #2   Title  pt will demo abdominal breathing 70% of the time in 5 minutes conversation over three sessions    Time  6    Period  Weeks    Status  On-going      SLP LONG TERM GOAL #3   Title  pt will complete a functional, simple organizational task for 25 minutes demonstrating adequate selelctive attention with modified indpendence, over three sessions    Time  7    Period  Weeks    Status  On-going      SLP LONG TERM GOAL #4   Title  pt will double check answers with speech therapy tasks (anticipatory awareness) with nonverbal cues over three sessions    Time  6    Status  On-going      SLP LONG TERM GOAL #5   Title  SLP will monitor pt to foster cont'd pulmonary health    Time  6    Period  Weeks    Status  On-going      SLP LONG TERM GOAL #6   Title  pt will demo functional word finding skills in 8 minutes simple conversation for WNL conversational fluidity over 3 sessions    Time  6    Period  Weeks    Status  On-going  SLP LONG TERM GOAL #7   Title  pt will demo compensations for memory in 80% of opportunities    Time  6    Period  Weeks    Status  On-going       Plan - 11/24/18 1427    Clinical Impression Statement  Moderate  dysarthria persists. SLP cont'd to target pt's cognitive deficits today; attention and working memory deficits again seen today. See "skilled intervention" for more details of today's session. Pt would benefit from skilled ST to maximize intelligiblity, language, and cognitive commuication skills.Marland Kitchen    Speech Therapy Frequency  2x / week    Duration  --   10 weeks or 21 visits   Treatment/Interventions  Oral motor exercises;Cueing hierarchy;Environmental controls;Compensatory techniques;Cognitive reorganization;Functional tasks;SLP instruction and feedback;Patient/family education;Internal/external aids;Aspiration precaution training;Pharyngeal strengthening exercises;Diet toleration management by SLP;Trials of upgraded texture/liquids    Potential to Achieve Goals  Good    Potential Considerations  Ability to learn/carryover information;Severity of impairments    SLP Home Exercise Plan  [place] [place] [employer]; PHoRTE    Consulted and Agree with Plan of Care  Patient       Patient will benefit from skilled therapeutic intervention in order to improve the following deficits and impairments:   Cognitive communication deficit  Dysarthria and anarthria  Aphasia  Dysphagia, oropharyngeal phase    Problem List Patient Active Problem List   Diagnosis Date Noted  . Slow transit constipation   . Benign prostatic hyperplasia with urinary retention   . Right hemiparesis ([place])   . Chronic right shoulder pain   . Primary osteoarthritis of right shoulder   . Left basal ganglia embolic stroke ([place]) 65/68/1275  . CVA (cerebral vascular accident) ([place]) 08/31/2018  . Elevated aspartate aminotransferase level 06/16/2015  . H/O nutritional disorder 06/16/2015  . Excessive salivation 06/16/2015  . 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 Heart palpitations 06/16/2015  . 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 Screening for prostate cancer 08/21/2014  . Atypical chest pain  08/10/2013  . 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

## 2018-11-27 ENCOUNTER — Ambulatory Visit: Payer: BC Managed Care – PPO | Admitting: Occupational Therapy

## 2018-11-27 ENCOUNTER — Ambulatory Visit: Payer: BC Managed Care – PPO | Admitting: Physical Therapy

## 2018-11-27 ENCOUNTER — Encounter: Payer: BLUE CROSS/BLUE SHIELD | Admitting: Occupational Therapy

## 2018-11-27 ENCOUNTER — Encounter: Payer: Self-pay | Admitting: Occupational Therapy

## 2018-11-27 ENCOUNTER — Other Ambulatory Visit: Payer: Self-pay

## 2018-11-27 ENCOUNTER — Encounter: Payer: Self-pay | Admitting: Physical Therapy

## 2018-11-27 ENCOUNTER — Ambulatory Visit: Payer: BLUE CROSS/BLUE SHIELD | Admitting: Physical Therapy

## 2018-11-27 DIAGNOSIS — R2681 Unsteadiness on feet: Secondary | ICD-10-CM

## 2018-11-27 DIAGNOSIS — I69318 Other symptoms and signs involving cognitive functions following cerebral infarction: Secondary | ICD-10-CM

## 2018-11-27 DIAGNOSIS — R293 Abnormal posture: Secondary | ICD-10-CM

## 2018-11-27 DIAGNOSIS — M25511 Pain in right shoulder: Secondary | ICD-10-CM

## 2018-11-27 DIAGNOSIS — I69351 Hemiplegia and hemiparesis following cerebral infarction affecting right dominant side: Secondary | ICD-10-CM

## 2018-11-27 DIAGNOSIS — R29818 Other symptoms and signs involving the nervous system: Secondary | ICD-10-CM

## 2018-11-27 DIAGNOSIS — M6281 Muscle weakness (generalized): Secondary | ICD-10-CM

## 2018-11-27 DIAGNOSIS — R41841 Cognitive communication deficit: Secondary | ICD-10-CM | POA: Diagnosis not present

## 2018-11-27 DIAGNOSIS — M79641 Pain in right hand: Secondary | ICD-10-CM

## 2018-11-27 DIAGNOSIS — R2689 Other abnormalities of gait and mobility: Secondary | ICD-10-CM

## 2018-11-27 NOTE — Therapy (Signed)
[facility] [address] [place], [place], [address] Phone: [phone]   Fax:  [phone]  Occupational Therapy Treatment  Patient Details  Name: Edward Glass MRN: [id number] Date of Birth: [birth year] Referring Provider (OT): Dr. Alysia Penna   Encounter Date: 11/27/2018  OT End of Session - 11/27/18 1313    Visit Number  14    Number of Visits  24    Date for OT Re-Evaluation  12/28/18    Authorization Type  BC[employer] - has 30 visits between OT and PT however if visits occur on same day counts as 1 visit. Discussion with pt re: use of remaining visits at [facility] - Visit Number  14    Authorization - Number of Visits  24    OT Start Time  3825    OT Stop Time  1230    OT Time Calculation (min)  43 min    Activity Tolerance  Patient tolerated treatment well       Past Medical History:  Diagnosis Date  . Atypical chest pain 08/10/2013  . Elevated aspartate aminotransferase level 06/16/2015  . Excessive salivation 06/16/2015  . H/O nutritional disorder 06/16/2015  . 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 Screening for prostate cancer 08/21/2014  . 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 HERNIA REPAIR  2006  . left tricep surgery    . LOOP RECORDER INSERTION N/A 09/04/2018   Procedure: LOOP RECORDER INSERTION;  Surgeon: Evans Lance, MD;  Location: [facility];  Service: Cardiovascular;  Laterality: N/A;  . TEE WITHOUT CARDIOVERSION N/A 09/04/2018   Procedure: TRANSESOPHAGEAL ECHOCARDIOGRAM (TEE);  Surgeon: Jerline Pain, MD;  Location: [facility];  Service: Cardiovascular;  Laterality: N/A;    There were no vitals filed for this visit.  Subjective Assessment - 11/27/18 1152    Subjective   I can't figure out how to move my arm that way    Patient is accompanied by:  Family member   girlfriend   Pertinent  History  L CVA on 09/14/18  Loop recorder, pt taking blood thinner    Patient Stated Goals  walk better and use my hand    Currently in Pain?  Yes    Pain Score  5     Pain Location  Shoulder    Pain Orientation  Right    Pain Descriptors / Indicators  Sharp    Pain Type  --   pt does not decribe neuropathic pain pt decribes sharp pain in shoulder joint with certain movements.     Pain Onset  More than a month ago    Pain Frequency  Intermittent    Aggravating Factors   certain movements    Pain Relieving Factors  rest                   OT Treatments/Exercises (OP) - 11/27/18 1301      Neurological Re-education Exercises   Other Exercises 1  addressed bed positioning for LUE for pain - pt and GF able to verbalize understanding and took picture.  Neuro re ed to address RUE alignment and low to mid reach patterns first in supine with mod faciliation - pt has adequate movement to complete this with minimal faciltiaton however is extremely apraxic - pt moves in patterns of shoulder extension/elbow flexion when attempting to reach.  Transitioned into side  sitting with active weight bearing on LUE while reaching in various planes with RUE -pt able to control active stability of LUR proximally without pain. Transitioned into sitting and addressed low reach using UE ranger - pt requires signficant repetition of passively leading movement and then asking pt to repeat pattern for low to mid reach using a target.  Worked in direction of foward reach as well as low horizontal reach.  Progressed to active open chain low reach with minimal facilitation and cues by end of session. Pt able to complete assisted reach to 90* without pain by end of session as well.              OT Education - 11/27/18 1311    Education Details  bed positioning for LUE, discussion re: apraxia       OT Short Term Goals - 11/27/18 1311      OT SHORT TERM GOAL #1   Title  Pt and family will be mod I  with  RUE positioning/care to address edema and pain in RUE - 11/23/2018 (goal adjusted as pt missed one week)    Status  Achieved      OT SHORT TERM GOAL #2   Title  Pt and family will be mod I with splint wear and care    Status  Achieved      OT SHORT TERM GOAL #3   Title  Pt and dtr will be mod I with HEP for RUE for ROM, addressing pain, functional use of RUE prn    Status  Achieved      OT SHORT TERM GOAL #4   Title  Pt will report pain in R hand/wrist no greater than 5/10 during HEP.      Status  Achieved      OT SHORT TERM GOAL #5   Title  Pt will demonstrate the ability for 55* of shoulder flexion as prep for functional low reach    Status  On-going   45-50     OT SHORT TERM GOAL #6   Title  Pt will be mod I with toilet and tub bench transfers    Status  On-going      OT SHORT TERM GOAL #7   Title  Pt will be mod I with bathing at shower level    Status  On-going        OT Long Term Goals - 11/27/18 1311      OT LONG TERM GOAL #1   Title  Pt and family will be mod I with upgraded home activities program - 01/05/2019 (goal adjusted as pt missed one week)    Status  On-going      OT LONG TERM GOAL #2   Title  Pt will demonstrate ability to grasp and hold light weight object in R hand with pain 2/10 or less    Status  On-going      OT LONG TERM GOAL #3   Title  Pt will demonstrate ability for bilateral low reach to obtain light weigh object     Status  On-going      OT LONG TERM GOAL #4   Title  Pt will be mod I with dressing    Status  On-going      OT LONG TERM GOAL #5   Title  Pt will be mod I with cutting food, AE prn    Status  On-going      OT LONG TERM GOAL #6  Title  Pt will demonstrate ability to use RUE as stabilizer at least 75% of the time in simple ADL tasks    Status  On-going      OT LONG TERM GOAL #7   Title  Pt will be mod I with simple snack/be prep at ambulatory level    Status  On-going      OT LONG TERM GOAL #8   Title  Pt will  demonstrate improved postural alignment to decrease pain in RUE as well as decrease risk of falls    Status  On-going            Plan - 11/27/18 1312    Clinical Impression Statement  Pt with slow progression toward goals. Pt with no shoulder pain to 90* of shoulder flexion by end of session today    Occupational performance deficits (Please refer to evaluation for details):  ADL's;IADL's;Rest and Sleep;Work;Leisure;Social Participation    Body Structure / Function / Physical Skills  ADL;Balance;Decreased knowledge of use of DME;Coordination;Edema;GMC;FMC;Flexibility;IADL;Mobility;Pain;ROM;Strength;Tone;UE functional use;Endurance    Cognitive Skills  Safety Awareness    Rehab Potential  Good    Comorbidities Affecting Occupational Performance:  Presence of comorbidities impacting occupational performance    Comorbidities impacting occupational performance description:  HTN, atypical chest pain, chronic R shoulder pain, OA of R shoulder, aphasia, apraxia, poor sensation    Modification or Assistance to Complete Evaluation   Max significant modification of tasks or assist is necessary to complete    OT Frequency  2x / week    OT Duration  12 weeks    OT Treatment/Interventions  Self-care/ADL training;Aquatic Therapy;Cryotherapy;Therapeutic exercise;Neuromuscular education;Energy conservation;DME and/or AE instruction;Functional Mobility Training;Cognitive remediation/compensation;Balance training;Splinting;Manual Therapy;Passive range of motion;Therapeutic activities;Patient/family education    Plan  Check ADL goals!! add to HEP, functional activities for RUE, continue NMR RUE/trunk/functional mobility        Patient will benefit from skilled therapeutic intervention in order to improve the following deficits and impairments:  Body Structure / Function / Physical Skills, Cognitive Skills  Visit Diagnosis: Other symptoms and signs involving the nervous system  Unsteadiness on  feet  Hemiplegia and hemiparesis following cerebral infarction affecting right dominant side (HCC)  Muscle weakness (generalized)  Abnormal posture  Acute pain of right shoulder  Pain in right hand  Other symptoms and signs involving cognitive functions following cerebral infarction    Problem List Patient Active Problem List   Diagnosis Date Noted  . Slow transit constipation   . Benign prostatic hyperplasia with urinary retention   . Right hemiparesis ([place])   . Chronic right shoulder pain   . Primary osteoarthritis of right shoulder   . Left basal ganglia embolic stroke ([place]) 61/44/3154  . CVA (cerebral vascular accident) ([place]) 08/31/2018  . Elevated aspartate aminotransferase level 06/16/2015  . H/O nutritional disorder 06/16/2015  . Excessive salivation 06/16/2015  . 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 Heart palpitations 06/16/2015  . 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 Screening for prostate cancer 08/21/2014  . Atypical chest pain 08/10/2013  . 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 [place], [place], [address] Phone: [phone]   Fax:  [phone]  Name: Edward Glass MRN: [id number] Date of Birth: 17-Jul-1956

## 2018-11-27 NOTE — Therapy (Signed)
[facility] [address] [place], [place], [address] Phone: [phone]   Fax:  [phone]  Physical Therapy Treatment  Patient Details  Name: Edward Glass MRN: [id number] Date of Birth: 07-10-1956 Referring Provider (PT): Alysia Penna MD   Encounter Date: 11/27/2018   CLINIC OPERATION CHANGES: [facility] is operating at a low capacity due to COVID-19.  The patient was brought into the clinic for evaluation and/or treatment following universal masking by staff, social distancing, and <10 people in the clinic.  The patient's COVID risk of complications score is 3.  PT End of Session - 11/27/18 1107    Visit Number  14    Number of Visits  17    Date for PT Re-Evaluation  12/02/18    Authorization Type  BCBS; met $7500 deductible; VL: PT/OT 30, zero used; seen on same day =1 visit; SLP 30 visits    Authorization Time Period  VL IS A HARD MAX    Authorization - Visit Number  15   PT/OT combined   Authorization - Number of Visits  30    PT Start Time  1100    PT Stop Time  1140   pt headed to bathroom prior to OT session   PT Time Calculation (min)  40 min    Equipment Utilized During Treatment  Gait belt    Activity Tolerance  Patient tolerated treatment well    Behavior During Therapy  WFL for tasks assessed/performed       Past Medical History:  Diagnosis Date  . Atypical chest pain 08/10/2013  . Elevated aspartate aminotransferase level 06/16/2015  . Excessive salivation 06/16/2015  . H/O nutritional disorder 06/16/2015  . 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 Screening for prostate cancer 08/21/2014  . 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 HERNIA REPAIR  2006  . left tricep surgery    . LOOP RECORDER INSERTION N/A 09/04/2018   Procedure: LOOP RECORDER INSERTION;  Surgeon: Evans Lance, MD;   Location: [facility];  Service: Cardiovascular;  Laterality: N/A;  . TEE WITHOUT CARDIOVERSION N/A 09/04/2018   Procedure: TRANSESOPHAGEAL ECHOCARDIOGRAM (TEE);  Surgeon: Jerline Pain, MD;  Location: [facility] ENDOS[facility];  Service: Cardiovascular;  Laterality: N/A;    There were no vitals filed for this visit.  Subjective Assessment - 11/27/18 1105    Subjective  No new complaints. Using walker still. No knee pain. Does continue to have shoulder pain. Has appt on 12/07/18 with Dr. Letta Pate for shoulder injection.     Patient is accompained by:  Family member   friend, Dena   Pertinent History  chronic rt shoulder pain; had recent fall with rt wrist pain, xray negative for fracture and had steroid injection 09/27/18    How long can you walk comfortably?  150 ft    Diagnostic tests  MRI-Multiple small foci of acute/early subacute infarctions present in the left MCA distribution concentrated in left basal ganglia; Small chronic infarcts in right parietal and occipital lobes    Patient Stated Goals  improve his walking and balance, be able to live on his own again in his 2 story house    Currently in Pain?  Yes    Pain Score  5     Pain Location  Shoulder    Pain Orientation  Right    Pain Descriptors / Indicators  Aching  Pain Type  Neuropathic pain    Pain Onset  More than a month ago    Pain Frequency  Intermittent    Aggravating Factors   certain movements    Pain Relieving Factors  rest         [facility] PT Assessment - 11/27/18 1113      Transfers   Transfers  Sit to Stand;Stand to Sit    Sit to Stand  5: Supervision;Without upper extremity assist;From bed    Five time sit to stand comments   10.75 sec's no UE support using standard height surface    Stand to Sit  5: Supervision;Without upper extremity assist;To bed      Ambulation/Gait   Ambulation/Gait  Yes    Ambulation/Gait Assistance  4: Min guard;4: Min assist    Ambulation/Gait Assistance Details  verbal/tactile cues for  pelvic protraction on right, fwd translation of pelvis over right stance leg after initial contact of right foot, for increased right hip/knee flexion with less circumduction during swing phase- all to improve gait mechanics and decr right foot hitting walker with gait. pt also needed cues to keep right hand back on handle of walker and for position of walker to improve gait machanics as well.     Ambulation Distance (Feet)  322 Feet   x1 indoors, 314 x1 outdoors, plus around gym with testing   Assistive device  Rolling walker    Gait Pattern  Step-through pattern;Decreased stance time - right;Decreased hip/knee flexion - right;Decreased weight shift to right    Ambulation Surface  Level;Indoor;Unlevel;Outdoor;Paved    Gait velocity  30.88 sec's= 1. 34f/sec with RW            [facility] - [facility] 111[facility]      Self-Care   Self-Care  Other Self-Care Comments    Other Self-Care Comments   discussed pt's current home program. He reports no issues and that it is still a little challenging. Would benefit from review and advancement if warrented in next session or two.         PT Short Term Goals - 11/06/18 1016      PT SHORT TERM GOAL #1   Title  Patient will be independent with HEP (Target all STGs 4 weeks 11/02/2018)    Baseline  met 11-06-18 per pt report    Status  Achieved      PT SHORT TERM GOAL #5   Title  Patient will ambulate on ramp and up/down curb with close-guarding and LRAD    Baseline  pt requires min assist with curb & ramp [facility] - 11-06-18    Time  4    Period  Weeks    Status  Not Met        PT Long Term Goals - 11/27/18 1109      PT LONG TERM GOAL #1   Title  Patient will be independent with updated HEP and verbalize plan for continued community-based exercise plan (Target all LTGs 8 weeks, 12/02/2018)    Baseline  11/27/18: met with current HEP, will plan to progress/update in next session or 2.    Status  Achieved       PT LONG TERM GOAL #2   Title  Patient will improve gait velocity to 1.30 ft/sec demonstrating progress towards decr fall risk    Baseline  11/27/18: 1.06 ft/sec with RW with supervision, cues for gait mechanics; decr'd from assessment last week on 11/24/18  Status  Not Met      PT LONG TERM GOAL #3   Title  Patient will improve 5x sit to stand to <12 seconds demonstrating decr fall risk    Baseline  11/27/18: 10.75 sec's no UE support from standard height surface    Status  Achieved      PT LONG TERM GOAL #4   Title  Patient will ambulate over outdoor paved surfaces modified independent with LRAD, including up/down curb.     Baseline  11/27/18: min guard to min assist on paved surfaces, did not assess curb     Status  Not Met      PT LONG TERM GOAL #5   Title  Patient will ambulate >300 ft over level indoor surface in <= 6 minutes (6MWT)    Baseline  11/27/18: met today with RW with min guard to min assist to correct gait mechanics    Status  Achieved      PT LONG TERM GOAL #6   Title  Patient will go up/down 12 steps with single rail modified independent     Status  On-going            Plan - 11/27/18 1108    Clinical Impression Statement  Today's skilled session began to address progress toward LTGs for anticipated recert at next session. Pt met his 5 time sit to stand goal and goal for 300 feet in </= to 6 mintues. Other goals checked were partially to not met. Will plan to assess remaining goals at next visit. Pt continues to need multimodal cues/faciliation to correct gait mechanics for more fluid gait pattern and symetrical posture.     PT Treatment/Interventions  ADLs/Self Care Home Management;Aquatic Therapy;Electrical Stimulation;DME Instruction;Gait training;Stair training;Functional mobility training;Therapeutic activities;Therapeutic exercise;Balance training;Orthotic Fit/Training;Patient/family education;Neuromuscular re-education;Wheelchair mobility training;Passive range  of motion;Visual/perceptual remediation/compensation    PT Next Visit Plan  Check remaining LTGs (curb and stairs) for anticpated recert (goals due 11/29/24); USe RW during therapy for more symmetry.  Bioness for [name] (hamstring/ant tib)., tall kneeling/half kneeling, standing weight shifting, R motor control.  Exercises with Bioness/standing rotation/reaching at counter top-increased use of RUE    Consulted and Agree with Plan of Care  Patient    Family Member Consulted  friend - Dena       Patient will benefit from skilled therapeutic intervention in order to improve the following deficits and impairments:  Abnormal gait, Decreased activity tolerance, Decreased balance, Decreased endurance, Decreased coordination, Decreased cognition, Decreased knowledge of use of DME, Decreased mobility, Decreased safety awareness, Difficulty walking, Decreased strength, Increased edema, Impaired perceived functional ability, Impaired UE functional use  Visit Diagnosis: Other abnormalities of gait and mobility  Other symptoms and signs involving the nervous system  Unsteadiness on feet  Hemiplegia and hemiparesis following cerebral infarction affecting right dominant side [facility])     Problem List Patient Active Problem List   Diagnosis Date Noted  . Slow transit constipation   . Benign prostatic hyperplasia with urinary retention   . Right hemiparesis ([place])   . Chronic right shoulder pain   . Primary osteoarthritis of right shoulder   . Left basal ganglia embolic stroke ([place]) 20/35/5974  . CVA (cerebral vascular accident) ([place]) 08/31/2018  . Elevated aspartate aminotransferase level 06/16/2015  . H/O nutritional disorder 06/16/2015  . Excessive salivation 06/16/2015  . 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 Heart palpitations 06/16/2015  . 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 Screening for prostate cancer  08/21/2014  . Atypical chest pain 08/10/2013  . Leg varices  02/07/2012    Willow Ora, PTA, [facility] [address], [place], [place] [address] [phone] 11/27/18, 12:20 PM   Name: Edward Glass MRN: [id number] Date of Birth: 25-Jan-1956

## 2018-11-28 ENCOUNTER — Ambulatory Visit: Payer: BC Managed Care – PPO | Admitting: Physical Therapy

## 2018-11-29 ENCOUNTER — Ambulatory Visit: Payer: BC Managed Care – PPO | Admitting: Occupational Therapy

## 2018-11-29 ENCOUNTER — Ambulatory Visit: Payer: BC Managed Care – PPO | Admitting: Physical Therapy

## 2018-11-29 ENCOUNTER — Other Ambulatory Visit: Payer: Self-pay

## 2018-11-29 ENCOUNTER — Encounter: Payer: BLUE CROSS/BLUE SHIELD | Admitting: Occupational Therapy

## 2018-11-29 ENCOUNTER — Ambulatory Visit: Payer: BLUE CROSS/BLUE SHIELD | Admitting: Physical Therapy

## 2018-11-29 ENCOUNTER — Ambulatory Visit: Payer: BC Managed Care – PPO

## 2018-11-29 DIAGNOSIS — R471 Dysarthria and anarthria: Secondary | ICD-10-CM

## 2018-11-29 DIAGNOSIS — R2681 Unsteadiness on feet: Secondary | ICD-10-CM

## 2018-11-29 DIAGNOSIS — R2689 Other abnormalities of gait and mobility: Secondary | ICD-10-CM

## 2018-11-29 DIAGNOSIS — M25511 Pain in right shoulder: Secondary | ICD-10-CM

## 2018-11-29 DIAGNOSIS — I69351 Hemiplegia and hemiparesis following cerebral infarction affecting right dominant side: Secondary | ICD-10-CM

## 2018-11-29 DIAGNOSIS — R41841 Cognitive communication deficit: Secondary | ICD-10-CM | POA: Diagnosis not present

## 2018-11-29 DIAGNOSIS — R29818 Other symptoms and signs involving the nervous system: Secondary | ICD-10-CM

## 2018-11-29 DIAGNOSIS — M6281 Muscle weakness (generalized): Secondary | ICD-10-CM

## 2018-11-29 DIAGNOSIS — R4701 Aphasia: Secondary | ICD-10-CM

## 2018-11-29 DIAGNOSIS — R293 Abnormal posture: Secondary | ICD-10-CM

## 2018-11-29 DIAGNOSIS — R1312 Dysphagia, oropharyngeal phase: Secondary | ICD-10-CM

## 2018-11-29 NOTE — Therapy (Signed)
Edward Glass [address] [place], [place], [address] Phone: [phone]   Fax:  [phone]  Occupational Therapy Treatment  Patient Details  Name: Edward Glass MRN: [id number] Date of Birth: Nov 23, 1955 Referring Provider (OT): Dr. Alysia Penna   Encounter Date: 11/29/2018  OT End of Session - 11/29/18 1025    Visit Number  15    Number of Visits  24    Date for OT Re-Evaluation  12/28/18    Authorization Type  [employer] - has 30 visits between OT and PT however if visits occur on same day counts as 1 visit. Discussion with pt re: use of remaining visits at [place] - Visit Number  15    Authorization - Number of Visits  24    OT Start Time  1018    OT Stop Time  1100    OT Time Calculation (min)  42 min       Past Medical History:  Diagnosis Date  . Atypical chest pain 08/10/2013  . Elevated aspartate aminotransferase level 06/16/2015  . Excessive salivation 06/16/2015  . H/O nutritional disorder 06/16/2015  . 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 Screening for prostate cancer 08/21/2014  . 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 HERNIA REPAIR  2006  . left tricep surgery    . LOOP RECORDER INSERTION N/A 09/04/2018   Procedure: LOOP RECORDER INSERTION;  Surgeon: Evans Lance, MD;  Location: [facility];  Service: Cardiovascular;  Laterality: N/A;  . TEE WITHOUT CARDIOVERSION N/A 09/04/2018   Procedure: TRANSESOPHAGEAL ECHOCARDIOGRAM (TEE);  Surgeon: Jerline Pain, MD;  Location: [facility] ENDOS[facility];  Service: Cardiovascular;  Laterality: N/A;    There were no vitals filed for this visit.  Subjective Assessment - 11/29/18 1024    Patient Stated Goals  walk better and use my hand    Currently in Pain?  Yes    Pain Score  3     Pain Location  Shoulder    Pain Orientation  Right    Pain Descriptors / Indicators   Aching    Pain Onset  More than a month ago    Pain Frequency  Intermittent    Aggravating Factors   certain movements    Pain Relieving Factors  rest    Multiple Pain Sites  No          Treatment:.Pt was treated in the clinic today with universal masking by staff, and pt masking with reduced number of patients in the clinic due to COVID-19.  supine gentle joint mobs to right shoulder followed by lower trunk rotation and RUE shoulder abduction, min-mod facilitation Weightbearing through right elbow and hand  hand with body on arm movements lateral trunk flexion, rotation  and weight shifts,min- mod facilitation. Standing at tabletop, modified cat/ cow followed by gentle rocking forwards and backwards with bilateral UE's on tabletop. Seated gentle table slides within tolerated ROM,  Seated Low range functional reaching to grasp/ relase cylindrical objects min-mod facilitation/ v.c for hand function  Therapist  added to HEP, see pt instructions                 OT Education - 11/29/18 1307    Education Details  updates to HEP, see pt instructions.    Person(s) Educated  Patient;Other (comment)    Methods  Explanation;Demonstration;Handout    Comprehension  Verbalized understanding;Returned  demonstration;Verbal cues required       OT Short Term Goals - 11/29/18 1302      OT SHORT TERM GOAL #1   Title  Pt and family will be mod I  with RUE positioning/care to address edema and pain in RUE - 11/23/2018 (goal adjusted as pt missed one week)    Status  Achieved      OT SHORT TERM GOAL #2   Title  Pt and family will be mod I with splint wear and care    Status  Achieved      OT SHORT TERM GOAL #3   Title  Pt and dtr will be mod I with HEP for RUE for ROM, addressing pain, functional use of RUE prn    Status  Achieved      OT SHORT TERM GOAL #4   Title  Pt will report pain in R hand/wrist no greater than 5/10 during HEP.      Status  Achieved      OT SHORT TERM GOAL  #5   Title  Pt will demonstrate the ability for 55* of shoulder flexion as prep for functional low reach    Status  On-going   45-50     OT SHORT TERM GOAL #6   Title  Pt will be mod I with toilet and tub bench transfers    Status  Achieved   met per pt report     OT SHORT TERM GOAL #7   Title  Pt will be mod I with bathing at shower level    Status  Achieved   met per pt report       OT Long Term Goals - 11/27/18 1311      OT LONG TERM GOAL #1   Title  Pt and family will be mod I with upgraded home activities program - 01/05/2019 (goal adjusted as pt missed one week)    Status  On-going      OT LONG TERM GOAL #2   Title  Pt will demonstrate ability to grasp and hold light weight object in R hand with pain 2/10 or less    Status  On-going      OT LONG TERM GOAL #3   Title  Pt will demonstrate ability for bilateral low reach to obtain light weigh object     Status  On-going      OT LONG TERM GOAL #4   Title  Pt will be mod I with dressing    Status  On-going      OT LONG TERM GOAL #5   Title  Pt will be mod I with cutting food, AE prn    Status  On-going      OT LONG TERM GOAL #6   Title  Pt will demonstrate ability to use RUE as stabilizer at least 75% of the time in simple ADL tasks    Status  On-going      OT LONG TERM GOAL #7   Title  Pt will be mod I with simple snack/be prep at ambulatory level    Status  On-going      OT LONG TERM GOAL #8   Title  Pt will demonstrate improved postural alignment to decrease pain in RUE as well as decrease risk of falls    Status  On-going            Plan - 11/29/18 1300    Clinical Impression Statement  Pt is progressing towards  ADL goals. Pt reports he is showering and dressing himself without assistance. Pt remains limited by right shoulder pain, and apraxia.    Occupational performance deficits (Please refer to evaluation for details):  ADL's;IADL's;Rest and Sleep;Work;Leisure;Social Participation    Body Structure  / Function / Physical Skills  ADL;Balance;Decreased knowledge of use of DME;Coordination;Edema;GMC;FMC;Flexibility;IADL;Mobility;Pain;ROM;Strength;Tone;UE functional use;Endurance    Rehab Potential  Good    OT Frequency  2x / week    OT Duration  12 weeks    OT Treatment/Interventions  Self-care/ADL training;Aquatic Therapy;Cryotherapy;Therapeutic exercise;Neuromuscular education;Energy conservation;DME and/or AE instruction;Functional Mobility Training;Cognitive remediation/compensation;Balance training;Splinting;Manual Therapy;Passive range of motion;Therapeutic activities;Patient/family education    Plan   continue NMR RUE/trunk/functional mobility     Consulted and Agree with Plan of Care  Patient    Family Member Consulted  girlfriend       Patient will benefit from skilled therapeutic intervention in order to improve the following deficits and impairments:  Body Structure / Function / Physical Skills, Cognitive Skills  Visit Diagnosis: Other symptoms and signs involving the nervous system  Muscle weakness (generalized)  Hemiplegia and hemiparesis following cerebral infarction affecting right dominant side (HCC)  Abnormal posture  Acute pain of right shoulder    Problem List Patient Active Problem List   Diagnosis Date Noted  . Slow transit constipation   . Benign prostatic hyperplasia with urinary retention   . Right hemiparesis ([place])   . Chronic right shoulder pain   . Primary osteoarthritis of right shoulder   . Left basal ganglia embolic stroke ([place]) 12/45/8099  . CVA (cerebral vascular accident) ([employer]) 08/31/2018  . Elevated aspartate aminotransferase level 06/16/2015  . H/O nutritional disorder 06/16/2015  . Excessive salivation 06/16/2015  . 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 Heart palpitations 06/16/2015  . 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 Screening for prostate cancer 08/21/2014  . Atypical chest pain 08/10/2013  . Leg  varices 02/07/2012    Vonetta Foulk 11/29/2018, 1:08 PM  [facility] [address] [place], [place], [address] Phone: [phone]   Fax:  [phone]  Name: YANIV LAGE MRN: [id number] Date of Birth: August 12, 1955

## 2018-11-29 NOTE — Therapy (Signed)
[facility] [address] [place], [place], [address] Phone: [phone]   Fax:  [phone]  Speech Language Pathology Treatment  Patient Details  Name: Edward Glass MRN: [id number] Date of Birth: 05-07-1956 Referring Provider (SLP): Alysia Penna, MD   Encounter Date: 11/29/2018  End of Session - 11/29/18 1608    Visit Number  11    Number of Visits  21    Date for SLP Re-Evaluation  12/27/18    Authorization Type  30 visits ST (20 planned, save 10?)    Authorization - Visit Number  11    Authorization - Number of Visits  30    SLP Start Time  1150    SLP Stop Time   3818    SLP Time Calculation (min)  40 min       Past Medical History:  Diagnosis Date  . Atypical chest pain 08/10/2013  . Elevated aspartate aminotransferase level 06/16/2015  . Excessive salivation 06/16/2015  . H/O nutritional disorder 06/16/2015  . 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 Screening for prostate cancer 08/21/2014  . 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 HERNIA REPAIR  2006  . left tricep surgery    . LOOP RECORDER INSERTION N/A 09/04/2018   Procedure: LOOP RECORDER INSERTION;  Surgeon: Evans Lance, MD;  Location: [facility];  Service: Cardiovascular;  Laterality: N/A;  . TEE WITHOUT CARDIOVERSION N/A 09/04/2018   Procedure: TRANSESOPHAGEAL ECHOCARDIOGRAM (TEE);  Surgeon: Jerline Pain, MD;  Location: [facility] ENDOSCOPY;  Service: Cardiovascular;  Laterality: N/A;    There were no vitals filed for this visit.  Subjective Assessment - 11/29/18 1208    Subjective  Dena handed homework to SLP.     Patient is accompained by:  Family member   Dena   Currently in Pain?  Yes    Pain Score  2     Pain Location  Shoulder    Pain Orientation  Right    Pain Descriptors / Indicators  Aching    Pain Type  Chronic pain    Pain Onset  More than a  month ago    Pain Frequency  Intermittent    Aggravating Factors   specific movements    Pain Relieving Factors  not moving            ADULT SLP TREATMENT - 11/29/18 1211      General Information   Behavior/Cognition  Alert;Cooperative   flat affect     Treatment Provided   Treatment provided  Cognitive-Linquistic      Cognitive-Linquistic Treatment   Treatment focused on  Dysarthria    Skilled Treatment  SLP targeted pt's dysarthria today - reviewed his homework tasks. Pt with definitively more variability in his higher and lower pitch sentences with occasional min SLP A. "heyAhhhh" is also stronger and with a longer duration than previously targeted in ST sessions. SLP reviewed parameters of abdominal breathing (AB) with pt and pt achieved AB at rest 75% success over 3 minutes with min SLP cues faded to independent. In simple yes/no responses pt maintained AB 100% with some overcompensating with heavy breathing; SLP assumes due to hyperconcentration on breathing technique. With 1-2 word answers to "wh" questions re: 2 sentence "stories" pt req'd min-mod A occasionally to use AB for his response. SLP explained to pt his responses without AB were likely due to difficulty with multitasking focus  on AB and the answer to the question.       Assessment / Recommendations / Plan   Plan  Continue with current plan of care      Progression Toward Goals   Progression toward goals  Progressing toward goals         SLP Short Term Goals - 11/29/18 1610      SLP SHORT TERM GOAL #1   Title  Pt will undergo naming assessment and/or cognitive linguistic assessment PRN    Baseline  BNT: 10-24-18, MoCA 11-08-18    Status  Achieved      SLP SHORT TERM GOAL #2   Title  pt will demo improved conversational volume in sentence responses- average mid-upper 60s dB, over two sessions    Time  1    Period  Weeks    Status  On-going      SLP SHORT TERM GOAL #3   Title  pt will demo improved  conversational volume in 5 mintues simple conversation with average mid-upper 60s dB, over three sessions    Time  1    Period  Weeks    Status  On-going      SLP SHORT TERM GOAL #4   Title  pt will demo abdominal breathing 60% of the time at rest over two sessions    Baseline  11-29-18    Time  1    Period  Weeks    Status  On-going   from 'in 5 minutes conversation over three sessions"     SLP SHORT TERM GOAL #5   Title  pt will name 3 non-physical deficits over three sessions    Status  Not Met      SLP SHORT TERM GOAL #6   Title  pt/family will provide 3 overt s/s aspiration PNA with modified independence over 2 sessions    Time  1    Period  Weeks    Status  Deferred      SLP SHORT TERM GOAL #7   Title  pt will complete simple to mod complex naming tasks pertinent to pt's interests and life experience/s 85% over three sessions    Time  1    Period  Weeks    Status  On-going       SLP Long Term Goals - 11/29/18 1614      SLP LONG TERM GOAL #1   Title  pt will demo improved conversational volume in 8 minutes simple conversation average upper 60s-low 70s dB, over three sessions    Time  5    Period  Weeks   or 21 total visits, for all LTGs   Status  On-going      SLP LONG TERM GOAL #2   Title  pt will demo abdominal breathing 70% of the time in 5 minutes conversation over three sessions    Time  5    Period  Weeks    Status  On-going      SLP LONG TERM GOAL #3   Title  pt will complete a functional, simple organizational task for 25 minutes demonstrating adequate selelctive attention with modified indpendence, over three sessions    Time  6    Period  Weeks    Status  On-going      SLP LONG TERM GOAL #4   Title  pt will double check answers with speech therapy tasks (anticipatory awareness) with nonverbal cues over three sessions    Time  5  Status  On-going      SLP LONG TERM GOAL #5   Title  SLP will monitor pt to foster cont'd pulmonary health    Time   5    Period  Weeks    Status  On-going      SLP LONG TERM GOAL #6   Title  pt will demo functional word finding skills in 8 minutes simple conversation for WNL conversational fluidity over 3 sessions    Time  5    Period  Weeks    Status  On-going      SLP LONG TERM GOAL #7   Title  pt will demo compensations for memory in 80% of opportunities    Time  5    Period  Weeks    Status  On-going       Plan - 11/29/18 1609    Clinical Impression Statement  Moderate dysarthria persists. SLP cont'd to observe pt's cognitive deficits today. Pt's vocal strength appears improved since last session. See "skilled intervention" for more details of today's session. Pt would benefit from skilled ST to maximize intelligiblity, language, and cognitive commuication skills.Marland Kitchen    Speech Therapy Frequency  2x / week    Duration  --   10 weeks or 21 visits   Treatment/Interventions  Oral motor exercises;Cueing hierarchy;Environmental controls;Compensatory techniques;Cognitive reorganization;Functional tasks;SLP instruction and feedback;Patient/family education;Internal/external aids;Aspiration precaution training;Pharyngeal strengthening exercises;Diet toleration management by SLP;Trials of upgraded texture/liquids    Potential to Achieve Goals  Good    Potential Considerations  Ability to learn/carryover information;Severity of impairments    SLP Home Exercise Plan  Loud Hey and Ah; PHoRTE    Consulted and Agree with Plan of Care  Patient       Patient will benefit from skilled therapeutic intervention in order to improve the following deficits and impairments:   Cognitive communication deficit  Dysarthria and anarthria  Aphasia  Dysphagia, oropharyngeal phase    Problem List Patient Active Problem List   Diagnosis Date Noted  . Slow transit constipation   . Benign prostatic hyperplasia with urinary retention   . Right hemiparesis ([place])   . Chronic right shoulder pain   . Primary  osteoarthritis of right shoulder   . Left basal ganglia embolic stroke ([place]) 34/09/7094  . CVA (cerebral vascular accident) ([place]) 08/31/2018  . Elevated aspartate aminotransferase level 06/16/2015  . H/O nutritional disorder 06/16/2015  . Excessive salivation 06/16/2015  . 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 Heart palpitations 06/16/2015  . 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 Screening for prostate cancer 08/21/2014  . Atypical chest pain 08/10/2013  . 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 [place], [place], [address] Phone: [phone]   Fax:  [phone]   Name: Edward Glass MRN: [id number] Date of Birth: January 22, 1956

## 2018-11-29 NOTE — Patient Instructions (Signed)
  Please complete the assigned speech therapy homework prior to your next session and return it to the speech therapist at your next visit.  

## 2018-11-29 NOTE — Patient Instructions (Addendum)
Place both hands on a towel, slide towel forwards and back wards in seated, stop if you have pain 10 reps  Standing at table have someone with you,place hands on table, rock forwards and backwards 10 reps, place a non slip mat  under right hand   Use right hand to grasp /release cups, checkers and plastic bottles, do not hike shoulder

## 2018-11-30 NOTE — Therapy (Signed)
[facility] [address] [place], [place], [address] Phone: [phone]   Fax:  [phone]  Physical Therapy Treatment  Patient Details  Name: Edward Glass MRN: [id number] Date of Birth: 04-17-1956 Referring Provider (PT): Alysia Penna MD   CLINIC OPERATION CHANGES: [facility] is operating at a low capacity due to COVID-19.  The patient was brought into the clinic for evaluation and/or treatment following  universal masking by staff, social distancing, and <10 people in the clinic.  The patient's COVID risk of complications score is 3.  Encounter Date: 11/29/2018  PT End of Session - 11/30/18 1451    Visit Number  15   total visit count for PT and OT = 16   Number of Visits  17    Date for PT Re-Evaluation  12/02/18    Authorization Type  BC[employer]; met $7500 deductible; VL: PT/OT 30, zero used; seen on same day =1 visit; SLP 30 visits    Authorization Time Period  VL IS A HARD MAX    Authorization - Visit Number  16    Authorization - Number of Visits  30    PT Start Time  4818    PT Stop Time  1148    PT Time Calculation (min)  43 min    Equipment Utilized During Treatment  Gait belt    Activity Tolerance  Patient tolerated treatment well    Behavior During Therapy  WFL for tasks assessed/performed       Past Medical History:  Diagnosis Date  . Atypical chest pain 08/10/2013  . Elevated aspartate aminotransferase level 06/16/2015  . Excessive salivation 06/16/2015  . H/O nutritional disorder 06/16/2015  . 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 Screening for prostate cancer 08/21/2014  . 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 HERNIA REPAIR  2006  . left tricep surgery    . LOOP RECORDER INSERTION N/A 09/04/2018   Procedure: LOOP RECORDER INSERTION;  Surgeon: Evans Lance, MD;  Location: [facility];  Service: Cardiovascular;  Laterality: N/A;  . TEE WITHOUT CARDIOVERSION N/A 09/04/2018   Procedure: TRANSESOPHAGEAL ECHOCARDIOGRAM (TEE);  Surgeon: Jerline Pain, MD;  Location: [facility] ENDOSCOPY;  Service: Cardiovascular;  Laterality: N/A;    There were no vitals filed for this visit.  Subjective Assessment - 11/30/18 1441    Subjective  Pt reports no changes/no new complaints; pt using RW for assistance with ambulation    Patient is accompained by:  Family member   friend - Dena   Pertinent History  chronic rt shoulder pain; had recent fall with rt wrist pain, xray negative for fracture and had steroid injection 09/27/18    Diagnostic tests  MRI-Multiple small foci of acute/early subacute infarctions present in the left MCA distribution concentrated in left basal ganglia; Small chronic infarcts in right parietal and occipital lobes    Patient Stated Goals  improve his walking and balance, be able to live on his own again in his 2 story house    Currently in Pain?  Yes    Pain Score  2     Pain Location  Shoulder    Pain Orientation  Right    Pain Descriptors / Indicators  Aching    Pain Type  Chronic pain    Pain Onset  More than a month ago    Pain Frequency  Intermittent  Aggravating Factors   specific movements    Pain Relieving Factors  not moving    Multiple Pain Sites  No                       [facility] Adult PT Treatment/Exercise - 11/30/18 0001      Ambulation/Gait   Ambulation/Gait  Yes    Ambulation/Gait Assistance  4: Min assist   with gait training without device    Ambulation/Gait Assistance Details  tactile cues for Rt pelvic protraction and wieght shift onto [name]; increased Rt hip and knee flexion noted in swing phase of gait     Ambulation Distance (Feet)  30 Feet   without device; 100' with RW   Assistive device  None;Rolling walker    Gait Pattern  Step-through pattern;Decreased stance time - right;Decreased hip/knee flexion - right;Decreased  weight shift to right    Ambulation Surface  Level;Indoor    Stairs  Yes    Stairs Assistance  4: Min guard    Stair Management Technique  One rail Left;Step to pattern;Forwards    Number of Stairs  12   4 steps x 3 reps   Height of Stairs  6      Neuro Re-ed    Neuro Re-ed Details   Rockerboard placed inside // bars - pt stood on board - tapped floor in front with LLE to facilitate Rt anterior weight shift and Rt ankle AROM - mod assist with this activity      NeuroRe-ed:  Tap ups to 1st step with LLE with min tactile cue to prevent Rt knee hyperextension in stance - 10 reps with LUE support  Stepping over and back of black balance beam inside // bars with LLE 10 reps with CGA  for [name] weight shift and to facilitate Rt knee control to  prevent hyperextension - min assist to block Rt knee from hyperextending  Step ups with [name] onto 6" step with LUE support on rail 10 reps - min tactile cue to prevent Rt knee hyperextension in single limb weight bearing   Gait; pt gait trained inside // bars - 3 reps forward 10' with mirror used for visual feedback; backward 10' x 3 reps to facilitate Rt knee flexion in swing phase of gait    PT Short Term Goals - 11/06/18 1016      PT SHORT TERM GOAL #1   Title  Patient will be independent with HEP (Target all STGs 4 weeks 11/02/2018)    Baseline  met 11-06-18 per pt report    Status  Achieved      PT SHORT TERM GOAL #5   Title  Patient will ambulate on ramp and up/down curb with close-guarding and LRAD    Baseline  pt requires min assist with curb & ramp negotiation with [facility] for safety - 11-06-18    Time  4    Period  Weeks    Status  Not Met        PT Long Term Goals - 11/29/18 1110      PT LONG TERM GOAL #1   Title  Patient will be independent with updated HEP and verbalize plan for continued community-based exercise plan (Target all LTGs 8 weeks, 12/02/2018)    Baseline  11/27/18: met with current HEP, will plan to progress/update in next  session or 2.    Status  Achieved      PT LONG TERM GOAL #2   Title  Patient will improve gait velocity to 1.30 ft/sec demonstrating progress towards decr fall risk    Baseline  11/27/18: 1.06 ft/sec with RW with supervision, cues for gait mechanics; decr'd from assessment last week on 11/24/18    Status  Not Met      PT LONG TERM GOAL #3   Title  Patient will improve 5x sit to stand to <12 seconds demonstrating decr fall risk    Baseline  11/27/18: 10.75 sec's no UE support from standard height surface    Status  Achieved      PT LONG TERM GOAL #4   Title  Patient will ambulate over outdoor paved surfaces modified independent with LRAD, including up/down curb.     Baseline  11/27/18: min guard to min assist on paved surfaces, did not assess curb     Status  Not Met      PT LONG TERM GOAL #5   Title  Patient will ambulate >300 ft over level indoor surface in <= 6 minutes (6MWT)    Baseline  11/27/18: met today with RW with min guard to min assist to correct gait mechanics    Status  Achieved      PT LONG TERM GOAL #6   Title  Patient will go up/down 12 steps with single rail modified independent     Baseline  min guard/CGA needed for safety - 11-29-18; Lt hand rail used    Status  Not Met            Plan - 11/30/18 1455    Clinical Impression Statement  Assessed remaining LTG #6 not checked on 11-27-18 -- pt did not meet LTG #6 as CGA required for safety with step negotiation.  Pt improving with gait pattern as increased Lt hip and knee flexion noted in swing phase of gait at end of session.    Personal Factors and Comorbidities  Comorbidity 1    Comorbidities  chronic rt shoulder pain    Examination-Activity Limitations  Bathing;Bed Mobility;Caring for Others;Carry;Dressing;Locomotion Level;Lift;Reach Overhead;Self Feeding;Stairs    Examination-Participation Restrictions  Community Activity;Driving    Stability/Clinical Decision Making  Stable/Uncomplicated    Rehab Potential  Good     PT Frequency  2x / week    PT Duration  8 weeks    PT Treatment/Interventions  ADLs/Self Care Home Management;Aquatic Therapy;Electrical Stimulation;DME Instruction;Gait training;Stair training;Functional mobility training;Therapeutic activities;Therapeutic exercise;Balance training;Orthotic Fit/Training;Patient/family education;Neuromuscular re-education;Wheelchair mobility training;Passive range of motion;Visual/perceptual remediation/compensation       Patient will benefit from skilled therapeutic intervention in order to improve the following deficits and impairments:  Abnormal gait, Decreased activity tolerance, Decreased balance, Decreased endurance, Decreased coordination, Decreased cognition, Decreased knowledge of use of DME, Decreased mobility, Decreased safety awareness, Difficulty walking, Decreased strength, Increased edema, Impaired perceived functional ability, Impaired UE functional use  Visit Diagnosis: Other abnormalities of gait and mobility  Hemiplegia and hemiparesis following cerebral infarction affecting right dominant side (HCC)  Unsteadiness on feet     Problem List Patient Active Problem List   Diagnosis Date Noted  . Slow transit constipation   . Benign prostatic hyperplasia with urinary retention   . Right hemiparesis ([place])   . Chronic right shoulder pain   . Primary osteoarthritis of right shoulder   . Left basal ganglia embolic stroke ([place]) 36/14/4315  . CVA (cerebral vascular accident) ([facility]) 08/31/2018  . Elevated aspartate aminotransferase level 06/16/2015  . H/O nutritional disorder 06/16/2015  . Excessive salivation 06/16/2015  . 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  Heart palpitations 06/16/2015  . 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 Screening for prostate cancer 08/21/2014  . Atypical chest pain 08/10/2013  . 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 [place], [place], [address] Phone: [phone]   Fax:  [phone]  Name: KIMI KROFT MRN: [id number] Date of Birth: 03-Jun-1956

## 2018-12-01 ENCOUNTER — Other Ambulatory Visit: Payer: Self-pay

## 2018-12-01 ENCOUNTER — Ambulatory Visit: Payer: BC Managed Care – PPO

## 2018-12-01 ENCOUNTER — Ambulatory Visit: Payer: BC Managed Care – PPO | Admitting: Occupational Therapy

## 2018-12-01 DIAGNOSIS — R41841 Cognitive communication deficit: Secondary | ICD-10-CM | POA: Diagnosis not present

## 2018-12-01 DIAGNOSIS — R471 Dysarthria and anarthria: Secondary | ICD-10-CM

## 2018-12-01 DIAGNOSIS — R4701 Aphasia: Secondary | ICD-10-CM

## 2018-12-01 DIAGNOSIS — R1312 Dysphagia, oropharyngeal phase: Secondary | ICD-10-CM

## 2018-12-01 NOTE — Therapy (Signed)
[facility] [address] [place], [place], [address] Phone: [phone]   Fax:  [phone]  Speech Language Pathology Treatment  Patient Details  Name: Edward Glass MRN: [id number] Date of Birth: 10-Dec-1955 Referring Provider (SLP): Alysia Penna, MD   Encounter Date: 12/01/2018  End of Session - 12/01/18 1311    Visit Number  12    Number of Visits  21    Date for SLP Re-Evaluation  12/27/18    Authorization Type  30 visits ST (20 planned, save 10?)    Authorization - Visit Number  12    Authorization - Number of Visits  30    SLP Start Time  1202   pt 16 minutes late for ST   SLP Stop Time   1231    SLP Time Calculation (min)  29 min    Activity Tolerance  Patient tolerated treatment well       Past Medical History:  Diagnosis Date  . Atypical chest pain 08/10/2013  . Elevated aspartate aminotransferase level 06/16/2015  . Excessive salivation 06/16/2015  . H/O nutritional disorder 06/16/2015  . 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 Screening for prostate cancer 08/21/2014  . 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 HERNIA REPAIR  2006  . left tricep surgery    . LOOP RECORDER INSERTION N/A 09/04/2018   Procedure: LOOP RECORDER INSERTION;  Surgeon: Evans Lance, MD;  Location: [facility];  Service: Cardiovascular;  Laterality: N/A;  . TEE WITHOUT CARDIOVERSION N/A 09/04/2018   Procedure: TRANSESOPHAGEAL ECHOCARDIOGRAM (TEE);  Surgeon: Jerline Pain, MD;  Location[facility] ENDOSCOPY;  Service: Cardiovascular;  Laterality: N/A;    There were no vitals filed for this visit.  Subjective Assessment - 12/01/18 1206    Subjective  Pt arrived 16 minutes late.    Patient is accompained by:  --   Dena  -SO   Currently in Pain?  Yes    Pain Score  4     Pain Location  Shoulder    Pain Orientation  Right    Pain Type  Chronic  pain    Pain Onset  More than a month ago    Pain Frequency  Intermittent    Aggravating Factors   moving certain ways    Pain Relieving Factors  rest            ADULT SLP TREATMENT - 12/01/18 1207      General Information   Behavior/Cognition  Alert;Cooperative   flat affect     Treatment Provided   Treatment provided  Cognitive-Linquistic      Cognitive-Linquistic Treatment   Treatment focused on  Dysarthria    Skilled Treatment  "Hey- Aaaaaaaah" with min-mod cues for correct procedure (short HEY long Ah). Phrase level responses with 85% success for WNL loudness however with incr'd cognitive load (pt elaborating) success decr'd to 60%. Pt often had difficulty generating two different responses for the same stimulus and repeated the previous, seemingly unaware he did so. Pt's responses also included aphasic responses (e.g., "paddle" for "raquet") and pt stated he was unsure if aphasic epsisodes occurred in his spontaneous speech more freuqnetly now than prior to CVA.       Assessment / Recommendations / Plan   Plan  Continue with current plan of care      Progression Toward Goals   Progression toward goals  Progressing toward  goals       SLP Education - 12/01/18 1310    Education Details  incr'd incidence of aphasia occurs with people after CVA    Person(s) Educated  Patient;Other (comment)    Methods  Explanation    Comprehension  Need further instruction       SLP Short Term Goals - 12/01/18 1313      SLP SHORT TERM GOAL #1   Title  Pt will undergo naming assessment and/or cognitive linguistic assessment PRN    Baseline  BNT: 10-24-18, [facility] 11-08-18    Status  Achieved      SLP SHORT TERM GOAL #2   Title  pt will demo improved conversational volume in sentence responses- average mid-upper 60s dB, over two sessions    Status  Not Met      SLP SHORT TERM GOAL #3   Title  pt will demo improved conversational volume in 5 mintues simple conversation with average  mid-upper 60s dB, over three sessions    Status  Not Met      SLP SHORT TERM GOAL #4   Title  pt will demo abdominal breathing 60% of the time at rest over two sessions    Baseline  11-29-18, 12-01-18    Status  Achieved   from 'in 5 minutes conversation over three sessions"     SLP SHORT TERM GOAL #5   Title  pt will name 3 non-physical deficits over three sessions    Status  Not Met      SLP SHORT TERM GOAL #6   Title  pt/family will provide 3 overt s/s aspiration PNA with modified independence over 2 sessions    Time  1    Period  Weeks    Status  Deferred      SLP SHORT TERM GOAL #7   Title  pt will complete simple to mod complex naming tasks pertinent to pt's interests and life experience/s 85% over three sessions    Status  Not Met       SLP Long Term Goals - 12/01/18 1314      SLP LONG TERM GOAL #1   Title  pt will demo improved conversational volume in 8 minutes simple conversation average upper 60s-low 70s dB, over three sessions    Time  5    Period  Weeks   or 21 total visits, for all LTGs   Status  On-going      SLP LONG TERM GOAL #2   Title  pt will demo abdominal breathing 70% of the time in 5 minutes conversation over three sessions    Time  5    Period  Weeks    Status  On-going      SLP LONG TERM GOAL #3   Title  pt will complete a functional, simple organizational task for 25 minutes demonstrating adequate selelctive attention with modified indpendence, over three sessions    Time  6    Period  Weeks    Status  On-going      SLP LONG TERM GOAL #4   Title  pt will double check answers with speech therapy tasks (anticipatory awareness) with nonverbal cues over three sessions    Time  5    Status  On-going      SLP LONG TERM GOAL #5   Title  SLP will monitor pt to foster cont'd pulmonary health    Time  5    Period  Weeks  Status  On-going      SLP LONG TERM GOAL #6   Title  pt will demo functional word finding skills in 8 minutes simple  conversation for WNL conversational fluidity over 3 sessions    Time  5    Period  Weeks    Status  On-going      SLP LONG TERM GOAL #7   Title  pt will demo compensations for memory in 80% of opportunities    Time  5    Period  Weeks    Status  On-going       Plan - 12/01/18 1312    Clinical Impression Statement  Moderate dysarthria persists. SLP cont'd to observe pt's aphasia and cognitive deficits today. See "skilled intervention" for more details of today's session. SLP unsure if pt awareness is decr'd or if this is personality/decr'd acceptance of errors and deficits. Pt would benefit from skilled ST to maximize intelligiblity, language, and cognitive commuication skills.Marland Kitchen    Speech Therapy Frequency  2x / week    Duration  --   10 weeks or 21 visits   Treatment/Interventions  Oral motor exercises;Cueing hierarchy;Environmental controls;Compensatory techniques;Cognitive reorganization;Functional tasks;SLP instruction and feedback;Patient/family education;Internal/external aids;Aspiration precaution training;Pharyngeal strengthening exercises;Diet toleration management by SLP;Trials of upgraded texture/liquids    Potential to Achieve Goals  Good    Potential Considerations  Ability to learn/carryover information;Severity of impairments    SLP Home Exercise Plan  Loud Hey and Ah; PHoRTE    Consulted and Agree with Plan of Care  Patient       Patient will benefit from skilled therapeutic intervention in order to improve the following deficits and impairments:   Dysarthria and anarthria  Aphasia  Cognitive communication deficit  Dysphagia, oropharyngeal phase    Problem List Patient Active Problem List   Diagnosis Date Noted  . Slow transit constipation   . Benign prostatic hyperplasia with urinary retention   . Right hemiparesis ([place])   . Chronic right shoulder pain   . Primary osteoarthritis of right shoulder   . Left basal ganglia embolic stroke ([place]) 86/57/8469  .  CVA (cerebral vascular accident) ([facility]) 08/31/2018  . Elevated aspartate aminotransferase level 06/16/2015  . H/O nutritional disorder 06/16/2015  . Excessive salivation 06/16/2015  . 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 Heart palpitations 06/16/2015  . 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 Screening for prostate cancer 08/21/2014  . Atypical chest pain 08/10/2013  . 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 [place], [place], [address] Phone: [phone]   Fax:  [phone]   Name: DORRELL MITCHELTREE MRN: [id number] Date of Birth: [birth year]

## 2018-12-04 ENCOUNTER — Encounter: Payer: Self-pay | Admitting: Occupational Therapy

## 2018-12-04 ENCOUNTER — Ambulatory Visit: Payer: BC Managed Care – PPO | Admitting: Physical Therapy

## 2018-12-04 ENCOUNTER — Ambulatory Visit: Payer: BC Managed Care – PPO

## 2018-12-04 ENCOUNTER — Ambulatory Visit: Payer: BC Managed Care – PPO | Admitting: Occupational Therapy

## 2018-12-04 ENCOUNTER — Other Ambulatory Visit: Payer: Self-pay

## 2018-12-04 ENCOUNTER — Ambulatory Visit: Payer: BLUE CROSS/BLUE SHIELD | Admitting: Physical Therapy

## 2018-12-04 ENCOUNTER — Encounter: Payer: BLUE CROSS/BLUE SHIELD | Admitting: Occupational Therapy

## 2018-12-04 DIAGNOSIS — R29818 Other symptoms and signs involving the nervous system: Secondary | ICD-10-CM

## 2018-12-04 DIAGNOSIS — R2681 Unsteadiness on feet: Secondary | ICD-10-CM

## 2018-12-04 DIAGNOSIS — I69351 Hemiplegia and hemiparesis following cerebral infarction affecting right dominant side: Secondary | ICD-10-CM

## 2018-12-04 DIAGNOSIS — M79641 Pain in right hand: Secondary | ICD-10-CM

## 2018-12-04 DIAGNOSIS — M6281 Muscle weakness (generalized): Secondary | ICD-10-CM

## 2018-12-04 DIAGNOSIS — R471 Dysarthria and anarthria: Secondary | ICD-10-CM

## 2018-12-04 DIAGNOSIS — R293 Abnormal posture: Secondary | ICD-10-CM

## 2018-12-04 DIAGNOSIS — R41841 Cognitive communication deficit: Secondary | ICD-10-CM

## 2018-12-04 DIAGNOSIS — R2689 Other abnormalities of gait and mobility: Secondary | ICD-10-CM

## 2018-12-04 DIAGNOSIS — I69318 Other symptoms and signs involving cognitive functions following cerebral infarction: Secondary | ICD-10-CM

## 2018-12-04 DIAGNOSIS — M25511 Pain in right shoulder: Secondary | ICD-10-CM

## 2018-12-04 DIAGNOSIS — R1312 Dysphagia, oropharyngeal phase: Secondary | ICD-10-CM

## 2018-12-04 DIAGNOSIS — R4701 Aphasia: Secondary | ICD-10-CM

## 2018-12-04 NOTE — Therapy (Signed)
[facility] [address] [place], [place], [address] Phone: [phone]   Fax:  [phone]  Occupational Therapy Treatment  Patient Details  Name: Edward Glass MRN: [id number] Date of Birth: April 07, 1956 Referring Provider (OT): Dr. Alysia Penna   Encounter Date: 12/04/2018  OT End of Session - 12/04/18 1328    Visit Number  16    Number of Visits  24    Date for OT Re-Evaluation  12/28/18    Authorization Type  [employer] - has 30 visits between OT and PT however if visits occur on same day counts as one visit. Pt wishes to use 23 visits and then save remaining visits for later in the year.    Authorization - Visit Number  17   total visit count is one more than OT visit count as OT and PT eval were not on the same day   Authorization - Number of Visits  24    OT Start Time  1102    OT Stop Time  1145    OT Time Calculation (min)  43 min    Activity Tolerance  Patient tolerated treatment well    Behavior During Therapy  WFL for tasks assessed/performed       Past Medical History:  Diagnosis Date  . Atypical chest pain 08/10/2013  . Elevated aspartate aminotransferase level 06/16/2015  . Excessive salivation 06/16/2015  . H/O nutritional disorder 06/16/2015  . 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 Screening for prostate cancer 08/21/2014  . 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 HERNIA REPAIR  2006  . left tricep surgery    . LOOP RECORDER INSERTION N/A 09/04/2018   Procedure: LOOP RECORDER INSERTION;  Surgeon: Evans Lance, MD;  Location: [facility];  Service: Cardiovascular;  Laterality: N/A;  . TEE WITHOUT CARDIOVERSION N/A 09/04/2018   Procedure: TRANSESOPHAGEAL ECHOCARDIOGRAM (TEE);  Surgeon: Jerline Pain, MD;  Location: [facility];  Service: Cardiovascular;  Laterality: N/A;    There were no vitals filed for this  visit.  Subjective Assessment - 12/04/18 1110    Subjective   I am supposed to get another cortisone shot this week for my shoulder    Patient is accompanied by:  Family member   Girlfriend   Pertinent History  L CVA on 09/14/18  Loop recorder, pt taking blood thinner    Patient Stated Goals  walk better and use my hand    Currently in Pain?  Yes    Pain Score  4     Pain Location  Shoulder    Pain Orientation  Right    Pain Descriptors / Indicators  Aching    Pain Type  Chronic pain    Pain Onset  More than a month ago    Pain Frequency  Intermittent    Aggravating Factors   moving certain ways    Pain Relieving Factors  rest, avoiding those movements                   OT Treatments/Exercises (OP) - 12/04/18 0001      Neurological Re-education Exercises   Other Exercises 1  Neuro re ed in supine to address closed chain mid to beginning overhead reach in closed chain activity - pt needing mod faciliation as pt is apraxic and engages in elbow flexion/shoulder extension when attempting any RUE movement. Also addresed isolated elbow flexion/extension in  supine with RUE at 90* of shoulder flexion with mod cues, min facilitation.  Transitioned into sitting and discussed with pt and girlfriend the importance of pt beginning to incorporate RUE into functional tasks. Had pt practice bathing as well as putting on and taking off jacket while actively engaging RUE (vs [place] just using LUE to dress the RUE). With cues, pt was able to use his R hand to unzip his jacket, pull jacket sleeve off of L hand using R hand and actively extend RUE while taking jacket off. Pt also able to actively put his RUE into jacket sleeve,  Disussed that pt will need cues to incoporate RUE as even during the session with focus on this pt repeatedly attempted to all activities with his RUE.  Will continue to monitor. Also addressed RUE as stablizer in side sitting with moving body on arm for reaching activities with  LUE. Transitioned into sitting and addressed closed chain on moveable surface for low to beginning mid reach using UE ranger. Progressed to functional low reach to work on reach with grasp and release for RUE.  Pt needs mod facilitation and cues to open and close hand. discussed remaining visits and pt wishes to do 2x/wk x3 more week and then reserve remaining visits for later in the year for PT and OT.                OT Short Term Goals - 12/04/18 1325      OT SHORT TERM GOAL #1   Title  Pt and family will be mod I  with RUE positioning/care to address edema and pain in RUE - 11/23/2018 (goal adjusted as pt missed one week)    Status  Achieved      OT SHORT TERM GOAL #2   Title  Pt and family will be mod I with splint wear and care    Status  Achieved      OT SHORT TERM GOAL #3   Title  Pt and dtr will be mod I with HEP for RUE for ROM, addressing pain, functional use of RUE prn    Status  Achieved      OT SHORT TERM GOAL #4   Title  Pt will report pain in R hand/wrist no greater than 5/10 during HEP.      Status  Achieved      OT SHORT TERM GOAL #5   Title  Pt will demonstrate the ability for 55* of shoulder flexion as prep for functional low reach    Status  On-going   45-50     OT SHORT TERM GOAL #6   Title  Pt will be mod I with toilet and tub bench transfers    Status  Achieved   met per pt report     OT SHORT TERM GOAL #7   Title  Pt will be mod I with bathing at shower level    Status  Achieved   met per pt report       OT Long Term Goals - 12/04/18 1325      OT LONG TERM GOAL #1   Title  Pt and family will be mod I with upgraded home activities program - 01/05/2019 (goal adjusted as pt missed one week)    Status  On-going      OT LONG TERM GOAL #2   Title  Pt will demonstrate ability to grasp and hold light weight object in R hand with pain 2/10 or less  Status  Achieved      OT LONG TERM GOAL #3   Title  Pt will demonstrate ability for bilateral  low reach to obtain light weigh object     Status  On-going      OT LONG TERM GOAL #4   Title  Pt will be mod I with dressing    Status  On-going      OT LONG TERM GOAL #5   Title  Pt will be mod I with cutting food, AE prn    Status  On-going      OT LONG TERM GOAL #6   Title  Pt will demonstrate ability to use RUE as stabilizer at least 75% of the time in simple ADL tasks    Status  On-going      OT LONG TERM GOAL #7   Title  Pt will be mod I with simple snack/be prep at ambulatory level    Status  On-going      OT LONG TERM GOAL #8   Title  Pt will demonstrate improved postural alignment to decrease pain in RUE as well as decrease risk of falls    Status  On-going            Plan - 12/04/18 1326    Clinical Impression Statement  Pt with slow progress toward goals. Pt practiced using RUE during simulated bathing and dressing activities.     Occupational performance deficits (Please refer to evaluation for details):  ADL's;IADL's;Rest and Sleep;Work;Leisure;Social Participation    Body Structure / Function / Physical Skills  ADL;Balance;Decreased knowledge of use of DME;Coordination;Edema;GMC;FMC;Flexibility;IADL;Mobility;Pain;ROM;Strength;Tone;UE functional use;Endurance    Cognitive Skills  Safety Awareness    Rehab Potential  Good    Clinical Decision Making  Multiple treatment options, significant modification of task necessary    Comorbidities Affecting Occupational Performance:  Presence of comorbidities impacting occupational performance    Comorbidities impacting occupational performance description:  HTN, atypical chest pain, chronic R shoulder pain, OA of R shoulder, aphasia, apraxia, poor sensation    Modification or Assistance to Complete Evaluation   Max significant modification of tasks or assist is necessary to complete    OT Frequency  2x / week    OT Duration  12 weeks    OT Treatment/Interventions  Self-care/ADL training;Aquatic  Therapy;Cryotherapy;Therapeutic exercise;Neuromuscular education;Energy conservation;DME and/or AE instruction;Functional Mobility Training;Cognitive remediation/compensation;Balance training;Splinting;Manual Therapy;Passive range of motion;Therapeutic activities;Patient/family education    Plan   continue NMR RUE/trunk/functional mobility, begin to address LTG's    Consulted and Agree with Plan of Care  Patient    Family Member Consulted  girlfriend       Patient will benefit from skilled therapeutic intervention in order to improve the following deficits and impairments:  Body Structure / Function / Physical Skills, Cognitive Skills  Visit Diagnosis: Other symptoms and signs involving the nervous system  Muscle weakness (generalized)  Hemiplegia and hemiparesis following cerebral infarction affecting right dominant side (HCC)  Acute pain of right shoulder  Abnormal posture  Unsteadiness on feet  Pain in right hand  Other symptoms and signs involving cognitive functions following cerebral infarction    Problem List Patient Active Problem List   Diagnosis Date Noted  . Slow transit constipation   . Benign prostatic hyperplasia with urinary retention   . Right hemiparesis ([place])   . Chronic right shoulder pain   . Primary osteoarthritis of right shoulder   . Left basal ganglia embolic stroke ([place]) 24/23/5361  . CVA (cerebral vascular accident) (  [place]) 08/31/2018  . Elevated aspartate aminotransferase level 06/16/2015  . H/O nutritional disorder 06/16/2015  . Excessive salivation 06/16/2015  . 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 Heart palpitations 06/16/2015  . 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 Screening for prostate cancer 08/21/2014  . Atypical chest pain 08/10/2013  . 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

## 2018-12-04 NOTE — Patient Instructions (Addendum)
Signs of Aspiration Pneumonia   . Chest pain/tightness . Fever (can be low grade) . Cough  o With foul-smelling phlegm (sputum) o With sputum containing pus or blood o With greenish sputum . Fatigue  . Shortness of breath  . Wheezing   **IF YOU HAVE THESE SIGNS, CONTACT YOUR DOCTOR OR GO TO THE EMERGENCY DEPARTMENT OR URGENT CARE AS SOON AS POSSIBLE**     SLP provided patient instructions from 10-26-18 with PHorte exercises and reiterated BID completion

## 2018-12-04 NOTE — Therapy (Signed)
[facility] [address] [place], [place], [address] Phone: [phone]   Fax:  [phone]  Speech Language Pathology Treatment  Patient Details  Name: Edward Glass MRN: [id number] Date of Birth: [birth year] Referring Provider (SLP): Alysia Penna, MD   Encounter Date: 12/04/2018  End of Session - 12/04/18 1514    Visit Number  13    Number of Visits  21    Date for SLP Re-Evaluation  12/27/18    Authorization Type  30 visits ST (20 planned, save 10?)    Authorization - Visit Number  13    Authorization - Number of Visits  30    SLP Start Time  0354    SLP Stop Time   1240    SLP Time Calculation (min)  49 min    Activity Tolerance  Patient tolerated treatment well       Past Medical History:  Diagnosis Date  . Atypical chest pain 08/10/2013  . Elevated aspartate aminotransferase level 06/16/2015  . Excessive salivation 06/16/2015  . H/O nutritional disorder 06/16/2015  . 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 Screening for prostate cancer 08/21/2014  . 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 HERNIA REPAIR  2006  . left tricep surgery    . LOOP RECORDER INSERTION N/A 09/04/2018   Procedure: LOOP RECORDER INSERTION;  Surgeon: Evans Lance, MD;  Location: [facility];  Service: Cardiovascular;  Laterality: N/A;  . TEE WITHOUT CARDIOVERSION N/A 09/04/2018   Procedure: TRANSESOPHAGEAL ECHOCARDIOGRAM (TEE);  Surgeon: Jerline Pain, MD;  Location: [facility] ENDOSCOPY;  Service: Cardiovascular;  Laterality: N/A;    There were no vitals filed for this visit.  Subjective Assessment - 12/04/18 1241    Subjective  Pt arrived from OT 5 minutes late. "As far as memory goes I'm thinking just the way I always have."            ADULT SLP TREATMENT - 12/04/18 1208      General Information   Behavior/Cognition  Alert;Cooperative      Treatment Provided   Treatment provided  Cognitive-Linquistic      Cognitive-Linquistic Treatment   Treatment focused on  Cognition    Skilled Treatment  Pt req'd mod-max A with organization/attention for naming separate components of common items (e.g., foot, bicycle, book, tree). Immediately following this task, pt's friend Dena told SLP pt is most concerned with what interests him. SLP explained that pt, prior to his last CVA, pt would have very likely been able to organize and attend to task long enough to complete without cues. Pt with persistent deficits re: intellectual awareness of deficits vs. a facet of pt's personality - pt only stated physical deficits following CVA. SLP had to repeat the question x2 due to pt decr'd sustained attention to remain on topic. Next, when asked about his speech after CVA, pt rated his speech 8/10 after his latest CVA -where SLP explained 10= premorbid speech clarity. Pt's friend told SLP pt had what was best described as mild dysarthria (pt "muttered" after his last CVA) requiring her to sometimes ask pt to repeat. SLP asked pt if he would like to focus on the clarity of his speech, as it appears that he believes his thinking and word finding skills are at baseline. Pt affirmed this was true and he wanted to target speech clarity. ST to focus on pt's speech clarity and  defer other goals related to cognitive linguistics and aphasia. SLP proivded pt with PHortE exercises from 10-26-18 and reminded pt to complete BID - friend said pt has been performing these exercises daily.          Assessment / Recommendations / Plan   Plan  Other (Comment)   cognitive linguistic and aphasia goals deferred     Progression Toward Goals   Progression toward goals  --   goals updated as stated in "skilled intervention"      SLP Education - 12/04/18 1514    Education Details  deficit areas, PhortE exercises    Person(s) Educated  Patient;Other (comment)    Methods   Explanation;Handout    Comprehension  Need further instruction       SLP Short Term Goals - 12/01/18 1313      SLP SHORT TERM GOAL #1   Title  Pt will undergo naming assessment and/or cognitive linguistic assessment PRN    Baseline  BNT: 10-24-18, [facility] 11-08-18    Status  Achieved      SLP SHORT TERM GOAL #2   Title  pt will demo improved conversational volume in sentence responses- average mid-upper 60s dB, over two sessions    Status  Not Met      SLP SHORT TERM GOAL #3   Title  pt will demo improved conversational volume in 5 mintues simple conversation with average mid-upper 60s dB, over three sessions    Status  Not Met      SLP SHORT TERM GOAL #4   Title  pt will demo abdominal breathing 60% of the time at rest over two sessions    Baseline  11-29-18, 12-01-18    Status  Achieved   from 'in 5 minutes conversation over three sessions"     SLP SHORT TERM GOAL #5   Title  pt will name 3 non-physical deficits over three sessions    Status  Not Met      SLP SHORT TERM GOAL #6   Title  pt/family will provide 3 overt s/s aspiration PNA with modified independence over 2 sessions    Time  1    Period  Weeks    Status  Deferred      SLP SHORT TERM GOAL #7   Title  pt will complete simple to mod complex naming tasks pertinent to pt's interests and life experience/s 85% over three sessions    Status  Not Met       SLP Long Term Goals - 12/04/18 1150      SLP LONG TERM GOAL #1   Title  pt will demo improved conversational volume in 8 minutes simple conversation average upper 60s-low 70s dB, over three sessions    Time  4    Period  Weeks   or 21 total visits, for all LTGs   Status  On-going      SLP LONG TERM GOAL #2   Title  pt will demo abdominal breathing 70% of the time in 5 minutes conversation over three sessions    Time  4    Period  Weeks    Status  On-going      SLP LONG TERM GOAL #3   Title  pt will complete a functional, simple organizational task for 25  minutes demonstrating adequate selelctive attention with modified indpendence, over three sessions    Time  5    Period  Weeks    Status  Deferred  SLP LONG TERM GOAL #4   Title  pt will double check answers with speech therapy tasks (anticipatory awareness) with nonverbal cues over three sessions    Time  4    Status  Deferred      SLP LONG TERM GOAL #5   Title  SLP will monitor pt to foster cont'd pulmonary health    Time  4    Period  Weeks    Status  On-going      SLP LONG TERM GOAL #6   Title  pt will demo functional word finding skills in 8 minutes simple conversation for WNL conversational fluidity over 3 sessions    Time  4    Period  Weeks    Status  Deferred      SLP LONG TERM GOAL #7   Title  pt will demo compensations for memory in 80% of opportunities    Time  4    Period  Weeks    Status  Deferred       Plan - 12/04/18 1515    Clinical Impression Statement  Moderate dysarthria persists. Pt's friend Coralyn Helling stated today pt "muttered" after his last CVA - pt rated his current speech clarity level today at 46/10 where 10/10 = speech clarity prior to latest CVA. SLP cont'd to observe pt's aphasia and cognitive deficits today. Pt intellectual awareness remains decr'd re: cognitive-linguisitic deficits, and/or it is pt's personality to explain away or not accept cognitive and anomic errors and deficits. Pt would like to focus on speech clarity as he believes cognitive linguistic and word finding skills are at baseline. Pt would benefit from skilled ST to maximize intelligiblity, language, and cognitive commuication skills.    Speech Therapy Frequency  2x / week    Duration  --   10 weeks or 21 visits   Treatment/Interventions  Oral motor exercises;Cueing hierarchy;Environmental controls;Compensatory techniques;Cognitive reorganization;Functional tasks;SLP instruction and feedback;Patient/family education;Internal/external aids;Aspiration precaution training;Pharyngeal  strengthening exercises;Diet toleration management by SLP;Trials of upgraded texture/liquids    Potential to Achieve Goals  Good    Potential Considerations  Ability to learn/carryover information;Severity of impairments    SLP Home Exercise Plan  [place] [place] [employer] [place]; PHoRTE    Consulted and Agree with Plan of Care  Patient       Patient will benefit from skilled therapeutic intervention in order to improve the following deficits and impairments:   Cognitive communication deficit  Dysarthria and anarthria  Aphasia  Dysphagia, oropharyngeal phase    Problem List Patient Active Problem List   Diagnosis Date Noted  . Slow transit constipation   . Benign prostatic hyperplasia with urinary retention   . Right hemiparesis ([place])   . Chronic right shoulder pain   . Primary osteoarthritis of right shoulder   . Left basal ganglia embolic stroke ([place]) 03/88/8280  . CVA (cerebral vascular accident) ([place]) 08/31/2018  . Elevated aspartate aminotransferase level 06/16/2015  . H/O nutritional disorder 06/16/2015  . Excessive salivation 06/16/2015  . 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 Heart palpitations 06/16/2015  . 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 Screening for prostate cancer 08/21/2014  . Atypical chest pain 08/10/2013  . 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 [place], [place], [address] Phone: [phone]   Fax:  [phone]   Name: Edward Glass MRN: [id number] Date of Birth: [birth year]

## 2018-12-05 ENCOUNTER — Ambulatory Visit: Payer: BLUE CROSS/BLUE SHIELD | Admitting: Physical Therapy

## 2018-12-05 NOTE — Therapy (Signed)
[facility] [address] [place], [place], [address] Phone: [phone]   Fax:  [phone]  Physical Therapy Treatment  Patient Details  Name: Edward Glass MRN: [id number] Date of Birth: 07-03-1956 Referring Provider (PT): Alysia Penna MD  CLINIC OPERATION CHANGES: [facility] is operating at a low capacity due to COVID-19.  The patient was brought into the clinic for evaluation and/or treatment  following universal masking by staff, social distancing, and <10 people in the clinic.  The patient's COVID risk of complications score is 3.   Encounter Date: 12/04/2018  PT End of Session - 12/05/18 1028    Visit Number  16   total visit count for PT and OT 17/30   Number of Visits  21   pt wishes to cont with 2x/week x 3 wks for 6 additional visits only due to visit limitation of 30 for both PT & OT   Date for PT Re-Evaluation  01/04/19    Authorization Type  BCBS; met $7500 deductible; VL: PT/OT 30, zero used; seen on same day =1 visit; SLP 30 visits    Authorization Time Period  VL IS A HARD MAX    Authorization - Visit Number  17    Authorization - Number of Visits  30    PT Start Time  1017    PT Stop Time  1101    PT Time Calculation (min)  44 min    Equipment Utilized During Treatment  Gait belt    Activity Tolerance  Patient tolerated treatment well    Behavior During Therapy  WFL for tasks assessed/performed       Past Medical History:  Diagnosis Date  . Atypical chest pain 08/10/2013  . Elevated aspartate aminotransferase level 06/16/2015  . Excessive salivation 06/16/2015  . H/O nutritional disorder 06/16/2015  . 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 Screening for prostate cancer 08/21/2014  . 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 HERNIA REPAIR  2006  . left tricep surgery    . LOOP RECORDER  INSERTION N/A 09/04/2018   Procedure: LOOP RECORDER INSERTION;  Surgeon: Evans Lance, MD;  Location: [facility];  Service: Cardiovascular;  Laterality: N/A;  . TEE WITHOUT CARDIOVERSION N/A 09/04/2018   Procedure: TRANSESOPHAGEAL ECHOCARDIOGRAM (TEE);  Surgeon: Jerline Pain, MD;  Location: [facility];  Service: Cardiovascular;  Laterality: N/A;    There were no vitals filed for this visit.  Subjective Assessment - 12/05/18 1019    Subjective  Pt reports he hopes to move back into his home next week; states he has steps - flight with landing between 1st 5 or 6; Friend, Dena, states they may have to get some nonstick material to put on the steps    Patient is accompained by:  --   friend Dena   Pertinent History  chronic rt shoulder pain; had recent fall with rt wrist pain, xray negative for fracture and had steroid injection 09/27/18    Limitations  Walking    Diagnostic tests  MRI-Multiple small foci of acute/early subacute infarctions present in the left MCA distribution concentrated in left basal ganglia; Small chronic infarcts in right parietal and occipital lobes    Patient Stated Goals  improve his walking and balance, be able to live on his own again in his 2 story house  [facility] Adult PT Treatment/Exercise - 12/05/18 0001      Ambulation/Gait   Ambulation/Gait  Yes    Ambulation/Gait Assistance  4: Min assist    Ambulation/Gait Assistance Details  tactile cues for Rt pelvic protraction and verbal cues to weight shift and load [name] in stance; cues for upright posture, to look up and to bring Lt pelvis forward before stepping with [name]     Ambulation Distance (Feet)  230 Feet    Assistive device  None   Gait training with no device    Gait Pattern  Step-through pattern;Decreased stance time - right;Decreased hip/knee flexion - right;Decreased weight shift to right    Ambulation Surface  Level;Indoor      Neuro Re-ed    Neuro Re-ed  Details   Rockerboard placed inside // bars - pt stood on board - tapped floor in front with LLE to facilitate Rt anterior weight shift and Rt ankle AROM - mod assist with this activity      Knee/Hip Exercises: Standing   Forward Step Up  Right;1 set;10 reps;Hand Hold: 1;Step Height: 6"    Step Down  Right;1 set;10 reps;Hand Hold: 1;Step Height: 6"   min assist with this exercise   Other Standing Knee Exercises  slow tap ups to 6" step with LLE for [name] weight shift and for closed chain strengthening - CGA to min assist for balance        Pt performed stepping over and back of black balance beam inside // bars with LLE with LUE support for [name] weight shift  And for improved Rt knee control - min guard assist with this activity         PT Short Term Goals - 11/06/18 1016      PT SHORT TERM GOAL #1   Title  Patient will be independent with HEP (Target all STGs 4 weeks 11/02/2018)    Baseline  met 11-06-18 per pt report    Status  Achieved      PT SHORT TERM GOAL #5   Title  Patient will ambulate on ramp and up/down curb with close-guarding and LRAD    Baseline  pt requires min assist with curb & ramp negotiation with [facility] for safety - 11-06-18    Time  4    Period  Weeks    Status  Not Met        PT Long Term Goals - 12/05/18 1043      PT LONG TERM GOAL #1   Title  Patient will be independent with updated HEP and verbalize plan for continued community-based exercise plan.    Baseline  11/27/18: met with current HEP, will plan to progress/update in next session or 2.    Time  3    Period  Weeks    Status  On-going    Target Date  01/04/19      PT LONG TERM GOAL #2   Title  Patient will improve gait velocity to 1.30 ft/sec demonstrating progress towards decr fall risk    Baseline  11/27/18: 1.06 ft/sec with RW with supervision, cues for gait mechanics; decr'd from assessment last week on 11/24/18    Time  3    Period  Weeks    Status  On-going    Target Date  01/04/19      PT  LONG TERM GOAL #3   Title  Pt will amb. short distances (approx. 20') without device for household amb. with LUE support on environmental  objects as needed for assist with balance.     Time  3    Period  Weeks    Status  New    Target Date  01/04/19      PT LONG TERM GOAL #4   Title  Patient will ambulate over outdoor paved surfaces modified independent with LRAD, including up/down curb.     Baseline  11/27/18: min guard to min assist on paved surfaces, did not assess curb     Time  3    Period  Weeks    Status  On-going    Target Date  01/04/19      PT LONG TERM GOAL #6   Title  Patient will go up/down 12 steps with single rail modified independent     Baseline  min guard/CGA needed for safety - 11-29-18; Lt hand rail used    Time  3    Period  Weeks    Status  On-going    Target Date  01/04/19            Plan - 12/05/18 1032    Clinical Impression Statement  Pt progressing with increasing strength in [name] with improved Rt knee control noted in stance with no Rt knee hyperextension noted during today's PT session.  Gait without device to facilitate Rt weight shift and loading is improving with increased gait speed and improved [name] weight shift in Rt single limb stance noted.  Pt wishes to continue PT at twice/week for 3 weeks for total of 6 visits to conserve limited visits; recommend 1x/week for 6 weeks to maximize time but pt wishes to remain at 2x/week.      Personal Factors and Comorbidities  Comorbidity 1    Comorbidities  chronic rt shoulder pain    Examination-Activity Limitations  Bathing;Bed Mobility;Caring for Others;Carry;Dressing;Locomotion Level;Lift;Reach Overhead;Self Feeding;Stairs    Examination-Participation Restrictions  Community Activity;Driving    Stability/Clinical Decision Making  Stable/Uncomplicated    Rehab Potential  Good    PT Frequency  2x / week    PT Duration  8 weeks    PT Treatment/Interventions  ADLs/Self Care Home Management;Aquatic  Therapy;Electrical Stimulation;DME Instruction;Gait training;Stair training;Functional mobility training;Therapeutic activities;Therapeutic exercise;Balance training;Orthotic Fit/Training;Patient/family education;Neuromuscular re-education;Wheelchair mobility training;Passive range of motion;Visual/perceptual remediation/compensation    PT Next Visit Plan  Cont gait and balance training - have done gait training without device;  Bioness for [name] (hamstring/ant tib)., tall kneeling/half kneeling, standing weight shifting, R motor control.  Exercises with Bioness/standing rotation/reaching at counter top-increased use of RUE    Consulted and Agree with Plan of Care  Patient    Family Member Consulted  friend - Dena       Patient will benefit from skilled therapeutic intervention in order to improve the following deficits and impairments:  Abnormal gait, Decreased activity tolerance, Decreased balance, Decreased endurance, Decreased coordination, Decreased cognition, Decreased knowledge of use of DME, Decreased mobility, Decreased safety awareness, Difficulty walking, Decreased strength, Increased edema, Impaired perceived functional ability, Impaired UE functional use  Visit Diagnosis: Muscle weakness (generalized) - Plan: PT plan of care cert/re-cert  Hemiplegia and hemiparesis following cerebral infarction affecting right dominant side ([place]) - Plan: PT plan of care cert/re-cert  Other abnormalities of gait and mobility - Plan: PT plan of care cert/re-cert  Unsteadiness on feet - Plan: PT plan of care cert/re-cert     Problem List Patient Active Problem List   Diagnosis Date Noted  . Slow transit constipation   . Benign prostatic hyperplasia with  urinary retention   . Right hemiparesis ([place])   . Chronic right shoulder pain   . Primary osteoarthritis of right shoulder   . Left basal ganglia embolic stroke ([place]) 05/22/2535  . CVA (cerebral vascular accident) ([place]) 08/31/2018  . Elevated  aspartate aminotransferase level 06/16/2015  . H/O nutritional disorder 06/16/2015  . Excessive salivation 06/16/2015  . 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 Heart palpitations 06/16/2015  . 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 Screening for prostate cancer 08/21/2014  . Atypical chest pain 08/10/2013  . 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 [place], [place], [address] Phone: [phone]   Fax:  [phone]  Name: HANIF RADIN MRN: [id number] Date of Birth: [birth year]

## 2018-12-06 ENCOUNTER — Other Ambulatory Visit: Payer: Self-pay

## 2018-12-06 ENCOUNTER — Ambulatory Visit: Payer: BC Managed Care – PPO | Admitting: Physical Therapy

## 2018-12-06 ENCOUNTER — Encounter: Payer: Self-pay | Admitting: Physical Therapy

## 2018-12-06 ENCOUNTER — Ambulatory Visit: Payer: BC Managed Care – PPO | Admitting: Occupational Therapy

## 2018-12-06 ENCOUNTER — Ambulatory Visit: Payer: BC Managed Care – PPO

## 2018-12-06 DIAGNOSIS — R4701 Aphasia: Secondary | ICD-10-CM

## 2018-12-06 DIAGNOSIS — R29818 Other symptoms and signs involving the nervous system: Secondary | ICD-10-CM

## 2018-12-06 DIAGNOSIS — R41841 Cognitive communication deficit: Secondary | ICD-10-CM

## 2018-12-06 DIAGNOSIS — R2681 Unsteadiness on feet: Secondary | ICD-10-CM

## 2018-12-06 DIAGNOSIS — R1312 Dysphagia, oropharyngeal phase: Secondary | ICD-10-CM

## 2018-12-06 DIAGNOSIS — R2689 Other abnormalities of gait and mobility: Secondary | ICD-10-CM

## 2018-12-06 DIAGNOSIS — M6281 Muscle weakness (generalized): Secondary | ICD-10-CM

## 2018-12-06 DIAGNOSIS — I69351 Hemiplegia and hemiparesis following cerebral infarction affecting right dominant side: Secondary | ICD-10-CM

## 2018-12-06 DIAGNOSIS — R471 Dysarthria and anarthria: Secondary | ICD-10-CM

## 2018-12-06 NOTE — Therapy (Signed)
[facility] [address] [place], [place], [address] Phone: [phone]   Fax:  [phone]  Occupational Therapy Treatment  Patient Details  Name: Edward Glass MRN: [id number] Date of Birth: 04-Feb-1956 Referring Provider (OT): Dr. Alysia Penna   Encounter Date: 12/06/2018  OT End of Session - 12/06/18 1044    Visit Number  17    Number of Visits  24    Date for OT Re-Evaluation  12/28/18    Authorization Type  BCBS - has 30 visits between OT and PT however if visits occur on same day counts as one visit. Pt wishes to use 23 visits and then save remaining visits for later in the year.    Authorization - Visit Number  18    Authorization - Number of Visits  24    OT Start Time  1018    OT Stop Time  1100    OT Time Calculation (min)  42 min    Activity Tolerance  Patient tolerated treatment well    Behavior During Therapy  WFL for tasks assessed/performed       Past Medical History:  Diagnosis Date  . Atypical chest pain 08/10/2013  . Elevated aspartate aminotransferase level 06/16/2015  . Excessive salivation 06/16/2015  . H/O nutritional disorder 06/16/2015  . 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 Screening for prostate cancer 08/21/2014  . 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 HERNIA REPAIR  2006  . left tricep surgery    . LOOP RECORDER INSERTION N/A 09/04/2018   Procedure: LOOP RECORDER INSERTION;  Surgeon: Evans Lance, MD;  Location: [facility];  Service: Cardiovascular;  Laterality: N/A;  . TEE WITHOUT CARDIOVERSION N/A 09/04/2018   Procedure: TRANSESOPHAGEAL ECHOCARDIOGRAM (TEE);  Surgeon: Jerline Pain, MD;  Location: [facility] END[facility]COPY;  Service: Cardiovascular;  Laterality: N/A;    There were no vitals filed for this visit.  Subjective Assessment - 12/06/18 1044    Pertinent History  L CVA on 09/14/18  Loop  recorder, pt taking blood thinner    Patient Stated Goals  walk better and use my hand    Currently in Pain?  No/denies            Treatment:.Pt was treated in the clinic today with universal masking by staff, and pt masking with reduced number of patients in the clinic due to COVID-19.  supine gentle joint mobs to right shoulder followed by lower trunk rotation and RUE shoulder abduction, min-mod facilitation Weightbearing through right elbow and hand  hand with body on arm movements lateral trunk flexion, rotation  and weight shifts,min- mod facilitation. 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                 OT Short Term Goals - 12/04/18 1325      OT SHORT TERM GOAL #1   Title  Pt and family will be mod I  with RUE positioning/care to address edema and pain in RUE - 11/23/2018 (goal adjusted as pt missed one week)    Status  Achieved      OT SHORT TERM GOAL #  2   Title  Pt and family will be mod I with splint wear and care    Status  Achieved      OT SHORT TERM GOAL #3   Title  Pt and dtr will be mod I with HEP for RUE for ROM, addressing pain, functional use of RUE prn    Status  Achieved      OT SHORT TERM GOAL #4   Title  Pt will report pain in R hand/wrist no greater than 5/10 during HEP.      Status  Achieved      OT SHORT TERM GOAL #5   Title  Pt will demonstrate the ability for 55* of shoulder flexion as prep for functional low reach    Status  On-going   45-50     OT SHORT TERM GOAL #6   Title  Pt will be mod I with toilet and tub bench transfers    Status  Achieved   met per pt report     OT SHORT TERM GOAL #7   Title  Pt will be mod I with  bathing at shower level    Status  Achieved   met per pt report       OT Long Term Goals - 12/04/18 1325      OT LONG TERM GOAL #1   Title  Pt and family will be mod I with upgraded home activities program - 01/05/2019 (goal adjusted as pt missed one week)    Status  On-going      OT LONG TERM GOAL #2   Title  Pt will demonstrate ability to grasp and hold light weight object in R hand with pain 2/10 or less    Status  Achieved      OT LONG TERM GOAL #3   Title  Pt will demonstrate ability for bilateral low reach to obtain light weigh object     Status  On-going      OT LONG TERM GOAL #4   Title  Pt will be mod I with dressing    Status  On-going      OT LONG TERM GOAL #5   Title  Pt will be mod I with cutting food, AE prn    Status  On-going      OT LONG TERM GOAL #6   Title  Pt will demonstrate ability to use RUE as stabilizer at least 75% of the time in simple ADL tasks    Status  On-going      OT LONG TERM GOAL #7   Title  Pt will be mod I with simple snack/be prep at ambulatory level    Status  On-going      OT LONG TERM GOAL #8   Title  Pt will demonstrate improved postural alignment to decrease pain in RUE as well as decrease risk of falls    Status  On-going              Patient will benefit from skilled therapeutic intervention in order to improve the following deficits and impairments:     Visit Diagnosis: Muscle weakness (generalized)  Hemiplegia and hemiparesis following cerebral infarction affecting right dominant side ([facility])  Other abnormalities of gait and mobility  Unsteadiness on feet    Problem List Patient Active Problem List   Diagnosis Date Noted  . Slow transit constipation   . Benign prostatic hyperplasia with urinary retention   . Right hemiparesis ([place])   .  Chronic right shoulder pain   . Primary osteoarthritis of right shoulder   . Left basal ganglia embolic stroke ([place]) 17/49/4496  . CVA (cerebral vascular accident) ([place])  08/31/2018  . Elevated aspartate aminotransferase level 06/16/2015  . H/O nutritional disorder 06/16/2015  . Excessive salivation 06/16/2015  . 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 Heart palpitations 06/16/2015  . 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 Screening for prostate cancer 08/21/2014  . Atypical chest pain 08/10/2013  . 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 [place], [place], [address] Phone: [phone]   Fax:  [phone]  Name: Edward Glass MRN: [id number] Date of Birth: 1956/05/03

## 2018-12-06 NOTE — Therapy (Signed)
[facility] [address] [place], [place], [address] Phone: [phone]   Fax:  [phone]  Speech Language Pathology Treatment  Patient Details  Name: Edward Glass MRN: [id number] Date of Birth: 28-Feb-1956 Referring Provider (SLP): Alysia Penna, MD   Encounter Date: 12/06/2018  End of Session - 12/06/18 1535    Visit Number  14    Number of Visits  21    Date for SLP Re-Evaluation  12/27/18    Authorization Type  30 visits ST (20 planned, save 10?)    Authorization - Visit Number  14    Authorization - Number of Visits  30    SLP Start Time  8366    SLP Stop Time   1230    SLP Time Calculation (min)  41 min    Activity Tolerance  Patient tolerated treatment well       Past Medical History:  Diagnosis Date  . Atypical chest pain 08/10/2013  . Elevated aspartate aminotransferase level 06/16/2015  . Excessive salivation 06/16/2015  . H/O nutritional disorder 06/16/2015  . 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 Screening for prostate cancer 08/21/2014  . 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 HERNIA REPAIR  2006  . left tricep surgery    . LOOP RECORDER INSERTION N/A 09/04/2018   Procedure: LOOP RECORDER INSERTION;  Surgeon: Evans Lance, MD;  Location: [facility];  Service: Cardiovascular;  Laterality: N/A;  . TEE WITHOUT CARDIOVERSION N/A 09/04/2018   Procedure: TRANSESOPHAGEAL ECHOCARDIOGRAM (TEE);  Surgeon: Jerline Pain, MD;  Location: [facility];  Service: Cardiovascular;  Laterality: N/A;    There were no vitals filed for this visit.  Subjective Assessment - 12/06/18 1515    Subjective  Pt's caregiver with questions about pt's brain imaging, following SLP explaining possibility for pt's decr'd voice quality/strength. Pt asked and ok'd SLP to provide information.    Currently in Pain?  No/denies             ADULT SLP TREATMENT - 12/06/18 1516      General Information   Behavior/Cognition  Alert;Cooperative   flat affect     Treatment Provided   Treatment provided  Cognitive-Linquistic      Cognitive-Linquistic Treatment   Treatment focused on  Dysarthria;Voice    Skilled Treatment  SLP discussed pt coure of therapy, visit limits. Pt wishes to spend 20 visits currently with this plan of care and save 10 ST visits in case they are needed later in the year. Pt req'd cues for procedure for "hey- ahhhhhhhhh" again. After this initial cue, pt produced short Hey with long loud /a/. Some vocal strain resulting in coughing x2/5. SLP cued pt to push with abdominal musculature and not laryngeal musculature. SLP ensured pt would have someone there to practice these PhortE exercises with him as SLP explained pt has not recalled correctly how to complete loud "hey - ah" in two sessions with SLP providing initial mod-max cues with demo during each session. SLP went through the PhorTE exercises provided to pt on 10-26-18. SLP had to totally review pitch incr and decr exercise as well as straw phonation. Pt req'd usual mod cues to use full breath/abdominal breath with reps hey-ah, pitch incr/decr, and req'd consistent mod-max cues not to rush through straw phonation exercise. SLP reiterated to pt/Dena that with his voice/speech, his consistency with this program at home is what  is going to make the difference for the change he wants to see with his voice/speech. SLP suggested the possibility that some of pt's voice weakness may not be solely due to muscle weakness. If that is the case time will be necessary for incr'd healing/rehab as well.      Assessment / Recommendations / Plan   Plan  Continue with current plan of care      Progression Toward Goals   Progression toward goals  --   pt's cognition may slow rate of progress      SLP Education - 12/06/18 1535    Education Details  PhorTE exercises     Person(s) Educated  Patient;Caregiver(s)    Methods  Explanation;Demonstration;Verbal cues    Comprehension  Verbalized understanding;Returned demonstration;Verbal cues required;Need further instruction       SLP Short Term Goals - 12/01/18 1313      SLP SHORT TERM GOAL #1   Title  Pt will undergo naming assessment and/or cognitive linguistic assessment PRN    Baseline  BNT: 10-24-18, [facility] 11-08-18    Status  Achieved      SLP SHORT TERM GOAL #2   Title  pt will demo improved conversational volume in sentence responses- average mid-upper 60s dB, over two sessions    Status  Not Met      SLP SHORT TERM GOAL #3   Title  pt will demo improved conversational volume in 5 mintues simple conversation with average mid-upper 60s dB, over three sessions    Status  Not Met      SLP SHORT TERM GOAL #4   Title  pt will demo abdominal breathing 60% of the time at rest over two sessions    Baseline  11-29-18, 12-01-18    Status  Achieved   from 'in 5 minutes conversation over three sessions"     SLP SHORT TERM GOAL #5   Title  pt will name 3 non-physical deficits over three sessions    Status  Not Met      SLP SHORT TERM GOAL #6   Title  pt/family will provide 3 overt s/s aspiration PNA with modified independence over 2 sessions    Time  1    Period  Weeks    Status  Deferred      SLP SHORT TERM GOAL #7   Title  pt will complete simple to mod complex naming tasks pertinent to pt's interests and life experience/s 85% over three sessions    Status  Not Met       SLP Long Term Goals - 12/06/18 1538      SLP LONG TERM GOAL #1   Title  pt will demo improved conversational volume in 8 minutes simple conversation average upper 60s-low 70s dB, over three sessions    Time  4    Period  Weeks   or 21 total visits, for all LTGs   Status  On-going      SLP LONG TERM GOAL #2   Title  pt will demo abdominal breathing 70% of the time in 5 minutes conversation over three sessions    Time  4     Period  Weeks    Status  On-going      SLP LONG TERM GOAL #3   Title  pt will complete a functional, simple organizational task for 25 minutes demonstrating adequate selelctive attention with modified indpendence, over three sessions    Time  5    Period  Weeks    Status  Deferred      SLP LONG TERM GOAL #4   Title  pt will double check answers with speech therapy tasks (anticipatory awareness) with nonverbal cues over three sessions    Time  4    Status  Deferred      SLP LONG TERM GOAL #5   Title  SLP will monitor pt to foster cont'd pulmonary health    Time  4    Period  Weeks    Status  On-going      SLP LONG TERM GOAL #6   Title  pt will demo functional word finding skills in 8 minutes simple conversation for WNL conversational fluidity over 3 sessions    Time  4    Period  Weeks    Status  Deferred      SLP LONG TERM GOAL #7   Title  pt will demo compensations for memory in 80% of opportunities    Time  4    Period  Weeks    Status  Deferred       Plan - 12/06/18 1536    Clinical Impression Statement  Moderate dysarthria persists. Pt's cognitive deficits were evident today during tx- see "skilled intervention" for more details of today's session.  Focus on speech clarity continued today, as pt believes cognitive linguistic and word finding skills are at baseline. Pt would benefit from skilled ST to maximize intelligiblity, language, and cognitive commuication skills.     Speech Therapy Frequency  2x / week    Duration  --   10 weeks or 21 visits   Treatment/Interventions  Oral motor exercises;Cueing hierarchy;Environmental controls;Compensatory techniques;Cognitive reorganization;Functional tasks;SLP instruction and feedback;Patient/family education;Internal/external aids;Aspiration precaution training;Pharyngeal strengthening exercises;Diet toleration management by SLP;Trials of upgraded texture/liquids    Potential to Achieve Goals  Good    Potential Considerations   Ability to learn/carryover information;Severity of impairments    SLP Home Exercise Plan  Loud Hey and Ah; PHoRTE    Consulted and Agree with Plan of Care  Patient       Patient will benefit from skilled therapeutic intervention in order to improve the following deficits and impairments:   Dysarthria and anarthria  Aphasia  Dysphagia, oropharyngeal phase  Cognitive communication deficit    Problem List Patient Active Problem List   Diagnosis Date Noted  . Slow transit constipation   . Benign prostatic hyperplasia with urinary retention   . Right hemiparesis ([place])   . Chronic right shoulder pain   . Primary osteoarthritis of right shoulder   . Left basal ganglia embolic stroke ([place]) 79/89/2119  . CVA (cerebral vascular accident) ([place]) 08/31/2018  . Elevated aspartate aminotransferase level 06/16/2015  . H/O nutritional disorder 06/16/2015  . Excessive salivation 06/16/2015  . 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 Heart palpitations 06/16/2015  . 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 Screening for prostate cancer 08/21/2014  . Atypical chest pain 08/10/2013  . 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 [place], [place], [address] Phone: [phone]   Fax:  [phone]   Name: Edward Glass MRN: [id number] Date of Birth: May 06, 1956

## 2018-12-07 ENCOUNTER — Ambulatory Visit: Payer: BLUE CROSS/BLUE SHIELD | Admitting: Physical Therapy

## 2018-12-07 ENCOUNTER — Encounter: Payer: Self-pay | Admitting: Physical Medicine & Rehabilitation

## 2018-12-07 ENCOUNTER — Encounter: Payer: BLUE CROSS/BLUE SHIELD | Admitting: Occupational Therapy

## 2018-12-07 ENCOUNTER — Encounter: Payer: BC Managed Care – PPO | Attending: Registered Nurse | Admitting: Physical Medicine & Rehabilitation

## 2018-12-07 VITALS — BP 146/86 | HR 65 | Temp 99.2°F | Ht 65.0 in | Wt 160.0 lb

## 2018-12-07 DIAGNOSIS — G8191 Hemiplegia, unspecified affecting right dominant side: Secondary | ICD-10-CM | POA: Insufficient documentation

## 2018-12-07 DIAGNOSIS — I639 Cerebral infarction, unspecified: Secondary | ICD-10-CM | POA: Insufficient documentation

## 2018-12-07 DIAGNOSIS — E785 Hyperlipidemia, unspecified: Secondary | ICD-10-CM | POA: Diagnosis present

## 2018-12-07 DIAGNOSIS — M7501 Adhesive capsulitis of right shoulder: Secondary | ICD-10-CM

## 2018-12-07 NOTE — Progress Notes (Signed)
Shoulder injection Right    Indication:Right Shoulder pain not relieved by medication management and other conservative care.  Informed consent was obtained after describing risks and benefits of the procedure with the patient, this includes bleeding, bruising, infection and medication side effects. The patient wishes to proceed and has given written consent. Patient was placed in a seated position. The Right shoulder was marked and prepped with betadine in the subacromial area. A 25-gauge 1-1/2 inch needle was inserted into the subacromial area. After negative draw back for blood, a solution containing 1 mL of 6 mg per ML betamethasone and 4 mL of 1% lidocaine was injected. A band aid was applied. The patient tolerated the procedure well. Post procedure instructions were given.

## 2018-12-07 NOTE — Therapy (Signed)
[facility] [address] [place], [place], [address] Phone: [phone]   Fax:  [phone]  Physical Therapy Treatment  Patient Details  Name: Edward Glass MRN: [id number] Date of Birth: 07/23/1956 Referring Provider (PT): Kirsteins, Andrew MD   Encounter Date: 12/06/2018   CLINIC OPERATION CHANGES: [facility] is operating at a low capacity due to COVID-19.  The patient was brought into the clinic for evaluation and/or treatment following universal masking by staff, social distancing, and <10 people in the clinic.  The patient's COVID risk of complications score is 3.   PT End of Session - 12/07/18 1111    Visit Number  17    Number of Visits  27   updated; 2x/week x 3 more weeks, 1x/week x 4 weeks   Date for PT Re-Evaluation  02/03/19   updated on 12/06/2018 to reflect 8 week POC   Authorization Type  [employer]; met $7500 deductible; VL: PT/OT 30, seen on same day =1 visit; SLP 30 visits    Authorization Time Period  VL IS A HARD MAX    Authorization - Visit Number  18    Authorization - Number of Visits  30    PT Start Time  1104    PT Stop Time  1145    PT Time Calculation (min)  41 min    Activity Tolerance  Patient tolerated treatment well    Behavior During Therapy  WFL for tasks assessed/performed       Past Medical History:  Diagnosis Date  . Atypical chest pain 08/10/2013  . Elevated aspartate aminotransferase level 06/16/2015  . Excessive salivation 06/16/2015  . H/O nutritional disorder 06/16/2015  . 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 Screening for prostate cancer 08/21/2014  . 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 HERNIA REPAIR  2006  . left tricep surgery    . LOOP RECORDER INSERTION N/A 09/04/2018   Procedure: LOOP RECORDER INSERTION;  Surgeon: Taylor, Gregg W, MD;  Location: [facility];  Service: Cardiovascular;  Laterality: N/A;  . TEE WITHOUT CARDIOVERSION N/A 09/04/2018   Procedure: TRANSESOPHAGEAL ECHOCARDIOGRAM (TEE);  Surgeon: Skains, Mark C, MD;  Location: [facility];  Service: Cardiovascular;  Laterality: N/A;    There were no vitals filed for this visit.  Subjective Assessment - 12/06/18 1107    Subjective  Plan to continue with 2x/week x 3 more weeks and transition to 1x/week in June to prolong visits.  Using RW is going well at home.  No knee pain.  Having a little shoulder pain today.    Patient is accompained by:  --   friend Dena   Pertinent History  chronic rt shoulder pain; had recent fall with rt wrist pain, xray negative for fracture and had steroid injection 09/27/18    Limitations  Walking    Diagnostic tests  MRI-Multiple small foci of acute/early subacute infarctions present in the left MCA distribution concentrated in left basal ganglia; Small chronic infarcts in right parietal and occipital lobes    Patient Stated Goals  improve his walking and balance, be able to live on his own again in his 2 story house    Currently in Pain?  No/denies                       [facility] Adult PT Treatment/Exercise - 12/07/18 1117        Ambulation/Gait   Ambulation/Gait  Yes    Ambulation/Gait Assistance  5: Supervision    Ambulation/Gait Assistance Details  Decreased cues required for step through gait sequence and increased WB through [name]    Ambulation Distance (Feet)  115 Feet    Assistive device  Rolling walker    Gait Pattern  Step-through pattern;Decreased stance time - right;Decreased hip/knee flexion - right;Decreased weight shift to right    Ambulation Surface  Level;Indoor    Stairs  Yes    Stairs Assistance  4: Min guard    Stairs Assistance Details (indicate cue type and reason)  negotiating with step to sequence and L rail pt able to perform with intermittent assistance to fully clear and place R foot; cues required for knee flexion  vs. hip hiking to advance [name].  Performed this sequence x 2 to review for home.  Also performed stair negotiation with L rail with alternating sequence to facilitate increased weight shifting, [name] WB, activation of [name] for full clearance during advancement and eccentric control of [name].  Performed this sequence x 2 with mod A from therapist.  On final step pt did not allow R knee to flex and vaulted over [name] requiring total A of therapist to recover and prevent fall.  Therapist strongly recommending when pt returns home with significant other to perform stairs with step to sequencing    Stair Management Technique  One rail Left;Alternating pattern;Step to pattern;Forwards    Number of Stairs  16    Height of Stairs  6      Neuro Re-ed    Neuro Re-ed Details   NMR at counter for WB through RUE, rotation and weight shifting.  Placed RUE on counter top to the R and focused on head and trunk rotations away from counter (to L) while pressing through RUE to actively increase RUE external rotation and extension ROM.  Transitioned ot face counter: With LUE over RUE on towel performed reaching to L across midline with hips displacing to R side x 10 reps.  Also performed reaching to R side with R lateral step and R side squat pushing through [name] to return to standing in midline x 10 reps.  Performed active R lateral weight shifts while sliding RUE across counter top to encourage trunk elongation.  Performed trunk rotation reaching across midline to reach for cones and then performing R weight shift to place on counter top with cues from therapist to minimize use of shoulder elevation during reaching and weight shifting.             PT Education - 12/07/18 1144    Education Details  reviewed preferences for scheduling to conserve remaining visits; also discussed home set up and stairs pt will negotiate at his home.  Recommending pt have 2 RW, one for downstairs and one for upstairs    Person(s) Educated   Patient;Spouse    Methods  Explanation;Demonstration    Comprehension  Verbalized understanding;Returned demonstration       PT Short Term Goals - 12/07/18 1153      PT SHORT TERM GOAL #1   Title  =LTG        PT Long Term Goals - 12/07/18 1154      PT LONG TERM GOAL #1   Title  Patient will be independent with updated HEP and verbalize plan for continued community-based exercise plan.    Baseline  11/27/18: met with current HEP, will plan to progress/update in next session or 2.  Time  8    Period  Weeks    Status  On-going    Target Date  02/03/19      PT LONG TERM GOAL #2   Title  Patient will improve gait velocity to 1.30 ft/sec demonstrating progress towards decr fall risk    Baseline  11/27/18: 1.06 ft/sec with RW with supervision, cues for gait mechanics; decr'd from assessment last week on 11/24/18    Time  8    Period  Weeks    Status  On-going    Target Date  02/03/19      PT LONG TERM GOAL #3   Title  Pt will amb. short distances (approx. 20') without device for household amb. with LUE support on environmental objects as needed for assist with balance.     Time  8    Period  Weeks    Status  New    Target Date  02/03/19      PT LONG TERM GOAL #4   Title  Patient will ambulate over outdoor paved surfaces modified independent with LRAD, including up/down curb.     Baseline  11/27/18: min guard to min assist on paved surfaces, did not assess curb     Time  8    Period  Weeks    Status  On-going    Target Date  02/03/19      PT LONG TERM GOAL #6   Title  Patient will go up/down 12 steps with single rail modified independent     Baseline  min guard/CGA needed for safety - 11-29-18; Lt hand rail used    Time  8    Period  Weeks    Status  On-going    Target Date  02/03/19            Plan - 12/07/18 1146    Clinical Impression Statement  Continued discussion regarding visits; plan to continue with 2x/week for rest of month and then transition to 1x/week x 4  more weeks.  Discussed plan for pt to return to his home with significant other and set up of stairs.  Dena reports pt's son in law has installed multiple grab bars around the house and on the stairs for safety.  Plan to have two RW, one for upstairs, one for downstairs.  Performed stair negotiation training with step to sequence and alternating sequence; pt would not be safe to perform alternating sequence currently.  Continued NMR at counter top to facilitate WB and use of RUE, weight shifting and trunk rotation.  No pain reported in R knee during activities.  Will continue to address and progress towards LTG.    Personal Factors and Comorbidities  Comorbidity 1    Comorbidities  chronic rt shoulder pain    Examination-Activity Limitations  Bathing;Bed Mobility;Caring for Others;Carry;Dressing;Locomotion Level;Lift;Reach Overhead;Self Feeding;Stairs    Examination-Participation Restrictions  Community Activity;Driving    Stability/Clinical Decision Making  Stable/Uncomplicated    Rehab Potential  Good    PT Frequency  2x / week    PT Duration  8 weeks    PT Treatment/Interventions  ADLs/Self Care Home Management;Aquatic Therapy;Electrical Stimulation;DME Instruction;Gait training;Stair training;Functional mobility training;Therapeutic activities;Therapeutic exercise;Balance training;Orthotic Fit/Training;Patient/family education;Neuromuscular re-education;Wheelchair mobility training;Passive range of motion;Visual/perceptual remediation/compensation    PT Next Visit Plan  Cont gait and balance training - have done gait training without device;  Bioness for [name] (hamstring/ant tib)., stair negotiation training, tall kneeling/half kneeling, standing weight shifting, R motor control.  Exercises with Bioness/standing rotation/reaching   at counter top-increased use of RUE    Consulted and Agree with Plan of Care  Patient    Family Member Consulted  friend - Dena       Patient will benefit from skilled  therapeutic intervention in order to improve the following deficits and impairments:  Abnormal gait, Decreased activity tolerance, Decreased balance, Decreased endurance, Decreased coordination, Decreased cognition, Decreased knowledge of use of DME, Decreased mobility, Decreased safety awareness, Difficulty walking, Decreased strength, Increased edema, Impaired perceived functional ability, Impaired UE functional use  Visit Diagnosis: Hemiplegia and hemiparesis following cerebral infarction affecting right dominant side (HCC)  Other abnormalities of gait and mobility  Unsteadiness on feet  Other symptoms and signs involving the nervous system     Problem List Patient Active Problem List   Diagnosis Date Noted  . Slow transit constipation   . Benign prostatic hyperplasia with urinary retention   . Right hemiparesis ([place])   . Chronic right shoulder pain   . Primary osteoarthritis of right shoulder   . Left basal ganglia embolic stroke ([place]) 32/20/2542  . CVA (cerebral vascular accident) ([place]) 08/31/2018  . Elevated aspartate aminotransferase level 06/16/2015  . H/O nutritional disorder 06/16/2015  . Excessive salivation 06/16/2015  . 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 Heart palpitations 06/16/2015  . 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 Screening for prostate cancer 08/21/2014  . Atypical chest pain 08/10/2013  . 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

## 2018-12-07 NOTE — Patient Instructions (Addendum)
Shoulder injection Right side   [place] Lidocaine May be repeated in 3 months if needed   STOP Aspirin Continue CLOPIDIGREL

## 2018-12-08 ENCOUNTER — Encounter: Payer: BLUE CROSS/BLUE SHIELD | Admitting: Occupational Therapy

## 2018-12-08 ENCOUNTER — Ambulatory Visit: Payer: BLUE CROSS/BLUE SHIELD | Admitting: Physical Therapy

## 2018-12-11 ENCOUNTER — Ambulatory Visit: Payer: BC Managed Care – PPO | Admitting: Occupational Therapy

## 2018-12-11 ENCOUNTER — Encounter: Payer: BLUE CROSS/BLUE SHIELD | Admitting: Speech Pathology

## 2018-12-11 ENCOUNTER — Encounter: Payer: Self-pay | Admitting: Occupational Therapy

## 2018-12-11 ENCOUNTER — Ambulatory Visit: Payer: BC Managed Care – PPO | Admitting: Physical Therapy

## 2018-12-11 ENCOUNTER — Other Ambulatory Visit: Payer: Self-pay

## 2018-12-11 DIAGNOSIS — R2689 Other abnormalities of gait and mobility: Secondary | ICD-10-CM

## 2018-12-11 DIAGNOSIS — I69351 Hemiplegia and hemiparesis following cerebral infarction affecting right dominant side: Secondary | ICD-10-CM

## 2018-12-11 DIAGNOSIS — M25511 Pain in right shoulder: Secondary | ICD-10-CM

## 2018-12-11 DIAGNOSIS — M6281 Muscle weakness (generalized): Secondary | ICD-10-CM

## 2018-12-11 DIAGNOSIS — R29818 Other symptoms and signs involving the nervous system: Secondary | ICD-10-CM

## 2018-12-11 DIAGNOSIS — R293 Abnormal posture: Secondary | ICD-10-CM

## 2018-12-11 DIAGNOSIS — R2681 Unsteadiness on feet: Secondary | ICD-10-CM

## 2018-12-11 DIAGNOSIS — I69318 Other symptoms and signs involving cognitive functions following cerebral infarction: Secondary | ICD-10-CM

## 2018-12-11 DIAGNOSIS — R41841 Cognitive communication deficit: Secondary | ICD-10-CM | POA: Diagnosis not present

## 2018-12-11 NOTE — Therapy (Signed)
[facility] [address] [place], [place], [address] Phone: [phone]   Fax:  [phone]  Occupational Therapy Treatment  Patient Details  Name: Edward Glass MRN: [id number] Date of Birth: 1955-10-18 Referring Provider (OT): Dr. Alysia Penna   Encounter Date: 12/11/2018  OT End of Session - 12/11/18 1302    Visit Number  18    Number of Visits  24    Date for OT Re-Evaluation  12/28/18    Authorization Type  [employer] - has 30 visits between OT and PT however if visits occur on same day counts as one visit. Pt wishes to use 23 visits and then save remaining visits for later in the year.    Authorization - Visit Number  19    Authorization - Number of Visits  24    OT Start Time  1017    OT Stop Time  1101    OT Time Calculation (min)  44 min    Activity Tolerance  Patient tolerated treatment well       Past Medical History:  Diagnosis Date  . Atypical chest pain 08/10/2013  . Elevated aspartate aminotransferase level 06/16/2015  . Excessive salivation 06/16/2015  . H/O nutritional disorder 06/16/2015  . 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 Screening for prostate cancer 08/21/2014  . 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 HERNIA REPAIR  2006  . left tricep surgery    . LOOP RECORDER INSERTION N/A 09/04/2018   Procedure: LOOP RECORDER INSERTION;  Surgeon: Evans Lance, MD;  Location: [facility];  Service: Cardiovascular;  Laterality: N/A;  . TEE WITHOUT CARDIOVERSION N/A 09/04/2018   Procedure: TRANSESOPHAGEAL ECHOCARDIOGRAM (TEE);  Surgeon: Jerline Pain, MD;  Location: [facility];  Service: Cardiovascular;  Laterality: N/A;    There were no vitals filed for this visit.  Subjective Assessment - 12/11/18 1022    Subjective   I guess my pain is a little better from that shot last week    Patient is accompanied by:   Family member   daughter   Pertinent History  L CVA on 09/14/18  Loop recorder, pt taking blood thinner    Currently in Pain?  Yes   with abduction due to hiking shoulder   Pain Score  4     Pain Location  Shoulder    Pain Orientation  Right    Pain Descriptors / Indicators  Aching;Sore    Pain Type  Chronic pain    Pain Onset  More than a month ago    Aggravating Factors   moving into abduction. Pt hikes shoulder and "locks" arm against his side causing abnormal movement pattern.  WIth active assistance no pain    Pain Relieving Factors  avoid that movement                   OT Treatments/Exercises (OP) - 12/11/18 0001      ADLs   Eating  Demonstrated rocker knife and pt able to return demonstrate. Also issued red foam for utensils as pt is using RUE to self feed at home. Pt able to demonstrate use of fork with very little compensation during hand to mouth reach pattern.       Neurological Re-education Exercises   Other Exercises 1  Neuro re ed to address active assisted shoulder abduction, shoulder flexion and isolated elbow flexion/extension with shoulder at 90*  flexion all in supine in prep for low reach activity.  Due to apraxia, pt needs signficant repetition and also needs to experience passive movement first followed by active assisted.  Transitioned into sitting and pt able to use RUE to push up into sitting with min facilitation and cues.  Addresesd forward bilateral reach with BUE's over [name] to R foot - pt using this activity at home as part of HEP however in clinic today pt demonstrating very poor alignment with task. Educated dtr in assisting pt and daughter and pt able to return demonstrate. Pt and daughter also educated in table slides and both able to return demonstrate (pt needs min facilitation from daughter for more normal movement pattern).  Also addressed postural alignment and control for sit to stand,and functional ambulation as this greatly impacts pt's  posturing of RUE.               OT Education - 12/11/18 1258    Education Details  updated HEP to include table slides with assistance from daughter. Also reviewed current HEP and provided feedback.     Person(s) Educated  Patient;Child(ren)    Methods  Explanation;Demonstration;Verbal cues;Tactile cues    Comprehension  Verbalized understanding;Returned demonstration   daughter to assist       OT Short Term Goals - 12/11/18 1259      OT SHORT TERM GOAL #1   Title  Pt and family will be mod I  with RUE positioning/care to address edema and pain in RUE - 11/23/2018 (goal adjusted as pt missed one week)    Status  Achieved      OT SHORT TERM GOAL #2   Title  Pt and family will be mod I with splint wear and care    Status  Achieved      OT SHORT TERM GOAL #3   Title  Pt and dtr will be mod I with HEP for RUE for ROM, addressing pain, functional use of RUE prn    Status  Achieved      OT SHORT TERM GOAL #4   Title  Pt will report pain in R hand/wrist no greater than 5/10 during HEP.      Status  Achieved      OT SHORT TERM GOAL #5   Title  Pt will demonstrate the ability for 55* of shoulder flexion as prep for functional low reach    Status  On-going   45-50     OT SHORT TERM GOAL #6   Title  Pt will be mod I with toilet and tub bench transfers    Status  Achieved   met per pt report     OT SHORT TERM GOAL #7   Title  Pt will be mod I with bathing at shower level    Status  Achieved   met per pt report       OT Long Term Goals - 12/11/18 1300      OT LONG TERM GOAL #1   Title  Pt and family will be mod I with upgraded home activities program - 01/05/2019 (goal adjusted as pt missed one week)    Status  On-going      OT LONG TERM GOAL #2   Title  Pt will demonstrate ability to grasp and hold light weight object in R hand with pain 2/10 or less    Status  Achieved      OT LONG TERM GOAL #3   Title  Pt  will demonstrate ability for bilateral low reach to obtain  light weigh object     Status  On-going      OT LONG TERM GOAL #4   Title  Pt will be mod I with dressing    Status  Achieved      OT LONG TERM GOAL #5   Title  Pt will be mod I with cutting food, AE prn    Status  Achieved      OT LONG TERM GOAL #6   Title  Pt will demonstrate ability to use RUE as stabilizer at least 75% of the time in simple ADL tasks    Status  On-going      OT LONG TERM GOAL #7   Title  Pt will be mod I with simple snack/be prep at ambulatory level    Status  On-going      OT LONG TERM GOAL #8   Title  Pt will demonstrate improved postural alignment to decrease pain in RUE as well as decrease risk of falls    Status  On-going            Plan - 12/11/18 1300    Clinical Impression Statement  Pt with slow progress toward goals. Pt is motivated and he and daugther report that pt is mod I in all bathing and dressing activities and using RUE to self feed.  Updated LTG's and HEP    Occupational performance deficits (Please refer to evaluation for details):  ADL's;IADL's;Rest and Sleep;Work;Leisure;Social Participation    Body Structure / Function / Physical Skills  ADL;Balance;Decreased knowledge of use of DME;Coordination;Edema;GMC;FMC;Flexibility;IADL;Mobility;Pain;ROM;Strength;Tone;UE functional use;Endurance    Cognitive Skills  Safety Awareness    Rehab Potential  Good    Comorbidities Affecting Occupational Performance:  Presence of comorbidities impacting occupational performance    Comorbidities impacting occupational performance description:  HTN, atypical chest pain, chronic R shoulder pain, OA of R shoulder, aphasia, apraxia, poor sensation    OT Frequency  2x / week    OT Duration  12 weeks    OT Treatment/Interventions  Self-care/ADL training;Aquatic Therapy;Cryotherapy;Therapeutic exercise;Neuromuscular education;Energy conservation;DME and/or AE instruction;Functional Mobility Training;Cognitive remediation/compensation;Balance  training;Splinting;Manual Therapy;Passive range of motion;Therapeutic activities;Patient/family education    Plan   continue NMR RUE/trunk for low reach patterns, nmr for functional mobility, continue to address LTG's    Consulted and Agree with Plan of Care  Patient;Family member/caregiver    Family Member Consulted  daughter       Patient will benefit from skilled therapeutic intervention in order to improve the following deficits and impairments:  Body Structure / Function / Physical Skills, Cognitive Skills  Visit Diagnosis: Hemiplegia and hemiparesis following cerebral infarction affecting right dominant side (HCC)  Unsteadiness on feet  Other symptoms and signs involving the nervous system  Muscle weakness (generalized)  Acute pain of right shoulder  Abnormal posture  Other symptoms and signs involving cognitive functions following cerebral infarction    Problem List Patient Active Problem List   Diagnosis Date Noted  . Slow transit constipation   . Benign prostatic hyperplasia with urinary retention   . Right hemiparesis ([place])   . Chronic right shoulder pain   . Primary osteoarthritis of right shoulder   . Left basal ganglia embolic stroke ([place]) 65/68/1275  . CVA (cerebral vascular accident) ([facility]) 08/31/2018  . Elevated aspartate aminotransferase level 06/16/2015  . H/O nutritional disorder 06/16/2015  . Excessive salivation 06/16/2015  . 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 Heart palpitations 06/16/2015  . 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 Screening for prostate cancer 08/21/2014  . Atypical chest pain 08/10/2013  . 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 [place], [place], [address] Phone: [phone]   Fax:  [phone]  Name: Edward Glass MRN: [id number] Date of Birth: 02-24-1956

## 2018-12-12 ENCOUNTER — Other Ambulatory Visit: Payer: Self-pay

## 2018-12-12 ENCOUNTER — Ambulatory Visit: Payer: BLUE CROSS/BLUE SHIELD | Admitting: Physical Therapy

## 2018-12-12 ENCOUNTER — Ambulatory Visit: Payer: BC Managed Care – PPO | Admitting: Speech Pathology

## 2018-12-12 ENCOUNTER — Ambulatory Visit (INDEPENDENT_AMBULATORY_CARE_PROVIDER_SITE_OTHER): Payer: BLUE CROSS/BLUE SHIELD | Admitting: *Deleted

## 2018-12-12 DIAGNOSIS — R471 Dysarthria and anarthria: Secondary | ICD-10-CM

## 2018-12-12 DIAGNOSIS — I63512 Cerebral infarction due to unspecified occlusion or stenosis of left middle cerebral artery: Secondary | ICD-10-CM | POA: Diagnosis not present

## 2018-12-12 DIAGNOSIS — R41841 Cognitive communication deficit: Secondary | ICD-10-CM | POA: Diagnosis not present

## 2018-12-12 NOTE — Therapy (Signed)
[facility] [address] [place], [place], [address] Phone: [phone]   Fax:  [phone]  Physical Therapy Treatment  Patient Details  Name: Edward Glass MRN: [id number] Date of Birth: 1956/03/17 Referring Provider (PT): Alysia Penna MD   CLINIC OPERATION CHANGES: [facility] is operating at a low capacity due to COVID-19.  The patient was brought into the clinic for evaluation and/or treatment  following universal masking by staff, social distancing, and <10 people in the clinic.  The patient's COVID risk of complications score is 3.   Encounter Date: 12/11/2018  PT End of Session - 12/12/18 0838    Visit Number  18    Number of Visits  27   21 visits to be completed (based on pt's request to save 8 visits for remainder of calendar yr)   Date for PT Re-Evaluation  02/03/19    Authorization Type  BCBS; met $7500 deductible; VL: PT/OT 30, seen on same day =1 visit; SLP 30 visits    Authorization Time Period  VL IS A HARD MAX    Authorization - Visit Number  19    Authorization - Number of Visits  30    PT Start Time  1102    PT Stop Time  1158   treatment session extended due to 11:45 cancellation   PT Time Calculation (min)  56 min    Equipment Utilized During Treatment  Gait belt    Activity Tolerance  Patient tolerated treatment well    Behavior During Therapy  WFL for tasks assessed/performed       Past Medical History:  Diagnosis Date  . Atypical chest pain 08/10/2013  . Elevated aspartate aminotransferase level 06/16/2015  . Excessive salivation 06/16/2015  . H/O nutritional disorder 06/16/2015  . 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 Screening for prostate cancer 08/21/2014  . 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 HERNIA REPAIR  2006  . left tricep surgery    . LOOP RECORDER INSERTION N/A  09/04/2018   Procedure: LOOP RECORDER INSERTION;  Surgeon: Evans Lance, MD;  Location: [facility];  Service: Cardiovascular;  Laterality: N/A;  . TEE WITHOUT CARDIOVERSION N/A 09/04/2018   Procedure: TRANSESOPHAGEAL ECHOCARDIOGRAM (TEE);  Surgeon: Jerline Pain, MD;  Location: [facility] ENDOSCOPY;  Service: Cardiovascular;  Laterality: N/A;    There were no vitals filed for this visit.  Subjective Assessment - 12/12/18 0812    Subjective  Pt states he moved back into his home - states he is not having any problem going up/down steps there but someone always stands by for safety    Patient is accompained by:  Family member   [name]   Pertinent History  chronic rt shoulder pain; had recent fall with rt wrist pain, xray negative for fracture and had steroid injection 09/27/18    Diagnostic tests  MRI-Multiple small foci of acute/early subacute infarctions present in the left MCA distribution concentrated in left basal ganglia; Small chronic infarcts in right parietal and occipital lobes    Patient Stated Goals  improve his walking and balance, be able to live on his own again in his 2 story house    Currently in Pain?  Yes    Pain Score  4     Pain Location  Shoulder    Pain Orientation  Right    Pain Descriptors / Indicators  Sore  Pain Type  Chronic pain    Pain Onset  More than a month ago    Pain Frequency  Intermittent                       [facility] Adult PT Treatment/Exercise - 12/12/18 0001      Transfers   Transfers  Sit to Stand    Sit to Stand  4: Min assist    Stand to Sit  5: Supervision    Number of Reps  10 reps    Comments  Lt foot placed on balance bubble for increased weight bearing & for strengthening [name] in closed chain; no UE support used       Ambulation/Gait   Ambulation/Gait  Yes    Ambulation/Gait Assistance  4: Min guard    Ambulation/Gait Assistance Details  Metranome used to facilitate increased gait velocity with less hesitation  with [name] weight bearing prior to LLE swing thru; pt's UE's placed on therapist's UE's with attempts to keep pt's RUE lifted to decr. Rt shoulder elevation    Ambulation Distance (Feet)  230 Feet   115' on 2nd rep; 59' inside // bars with mirror for visual f   Assistive device  None    Gait Pattern  Step-through pattern;Decreased stance time - right;Decreased hip/knee flexion - right;Decreased weight shift to right    Ambulation Surface  Level;Indoor      Neuro Re-ed    Neuro Re-ed Details   Rockerboard placed inside // bars - pt stood on board - tapped floor in front with LLE to facilitate Rt anterior weight shift and Rt ankle AROM - min assist with this activity;  pt performed stepping iwth LLE inside // bars with use of mirror for visual feedback - stepping with LLE forward, back and sideways 10 reps each direction without UE support;  pt formed [name] SLS/weight bearing activity with taps to balance bubble with LLE in standing without UE support for improved [name] weight bearing and SLS      Knee/Hip Exercises: Standing   Forward Step Up  Right;1 set;10 reps;Hand Hold: 1;Step Height: 6"    Step Down  Right;1 set;10 reps;Hand Hold: 1;Step Height: 4"   performed inside // bars     Pt performed Rt hamstring strengthening exercise - seated in rolling office chair - pulled forward with [name] with mod assist 30' x 2 reps    Balance Exercises - 12/12/18 0831      Balance Exercises: Standing   Rockerboard  Anterior/posterior;10 reps   2 sets without UE support with min assist for Rt ankle AROM   Other Standing Exercises  pt performed stepping over and back of black balance beam inside // bars with minimal LUE support - stepping over and back with LLE for [name] strengthening and SLS      Tap ups with LLE to 1st step with minimal LUE support on rail with CGA - 10 reps with tactile cue to prevent Rt knee hyperextension   PT Education - 12/12/18 0834    Education Details  Discussed recommendation for  aquatic therapy with need to conserve few visits for this service, if pt so desires; pt states he does want to participate in aquatic therapy - will discuss with OT to determine if pt is seen for PT or OT in pool; pt reports he will keep frequency 2x/week for this week and then will reduce to 1x/week for next 2 weeks to have 2 visits  for aquatic therapy     Person(s) Educated  Patient;Child(ren)    Methods  Explanation    Comprehension  Verbalized understanding       PT Short Term Goals - 12/07/18 1153      PT SHORT TERM GOAL #1   Title  =LTG        PT Long Term Goals - 12/07/18 1154      PT LONG TERM GOAL #1   Title  Patient will be independent with updated HEP and verbalize plan for continued community-based exercise plan.    Baseline  11/27/18: met with current HEP, will plan to progress/update in next session or 2.    Time  8    Period  Weeks    Status  On-going    Target Date  02/03/19      PT LONG TERM GOAL #2   Title  Patient will improve gait velocity to 1.30 ft/sec demonstrating progress towards decr fall risk    Baseline  11/27/18: 1.06 ft/sec with RW with supervision, cues for gait mechanics; decr'd from assessment last week on 11/24/18    Time  8    Period  Weeks    Status  On-going    Target Date  02/03/19      PT LONG TERM GOAL #3   Title  Pt will amb. short distances (approx. 73') without device for household amb. with LUE support on environmental objects as needed for assist with balance.     Time  8    Period  Weeks    Status  New    Target Date  02/03/19      PT LONG TERM GOAL #4   Title  Patient will ambulate over outdoor paved surfaces modified independent with LRAD, including up/down curb.     Baseline  11/27/18: min guard to min assist on paved surfaces, did not assess curb     Time  8    Period  Weeks    Status  On-going    Target Date  02/03/19      PT LONG TERM GOAL #6   Title  Patient will go up/down 12 steps with single rail modified independent      Baseline  min guard/CGA needed for safety - 11-29-18; Lt hand rail used    Time  8    Period  Weeks    Status  On-going    Target Date  02/03/19            Plan - 12/12/18 0841    Clinical Impression Statement  Skilled session focused on gait training without device and also on [name] strengthening in closed chain; use of metronome used initially to faciliate more fluid, smooth gait pattern with less hesitation for [name] weight shift and weight bearing (pt appears to be fearful of Rt knee hyperextension); pt had mod. difficulty with stool pulls with [name] due to weakness in Rt hamstrings and spasticity.  Pt's performance with activities improves with practice and repetition; pt able to lift [name] onto 6" step more easily with final 4-5 reps.  Pt tolerated session well with few rest breaks required.      Personal Factors and Comorbidities  Comorbidity 1    Comorbidities  chronic rt shoulder pain    Examination-Activity Limitations  Bathing;Bed Mobility;Caring for Others;Carry;Dressing;Locomotion Level;Lift;Reach Overhead;Self Feeding;Stairs    Examination-Participation Restrictions  Community Activity;Driving    Stability/Clinical Decision Making  Stable/Uncomplicated    Rehab Potential  Good  PT Frequency  2x / week   1 week; pt requests to reduce frequency to 1x/week for next 2 weeks   PT Duration  3 weeks    PT Treatment/Interventions  ADLs/Self Care Home Management;Aquatic Therapy;Electrical Stimulation;DME Instruction;Gait training;Stair training;Functional mobility training;Therapeutic activities;Therapeutic exercise;Balance training;Orthotic Fit/Training;Patient/family education;Neuromuscular re-education;Wheelchair mobility training;Passive range of motion;Visual/perceptual remediation/compensation    PT Next Visit Plan  Cont [name] strengthening, [name] weight shift and weight bearing activities with Rt knee control;  [place] prn    Recommended Other Services  pt wishes to participate in  aquatic therapy - will determine if pt seen for OT or PT in pool;     Consulted and Agree with Plan of Care  Patient    Family Member Consulted  [name]       Patient will benefit from skilled therapeutic intervention in order to improve the following deficits and impairments:  Abnormal gait, Decreased activity tolerance, Decreased balance, Decreased endurance, Decreased coordination, Decreased cognition, Decreased knowledge of use of DME, Decreased mobility, Decreased safety awareness, Difficulty walking, Decreased strength, Increased edema, Impaired perceived functional ability, Impaired UE functional use  Visit Diagnosis: Other abnormalities of gait and mobility  Hemiplegia and hemiparesis following cerebral infarction affecting right dominant side (HCC)  Unsteadiness on feet  Muscle weakness (generalized)     Problem List Patient Active Problem List   Diagnosis Date Noted  . Slow transit constipation   . Benign prostatic hyperplasia with urinary retention   . Right hemiparesis ([place])   . Chronic right shoulder pain   . Primary osteoarthritis of right shoulder   . Left basal ganglia embolic stroke ([place]) 83/16/7425  . CVA (cerebral vascular accident) ([place]) 08/31/2018  . Elevated aspartate aminotransferase level 06/16/2015  . H/O nutritional disorder 06/16/2015  . Excessive salivation 06/16/2015  . 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 Heart palpitations 06/16/2015  . 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 Screening for prostate cancer 08/21/2014  . Atypical chest pain 08/10/2013  . 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

## 2018-12-12 NOTE — Therapy (Signed)
[facility] [address] [place], [place], [address] Phone: [phone]   Fax:  [phone]  Speech Language Pathology Treatment  Patient Details  Name: Edward Glass MRN: [id number] Date of Birth: January 25, 1956 Referring Provider (SLP): Alysia Penna, MD   Encounter Date: 12/12/2018  End of Session - 12/12/18 1244    Visit Number  15    Number of Visits  21    Date for SLP Re-Evaluation  12/27/18    Authorization Type  30 visits ST (20 planned, save 10?)    Authorization - Visit Number  15    Authorization - Number of Visits  30    SLP Start Time  3474    SLP Stop Time   1230    SLP Time Calculation (min)  42 min    Activity Tolerance  Patient tolerated treatment well       Past Medical History:  Diagnosis Date  . Atypical chest pain 08/10/2013  . Elevated aspartate aminotransferase level 06/16/2015  . Excessive salivation 06/16/2015  . H/O nutritional disorder 06/16/2015  . 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 Screening for prostate cancer 08/21/2014  . 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 HERNIA REPAIR  2006  . left tricep surgery    . LOOP RECORDER INSERTION N/A 09/04/2018   Procedure: LOOP RECORDER INSERTION;  Surgeon: Evans Lance, MD;  Location: [facility];  Service: Cardiovascular;  Laterality: N/A;  . TEE WITHOUT CARDIOVERSION N/A 09/04/2018   Procedure: TRANSESOPHAGEAL ECHOCARDIOGRAM (TEE);  Surgeon: Jerline Pain, MD;  Location: [facility] ENDOSCOPY;  Service: Cardiovascular;  Laterality: N/A;    There were no vitals filed for this visit.         ADULT SLP TREATMENT - 12/12/18 1211      General Information   Behavior/Cognition  Alert;Cooperative      Treatment Provided   Treatment provided  Cognitive-Linquistic      Cognitive-Linquistic Treatment   Treatment focused on  Dysarthria;Voice    Skilled  Treatment  Recalibrated volume with Hey Ahh!  and simple sentneces (reading) with average of 70+dB. Pt utilized adequate volume in structured tasks. 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       Assessment / Recommendations / Plan   Plan  Continue with current plan of care      Progression Toward Goals   Progression toward goals  Progressing toward goals       SLP Education - 12/12/18 1239    Education Details  continue PHORTE     Person(s) Educated  Patient;Caregiver(s)   Edward Glass   Methods  Explanation    Comprehension  Verbalized understanding;Need further instruction       SLP Short Term Goals - 12/12/18 1241      SLP SHORT TERM GOAL #1   Title  Pt will undergo naming assessment and/or cognitive linguistic assessment PRN    Baseline  BNT: 10-24-18, [facility] 11-08-18    Status  Achieved      SLP SHORT TERM GOAL #2   Title  pt will demo improved conversational volume in sentence responses- average mid-upper 60s dB, over two sessions    Status  Not Met      SLP SHORT TERM GOAL #3   Title  pt will  demo improved conversational volume in 5 mintues simple conversation with average mid-upper 60s dB, over three sessions    Status  Not Met      SLP SHORT TERM GOAL #4   Title  pt will demo abdominal breathing 60% of the time at rest over two sessions    Baseline  11-29-18, 12-01-18    Status  Achieved   from 'in 5 minutes conversation over three sessions"     SLP SHORT TERM GOAL #5   Title  pt will name 3 non-physical deficits over three sessions    Status  Not Met      SLP SHORT TERM GOAL #6   Title  pt/family will provide 3 overt s/s aspiration PNA with modified independence over 2 sessions    Time  1    Period  Weeks    Status  Deferred      SLP SHORT TERM GOAL #7   Title  pt will complete simple to mod complex naming tasks pertinent to  pt's interests and life experience/s 85% over three sessions    Status  Not Met       SLP Long Term Goals - 12/12/18 [place] #1   Title  pt will demo improved conversational volume in 8 minutes simple conversation average upper 60s-low 70s dB, over three sessions    Time  3    Period  Weeks   or 21 total visits, for all LTGs   Status  On-going      SLP LONG TERM GOAL #2   Title  pt will demo abdominal breathing 70% of the time in 5 minutes conversation over three sessions    Time  3    Period  Weeks    Status  On-going      SLP LONG TERM GOAL #3   Title  pt will complete a functional, simple organizational task for 25 minutes demonstrating adequate selelctive attention with modified indpendence, over three sessions    Period  Weeks    Status  Deferred      SLP LONG TERM GOAL #4   Title  pt will double check answers with speech therapy tasks (anticipatory awareness) with nonverbal cues over three sessions    Time  4    Status  Deferred      SLP LONG TERM GOAL #5   Title  SLP will monitor pt to foster cont'd pulmonary health    Time  4    Period  Weeks    Status  On-going      SLP LONG TERM GOAL #6   Title  pt will demo functional word finding skills in 8 minutes simple conversation for WNL conversational fluidity over 3 sessions    Time  4    Period  Weeks    Status  Deferred      SLP LONG TERM GOAL #7   Title  pt will demo compensations for memory in 80% of opportunities    Time  4    Period  Weeks    Status  Deferred       Plan - 12/12/18 1240    Clinical Impression Statement  Moderate dysarthria persists. Pt's cognitive deficits were evident today during tx- see "skilled intervention" for more details of today's session.  Focus on speech clarity continued today, as pt believes cognitive linguistic and word finding skills are at baseline. Pt would benefit from skilled ST to maximize  intelligiblity, language, and cognitive commuication skills.      Speech Therapy Frequency  2x / week    Duration  --   10 weeks or 21 visits   Treatment/Interventions  Oral motor exercises;Cueing hierarchy;Environmental controls;Compensatory techniques;Cognitive reorganization;Functional tasks;SLP instruction and feedback;Patient/family education;Internal/external aids;Aspiration precaution training;Pharyngeal strengthening exercises;Diet toleration management by SLP;Trials of upgraded texture/liquids    Potential to Achieve Goals  Good    Potential Considerations  Ability to learn/carryover information;Severity of impairments       Patient will benefit from skilled therapeutic intervention in order to improve the following deficits and impairments:   Dysarthria and anarthria  Cognitive communication deficit    Problem List Patient Active Problem List   Diagnosis Date Noted  . Slow transit constipation   . Benign prostatic hyperplasia with urinary retention   . Right hemiparesis ([place])   . Chronic right shoulder pain   . Primary osteoarthritis of right shoulder   . Left basal ganglia embolic stroke ([place]) 43/88/8757  . CVA (cerebral vascular accident) ([place]) 08/31/2018  . Elevated aspartate aminotransferase level 06/16/2015  . H/O nutritional disorder 06/16/2015  . Excessive salivation 06/16/2015  . 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 Heart palpitations 06/16/2015  . 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 Screening for prostate cancer 08/21/2014  . Atypical chest pain 08/10/2013  . 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

## 2018-12-13 ENCOUNTER — Encounter: Payer: Self-pay | Admitting: Physical Therapy

## 2018-12-13 ENCOUNTER — Ambulatory Visit: Payer: BC Managed Care – PPO | Admitting: Physical Therapy

## 2018-12-13 ENCOUNTER — Ambulatory Visit: Payer: BC Managed Care – PPO

## 2018-12-13 ENCOUNTER — Other Ambulatory Visit: Payer: Self-pay

## 2018-12-13 ENCOUNTER — Ambulatory Visit: Payer: BC Managed Care – PPO | Admitting: Occupational Therapy

## 2018-12-13 DIAGNOSIS — R2681 Unsteadiness on feet: Secondary | ICD-10-CM

## 2018-12-13 DIAGNOSIS — R2689 Other abnormalities of gait and mobility: Secondary | ICD-10-CM

## 2018-12-13 DIAGNOSIS — R29818 Other symptoms and signs involving the nervous system: Secondary | ICD-10-CM

## 2018-12-13 DIAGNOSIS — R471 Dysarthria and anarthria: Secondary | ICD-10-CM

## 2018-12-13 DIAGNOSIS — I69351 Hemiplegia and hemiparesis following cerebral infarction affecting right dominant side: Secondary | ICD-10-CM

## 2018-12-13 DIAGNOSIS — R41841 Cognitive communication deficit: Secondary | ICD-10-CM | POA: Diagnosis not present

## 2018-12-13 DIAGNOSIS — M6281 Muscle weakness (generalized): Secondary | ICD-10-CM

## 2018-12-13 DIAGNOSIS — M25511 Pain in right shoulder: Secondary | ICD-10-CM

## 2018-12-13 DIAGNOSIS — R1312 Dysphagia, oropharyngeal phase: Secondary | ICD-10-CM

## 2018-12-13 DIAGNOSIS — R4701 Aphasia: Secondary | ICD-10-CM

## 2018-12-13 LAB — CUP PACEART REMOTE DEVICE CHECK
Date Time Interrogation Session: 20200519234138
Implantable Pulse Generator Implant Date: 20200210

## 2018-12-13 NOTE — Therapy (Signed)
[facility] [address] [place], [place], [address] Phone: [phone]   Fax:  [phone]  Physical Therapy Treatment  Patient Details  Name: Edward Glass MRN: [id number] Date of Birth: 04/23/1956 Referring Provider (PT): Alysia Penna MD   Encounter Date: 12/13/2018   CLINIC OPERATION CHANGES: [facility] is operating at a low capacity due to COVID-19.  The patient was brought into the clinic for evaluation and/or treatment following universal masking by staff, social distancing, and <10 people in the clinic.  The patient's COVID risk of complications score is 3.   PT End of Session - 12/13/18 1304    Visit Number  19    Number of Visits  27   21 visits to be completed (based on pt's request to save 8 visits for remainder of calendar yr)   Date for PT Re-Evaluation  02/03/19    Authorization Type  BCBS; met $7500 deductible; VL: PT/OT 30, seen on same day =1 visit; SLP 30 visits    Authorization Time Period  VL IS A HARD MAX    Authorization - Visit Number  20    Authorization - Number of Visits  30    PT Start Time  1104    PT Stop Time  1145    PT Time Calculation (min)  41 min    Activity Tolerance  Patient tolerated treatment well    Behavior During Therapy  WFL for tasks assessed/performed       Past Medical History:  Diagnosis Date  . Atypical chest pain 08/10/2013  . Elevated aspartate aminotransferase level 06/16/2015  . Excessive salivation 06/16/2015  . H/O nutritional disorder 06/16/2015  . 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 Screening for prostate cancer 08/21/2014  . 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 HERNIA REPAIR  2006  . left tricep surgery    . LOOP RECORDER INSERTION N/A 09/04/2018   Procedure: LOOP RECORDER INSERTION;  Surgeon: Evans Lance, MD;  Location: [facility];  Service: Cardiovascular;  Laterality: N/A;  . TEE WITHOUT CARDIOVERSION N/A 09/04/2018   Procedure: TRANSESOPHAGEAL ECHOCARDIOGRAM (TEE);  Surgeon: Jerline Pain, MD;  Location: [facility] ENDOSCOPY;  Service: Cardiovascular;  Laterality: N/A;    There were no vitals filed for this visit.  Subjective Assessment - 12/13/18 1114    Subjective  Continued to discuss patient's schedule and plan for visits.  Clarified visits for next week as 1x/week for PT/OT.  Once aquatic determined will need to schedule aquatic and clinic visits.  Still no issues with stairs at home.    Patient is accompained by:  Family member   daughter Edward Glass   Pertinent History  chronic rt shoulder pain; had recent fall with rt wrist pain, xray negative for fracture and had steroid injection 09/27/18    Diagnostic tests  MRI-Multiple small foci of acute/early subacute infarctions present in the left MCA distribution concentrated in left basal ganglia; Small chronic infarcts in right parietal and occipital lobes    Patient Stated Goals  improve his walking and balance, be able to live on his own again in his 2 story house    Currently in Pain?  No/denies    Pain Onset  More than a month ago                       [facility] Adult PT Treatment/Exercise -  12/13/18 1126      Neuro Re-ed    Neuro Re-ed Details   Continued NMR at countertop with side stepping to L and R with side squat reaching to R side and then across body to L with each side squat for WB through RUE.  From squat position focused on quick, powerful activation of [name] to weight shift back to L and full weight shift back to R to bring feet back together to facilitate increased speed of activation.  Also performed WB and active extension through RUE in modified plank with UE on table reaching up and around to L with LUE to facilitate trunk rotation and increased R shoulder horizontal ABD.      Knee/Hip Exercises: Aerobic   Nustep  level 5 resistance with bilat  UE and LE with therapist assisting with maintaining support under RUE and grasp of RUE.  Performed for reciprocal UE and LE activation and increased ROM of RUE.  Performed bilat UE and LE x 5 minutes.  For final 3 minutes (8 total) performed at level 7 resistance with LE only for reciprocal extension      Knee/Hip Exercises: Seated   Stool Scoot - Round Trips  for alternating hamstring and quad activation x 30' x 2 reps with multiple rest breaks.  Cues to lean trunk forwards to minimize extensor tone and for activation of hamstring to advance chair instead of using trunk flexion momentum; mod A             PT Education - 12/13/18 1303    Education Details  Continued to review plan for visits and aquatic therapy.  Reduce to 1x/week next week.      Person(s) Educated  Patient;Spouse    Methods  Explanation    Comprehension  Verbalized understanding       PT Short Term Goals - 12/07/18 1153      PT SHORT TERM GOAL #1   Title  =LTG        PT Long Term Goals - 12/07/18 1154      PT LONG TERM GOAL #1   Title  Patient will be independent with updated HEP and verbalize plan for continued community-based exercise plan.    Baseline  11/27/18: met with current HEP, will plan to progress/update in next session or 2.    Time  8    Period  Weeks    Status  On-going    Target Date  02/03/19      PT LONG TERM GOAL #2   Title  Patient will improve gait velocity to 1.30 ft/sec demonstrating progress towards decr fall risk    Baseline  11/27/18: 1.06 ft/sec with RW with supervision, cues for gait mechanics; decr'd from assessment last week on 11/24/18    Time  8    Period  Weeks    Status  On-going    Target Date  02/03/19      PT LONG TERM GOAL #3   Title  Pt will amb. short distances (approx. 24') without device for household amb. with LUE support on environmental objects as needed for assist with balance.     Time  8    Period  Weeks    Status  New    Target Date  02/03/19      PT  LONG TERM GOAL #4   Title  Patient will ambulate over outdoor paved surfaces modified independent with LRAD, including up/down curb.     Baseline  11/27/18: min  guard to min assist on paved surfaces, did not assess curb     Time  8    Period  Weeks    Status  On-going    Target Date  02/03/19      PT LONG TERM GOAL #6   Title  Patient will go up/down 12 steps with single rail modified independent     Baseline  min guard/CGA needed for safety - 11-29-18; Lt hand rail used    Time  8    Period  Weeks    Status  On-going    Target Date  02/03/19            Plan - 12/13/18 1304    Clinical Impression Statement  Continued to focus on coordination of reciprocal movement patterns and R sided strengthening with use of Nustep and rolling chair scoots with [name] only.  Also continued NMR for increased weight shifting, R sided WB and power of [name].  Pt tolerated well and is demonstrating increased LLE step length and overall increased gait velocity.    Personal Factors and Comorbidities  Comorbidity 1    Comorbidities  chronic rt shoulder pain    Examination-Activity Limitations  Bathing;Bed Mobility;Caring for Others;Carry;Dressing;Locomotion Level;Lift;Reach Overhead;Self Feeding;Stairs    Examination-Participation Restrictions  Community Activity;Driving    Stability/Clinical Decision Making  Stable/Uncomplicated    Rehab Potential  Good    PT Frequency  2x / week   1 week; pt requests to reduce frequency to 1x/week for next 2 weeks   PT Duration  3 weeks    PT Treatment/Interventions  ADLs/Self Care Home Management;Aquatic Therapy;Electrical Stimulation;DME Instruction;Gait training;Stair training;Functional mobility training;Therapeutic activities;Therapeutic exercise;Balance training;Orthotic Fit/Training;Patient/family education;Neuromuscular re-education;Wheelchair mobility training;Passive range of motion;Visual/perceptual remediation/compensation    PT Next Visit Plan  will likely see OT  in pool - needs to schedule 1x/week PT in clinic for same day as pool; gait without RW, power of [name] for quicker weight shifts and activation, [place] prn    Consulted and Agree with Plan of Care  Patient    Family Member Consulted  daughter Edward Glass       Patient will benefit from skilled therapeutic intervention in order to improve the following deficits and impairments:  Abnormal gait, Decreased activity tolerance, Decreased balance, Decreased endurance, Decreased coordination, Decreased cognition, Decreased knowledge of use of DME, Decreased mobility, Decreased safety awareness, Difficulty walking, Decreased strength, Increased edema, Impaired perceived functional ability, Impaired UE functional use  Visit Diagnosis: Other abnormalities of gait and mobility  Other symptoms and signs involving the nervous system  Hemiplegia and hemiparesis following cerebral infarction affecting right dominant side ([place])  Unsteadiness on feet     Problem List Patient Active Problem List   Diagnosis Date Noted  . Slow transit constipation   . Benign prostatic hyperplasia with urinary retention   . Right hemiparesis ([place])   . Chronic right shoulder pain   . Primary osteoarthritis of right shoulder   . Left basal ganglia embolic stroke ([place]) 70/35/0093  . CVA (cerebral vascular accident) ([place]) 08/31/2018  . Elevated aspartate aminotransferase level 06/16/2015  . H/O nutritional disorder 06/16/2015  . Excessive salivation 06/16/2015  . 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 Heart palpitations 06/16/2015  . 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 Screening for prostate cancer 08/21/2014  . Atypical chest pain 08/10/2013  . 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

## 2018-12-13 NOTE — Therapy (Signed)
[facility] [address] [place], [place], [address] Phone: [phone]   Fax:  [phone]  Occupational Therapy Treatment  Patient Details  Name: Edward Glass MRN: [id number] Date of Birth: 15-Feb-1956 Referring Provider (OT): Dr. Alysia Penna   Encounter Date: 12/13/2018  OT End of Session - 12/13/18 1725    Visit Number  19    Number of Visits  24    Date for OT Re-Evaluation  12/28/18    Authorization Type  [employer] - has 30 visits between OT and PT however if visits occur on same day counts as one visit. Pt wishes to use 23 visits and then save remaining visits for later in the year.    Authorization - Visit Number  20    Authorization - Number of Visits  24    OT Start Time  4270    OT Stop Time  1230    OT Time Calculation (min)  42 min    Activity Tolerance  Patient tolerated treatment well    Behavior During Therapy  WFL for tasks assessed/performed       Past Medical History:  Diagnosis Date  . Atypical chest pain 08/10/2013  . Elevated aspartate aminotransferase level 06/16/2015  . Excessive salivation 06/16/2015  . H/O nutritional disorder 06/16/2015  . 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 Screening for prostate cancer 08/21/2014  . 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 HERNIA REPAIR  2006  . left tricep surgery    . LOOP RECORDER INSERTION N/A 09/04/2018   Procedure: LOOP RECORDER INSERTION;  Surgeon: Evans Lance, MD;  Location: [facility];  Service: Cardiovascular;  Laterality: N/A;  . TEE WITHOUT CARDIOVERSION N/A 09/04/2018   Procedure: TRANSESOPHAGEAL ECHOCARDIOGRAM (TEE);  Surgeon: Jerline Pain, MD;  Location: [facility] ENDOSCOPY;  Service: Cardiovascular;  Laterality: N/A;    There were no vitals filed for this visit.  Subjective Assessment - 12/13/18 1730    Pertinent History  L CVA on 09/14/18  Loop  recorder, pt taking blood thinner    Patient Stated Goals  walk better and use my hand    Currently in Pain?  No/denies            Treatment:.Pt was treated in the clinic today with universal masking by staff, and pt masking with reduced number of patients in the clinic due to COVID-19. Weightbearing through right elbow and hand  hand with body on arm movements lateral trunk flexion, rotation  and weight shifts,min- mod facilitation. Standing at table, rocking forwards and backwards with bilateral UE's on table, min-mod facilitation and gentle tableslides in standing with therapist facilitating proper positioning. Reach for the floor self stretch performed , min v.c AA/ROM low range shoulder flexion and horizontal abduction/ adduction with hemiglide, min facilitation. 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                  OT Short Term Goals - 12/11/18 1259      OT SHORT TERM GOAL #1   Title  Pt and family will be mod I  with RUE positioning/care to address edema and pain in RUE - 11/23/2018 (goal adjusted as pt missed one week)    Status  Achieved      OT SHORT TERM GOAL #2   Title  Pt and family will be mod I with splint wear and care    Status  Achieved      OT SHORT TERM GOAL #3   Title  Pt and dtr will be mod I with HEP for RUE for ROM, addressing pain, functional use of RUE prn    Status  Achieved      OT SHORT TERM GOAL #4   Title  Pt will report pain in R hand/wrist no greater than 5/10 during HEP.      Status  Achieved      OT SHORT TERM GOAL #5   Title  Pt will demonstrate the ability for 55* of shoulder flexion as prep for functional low reach    Status  On-going   45-50     OT SHORT TERM GOAL #6   Title  Pt will be mod I with toilet and tub bench transfers     Status  Achieved   met per pt report     OT SHORT TERM GOAL #7   Title  Pt will be mod I with bathing at shower level    Status  Achieved   met per pt report       OT Long Term Goals - 12/11/18 1300      OT LONG TERM GOAL #1   Title  Pt and family will be mod I with upgraded home activities program - 01/05/2019 (goal adjusted as pt missed one week)    Status  On-going      OT LONG TERM GOAL #2   Title  Pt will demonstrate ability to grasp and hold light weight object in R hand with pain 2/10 or less    Status  Achieved      OT LONG TERM GOAL #3   Title  Pt will demonstrate ability for bilateral low reach to obtain light weigh object     Status  On-going      OT LONG TERM GOAL #4   Title  Pt will be mod I with dressing    Status  Achieved      OT LONG TERM GOAL #5   Title  Pt will be mod I with cutting food, AE prn    Status  Achieved      OT LONG TERM GOAL #6   Title  Pt will demonstrate ability to use RUE as stabilizer at least 75% of the time in simple ADL tasks    Status  On-going      OT LONG TERM GOAL #7   Title  Pt will be mod I with simple snack/be prep at ambulatory level    Status  On-going      OT LONG TERM GOAL #8   Title  Pt will demonstrate improved postural alignment to decrease pain in RUE as well as decrease risk of falls    Status  On-going            Plan - 12/13/18 1726    Clinical Impression Statement  Pt is progressing towards goals. 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    Occupational performance deficits (Please refer to evaluation for details):  ADL's;IADL's;Rest and Sleep;Work;Leisure;Social Participation    Body Structure / Function / Physical Skills  ADL;Balance;Decreased knowledge of use of DME;Coordination;Edema;GMC;FMC;Flexibility;IADL;Mobility;Pain;ROM;Strength;Tone;UE functional use;Endurance    Cognitive Skills  Safety Awareness    Rehab Potential  Good    OT Frequency  2x / week    OT Duration  12 weeks    OT  Treatment/Interventions  Self-care/ADL training;Aquatic Therapy;Cryotherapy;Therapeutic exercise;Neuromuscular education;Energy conservation;DME and/or AE instruction;Functional Mobility Training;Cognitive remediation/compensation;Balance training;Splinting;Manual Therapy;Passive range of motion;Therapeutic activities;Patient/family education    Plan  discuss pool program with patient and family, continue NMR    Consulted and Agree with Plan of Care  Patient;Family member/caregiver    Family Member Consulted  daughter       Patient will benefit from skilled therapeutic intervention in order to improve the following deficits and impairments:  Body Structure / Function / Physical Skills, Cognitive Skills  Visit Diagnosis: Hemiplegia and hemiparesis following cerebral infarction affecting right dominant side (HCC)  Unsteadiness on feet  Muscle weakness (generalized)  Other symptoms and signs involving the nervous system  Acute pain of right shoulder    Problem List Patient Active Problem List   Diagnosis Date Noted  . Slow transit constipation   . Benign prostatic hyperplasia with urinary retention   . Right hemiparesis ([place])   . Chronic right shoulder pain   . Primary osteoarthritis of right shoulder   . Left basal ganglia embolic stroke ([place]) 83/89/3068  . CVA (cerebral vascular accident) ([place]) 08/31/2018  . Elevated aspartate aminotransferase level 06/16/2015  . H/O nutritional disorder 06/16/2015  . Excessive salivation 06/16/2015  . 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 Heart palpitations 06/16/2015  . 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 Screening for prostate cancer 08/21/2014  . Atypical chest pain 08/10/2013  . 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 [place], [place], [address] Phone: [phone]   Fax:  [phone]  Name:  Edward Glass MRN: [id number] Date of Birth: January 02, 1956

## 2018-12-13 NOTE — Therapy (Signed)
[facility] [address] [place], [place], [address] Phone: [phone]   Fax:  [phone]  Speech Language Pathology Treatment  Patient Details  Name: Edward Glass MRN: [id number] Date of Birth: 1955-09-14 Referring Provider (SLP): Alysia Penna, MD   Encounter Date: 12/13/2018  End of Session - 12/13/18 1054    Visit Number  16    Number of Visits  21    Date for SLP Re-Evaluation  12/27/18    Authorization Type  30 visits ST (20 planned, save 10?)    Authorization - Visit Number  16    Authorization - Number of Visits  30    SLP Start Time  1062    SLP Stop Time   1100    SLP Time Calculation (min)  42 min       Past Medical History:  Diagnosis Date  . Atypical chest pain 08/10/2013  . Elevated aspartate aminotransferase level 06/16/2015  . Excessive salivation 06/16/2015  . H/O nutritional disorder 06/16/2015  . 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 Screening for prostate cancer 08/21/2014  . 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 HERNIA REPAIR  2006  . left tricep surgery    . LOOP RECORDER INSERTION N/A 09/04/2018   Procedure: LOOP RECORDER INSERTION;  Surgeon: Evans Lance, MD;  Location: [facility];  Service: Cardiovascular;  Laterality: N/A;  . TEE WITHOUT CARDIOVERSION N/A 09/04/2018   Procedure: TRANSESOPHAGEAL ECHOCARDIOGRAM (TEE);  Surgeon: Jerline Pain, MD;  Location: [facility] ENDOSCOPY;  Service: Cardiovascular;  Laterality: N/A;    There were no vitals filed for this visit.  Subjective Assessment - 12/13/18 1022    Subjective  "What is it? Eeeeee-ahhhh?" (Pt, with SLP "What is the first exercise you do?")    Patient is accompained by:  Family member   Dena   Currently in Pain?  No/denies   "I haven't moved it"           ADULT SLP TREATMENT - 12/13/18 1027      General Information   Behavior/Cognition  Alert;Cooperative   flat affect     Treatment Provided   Treatment provided  Cognitive-Linquistic      Cognitive-Linquistic Treatment   Treatment focused on  Dysarthria;Voice    Skilled Treatment  SLP utilized loud "hey- ahhhhh" to assist recalibration of conversational speech, at mid 70s dB. Pt reports people ask him to repeat average once a day. In structured tasks pt maintained abdominal breath (AB) approx 50% of the time, incr'd to 90% with SLP cues to take AB prior to each sentence. Pt sequenced 3 and 4-step card sequences without difficulty , however a 5-step sequence req'd mod SLP cues.       Assessment / Recommendations / Plan   Plan  Continue with current plan of care      Progression Toward Goals   Progression toward goals  Progressing toward goals         SLP Short Term Goals - 12/12/18 1241      SLP SHORT TERM GOAL #1   Title  Pt will undergo naming assessment and/or cognitive linguistic assessment PRN    Baseline  BNT: 10-24-18, MoCA 11-08-18    Status  Achieved      SLP SHORT TERM GOAL #2   Title  pt will demo improved conversational volume in sentence responses- average mid-upper 60s dB, over two sessions  Status  Not Met      SLP SHORT TERM GOAL #3   Title  pt will demo improved conversational volume in 5 mintues simple conversation with average mid-upper 60s dB, over three sessions    Status  Not Met      SLP SHORT TERM GOAL #4   Title  pt will demo abdominal breathing 60% of the time at rest over two sessions    Baseline  11-29-18, 12-01-18    Status  Achieved   from 'in 5 minutes conversation over three sessions"     SLP SHORT TERM GOAL #5   Title  pt will name 3 non-physical deficits over three sessions    Status  Not Met      SLP SHORT TERM GOAL #6   Title  pt/family will provide 3 overt s/s aspiration PNA with modified independence over 2 sessions    Time  1    Period  Weeks    Status  Deferred      SLP SHORT TERM GOAL #7    Title  pt will complete simple to mod complex naming tasks pertinent to pt's interests and life experience/s 85% over three sessions    Status  Not Met       SLP Long Term Goals - 12/13/18 [place] #1   Title  pt will demo improved conversational volume in 8 minutes simple conversation average upper 60s-low 70s dB, over three sessions    Time  3    Period  Weeks   or 21 total visits, for all LTGs   Status  On-going      SLP LONG TERM GOAL #2   Title  pt will demo abdominal breathing 70% of the time in 5 minutes conversation over three sessions    Time  3    Period  Weeks    Status  On-going      SLP LONG TERM GOAL #3   Title  pt will complete a functional, simple organizational task for 25 minutes demonstrating adequate selelctive attention with modified indpendence, over three sessions    Period  Weeks    Status  Deferred      SLP LONG TERM GOAL #4   Title  pt will double check answers with speech therapy tasks (anticipatory awareness) with nonverbal cues over three sessions    Time  4    Status  Deferred      SLP LONG TERM GOAL #5   Title  SLP will monitor pt to foster cont'd pulmonary health    Time  4    Period  Weeks    Status  On-going      SLP LONG TERM GOAL #6   Title  pt will demo functional word finding skills in 8 minutes simple conversation for WNL conversational fluidity over 3 sessions    Time  4    Period  Weeks    Status  Deferred      SLP LONG TERM GOAL #7   Title  pt will demo compensations for memory in 80% of opportunities    Time  4    Period  Weeks    Status  Deferred       Plan - 12/13/18 1055    Clinical Impression Statement  Moderate dysarthria persists. Pt's cognitive deficits were evident today during tx- see "skilled intervention" for more details of today's session. Focus on speech clarity continued today, as  pt believes cognitive linguistic and word finding skills are at baseline. Pt reports people ask him to repeat  average once/day - SLP suspects this is actually more than this. Pt would benefit from skilled ST to maximize intelligiblity, language, and cognitive commuication skills.     Speech Therapy Frequency  2x / week    Duration  --   10 weeks/21 visits   Treatment/Interventions  Oral motor exercises;Cueing hierarchy;Environmental controls;Compensatory techniques;Cognitive reorganization;Functional tasks;SLP instruction and feedback;Patient/family education;Internal/external aids;Aspiration precaution training;Pharyngeal strengthening exercises;Diet toleration management by SLP;Trials of upgraded texture/liquids    Potential to Achieve Goals  Good    Potential Considerations  Ability to learn/carryover information;Severity of impairments    SLP Home Exercise Plan  Loud Hey- Ah; PHoRTE    Consulted and Agree with Plan of Care  Patient       Patient will benefit from skilled therapeutic intervention in order to improve the following deficits and impairments:   Dysarthria and anarthria  Cognitive communication deficit  Aphasia  Dysphagia, oropharyngeal phase    Problem List Patient Active Problem List   Diagnosis Date Noted  . Slow transit constipation   . Benign prostatic hyperplasia with urinary retention   . Right hemiparesis ([place])   . Chronic right shoulder pain   . Primary osteoarthritis of right shoulder   . Left basal ganglia embolic stroke ([place]) 05/22/2535  . CVA (cerebral vascular accident) ([place]) 08/31/2018  . Elevated aspartate aminotransferase level 06/16/2015  . H/O nutritional disorder 06/16/2015  . Excessive salivation 06/16/2015  . 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 Heart palpitations 06/16/2015  . 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 Screening for prostate cancer 08/21/2014  . Atypical chest pain 08/10/2013  . 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 [place], [place], [address] Phone: [phone]   Fax:  [phone]   Name: Edward Glass MRN: [id number] Date of Birth: [birth year]

## 2018-12-13 NOTE — Patient Instructions (Signed)
"  Hey- ahhhhhhh" 10 reps, twice a day.

## 2018-12-19 ENCOUNTER — Ambulatory Visit: Payer: BLUE CROSS/BLUE SHIELD | Admitting: Physical Therapy

## 2018-12-19 ENCOUNTER — Encounter: Payer: BLUE CROSS/BLUE SHIELD | Admitting: Speech Pathology

## 2018-12-20 ENCOUNTER — Ambulatory Visit: Payer: BC Managed Care – PPO | Admitting: Occupational Therapy

## 2018-12-20 ENCOUNTER — Ambulatory Visit: Payer: BC Managed Care – PPO | Admitting: Physical Therapy

## 2018-12-20 ENCOUNTER — Other Ambulatory Visit: Payer: Self-pay

## 2018-12-20 ENCOUNTER — Ambulatory Visit: Payer: BC Managed Care – PPO

## 2018-12-20 ENCOUNTER — Encounter: Payer: Self-pay | Admitting: Occupational Therapy

## 2018-12-20 ENCOUNTER — Encounter: Payer: Self-pay | Admitting: Physical Therapy

## 2018-12-20 VITALS — BP 143/83 | HR 67

## 2018-12-20 DIAGNOSIS — R41841 Cognitive communication deficit: Secondary | ICD-10-CM

## 2018-12-20 DIAGNOSIS — R600 Localized edema: Secondary | ICD-10-CM

## 2018-12-20 DIAGNOSIS — R29818 Other symptoms and signs involving the nervous system: Secondary | ICD-10-CM

## 2018-12-20 DIAGNOSIS — R2681 Unsteadiness on feet: Secondary | ICD-10-CM

## 2018-12-20 DIAGNOSIS — M6281 Muscle weakness (generalized): Secondary | ICD-10-CM

## 2018-12-20 DIAGNOSIS — I69351 Hemiplegia and hemiparesis following cerebral infarction affecting right dominant side: Secondary | ICD-10-CM

## 2018-12-20 DIAGNOSIS — G8929 Other chronic pain: Secondary | ICD-10-CM

## 2018-12-20 DIAGNOSIS — M25531 Pain in right wrist: Secondary | ICD-10-CM

## 2018-12-20 DIAGNOSIS — I69318 Other symptoms and signs involving cognitive functions following cerebral infarction: Secondary | ICD-10-CM

## 2018-12-20 DIAGNOSIS — R471 Dysarthria and anarthria: Secondary | ICD-10-CM

## 2018-12-20 DIAGNOSIS — R1312 Dysphagia, oropharyngeal phase: Secondary | ICD-10-CM

## 2018-12-20 DIAGNOSIS — M25511 Pain in right shoulder: Secondary | ICD-10-CM

## 2018-12-20 DIAGNOSIS — R293 Abnormal posture: Secondary | ICD-10-CM

## 2018-12-20 DIAGNOSIS — R4701 Aphasia: Secondary | ICD-10-CM

## 2018-12-20 DIAGNOSIS — R2689 Other abnormalities of gait and mobility: Secondary | ICD-10-CM

## 2018-12-20 DIAGNOSIS — M79641 Pain in right hand: Secondary | ICD-10-CM

## 2018-12-20 NOTE — Therapy (Signed)
[facility] [address] [place], [place], [address] Phone: [phone]   Fax:  [phone]  Speech Language Pathology Treatment  Patient Details  Name: Edward Glass MRN: [id number] Date of Birth: 1956/06/11 Referring Provider (SLP): Alysia Penna, MD   Encounter Date: 12/20/2018  End of Session - 12/20/18 1254    Visit Number  17    Number of Visits  21    Date for SLP Re-Evaluation  12/27/18    Authorization Type  30 visits ST (20 planned, save 10?)    Authorization - Visit Number  17    Authorization - Number of Visits  30    SLP Start Time  9381    SLP Stop Time   1230    SLP Time Calculation (min)  41 min    Activity Tolerance  Patient tolerated treatment well       Past Medical History:  Diagnosis Date  . Atypical chest pain 08/10/2013  . Elevated aspartate aminotransferase level 06/16/2015  . Excessive salivation 06/16/2015  . H/O nutritional disorder 06/16/2015  . 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 Screening for prostate cancer 08/21/2014  . 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 HERNIA REPAIR  2006  . left tricep surgery    . LOOP RECORDER INSERTION N/A 09/04/2018   Procedure: LOOP RECORDER INSERTION;  Surgeon: Evans Lance, MD;  Location: [facility];  Service: Cardiovascular;  Laterality: N/A;  . TEE WITHOUT CARDIOVERSION N/A 09/04/2018   Procedure: TRANSESOPHAGEAL ECHOCARDIOGRAM (TEE);  Surgeon: Jerline Pain, MD;  Location: [facility] ENDOSCOPY;  Service: Cardiovascular;  Laterality: N/A;    There were no vitals filed for this visit.  Subjective Assessment - 12/20/18 1153    Subjective  Pt reports he thinks his speech is louder and more intelligible than last week.     Patient is accompained by:  Family member   Edward Glass   Currently in Pain?  No/denies            ADULT SLP TREATMENT - 12/20/18 1154      General Information   Behavior/Cognition  Alert;Cooperative   flat affect     Treatment Provided   Treatment provided  Cognitive-Linquistic      Cognitive-Linquistic Treatment   Treatment focused on  Dysarthria;Voice    Skilled Treatment  Pt states "I enunciate better" as the reason for stronger voice and more intelligible speech. SLP used pt's loud "hey- ahhhh" to elicit louder conversational speech with average in upper 80s dB. Pt read everyday sentences with min-mod A (for a lower pitch) due to using a higher pitch out of his speaking range. After three consecutive cues pt began using a pitch more within his speaking range than in a singing range. He read bodybuilder bios with mod cues for abdominal breathing. Aphasic errors were consistent throughout the readings, however pt maintains his language/word choice are at pre-CVA baseline.      Assessment / Recommendations / Plan   Plan  Continue with current plan of care   probable d/c next session     Progression Toward Goals   Progression toward goals  Progressing toward goals   pt cognition/awareness making further progress difficult        SLP Short Term Goals - 12/12/18 1241      SLP SHORT TERM GOAL #1   Title  Pt will undergo naming assessment and/or cognitive linguistic  assessment PRN    Baseline  BNT: 10-24-18, MoCA 11-08-18    Status  Achieved      SLP SHORT TERM GOAL #2   Title  pt will demo improved conversational volume in sentence responses- average mid-upper 60s dB, over two sessions    Status  Not Met      SLP SHORT TERM GOAL #3   Title  pt will demo improved conversational volume in 5 mintues simple conversation with average mid-upper 60s dB, over three sessions    Status  Not Met      SLP SHORT TERM GOAL #4   Title  pt will demo abdominal breathing 60% of the time at rest over two sessions    Baseline  11-29-18, 12-01-18    Status  Achieved   from 'in 5 minutes conversation over three sessions"     SLP SHORT TERM  GOAL #5   Title  pt will name 3 non-physical deficits over three sessions    Status  Not Met      SLP SHORT TERM GOAL #6   Title  pt/family will provide 3 overt s/s aspiration PNA with modified independence over 2 sessions    Time  1    Period  Weeks    Status  Deferred      SLP SHORT TERM GOAL #7   Title  pt will complete simple to mod complex naming tasks pertinent to pt's interests and life experience/s 85% over three sessions    Status  Not Met       SLP Long Term Goals - 12/20/18 1700      SLP LONG TERM GOAL #1   Title  pt will demo improved conversational volume in 8 minutes simple conversation average upper 60s-low 70s dB, over three sessions    Time  2    Period  Weeks   or 21 total visits, for all LTGs   Status  On-going      SLP LONG TERM GOAL #2   Title  pt will demo abdominal breathing 70% of the time in 5 minutes conversation over three sessions    Time  2    Period  Weeks    Status  On-going      SLP LONG TERM GOAL #3   Title  pt will complete a functional, simple organizational task for 25 minutes demonstrating adequate selelctive attention with modified indpendence, over three sessions    Period  Weeks    Status  Deferred      SLP LONG TERM GOAL #4   Title  pt will double check answers with speech therapy tasks (anticipatory awareness) with nonverbal cues over three sessions    Time  3    Status  Deferred      SLP LONG TERM GOAL #5   Title  SLP will monitor pt to foster cont'd pulmonary health    Time  3    Period  Weeks    Status  On-going      SLP LONG TERM GOAL #6   Title  pt will demo functional word finding skills in 8 minutes simple conversation for WNL conversational fluidity over 3 sessions    Time  3    Period  Weeks    Status  Deferred      SLP LONG TERM GOAL #7   Title  pt will demo compensations for memory in 80% of opportunities    Time  3    Period  Weeks  Status  Deferred       Plan - 12/20/18 1255    Clinical Impression  Statement  Moderate dysarthria persists. Pt's cognitive deficits were evident today during tx- see "skilled intervention" for more details of today's session. SLP wonders if ENT consult is prudent given pt's cont'd vocal insufficiency. Pt reports he feels and thinks his voice is stronger from last week and states overarticulation is what is making it stronger and clearer. Focus on speech clarity continued today, as pt believes cognitive linguistic and word finding skills are at baseline. Pt reports people ask him to repeat average once/day - SLP suspects this is actually more than this. Pt would benefit from skilled ST to maximize intelligiblity, language, and cognitive commuication skills.   (Pended)     Speech Therapy Frequency  2x / week  (Pended)     Duration  --  (Pended)    10 weeks/21 visits   Treatment/Interventions  Oral motor exercises;Cueing hierarchy;Environmental controls;Compensatory techniques;Cognitive reorganization;Functional tasks;SLP instruction and feedback;Patient/family education;Internal/external aids;Aspiration precaution training;Pharyngeal strengthening exercises;Diet toleration management by SLP;Trials of upgraded texture/liquids  (Pended)     Potential to Achieve Goals  Good  (Pended)     Potential Considerations  Ability to learn/carryover information;Severity of impairments  (Pended)     SLP Home Exercise Plan  Loud Hey- Ah; PHoRTE  (Pended)     Consulted and Agree with Plan of Care  Patient  (Pended)        Patient will benefit from skilled therapeutic intervention in order to improve the following deficits and impairments:   Dysarthria and anarthria  Cognitive communication deficit  Aphasia  Dysphagia, oropharyngeal phase    Problem List Patient Active Problem List   Diagnosis Date Noted  . Slow transit constipation   . Benign prostatic hyperplasia with urinary retention   . Right hemiparesis ([place])   . Chronic right shoulder pain   . Primary  osteoarthritis of right shoulder   . Left basal ganglia embolic stroke ([place]) 09/40/7680  . CVA (cerebral vascular accident) ([place]) 08/31/2018  . Elevated aspartate aminotransferase level 06/16/2015  . H/O nutritional disorder 06/16/2015  . Excessive salivation 06/16/2015  . 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 Heart palpitations 06/16/2015  . 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 Screening for prostate cancer 08/21/2014  . Atypical chest pain 08/10/2013  . 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

## 2018-12-20 NOTE — Patient Instructions (Signed)
Practice abdominal breathing 15 minutes twice a day.

## 2018-12-20 NOTE — Addendum Note (Signed)
Addended by: Rivka Barbara on: 12/20/2018 02:22 PM   Modules accepted: Orders

## 2018-12-20 NOTE — Therapy (Signed)
[facility] [address] [place], [place], [address] Phone: [phone]   Fax:  [phone]  Occupational Therapy Treatment  Patient Details  Name: Edward Glass MRN: [id number] Date of Birth: 09/10/1955 Referring Provider (OT): Dr. Alysia Penna   Encounter Date: 12/20/2018  OT End of Session - 12/20/18 1357    Visit Number  20    Number of Visits  25    Date for OT Re-Evaluation  12/28/18    Authorization Type  [employer] - has 30 visits between OT and PT however if visits occur on same day counts as one visit. Pt wishes to use 23 visits and then save remaining visits for later in the year.    Authorization - Visit Number  21    Authorization - Number of Visits  25    OT Start Time  1017    OT Stop Time  1100    OT Time Calculation (min)  43 min    Activity Tolerance  Patient tolerated treatment well    Behavior During Therapy  WFL for tasks assessed/performed       Past Medical History:  Diagnosis Date  . Atypical chest pain 08/10/2013  . Elevated aspartate aminotransferase level 06/16/2015  . Excessive salivation 06/16/2015  . H/O nutritional disorder 06/16/2015  . 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 Screening for prostate cancer 08/21/2014  . 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 HERNIA REPAIR  2006  . left tricep surgery    . LOOP RECORDER INSERTION N/A 09/04/2018   Procedure: LOOP RECORDER INSERTION;  Surgeon: Evans Lance, MD;  Location: [facility];  Service: Cardiovascular;  Laterality: N/A;  . TEE WITHOUT CARDIOVERSION N/A 09/04/2018   Procedure: TRANSESOPHAGEAL ECHOCARDIOGRAM (TEE);  Surgeon: Jerline Pain, MD;  Location: [facility];  Service: Cardiovascular;  Laterality: N/A;    There were no vitals filed for this visit.  Subjective Assessment - 12/20/18 1023    Subjective   I would like to do the pool therapy     Patient is accompanied by:  Family member   girlfriend   Pertinent History  L CVA on 09/14/18  Loop recorder, pt taking blood thinner    Patient Stated Goals  walk better and use my hand    Currently in Pain?  No/denies                   OT Treatments/Exercises (OP) - 12/20/18 1348      ADLs   ADL Comments  Discussed possibly addition of aquatic therapy and pt is interested in pursuing. Also addressed use of remaining visits - pt now wishes to use 25 visits in order to incorpoate aquatic therapy.        Neurological Re-education Exercises   Other Exercises 1  Neuro re ed in sidelying to address low to mid reach patterns using UE ranger - pt continues to need max facilitation and cueing to avoid hiking shoulder and flexing elbow during forwarrd reach due to apraxia, impaired sensation and hemiplegia.  By end of session in sidelying using moveable surface pt could produce low to mid reach pattern with intermittent light faciltation and cuering.                 OT Short Term Goals - 12/20/18 1351      OT SHORT TERM GOAL #1   Title  Pt and  family will be mod I  with RUE positioning/care to address edema and pain in RUE - 11/23/2018 (goal adjusted as pt missed one week)    Status  Achieved      OT SHORT TERM GOAL #2   Title  Pt and family will be mod I with splint wear and care    Status  Achieved      OT SHORT TERM GOAL #3   Title  Pt and dtr will be mod I with HEP for RUE for ROM, addressing pain, functional use of RUE prn    Status  Achieved      OT SHORT TERM GOAL #4   Title  Pt will report pain in R hand/wrist no greater than 5/10 during HEP.      Status  Achieved      OT SHORT TERM GOAL #5   Title  Pt will demonstrate the ability for 55* of shoulder flexion as prep for functional low reach    Status  On-going   45-50     OT SHORT TERM GOAL #6   Title  Pt will be mod I with toilet and tub bench transfers    Status  Achieved   met per pt report      OT SHORT TERM GOAL #7   Title  Pt will be mod I with bathing at shower level    Status  Achieved   met per pt report       OT Long Term Goals - 12/20/18 1351      OT LONG TERM GOAL #1   Title  Pt and family will be mod I with upgraded home activities program - 01/05/2019 (goal adjusted as pt missed one week)    Status  On-going      OT LONG TERM GOAL #2   Title  Pt will demonstrate ability to grasp and hold light weight object in R hand with pain 2/10 or less    Status  Achieved      OT LONG TERM GOAL #3   Title  Pt will demonstrate ability for bilateral low reach to obtain light weigh object     Status  On-going      OT LONG TERM GOAL #4   Title  Pt will be mod I with dressing    Status  Achieved      OT LONG TERM GOAL #5   Title  Pt will be mod I with cutting food, AE prn    Status  Achieved      OT LONG TERM GOAL #6   Title  Pt will demonstrate ability to use RUE as stabilizer at least 75% of the time in simple ADL tasks    Status  On-going      OT LONG TERM GOAL #7   Title  Pt will be mod I with simple snack/be prep at ambulatory level    Status  Achieved      OT LONG TERM GOAL #8   Title  Pt will demonstrate improved postural alignment to decrease pain in RUE as well as decrease risk of falls    Status  On-going            Plan - 12/20/18 1353    Clinical Impression Statement  Pt progressing slowly toward goals.  Pt has agreed to begin aquatic therapy as part of POC    Occupational performance deficits (Please refer to evaluation for details):  ADL's;IADL's;Rest and Sleep;Work;Leisure;Social Participation      Body Structure / Function / Physical Skills  ADL;Balance;Decreased knowledge of use of DME;Coordination;Edema;GMC;FMC;Flexibility;IADL;Mobility;Pain;ROM;Strength;Tone;UE functional use;Endurance    Cognitive Skills  Safety Awareness    Rehab Potential  Good    Clinical Decision Making  Multiple treatment options, significant modification of task  necessary    Comorbidities Affecting Occupational Performance:  Presence of comorbidities impacting occupational performance    Comorbidities impacting occupational performance description:  HTN, atypical chest pain, chronic R shoulder pain, OA of R shoulder, aphasia, apraxia, poor sensation    Modification or Assistance to Complete Evaluation   Max significant modification of tasks or assist is necessary to complete    OT Frequency  2x / week    OT Duration  12 weeks    OT Treatment/Interventions  Self-care/ADL training;Aquatic Therapy;Cryotherapy;Therapeutic exercise;Neuromuscular education;Energy conservation;DME and/or AE instruction;Functional Mobility Training;Cognitive remediation/compensation;Balance training;Splinting;Manual Therapy;Passive range of motion;Therapeutic activities;Patient/family education    Plan  pt to drop frequency to 1x/wk with remaining OT appts for aquatic therapy (pt to see PT in clinic same day), NMR for RUE reach and functional use, postural alignment, balance and functional mobility    Consulted and Agree with Plan of Care  Patient;Family member/caregiver    Family Member Consulted  girlfriend       Patient will benefit from skilled therapeutic intervention in order to improve the following deficits and impairments:  Body Structure / Function / Physical Skills, Cognitive Skills  Visit Diagnosis: Other symptoms and signs involving the nervous system  Hemiplegia and hemiparesis following cerebral infarction affecting right dominant side (HCC)  Unsteadiness on feet  Muscle weakness (generalized)  Acute pain of right shoulder  Abnormal posture  Other symptoms and signs involving cognitive functions following cerebral infarction  Pain in right hand  Localized edema  Pain in right wrist  Chronic right shoulder pain    Problem List Patient Active Problem List   Diagnosis Date Noted  . Slow transit constipation   . Benign prostatic hyperplasia  with urinary retention   . Right hemiparesis (HCC)   . Chronic right shoulder pain   . Primary osteoarthritis of right shoulder   . Left basal ganglia embolic stroke (HCC) 09/05/2018  . CVA (cerebral vascular accident) (HCC) 08/31/2018  . Elevated aspartate aminotransferase level 06/16/2015  . H/O nutritional disorder 06/16/2015  . Excessive salivation 06/16/2015  . 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 Heart palpitations 06/16/2015  . 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 Screening for prostate cancer 08/21/2014  . Atypical chest pain 08/10/2013  . 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 

## 2018-12-21 NOTE — Progress Notes (Signed)
[facility] Summary Report / Loop Recorder 

## 2018-12-21 NOTE — Therapy (Signed)
[facility] [address] [place], [place], [address] Phone: [phone]   Fax:  [phone]  Physical Therapy Treatment  Patient Details  Name: Edward Glass MRN: [id number] Date of Birth: 04/20/1956 Referring Provider (PT): Kirsteins, Andrew MD   Encounter Date: 12/20/2018   CLINIC OPERATION CHANGES: [facility] is operating at a low capacity due to COVID-19.  The patient was brought into the clinic for evaluation and/or treatment following universal masking by staff, social distancing, and <10 people in the clinic.  The patient's COVID risk of complications score is 3.   PT End of Session - 12/21/18 1248    Visit Number  20    Number of Visits  27   21 visits to be completed (based on pt's request to save 8 visits for remainder of calendar yr)   Date for PT Re-Evaluation  02/03/19    Authorization Type  BCBS; met $7500 deductible; VL: PT/OT 30, seen on same day =1 visit; SLP 30 visits    Authorization Time Period  VL IS A HARD MAX    Authorization - Visit Number  21    Authorization - Number of Visits  30    PT Start Time  1104    PT Stop Time  1143    PT Time Calculation (min)  39 min    Activity Tolerance  Patient tolerated treatment well    Behavior During Therapy  WFL for tasks assessed/performed       Past Medical History:  Diagnosis Date  . Atypical chest pain 08/10/2013  . Elevated aspartate aminotransferase level 06/16/2015  . Excessive salivation 06/16/2015  . H/O nutritional disorder 06/16/2015  . 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 Screening for prostate cancer 08/21/2014  . 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 HERNIA REPAIR  2006  . left tricep surgery    . LOOP RECORDER INSERTION N/A 09/04/2018   Procedure: LOOP RECORDER INSERTION;  Surgeon: Taylor, Gregg W, MD;  Location: [facility];  Service: Cardiovascular;  Laterality: N/A;  . TEE WITHOUT CARDIOVERSION N/A 09/04/2018   Procedure: TRANSESOPHAGEAL ECHOCARDIOGRAM (TEE);  Surgeon: Skains, Mark C, MD;  Location: [facility] ENDOSCOPY;  Service: Cardiovascular;  Laterality: N/A;    Vitals:   12/20/18 1113  BP: (!) 143/83  Pulse: 67    Subjective Assessment - 12/20/18 1107    Subjective  Doing well; OT discussed pool schedule with pt and significant other.  Pt to continue with 4 more visits, land PT and aquatic OT.  Pt asking about use of Bioness.    Patient is accompained by:  Family member   daughter Rachel   Pertinent History  chronic rt shoulder pain; had recent fall with rt wrist pain, xray negative for fracture and had steroid injection 09/27/18    Diagnostic tests  MRI-Multiple small foci of acute/early subacute infarctions present in the left MCA distribution concentrated in left basal ganglia; Small chronic infarcts in right parietal and occipital lobes    Patient Stated Goals  improve his walking and balance, be able to live on his own again in his 2 story house    Currently in Pain?  No/denies    Pain Onset  More than a month ago                       [facility] Adult PT Treatment/Exercise - 12/20/18   1113      Ambulation/Gait   Ambulation/Gait  Yes    Ambulation/Gait Assistance  4: Min assist    Ambulation/Gait Assistance Details  without use of AD on level surfaces with therapist on R side providing cues for arms swing, weight shifting and increased stance time on [name]    Ambulation Distance (Feet)  115 Feet    Assistive device  None    Gait Pattern  Step-through pattern;Decreased stance time - right;Decreased hip/knee flexion - right;Decreased weight shift to right;Decreased arm swing - right;Decreased arm swing - left    Ambulation Surface  Level;Indoor      Neuro Re-ed    Neuro Re-ed Details   NMR in // bars for bilat UE support and WB through RUE while performing LLE step up with forward lunge on  4" step to increase activation of [name] hip and knee extension, increased anterior weight shifting and to increase R hip ROM for hip extension.  Cued pt to bring weight forwards through pelvis instead of trunk flexion.  Cued to lift trunk instead of using neck extension.  Transitioned to R lateral step up and lateral lunge to 4" step for R lateral weight shifting with and without LUE support for increased WB through R side; cued to power off of and push off [name] back to midline.  Performed 10 reps of each exercise with min A from therapist.      Knee/Hip Exercises: Aerobic   Tread Mill  treadmill x 8 minutes at 0.5 mph with bilat UE support and therapist assisting on R side.  Therapist provided verbal cues for increased weight shift to [name], increased stance time on [name] with full terminal hip extension prior to advancement, increase step length LLE and provided assistance for full clearance of [name] with verbal cues to "lift and bring knee forwards"               PT Short Term Goals - 12/07/18 1153      PT SHORT TERM GOAL #1   Title  =LTG        PT Long Term Goals - 12/07/18 1154      PT LONG TERM GOAL #1   Title  Patient will be independent with updated HEP and verbalize plan for continued community-based exercise plan.    Baseline  11/27/18: met with current HEP, will plan to progress/update in next session or 2.    Time  8    Period  Weeks    Status  On-going    Target Date  02/03/19      PT LONG TERM GOAL #2   Title  Patient will improve gait velocity to 1.30 ft/sec demonstrating progress towards decr fall risk    Baseline  11/27/18: 1.06 ft/sec with RW with supervision, cues for gait mechanics; decr'd from assessment last week on 11/24/18    Time  8    Period  Weeks    Status  On-going    Target Date  02/03/19      PT LONG TERM GOAL #3   Title  Pt will amb. short distances (approx. 20') without device for household amb. with LUE support on environmental objects as needed for assist  with balance.     Time  8    Period  Weeks    Status  New    Target Date  02/03/19      PT LONG TERM GOAL #4   Title  Patient will ambulate over outdoor paved   surfaces modified independent with LRAD, including up/down curb.     Baseline  11/27/18: min guard to min assist on paved surfaces, did not assess curb     Time  8    Period  Weeks    Status  On-going    Target Date  02/03/19      PT LONG TERM GOAL #6   Title  Patient will go up/down 12 steps with single rail modified independent     Baseline  min guard/CGA needed for safety - 11-29-18; Lt hand rail used    Time  8    Period  Weeks    Status  On-going    Target Date  02/03/19            Plan - 12/21/18 1248    Clinical Impression Statement  Continued NMR and gait training to focus on repetitive sequencing and increasing gait velocity with treadmill.  Also continued NMR with UE and LE forced WB and timing of activation.  Pt able to carry over improved gait sequence to gait with and without RW.  Will continue to address and progress towards LTG.    Personal Factors and Comorbidities  Comorbidity 1    Comorbidities  chronic rt shoulder pain    Examination-Activity Limitations  Bathing;Bed Mobility;Caring for Others;Carry;Dressing;Locomotion Level;Lift;Reach Overhead;Self Feeding;Stairs    Examination-Participation Restrictions  Community Activity;Driving    Stability/Clinical Decision Making  Stable/Uncomplicated    Rehab Potential  Good    PT Frequency  2x / week   1 week; pt requests to reduce frequency to 1x/week for next 2 weeks   PT Duration  3 weeks    PT Treatment/Interventions  ADLs/Self Care Home Management;Aquatic Therapy;Electrical Stimulation;DME Instruction;Gait training;Stair training;Functional mobility training;Therapeutic activities;Therapeutic exercise;Balance training;Orthotic Fit/Training;Patient/family education;Neuromuscular re-education;Wheelchair mobility training;Passive range of  motion;Visual/perceptual remediation/compensation    PT Next Visit Plan  treadmill for gait training, stair negotiation training - step over step but stay close to pt, gait without RW, power of [name] for quicker weight shifts and activation, Bioness prn    Consulted and Agree with Plan of Care  Patient    Family Member Consulted  [name]       Patient will benefit from skilled therapeutic intervention in order to improve the following deficits and impairments:  Abnormal gait, Decreased activity tolerance, Decreased balance, Decreased endurance, Decreased coordination, Decreased cognition, Decreased knowledge of use of DME, Decreased mobility, Decreased safety awareness, Difficulty walking, Decreased strength, Increased edema, Impaired perceived functional ability, Impaired UE functional use  Visit Diagnosis: Other abnormalities of gait and mobility  Hemiplegia and hemiparesis following cerebral infarction affecting right dominant side ([place])  Other symptoms and signs involving the nervous system  Unsteadiness on feet     Problem List Patient Active Problem List   Diagnosis Date Noted  . Slow transit constipation   . Benign prostatic hyperplasia with urinary retention   . Right hemiparesis ([place])   . Chronic right shoulder pain   . Primary osteoarthritis of right shoulder   . Left basal ganglia embolic stroke ([place]) 97/35/3299  . CVA (cerebral vascular accident) ([place]) 08/31/2018  . Elevated aspartate aminotransferase level 06/16/2015  . H/O nutritional disorder 06/16/2015  . Excessive salivation 06/16/2015  . 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 Heart palpitations 06/16/2015  . 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 Screening for prostate cancer 08/21/2014  . Atypical chest pain 08/10/2013  . 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   

## 2018-12-22 ENCOUNTER — Ambulatory Visit: Payer: BC Managed Care – PPO

## 2018-12-22 ENCOUNTER — Ambulatory Visit: Payer: BLUE CROSS/BLUE SHIELD | Admitting: Physical Therapy

## 2018-12-22 ENCOUNTER — Encounter: Payer: BLUE CROSS/BLUE SHIELD | Admitting: Occupational Therapy

## 2018-12-26 ENCOUNTER — Encounter: Payer: BLUE CROSS/BLUE SHIELD | Admitting: Speech Pathology

## 2018-12-27 ENCOUNTER — Ambulatory Visit: Payer: BC Managed Care – PPO | Attending: Physician Assistant | Admitting: Occupational Therapy

## 2018-12-27 ENCOUNTER — Encounter: Payer: Self-pay | Admitting: Occupational Therapy

## 2018-12-27 ENCOUNTER — Ambulatory Visit: Payer: BC Managed Care – PPO | Admitting: Rehabilitative and Restorative Service Providers"

## 2018-12-27 ENCOUNTER — Ambulatory Visit: Payer: BC Managed Care – PPO

## 2018-12-27 ENCOUNTER — Other Ambulatory Visit: Payer: Self-pay

## 2018-12-27 DIAGNOSIS — R41841 Cognitive communication deficit: Secondary | ICD-10-CM

## 2018-12-27 DIAGNOSIS — R1312 Dysphagia, oropharyngeal phase: Secondary | ICD-10-CM

## 2018-12-27 DIAGNOSIS — R471 Dysarthria and anarthria: Secondary | ICD-10-CM | POA: Insufficient documentation

## 2018-12-27 DIAGNOSIS — R4701 Aphasia: Secondary | ICD-10-CM | POA: Diagnosis present

## 2018-12-27 DIAGNOSIS — M6281 Muscle weakness (generalized): Secondary | ICD-10-CM

## 2018-12-27 DIAGNOSIS — G8929 Other chronic pain: Secondary | ICD-10-CM | POA: Insufficient documentation

## 2018-12-27 DIAGNOSIS — I69351 Hemiplegia and hemiparesis following cerebral infarction affecting right dominant side: Secondary | ICD-10-CM

## 2018-12-27 DIAGNOSIS — R293 Abnormal posture: Secondary | ICD-10-CM

## 2018-12-27 DIAGNOSIS — I69318 Other symptoms and signs involving cognitive functions following cerebral infarction: Secondary | ICD-10-CM

## 2018-12-27 DIAGNOSIS — R2689 Other abnormalities of gait and mobility: Secondary | ICD-10-CM | POA: Diagnosis present

## 2018-12-27 DIAGNOSIS — M25511 Pain in right shoulder: Secondary | ICD-10-CM

## 2018-12-27 DIAGNOSIS — M79641 Pain in right hand: Secondary | ICD-10-CM

## 2018-12-27 DIAGNOSIS — R2681 Unsteadiness on feet: Secondary | ICD-10-CM

## 2018-12-27 DIAGNOSIS — R29818 Other symptoms and signs involving the nervous system: Secondary | ICD-10-CM | POA: Diagnosis present

## 2018-12-27 NOTE — Therapy (Signed)
[facility] [address] [place], [place], [address] Phone: [phone]   Fax:  [phone]  Speech Language Pathology Treatment  Patient Details  Name: Edward Glass MRN: [id number] Date of Birth: 13-Feb-1956 Referring Provider (SLP): Alysia Penna, MD   Encounter Date: 12/27/2018  End of Session - 12/27/18 1717    Visit Number  18    Number of Visits  21    Date for SLP Re-Evaluation  12/27/18    Authorization Type  30 visits ST (20 planned, save 10?)    Authorization - Visit Number  18    Authorization - Number of Visits  30    SLP Start Time  0907    SLP Stop Time   0945    SLP Time Calculation (min)  38 min    Activity Tolerance  Patient tolerated treatment well       Past Medical History:  Diagnosis Date  . Atypical chest pain 08/10/2013  . Elevated aspartate aminotransferase level 06/16/2015  . Excessive salivation 06/16/2015  . H/O nutritional disorder 06/16/2015  . 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 Screening for prostate cancer 08/21/2014  . 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 HERNIA REPAIR  2006  . left tricep surgery    . LOOP RECORDER INSERTION N/A 09/04/2018   Procedure: LOOP RECORDER INSERTION;  Surgeon: Evans Lance, MD;  Location: [facility];  Service: Cardiovascular;  Laterality: N/A;  . TEE WITHOUT CARDIOVERSION N/A 09/04/2018   Procedure: TRANSESOPHAGEAL ECHOCARDIOGRAM (TEE);  Surgeon: Jerline Pain, MD;  Location: [facility] ENDOSCOPY;  Service: Cardiovascular;  Laterality: N/A;    There were no vitals filed for this visit.  Subjective Assessment - 12/27/18 1003    Subjective  Pt went to restroom at 1001 and entered room at 1006.    Patient is accompained by:  --   Dena           ADULT SLP TREATMENT - 12/27/18 1006      General Information   Behavior/Cognition  Alert;Cooperative   flat  affect     Treatment Provided   Treatment provided  Cognitive-Linquistic      Dysphagia Treatment   Other treatment/comments  Pt thinks swallowing is at baseline, upon direct questioning. He denies throat clearing, coughing, choking, and decr'd pharyngeal clearance. Dena remarked she notices pt clears his throat "silghtly" during meals. SLP reminded pt and Dena of handout for overt s/sx aspiration PNA.       Cognitive-Linquistic Treatment   Treatment focused on  Dysarthria;Voice    Skilled Treatment  SLP encouraged pt to talk with PCP about a ENT referral for voice if voice does not improve by August 2020. Pt reports his speech mostly unchanged from last session; Dena states two people have told pt in last week that they can understand him better. Pt denied questions re: PhorTE exercises. In a 14-minute conversation about pool and pool cues (mod complex), pt req'd usual min-mod cues for speech intelligibilty faded to rare min cues. A second conversation of 13 minutes regarding differences in pool games resulted in SLP mod cues rarely progressing to occasional cues, presumably due to fatigue.       Assessment / Recommendations / Plan   Plan  Discharge SLP treatment due to (comment)   pt has reached max potential     Progression Toward Goals   Progression toward goals  --  see goal update-d/c day      SLP Education - 12/27/18 1715    Education Details  overt s/sx aspiration PNA, cont PhorTE exercises, ENT eval recommended in August if voice has not improved     Person(s) Educated  Patient;Caregiver(s)    Methods  Explanation;Verbal cues    Comprehension  Verbalized understanding       SLP Short Term Goals - 12/12/18 1241      SLP SHORT TERM GOAL #1   Title  Pt will undergo naming assessment and/or cognitive linguistic assessment PRN    Baseline  BNT: 10-24-18, [facility] 11-08-18    Status  Achieved      SLP SHORT TERM GOAL #2   Title  pt will demo improved conversational volume in  sentence responses- average mid-upper 60s dB, over two sessions    Status  Not Met      SLP SHORT TERM GOAL #3   Title  pt will demo improved conversational volume in 5 mintues simple conversation with average mid-upper 60s dB, over three sessions    Status  Not Met      SLP SHORT TERM GOAL #4   Title  pt will demo abdominal breathing 60% of the time at rest over two sessions    Baseline  11-29-18, 12-01-18    Status  Achieved   from 'in 5 minutes conversation over three sessions"     SLP SHORT TERM GOAL #5   Title  pt will name 3 non-physical deficits over three sessions    Status  Not Met      SLP SHORT TERM GOAL #6   Title  pt/family will provide 3 overt s/s aspiration PNA with modified independence over 2 sessions    Time  1    Period  Weeks    Status  Deferred      SLP SHORT TERM GOAL #7   Title  pt will complete simple to mod complex naming tasks pertinent to pt's interests and life experience/s 85% over three sessions    Status  Not Met       SLP Long Term Goals - 12/27/18 1036      SLP LONG TERM GOAL #1   Title  pt will demo improved conversational volume in 8 minutes simple conversation average upper 60s-low 70s dB, over three sessions    Period  --   or 21 total visits, for all LTGs   Status  Not Met   pt req'd cues from session to session     SLP LONG TERM GOAL #2   Title  pt will demo abdominal breathing 70% of the time in 5 minutes conversation over three sessions    Status  Not Met      SLP LONG TERM GOAL #3   Title  pt will complete a functional, simple organizational task for 25 minutes demonstrating adequate selelctive attention with modified indpendence, over three sessions    Period  Weeks    Status  Deferred      SLP LONG TERM GOAL #4   Title  pt will double check answers with speech therapy tasks (anticipatory awareness) with nonverbal cues over three sessions    Time  3    Status  Deferred      SLP LONG TERM GOAL #5   Title  SLP will monitor pt  to foster cont'd pulmonary health    Status  Achieved      SLP LONG TERM GOAL #6  Title  pt will demo functional word finding skills in 8 minutes simple conversation for WNL conversational fluidity over 3 sessions    Time  3    Period  Weeks    Status  Deferred      SLP LONG TERM GOAL #7   Title  pt will demo compensations for memory in 80% of opportunities    Time  3    Period  Weeks    Status  Deferred       Plan - 12/27/18 1717    Clinical Impression Statement  Moderate dysarthria persists. Pt's cognitive deficits were evident today during tx, in that pt did not appear to note language deficits in his conversational speech. See "skilled intervention" for more details of today's session. SLP believes pt has approximately reached his rehab potenial at this time if pt cont unwilling or unable to improve awareness of full slate of deficits (cognitive and language inlcuded). Pt is pleased with his current level and wishes to save some covered visits for later in the year if needed. Focus on speech clarity continued today, as pt believes cognitive linguistic and word finding skills are at baseline. Pt will be d/c'd today.     Speech Therapy Frequency  2x / week    Treatment/Interventions  Oral motor exercises;Cueing hierarchy;Environmental controls;Compensatory techniques;Cognitive reorganization;Functional tasks;SLP instruction and feedback;Patient/family education;Internal/external aids;Aspiration precaution training;Pharyngeal strengthening exercises;Diet toleration management by SLP;Trials of upgraded texture/liquids    Potential Considerations  Ability to learn/carryover information;Severity of impairments    Consulted and Agree with Plan of Care  Patient       Patient will benefit from skilled therapeutic intervention in order to improve the following deficits and impairments:   Dysarthria and anarthria  Cognitive communication deficit  Aphasia  Dysphagia, oropharyngeal phase    SPEECH THERAPY DISCHARGE SUMMARY  Visits from Start of Care: 18  Current functional level related to goals / functional outcomes: Please see LTGs above. Pt cont to deny cognitive and language changes contrary to evidence in therapy sessions that these areas were difficult for him. ? awareness vs. pt's personality.   Remaining deficits: Dysarthria incl. voice hoarseness, cognitive communication deficits, aphasia, dysphagia.   Education / Equipment: HEP for dysarthria/voice, swallow precautions, aspiration PNA s/sx, compensations for speech intelligibility  Plan: Patient agrees to discharge.  Patient goals were partially met. Patient is being discharged due to                                                     ?????maxing rehab potential at this time.      Problem List Patient Active Problem List   Diagnosis Date Noted  . Slow transit constipation   . Benign prostatic hyperplasia with urinary retention   . Right hemiparesis ([place])   . Chronic right shoulder pain   . Primary osteoarthritis of right shoulder   . Left basal ganglia embolic stroke ([place]) 94/70/9628  . CVA (cerebral vascular accident) ([place]) 08/31/2018  . Elevated aspartate aminotransferase level 06/16/2015  . H/O nutritional disorder 06/16/2015  . Excessive salivation 06/16/2015  . 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 Heart palpitations 06/16/2015  . 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 Screening for prostate cancer 08/21/2014  . Atypical chest pain 08/10/2013  . 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

## 2018-12-27 NOTE — Therapy (Signed)
[facility] [address] [place], [place], [address] Phone: [phone]   Fax:  [phone]  Physical Therapy Treatment  Patient Details  Name: Edward Glass MRN: [id number] Date of Birth: Nov 17, 1955 Referring Provider (PT): Alysia Penna MD   Encounter Date: 12/27/2018  PT End of Session - 12/27/18 1524    Visit Number  21    Number of Visits  27   21 visits to be completed (based on pt's request to save 8 visits for remainder of calendar yr)   Date for PT Re-Evaluation  02/03/19    Authorization Type  BCBS; met $7500 deductible; VL: PT/OT 30, seen on same day =1 visit; SLP 30 visits    Authorization Time Period  VL IS A HARD MAX    Authorization - Visit Number  22    Authorization - Number of Visits  30    PT Start Time  1102    PT Stop Time  1156    PT Time Calculation (min)  54 min    Activity Tolerance  Patient tolerated treatment well    Behavior During Therapy  [facility] for tasks assessed/performed       Past Medical History:  Diagnosis Date  . Atypical chest pain 08/10/2013  . Elevated aspartate aminotransferase level 06/16/2015  . Excessive salivation 06/16/2015  . H/O nutritional disorder 06/16/2015  . 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 Screening for prostate cancer 08/21/2014  . 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 HERNIA REPAIR  2006  . left tricep surgery    . LOOP RECORDER INSERTION N/A 09/04/2018   Procedure: LOOP RECORDER INSERTION;  Surgeon: Evans Lance, MD;  Location: [facility];  Service: Cardiovascular;  Laterality: N/A;  . TEE WITHOUT CARDIOVERSION N/A 09/04/2018   Procedure: TRANSESOPHAGEAL ECHOCARDIOGRAM (TEE);  Surgeon: Jerline Pain, MD;  Location: [facility] ENDOSCOPY;  Service: Cardiovascular;  Laterality: N/A;    There were no vitals filed for this visit.  Subjective Assessment - 12/27/18 1104     Subjective  The patient has been more stiff in right arm and leg this week.  His left elbow is sore too b/c "it takes the brunt of everything (family member reports)."    Patient Stated Goals  improve his walking and balance, be able to live on his own again in his 2 story house    Currently in Pain?  Yes    Pain Score  3     Pain Location  Shoulder    Pain Orientation  Right    Pain Descriptors / Indicators  --   stiff   Pain Type  Chronic pain    Pain Onset  More than a month ago    Pain Frequency  Intermittent    Aggravating Factors   *PT to monitor-- he has pool with OT today.         CLINIC OPERATION CHANGES: Outpatient [facility] Rehab is open at lower capacity following universal masking, social distancing, and patient screening.  The patient's COVID risk of complications score is 3.                [place] [facility] Adult PT Treatment/Exercise - 12/27/18 1525      Ambulation/Gait   Ambulation/Gait  Yes    Ambulation/Gait Assistance  4: Min guard;4: Min assist    Ambulation/Gait Assistance Details  WITH RW:  PT and patient worked on increased  ambulation speed with RW with verbal cues for increased transition from L heel strike to R swing phase of gait.  WITHOUT RW:  Ambulation without device iwht CGA to min A performing tactile cues for R knee extension at terminal stance, R hip initiation to move anteriorly to load R leg during mid stance phase of gait.     Ambulation Distance (Feet)  230 Feet   115 x 2   Assistive device  None;Rolling walker    Gait Pattern  Step-through pattern;Decreased stance time - right;Decreased hip/knee flexion - right;Decreased weight shift to right;Decreased arm swing - right;Decreased arm swing - left    Ambulation Surface  Level;Indoor    Stairs  Yes    Stairs Assistance  4: Min guard    Stairs Assistance Details (indicate cue type and reason)  Patient requests to do steps at end of treatment session as this is a goal for him.  He performed 8 steps with  CGA with tactile cues to lengthen through R side when stepping up  and to stay tall through hips when descending steps.  He flexes R knee and keeps R hip posterior to knee to maintain a flexion moment and reduce knee hyperextension.  We focused on feeling his hip/trunk stay tall over R leg during stair negotiation.    Stair Management Technique  One rail Left;Alternating pattern;Step to pattern;Forwards    Number of Stairs  8      Neuro Re-ed    Neuro Re-ed Details   TALL KNEELING:  performed heel sit<>tall kneeling x 5 reps without difficulty; moved L foot into abducted position and did mini hip abduction lifts while weight shifted to R side for hip co-contraction.  Then repeated with R leg extended laterally with hip abduction leg lifs with physical assist from PT.  Patient required min A to move standing<>tall knee with bench on mat.  STANDING in parallel bars with R UE assist performed R lateral weight shifting in stride position working on R knee flexion to mimic initial swing phase of gait, then performed same motion stepping down from rocker board with L foot (painful to R shoulder while holding parallel bar), lateral LE control on rocker board with CGA to min A.  Standing weight shifting emphasing R WS with ball roll under Lt foot with R knee tactile cuing for control by PT and CGA to min A.  Performed sit>stand with R leg posterior to L encouraging full extension when standing.                PT Short Term Goals - 12/07/18 1153      PT SHORT TERM GOAL #1   Title  =LTG        PT Long Term Goals - 12/07/18 1154      PT LONG TERM GOAL #1   Title  Patient will be independent with updated HEP and verbalize plan for continued community-based exercise plan.    Baseline  11/27/18: met with current HEP, will plan to progress/update in next session or 2.    Time  8    Period  Weeks    Status  On-going    Target Date  02/03/19      PT LONG TERM GOAL #2   Title  Patient will improve  gait velocity to 1.30 ft/sec demonstrating progress towards decr fall risk    Baseline  11/27/18: 1.06 ft/sec with RW with supervision, cues for gait mechanics; decr'd from assessment last week on  11/24/18    Time  8    Period  Weeks    Status  On-going    Target Date  02/03/19      PT LONG TERM GOAL #3   Title  Pt will amb. short distances (approx. 72') without device for household amb. with LUE support on environmental objects as needed for assist with balance.     Time  8    Period  Weeks    Status  New    Target Date  02/03/19      PT LONG TERM GOAL #4   Title  Patient will ambulate over outdoor paved surfaces modified independent with LRAD, including up/down curb.     Baseline  11/27/18: min guard to min assist on paved surfaces, did not assess curb     Time  8    Period  Weeks    Status  On-going    Target Date  02/03/19      PT LONG TERM GOAL #6   Title  Patient will go up/down 12 steps with single rail modified independent     Baseline  min guard/CGA needed for safety - 11-29-18; Lt hand rail used    Time  8    Period  Weeks    Status  On-going    Target Date  02/03/19            Plan - 12/27/18 1532    Clinical Impression Statement  PT emphasized breaking down gait sequence to improve overall functional mobility emphasizing R quad engagement and gluteal engagement for stronger mid stance positioning.  Patient is able to carryover at end of session.  Continue to LTGs.     PT Treatment/Interventions  ADLs/Self Care Home Management;Aquatic Therapy;Electrical Stimulation;DME Instruction;Gait training;Stair training;Functional mobility training;Therapeutic activities;Therapeutic exercise;Balance training;Orthotic Fit/Training;Patient/family education;Neuromuscular re-education;Wheelchair mobility training;Passive range of motion;Visual/perceptual remediation/compensation    PT Next Visit Plan  treadmill for gait training, stair negotiation training - step over step but stay close  to pt, gait without RW, power of [name] for quicker weight shifts and activation.    Consulted and Agree with Plan of Care  Patient;Family member/caregiver       Patient will benefit from skilled therapeutic intervention in order to improve the following deficits and impairments:  Abnormal gait, Decreased activity tolerance, Decreased balance, Decreased endurance, Decreased coordination, Decreased cognition, Decreased knowledge of use of DME, Decreased mobility, Decreased safety awareness, Difficulty walking, Decreased strength, Increased edema, Impaired perceived functional ability, Impaired UE functional use  Visit Diagnosis: Unsteadiness on feet  Other symptoms and signs involving the nervous system  Muscle weakness (generalized)  Other abnormalities of gait and mobility     Problem List Patient Active Problem List   Diagnosis Date Noted  . Slow transit constipation   . Benign prostatic hyperplasia with urinary retention   . Right hemiparesis ([place])   . Chronic right shoulder pain   . Primary osteoarthritis of right shoulder   . Left basal ganglia embolic stroke ([place]) 13/24/4010  . CVA (cerebral vascular accident) ([place]) 08/31/2018  . Elevated aspartate aminotransferase level 06/16/2015  . H/O nutritional disorder 06/16/2015  . Excessive salivation 06/16/2015  . 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 Heart palpitations 06/16/2015  . 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 Screening for prostate cancer 08/21/2014  . Atypical chest pain 08/10/2013  . 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    Mort Smelser , PT 12/27/2018, 3:36 PM  [facility] [address]  [place], [place], [address] Phone: [phone]   Fax:  [phone]  Name: Edward Glass MRN: [id number] Date of Birth: 10/06/1955

## 2018-12-27 NOTE — Therapy (Signed)
[facility] [address] [place], [place], [address] Phone: [phone]   Fax:  [phone]  Occupational Therapy Treatment  Patient Details  Name: Edward Glass MRN: [id number] Date of Birth: [birth year] Referring Provider (OT): Dr. Alysia Penna   Encounter Date: 12/27/2018  OT End of Session - 12/27/18 1429    Visit Number  21    Number of Visits  25    Date for OT Re-Evaluation  12/28/18    Authorization Type  [employer] - has 30 visits between OT and PT however if visits occur on same day counts as one visit. Pt wishes to use 23 visits and then save remaining visits for later in the year.    Authorization - Visit Number  22    Authorization - Number of Visits  25    OT Start Time  1301    OT Stop Time  1348    OT Time Calculation (min)  47 min    Activity Tolerance  Patient tolerated treatment well       Past Medical History:  Diagnosis Date  . Atypical chest pain 08/10/2013  . Elevated aspartate aminotransferase level 06/16/2015  . Excessive salivation 06/16/2015  . H/O nutritional disorder 06/16/2015  . 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 Screening for prostate cancer 08/21/2014  . 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 HERNIA REPAIR  2006  . left tricep surgery    . LOOP RECORDER INSERTION N/A 09/04/2018   Procedure: LOOP RECORDER INSERTION;  Surgeon: Evans Lance, MD;  Location: [facility];  Service: Cardiovascular;  Laterality: N/A;  . TEE WITHOUT CARDIOVERSION N/A 09/04/2018   Procedure: TRANSESOPHAGEAL ECHOCARDIOGRAM (TEE);  Surgeon: Jerline Pain, MD;  Location: [facility];  Service: Cardiovascular;  Laterality: N/A;    There were no vitals filed for this visit.  Subjective Assessment - 12/27/18 1426    Patient is accompanied by:  Family member   dtr   Pertinent History  L CVA on 09/14/18  Loop recorder, pt  taking blood thinner    Patient Stated Goals  walk better and use my hand    Currently in Pain?  Yes    Pain Score  4     Pain Location  Shoulder    Pain Orientation  Right    Pain Descriptors / Indicators  Sore;Aching    Pain Type  Chronic pain    Pain Onset  More than a month ago    Pain Frequency  Intermittent    Aggravating Factors   certain movements    Pain Relieving Factors  avoiding those movements        Treatment: Patient seen for aquatic therapy today.  Treatment took place in water 2.5-4 feet deep depending upon activity.  Pt entered the pool via steps using 1 hand rail and mod a for postural control, balance and [name] foot placement, mod cues. Attempted to use RUE however due to stiffness/pain and neglect was not functional.  This was pt's first time at aquatic therapy and pt stated "I am nervous in the water."  Briefly explained basic water principles to pt and daughter - will need to reinforce.  Utilized entering the pool via steps to begin to address postural alignment and control - pt with decreased ability to weight shift onto [name] as well as poor postural alignment when attempting weight shift.  Had pt stand with  [name] on bottom of pool and LLE behind on last step with 1 UE support - initially needed cues to bring R hip over knee and foot as well as to shift onto [name] however with practice this greatly improved. Pt needs max cues to relax LUE/head and upper trunk with any movement.  Addressed side stepping to the right with 1 UE support with emphasis on strengthening R hip ab/adduction using the resistance of the water as well as to address weight shifting onto [name] before side stepping with LLE. Pt needs min -mod facilitation and cues to weight shift, and fully extend R knee.  Pt with decreased passive and active  knee extension and flexion in R knee impacting functional mobility.  Utilized aqua stretch techniques to address both as well as ankle dorsiflexion with fair results  Will  continue to address.  Addressed active weight shifting onto [name] with good postural alignment with pt's back against pool way for stability and facilitation at BUE's for weight shift - pt able to progress to weight shift with min assist to single leg stance onto [name].  As pt participated in activities, [name] became much more active as evidenced by decreased floatation of the [name] ( as muscle activation improved).  Utilized floating device on unaffected LE to assist in allowing pt to more easily maintain midline as well as to increase active weight shift onto [name].  Utilized weight shifting and body on arm to begin to address R shoulder stiffness and pain as well.  Utilized backward walking with mod facilitation to address more active hip extension [name], weight shift to the R, and postural alignment and control. Also addressed gait speed to decrease fall risk by facilitation of functional ambulation in open water for short distances with BUE support by therapist and facilitation of more automatic movements.  Addressed knee flexion/extension ( 10 reps x2 ) of each - pt needs mod cues and min a due to weakness and apraxia - used water for resistance.  Pt exited the pool via steps using 1 hand rail, and mod a with cues.  By end of session pt reported he felt much more comfortable in the water and wanted to return.  On land, pt demonstrated improved automatic movements/improved gait speed and improved weight shifting and activation of [name].                       OT Short Term Goals - 12/27/18 1427      OT SHORT TERM GOAL #1   Title  Pt and family will be mod I  with RUE positioning/care to address edema and pain in RUE - 11/23/2018 (goal adjusted as pt missed one week)    Status  Achieved      OT SHORT TERM GOAL #2   Title  Pt and family will be mod I with splint wear and care    Status  Achieved      OT SHORT TERM GOAL #3   Title  Pt and dtr will be mod I with HEP for RUE for ROM, addressing pain,  functional use of RUE prn    Status  Achieved      OT SHORT TERM GOAL #4   Title  Pt will report pain in R hand/wrist no greater than 5/10 during HEP.      Status  Achieved      OT SHORT TERM GOAL #5   Title  Pt will demonstrate the ability for 55*  of shoulder flexion as prep for functional low reach    Status  On-going   45-50     OT SHORT TERM GOAL #6   Title  Pt will be mod I with toilet and tub bench transfers    Status  Achieved   met per pt report     OT SHORT TERM GOAL #7   Title  Pt will be mod I with bathing at shower level    Status  Achieved   met per pt report       OT Long Term Goals - 12/27/18 1427      OT LONG TERM GOAL #1   Title  Pt and family will be mod I with upgraded home activities program - 01/05/2019 (goal adjusted as pt missed one week)    Status  On-going      OT LONG TERM GOAL #2   Title  Pt will demonstrate ability to grasp and hold light weight object in R hand with pain 2/10 or less    Status  Achieved      OT LONG TERM GOAL #3   Title  Pt will demonstrate ability for bilateral low reach to obtain light weigh object     Status  On-going      OT LONG TERM GOAL #4   Title  Pt will be mod I with dressing    Status  Achieved      OT LONG TERM GOAL #5   Title  Pt will be mod I with cutting food, AE prn    Status  Achieved      OT LONG TERM GOAL #6   Title  Pt will demonstrate ability to use RUE as stabilizer at least 75% of the time in simple ADL tasks    Status  On-going      OT LONG TERM GOAL #7   Title  Pt will be mod I with simple snack/be prep at ambulatory level    Status  Achieved      OT LONG TERM GOAL #8   Title  Pt will demonstrate improved postural alignment to decrease pain in RUE as well as decrease risk of falls    Status  On-going            Plan - 12/27/18 1427    Clinical Impression Statement  Pt seen for aquatic therapy for first time today. Pt initially very anxious however stated "I really like it" at the  end of the session.     Occupational performance deficits (Please refer to evaluation for details):  ADL's;IADL's;Rest and Sleep;Work;Leisure;Social Participation    Body Structure / Function / Physical Skills  ADL;Balance;Decreased knowledge of use of DME;Coordination;Edema;GMC;FMC;Flexibility;IADL;Mobility;Pain;ROM;Strength;Tone;UE functional use;Endurance    Cognitive Skills  Safety Awareness    Rehab Potential  Good    Clinical Decision Making  Multiple treatment options, significant modification of task necessary    Comorbidities Affecting Occupational Performance:  Presence of comorbidities impacting occupational performance    Comorbidities impacting occupational performance description:  HTN, atypical chest pain, chronic R shoulder pain, OA of R shoulder, aphasia, apraxia, poor sensation    Modification or Assistance to Complete Evaluation   Max significant modification of tasks or assist is necessary to complete    OT Frequency  2x / week    OT Duration  12 weeks    OT Treatment/Interventions  Self-care/ADL training;Aquatic Therapy;Cryotherapy;Therapeutic exercise;Neuromuscular education;Energy conservation;DME and/or AE instruction;Functional Mobility Training;Cognitive remediation/compensation;Balance training;Splinting;Manual Therapy;Passive range of motion;Therapeutic activities;Patient/family education  Plan  aquatic therapy to address R shoulder pain prn, RUE reach and functional use, postural alignment, balance and functional mobility    Consulted and Agree with Plan of Care  Patient;Family member/caregiver    Family Member Consulted  daughter       Patient will benefit from skilled therapeutic intervention in order to improve the following deficits and impairments:  Body Structure / Function / Physical Skills, Cognitive Skills  Visit Diagnosis: Hemiplegia and hemiparesis following cerebral infarction affecting right dominant side (HCC)  Other symptoms and signs involving the  nervous system  Unsteadiness on feet  Muscle weakness (generalized)  Acute pain of right shoulder  Abnormal posture  Other symptoms and signs involving cognitive functions following cerebral infarction  Pain in right hand    Problem List Patient Active Problem List   Diagnosis Date Noted  . Slow transit constipation   . Benign prostatic hyperplasia with urinary retention   . Right hemiparesis ([place])   . Chronic right shoulder pain   . Primary osteoarthritis of right shoulder   . Left basal ganglia embolic stroke ([place]) 90/30/0923  . CVA (cerebral vascular accident) ([place]) 08/31/2018  . Elevated aspartate aminotransferase level 06/16/2015  . H/O nutritional disorder 06/16/2015  . Excessive salivation 06/16/2015  . 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 Heart palpitations 06/16/2015  . 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 Screening for prostate cancer 08/21/2014  . Atypical chest pain 08/10/2013  . 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 [place], [place], [address] Phone: [phone]   Fax:  [phone]  Name: RAYLEE ADAMEC MRN: [id number] Date of Birth: [birth year]

## 2019-01-03 ENCOUNTER — Encounter: Payer: Self-pay | Admitting: Physical Therapy

## 2019-01-03 ENCOUNTER — Encounter: Payer: Self-pay | Admitting: Occupational Therapy

## 2019-01-03 ENCOUNTER — Ambulatory Visit: Payer: BC Managed Care – PPO | Admitting: Occupational Therapy

## 2019-01-03 ENCOUNTER — Ambulatory Visit: Payer: BC Managed Care – PPO | Admitting: Physical Therapy

## 2019-01-03 ENCOUNTER — Other Ambulatory Visit: Payer: Self-pay

## 2019-01-03 DIAGNOSIS — I69351 Hemiplegia and hemiparesis following cerebral infarction affecting right dominant side: Secondary | ICD-10-CM

## 2019-01-03 DIAGNOSIS — R2689 Other abnormalities of gait and mobility: Secondary | ICD-10-CM

## 2019-01-03 DIAGNOSIS — R293 Abnormal posture: Secondary | ICD-10-CM

## 2019-01-03 DIAGNOSIS — R29818 Other symptoms and signs involving the nervous system: Secondary | ICD-10-CM

## 2019-01-03 DIAGNOSIS — M25511 Pain in right shoulder: Secondary | ICD-10-CM

## 2019-01-03 DIAGNOSIS — R2681 Unsteadiness on feet: Secondary | ICD-10-CM

## 2019-01-03 DIAGNOSIS — M6281 Muscle weakness (generalized): Secondary | ICD-10-CM

## 2019-01-03 DIAGNOSIS — I69318 Other symptoms and signs involving cognitive functions following cerebral infarction: Secondary | ICD-10-CM

## 2019-01-03 NOTE — Therapy (Signed)
[facility] [address] [place], [place], [address] Phone: [phone]   Fax:  [phone]  Occupational Therapy Treatment  Patient Details  Name: Edward Glass MRN: [id number] Date of Birth: [birth year] Referring Provider (OT): Dr. Alysia Penna   Encounter Date: 01/03/2019  OT End of Session - 01/03/19 1606    Visit Number  22    Number of Visits  25    Date for OT Re-Evaluation  12/28/18    Authorization Type  [employer] - has 30 visits between OT and PT however if visits occur on same day counts as one visit. Pt wishes to use 23 visits and then save remaining visits for later in the year.    Authorization - Visit Number  23    Authorization - Number of Visits  25    OT Start Time  6962    OT Stop Time  1350    OT Time Calculation (min)  47 min    Activity Tolerance  Patient tolerated treatment well       Past Medical History:  Diagnosis Date  . Atypical chest pain 08/10/2013  . Elevated aspartate aminotransferase level 06/16/2015  . Excessive salivation 06/16/2015  . H/O nutritional disorder 06/16/2015  . 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 Screening for prostate cancer 08/21/2014  . 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 HERNIA REPAIR  2006  . left tricep surgery    . LOOP RECORDER INSERTION N/A 09/04/2018   Procedure: LOOP RECORDER INSERTION;  Surgeon: Evans Lance, MD;  Location: [facility];  Service: Cardiovascular;  Laterality: N/A;  . TEE WITHOUT CARDIOVERSION N/A 09/04/2018   Procedure: TRANSESOPHAGEAL ECHOCARDIOGRAM (TEE);  Surgeon: Jerline Pain, MD;  Location: [facility] ENDOS[facility];  Service: Cardiovascular;  Laterality: N/A;    There were no vitals filed for this visit.  Subjective Assessment - 01/03/19 1600    Subjective   This make me tired but in a good way    Patient is accompanied by:  Family member   fiance Dena    Pertinent History  L CVA on 09/14/18  Loop recorder, pt taking blood thinner    Patient Stated Goals  walk better and use my hand    Currently in Pain?  Yes    Pain Score  4     Pain Location  Shoulder    Pain Orientation  Right    Pain Descriptors / Indicators  Sore    Pain Type  Chronic pain    Pain Onset  More than a month ago    Pain Frequency  Intermittent    Aggravating Factors   certain movements    Pain Relieving Factors  avoiding those movements         Treatment:  Patient seen for aquatic therapy today.  Treatment took place in water 2.5-4 feet deep depending upon activity.  Pt entered the pool via steps using 2 hand rails and min a with mod cues for sequencing and postural alignment.  Pt able to maintain RUE on hand rail today as he entered the pool due to improved postural alignment.  Pt much more comfortable in the water today.  Addressed postural alignment and control with emphasis on increase weight bearing and activation of [name] as well as R trunk and RUE with all activities. Pt needs cues and mod- min facilitation to fully extend R hip during weight bearing  on [name] with all activities.  Addressed active weight shifting from midline to R with emphasis on more active [name] and postural alignment initially with pt supported against pool wall. Able today to advance this to standing next to pool with only 1 UE support for weight shifting activities.  Addressed improving alignment through use of aqua stretch techniques for [name] hip extension as well as dorsiflexion.  Addressed use of forward and backward walking with 1 UE support on pool wall to facilitate isolated [name] hip extension, full weight shift onto R side and isolating movement at the LE without compensating with hip flexion and UE substitution.  Progressed to open water using dumb bell bar for BUE support and min facilitation for functional ambulation. Pt has very poor sensation and very poor awareness of body in space and midline  however resistance of water provides increased input to assist pt in determining and adjusting his COG over BOS. Transitioned into supine using head, hip and foot floatation devices. Utilized [place] techniques to mobilize R shoulder girdle as well as to improve dissociation for lower trunk on upper trunk then upper trunk on lower trunk movement.  Used this position to facilitate body on arm to allow for scapula ab/adduction and progressing to 90 * of horizontal abduction of RUE without pain.  Transitioned back onto standing and then addressed functional ambulation and dynamic standing balance in open water with hand held assist for short distance to stairs. Pt exited the stairs using 2 hand rails, mod cues and min assist.                     OT Short Term Goals - 12/27/18 1427      OT SHORT TERM GOAL #1   Title  Pt and family will be mod I  with RUE positioning/care to address edema and pain in RUE - 11/23/2018 (goal adjusted as pt missed one week)    Status  Achieved      OT SHORT TERM GOAL #2   Title  Pt and family will be mod I with splint wear and care    Status  Achieved      OT SHORT TERM GOAL #3   Title  Pt and dtr will be mod I with HEP for RUE for ROM, addressing pain, functional use of RUE prn    Status  Achieved      OT SHORT TERM GOAL #4   Title  Pt will report pain in R hand/wrist no greater than 5/10 during HEP.      Status  Achieved      OT SHORT TERM GOAL #5   Title  Pt will demonstrate the ability for 55* of shoulder flexion as prep for functional low reach    Status  On-going   45-50     OT SHORT TERM GOAL #6   Title  Pt will be mod I with toilet and tub bench transfers    Status  Achieved   met per pt report     OT SHORT TERM GOAL #7   Title  Pt will be mod I with bathing at shower level    Status  Achieved   met per pt report       OT Long Term Goals - 01/03/19 1603      OT LONG TERM GOAL #1   Title  Pt and family will be mod I with  upgraded home activities program -01/24/2019/2020 (goal adjusted as pt reduced  frequency to 1x/wk)    Status  On-going      OT LONG TERM GOAL #2   Title  Pt will demonstrate ability to grasp and hold light weight object in R hand with pain 2/10 or less    Status  Achieved      OT LONG TERM GOAL #3   Title  Pt will demonstrate ability for bilateral low reach to obtain light weigh object     Status  On-going      OT LONG TERM GOAL #4   Title  Pt will be mod I with dressing    Status  Achieved      OT LONG TERM GOAL #5   Title  Pt will be mod I with cutting food, AE prn    Status  Achieved      OT LONG TERM GOAL #6   Title  Pt will demonstrate ability to use RUE as stabilizer at least 75% of the time in simple ADL tasks    Status  On-going      OT LONG TERM GOAL #7   Title  Pt will be mod I with simple snack/be prep at ambulatory level    Status  Achieved      OT LONG TERM GOAL #8   Title  Pt will demonstrate improved postural alignment to decrease pain in RUE as well as decrease risk of falls    Status  On-going            Plan - 01/03/19 1601    Clinical Impression Statement  Pt slowly progressing toward goals. Pt much more comfortable today in the water.     Occupational performance deficits (Please refer to evaluation for details):  ADL's;IADL's;Rest and Sleep;Work;Leisure;Social Participation    Body Structure / Function / Physical Skills  ADL;Balance;Decreased knowledge of use of DME;Coordination;Edema;GMC;FMC;Flexibility;IADL;Mobility;Pain;ROM;Strength;Tone;UE functional use;Endurance    Cognitive Skills  Safety Awareness    Rehab Potential  Good    Clinical Decision Making  Multiple treatment options, significant modification of task necessary    Comorbidities Affecting Occupational Performance:  Presence of comorbidities impacting occupational performance    Comorbidities impacting occupational performance description:  HTN, atypical chest pain, chronic R shoulder  pain, OA of R shoulder, aphasia, apraxia, poor sensation    Modification or Assistance to Complete Evaluation   Max significant modification of tasks or assist is necessary to complete    OT Frequency  Other (comment)   2x/wk x1 week then 1x/wk x5 weeks from week of May 25/2020   OT Duration  6 weeks    OT Treatment/Interventions  Self-care/ADL training;Aquatic Therapy;Cryotherapy;Therapeutic exercise;Neuromuscular education;Energy conservation;DME and/or AE instruction;Functional Mobility Training;Cognitive remediation/compensation;Balance training;Splinting;Manual Therapy;Passive range of motion;Therapeutic activities;Patient/family education    Plan  aquatic therapy to address R shoulder pain prn, RUE reach and functional use, postural alignment, balance and functional mobility    Consulted and Agree with Plan of Care  Patient;Family member/caregiver    Family Member Consulted  fiance       Patient will benefit from skilled therapeutic intervention in order to improve the following deficits and impairments:  Body Structure / Function / Physical Skills, Cognitive Skills  Visit Diagnosis: Unsteadiness on feet - Plan: Ot plan of care cert/re-cert  Other symptoms and signs involving the nervous system - Plan: Ot plan of care cert/re-cert  Muscle weakness (generalized) - Plan: Ot plan of care cert/re-cert  Hemiplegia and hemiparesis following cerebral infarction affecting right dominant side ([place]) - Plan: Ot plan of  care cert/re-cert  Acute pain of right shoulder - Plan: Ot plan of care cert/re-cert  Abnormal posture - Plan: Ot plan of care cert/re-cert  Other symptoms and signs involving cognitive functions following cerebral infarction - Plan: Ot plan of care cert/re-cert    Problem List Patient Active Problem List   Diagnosis Date Noted  . Slow transit constipation   . Benign prostatic hyperplasia with urinary retention   . Right hemiparesis ([place])   . Chronic right shoulder pain    . Primary osteoarthritis of right shoulder   . Left basal ganglia embolic stroke ([place]) 93/73/7496  . CVA (cerebral vascular accident) ([place]) 08/31/2018  . Elevated aspartate aminotransferase level 06/16/2015  . H/O nutritional disorder 06/16/2015  . Excessive salivation 06/16/2015  . 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 Heart palpitations 06/16/2015  . 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 Screening for prostate cancer 08/21/2014  . Atypical chest pain 08/10/2013  . 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 [place], [place], [address] Phone: [phone]   Fax:  [phone]  Name: JOHNEL YIELDING MRN: [id number] Date of Birth: 1956/05/30

## 2019-01-03 NOTE — Therapy (Signed)
[facility] [address] [place], [place], [address] Phone: [phone]   Fax:  [phone]  Physical Therapy Treatment  Patient Details  Name: Edward Glass MRN: [id number] Date of Birth: Mar 16, 1956 Referring Provider (PT): Alysia Penna MD   Encounter Date: 01/03/2019   CLINIC OPERATION CHANGES: [facility] is open at lower capacity following universal masking, social distancing, and patient screening.  The patient's COVID risk of complications score is 3.   PT End of Session - 01/03/19 1216    Visit Number  22    Number of Visits  27   21 visits to be completed (based on pt's request to save 8 visits for remainder of calendar yr)   Date for PT Re-Evaluation  02/03/19    Authorization Type  BCBS; met $7500 deductible; VL: PT/OT 30, seen on same day =1 visit; SLP 30 visits    Authorization Time Period  VL IS A HARD MAX    Authorization - Visit Number  23    Authorization - Number of Visits  30    PT Start Time  1104    PT Stop Time  1144    PT Time Calculation (min)  40 min    Activity Tolerance  Patient tolerated treatment well    Behavior During Therapy  WFL for tasks assessed/performed       Past Medical History:  Diagnosis Date  . Atypical chest pain 08/10/2013  . Elevated aspartate aminotransferase level 06/16/2015  . Excessive salivation 06/16/2015  . H/O nutritional disorder 06/16/2015  . 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 Screening for prostate cancer 08/21/2014  . 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 HERNIA REPAIR  2006  . left tricep surgery    . LOOP RECORDER INSERTION N/A 09/04/2018   Procedure: LOOP RECORDER INSERTION;  Surgeon: Evans Lance, MD;  Location: [facility];  Service: Cardiovascular;  Laterality: N/A;  . TEE WITHOUT CARDIOVERSION N/A 09/04/2018   Procedure: TRANSESOPHAGEAL  ECHOCARDIOGRAM (TEE);  Surgeon: Jerline Pain, MD;  Location: [facility] ENDOSCOPY;  Service: Cardiovascular;  Laterality: N/A;    There were no vitals filed for this visit.  Subjective Assessment - 01/03/19 1107    Subjective  Going to aquatic therapy after PT today; still having some concerns with RUE function.    Pertinent History  chronic rt shoulder pain; had recent fall with rt wrist pain, xray negative for fracture and had steroid injection 09/27/18    Limitations  Walking    How long can you walk comfortably?  150 ft    Patient Stated Goals  improve his walking and balance, be able to live on his own again in his 2 story house    Currently in Pain?  Yes    Pain Score  2     Pain Location  Shoulder    Pain Orientation  Right    Pain Descriptors / Indicators  Sore    Pain Onset  More than a month ago                       [facility] Adult PT Treatment/Exercise - 01/03/19 1139      Ambulation/Gait   Ambulation/Gait  Yes    Ambulation/Gait Assistance  4: Min guard    Ambulation/Gait Assistance Details  without AD: performed 2 laps around gym with one therapist providing min A for  safety and other therapist using walking sticks (pt holding one end, therapist holding other) to facilitate UE and trunk reciprocal swing during gait; cues provided to increase gait velocity with increased stance on [name], increased step on LLE and to minimize use of R upper trap for swing and to momentum come from trunk rotation    Ambulation Distance (Feet)  230 Feet    Assistive device  None    Gait Pattern  Step-through pattern;Decreased stance time - right;Decreased hip/knee flexion - right;Decreased weight shift to right;Decreased arm swing - right;Decreased arm swing - left    Ambulation Surface  Level;Indoor    Stairs  Yes    Stairs Assistance  5: Supervision    Stairs Assistance Details (indicate cue type and reason)  mild catching of R toes on stairs when ascending with [name]    Stair Management  Technique  One rail Left;Alternating pattern;Forwards    Number of Stairs  8    Height of Stairs  6      Neuro Re-ed    Neuro Re-ed Details   modified plank with UE support on elevated mat focusing on closed chain RUE movements and WB.  Focused on forwards and backwards weight shifting for closed chain shoulder flexion<>extension with cues for scapular depression and protraction x 5 reps.  Transitioned to LUE and upper trunk rotation to L side with cues to push through RUE for closed chain scapular retraction x 3 reps.  Finished with modified mountain climbers 3 reps per side with bilat UE support with therapist providing support on R side and cues for increased activation on R side when lifting LLE      Exercises   Exercises  Lumbar      Lumbar Exercises: Seated   Other Seated Lumbar Exercises  Seated on mat performed alternating reaching UE across midline down outside of opposite LE to facilitate trunk rotation and flexion x 5 reps each side to prepare for gait with arm swing               PT Short Term Goals - 12/07/18 1153      PT SHORT TERM GOAL #1   Title  =LTG        PT Long Term Goals - 01/03/19 1223      PT LONG TERM GOAL #1   Title  Patient will be independent with updated HEP and verbalize plan for continued community-based exercise plan.    Baseline  11/27/18: met with current HEP, will plan to progress/update in next session or 2.    Time  8    Period  Weeks    Status  On-going      PT LONG TERM GOAL #2   Title  Patient will improve gait velocity to 1.30 ft/sec demonstrating progress towards decr fall risk    Baseline  11/27/18: 1.06 ft/sec with RW with supervision, cues for gait mechanics; decr'd from assessment last week on 11/24/18    Time  8    Period  Weeks    Status  On-going      PT LONG TERM GOAL #3   Title  Pt will amb. short distances (approx. 62') without device for household amb. with LUE support on environmental objects as needed for assist with  balance.     Time  8    Period  Weeks    Status  New      PT LONG TERM GOAL #4   Title  Patient will ambulate  over outdoor paved surfaces modified independent with LRAD, including up/down curb.     Baseline  11/27/18: min guard to min assist on paved surfaces, did not assess curb     Time  8    Period  Weeks    Status  On-going      PT LONG TERM GOAL #6   Title  Patient will go up/down 12 steps with single rail modified independent     Baseline  min guard/CGA needed for safety - 11-29-18; Lt hand rail used    Time  8    Period  Weeks    Status  On-going            Plan - 01/03/19 1217    Clinical Impression Statement  Continued to incorporate WB and activation of core and RUE into NMR exercises and gait.  Pt demonstrating improved sequencing and safety on stairs when performing alternating sequence with improved initiation of R swing phase and weight shifting when ascending and descending.  Will continue to address in order to progress towards LTG.    Rehab Potential  Good    PT Frequency  1x / week    PT Duration  3 weeks    PT Treatment/Interventions  ADLs/Self Care Home Management;Aquatic Therapy;Electrical Stimulation;DME Instruction;Gait training;Stair training;Functional mobility training;Therapeutic activities;Therapeutic exercise;Balance training;Orthotic Fit/Training;Patient/family education;Neuromuscular re-education;Wheelchair mobility training;Passive range of motion;Visual/perceptual remediation/compensation    PT Next Visit Plan  Check LTG; finalize HEP; Gait and balance with head turns    Consulted and Agree with Plan of Care  Patient;Family member/caregiver       Patient will benefit from skilled therapeutic intervention in order to improve the following deficits and impairments:  Abnormal gait, Decreased activity tolerance, Decreased balance, Decreased endurance, Decreased coordination, Decreased cognition, Decreased knowledge of use of DME, Decreased mobility,  Decreased safety awareness, Difficulty walking, Decreased strength, Increased edema, Impaired perceived functional ability, Impaired UE functional use  Visit Diagnosis: Other abnormalities of gait and mobility  Other symptoms and signs involving the nervous system  Hemiplegia and hemiparesis following cerebral infarction affecting right dominant side (HCC)  Unsteadiness on feet     Problem List Patient Active Problem List   Diagnosis Date Noted  . Slow transit constipation   . Benign prostatic hyperplasia with urinary retention   . Right hemiparesis ([place])   . Chronic right shoulder pain   . Primary osteoarthritis of right shoulder   . Left basal ganglia embolic stroke ([place]) 23/55/7322  . CVA (cerebral vascular accident) ([place]) 08/31/2018  . Elevated aspartate aminotransferase level 06/16/2015  . H/O nutritional disorder 06/16/2015  . Excessive salivation 06/16/2015  . 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 Heart palpitations 06/16/2015  . 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 Screening for prostate cancer 08/21/2014  . Atypical chest pain 08/10/2013  . 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 [place], [place], [address] Phone: [phone]   Fax:  [phone]  Name: FELDER LEBEDA MRN: [id number] Date of Birth: [birth year]

## 2019-01-10 ENCOUNTER — Other Ambulatory Visit: Payer: Self-pay

## 2019-01-10 ENCOUNTER — Encounter: Payer: Self-pay | Admitting: Occupational Therapy

## 2019-01-10 ENCOUNTER — Ambulatory Visit: Payer: BC Managed Care – PPO | Admitting: Physical Therapy

## 2019-01-10 ENCOUNTER — Ambulatory Visit: Payer: BC Managed Care – PPO | Admitting: Occupational Therapy

## 2019-01-10 DIAGNOSIS — I69351 Hemiplegia and hemiparesis following cerebral infarction affecting right dominant side: Secondary | ICD-10-CM | POA: Diagnosis not present

## 2019-01-10 DIAGNOSIS — R2689 Other abnormalities of gait and mobility: Secondary | ICD-10-CM

## 2019-01-10 DIAGNOSIS — R2681 Unsteadiness on feet: Secondary | ICD-10-CM

## 2019-01-10 DIAGNOSIS — R29818 Other symptoms and signs involving the nervous system: Secondary | ICD-10-CM

## 2019-01-10 DIAGNOSIS — M6281 Muscle weakness (generalized): Secondary | ICD-10-CM

## 2019-01-10 DIAGNOSIS — M25511 Pain in right shoulder: Secondary | ICD-10-CM

## 2019-01-10 DIAGNOSIS — R293 Abnormal posture: Secondary | ICD-10-CM

## 2019-01-10 DIAGNOSIS — I69318 Other symptoms and signs involving cognitive functions following cerebral infarction: Secondary | ICD-10-CM

## 2019-01-10 NOTE — Therapy (Signed)
[facility] [address] [place], [place], [address] Phone: [phone]   Fax:  [phone]  Physical Therapy Treatment  Patient Details  Name: Edward Glass MRN: [id number] Date of Birth: February 11, 1956 Referring Provider (PT): Alysia Penna MD   Encounter Date: 01/10/2019  PT End of Session - 01/10/19 1213    Visit Number  23    Number of Visits  27   21 visits to be completed (based on pt's request to save 8 visits for remainder of calendar yr)   Date for PT Re-Evaluation  02/03/19    Authorization Type  BCBS; met $7500 deductible; VL: PT/OT 30, seen on same day =1 visit; SLP 30 visits    Authorization Time Period  VL IS A HARD MAX    Authorization - Visit Number  24    Authorization - Number of Visits  30    PT Start Time  1105    PT Stop Time  1145    PT Time Calculation (min)  40 min    Activity Tolerance  Patient tolerated treatment well    Behavior During Therapy  [facility] for tasks assessed/performed       Past Medical History:  Diagnosis Date  . Atypical chest pain 08/10/2013  . Elevated aspartate aminotransferase level 06/16/2015  . Excessive salivation 06/16/2015  . H/O nutritional disorder 06/16/2015  . 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 Screening for prostate cancer 08/21/2014  . 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 HERNIA REPAIR  2006  . left tricep surgery    . LOOP RECORDER INSERTION N/A 09/04/2018   Procedure: LOOP RECORDER INSERTION;  Surgeon: Evans Lance, MD;  Location: [facility];  Service: Cardiovascular;  Laterality: N/A;  . TEE WITHOUT CARDIOVERSION N/A 09/04/2018   Procedure: TRANSESOPHAGEAL ECHOCARDIOGRAM (TEE);  Surgeon: Jerline Pain, MD;  Location: [facility] ENDOSCOPY;  Service: Cardiovascular;  Laterality: N/A;    There were no vitals filed for this visit.  Subjective Assessment - 01/10/19 1124     Subjective  Aquatic therapy is going well.  Walking at home by himself now without the RW and without touching walls or furniture.  Feeling very stiff today.    Pertinent History  chronic rt shoulder pain; had recent fall with rt wrist pain, xray negative for fracture and had steroid injection 09/27/18    Limitations  Walking    How long can you walk comfortably?  150 ft    Patient Stated Goals  improve his walking and balance, be able to live on his own again in his 2 story house    Currently in Pain?  No/denies    Pain Onset  More than a month ago                       [facility] Adult PT Treatment/Exercise - 01/10/19 1125      Exercises   Exercises  Shoulder      Lumbar Exercises: Aerobic   Nustep  Level 7 resistance x 6 minutes with bilat UE and LE for reciprocal UE and LE movement, ROM and activation.  While performing therapist provided facilitation at R scapula for depression, protraction and retraction      Lumbar Exercises: Supine   Large Ball Oblique Isometric  10 reps    Large Ball Oblique Isometric Limitations  bilat feet on ball and performing lower body rotations  to L and R    Other Supine Lumbar Exercises  With bilat feet on ball performed hamstring curls x 5, 10 reps R single leg hamstring curl, 10 reps bilat LE straight leg bridges with feet on ball, 5 reps straight leg bridge on ball with hamstring curl, 5 reps hamstring curl and maintain hamstring activation for stability while performing alternating hip flexion to lift one foot off of ball      Lumbar Exercises: Quadruped   Other Quadruped Lumbar Exercises  Modified quadruped with UE support on low table performing UE activation and weight shifting during alternating "mountain climbers" with 5-6 second hold.      Shoulder Exercises: Stretch   Wall Stretch - Flexion  2 reps;60 seconds    Wall Stretch - Flexion Limitations  R pec major/minor stretch with RUE against wall, body turning to L              PT Education - 01/10/19 1213    Education Details  added standing R pec stretch stretch to HEP    Person(s) Educated  Patient    Methods  Explanation;Demonstration;Handout    Comprehension  Verbalized understanding;Returned demonstration       PT Short Term Goals - 12/07/18 1153      PT SHORT TERM GOAL #1   Title  =LTG        PT Long Term Goals - 01/03/19 1223      PT LONG TERM GOAL #1   Title  Patient will be independent with updated HEP and verbalize plan for continued community-based exercise plan.    Baseline  11/27/18: met with current HEP, will plan to progress/update in next session or 2.    Time  8    Period  Weeks    Status  On-going      PT LONG TERM GOAL #2   Title  Patient will improve gait velocity to 1.30 ft/sec demonstrating progress towards decr fall risk    Baseline  11/27/18: 1.06 ft/sec with RW with supervision, cues for gait mechanics; decr'd from assessment last week on 11/24/18    Time  8    Period  Weeks    Status  On-going      PT LONG TERM GOAL #3   Title  Pt will amb. short distances (approx. 57') without device for household amb. with LUE support on environmental objects as needed for assist with balance.     Time  8    Period  Weeks    Status  New      PT LONG TERM GOAL #4   Title  Patient will ambulate over outdoor paved surfaces modified independent with LRAD, including up/down curb.     Baseline  11/27/18: min guard to min assist on paved surfaces, did not assess curb     Time  8    Period  Weeks    Status  On-going      PT LONG TERM GOAL #6   Title  Patient will go up/down 12 steps with single rail modified independent     Baseline  min guard/CGA needed for safety - 11-29-18; Lt hand rail used    Time  8    Period  Weeks    Status  On-going            Plan - 01/10/19 1214    Clinical Impression Statement  Treatment session focused on use of dynamic and static stretches to increase R shoulder and RUE AROM.  Utilized  physioball in supine to continue to focus on NMR and activation of core and R hip flexors and hamstring muscle groups.  Returned to modified quadruped/plank position with UE support on table to continue NMR in forced WB position - pt better able to tolerate WB through R side today with increased activation noted during R stance.  Pt tolerated well; will assess LTG and finalize HEP at next session.    Rehab Potential  Good    PT Frequency  1x / week    PT Duration  3 weeks    PT Treatment/Interventions  ADLs/Self Care Home Management;Aquatic Therapy;Electrical Stimulation;DME Instruction;Gait training;Stair training;Functional mobility training;Therapeutic activities;Therapeutic exercise;Balance training;Orthotic Fit/Training;Patient/family education;Neuromuscular re-education;Wheelchair mobility training;Passive range of motion;Visual/perceptual remediation/compensation    PT Next Visit Plan  Check LTG; finalize HEP; Gait and balance with head turns    Consulted and Agree with Plan of Care  Patient;Family member/caregiver       Patient will benefit from skilled therapeutic intervention in order to improve the following deficits and impairments:  Abnormal gait, Decreased activity tolerance, Decreased balance, Decreased endurance, Decreased coordination, Decreased cognition, Decreased knowledge of use of DME, Decreased mobility, Decreased safety awareness, Difficulty walking, Decreased strength, Increased edema, Impaired perceived functional ability, Impaired UE functional use  Visit Diagnosis: 1. Other abnormalities of gait and mobility   2. Other symptoms and signs involving the nervous system   3. Hemiplegia and hemiparesis following cerebral infarction affecting right dominant side ([place])   4. Unsteadiness on feet        Problem List Patient Active Problem List   Diagnosis Date Noted  . Slow transit constipation   . Benign prostatic hyperplasia with urinary retention   . Right hemiparesis  ([place])   . Chronic right shoulder pain   . Primary osteoarthritis of right shoulder   . Left basal ganglia embolic stroke ([place]) 57/26/2035  . CVA (cerebral vascular accident) ([place]) 08/31/2018  . Elevated aspartate aminotransferase level 06/16/2015  . H/O nutritional disorder 06/16/2015  . Excessive salivation 06/16/2015  . 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 Heart palpitations 06/16/2015  . 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 Screening for prostate cancer 08/21/2014  . Atypical chest pain 08/10/2013  . 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 [place], [place], [address] Phone: [phone]   Fax:  [phone]  Name: JUDD MCCUBBIN MRN: [id number] Date of Birth: 1956/01/05

## 2019-01-10 NOTE — Therapy (Signed)
[facility] [address] [place], [place], [address] Phone: [phone]   Fax:  [phone]  Occupational Therapy Treatment  Patient Details  Name: Edward Glass MRN: [id number] Date of Birth: June 17, 1956 Referring Provider (OT): Dr. Alysia Penna   Encounter Date: 01/10/2019  OT End of Session - 01/10/19 2124    Visit Number  23    Number of Visits  25    Date for OT Re-Evaluation  12/28/18    Authorization Type  [employer] - has 30 visits between OT and PT however if visits occur on same day counts as one visit. Pt wishes to use 23 visits and then save remaining visits for later in the year.    Authorization - Visit Number  24    Authorization - Number of Visits  25    OT Start Time  9735    OT Stop Time  1349    OT Time Calculation (min)  51 min    Activity Tolerance  Patient tolerated treatment well       Past Medical History:  Diagnosis Date  . Atypical chest pain 08/10/2013  . Elevated aspartate aminotransferase level 06/16/2015  . Excessive salivation 06/16/2015  . H/O nutritional disorder 06/16/2015  . 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 Screening for prostate cancer 08/21/2014  . 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 HERNIA REPAIR  2006  . left tricep surgery    . LOOP RECORDER INSERTION N/A 09/04/2018   Procedure: LOOP RECORDER INSERTION;  Surgeon: Evans Lance, MD;  Location: [facility];  Service: Cardiovascular;  Laterality: N/A;  . TEE WITHOUT CARDIOVERSION N/A 09/04/2018   Procedure: TRANSESOPHAGEAL ECHOCARDIOGRAM (TEE);  Surgeon: Jerline Pain, MD;  Location: [facility] ENDOS[facility];  Service: Cardiovascular;  Laterality: N/A;    There were no vitals filed for this visit.  Subjective Assessment - 01/10/19 2120    Subjective   I can sometimes tell when the right side is working    Patient is accompanied by:  Family member    daughter   Pertinent History  L CVA on 09/14/18  Loop recorder, pt taking blood thinner    Patient Stated Goals  walk better and use my hand    Currently in Pain?  Yes    Pain Score  4     Pain Location  Shoulder    Pain Orientation  Right    Pain Descriptors / Indicators  Tightness;Sore    Pain Type  Chronic pain    Pain Onset  More than a month ago    Pain Frequency  Intermittent    Aggravating Factors   certain movements    Pain Relieving Factors  avoiding those movements       Patient seen for aquatic therapy today.  Treatment took place in water 2.5-4 feet deep depending upon activity.  Pt entered the pool via steps and using 2 hand rails with min facilitation and cues for postural alignment and control/balance. Pt needs max cues to decrease reliance on LUE in order to allow RUE, [name] and trunk to be more active.  Initially addressed increasing activity of R side as prep for weight shifting, functional ambulation and RUE mid reach activities.  Addressed lateral weight shifting to [name] with back supported by pool wall initially and facilitating pt coming fully onto [name] with aligned trunk and allowing LLE to float in water.  Initially  pt needed mod facilitation however with practice pt able to do with cues and very little assistance.  Progressed to weight shifting using only 1 UE support moving from step to stance over [name] and then progressing to walking backward and forward with 1 UE support on pool edge and mod to min facilitation for [name] and for weight shift with alignment.  Focus was also on increasing speed and automaticity of movements.  Pt with improved ability today for this activity.  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  Transitioned pt to supine using floatation devices to utilize [employer] techniques and body on arm movement in order to mobilize R scapula for ab/adduction as well as elevation and  depression. Once shoulder girdle more aligned addressed active ab/adduction of the shoulder in supine - with active assist pt eventually able to tolerate 85* of abduction.  Transitioned back into standing and pt positioned with back against pool wall for support.  Utilized light dumb bell in RUE only to elicit mid reach patterns of shoulder flexion directly forward and then to angle of 45* away from body, eventually incorporating full body weight shift onto [name] with RUE reaching (supported by light dumb bell) with mod facilitation. Pt is able to initiate all movements however initiation is inconsistent and pt needs assist to move all the way through the arc of movement.  Pt exited the pool via steps and 2 hand rails with moderate assistance to power up with LE's and align body over BOS vs pulling with UE's.  Daughter instructed in how to cue pt on land for carry over - daughter able to return demonstrate.                       OT Short Term Goals - 01/10/19 2121      OT SHORT TERM GOAL #1   Title  Pt and family will be mod I  with RUE positioning/care to address edema and pain in RUE - 11/23/2018 (goal adjusted as pt missed one week)    Status  Achieved      OT SHORT TERM GOAL #2   Title  Pt and family will be mod I with splint wear and care    Status  Achieved      OT SHORT TERM GOAL #3   Title  Pt and dtr will be mod I with HEP for RUE for ROM, addressing pain, functional use of RUE prn    Status  Achieved      OT SHORT TERM GOAL #4   Title  Pt will report pain in R hand/wrist no greater than 5/10 during HEP.      Status  Achieved      OT SHORT TERM GOAL #5   Title  Pt will demonstrate the ability for 55* of shoulder flexion as prep for functional low reach    Status  On-going   45-50     OT SHORT TERM GOAL #6   Title  Pt will be mod I with toilet and tub bench transfers    Status  Achieved   met per pt report     OT SHORT TERM GOAL #7   Title  Pt will be mod I  with bathing at shower level    Status  Achieved   met per pt report       OT Long Term Goals - 01/10/19 2122  OT LONG TERM GOAL #1   Title  Pt and family will be mod I with upgraded home activities program -01/24/2019/2020 (goal adjusted as pt reduced frequency to 1x/wk)    Status  On-going      OT LONG TERM GOAL #2   Title  Pt will demonstrate ability to grasp and hold light weight object in R hand with pain 2/10 or less    Status  Achieved      OT LONG TERM GOAL #3   Title  Pt will demonstrate ability for bilateral low reach to obtain light weigh object     Status  On-going      OT LONG TERM GOAL #4   Title  Pt will be mod I with dressing    Status  Achieved      OT LONG TERM GOAL #5   Title  Pt will be mod I with cutting food, AE prn    Status  Achieved      OT LONG TERM GOAL #6   Title  Pt will demonstrate ability to use RUE as stabilizer at least 75% of the time in simple ADL tasks    Status  On-going      OT LONG TERM GOAL #7   Title  Pt will be mod I with simple snack/be prep at ambulatory level    Status  Achieved      OT LONG TERM GOAL #8   Title  Pt will demonstrate improved postural alignment to decrease pain in RUE as well as decrease risk of falls    Status  On-going            Plan - 01/10/19 2122    Clinical Impression Statement  Pt with slow progress toward goals. Pt improving with alignment and mobility in the water- family asked to reinforce when pt is on land.    Occupational performance deficits (Please refer to evaluation for details):  ADL's;IADL's;Rest and Sleep;Work;Leisure;Social Participation    Body Structure / Function / Physical Skills  ADL;Balance;Decreased knowledge of use of DME;Coordination;Edema;GMC;FMC;Flexibility;IADL;Mobility;Pain;ROM;Strength;Tone;UE functional use;Endurance    Cognitive Skills  Safety Awareness    Rehab Potential  Good    Clinical Decision Making  Multiple treatment options, significant modification of  task necessary    Comorbidities impacting occupational performance description:  HTN, atypical chest pain, chronic R shoulder pain, OA of R shoulder, aphasia, apraxia, poor sensation    Modification or Assistance to Complete Evaluation   Max significant modification of tasks or assist is necessary to complete    OT Frequency  Other (comment)   2x/wk x1 week then 1x/wk x4 weeks   OT Duration  6 weeks    OT Treatment/Interventions  Self-care/ADL training;Aquatic Therapy;Cryotherapy;Therapeutic exercise;Neuromuscular education;Energy conservation;DME and/or AE instruction;Functional Mobility Training;Cognitive remediation/compensation;Balance training;Splinting;Manual Therapy;Passive range of motion;Therapeutic activities;Patient/family education    Plan  aquatic therapy to address R shoulder pain prn, RUE reach and functional use, postural alignment, balance and functional mobility    Consulted and Agree with Plan of Care  Patient;Family member/caregiver    Family Member Consulted  daughter       Patient will benefit from skilled therapeutic intervention in order to improve the following deficits and impairments:   Body Structure / Function / Physical Skills: ADL, Balance, Decreased knowledge of use of DME, Coordination, Edema, GMC, FMC, Flexibility, IADL, Mobility, Pain, ROM, Strength, Tone, UE functional use, Endurance Cognitive Skills: Safety Awareness     Visit Diagnosis: 1. Other symptoms and signs involving the  nervous system   2. Hemiplegia and hemiparesis following cerebral infarction affecting right dominant side ([place])   3. Unsteadiness on feet   4. Muscle weakness (generalized)   5. Acute pain of right shoulder   6. Abnormal posture   7. Other symptoms and signs involving cognitive functions following cerebral infarction       Problem List Patient Active Problem List   Diagnosis Date Noted  . Slow transit constipation   . Benign prostatic hyperplasia with urinary retention    . Right hemiparesis ([place])   . Chronic right shoulder pain   . Primary osteoarthritis of right shoulder   . Left basal ganglia embolic stroke ([place]) 21/58/7276  . CVA (cerebral vascular accident) ([place]) 08/31/2018  . Elevated aspartate aminotransferase level 06/16/2015  . H/O nutritional disorder 06/16/2015  . Excessive salivation 06/16/2015  . 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 Heart palpitations 06/16/2015  . 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 Screening for prostate cancer 08/21/2014  . Atypical chest pain 08/10/2013  . 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

## 2019-01-10 NOTE — Patient Instructions (Signed)
External Rotator Cuff Stretch, Standing    Stand, RIGHT palm against door frame and elbow bent at 90. Turn body away from fixed hand (to LEFT) allowing shoulder to come forward. Hold _60__ seconds.  Repeat __2_ times per session. Do _2__ sessions per day.  Copyright  VHI. All rights reserved.

## 2019-01-15 ENCOUNTER — Ambulatory Visit (INDEPENDENT_AMBULATORY_CARE_PROVIDER_SITE_OTHER): Payer: BC Managed Care – PPO | Admitting: *Deleted

## 2019-01-15 DIAGNOSIS — I63512 Cerebral infarction due to unspecified occlusion or stenosis of left middle cerebral artery: Secondary | ICD-10-CM

## 2019-01-15 LAB — CUP PACEART REMOTE DEVICE CHECK
Date Time Interrogation Session: 20200621233856
Implantable Pulse Generator Implant Date: 20200210

## 2019-01-16 ENCOUNTER — Encounter: Payer: Self-pay | Admitting: Physical Medicine & Rehabilitation

## 2019-01-16 ENCOUNTER — Other Ambulatory Visit: Payer: Self-pay

## 2019-01-16 ENCOUNTER — Encounter: Payer: BC Managed Care – PPO | Attending: Registered Nurse | Admitting: Physical Medicine & Rehabilitation

## 2019-01-16 VITALS — BP 132/84 | HR 62 | Temp 98.3°F | Ht 65.0 in | Wt 160.0 lb

## 2019-01-16 DIAGNOSIS — I639 Cerebral infarction, unspecified: Secondary | ICD-10-CM | POA: Insufficient documentation

## 2019-01-16 DIAGNOSIS — G8191 Hemiplegia, unspecified affecting right dominant side: Secondary | ICD-10-CM

## 2019-01-16 DIAGNOSIS — E785 Hyperlipidemia, unspecified: Secondary | ICD-10-CM | POA: Insufficient documentation

## 2019-01-16 DIAGNOSIS — M7501 Adhesive capsulitis of right shoulder: Secondary | ICD-10-CM | POA: Diagnosis not present

## 2019-01-16 MED ORDER — TIZANIDINE HCL 4 MG PO TABS: 4.0000 mg | ORAL_TABLET | Freq: Every day | ORAL | 1 refills | Status: DC

## 2019-01-16 NOTE — Progress Notes (Signed)
Subjective:    Patient ID: Edward Glass, male    DOB: [birth year], 63 y.o.   MRN: [id number]  HPI Right shoulder injection is starting to wear off last performed 12/07/18 prior injection was 10/2018 The pain is only with movement. No hand pain. No wrist pain  Ambulates modified independent without a cane in the home uses a cane outside the home. Speech is louder, patient more verbose  Has finished up with outpatient therapy has been in aquatic therapy as well  Additional complaint of waking up with his right knee slightly bent and difficult to straighten in the mornings he feels like there is a spasm in his hamstring area Pain Inventory Average Pain 5 Pain Right Now 5 My pain is aching  In the last 24 hours, has pain interfered with the following? General activity 3 Relation with others 3 Enjoyment of life 3 What TIME of day is your pain at its worst? morning Sleep (in general) Fair  Pain is worse with: bending Pain improves with: injections Relief from Meds: .  Mobility walk with assistance use a cane  Function Do you have any goals in this area?  yes  Neuro/Psych bladder control problems  Prior Studies Any changes since last visit?  no  Physicians involved in your care Any changes since last visit?  no   Family History  Problem Relation Age of Onset  . Kidney failure Mother   . Cancer Mother   . Prostate cancer Father   . Cancer Father   . Diabetes Father   . Colon cancer Neg Hx   . Esophageal cancer Neg Hx   . Rectal cancer Neg Hx   . Stomach cancer Neg Hx    Social History   Socioeconomic History  . Marital status: Married    Spouse name: Not on file  . Number of children: Not on file  . Years of education: Not on file  . Highest education level: Not on file  Occupational History  . Not on file  Social Needs  . Financial resource strain: Not hard at all  . Food insecurity    Worry: Never true    Inability: Never true  . Transportation  needs    Medical: No    Non-medical: No  Tobacco Use  . Smoking status: Never Smoker  . Smokeless tobacco: Never Used  Substance and Sexual Activity  . Alcohol use: No  . Drug use: No  . Sexual activity: Not on file  Lifestyle  . Physical activity    Days per week: 6 days    Minutes per session: 30 min  . Stress: Not at all  Relationships  . Social Herbalist on phone: Three times a week    Gets together: Three times a week    Attends religious service: Not on file    Active member of club or organization: Yes    Attends meetings of clubs or organizations: 1 to 4 times per year    Relationship status: Not on file  Other Topics Concern  . Not on file  Social History Narrative  . Not on file   Past Surgical History:  Procedure Laterality Date  . HERNIA REPAIR  2006  . left tricep surgery    . LOOP RECORDER INSERTION N/A 09/04/2018   Procedure: LOOP RECORDER INSERTION;  Surgeon: Evans Lance, MD;  Location: [facility];  Service: Cardiovascular;  Laterality: N/A;  . TEE WITHOUT CARDIOVERSION N/A  09/04/2018   Procedure: TRANSESOPHAGEAL ECHOCARDIOGRAM (TEE);  Surgeon: Jerline Pain, MD;  Location: [facility] ENDOSCOPY;  Service: Cardiovascular;  Laterality: N/A;   Past Medical History:  Diagnosis Date  . Atypical chest pain 08/10/2013  . Elevated aspartate aminotransferase level 06/16/2015  . Excessive salivation 06/16/2015  . H/O nutritional disorder 06/16/2015  . 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 Screening for prostate cancer 08/21/2014  . 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   BP 132/84   Pulse 62   Temp 98.3 F (36.8 C)   Ht 5\' 5"  (1.651 m)   Wt 160 lb (72.6 kg)   SpO2 98%   BMI 26.63 kg/m   Opioid Risk Score:   Fall Risk Score:  `1  Depression screen PHQ 2/9  Depression screen [facility] 2/9 11/08/2018 10/10/2018  Decreased Interest 0 1  Down, Depressed, Hopeless 1 2  PHQ - 2 Score 1 3  Altered sleeping - 0  Tired, decreased  energy - 0  Change in appetite - 0  Feeling bad or failure about yourself  - 1  Trouble concentrating - 0  Moving slowly or fidgety/restless - 0  Suicidal thoughts - 0  PHQ-9 Score - 4  Difficult doing work/chores - Not difficult at all     Review of Systems  Constitutional: Negative.   HENT: Negative.   Eyes: Negative.   Respiratory: Negative.   Cardiovascular: Negative.   Gastrointestinal: Negative.   Endocrine: Negative.   Genitourinary: Positive for difficulty urinating.  Musculoskeletal: Positive for arthralgias.  Skin: Negative.   Allergic/Immunologic: Negative.   Neurological: Negative.   Hematological: Negative.   Psychiatric/Behavioral: Negative.   All other systems reviewed and are negative.      Objective:   Physical Exam Vitals signs and nursing note reviewed.  Constitutional:      Appearance: Normal appearance. He is normal weight.  HENT:     Head: Normocephalic and atraumatic.     Nose: Nose normal.  Neurological:     Mental Status: He is alert and oriented to person, place, and time.  Psychiatric:        Mood and Affect: Mood normal.        Behavior: Behavior normal.    Motor strength is 4/5 in the right finger flexors and extensors wrist flexors and extensors 3- at the elbow flexor and 2- elbow extensor, 3- shoulder abduction Right shoulder with diminished external rotation there is increased tone at the pectoralis primarily in the clavicular head. There is no pain or swelling in the hand or wrist area. Biceps tone is MAS 3 mainly at end range. Right pronator tone MAS 3  Patient has stiff legged gait with cocontraction of the hamstring and quadriceps during swing and stance phase.    Assessment & Plan:  1.  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 Would recommend tizanidine to be used at night.  If no improvement with this would inject with 100 units of Botox into the hamstring Right shoulder tightness see below  Botox  Right  Pec 100 Bicep 50 Pronator 50  Hamstring 100U if does not respond to tizanidine  And agrees with treatment plan

## 2019-01-17 ENCOUNTER — Ambulatory Visit: Payer: BC Managed Care – PPO | Admitting: Physical Therapy

## 2019-01-17 ENCOUNTER — Encounter: Payer: Self-pay | Admitting: Occupational Therapy

## 2019-01-17 ENCOUNTER — Ambulatory Visit: Payer: BC Managed Care – PPO | Admitting: Occupational Therapy

## 2019-01-17 DIAGNOSIS — R2689 Other abnormalities of gait and mobility: Secondary | ICD-10-CM

## 2019-01-17 DIAGNOSIS — I69318 Other symptoms and signs involving cognitive functions following cerebral infarction: Secondary | ICD-10-CM

## 2019-01-17 DIAGNOSIS — M6281 Muscle weakness (generalized): Secondary | ICD-10-CM

## 2019-01-17 DIAGNOSIS — M25511 Pain in right shoulder: Secondary | ICD-10-CM

## 2019-01-17 DIAGNOSIS — I69351 Hemiplegia and hemiparesis following cerebral infarction affecting right dominant side: Secondary | ICD-10-CM

## 2019-01-17 DIAGNOSIS — R2681 Unsteadiness on feet: Secondary | ICD-10-CM

## 2019-01-17 DIAGNOSIS — R29818 Other symptoms and signs involving the nervous system: Secondary | ICD-10-CM

## 2019-01-17 DIAGNOSIS — R293 Abnormal posture: Secondary | ICD-10-CM

## 2019-01-17 DIAGNOSIS — G8929 Other chronic pain: Secondary | ICD-10-CM

## 2019-01-17 NOTE — Therapy (Signed)
[facility] [address] [place], [place], [address] Phone: [phone]   Fax:  [phone]  Physical Therapy Treatment - D/C Summary  Patient Details  Name: Edward Glass MRN: [id number] Date of Birth: 1955-10-23 Referring Provider (PT): Alysia Penna MD  CLINIC OPERATION CHANGES: [facility] is open at lower capacity following universal masking, social distancing, and patient screening.  The patient's COVID risk of complications score is 3.    Encounter Date: 01/17/2019  PT End of Session - 01/17/19 1210    Visit Number  24    Number of Visits  27   21 visits to be completed (based on pt's request to save 8 visits for remainder of calendar yr)   Date for PT Re-Evaluation  02/03/19    Authorization Type  BCBS; met $7500 deductible; VL: PT/OT 30, seen on same day =1 visit; SLP 30 visits    Authorization Time Period  VL IS A HARD MAX    Authorization - Visit Number  25    Authorization - Number of Visits  30    PT Start Time  1101    PT Stop Time  1148    PT Time Calculation (min)  47 min    Equipment Utilized During Treatment  Gait belt    Activity Tolerance  Patient tolerated treatment well    Behavior During Therapy  WFL for tasks assessed/performed       Past Medical History:  Diagnosis Date  . Atypical chest pain 08/10/2013  . Elevated aspartate aminotransferase level 06/16/2015  . Excessive salivation 06/16/2015  . H/O nutritional disorder 06/16/2015  . 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 Screening for prostate cancer 08/21/2014  . 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 HERNIA REPAIR  2006  . left tricep surgery    . LOOP RECORDER INSERTION N/A 09/04/2018   Procedure: LOOP RECORDER INSERTION;  Surgeon: Evans Lance, MD;  Location: [facility];  Service: Cardiovascular;  Laterality: N/A;  . TEE WITHOUT  CARDIOVERSION N/A 09/04/2018   Procedure: TRANSESOPHAGEAL ECHOCARDIOGRAM (TEE);  Surgeon: Jerline Pain, MD;  Location: [facility];  Service: Cardiovascular;  Laterality: N/A;    There were no vitals filed for this visit.  Subjective Assessment - 01/17/19 1106    Subjective  Feels as if he has gotten better with therapy.    Pertinent History  chronic rt shoulder pain; had recent fall with rt wrist pain, xray negative for fracture and had steroid injection 09/27/18    Limitations  Walking    How long can you walk comfortably?  150 ft    Patient Stated Goals  improve his walking and balance, be able to live on his own again in his 2 story house    Pain Onset  More than a month ago         Access Code: [email or web address]  URL: [email or web address]  Date: 01/17/2019  Prepared by: Misty Stanley   Finalized and added exercises to HEP handout to address trunk AROM and BLE strengthening.   Exercises Supine Lower Trunk Rotation - 10 reps - 1 sets - 1x daily - 5x weekly Supine Bridge - 10 reps - 1 sets - 5 seconds hold - 1x daily - 5x weekly Sit to Stand without Arm Support - 10 reps - 1-2 sets - 1x daily - 5x weekly  :  --with [name] staggered slightly behind L  in order for more weight bearing through [name] Side Stepping with Unilateral Counter Support - 10 reps - 2 sets - 1x daily - 7x weekly  --Verbal cueing for technique: weight shifting and lifting [name] to take a step.  Backward Walking with Counter Support - 10 reps - 2 sets - 1x daily - 7x weekly Seated Knee Flexion Extension AROM - 10 reps - 1 sets - 1x daily - 7x weekly  --Using pillow case, verbal cueing to dig heel into floor in order to activate hamstrings. Tried to attempt R hamstring curls in prone, however patient unable to achieve position due to shoulder discomfort; patient tried x1 rep but was unable to activate R hamstrings in position against gravity.                [facility] Adult PT Treatment/Exercise - 01/17/19  1220      Bed Mobility   Bed Mobility  --   Prone > L sidelying req mod A and cues for sequencing     Ambulation/Gait   Ambulation/Gait  Yes    Ambulation/Gait Assistance  5: Supervision;6: Modified independent (Device/Increase time);4: Min guard    Ambulation/Gait Assistance Details  Patient ambulated 20' without AD with supervision/CGA provided from therapist over level surfaces. Patient ambulated 30' over paved surfaces outside using large base quad cane and supervision. Cueing for heel toe pattern.      Assistive device  Large base quad cane    Gait Pattern  Step-through pattern;Step-to pattern;Decreased step length - left;Decreased stance time - right;Decreased dorsiflexion - right;Decreased weight shift to right;Decreased trunk rotation    Ambulation Surface  Level;Outdoor;Paved    Gait velocity  40.37 seconds = .81 ft/sec, using RW over level surface     Stairs  Yes    Stairs Assistance  6: Modified independent (Device/Increase time);5: Supervision    Stairs Assistance Details (indicate cue type and reason)  Mod I for ascending, supervision descending. Patient able to verbalize when he performed incorrect sequencing x1 time.     Stair Management Technique  One rail Left;Alternating pattern;Forwards    Number of Stairs  12    Height of Stairs  6    Curb  5: Supervision    Curb Details (indicate cue type and reason)  Patient requiring supervision and verbal cueing for correct sequencing to ascend/descend curb with large base quad cane. Patient takes increased time shifting to R to step up with LLE.             PT Education - 01/17/19 1208    Education Details  Reviewed LTGs. Provided and discussed final HEP.    Person(s) Educated  Patient    Methods  Explanation;Demonstration;Handout;Verbal cues    Comprehension  Verbalized understanding;Returned demonstration;Verbal cues required       PT Short Term Goals - 12/07/18 1153      PT SHORT TERM GOAL #1   Title  =LTG         PT Long Term Goals - 01/17/19 1106      PT LONG TERM GOAL #1   Title  Patient will be independent with updated HEP and verbalize plan for continued community-based exercise plan.    Baseline  01/17/19: provided final HEP.    Time  8    Period  Weeks    Status  Achieved      PT LONG TERM GOAL #2   Title  Patient will improve gait velocity to 1.30 ft/sec demonstrating progress towards decr fall  risk    Baseline  01/17/19: .81 ft/sec with RW and mod I.    Time  8    Period  Weeks    Status  Not Met      PT LONG TERM GOAL #3   Title  Pt will amb. short distances (approx. 59') without device for household amb. with LUE support on environmental objects as needed for assist with balance.     Baseline  Pt able to ambulate without using UE support for household distances with supervision.    Time  8    Period  Weeks    Status  Achieved      PT LONG TERM GOAL #4   Title  Patient will ambulate over outdoor paved surfaces modified independent with LRAD, including up/down curb.     Baseline  01/17/19: supervision with large base quad cane up/down curb and verbal cues for initial sequencing. Mod I over outdoor paved surfaces    Time  8    Period  Weeks    Status  Partially Met      PT LONG TERM GOAL #6   Title  Patient will go up/down 12 steps with single rail modified independent     Baseline  01/17/19: mod I ascending 12 steps, supervision for descending, step through pattern    Time  8    Period  Weeks    Status  Partially Met            Plan - 01/17/19 1210    Clinical Impression Statement  Treatment session focused on checking LTGs and finalizing HEP for home to include LE strengthening and lower trunk AROM. Patient has partially met/met most LTGs, excluding gait velocity while ambulating with a RW. Patient demonstrates increased independence with navigating stairs and ambulating on unlevel surfaces/curbs outside with a quad cane. Patient will be discharged from PT and is in  agreement based on progress made.    Rehab Potential  Good    PT Frequency  1x / week    PT Duration  3 weeks    PT Treatment/Interventions  ADLs/Self Care Home Management;Aquatic Therapy;Electrical Stimulation;DME Instruction;Gait training;Stair training;Functional mobility training;Therapeutic activities;Therapeutic exercise;Balance training;Orthotic Fit/Training;Patient/family education;Neuromuscular re-education;Wheelchair mobility training;Passive range of motion;Visual/perceptual remediation/compensation    PT Next Visit Plan  D/C from PT    Consulted and Agree with Plan of Care  Patient       Patient will benefit from skilled therapeutic intervention in order to improve the following deficits and impairments:  Abnormal gait, Decreased activity tolerance, Decreased balance, Decreased endurance, Decreased coordination, Decreased cognition, Decreased knowledge of use of DME, Decreased mobility, Decreased safety awareness, Difficulty walking, Decreased strength, Increased edema, Impaired perceived functional ability, Impaired UE functional use  Visit Diagnosis: 1. Other abnormalities of gait and mobility   2. Other symptoms and signs involving the nervous system   3. Hemiplegia and hemiparesis following cerebral infarction affecting right dominant side ([place])   4. Unsteadiness on feet   5. Muscle weakness (generalized)        Problem List Patient Active Problem List   Diagnosis Date Noted  . Slow transit constipation   . Benign prostatic hyperplasia with urinary retention   . Right hemiparesis ([place])   . Chronic right shoulder pain   . Primary osteoarthritis of right shoulder   . Left basal ganglia embolic stroke ([place]) 03/54/6568  . CVA (cerebral vascular accident) ([place]) 08/31/2018  . Elevated aspartate aminotransferase level 06/16/2015  . H/O nutritional disorder 06/16/2015  .  Excessive salivation 06/16/2015  . 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 Heart palpitations 06/16/2015   . 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 Screening for prostate cancer 08/21/2014  . Atypical chest pain 08/10/2013  . 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    Remaining deficits: Decreased R LE strength, gait abnormalities (decreased R DF/R knee flexion, difficulty weight shifting to [name]), [name] tone.    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 Plan: Patient agrees to discharge.  Patient goals were partially met. Patient is being discharged due to financial reasons.  ?????    Patient has limited visits based on insurance. Saving 5 visits for the rest of the year.      Arliss Journey, PT, DPT 01/17/2019, 12:52 PM  [facility] [address] [place], [place], [address] Phone: [phone]   Fax:  [phone]  Name: Edward Glass MRN: [id number] Date of Birth: 06-15-1956

## 2019-01-17 NOTE — Therapy (Signed)
[facility] [address] [place], [place], [address] Phone: [phone]   Fax:  [phone]  Occupational Therapy Treatment  Patient Details  Name: Edward Glass MRN: [id number] Date of Birth: 1956-05-30 Referring Provider (OT): Dr. Alysia Penna   Encounter Date: 01/17/2019  OT End of Session - 01/17/19 1628    Visit Number  24    Number of Visits  29    Date for OT Re-Evaluation  02/21/19    Authorization Type  BCBS - has 30 visits between OT and PT however if visits occur on the same day counts as one visit.  Pt today stated he wishes to continue with aquatic therapy and use his remaining 5 visits in the pool.    Authorization - Visit Number  25    Authorization - Number of Visits  30   pt wishes to use remaining 5 visits for aquatic therapy   OT Start Time  1301    OT Stop Time  1349    OT Time Calculation (min)  48 min    Activity Tolerance  Patient tolerated treatment well       Past Medical History:  Diagnosis Date  . Atypical chest pain 08/10/2013  . Elevated aspartate aminotransferase level 06/16/2015  . Excessive salivation 06/16/2015  . H/O nutritional disorder 06/16/2015  . 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 Screening for prostate cancer 08/21/2014  . 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 HERNIA REPAIR  2006  . left tricep surgery    . LOOP RECORDER INSERTION N/A 09/04/2018   Procedure: LOOP RECORDER INSERTION;  Surgeon: Evans Lance, MD;  Location: [facility];  Service: Cardiovascular;  Laterality: N/A;  . TEE WITHOUT CARDIOVERSION N/A 09/04/2018   Procedure: TRANSESOPHAGEAL ECHOCARDIOGRAM (TEE);  Surgeon: Jerline Pain, MD;  Location: [facility] ENDOSCOPY;  Service: Cardiovascular;  Laterality: N/A;    There were no vitals filed for this visit.  Subjective Assessment - 01/17/19 1622    Subjective   I want to  continue with the pool and use my remaining 5 visits now    Patient is accompanied by:  Family member   fiance   Pertinent History  L CVA on 09/14/18  Loop recorder, pt taking blood thinner    Patient Stated Goals  walk better and use my hand    Currently in Pain?  Yes    Pain Score  4     Pain Location  Shoulder    Pain Orientation  Right    Pain Descriptors / Indicators  Sore    Pain Type  Chronic pain    Pain Onset  More than a month ago    Pain Frequency  Intermittent    Aggravating Factors   certain movements    Pain Relieving Factors  avoiding those movements         Patient seen for aquatic therapy today.  Treatment took place in water 2.5-4 feet deep depending upon activity.  Pt entered the pool via steps using 1 hand rail and min facilitation/assist for postural alignment and control. Pt with significant improvement today for ability to weight shift appropriately over [name] when descending the stairs.  Aqua stretch techniques to address [name] ankle alignment and dorsiflexion, hip extension, quad stretch with good results.  Addressed isolated [name] hip extension with R knee at 90* flexion and single UE support on pool wall (  15 reps x2) as well as hip ab/adduction with knee at 90* flexion and single UE support ( 15 reps x2) - for first time today pt was able to isolate these movements with only intermittent min facilitation. Pt still requires max assist and facilitation for knee flexion in part due to apraxia and poor sensation as well as weakness.  Addressed functional ambulation in open water today initially with bilateral UE support on therapist and then with single RUE support on therapist with emphasis on normalizing gait speed and cues and facilitation for full weight shift with hip extension onto [name].  Pt needs min facilitation and max cues.  Transitioned into supine with flotation devices for support and used [place] techniques to decrease rigidity in trunk, extremities and head as  well as to facilitate upper/lower trunk dissociation, flexion and elongation of trunk, increasing mobility of trunk.  Used this position for body on arm movement to improve alignment of scapula, as well as humeral head for RUE - today pt able to tolerate 90* of abduction without pain and begin to initiate isolated shoulder ab/adduction with mod facilitation. Transitioned into sitting and addressed chest presses with RUE only using light dumb bell on top of the water with min - mod facilitation as well a horizontal ab/adduction with elbow extension with moderate facilitation - pt able to initiate movement in all directions with RUE with facilitation.  Pt reported decrease in pain in R shoulder by end of session (when I do it in the water I don't have any pain at all).  Pt exited the pool via steps and 1 hand rail using [name] as power leg to ascend with min facilitation and mod cues.  Pt reported he is walking at home without the walker - had pt demonstrated and then educated pt and fiance that walking right now without the walker was significantly increasing the spasticity in his R side and working against therapy goals for improved postural alignment and control. Pt and fiance verbalized understanding. Pt encouraged instead to use walker at all times for now and to focus on carry over of alignment and control pt is working on in therapy.                      OT Short Term Goals - 01/17/19 1623      OT SHORT TERM GOAL #1   Title  Pt and family will be mod I  with RUE positioning/care to address edema and pain in RUE - 11/23/2018 (goal adjusted as pt missed one week)    Status  Achieved      OT SHORT TERM GOAL #2   Title  Pt and family will be mod I with splint wear and care    Status  Achieved      OT SHORT TERM GOAL #3   Title  Pt and dtr will be mod I with HEP for RUE for ROM, addressing pain, functional use of RUE prn    Status  Achieved      OT SHORT TERM GOAL #4   Title  Pt will  report pain in R hand/wrist no greater than 5/10 during HEP.      Status  Achieved      OT SHORT TERM GOAL #5   Title  Pt will demonstrate the ability for 55* of shoulder flexion as prep for functional low reach    Status  Not Met   45-50     OT SHORT  TERM GOAL #6   Title  Pt will be mod I with toilet and tub bench transfers    Status  Achieved   met per pt report     OT SHORT TERM GOAL #7   Title  Pt will be mod I with bathing at shower level    Status  Achieved   met per pt report       OT Long Term Goals - 01/17/19 1624      OT LONG TERM GOAL #1   Title  Pt and family will be mod I with upgraded home activities program 03/23/9370  (recertifcation as pt wishes to use remaining 5 visits for aquatic therapy)    Status  On-going      OT LONG TERM GOAL #2   Title  Pt will demonstrate ability to grasp and hold light weight object in R hand with pain 2/10 or less    Status  Achieved      OT LONG TERM GOAL #3   Title  Pt will demonstrate ability for bilateral low reach to obtain light weigh object     Status  On-going      OT LONG TERM GOAL #4   Title  Pt will be mod I with dressing    Status  Achieved      OT LONG TERM GOAL #5   Title  Pt will be mod I with cutting food, AE prn    Status  Achieved      OT LONG TERM GOAL #6   Title  Pt will demonstrate ability to use RUE as stabilizer at least 75% of the time in simple ADL tasks    Status  On-going      OT LONG TERM GOAL #7   Title  Pt will be mod I with simple snack/be prep at ambulatory level    Status  Achieved      OT LONG TERM GOAL #8   Title  Pt will demonstrate improved postural alignment to decrease pain in RUE as well as decrease risk of falls    Status  On-going            Plan - 01/17/19 1626    Clinical Impression Statement  Pt continues to progress toward goals. Pt with improving functional mobility on land as well as in the water.    OT Occupational Profile and History  Detailed Assessment-  Review of Records and additional review of physical, cognitive, psychosocial history related to current functional performance    Occupational performance deficits (Please refer to evaluation for details):  ADL's;IADL's;Rest and Sleep;Work;Leisure;Social Participation    Body Structure / Function / Physical Skills  ADL;Balance;Decreased knowledge of use of DME;Coordination;Edema;GMC;FMC;Flexibility;IADL;Mobility;Pain;ROM;Strength;Tone;UE functional use;Endurance    Cognitive Skills  Safety Awareness    Rehab Potential  Good    Clinical Decision Making  Multiple treatment options, significant modification of task necessary    Comorbidities Affecting Occupational Performance:  Presence of comorbidities impacting occupational performance    Comorbidities impacting occupational performance description:  HTN, atypical chest pain, chronic R shoulder pain, OA of R shoulder, aphasia, apraxia, poor sensation    Modification or Assistance to Complete Evaluation   Max significant modification of tasks or assist is necessary to complete    OT Frequency  1x / week    OT Duration  --   5 weeks   OT Treatment/Interventions  Self-care/ADL training;Aquatic Therapy;Cryotherapy;Therapeutic exercise;Neuromuscular education;Energy conservation;DME and/or AE instruction;Functional Mobility Training;Cognitive remediation/compensation;Balance training;Splinting;Manual Therapy;Passive range of  motion;Therapeutic activities;Patient/family education    Plan  aquatic therapy to address R shoulder pain prn, RUE reach and functional use, postural alignment, balance and functional mobility    Consulted and Agree with Plan of Care  Patient;Family member/caregiver    Family Member Consulted  fiance at pt's request       Patient will benefit from skilled therapeutic intervention in order to improve the following deficits and impairments:   Body Structure / Function / Physical Skills: ADL, Balance, Decreased knowledge of use of  DME, Coordination, Edema, GMC, FMC, Flexibility, IADL, Mobility, Pain, ROM, Strength, Tone, UE functional use, Endurance Cognitive Skills: Safety Awareness     Visit Diagnosis: 1. Other symptoms and signs involving the nervous system   2. Hemiplegia and hemiparesis following cerebral infarction affecting right dominant side ([place])   3. Unsteadiness on feet   4. Muscle weakness (generalized)   5. Acute pain of right shoulder   6. Abnormal posture   7. Other symptoms and signs involving cognitive functions following cerebral infarction   8. Chronic right shoulder pain       Problem List Patient Active Problem List   Diagnosis Date Noted  . Slow transit constipation   . Benign prostatic hyperplasia with urinary retention   . Right hemiparesis ([place])   . Chronic right shoulder pain   . Primary osteoarthritis of right shoulder   . Left basal ganglia embolic stroke ([place]) 81/44/8185  . CVA (cerebral vascular accident) ([place]) 08/31/2018  . Elevated aspartate aminotransferase level 06/16/2015  . H/O nutritional disorder 06/16/2015  . Excessive salivation 06/16/2015  . 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 Heart palpitations 06/16/2015  . 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 Screening for prostate cancer 08/21/2014  . Atypical chest pain 08/10/2013  . 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 [place], [place], [address] Phone: [phone]   Fax:  [phone]  Name: ROMULO OKRAY MRN: [id number] Date of Birth: 04/27/1956

## 2019-01-17 NOTE — Patient Instructions (Addendum)
Access Code: [email or web address]  URL: [email or web address]  Date: 01/17/2019  Prepared by: Misty Stanley   Exercises Supine Lower Trunk Rotation - 10 reps - 1 sets - 1x daily - 5x weekly Supine Bridge - 10 reps - 1 sets - 5 seconds hold - 1x daily - 5x weekly Sit to Stand without Arm Support - 10 reps - 1-2 sets - 1x daily - 5x weekly Side Stepping with Unilateral Counter Support - 10 reps - 2 sets - 1x daily - 7x weekly Backward Walking with Counter Support - 10 reps - 2 sets - 1x daily - 7x weekly Seated Knee Flexion Extension AROM - 10 reps - 1 sets - 1x daily - 7x weekly

## 2019-01-24 ENCOUNTER — Other Ambulatory Visit: Payer: Self-pay

## 2019-01-24 ENCOUNTER — Emergency Department (HOSPITAL_COMMUNITY)
Admission: EM | Admit: 2019-01-24 | Discharge: 2019-01-24 | Disposition: A | Discharge status: Home / Self Care | Payer: BC Managed Care – PPO | Attending: Emergency Medicine | Admitting: Emergency Medicine

## 2019-01-24 ENCOUNTER — Telehealth: Payer: Self-pay | Admitting: Internal Medicine

## 2019-01-24 ENCOUNTER — Encounter (HOSPITAL_COMMUNITY): Payer: Self-pay

## 2019-01-24 ENCOUNTER — Ambulatory Visit: Payer: BC Managed Care – PPO | Attending: Physician Assistant | Admitting: Occupational Therapy

## 2019-01-24 ENCOUNTER — Emergency Department (HOSPITAL_COMMUNITY): Payer: BC Managed Care – PPO

## 2019-01-24 DIAGNOSIS — Z8673 Personal history of transient ischemic attack (TIA), and cerebral infarction without residual deficits: Secondary | ICD-10-CM | POA: Diagnosis not present

## 2019-01-24 DIAGNOSIS — R293 Abnormal posture: Secondary | ICD-10-CM | POA: Insufficient documentation

## 2019-01-24 DIAGNOSIS — G8929 Other chronic pain: Secondary | ICD-10-CM | POA: Insufficient documentation

## 2019-01-24 DIAGNOSIS — R29818 Other symptoms and signs involving the nervous system: Secondary | ICD-10-CM | POA: Insufficient documentation

## 2019-01-24 DIAGNOSIS — R2681 Unsteadiness on feet: Secondary | ICD-10-CM | POA: Insufficient documentation

## 2019-01-24 DIAGNOSIS — Z7902 Long term (current) use of antithrombotics/antiplatelets: Secondary | ICD-10-CM | POA: Diagnosis not present

## 2019-01-24 DIAGNOSIS — I69351 Hemiplegia and hemiparesis following cerebral infarction affecting right dominant side: Secondary | ICD-10-CM | POA: Insufficient documentation

## 2019-01-24 DIAGNOSIS — Z79899 Other long term (current) drug therapy: Secondary | ICD-10-CM | POA: Diagnosis not present

## 2019-01-24 DIAGNOSIS — R079 Chest pain, unspecified: Secondary | ICD-10-CM | POA: Diagnosis not present

## 2019-01-24 DIAGNOSIS — I69318 Other symptoms and signs involving cognitive functions following cerebral infarction: Secondary | ICD-10-CM | POA: Insufficient documentation

## 2019-01-24 DIAGNOSIS — M6281 Muscle weakness (generalized): Secondary | ICD-10-CM | POA: Insufficient documentation

## 2019-01-24 DIAGNOSIS — Z7982 Long term (current) use of aspirin: Secondary | ICD-10-CM | POA: Insufficient documentation

## 2019-01-24 DIAGNOSIS — M25511 Pain in right shoulder: Secondary | ICD-10-CM | POA: Insufficient documentation

## 2019-01-24 LAB — BASIC METABOLIC PANEL
Anion gap: 9 (ref 5–15)
BUN: 19 mg/dL (ref 8–23)
CO2: 26 mmol/L (ref 22–32)
Calcium: 9.3 mg/dL (ref 8.9–10.3)
Chloride: 103 mmol/L (ref 98–111)
Creatinine, Ser: 0.82 mg/dL (ref 0.61–1.24)
GFR calc Af Amer: >60 mL/min (ref >60)
GFR calc non Af Amer: >60 mL/min (ref >60)
Glucose, Bld: 100 mg/dL — ABNORMAL HIGH (ref 70–99)
Potassium: 3.8 mmol/L (ref 3.5–5.1)
Sodium: 138 mmol/L (ref 135–145)

## 2019-01-24 LAB — CBC
HCT: 47.8 % (ref 39.0–52.0)
Hemoglobin: 15.5 g/dL (ref 13.0–17.0)
MCH: 29.6 pg (ref 26.0–34.0)
MCHC: 32.4 g/dL (ref 30.0–36.0)
MCV: 91.2 fL (ref 80.0–100.0)
Platelets: 319 10*3/uL (ref 150–400)
RBC: 5.24 MIL/uL (ref 4.22–5.81)
RDW: 12.5 % (ref 11.5–15.5)
WBC: 6.5 10*3/uL (ref 4.0–10.5)
nRBC: 0 % (ref 0.0–0.2)

## 2019-01-24 LAB — D-DIMER, QUANTITATIVE: D-Dimer, Quant: 0.44 ug/mL-FEU (ref 0.00–0.50)

## 2019-01-24 LAB — TROPONIN I (HIGH SENSITIVITY)
Troponin I (High Sensitivity): <2 ng/L (ref <18)
Troponin I (High Sensitivity): 3 ng/L (ref <18)

## 2019-01-24 MED ORDER — SODIUM CHLORIDE 0.9% FLUSH: 3.0000 mL | Freq: Once | INTRAVENOUS | Status: DC

## 2019-01-24 NOTE — Therapy (Signed)
[facility] [address] [place], [place], [address] Phone: [phone]   Fax:  [phone]  Occupational Therapy Treatment  Patient Details  Name: Edward Glass MRN: [id number] Date of Birth: April 05, 1956 Referring Provider (OT): Dr. Alysia Penna   Encounter Date: 01/24/2019  OT End of Session - 01/24/19 1403    Visit Number  24   arrive no charge   Number of Visits  29    Date for OT Re-Evaluation  02/21/19    Authorization Type  [employer] - has 30 visits between OT and PT however if visits occur on the same day counts as one visit.  Pt today stated he wishes to continue with aquatic therapy and use his remaining 5 visits in the pool.    Authorization - Visit Number  25    Authorization - Number of Visits  19    OT Start Time  8127       Past Medical History:  Diagnosis Date  . Atypical chest pain 08/10/2013  . Elevated aspartate aminotransferase level 06/16/2015  . Excessive salivation 06/16/2015  . H/O nutritional disorder 06/16/2015  . 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 Screening for prostate cancer 08/21/2014  . 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 HERNIA REPAIR  2006  . left tricep surgery    . LOOP RECORDER INSERTION N/A 09/04/2018   Procedure: LOOP RECORDER INSERTION;  Surgeon: Evans Lance, MD;  Location: [facility];  Service: Cardiovascular;  Laterality: N/A;  . TEE WITHOUT CARDIOVERSION N/A 09/04/2018   Procedure: TRANSESOPHAGEAL ECHOCARDIOGRAM (TEE);  Surgeon: Jerline Pain, MD;  Location: [facility] ENDOSCOPY;  Service: Cardiovascular;  Laterality: N/A;    There were no vitals filed for this visit.  Subjective Assessment - 01/24/19 1400    Subjective   I have intermittent chest pain since last night - about 4-5/10. It feels like indigestion    Patient is accompanied by:  Family member   fiance   Pertinent History  L  CVA on 09/14/18  Loop recorder, pt taking blood thinner    Patient Stated Goals  walk better and use my hand    Currently in Pain?  Yes    Pain Score  5     Pain Location  Chest    Pain Descriptors / Indicators  Other (Comment)   It feels like really bad indigestion   Pain Type  Acute pain    Pain Radiating Towards  pt denies any radiating pain    Pain Onset  Yesterday    Pain Frequency  Intermittent    Aggravating Factors   nothing    Pain Relieving Factors  nothing       Pt arrived today for aquatic therapy and reported he has been experiencing chest pain since last night - "it feels like really bad indigestion." Pt called MD this morning at 8 am and was told by nurse that she would "pass this through to the doctor."  MD office had not returned call by time of aquatic therapy appointment so pt and fiance came to session. Informed pt that it would not be advisable to provide aquatic therapy treatment today, especially in light of impact on cardiac functioning of water.  Pt and fiance verbalized understanding. Strongly encouraged pt to go to the ED should chest pain get any worse or if pt started to experience nausea, dyspnea, sweating, headache, SOB or  pain radiating into arm or chest. Pt and fiance verbalized understanding. Also recommended that pt/fiance call MD office again if they did not hear back within the hour given pt's extensive medical history. Pt and fiance verbalized understanding and session was terminated.                       OT Short Term Goals - 01/17/19 1623      OT SHORT TERM GOAL #1   Title  Pt and family will be mod I  with RUE positioning/care to address edema and pain in RUE - 11/23/2018 (goal adjusted as pt missed one week)    Status  Achieved      OT SHORT TERM GOAL #2   Title  Pt and family will be mod I with splint wear and care    Status  Achieved      OT SHORT TERM GOAL #3   Title  Pt and dtr will be mod I with HEP for RUE for ROM,  addressing pain, functional use of RUE prn    Status  Achieved      OT SHORT TERM GOAL #4   Title  Pt will report pain in R hand/wrist no greater than 5/10 during HEP.      Status  Achieved      OT SHORT TERM GOAL #5   Title  Pt will demonstrate the ability for 55* of shoulder flexion as prep for functional low reach    Status  Not Met   45-50     OT SHORT TERM GOAL #6   Title  Pt will be mod I with toilet and tub bench transfers    Status  Achieved   met per pt report     OT SHORT TERM GOAL #7   Title  Pt will be mod I with bathing at shower level    Status  Achieved   met per pt report       OT Long Term Goals - 01/17/19 1624      OT LONG TERM GOAL #1   Title  Pt and family will be mod I with upgraded home activities program 0/86/5784  (recertifcation as pt wishes to use remaining 5 visits for aquatic therapy)    Status  On-going      OT LONG TERM GOAL #2   Title  Pt will demonstrate ability to grasp and hold light weight object in R hand with pain 2/10 or less    Status  Achieved      OT LONG TERM GOAL #3   Title  Pt will demonstrate ability for bilateral low reach to obtain light weigh object     Status  On-going      OT LONG TERM GOAL #4   Title  Pt will be mod I with dressing    Status  Achieved      OT LONG TERM GOAL #5   Title  Pt will be mod I with cutting food, AE prn    Status  Achieved      OT LONG TERM GOAL #6   Title  Pt will demonstrate ability to use RUE as stabilizer at least 75% of the time in simple ADL tasks    Status  On-going      OT LONG TERM GOAL #7   Title  Pt will be mod I with simple snack/be prep at ambulatory level    Status  Achieved  OT LONG TERM GOAL #8   Title  Pt will demonstrate improved postural alignment to decrease pain in RUE as well as decrease risk of falls    Status  On-going              Patient will benefit from skilled therapeutic intervention in order to improve the following deficits and  impairments:           Visit Diagnosis: 1. Hemiplegia and hemiparesis following cerebral infarction affecting right dominant side [facility])       Problem List Patient Active Problem List   Diagnosis Date Noted  . Slow transit constipation   . Benign prostatic hyperplasia with urinary retention   . Right hemiparesis ([facility])   . Chronic right shoulder pain   . Primary osteoarthritis of right shoulder   . Left basal ganglia embolic stroke ([facility]) 46/95/0722  . CVA (cerebral vascular accident) ([place]) 08/31/2018  . Elevated aspartate aminotransferase level 06/16/2015  . H/O nutritional disorder 06/16/2015  . Excessive salivation 06/16/2015  . 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 Heart palpitations 06/16/2015  . 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 Screening for prostate cancer 08/21/2014  . Atypical chest pain 08/10/2013  . 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 [place], [place], [address] Phone: [phone]   Fax:  [phone]  Name: Edward Glass MRN: [id number] Date of Birth: [birth year]

## 2019-01-24 NOTE — ED Provider Notes (Signed)
[facility] EMERGENCY DEPARTMENT Provider Note   CSN: [id number] Arrival date & time: 01/24/19  1412    History   Chief Complaint Chief Complaint  Patient presents with  . Chest Pain    HPI Edward Glass is a 63 y.o. male.     HPI  Presents with chest pain with breathing, began last night Initially thought it was gas, and person with him recommended he come to ED Left sided chest pain with breathing, frequent burping, still a little tight now. Was sharp before.  3/10 pain.  No shortness of breath, nausea or vomiting, no diaphoresis or lightheadedness Constant since last night No exertional pain, no positional pain  No hx of htn, chol, but does have a hx of stroke No smoking, no etoh, no other drugs No family hx of early heart disease No hx of DVT/PE, no long trips recently   Past Medical History:  Diagnosis Date  . Atypical chest pain 08/10/2013  . Elevated aspartate aminotransferase level 06/16/2015  . Excessive salivation 06/16/2015  . H/O nutritional disorder 06/16/2015  . 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 Screening for prostate cancer 08/21/2014  . 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 Slow transit constipation   . Benign prostatic hyperplasia with urinary retention   . Right hemiparesis ([place])   . Chronic right shoulder pain   . Primary osteoarthritis of right shoulder   . Left basal ganglia embolic stroke ([facility]) 26/37/8588  . CVA (cerebral vascular accident) ([place] [facility]) 08/31/2018  . Elevated aspartate aminotransferase level 06/16/2015  . H/O nutritional disorder 06/16/2015  . Excessive salivation 06/16/2015  . 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 Heart palpitations 06/16/2015  . 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 Screening for prostate cancer 08/21/2014  . Atypical chest pain 08/10/2013  . 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 HERNIA REPAIR  2006  . left tricep surgery    . LOOP RECORDER INSERTION N/A 09/04/2018   Procedure: LOOP RECORDER INSERTION;  Surgeon: Evans Lance, MD;  Location: [facility];  Service: Cardiovascular;  Laterality: N/A;  . TEE WITHOUT CARDIOVERSION N/A 09/04/2018   Procedure: TRANSESOPHAGEAL ECHOCARDIOGRAM (TEE);  Surgeon: Jerline Pain, MD;  Location: [facility] ENDOSCOPY;  Service: Cardiovascular;  Laterality: N/A;        Home Medications    Prior to Admission medications   Medication Sig Start Date End Date Taking? Authorizing Provider  acetaminophen (TYLENOL) 325 MG tablet Take 2 tablets (650 mg total) by mouth every 4 (four) hours as needed for mild pain (or temp > 37.5 C (99.5 F)). 09/29/18   Angiulli, Lavon Paganini, PA-C  alfuzosin (UROXATRAL) 10 MG 24 hr tablet Take 1 tablet (10 mg total) by mouth 3 (three) times a week. 09/29/18   Angiulli, Lavon Paganini, PA-C  aspirin EC 325 MG EC tablet Take 1 tablet (325 mg total) by mouth daily. 09/05/18   Debbe Odea, MD  atorvastatin (LIPITOR) 40 MG tablet Take 1 tablet (40 mg total) by mouth daily at 6 PM. 11/08/18   Venancio Poisson, NP  clopidogrel (PLAVIX) 75 MG tablet Take 1 tablet (75 mg total) by mouth daily. 11/08/18   Venancio Poisson, NP  diclofenac sodium (VOLTAREN) 1 % GEL Apply 2 g topically 4 (four) times daily. 09/29/18   Angiulli, Lavon Paganini, PA-C  FLUoxetine (PROZAC) 20  MG capsule Take 1 capsule (20 mg total) by mouth daily. 11/08/18   Venancio Poisson, NP  hydrocortisone (ANUSOL-HC) 25 MG suppository Place 1 suppository (25 mg total) rectally 2 (two) times daily. 09/29/18   Angiulli, Lavon Paganini, PA-C  hydrocortisone-pramoxine [facility]) rectal foam Place 1 applicator rectally 2 (two) times daily. 09/29/18   Angiulli, Lavon Paganini, PA-C  multivitamin (ONE-A-DAY MEN'S) TABS tablet Take 1 tablet by mouth daily.    [provider]  polyethylene glycol (MIRALAX / GLYCOLAX) packet Take  17 g by mouth 2 (two) times daily. 09/29/18   Angiulli, Lavon Paganini, PA-C  senna-docusate (SENOKOT-S) 8.6-50 MG tablet Take 3 tablets by mouth 2 (two) times daily. 09/29/18   Angiulli, Lavon Paganini, PA-C  tiZANidine (ZANAFLEX) 4 MG tablet Take 1 tablet (4 mg total) by mouth at bedtime. 01/16/19   Kirsteins, Luanna Salk, MD    Family History Family History  Problem Relation Age of Onset  . Kidney failure Mother   . Cancer Mother   . Prostate cancer Father   . Cancer Father   . Diabetes Father   . Colon cancer Neg Hx   . Esophageal cancer Neg Hx   . Rectal cancer Neg Hx   . Stomach cancer Neg Hx     Social History Social History   Tobacco Use  . Smoking status: Never Smoker  . Smokeless tobacco: Never Used  Substance Use Topics  . Alcohol use: No  . Drug use: No     Allergies   Patient has no known allergies.   Review of Systems Review of Systems  Constitutional: Negative for fever.  HENT: Negative for sore throat.   Eyes: Negative for visual disturbance.  Respiratory: Negative for cough and shortness of breath.   Cardiovascular: Positive for chest pain. Negative for leg swelling.  Gastrointestinal: Negative for abdominal pain, nausea and vomiting.  Genitourinary: Negative for difficulty urinating.  Musculoskeletal: Negative for back pain and neck stiffness.  Skin: Negative for rash.  Neurological: Negative for syncope and headaches.     Physical Exam Updated Vital Signs BP 135/89   Pulse (!) 55   Temp 98.4 F (36.9 C) (Oral)   Resp 16   SpO2 99%   Physical Exam Vitals signs and nursing note reviewed.  Constitutional:      General: He is not in acute distress.    Appearance: He is well-developed. He is not diaphoretic.  HENT:     Head: Normocephalic and atraumatic.  Eyes:     Conjunctiva/sclera: Conjunctivae normal.  Neck:     Musculoskeletal: Normal range of motion.  Cardiovascular:     Rate and Rhythm: Normal rate and regular rhythm.     Heart sounds: Normal  heart sounds. No murmur. No friction rub. No gallop.   Pulmonary:     Effort: Pulmonary effort is normal. No respiratory distress.     Breath sounds: Normal breath sounds. No wheezing or rales.  Abdominal:     General: There is no distension.     Palpations: Abdomen is soft.     Tenderness: There is no abdominal tenderness. There is no guarding.  Skin:    General: Skin is warm and dry.  Neurological:     Mental Status: He is alert and oriented to person, place, and time.      ED Treatments / Results  Labs (all labs ordered are listed, but only abnormal results are displayed) Labs Reviewed  BASIC METABOLIC PANEL - Abnormal; Notable for the following components:  Result Value   Glucose, Bld 100 (*)    All other components within normal limits  CBC  TROPONIN I (HIGH SENSITIVITY)  TROPONIN I (HIGH SENSITIVITY)  D-DIMER, QUANTITATIVE (NOT AT [facility])    EKG EKG Interpretation  Date/Time:  Wednesday January 24 2019 14:36:53 EDT Ventricular Rate:  62 PR Interval:  184 QRS Duration: 98 QT Interval:  420 QTC Calculation: 426 R Axis:   71 Text Interpretation:  Normal sinus rhythm Normal ECG No significant change since last tracing Confirmed by Gareth Morgan [id number]) on 01/24/2019 4:06:27 PM   Radiology Dg Chest 2 View  Result Date: 01/24/2019 CLINICAL DATA:  Chest pain EXAM: CHEST - 2 VIEW COMPARISON:  08/30/2018 FINDINGS: Cardiac shadow is within normal limits. Loop recorder is noted. The lungs are clear bilaterally. No acute bony abnormality is seen. IMPRESSION: No active cardiopulmonary disease. Electronically Signed   By: Inez Catalina M.D.   On: 01/24/2019 15:40    Procedures Procedures (including critical care time)  Medications Ordered in ED Medications - No data to display   Initial Impression / Assessment and Plan / ED Course  I have reviewed the triage vital signs and the nursing notes.  Pertinent labs & imaging results that were available during my care of the  patient were reviewed by me and considered in my medical decision making (see chart for details).       63yo male with history of CVA, WPW presents with concern for chest pain.  Differential diagnosis for chest pain includes pulmonary embolus, dissection, pneumothorax, pneumonia, ACS, myocarditis, pericarditis.  EKG was done and evaluate by me and showed no acute ST changes and no signs of pericarditis. Chest x-ray was done and evaluated by me and radiology and showed no sign of pneumonia or pneumothorax. Patient is low risk [place] with a negative ddimer and have low suspicion for PE.  Patient is low risk HEART score (3) and had delta troponins which were both negative. Given this evaluation, history and physical have low suspicion for pulmonary embolus, pneumonia, ACS, myocarditis, pericarditis, dissection.  Recommend follow up with PCP.  Consider GERD or muscular pain. Patient discharged in stable condition with understanding of reasons to return.    Final Clinical Impressions(s) / ED Diagnoses   Final diagnoses:  Chest pain, unspecified type    ED Discharge Orders    None       Gareth Morgan, MD 01/25/19 1030

## 2019-01-24 NOTE — ED Triage Notes (Signed)
Patient complains of left anterior CP with no radiation since last night. Stats feels worse with inspiration. Patient alert and oriented, NAD

## 2019-01-24 NOTE — Telephone Encounter (Signed)
Pt c/o of Chest Pain: STAT if CP now or developed within 24 hours  1. Are you having CP right now? Just cramping pain that comes and goes  2. Are you experiencing any other symptoms (ex. SOB, nausea, vomiting, sweating)? no  3. How long have you been experiencing CP? Cramping pain  4. Is your CP continuous or coming and going? It comes and goes  5. Have you taken Nitroglycerin? no ?

## 2019-01-24 NOTE — Telephone Encounter (Signed)
Returned call to Pt surrogate Dena.  Per Dena Pt has had cramping of his left pectoral muscle area and towards center of chest that is intermittent.  Pt has also been burping a lot.  Pt went for physical therapy and they would not allow him in pool because of symptoms.  Dena states she is taking him to be evaluated somewhere, wanted advice on best place to go.  Advised if Pt/her felt he should be seen best option would be to go to [facility] ER.  Advised Dr. Lovena Le was on premises if ER doctor needed any cardiac assistance.  Advised to call back if any further assistance needed.

## 2019-01-25 ENCOUNTER — Telehealth: Payer: Self-pay

## 2019-01-25 NOTE — Telephone Encounter (Signed)
Pt was evaluated in ER.  Cardiac cause for discomfort ruled out. ER physician suggested either GERD or muscle pain.  Call received from [facility].  Pt continues to have feelings of gas/pain.  She has started Pt on simethicone.  Also Dena notes that these symptoms started after Pt started taking Zanaflex.   Advised to hold Zanaflex.  Continue simethicone per instructions on label.  Also advised to try tylenol to see if that helped with discomfort.   Advised if Pt still felt crampy and uncomfortable on Monday to contact PCP to schedule sooner appt.    Dena indicates understanding.

## 2019-01-25 NOTE — Progress Notes (Signed)
[facility] Summary Report / Loop Recorder 

## 2019-01-31 ENCOUNTER — Encounter: Payer: Self-pay | Admitting: Occupational Therapy

## 2019-01-31 ENCOUNTER — Ambulatory Visit: Payer: BC Managed Care – PPO | Admitting: Occupational Therapy

## 2019-01-31 ENCOUNTER — Other Ambulatory Visit: Payer: Self-pay

## 2019-01-31 DIAGNOSIS — G8929 Other chronic pain: Secondary | ICD-10-CM | POA: Diagnosis present

## 2019-01-31 DIAGNOSIS — R293 Abnormal posture: Secondary | ICD-10-CM

## 2019-01-31 DIAGNOSIS — M6281 Muscle weakness (generalized): Secondary | ICD-10-CM | POA: Diagnosis present

## 2019-01-31 DIAGNOSIS — I69351 Hemiplegia and hemiparesis following cerebral infarction affecting right dominant side: Secondary | ICD-10-CM

## 2019-01-31 DIAGNOSIS — I69318 Other symptoms and signs involving cognitive functions following cerebral infarction: Secondary | ICD-10-CM

## 2019-01-31 DIAGNOSIS — R2681 Unsteadiness on feet: Secondary | ICD-10-CM | POA: Diagnosis present

## 2019-01-31 DIAGNOSIS — R29818 Other symptoms and signs involving the nervous system: Secondary | ICD-10-CM

## 2019-01-31 DIAGNOSIS — M25511 Pain in right shoulder: Secondary | ICD-10-CM | POA: Diagnosis present

## 2019-01-31 NOTE — Therapy (Signed)
[facility] [address] [place], [place], [address] Phone: [phone]   Fax:  [phone]  Occupational Therapy Treatment  Patient Details  Name: Edward Glass MRN: [id number] Date of Birth: 22-Jul-1956 Referring Provider (OT): Dr. Alysia Penna   Encounter Date: 01/31/2019  OT End of Session - 01/31/19 1805    Visit Number  25    Number of Visits  29    Date for OT Re-Evaluation  02/21/19    Authorization Type  [employer] - has 30 visits between OT and PT however if visits occur on the same day counts as one visit.  Pt today stated he wishes to continue with aquatic therapy and use his remaining 5 visits in the pool.    Authorization - Visit Number  26    Authorization - Number of Visits  30    OT Start Time  8144    OT Stop Time  1347    OT Time Calculation (min)  49 min    Activity Tolerance  Patient tolerated treatment well       Past Medical History:  Diagnosis Date  . Atypical chest pain 08/10/2013  . Elevated aspartate aminotransferase level 06/16/2015  . Excessive salivation 06/16/2015  . H/O nutritional disorder 06/16/2015  . 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 Screening for prostate cancer 08/21/2014  . 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 HERNIA REPAIR  2006  . left tricep surgery    . LOOP RECORDER INSERTION N/A 09/04/2018   Procedure: LOOP RECORDER INSERTION;  Surgeon: Evans Lance, MD;  Location: [facility];  Service: Cardiovascular;  Laterality: N/A;  . TEE WITHOUT CARDIOVERSION N/A 09/04/2018   Procedure: TRANSESOPHAGEAL ECHOCARDIOGRAM (TEE);  Surgeon: Jerline Pain, MD;  Location: [facility] END[facility];  Service: Cardiovascular;  Laterality: N/A;    There were no vitals filed for this visit.  Subjective Assessment - 01/31/19 1803    Subjective   I feel better    Pertinent History  L CVA on 09/14/18  Loop recorder,  pt taking blood thinner    Patient Stated Goals  walk better and use my hand    Currently in Pain?  No/denies         Patient seen for aquatic therapy today.  Treatment took place in water 2.5-4 feet deep depending upon activity.  Pt entered the pool via stairs using 1 hand rail and min a for R foot placement as well as cues for bringing R hip over R foot when descending stairs.  Addressed functional ambulation using hand held assist for RUE and at R hip in open water with facilitation for weight shift onto [name] before stepping with LLE.  Initially pt needed mod facilitation and max cues however with repetition and practice pt able to progress to min facilitation and intermittent min cues.  Addressed full single leg stance on [name] using pool wall for support and facilitation through UE's for active lateral weight shift onto [name] and then holding position for slow count of 5 x 15 reps x3.  Utilized this activity to full load [name] and to increase activity of [name] as well as to assist pt in decreasing fear in lateral weight shift to [name] as well as to improve pt's ability to actively weight shift and find mid balance when shifting to [name].  Pt requires significant repetition for all activities due to poor sensation, impaired perception  of midline/body in space and impaired cognition.  Utilized single LUE support on pool wall to address forward and backward walking with emphasis on postural alignment and normalized weight shifting for this task - progressed to only light RUE support on therapist's arm with min facilitation and mod cues for weight shifting especially with backward walking.  Transitioned into supine with flotation devices to mobilize R shoulder girdle focusing first on mobilizing scapula on trunk wall and then mobilizing body on arm to eventually allow for 90* of shoulder flexion as well as 90* of shoulder abduction without pain.  Pt was then able to activate into ab/adduction with cues to "reach with  hand" vs hiking shoulder and cues to grade effort to avoid increasing spasticity.  Transitioned back into standing and using pool wall for support and dumb bell to allow RUE to glide across water addressed low reach pattern with emphasis on forward reaching with hand initiating movement with full elbow extension and then shoulder extension with elbow flexion to bring dumb bell to body. Also addressed active horizontal ab/adduction for RUE using light dumb bell for glide - pt initially required max facilitation but progressed to intermittent min facilitation and mod cues to relax upper trunk and head and to grade effort to decrease spasticity.  Pt exited the pool via steps using one hand railing and using [name] as power leg when ascending steps with cues to push up with [name] vs pulling up with LUE.   *Note: last session pt arrived c/o chest pain. Session was terminated and pt eventually went to ED with fiance where cardiac work up was completed and pt was cleared to return home and resume therapy.                       OT Short Term Goals - 01/31/19 1803      OT SHORT TERM GOAL #1   Title  Pt and family will be mod I  with RUE positioning/care to address edema and pain in RUE - 11/23/2018 (goal adjusted as pt missed one week)    Status  Achieved      OT SHORT TERM GOAL #2   Title  Pt and family will be mod I with splint wear and care    Status  Achieved      OT SHORT TERM GOAL #3   Title  Pt and dtr will be mod I with HEP for RUE for ROM, addressing pain, functional use of RUE prn    Status  Achieved      OT SHORT TERM GOAL #4   Title  Pt will report pain in R hand/wrist no greater than 5/10 during HEP.      Status  Achieved      OT SHORT TERM GOAL #5   Title  Pt will demonstrate the ability for 55* of shoulder flexion as prep for functional low reach    Status  Not Met   45-50     OT SHORT TERM GOAL #6   Title  Pt will be mod I with toilet and tub bench transfers     Status  Achieved   met per pt report     OT SHORT TERM GOAL #7   Title  Pt will be mod I with bathing at shower level    Status  Achieved   met per pt report       OT Long Term Goals - 01/31/19 1803  OT LONG TERM GOAL #1   Title  Pt and family will be mod I with upgraded home activities program 2/94/7654  (recertifcation as pt wishes to use remaining 5 visits for aquatic therapy)    Status  On-going      OT LONG TERM GOAL #2   Title  Pt will demonstrate ability to grasp and hold light weight object in R hand with pain 2/10 or less    Status  Achieved      OT LONG TERM GOAL #3   Title  Pt will demonstrate ability for bilateral low reach to obtain light weigh object     Status  On-going      OT LONG TERM GOAL #4   Title  Pt will be mod I with dressing    Status  Achieved      OT LONG TERM GOAL #5   Title  Pt will be mod I with cutting food, AE prn    Status  Achieved      OT LONG TERM GOAL #6   Title  Pt will demonstrate ability to use RUE as stabilizer at least 75% of the time in simple ADL tasks    Status  On-going      OT LONG TERM GOAL #7   Title  Pt will be mod I with simple snack/be prep at ambulatory level    Status  Achieved      OT LONG TERM GOAL #8   Title  Pt will demonstrate improved postural alignment to decrease pain in RUE as well as decrease risk of falls    Status  On-going            Plan - 01/31/19 1804    Clinical Impression Statement  Pt continues wtih slow but steady progress toward goals. Pt with improving gait speed and postural control in water as well as on land    OT Occupational Profile and History  Detailed Assessment- Review of Records and additional review of physical, cognitive, psychosocial history related to current functional performance    Occupational performance deficits (Please refer to evaluation for details):  ADL's;IADL's;Rest and Sleep;Work;Leisure;Social Participation    Body Structure / Function / Physical Skills   ADL;Balance;Decreased knowledge of use of DME;Coordination;Edema;GMC;FMC;Flexibility;IADL;Mobility;Pain;ROM;Strength;Tone;UE functional use;Endurance    Cognitive Skills  Safety Awareness    Rehab Potential  Good    Clinical Decision Making  Multiple treatment options, significant modification of task necessary    Comorbidities Affecting Occupational Performance:  Presence of comorbidities impacting occupational performance    Comorbidities impacting occupational performance description:  HTN, atypical chest pain, chronic R shoulder pain, OA of R shoulder, aphasia, apraxia, poor sensation    Modification or Assistance to Complete Evaluation   Max significant modification of tasks or assist is necessary to complete    OT Frequency  1x / week    OT Duration  Other (comment)   5 weeks   OT Treatment/Interventions  Self-care/ADL training;Aquatic Therapy;Cryotherapy;Therapeutic exercise;Neuromuscular education;Energy conservation;DME and/or AE instruction;Functional Mobility Training;Cognitive remediation/compensation;Balance training;Splinting;Manual Therapy;Passive range of motion;Therapeutic activities;Patient/family education    Plan  aquatic therapy to address R shoulder pain prn, RUE reach and functional use, postural alignment, balance and functional mobility    Consulted and Agree with Plan of Care  Patient;Family member/caregiver    Family Member Consulted  fiance       Patient will benefit from skilled therapeutic intervention in order to improve the following deficits and impairments:   Body Structure / Function /  Physical Skills: ADL, Balance, Decreased knowledge of use of DME, Coordination, Edema, GMC, FMC, Flexibility, IADL, Mobility, Pain, ROM, Strength, Tone, UE functional use, Endurance Cognitive Skills: Safety Awareness     Visit Diagnosis: 1. Hemiplegia and hemiparesis following cerebral infarction affecting right dominant side (HCC)   2. Other symptoms and signs involving the  nervous system   3. Unsteadiness on feet   4. Muscle weakness (generalized)   5. Acute pain of right shoulder   6. Abnormal posture   7. Other symptoms and signs involving cognitive functions following cerebral infarction   8. Chronic right shoulder pain       Problem List Patient Active Problem List   Diagnosis Date Noted  . Slow transit constipation   . Benign prostatic hyperplasia with urinary retention   . Right hemiparesis ([facility])   . Chronic right shoulder pain   . Primary osteoarthritis of right shoulder   . Left basal ganglia embolic stroke ([facility]) 95/36/9223  . CVA (cerebral vascular accident) ([facility]) 08/31/2018  . Elevated aspartate aminotransferase level 06/16/2015  . H/O nutritional disorder 06/16/2015  . Excessive salivation 06/16/2015  . 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 Heart palpitations 06/16/2015  . 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 Screening for prostate cancer 08/21/2014  . Atypical chest pain 08/10/2013  . 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

## 2019-02-07 ENCOUNTER — Other Ambulatory Visit: Payer: Self-pay

## 2019-02-07 ENCOUNTER — Ambulatory Visit: Payer: BC Managed Care – PPO | Admitting: Occupational Therapy

## 2019-02-07 ENCOUNTER — Encounter: Payer: Self-pay | Admitting: Occupational Therapy

## 2019-02-07 DIAGNOSIS — I69318 Other symptoms and signs involving cognitive functions following cerebral infarction: Secondary | ICD-10-CM

## 2019-02-07 DIAGNOSIS — R29818 Other symptoms and signs involving the nervous system: Secondary | ICD-10-CM

## 2019-02-07 DIAGNOSIS — M6281 Muscle weakness (generalized): Secondary | ICD-10-CM

## 2019-02-07 DIAGNOSIS — R2681 Unsteadiness on feet: Secondary | ICD-10-CM

## 2019-02-07 DIAGNOSIS — M25511 Pain in right shoulder: Secondary | ICD-10-CM

## 2019-02-07 DIAGNOSIS — I69351 Hemiplegia and hemiparesis following cerebral infarction affecting right dominant side: Secondary | ICD-10-CM | POA: Diagnosis not present

## 2019-02-07 DIAGNOSIS — G8929 Other chronic pain: Secondary | ICD-10-CM

## 2019-02-07 DIAGNOSIS — R293 Abnormal posture: Secondary | ICD-10-CM

## 2019-02-07 NOTE — Therapy (Signed)
[facility] [address] [place], [place], [address] Phone: [phone]   Fax:  [phone]  Occupational Therapy Treatment  Patient Details  Name: Edward Glass MRN: [id number] Date of Birth: January 03, 1956 Referring Provider (OT): Dr. Alysia Penna   Encounter Date: 02/07/2019  OT End of Session - 02/07/19 1637    Visit Number  26    Number of Visits  29    Date for OT Re-Evaluation  02/21/19    Authorization Type  [employer] - has 30 visits between OT and PT however if visits occur on the same day counts as one visit.  Pt today stated he wishes to continue with aquatic therapy and use his remaining 5 visits in the pool.    Authorization - Visit Number  27    Authorization - Number of Visits  30    OT Start Time  1301    OT Stop Time  1346    OT Time Calculation (min)  45 min    Activity Tolerance  Patient tolerated treatment well       Past Medical History:  Diagnosis Date  . Atypical chest pain 08/10/2013  . Elevated aspartate aminotransferase level 06/16/2015  . Excessive salivation 06/16/2015  . H/O nutritional disorder 06/16/2015  . 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 Screening for prostate cancer 08/21/2014  . 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 HERNIA REPAIR  2006  . left tricep surgery    . LOOP RECORDER INSERTION N/A 09/04/2018   Procedure: LOOP RECORDER INSERTION;  Surgeon: Evans Lance, MD;  Location: [facility];  Service: Cardiovascular;  Laterality: N/A;  . TEE WITHOUT CARDIOVERSION N/A 09/04/2018   Procedure: TRANSESOPHAGEAL ECHOCARDIOGRAM (TEE);  Surgeon: Jerline Pain, MD;  Location: [facility] ENDOSCOPY;  Service: Cardiovascular;  Laterality: N/A;    There were no vitals filed for this visit.  Subjective Assessment - 02/07/19 1634    Subjective   My arm feels tight    Pertinent History  L CVA on 09/14/18  Loop  recorder, pt taking blood thinner    Patient Stated Goals  walk better and use my hand    Currently in Pain?  Yes    Pain Score  5     Pain Location  Shoulder    Pain Orientation  Right    Pain Descriptors / Indicators  Sore    Pain Type  Chronic pain    Pain Onset  More than a month ago    Pain Frequency  Intermittent    Aggravating Factors   certain movements    Pain Relieving Factors  not sure       Patient seen for aquatic therapy today.  Treatment took place in water 2.5-4 feet deep depending upon activity.  Pt entered the pool via steps using 1 hand rail, min assist and min cues for postural alignment and control while descending steps. Addressed postural alignment and control during functional ambulation in open water with light UE support using small dumb bells with min facilitation to increase weight bearing and fully shift onto [name] during functional mobility.  Addressed isolated weight shift using 1 single UE support while transitioning from LLE to [name] with min facilitation and cues to full weight shift over [name]. Progressed to no UE support to allow the water to provide immediate feedback to the pt regarding COG over BOS when shifting onto [name]. Pt  tends to attempt to laterally weight shift onto [name] vs anterior lateral weight shift and overshoots to R and is posterior. With isolated practice pt able to demonstrate improvement and complete with close supervision and min cues. Then incorporated into functional ambulation in open water with intermittent min facilitation. Pt requires significant practice due to poor sensation, apraxia and impaired cognition.  Transitioned pt into supine using flotation devices to address scapular mob/R shoulder girdle alignment using aqua stretch techniques, Bad Ragazz techniques and body on arm movements.  Able to progress pt to 90* of abduction and 100* of shoulder flexion without pain.  Addressed AAROM in abduction and shoulder flexion in small ranges as pt  has great difficulty with grading effort and thus significantly increases tone and rigidity with any attempt at movement with the RUE. Also addressed trunk rigidity and trunk mobility using Bad Ragazz techniques in supine.  Transitioned into standing and pt exited pool using [name] as power leg to ascend stairs and one hand railing with min stability at hips and cues to power up with [name].  Pt able to complete today with excellent postural alignment when ascending the stairs.                        OT Short Term Goals - 02/07/19 1635      OT SHORT TERM GOAL #1   Title  Pt and family will be mod I  with RUE positioning/care to address edema and pain in RUE - 11/23/2018 (goal adjusted as pt missed one week)    Status  Achieved      OT SHORT TERM GOAL #2   Title  Pt and family will be mod I with splint wear and care    Status  Achieved      OT SHORT TERM GOAL #3   Title  Pt and dtr will be mod I with HEP for RUE for ROM, addressing pain, functional use of RUE prn    Status  Achieved      OT SHORT TERM GOAL #4   Title  Pt will report pain in R hand/wrist no greater than 5/10 during HEP.      Status  Achieved      OT SHORT TERM GOAL #5   Title  Pt will demonstrate the ability for 55* of shoulder flexion as prep for functional low reach    Status  Not Met   45-50     OT SHORT TERM GOAL #6   Title  Pt will be mod I with toilet and tub bench transfers    Status  Achieved   met per pt report     OT SHORT TERM GOAL #7   Title  Pt will be mod I with bathing at shower level    Status  Achieved   met per pt report       OT Long Term Goals - 02/07/19 1635      OT LONG TERM GOAL #1   Title  Pt and family will be mod I with upgraded home activities program 10/16/5571  (recertifcation as pt wishes to use remaining 5 visits for aquatic therapy)    Status  On-going      OT LONG TERM GOAL #2   Title  Pt will demonstrate ability to grasp and hold light weight object in R hand  with pain 2/10 or less    Status  Achieved      OT LONG TERM  GOAL #3   Title  Pt will demonstrate ability for bilateral low reach to obtain light weigh object     Status  On-going      OT LONG TERM GOAL #4   Title  Pt will be mod I with dressing    Status  Achieved      OT LONG TERM GOAL #5   Title  Pt will be mod I with cutting food, AE prn    Status  Achieved      OT LONG TERM GOAL #6   Title  Pt will demonstrate ability to use RUE as stabilizer at least 75% of the time in simple ADL tasks    Status  On-going      OT LONG TERM GOAL #7   Title  Pt will be mod I with simple snack/be prep at ambulatory level    Status  Achieved      OT LONG TERM GOAL #8   Title  Pt will demonstrate improved postural alignment to decrease pain in RUE as well as decrease risk of falls    Status  On-going            Plan - 02/07/19 1636    Clinical Impression Statement  Pt progressing toward goals. Pt demonstrating improvement in postural alignment and increased PROM/AAROM in RUE during aquatic session.    OT Occupational Profile and History  Detailed Assessment- Review of Records and additional review of physical, cognitive, psychosocial history related to current functional performance    Occupational performance deficits (Please refer to evaluation for details):  ADL's;IADL's;Rest and Sleep;Work;Leisure;Social Participation    Body Structure / Function / Physical Skills  ADL;Balance;Decreased knowledge of use of DME;Coordination;Edema;GMC;FMC;Flexibility;IADL;Mobility;Pain;ROM;Strength;Tone;UE functional use;Endurance    Cognitive Skills  Safety Awareness    Rehab Potential  Good    Clinical Decision Making  Multiple treatment options, significant modification of task necessary    Comorbidities Affecting Occupational Performance:  Presence of comorbidities impacting occupational performance    Comorbidities impacting occupational performance description:  HTN, atypical chest pain, chronic R  shoulder pain, OA of R shoulder, aphasia, apraxia, poor sensation    Modification or Assistance to Complete Evaluation   Max significant modification of tasks or assist is necessary to complete    OT Frequency  1x / week    OT Duration  Other (comment)   5 weeks   OT Treatment/Interventions  Self-care/ADL training;Aquatic Therapy;Cryotherapy;Therapeutic exercise;Neuromuscular education;Energy conservation;DME and/or AE instruction;Functional Mobility Training;Cognitive remediation/compensation;Balance training;Splinting;Manual Therapy;Passive range of motion;Therapeutic activities;Patient/family education    Plan  aquatic therapy to address R shoulder pain prn, RUE reach and functional use, postural alignment, balance and functional mobility    Consulted and Agree with Plan of Care  Patient;Family member/caregiver    Family Member Consulted  fiance       Patient will benefit from skilled therapeutic intervention in order to improve the following deficits and impairments:   Body Structure / Function / Physical Skills: ADL, Balance, Decreased knowledge of use of DME, Coordination, Edema, GMC, FMC, Flexibility, IADL, Mobility, Pain, ROM, Strength, Tone, UE functional use, Endurance Cognitive Skills: Safety Awareness     Visit Diagnosis: 1. Hemiplegia and hemiparesis following cerebral infarction affecting right dominant side (HCC)   2. Other symptoms and signs involving the nervous system   3. Unsteadiness on feet   4. Muscle weakness (generalized)   5. Acute pain of right shoulder   6. Abnormal posture   7. Other symptoms and signs involving cognitive functions following  cerebral infarction   8. Chronic right shoulder pain       Problem List Patient Active Problem List   Diagnosis Date Noted  . Slow transit constipation   . Benign prostatic hyperplasia with urinary retention   . Right hemiparesis ([facility])   . Chronic right shoulder pain   . Primary osteoarthritis of right shoulder    . Left basal ganglia embolic stroke ([facility]) 33/88/2666  . CVA (cerebral vascular accident) ([facility]) 08/31/2018  . Elevated aspartate aminotransferase level 06/16/2015  . H/O nutritional disorder 06/16/2015  . Excessive salivation 06/16/2015  . 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 Heart palpitations 06/16/2015  . 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 Screening for prostate cancer 08/21/2014  . Atypical chest pain 08/10/2013  . 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

## 2019-02-13 ENCOUNTER — Other Ambulatory Visit: Payer: Self-pay

## 2019-02-13 ENCOUNTER — Encounter

## 2019-02-13 ENCOUNTER — Encounter: Payer: BC Managed Care – PPO | Attending: Registered Nurse | Admitting: Physical Medicine & Rehabilitation

## 2019-02-13 VITALS — BP 144/85 | HR 67 | Temp 97.7°F | Ht 65.0 in | Wt 166.0 lb

## 2019-02-13 DIAGNOSIS — G8191 Hemiplegia, unspecified affecting right dominant side: Secondary | ICD-10-CM | POA: Insufficient documentation

## 2019-02-13 DIAGNOSIS — G8111 Spastic hemiplegia affecting right dominant side: Secondary | ICD-10-CM

## 2019-02-13 DIAGNOSIS — E785 Hyperlipidemia, unspecified: Secondary | ICD-10-CM | POA: Insufficient documentation

## 2019-02-13 DIAGNOSIS — I639 Cerebral infarction, unspecified: Secondary | ICD-10-CM | POA: Insufficient documentation

## 2019-02-13 NOTE — Patient Instructions (Signed)
Spinner knob  Left foot accelerator

## 2019-02-13 NOTE — Progress Notes (Signed)
Botox Injection for spasticity using needle EMG guidance  Dilution: 50U Units/ml Indication: Severe spasticity which interferes with ADL,mobility and/or  hygiene and is unresponsive to medication management and other conservative care Informed consent was obtained after describing risks and benefits of the procedure with the patient. This includes bleeding, bruising, infection, excessive weakness, or medication side effects. A REMS form is on file and signed. Needle: 25 2"  needle electrode Number of units per muscle Botox  Right  Pec 100, 75U in clavicular head  Bicep 50 1+ EMG Pronator 50 2=EMG  Hamstring 100U  All injections were done after obtaining appropriate EMG activity and after negative drawback for blood. The patient tolerated the procedure well. Post procedure instructions were given. A followup appointment was made.

## 2019-02-14 ENCOUNTER — Other Ambulatory Visit: Payer: Self-pay

## 2019-02-14 ENCOUNTER — Ambulatory Visit: Payer: BC Managed Care – PPO | Admitting: Occupational Therapy

## 2019-02-14 ENCOUNTER — Encounter: Payer: Self-pay | Admitting: Occupational Therapy

## 2019-02-14 DIAGNOSIS — I69351 Hemiplegia and hemiparesis following cerebral infarction affecting right dominant side: Secondary | ICD-10-CM

## 2019-02-14 DIAGNOSIS — G8929 Other chronic pain: Secondary | ICD-10-CM

## 2019-02-14 DIAGNOSIS — M25511 Pain in right shoulder: Secondary | ICD-10-CM

## 2019-02-14 DIAGNOSIS — R2681 Unsteadiness on feet: Secondary | ICD-10-CM

## 2019-02-14 DIAGNOSIS — R29818 Other symptoms and signs involving the nervous system: Secondary | ICD-10-CM

## 2019-02-14 DIAGNOSIS — R293 Abnormal posture: Secondary | ICD-10-CM

## 2019-02-14 DIAGNOSIS — I69318 Other symptoms and signs involving cognitive functions following cerebral infarction: Secondary | ICD-10-CM

## 2019-02-14 DIAGNOSIS — M6281 Muscle weakness (generalized): Secondary | ICD-10-CM

## 2019-02-14 NOTE — Therapy (Signed)
[facility] [address] [place], [place], [address] Phone: [phone]   Fax:  [phone]  Occupational Therapy Treatment  Patient Details  Name: Edward Glass MRN: [id number] Date of Birth: Aug 27, 1955 Referring Provider (OT): Dr. Alysia Penna   Encounter Date: 02/14/2019  OT End of Session - 02/14/19 1432    Visit Number  27    Number of Visits  29    Date for OT Re-Evaluation  02/21/19    Authorization Type  [employer] - has 30 visits between OT and PT however if visits occur on the same day counts as one visit.  Pt today stated he wishes to continue with aquatic therapy and use his remaining 5 visits in the pool.    Authorization - Visit Number  28    Authorization - Number of Visits  30    OT Start Time  1059    OT Stop Time  1145    OT Time Calculation (min)  46 min    Activity Tolerance  Patient tolerated treatment well       Past Medical History:  Diagnosis Date  . Atypical chest pain 08/10/2013  . Elevated aspartate aminotransferase level 06/16/2015  . Excessive salivation 06/16/2015  . H/O nutritional disorder 06/16/2015  . 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 Screening for prostate cancer 08/21/2014  . 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 HERNIA REPAIR  2006  . left tricep surgery    . LOOP RECORDER INSERTION N/A 09/04/2018   Procedure: LOOP RECORDER INSERTION;  Surgeon: Evans Lance, MD;  Location: [facility];  Service: Cardiovascular;  Laterality: N/A;  . TEE WITHOUT CARDIOVERSION N/A 09/04/2018   Procedure: TRANSESOPHAGEAL ECHOCARDIOGRAM (TEE);  Surgeon: Jerline Pain, MD;  Location: [facility] ENDOSCOPY;  Service: Cardiovascular;  Laterality: N/A;    There were no vitals filed for this visit.  Subjective Assessment - 02/14/19 1428    Subjective   I can see that I am moving better    Patient is accompanied by:   Family member   fiance   Pertinent History  L CVA on 09/14/18  Loop recorder, pt taking blood thinner    Patient Stated Goals  walk better and use my hand    Currently in Pain?  Yes    Pain Score  5     Pain Location  Shoulder    Pain Orientation  Right    Pain Descriptors / Indicators  Sore    Pain Type  Chronic pain    Pain Onset  More than a month ago    Pain Frequency  Intermittent    Aggravating Factors   certain movements    Pain Relieving Factors  not sure          Patient seen for aquatic therapy today.  Treatment took place in water 2.5-4 feet deep depending upon activity.  Pt entered the pool via steps with 1 hand railing and min a for R foot placement while descending stairs as well as cues for postural alignment and control.  Utilized aqua stretch techniques to reduce tightness/spasticity in [name] and trunk prior to addressing functional ambulation. Pt tolerated well.  Functional ambulation in open water with light UE support using small dumb bells and min facilitation for anterior lateral weight shift onto [name] (vs just lateral) with hip extension and active [name] for improved control. Also addressed dynamic standing balance  with this activity as pt tends to be posterior and overshoot to R with weight shift - water provided immediate feedback about COG relative to BOS.  Addressed breaking steps down by having pt work on simply transitioning weight from LLE to [name] in standing using first LUE support and then only light RUE support with min facilitation for full weight shift and for active hip extension and abduction as well as active knee extension when translating weight to [name].  Backwards walking to encourage more active [name] hip extension with upper trunk extension (min facilitation). Then progressed to incorporation of this into functional ambulation in open water with light UE support with pt demonstrating improvement.  Transitioned into supine using flotation devices to address RUE and  trunk spasticity using Bad Ragazz techniques progressing to first mobilization of R shoulder girdle using body on arm movements as well as scapular mobilization; pt then able to actively participate in shoulder ab/adduction as well as shoulder flexion (AAROM).  Pt tolerated abduction to 90* without pain and 100* of shoulder flexion without pain.  Transitioned back into standing and isolated RUE for chest presses (mod - min facilitation) and horizontal ab/adduction with elbow extension (min facilitation and mod cues).  Pt exited pool using stairs and 1 hand rails with steadying assist and using [name] as power leg demonstrating excellent control and improved postural alignment.  Discussed with pt possible HEP for pool as pt belongs to gym that has a pool.  Pt was not sure if he would feel comfortable being in the water with his daughter or not. Will continue discussion next session.                       OT Short Term Goals - 02/14/19 1429      OT SHORT TERM GOAL #1   Title  Pt and family will be mod I  with RUE positioning/care to address edema and pain in RUE - 11/23/2018 (goal adjusted as pt missed one week)    Status  Achieved      OT SHORT TERM GOAL #2   Title  Pt and family will be mod I with splint wear and care    Status  Achieved      OT SHORT TERM GOAL #3   Title  Pt and dtr will be mod I with HEP for RUE for ROM, addressing pain, functional use of RUE prn    Status  Achieved      OT SHORT TERM GOAL #4   Title  Pt will report pain in R hand/wrist no greater than 5/10 during HEP.      Status  Achieved      OT SHORT TERM GOAL #5   Title  Pt will demonstrate the ability for 55* of shoulder flexion as prep for functional low reach    Status  Not Met   45-50     OT SHORT TERM GOAL #6   Title  Pt will be mod I with toilet and tub bench transfers    Status  Achieved   met per pt report     OT SHORT TERM GOAL #7   Title  Pt will be mod I with bathing at shower level     Status  Achieved   met per pt report       OT Long Term Goals - 02/14/19 1430      OT LONG TERM GOAL #1   Title  Pt and family  will be mod I with upgraded home activities program 1/44/8185  (recertifcation as pt wishes to use remaining 5 visits for aquatic therapy)    Status  On-going      OT LONG TERM GOAL #2   Title  Pt will demonstrate ability to grasp and hold light weight object in R hand with pain 2/10 or less    Status  Achieved      OT LONG TERM GOAL #3   Title  Pt will demonstrate ability for bilateral low reach to obtain light weigh object     Status  On-going      OT LONG TERM GOAL #4   Title  Pt will be mod I with dressing    Status  Achieved      OT LONG TERM GOAL #5   Title  Pt will be mod I with cutting food, AE prn    Status  Achieved      OT LONG TERM GOAL #6   Title  Pt will demonstrate ability to use RUE as stabilizer at least 75% of the time in simple ADL tasks    Status  Partially Met   can use RUE as stabilizer with cues 50% of the time     OT LONG TERM GOAL #7   Title  Pt will be mod I with simple snack/be prep at ambulatory level    Status  Achieved      OT LONG TERM GOAL #8   Title  Pt will demonstrate improved postural alignment to decrease pain in RUE as well as decrease risk of falls    Status  On-going            Plan - 02/14/19 1431    Clinical Impression Statement  Pt continues to slowly improve in postural alignment and control especially related to functional mobility    OT Occupational Profile and History  Detailed Assessment- Review of Records and additional review of physical, cognitive, psychosocial history related to current functional performance    Occupational performance deficits (Please refer to evaluation for details):  ADL's;IADL's;Rest and Sleep;Work;Leisure;Social Participation    Body Structure / Function / Physical Skills  ADL;Balance;Decreased knowledge of use of  DME;Coordination;Edema;GMC;FMC;Flexibility;IADL;Mobility;Pain;ROM;Strength;Tone;UE functional use;Endurance    Cognitive Skills  Safety Awareness    Rehab Potential  Good    Clinical Decision Making  Multiple treatment options, significant modification of task necessary    Comorbidities Affecting Occupational Performance:  Presence of comorbidities impacting occupational performance    Comorbidities impacting occupational performance description:  HTN, atypical chest pain, chronic R shoulder pain, OA of R shoulder, aphasia, apraxia, poor sensation    Modification or Assistance to Complete Evaluation   Max significant modification of tasks or assist is necessary to complete    OT Duration  Other (comment)   5 weeks   OT Treatment/Interventions  Self-care/ADL training;Aquatic Therapy;Cryotherapy;Therapeutic exercise;Neuromuscular education;Energy conservation;DME and/or AE instruction;Functional Mobility Training;Cognitive remediation/compensation;Balance training;Splinting;Manual Therapy;Passive range of motion;Therapeutic activities;Patient/family education    Plan  aquatic therapy to address R shoulder pain prn, RUE reach and functional use, postural alignment, balance and functional mobility    Consulted and Agree with Plan of Care  Patient;Family member/caregiver    Family Member Consulted  fiance       Patient will benefit from skilled therapeutic intervention in order to improve the following deficits and impairments:   Body Structure / Function / Physical Skills: ADL, Balance, Decreased knowledge of use of DME, Coordination, Edema, GMC, FMC, Flexibility, IADL, Mobility,  Pain, ROM, Strength, Tone, UE functional use, Endurance Cognitive Skills: Safety Awareness     Visit Diagnosis: 1. Hemiplegia and hemiparesis following cerebral infarction affecting right dominant side (HCC)   2. Other symptoms and signs involving the nervous system   3. Unsteadiness on feet   4. Muscle weakness  (generalized)   5. Acute pain of right shoulder   6. Abnormal posture   7. Other symptoms and signs involving cognitive functions following cerebral infarction   8. Chronic right shoulder pain       Problem List Patient Active Problem List   Diagnosis Date Noted  . Slow transit constipation   . Benign prostatic hyperplasia with urinary retention   . Spastic hemiparesis of right dominant side ([place])   . Chronic right shoulder pain   . Primary osteoarthritis of right shoulder   . Left basal ganglia embolic stroke ([place]) 29/09/7953  . CVA (cerebral vascular accident) ([facility]) 08/31/2018  . Elevated aspartate aminotransferase level 06/16/2015  . H/O nutritional disorder 06/16/2015  . Excessive salivation 06/16/2015  . 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 Heart palpitations 06/16/2015  . 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 Screening for prostate cancer 08/21/2014  . Atypical chest pain 08/10/2013  . 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 [place], [place], [address] Phone: [phone]   Fax:  [phone]  Name: Edward Glass MRN: [id number] Date of Birth: 06-Aug-1955

## 2019-02-16 ENCOUNTER — Ambulatory Visit (INDEPENDENT_AMBULATORY_CARE_PROVIDER_SITE_OTHER): Payer: BC Managed Care – PPO | Admitting: *Deleted

## 2019-02-16 DIAGNOSIS — I63512 Cerebral infarction due to unspecified occlusion or stenosis of left middle cerebral artery: Secondary | ICD-10-CM | POA: Diagnosis not present

## 2019-02-17 LAB — CUP PACEART REMOTE DEVICE CHECK
Date Time Interrogation Session: 20200725001136
Implantable Pulse Generator Implant Date: 20200210

## 2019-02-20 ENCOUNTER — Ambulatory Visit: Payer: BLUE CROSS/BLUE SHIELD | Admitting: Adult Health

## 2019-02-21 ENCOUNTER — Other Ambulatory Visit: Payer: Self-pay

## 2019-02-21 ENCOUNTER — Encounter: Payer: Self-pay | Admitting: Occupational Therapy

## 2019-02-21 ENCOUNTER — Ambulatory Visit: Payer: BC Managed Care – PPO | Admitting: Occupational Therapy

## 2019-02-21 DIAGNOSIS — M25511 Pain in right shoulder: Secondary | ICD-10-CM

## 2019-02-21 DIAGNOSIS — R2681 Unsteadiness on feet: Secondary | ICD-10-CM

## 2019-02-21 DIAGNOSIS — R29818 Other symptoms and signs involving the nervous system: Secondary | ICD-10-CM

## 2019-02-21 DIAGNOSIS — G8929 Other chronic pain: Secondary | ICD-10-CM

## 2019-02-21 DIAGNOSIS — I69351 Hemiplegia and hemiparesis following cerebral infarction affecting right dominant side: Secondary | ICD-10-CM | POA: Diagnosis not present

## 2019-02-21 DIAGNOSIS — M6281 Muscle weakness (generalized): Secondary | ICD-10-CM

## 2019-02-21 DIAGNOSIS — I69318 Other symptoms and signs involving cognitive functions following cerebral infarction: Secondary | ICD-10-CM

## 2019-02-21 DIAGNOSIS — R293 Abnormal posture: Secondary | ICD-10-CM

## 2019-02-21 NOTE — Therapy (Addendum)
[facility] [address] [place], [place], [address] Phone: [phone]   Fax:  [phone]  Occupational Therapy Treatment  Patient Details  Name: Edward Glass MRN: [id number] Date of Birth: [birth year] Referring Provider (OT): Dr. Alysia Penna   Encounter Date: 02/21/2019  OT End of Session - 02/21/19 1620    Visit Number  28    Number of Visits  29    Date for OT Re-Evaluation  03/14/19   date adjusted as pt missed one session due to health reasons and then due to hold first two weeks in August due to scheduling   Authorization Type  [employer] - has 30 visits between OT and PT however if visits occur on the same day counts as one visit.  Pt today stated he wishes to continue with aquatic therapy and use his remaining 5 visits in the pool.    Authorization - Visit Number  29    Authorization - Number of Visits  30    OT Start Time  1301    OT Stop Time  1349    OT Time Calculation (min)  48 min    Activity Tolerance  Patient tolerated treatment well       Past Medical History:  Diagnosis Date  . Atypical chest pain 08/10/2013  . Elevated aspartate aminotransferase level 06/16/2015  . Excessive salivation 06/16/2015  . H/O nutritional disorder 06/16/2015  . 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 Screening for prostate cancer 08/21/2014  . 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 HERNIA REPAIR  2006  . left tricep surgery    . LOOP RECORDER INSERTION N/A 09/04/2018   Procedure: LOOP RECORDER INSERTION;  Surgeon: Evans Lance, MD;  Location: [facility];  Service: Cardiovascular;  Laterality: N/A;  . TEE WITHOUT CARDIOVERSION N/A 09/04/2018   Procedure: TRANSESOPHAGEAL ECHOCARDIOGRAM (TEE);  Surgeon: Jerline Pain, MD;  Location: [facility] ENDOSCOPY;  Service: Cardiovascular;  Laterality: N/A;    There were no vitals filed for this  visit.  Subjective Assessment - 02/21/19 1614    Subjective   My arm moves better in the water    Patient is accompanied by:  Family member   fiance   Pertinent History  L CVA on 09/14/18  Loop recorder, pt taking blood thinner    Patient Stated Goals  walk better and use my hand    Currently in Pain?  Yes    Pain Score  5     Pain Location  Shoulder    Pain Orientation  Right    Pain Descriptors / Indicators  Sore    Pain Type  Chronic pain    Pain Onset  More than a month ago    Pain Frequency  Intermittent    Aggravating Factors   certain movements    Pain Relieving Factors  It moves better in the water and I don't have pain       Patient seen for aquatic therapy today.  Treatment took place in water 2.5-4 feet deep depending upon activity.  Pt entered the pool via stairs using 1 hand railing and hand held support for RUE. Pt also needed assistance with R foot placement with descent however only needed one global cue for postural alignment.  Utilized functional ambulation with light dumb bells for UE support x2 lengths of the pool to allow pt to adjust to demands of the  water on alignment and postural control as well as impact on hemiplegic side. Pt demonstrating ability to adjust and accommodate more quickly to environmental demands.  Transitioned into supine using flotation devices and utilized Bad Ragazz techniques to address trunk, RUE and [name] rigidity and spasticity. Also addressed RUE ROM using aqua stretch techniques as well as body on arm movements - able to achieve 95* of abduction and 100* of shoulder flexion without pain today. Pt then able to participate in active ab/adduction in supine with min assistance and use body weight as light resistance for activity.  Pt needs cues to avoid hiking his shoulder and to relax head, neck and shoulders with activity. Pt also has great difficulty adjusting force of movement and requires cues to avoid over exertion but focus instead on generating  only what force is needed to complete the task. Water provides immediate feedback to assist with grading force.  Addressed LE ab/adduction in supine as well again with emphasis on normalizing force with movement to avoid increasing spasticity and rigidity.  Transitioned into standing with min assist.  Pt then positioned with back against pool wall and positioned into semi squat to encourage active control of LE's with activity. In this position addressed chest presses and horizontal ab/adduction using light dumb bells on top of the water to allow water to assist (10 reps x 2) -pt needs min - mod facilitation for this to avoid shoulder hiking and elbow flexion patterns with reach as well as for force modulation. Pt did demonstrate improved activation into these reach patterns of movement today and reported no pain at all in shoulder with all activities.  Addressed short distance open water ambulation without UE support today with emphasis on postural alignment and control. Pt needs mod cues and intermittent steadying assist. Pt will have access to pool at gym he currently belongs to - pt states he is "not sure if I will get in or not."  Discussed with pt benefits of water exercise as well as concerns for increased spasticity and rigidity if pt stops all water exercises. Offered to train caregiver.  Will develop HEP for pt for next session and again offer to train caregiver.                          OT Short Term Goals - 02/21/19 1615      OT SHORT TERM GOAL #1   Title  Pt and family will be mod I  with RUE positioning/care to address edema and pain in RUE - 11/23/2018 (goal adjusted as pt missed one week)    Status  Achieved      OT SHORT TERM GOAL #2   Title  Pt and family will be mod I with splint wear and care    Status  Achieved      OT SHORT TERM GOAL #3   Title  Pt and dtr will be mod I with HEP for RUE for ROM, addressing pain, functional use of RUE prn    Status  Achieved       OT SHORT TERM GOAL #4   Title  Pt will report pain in R hand/wrist no greater than 5/10 during HEP.      Status  Achieved      OT SHORT TERM GOAL #5   Title  Pt will demonstrate the ability for 55* of shoulder flexion as prep for functional low reach    Status  Not Met  45-50     OT SHORT TERM GOAL #6   Title  Pt will be mod I with toilet and tub bench transfers    Status  Achieved   met per pt report     OT SHORT TERM GOAL #7   Title  Pt will be mod I with bathing at shower level    Status  Achieved   met per pt report       OT Long Term Goals - 02/21/19 1616      OT LONG TERM GOAL #1   Title  Pt and family will be mod I with upgraded home activities program 03/14/2019  (date adjusted as pt missed one session due to health reasons and due to being on hold first two weeks of August due to scheduling)    Status  On-going      OT LONG TERM GOAL #2   Title  Pt will demonstrate ability to grasp and hold light weight object in R hand with pain 2/10 or less    Status  Achieved      OT LONG TERM GOAL #3   Title  Pt will demonstrate ability for bilateral low reach to obtain light weigh object     Status  On-going      OT LONG TERM GOAL #4   Title  Pt will be mod I with dressing    Status  Achieved      OT LONG TERM GOAL #5   Title  Pt will be mod I with cutting food, AE prn    Status  Achieved      OT LONG TERM GOAL #6   Title  Pt will demonstrate ability to use RUE as stabilizer at least 75% of the time in simple ADL tasks    Status  Partially Met   can use RUE as stabilizer with cues 50% of the time     OT LONG TERM GOAL #7   Title  Pt will be mod I with simple snack/be prep at ambulatory level    Status  Achieved      OT LONG TERM GOAL #8   Title  Pt will demonstrate improved postural alignment to decrease pain in RUE as well as decrease risk of falls    Status  On-going            Plan - 02/21/19 1618    Clinical Impression Statement  Pt with  improving RUE AAROM and decreasing pain during therapy session. Pt also with improved functonal mobility    OT Occupational Profile and History  Detailed Assessment- Review of Records and additional review of physical, cognitive, psychosocial history related to current functional performance    Occupational performance deficits (Please refer to evaluation for details):  ADL's;IADL's;Rest and Sleep;Work;Leisure;Social Participation    Body Structure / Function / Physical Skills  ADL;Balance;Decreased knowledge of use of DME;Coordination;Edema;GMC;FMC;Flexibility;IADL;Mobility;Pain;ROM;Strength;Tone;UE functional use;Endurance    Cognitive Skills  Safety Awareness    Rehab Potential  Good    Clinical Decision Making  Multiple treatment options, significant modification of task necessary    Comorbidities Affecting Occupational Performance:  Presence of comorbidities impacting occupational performance    Comorbidities impacting occupational performance description:  HTN, atypical chest pain, chronic R shoulder pain, OA of R shoulder, aphasia, apraxia, poor sensation    OT Frequency  1x / week    OT Duration  Other (comment)   5 weeks   OT Treatment/Interventions  Self-care/ADL training;Aquatic Therapy;Cryotherapy;Therapeutic exercise;Neuromuscular education;Energy conservation;DME  and/or AE instruction;Functional Mobility Training;Cognitive remediation/compensation;Balance training;Splinting;Manual Therapy;Passive range of motion;Therapeutic activities;Patient/family education    Plan aquatic therapy to address R shoulder pain prn, RUE reach and functional use, postural alligntment and mobility. D/c next session Provide aquatic HEP    Consulted and Agree with Plan of Care  Patient;Family member/caregiver    Family Member Consulted  fiance       Patient will benefit from skilled therapeutic intervention in order to improve the following deficits and impairments:   Body Structure / Function / Physical  Skills: ADL, Balance, Decreased knowledge of use of DME, Coordination, Edema, GMC, FMC, Flexibility, IADL, Mobility, Pain, ROM, Strength, Tone, UE functional use, Endurance Cognitive Skills: Safety Awareness     Visit Diagnosis: 1. Hemiplegia and hemiparesis following cerebral infarction affecting right dominant side (HCC)   2. Other symptoms and signs involving the nervous system   3. Unsteadiness on feet   4. Muscle weakness (generalized)   5. Acute pain of right shoulder   6. Abnormal posture   7. Other symptoms and signs involving cognitive functions following cerebral infarction   8. Chronic right shoulder pain       Problem List Patient Active Problem List   Diagnosis Date Noted  . Slow transit constipation   . Benign prostatic hyperplasia with urinary retention   . Spastic hemiparesis of right dominant side ([place])   . Chronic right shoulder pain   . Primary osteoarthritis of right shoulder   . Left basal ganglia embolic stroke ([place]) 39/06/2582  . CVA (cerebral vascular accident) ([place]) 08/31/2018  . Elevated aspartate aminotransferase level 06/16/2015  . H/O nutritional disorder 06/16/2015  . Excessive salivation 06/16/2015  . 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 Heart palpitations 06/16/2015  . 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 Screening for prostate cancer 08/21/2014  . Atypical chest pain 08/10/2013  . 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, [place], [address] Phone: [phone]   Fax:  [phone]  Name: Edward Glass MRN: [id number] Date of Birth: Feb 06, 1956

## 2019-02-23 ENCOUNTER — Other Ambulatory Visit: Payer: Self-pay | Admitting: Adult Health

## 2019-02-26 ENCOUNTER — Other Ambulatory Visit: Payer: Self-pay

## 2019-02-26 MED ORDER — FLUOXETINE HCL 20 MG PO CAPS: 20.0000 mg | ORAL_CAPSULE | Freq: Every day | ORAL | 3 refills | Status: DC

## 2019-02-26 NOTE — Progress Notes (Signed)
[facility] Summary Report / Loop Recorder 

## 2019-03-14 ENCOUNTER — Encounter: Payer: Self-pay | Admitting: Occupational Therapy

## 2019-03-14 ENCOUNTER — Other Ambulatory Visit: Payer: Self-pay

## 2019-03-14 ENCOUNTER — Ambulatory Visit: Payer: BC Managed Care – PPO | Attending: Physician Assistant | Admitting: Occupational Therapy

## 2019-03-14 DIAGNOSIS — R29818 Other symptoms and signs involving the nervous system: Secondary | ICD-10-CM

## 2019-03-14 DIAGNOSIS — M6281 Muscle weakness (generalized): Secondary | ICD-10-CM

## 2019-03-14 DIAGNOSIS — R2681 Unsteadiness on feet: Secondary | ICD-10-CM

## 2019-03-14 DIAGNOSIS — I69351 Hemiplegia and hemiparesis following cerebral infarction affecting right dominant side: Secondary | ICD-10-CM

## 2019-03-14 DIAGNOSIS — I69318 Other symptoms and signs involving cognitive functions following cerebral infarction: Secondary | ICD-10-CM | POA: Diagnosis present

## 2019-03-14 DIAGNOSIS — M25511 Pain in right shoulder: Secondary | ICD-10-CM

## 2019-03-14 DIAGNOSIS — R293 Abnormal posture: Secondary | ICD-10-CM | POA: Diagnosis present

## 2019-03-14 DIAGNOSIS — G8929 Other chronic pain: Secondary | ICD-10-CM | POA: Diagnosis present

## 2019-03-14 NOTE — Therapy (Signed)
[facility] [address] [place], [place], [address] Phone: [phone]   Fax:  [phone]  Occupational Therapy Treatment  Patient Details  Name: Edward Glass MRN: [id number] Date of Birth: 26-Jul-1956 Referring Provider (OT): Dr. Alysia Penna   Encounter Date: 03/14/2019  OT End of Session - 03/14/19 1903    Visit Number  29    Number of Visits  29    Date for OT Re-Evaluation  03/14/19    Authorization Type  [employer] - has 30 visits between OT and PT however if visits occur on the same day counts as one visit.  Pt today stated he wishes to continue with aquatic therapy and use his remaining 5 visits in the pool.    Authorization - Visit Number  30    Authorization - Number of Visits  30    OT Start Time  7353    OT Stop Time  1500    OT Time Calculation (min)  43 min    Activity Tolerance  Patient tolerated treatment well       Past Medical History:  Diagnosis Date  . Atypical chest pain 08/10/2013  . Elevated aspartate aminotransferase level 06/16/2015  . Excessive salivation 06/16/2015  . H/O nutritional disorder 06/16/2015  . 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 Screening for prostate cancer 08/21/2014  . 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 HERNIA REPAIR  2006  . left tricep surgery    . LOOP RECORDER INSERTION N/A 09/04/2018   Procedure: LOOP RECORDER INSERTION;  Surgeon: Evans Lance, MD;  Location: [facility];  Service: Cardiovascular;  Laterality: N/A;  . TEE WITHOUT CARDIOVERSION N/A 09/04/2018   Procedure: TRANSESOPHAGEAL ECHOCARDIOGRAM (TEE);  Surgeon: Jerline Pain, MD;  Location[facility] ENDOSCOPY;  Service: Cardiovascular;  Laterality: N/A;    There were no vitals filed for this visit.  Subjective Assessment - 03/14/19 1858    Subjective   I definitely got stiffer from being out of the pool for 2 weeks     Patient is accompanied by:  Family member   Dena fiance   Pertinent History  L CVA on 09/14/18  Loop recorder, pt taking blood thinner    Patient Stated Goals  walk better and use my hand    Currently in Pain?  Yes    Pain Score  4     Pain Location  Shoulder    Pain Orientation  Right    Pain Descriptors / Indicators  Aching;Sore    Pain Type  Chronic pain    Pain Onset  More than a month ago    Pain Frequency  Intermittent    Aggravating Factors   certain movements    Pain Relieving Factors  it moves better in the water       Patient seen for aquatic therapy today.  Treatment took place in water 2.5-4 feet deep depending upon activity.  Pt entered the pool via steps using one hand railing and hand held assist from therapist as well as assistance for R foot placement when descending stairs. Pt was initially reluctant in prior sessions to working toward aquatic HEP - pt does belong to a gym with a pool.  Today pt reported "I can tell I am much stiffer from being out of the pool the past two weeks." Pt open to aquatic HEP and feels daughter could assist.  With pt's  permission, videotaped HEP on pt's phone (fiance videotaped) and will send written HEP for daughter/pt/fiance as well.  HEP focused on functional ambulation, balance, spasticity mgmt, RUE PROM as well as RUE AROM. Pt able to participate in all activities with min a and vc's.  Pt and fiance verbalized understanding and daughter instructed to call with any questions. Pt has exhausted all therapy benefits for this year therefore will discharge pt today from OT however feel pt would benefit from continued services - pt to obtain order and return in January.  Pt tolerated all UE activities with 0/10 pain in the water. Pt to see physiatrist in 2-3 weeks for follow up appt and encouraged pt and fiance to discuss R shoulder pain at that time. Motor apraxia, spasticity and rigidity all contribute to shoulder pain - pt also uses posturing through  RUE/head/trunk to stabilize with standing and ambulation. Pt exited the pool via steps using 1 hand rail and min assist with [name] as power leg to ascend.                        OT Short Term Goals - 03/14/19 1859      OT SHORT TERM GOAL #1   Title  Pt and family will be mod I  with RUE positioning/care to address edema and pain in RUE - 11/23/2018 (goal adjusted as pt missed one week)    Status  Achieved      OT SHORT TERM GOAL #2   Title  Pt and family will be mod I with splint wear and care    Status  Achieved      OT SHORT TERM GOAL #3   Title  Pt and dtr will be mod I with HEP for RUE for ROM, addressing pain, functional use of RUE prn    Status  Achieved      OT SHORT TERM GOAL #4   Title  Pt will report pain in R hand/wrist no greater than 5/10 during HEP.      Status  Achieved      OT SHORT TERM GOAL #5   Title  Pt will demonstrate the ability for 55* of shoulder flexion as prep for functional low reach    Status  Not Met   45-50     OT SHORT TERM GOAL #6   Title  Pt will be mod I with toilet and tub bench transfers    Status  Achieved   met per pt report     OT SHORT TERM GOAL #7   Title  Pt will be mod I with bathing at shower level    Status  Achieved   met per pt report       OT Long Term Goals - 03/14/19 1900      OT LONG TERM GOAL #1   Title  Pt and family will be mod I with upgraded home activities program 03/14/2019  (date adjusted as pt missed one session due to health reasons and due to being on hold first two weeks of August due to scheduling)    Status  Achieved      OT LONG TERM GOAL #2   Title  Pt will demonstrate ability to grasp and hold light weight object in R hand with pain 2/10 or less    Status  Achieved      OT LONG TERM GOAL #3   Title  Pt will demonstrate ability for bilateral low reach  to obtain light weigh object     Status  Not Met      OT LONG TERM GOAL #4   Title  Pt will be mod I with dressing    Status   Achieved      OT LONG TERM GOAL #5   Title  Pt will be mod I with cutting food, AE prn    Status  Achieved      OT LONG TERM GOAL #6   Title  Pt will demonstrate ability to use RUE as stabilizer at least 75% of the time in simple ADL tasks    Status  Partially Met   can use RUE as stabilizer with cues 50% of the time     OT LONG TERM GOAL #7   Title  Pt will be mod I with simple snack/be prep at ambulatory level    Status  Achieved      OT LONG TERM GOAL #8   Title  Pt will demonstrate improved postural alignment to decrease pain in RUE as well as decrease risk of falls    Status  Partially Met            Plan - 03/14/19 1900    Clinical Impression Statement  Pt has met most goals - pt has limited therapy visits and has utilized all visits for this calendar year therefore will discharge pt.  Pt to obtain new order in January and return to therapy as pt could continue to benefit from skilled services.    OT Occupational Profile and History  Detailed Assessment- Review of Records and additional review of physical, cognitive, psychosocial history related to current functional performance    Occupational performance deficits (Please refer to evaluation for details):  ADL's;IADL's;Rest and Sleep;Work;Leisure;Social Participation    Body Structure / Function / Physical Skills  ADL;Balance;Decreased knowledge of use of DME;Coordination;Edema;GMC;FMC;Flexibility;IADL;Mobility;Pain;ROM;Strength;Tone;UE functional use;Endurance    Cognitive Skills  Safety Awareness    Rehab Potential  Good    Clinical Decision Making  Multiple treatment options, significant modification of task necessary    Comorbidities Affecting Occupational Performance:  Presence of comorbidities impacting occupational performance    Comorbidities impacting occupational performance description:  HTN, atypical chest pain, chronic R shoulder pain, OA of R shoulder, aphasia, apraxia, poor sensation    Modification or  Assistance to Complete Evaluation   Max significant modification of tasks or assist is necessary to complete    OT Frequency  1x / week    OT Duration  Other (comment)   5 weeks   OT Treatment/Interventions  Self-care/ADL training;Aquatic Therapy;Cryotherapy;Therapeutic exercise;Neuromuscular education;Energy conservation;DME and/or AE instruction;Functional Mobility Training;Cognitive remediation/compensation;Balance training;Splinting;Manual Therapy;Passive range of motion;Therapeutic activities;Patient/family education    Plan  d/c from OT services as pt has utilized all therapy visits from insurance at this time.    Consulted and Agree with Plan of Care  Patient;Family member/caregiver    Family Member Consulted  fiance       Patient will benefit from skilled therapeutic intervention in order to improve the following deficits and impairments:   Body Structure / Function / Physical Skills: ADL, Balance, Decreased knowledge of use of DME, Coordination, Edema, GMC, FMC, Flexibility, IADL, Mobility, Pain, ROM, Strength, Tone, UE functional use, Endurance Cognitive Skills: Safety Awareness     Visit Diagnosis: 1. Hemiplegia and hemiparesis following cerebral infarction affecting right dominant side (HCC)   2. Other symptoms and signs involving the nervous system   3. Unsteadiness on feet   4. Muscle  weakness (generalized)   5. Acute pain of right shoulder   6. Abnormal posture   7. Other symptoms and signs involving cognitive functions following cerebral infarction   8. Chronic right shoulder pain       Problem List Patient Active Problem List   Diagnosis Date Noted  . Slow transit constipation   . Benign prostatic hyperplasia with urinary retention   . Spastic hemiparesis of right dominant side ([facility])   . Chronic right shoulder pain   . Primary osteoarthritis of right shoulder   . Left basal ganglia embolic stroke ([facility]) 64/33/2951  . CVA (cerebral vascular accident) ([facility])  08/31/2018  . Elevated aspartate aminotransferase level 06/16/2015  . H/O nutritional disorder 06/16/2015  . Excessive salivation 06/16/2015  . 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 Heart palpitations 06/16/2015  . 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 Screening for prostate cancer 08/21/2014  . Atypical chest pain 08/10/2013  . 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  Patient goals were partially met. Patient is being discharged due to meeting the stated rehab goals.  ?????       Quay Burow, OTR/L 03/14/2019, 7:05 PM  [facility] [address] [place], [place], [address] Phone: [phone]   Fax:  [phone]  Name: Edward Glass MRN: [id number] Date of Birth: [birth year]

## 2019-03-21 ENCOUNTER — Ambulatory Visit (INDEPENDENT_AMBULATORY_CARE_PROVIDER_SITE_OTHER): Payer: BC Managed Care – PPO | Admitting: *Deleted

## 2019-03-21 DIAGNOSIS — I63512 Cerebral infarction due to unspecified occlusion or stenosis of left middle cerebral artery: Secondary | ICD-10-CM

## 2019-03-22 LAB — CUP PACEART REMOTE DEVICE CHECK
Date Time Interrogation Session: 20200827004044
Implantable Pulse Generator Implant Date: 20200210

## 2019-03-27 ENCOUNTER — Encounter: Payer: BC Managed Care – PPO | Attending: Registered Nurse | Admitting: Physical Medicine & Rehabilitation

## 2019-03-27 ENCOUNTER — Encounter: Payer: Self-pay | Admitting: Physical Medicine & Rehabilitation

## 2019-03-27 ENCOUNTER — Other Ambulatory Visit: Payer: Self-pay

## 2019-03-27 VITALS — BP 126/89 | HR 76 | Temp 97.9°F | Ht 65.0 in | Wt 169.8 lb

## 2019-03-27 DIAGNOSIS — E785 Hyperlipidemia, unspecified: Secondary | ICD-10-CM | POA: Insufficient documentation

## 2019-03-27 DIAGNOSIS — G8111 Spastic hemiplegia affecting right dominant side: Secondary | ICD-10-CM | POA: Diagnosis not present

## 2019-03-27 DIAGNOSIS — I639 Cerebral infarction, unspecified: Secondary | ICD-10-CM | POA: Insufficient documentation

## 2019-03-27 DIAGNOSIS — M7501 Adhesive capsulitis of right shoulder: Secondary | ICD-10-CM | POA: Diagnosis not present

## 2019-03-27 DIAGNOSIS — G8191 Hemiplegia, unspecified affecting right dominant side: Secondary | ICD-10-CM | POA: Insufficient documentation

## 2019-03-27 MED ORDER — DICLOFENAC SODIUM 1 % TD GEL: 2.0000 g | Freq: Four times a day (QID) | TRANSDERMAL | 5 refills | Status: DC

## 2019-03-27 NOTE — Progress Notes (Signed)
Subjective:    Patient ID: Edward Glass, male    DOB: 1956/06/06, 63 y.o.   MRN: [id number]  HPI   63 year old male with left basal ganglia, presumed embolic stroke.  Small PFO, has loop recorder.  Completed inpatient rehabilitation and is now finishing up with outpatient rehab mainly aquatic therapy at this point. He has a history of right glenohumeral osteoarthritis which is fairly severe.  He has had good relief with intra-articular injections of the glenohumeral joint on the right side last one in May 2020.  Right  Pec 100, 75U in clavicular head  Bicep 50 1+ EMG Pronator 50 2=EMG   Hamstring 100U  RIght shoulder pain anterior area No falls or trauma to the area.   Pain does not affect sleep but causes stiffness and reduce motion  Knee extensor spasms at night We also discussed his tone in recent botulinum toxin injection which was performed approximately 6 weeks ago Botox  Right  Pec 100, 75U in clavicular head  Bicep 50 1+ EMG Pronator 50 2=EMG  Hamstring 100U  Patient does not feel like his hamstring is as strong as it used to be and he still gets some spasms at night.  He now describes extensor spasms. He also does not feel like his shoulder pain is improved after the pectoralis injection. He also feels like his bicep is a little weaker.  He still has difficulty supinating his arm. Pain Inventory Average Pain 5 Pain Right Now 5 My pain is sharp  In the last 24 hours, has pain interfered with the following? General activity 5 Relation with others 0 Enjoyment of life 5 What TIME of day is your pain at its worst? varies Sleep (in general) Good  Pain is worse with: some activites Pain improves with: rest Relief from Meds: na  Mobility walk with assistance use a walker  Function disabled: date disabled . I need assistance with the following:  meal prep  Neuro/Psych bladder control problems weakness trouble walking  Prior Studies Any changes since  last visit?  no  Physicians involved in your care urologist   Family History  Problem Relation Age of Onset  . Kidney failure Mother   . Cancer Mother   . Prostate cancer Father   . Cancer Father   . Diabetes Father   . Colon cancer Neg Hx   . Esophageal cancer Neg Hx   . Rectal cancer Neg Hx   . Stomach cancer Neg Hx    Social History   Socioeconomic History  . Marital status: Married    Spouse name: Not on file  . Number of children: Not on file  . Years of education: Not on file  . Highest education level: Not on file  Occupational History  . Not on file  Social Needs  . Financial resource strain: Not hard at all  . Food insecurity    Worry: Never true    Inability: Never true  . Transportation needs    Medical: No    Non-medical: No  Tobacco Use  . Smoking status: Never Smoker  . Smokeless tobacco: Never Used  Substance and Sexual Activity  . Alcohol use: No  . Drug use: No  . Sexual activity: Not on file  Lifestyle  . Physical activity    Days per week: 6 days    Minutes per session: 30 min  . Stress: Not at all  Relationships  . Social Herbalist on phone: Three  times a week    Gets together: Three times a week    Attends religious service: Not on file    Active member of club or organization: Yes    Attends meetings of clubs or organizations: 1 to 4 times per year    Relationship status: Not on file  Other Topics Concern  . Not on file  Social History Narrative  . Not on file   Past Surgical History:  Procedure Laterality Date  . HERNIA REPAIR  2006  . left tricep surgery    . LOOP RECORDER INSERTION N/A 09/04/2018   Procedure: LOOP RECORDER INSERTION;  Surgeon: Evans Lance, MD;  Location: [facility];  Service: Cardiovascular;  Laterality: N/A;  . TEE WITHOUT CARDIOVERSION N/A 09/04/2018   Procedure: TRANSESOPHAGEAL ECHOCARDIOGRAM (TEE);  Surgeon: Jerline Pain, MD;  Location: [facility] ENDOSCOPY;  Service: Cardiovascular;   Laterality: N/A;   Past Medical History:  Diagnosis Date  . Atypical chest pain 08/10/2013  . Elevated aspartate aminotransferase level 06/16/2015  . Excessive salivation 06/16/2015  . H/O nutritional disorder 06/16/2015  . 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 Screening for prostate cancer 08/21/2014  . 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   BP 126/89   Pulse 76   Temp 97.9 F (36.6 C)   Ht 5\' 5"  (1.651 m)   Wt 169 lb 12.8 oz (77 kg)   SpO2 94%   BMI 28.26 kg/m   Opioid Risk Score:   Fall Risk Score:  `1  Depression screen PHQ 2/9  Depression screen [facility] 2/9 11/08/2018 10/10/2018  Decreased Interest 0 1  Down, Depressed, Hopeless 1 2  PHQ - 2 Score 1 3  Altered sleeping - 0  Tired, decreased energy - 0  Change in appetite - 0  Feeling bad or failure about yourself  - 1  Trouble concentrating - 0  Moving slowly or fidgety/restless - 0  Suicidal thoughts - 0  PHQ-9 Score - 4  Difficult doing work/chores - Not difficult at all     Review of Systems  Constitutional: Negative.   HENT: Negative.   Eyes: Negative.   Respiratory: Negative.   Cardiovascular: Negative.   Gastrointestinal: Negative.   Endocrine: Negative.   Genitourinary: Negative.   Musculoskeletal: Positive for gait problem.  Skin: Negative.   Allergic/Immunologic: Negative.   Neurological: Positive for weakness.  Hematological: Negative.   Psychiatric/Behavioral: Negative.   All other systems reviewed and are negative.      Objective:   Physical Exam Vitals signs and nursing note reviewed.  Constitutional:      Appearance: Normal appearance.  Eyes:     Extraocular Movements: Extraocular movements intact.     Pupils: Pupils are equal, round, and reactive to light.  Neurological:     General: No focal deficit present.     Mental Status: He is alert and oriented to person, place, and time.  Psychiatric:        Mood and Affect: Mood normal.        Behavior: Behavior normal.     Right upper extremity strength is 3- at the deltoid 3 at the biceps 3- triceps 3+ finger flexors and extensors Tone MAS 1 at the biceps MAS 1 at the pectoralis MAS 0 at the finger flexors and wrist flexors MAS 1 at the hamstring Mood and affect are appropriate Speech mildly dysarthric and hypophonic       Assessment & Plan:  1.Left CVA with Right hemiparesis, in terms  of his spasticity the main issue seems to be his pronator.  He does not like the mild weakness that he has with Botox injection in the hamstring and the biceps. Plan for next Botox in 6 weeks pronator teres 50 units, pronator quadratus 50 units  2.  RIght glenohumeral Shoulder injection RIght glenohumeral, last injection >35mo ago   Indication:RIght Shoulder pain not relieved by medication management and other conservative care.  Informed consent was obtained after describing risks and benefits of the procedure with the patient, this includes bleeding, bruising, infection and medication side effects. The patient wishes to proceed and has given written consent. Patient was placed in a seated position. The right shoulder was marked and prepped with betadine in the subacromial area. A 25-gauge 1-1/2 inch needle was inserted into the subacromial area. After negative draw back for blood, a solution containing 1 mL of 6 mg per ML betamethasone and 4 mL of 1% lidocaine was injected. A band aid was applied. The patient tolerated the procedure well. Post procedure instructions were given.

## 2019-03-29 ENCOUNTER — Telehealth: Payer: Self-pay | Admitting: *Deleted

## 2019-03-29 NOTE — Telephone Encounter (Signed)
Prior auth initiated for Voltaren Gel 1% via CoverMyMeds.

## 2019-03-30 NOTE — Progress Notes (Signed)
[facility] Summary Report / Loop Recorder 

## 2019-04-03 ENCOUNTER — Encounter: Payer: Self-pay | Admitting: Internal Medicine

## 2019-04-03 ENCOUNTER — Other Ambulatory Visit: Payer: Self-pay

## 2019-04-03 ENCOUNTER — Ambulatory Visit (INDEPENDENT_AMBULATORY_CARE_PROVIDER_SITE_OTHER): Payer: BC Managed Care – PPO | Admitting: Internal Medicine

## 2019-04-03 VITALS — BP 144/86 | HR 53 | Ht 65.0 in | Wt 167.0 lb

## 2019-04-03 DIAGNOSIS — I639 Cerebral infarction, unspecified: Secondary | ICD-10-CM

## 2019-04-03 NOTE — Patient Instructions (Signed)
Medication Instructions:  none If you need a refill on your cardiac medications before your next appointment, please call your pharmacy.   Lab work: none If you have labs (blood work) drawn today and your tests are completely normal, you will receive your results only by: . MyChart Message (if you have MyChart) OR . A paper copy in the mail If you have any lab test that is abnormal or we need to change your treatment, we will call you to review the results.  Testing/Procedures: none  Follow-Up: At [facility], you and your health needs are our priority.  As part of our continuing mission to provide you with exceptional heart care, we have created designated Provider Care Teams.  These Care Teams include your primary Cardiologist (physician) and Advanced Practice Providers (APPs -  Physician Assistants and Nurse Practitioners) who all work together to provide you with the care you need, when you need it. You will need a follow up appointment in 1 years.  Please call our office 2 months in advance to schedule this appointment.  You may see Dr Taylor or one of the following Advanced Practice Providers on your designated Care Team:   Amber Seiler, NP . Renee Ursuy, PA-C  Any Other Special Instructions Will Be Listed Below (If Applicable).    

## 2019-04-03 NOTE — Progress Notes (Signed)
HPI Edward Glass returns today for followup of a cryptogenic stroke. He is a pleasant 63 yo man with a cryptogenic stroke, s/p ILR insertion. He has been undergoing rehab. He has marked weakness in the right arm.  No Known Allergies   Current Outpatient Medications  Medication Sig Dispense Refill  . acetaminophen (TYLENOL) 325 MG tablet Take 2 tablets (650 mg total) by mouth every 4 (four) hours as needed for mild pain (or temp > 37.5 C (99.5 F)).    Marland Kitchen alfuzosin (UROXATRAL) 10 MG 24 hr tablet Take 1 tablet (10 mg total) by mouth 3 (three) times a week. 60 tablet 0  . aspirin EC 325 MG EC tablet Take 1 tablet (325 mg total) by mouth daily. 30 tablet 0  . atorvastatin (LIPITOR) 40 MG tablet Take 1 tablet (40 mg total) by mouth daily at 6 PM. 90 tablet 3  . clopidogrel (PLAVIX) 75 MG tablet Take 1 tablet (75 mg total) by mouth daily. 90 tablet 3  . diclofenac sodium (VOLTAREN) 1 % GEL Apply 2 g topically 4 (four) times daily. 100 g 5  . FLUoxetine (PROZAC) 20 MG capsule Take 1 capsule (20 mg total) by mouth daily. 30 capsule 3  . hydrocortisone (ANUSOL-HC) 25 MG suppository Place 1 suppository (25 mg total) rectally 2 (two) times daily. 12 suppository 0  . hydrocortisone-pramoxine (PROCTOFOAM-HC) rectal foam Place 1 applicator rectally 2 (two) times daily. 10 g 0  . multivitamin (ONE-A-DAY MEN'S) TABS tablet Take 1 tablet by mouth daily.    Marland Kitchen PARoxetine (PAXIL) 10 MG tablet Take 1 tablet by mouth daily.    . polyethylene glycol (MIRALAX / GLYCOLAX) packet Take 17 g by mouth 2 (two) times daily. 14 each 0  . senna-docusate (SENOKOT-S) 8.6-50 MG tablet Take 3 tablets by mouth 2 (two) times daily.    . tamsulosin (FLOMAX) 0.4 MG CAPS capsule Take 1 capsule by mouth daily.    Marland Kitchen tiZANidine (ZANAFLEX) 4 MG tablet Take 1 tablet (4 mg total) by mouth at bedtime. 30 tablet 1   No current facility-administered medications for this visit.      Past Medical History:  Diagnosis Date  . Atypical  chest pain 08/10/2013  . Elevated aspartate aminotransferase level 06/16/2015  . Excessive salivation 06/16/2015  . H/O nutritional disorder 06/16/2015  . 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 Screening for prostate cancer 08/21/2014  . 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   Past Surgical History:  Procedure Laterality Date  . HERNIA REPAIR  2006  . left tricep surgery    . LOOP RECORDER INSERTION N/A 09/04/2018   Procedure: LOOP RECORDER INSERTION;  Surgeon: Evans Lance, MD;  Location: [facility];  Service: Cardiovascular;  Laterality: N/A;  . TEE WITHOUT CARDIOVERSION N/A 09/04/2018   Procedure: TRANSESOPHAGEAL ECHOCARDIOGRAM (TEE);  Surgeon: Jerline Pain, MD;  Location: [facility] ENDOSCOPY;  Service: Cardiovascular;  Laterality: N/A;     Family History  Problem Relation Age of Onset  . Kidney failure Mother   . Cancer Mother   . Prostate cancer Father   . Cancer Father   . Diabetes Father   . Colon cancer Neg Hx   . Esophageal cancer Neg Hx   . Rectal cancer Neg Hx   . Stomach cancer Neg Hx      Social History   Socioeconomic History  .  Marital status: Married    Spouse name: Not on file  . Number of children: Not on file  . Years of education: Not on file  . Highest education level: Not on file  Occupational History  . Not on file  Social Needs  . Financial resource strain: Not hard at all  . Food insecurity    Worry: Never true    Inability: Never true  . Transportation needs    Medical: No    Non-medical: No  Tobacco Use  . Smoking status: Never Smoker  . Smokeless tobacco: Never Used  Substance and Sexual Activity  . Alcohol use: No  . Drug use: No  . Sexual activity: Not on file  Lifestyle  . Physical activity    Days per week: 6 days    Minutes per session: 30 min  . Stress: Not at all  Relationships  . Social Herbalist on phone: Three  times a week    Gets together: Three times a week    Attends religious service: Not on file    Active member of club or organization: Yes    Attends meetings of clubs or organizations: 1 to 4 times per year    Relationship status: Not on file  . Intimate partner violence    Fear of current or ex partner: No    Emotionally abused: No    Physically abused: No    Forced sexual activity: No  Other Topics Concern  . Not on file  Social History Narrative  . Not on file     BP (!) 144/86   Pulse (!) 53   Ht 5\' 5"  (1.651 m)   Wt 167 lb (75.8 kg)   SpO2 97%   BMI 27.79 kg/m   Physical Exam:  Well appearing NAD HEENT: Unremarkable Neck:  No JVD, no thyromegally Lymphatics:  No adenopathy Back:  No CVA tenderness Lungs:  Clear with no wheezes HEART:  Regular rate rhythm, no murmurs, no rubs, no clicks Abd:  soft, positive bowel sounds, no organomegally, no rebound, no guarding Ext:  2 plus pulses, no edema, no cyanosis, no clubbing Skin:  No rashes no nodules Neuro:  CN II through XII intact, motor grossly intact   DEVICE  Normal device function.  See PaceArt for details. No atrial fib  Assess/Plan: 1. Cryptogenic stroke - he has not had any atrial fib.  2. NSVT - he was asymptomatic and has only had a single episode.  3. Dyslipidemia - he will continue his statin therapy.  Mikle Bosworth.D.

## 2019-04-03 NOTE — Telephone Encounter (Addendum)
Prior auth for Voltaren Gel 1% was denied by [employer]. Dr Letta Pate given the denial fax. I called and left a message per DPR for Edward Glass and informed him it can be bought OTC at his pharmacy or [facility] etc.

## 2019-04-23 ENCOUNTER — Ambulatory Visit (INDEPENDENT_AMBULATORY_CARE_PROVIDER_SITE_OTHER): Payer: BC Managed Care – PPO | Admitting: *Deleted

## 2019-04-23 DIAGNOSIS — I63512 Cerebral infarction due to unspecified occlusion or stenosis of left middle cerebral artery: Secondary | ICD-10-CM | POA: Diagnosis not present

## 2019-04-24 LAB — CUP PACEART REMOTE DEVICE CHECK
Date Time Interrogation Session: 20200929184514
Implantable Pulse Generator Implant Date: 20200210

## 2019-05-02 NOTE — Progress Notes (Signed)
[facility] Summary Report / Loop Recorder 

## 2019-05-16 ENCOUNTER — Encounter: Payer: BC Managed Care – PPO | Admitting: Physical Medicine & Rehabilitation

## 2019-05-18 ENCOUNTER — Ambulatory Visit: Payer: BC Managed Care – PPO | Admitting: Physical Medicine & Rehabilitation

## 2019-05-27 LAB — CUP PACEART REMOTE DEVICE CHECK
Date Time Interrogation Session: 20201101184456
Implantable Pulse Generator Implant Date: 20200210

## 2019-05-28 ENCOUNTER — Ambulatory Visit (INDEPENDENT_AMBULATORY_CARE_PROVIDER_SITE_OTHER): Payer: BC Managed Care – PPO | Admitting: *Deleted

## 2019-05-28 DIAGNOSIS — I63512 Cerebral infarction due to unspecified occlusion or stenosis of left middle cerebral artery: Secondary | ICD-10-CM

## 2019-05-31 ENCOUNTER — Ambulatory Visit: Payer: Self-pay | Admitting: Adult Health

## 2019-06-05 ENCOUNTER — Encounter: Payer: BC Managed Care – PPO | Attending: Registered Nurse | Admitting: Physical Medicine & Rehabilitation

## 2019-06-05 ENCOUNTER — Other Ambulatory Visit: Payer: Self-pay

## 2019-06-05 ENCOUNTER — Encounter: Payer: Self-pay | Admitting: Physical Medicine & Rehabilitation

## 2019-06-05 VITALS — BP 139/79 | HR 67 | Temp 97.7°F | Ht 65.0 in | Wt 169.0 lb

## 2019-06-05 DIAGNOSIS — E785 Hyperlipidemia, unspecified: Secondary | ICD-10-CM | POA: Insufficient documentation

## 2019-06-05 DIAGNOSIS — G8191 Hemiplegia, unspecified affecting right dominant side: Secondary | ICD-10-CM | POA: Insufficient documentation

## 2019-06-05 DIAGNOSIS — I639 Cerebral infarction, unspecified: Secondary | ICD-10-CM | POA: Diagnosis present

## 2019-06-05 DIAGNOSIS — G8111 Spastic hemiplegia affecting right dominant side: Secondary | ICD-10-CM

## 2019-06-05 NOTE — Progress Notes (Signed)
Botox Injection for spasticity using needle EMG guidance  Dilution: 50 Units/ml Indication: Severe spasticity which interferes with ADL,mobility and/or  hygiene and is unresponsive to medication management and other conservative care Informed consent was obtained after describing risks and benefits of the procedure with the patient. This includes bleeding, bruising, infection, excessive weakness, or medication side effects. A REMS form is on file and signed. Needle: 27g 1" needle electrode Number of units per muscle Pronator teres 50U Pronator Quadratus 50U All injections were done after obtaining appropriate EMG activity and after negative drawback for blood. The patient tolerated the procedure well. Post procedure instructions were given. A followup appointment was made.

## 2019-06-05 NOTE — Patient Instructions (Signed)

## 2019-06-24 ENCOUNTER — Other Ambulatory Visit: Payer: Self-pay | Admitting: Adult Health

## 2019-06-25 NOTE — Progress Notes (Signed)
[facility] Summary Report / Loop Recorder 

## 2019-06-27 ENCOUNTER — Other Ambulatory Visit: Payer: Self-pay | Admitting: Adult Health

## 2019-06-29 ENCOUNTER — Ambulatory Visit (INDEPENDENT_AMBULATORY_CARE_PROVIDER_SITE_OTHER): Payer: BC Managed Care – PPO | Admitting: *Deleted

## 2019-06-29 DIAGNOSIS — I63512 Cerebral infarction due to unspecified occlusion or stenosis of left middle cerebral artery: Secondary | ICD-10-CM | POA: Diagnosis not present

## 2019-06-30 LAB — CUP PACEART REMOTE DEVICE CHECK
Date Time Interrogation Session: 20201204134659
Implantable Pulse Generator Implant Date: 20200210

## 2019-07-17 ENCOUNTER — Other Ambulatory Visit: Payer: Self-pay

## 2019-07-17 ENCOUNTER — Encounter: Payer: BC Managed Care – PPO | Attending: Registered Nurse | Admitting: Registered Nurse

## 2019-07-17 VITALS — BP 143/85 | HR 68 | Temp 97.5°F | Ht 65.0 in | Wt 176.8 lb

## 2019-07-17 DIAGNOSIS — I639 Cerebral infarction, unspecified: Secondary | ICD-10-CM | POA: Insufficient documentation

## 2019-07-17 DIAGNOSIS — E785 Hyperlipidemia, unspecified: Secondary | ICD-10-CM | POA: Insufficient documentation

## 2019-07-17 DIAGNOSIS — G8191 Hemiplegia, unspecified affecting right dominant side: Secondary | ICD-10-CM | POA: Diagnosis present

## 2019-07-17 DIAGNOSIS — M7501 Adhesive capsulitis of right shoulder: Secondary | ICD-10-CM | POA: Diagnosis not present

## 2019-07-17 DIAGNOSIS — G8111 Spastic hemiplegia affecting right dominant side: Secondary | ICD-10-CM

## 2019-07-17 NOTE — Progress Notes (Signed)
Subjective:    Patient ID: Edward Glass, male    DOB: [birth year], 63 y.o.   MRN: [id number]  HPI: Edward Glass is a 63 y.o. male who returns for follow up appointment for left basal ganglia embolic stroke,spastic hemiparesis of right dominant side and right shoulder pain. He states his pain is located in his right shoulder. He rates his  Pain 3. His current exercise regime is walking and performing stretching exercises.  Mr. Laure Kidney states he's unable to return for follow up appointments due to financial hardship, he was instructed to call office and schedule F/U appointment with Dr. Letta Pate when he is able. He verbalizes understanding.   Pain Inventory Average Pain 3 Pain Right Now 3 My pain is dull  In the last 24 hours, has pain interfered with the following? General activity 3 Relation with others 3 Enjoyment of life 3 What TIME of day is your pain at its worst? morning Sleep (in general) Good  Pain is worse with: unsure and some activites Pain improves with: injections Relief from Meds: na  Mobility use a walker ability to climb steps?  yes do you drive?  no  Function disabled: date disabled . I need assistance with the following:  meal prep  Neuro/Psych trouble walking  Prior Studies Any changes since last visit?  no  Physicians involved in your care Urologist   Family History  Problem Relation Age of Onset  . Kidney failure Mother   . Cancer Mother   . Prostate cancer Father   . Cancer Father   . Diabetes Father   . Colon cancer Neg Hx   . Esophageal cancer Neg Hx   . Rectal cancer Neg Hx   . Stomach cancer Neg Hx    Social History   Socioeconomic History  . Marital status: Married    Spouse name: Not on file  . Number of children: Not on file  . Years of education: Not on file  . Highest education level: Not on file  Occupational History  . Not on file  Tobacco Use  . Smoking status: Never Smoker  . Smokeless tobacco: Never Used    Substance and Sexual Activity  . Alcohol use: No  . Drug use: No  . Sexual activity: Not on file  Other Topics Concern  . Not on file  Social History Narrative  . Not on file   Social Determinants of Health   Financial Resource Strain: Low Risk   . Difficulty of Paying Living Expenses: Not hard at all  Food Insecurity: No Food Insecurity  . Worried About Charity fundraiser in the Last Year: Never true  . Ran Out of Food in the Last Year: Never true  Transportation Needs: No Transportation Needs  . Lack of Transportation (Medical): No  . Lack of Transportation (Non-Medical): No  Physical Activity: Sufficiently Active  . Days of Exercise per Week: 6 days  . Minutes of Exercise per Session: 30 min  Stress: No Stress Concern Present  . Feeling of Stress : Not at all  Social Connections: Unknown  . Frequency of Communication with Friends and Family: Three times a week  . Frequency of Social Gatherings with Friends and Family: Three times a week  . Attends Religious Services: Not on file  . Active Member of Clubs or Organizations: Yes  . Attends Archivist Meetings: 1 to 4 times per year  . Marital Status: Not on file   Past Surgical History:  Procedure Laterality Date  . HERNIA REPAIR  2006  . left tricep surgery    . LOOP RECORDER INSERTION N/A 09/04/2018   Procedure: LOOP RECORDER INSERTION;  Surgeon: Evans Lance, MD;  Location: [facility];  Service: Cardiovascular;  Laterality: N/A;  . TEE WITHOUT CARDIOVERSION N/A 09/04/2018   Procedure: TRANSESOPHAGEAL ECHOCARDIOGRAM (TEE);  Surgeon: Jerline Pain, MD;  Location: [facility] ENDOSCOPY;  Service: Cardiovascular;  Laterality: N/A;   Past Medical History:  Diagnosis Date  . Atypical chest pain 08/10/2013  . Elevated aspartate aminotransferase level 06/16/2015  . Excessive salivation 06/16/2015  . H/O nutritional disorder 06/16/2015  . 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 Screening for  prostate cancer 08/21/2014  . 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   BP (!) 143/85   Pulse 68   Temp (!) 97.5 F (36.4 C)   Ht 5\' 5"  (1.651 m)   Wt 176 lb 12.8 oz (80.2 kg)   SpO2 96%   BMI 29.42 kg/m   Opioid Risk Score:   Fall Risk Score:  `1  Depression screen PHQ 2/9  Depression screen [facility] 2/9 11/08/2018 10/10/2018  Decreased Interest 0 1  Down, Depressed, Hopeless 1 2  PHQ - 2 Score 1 3  Altered sleeping - 0  Tired, decreased energy - 0  Change in appetite - 0  Feeling bad or failure about yourself  - 1  Trouble concentrating - 0  Moving slowly or fidgety/restless - 0  Suicidal thoughts - 0  PHQ-9 Score - 4  Difficult doing work/chores - Not difficult at all   Review of Systems     Objective:   Physical Exam Vitals and nursing note reviewed.  Constitutional:      Appearance: Normal appearance.  Cardiovascular:     Rate and Rhythm: Normal rate and regular rhythm.     Pulses: Normal pulses.     Heart sounds: Normal heart sounds.  Pulmonary:     Effort: Pulmonary effort is normal.     Breath sounds: Normal breath sounds.  Musculoskeletal:     Cervical back: Normal range of motion and neck supple.     Comments: Normal Muscle Bulk and Muscle Testing Reveals:  Upper Extremities: Right: Decreased ROM 30 Degrees and Muscle Strength 4/5 Left: Full ROM and Muscle Strength 5/5 Lower Extremities: Right: Decreased ROM and Muscle Strength 5/5 wearing Right AFO Left: Full ROM and Muscle Strength 5/5 Arises from chair slowly using walker for support  Wide Based Gait   Skin:    General: Skin is warm and dry.  Neurological:     Mental Status: He is alert and oriented to person, place, and time.  Psychiatric:        Mood and Affect: Mood normal.        Behavior: Behavior normal.           Assessment & Plan:  1.Left basal ganglia embolic stroke/spastic hemiparesis of right dominant side: S/P Botox on 06/05/2019, with good results noted. Continue to  Monitor. 2.Adhesive Bursitis of Right Shoulder/  right shoulder pain: Continue to Alternate with Ice and Heat Therapy. Continue to Monitor.   15 minutes of face to face patient care time was spent during this visit. All questions were encouraged and answered.

## 2019-07-26 ENCOUNTER — Encounter: Payer: Self-pay | Admitting: Registered Nurse

## 2019-07-31 IMAGING — CR DG WRIST 2V*R*
2 series · 2 of 2 positions shown · non-contrast
Comparison: None.

CLINICAL DATA: 62-year-old male with a history of right wrist pain
for 4 weeks

EXAM:
RIGHT WRIST - 2 VIEW

[wrist pa]
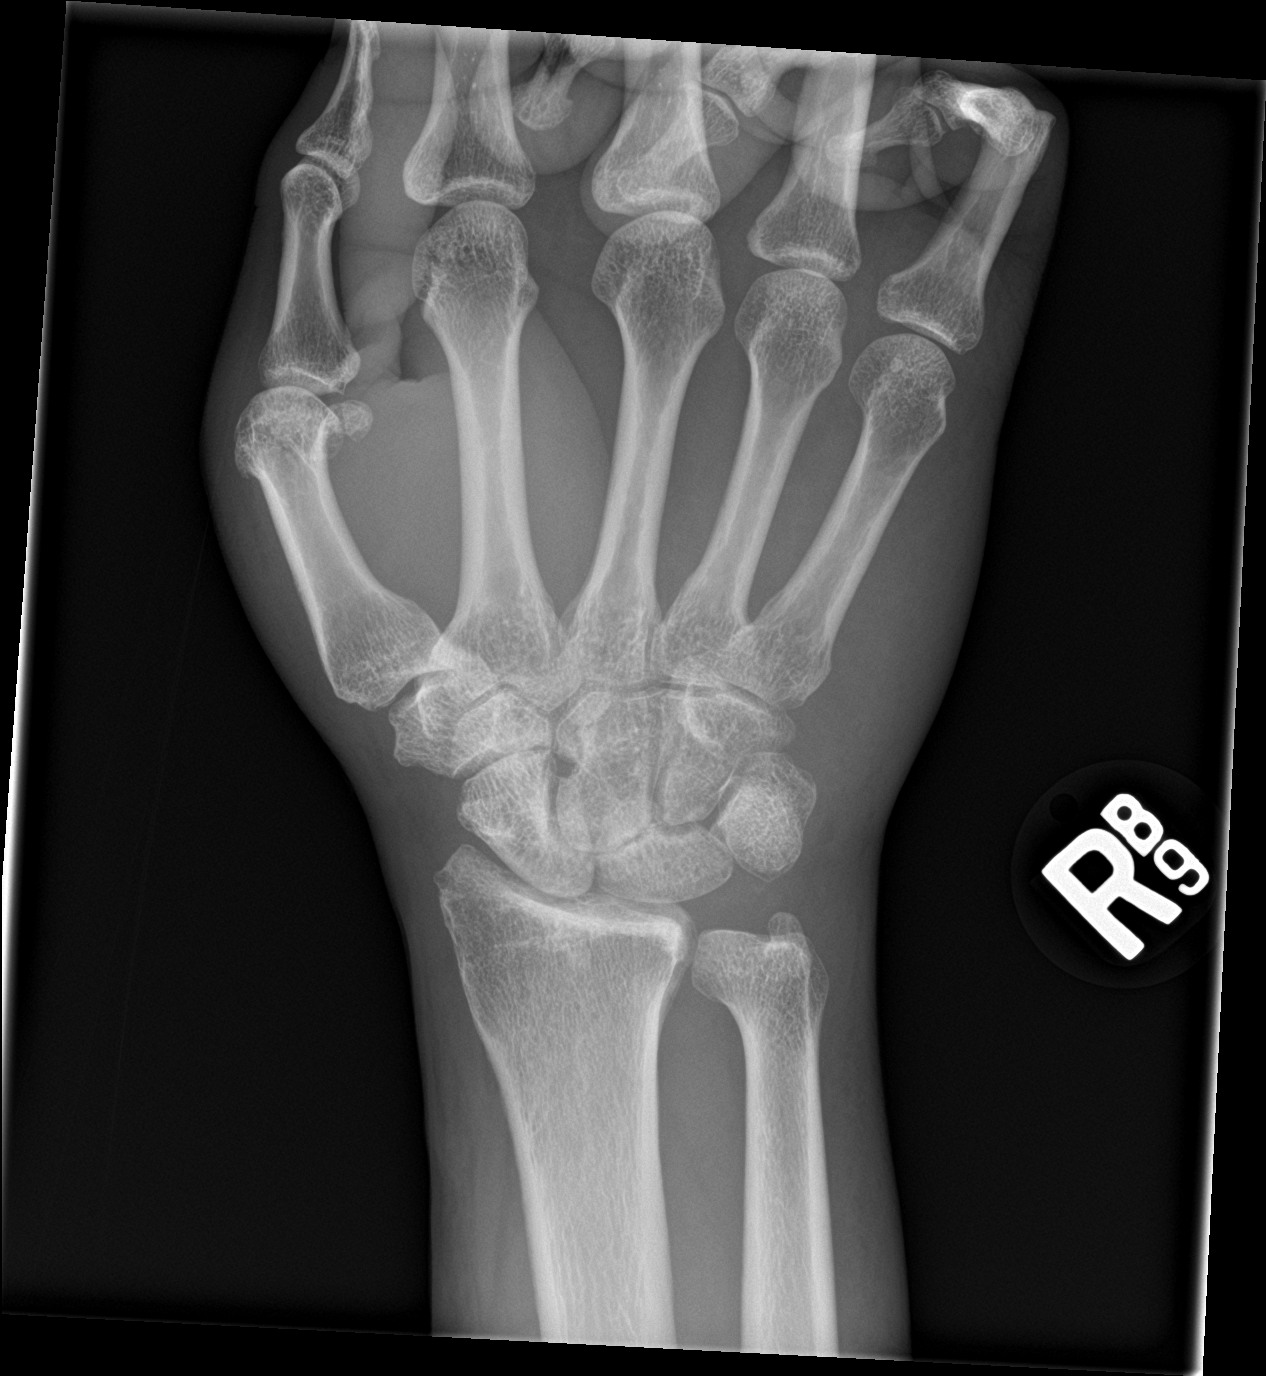

[wrist lat]
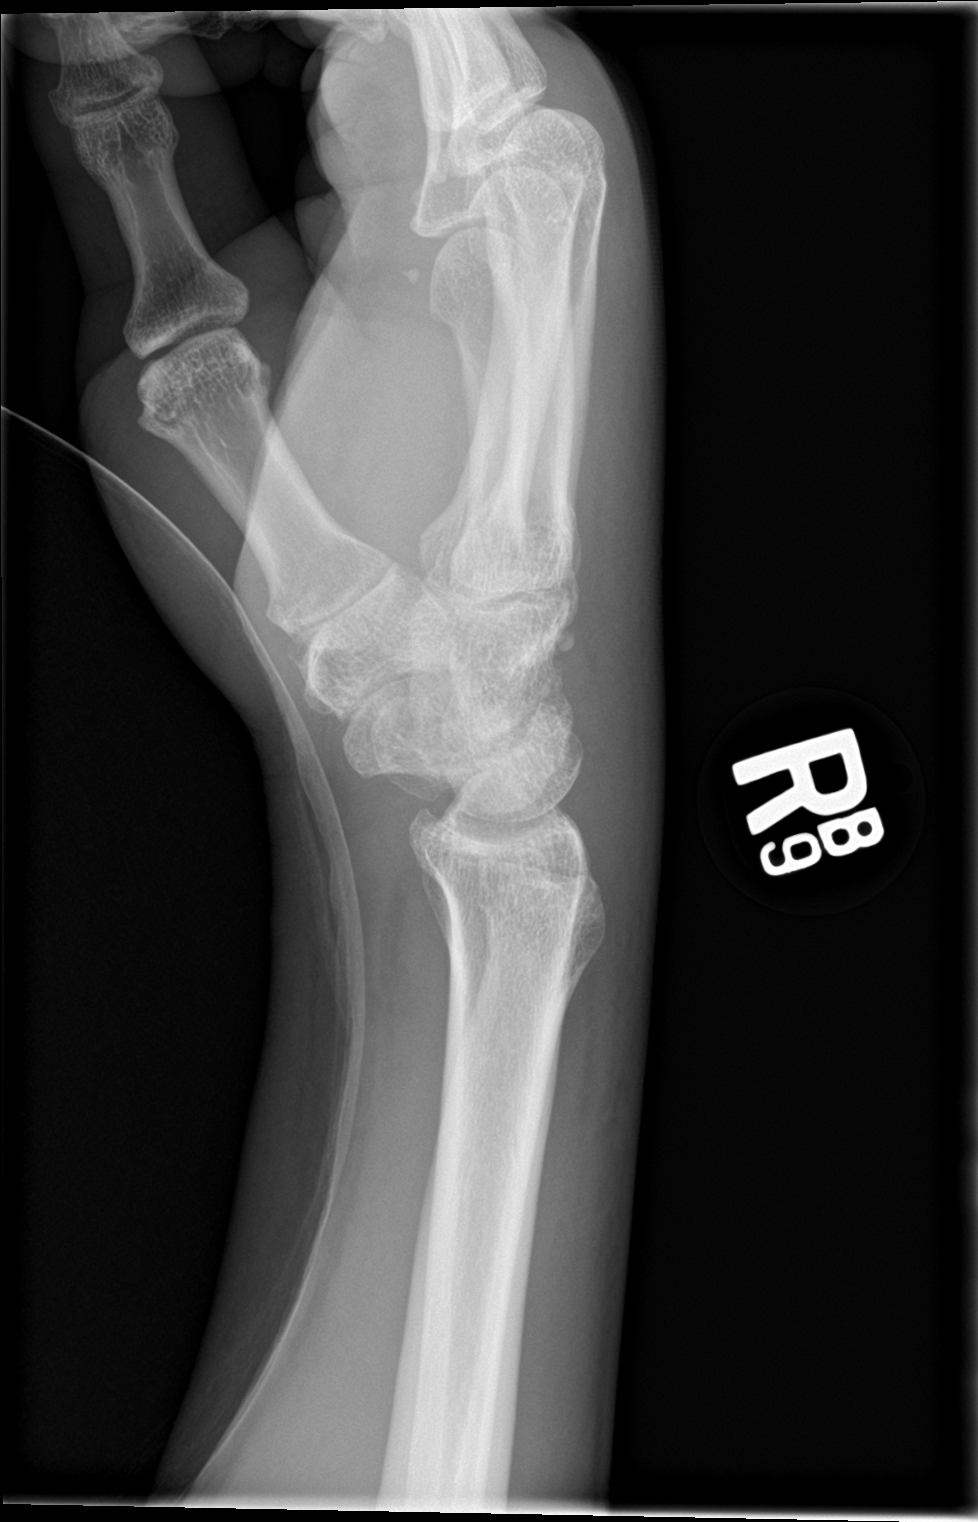

[2 of 2 positions shown; findings below may reference images not displayed]

FINDINGS: No acute displaced fracture. Degenerative changes. No focal soft
tissue swelling. No radiopaque foreign body.
IMPRESSION: No acute bony abnormality.

Degenerative changes of the carpal bones.

## 2019-08-01 ENCOUNTER — Ambulatory Visit (INDEPENDENT_AMBULATORY_CARE_PROVIDER_SITE_OTHER): Payer: BC Managed Care – PPO | Admitting: *Deleted

## 2019-08-01 DIAGNOSIS — I63512 Cerebral infarction due to unspecified occlusion or stenosis of left middle cerebral artery: Secondary | ICD-10-CM | POA: Diagnosis not present

## 2019-08-01 LAB — CUP PACEART REMOTE DEVICE CHECK
Date Time Interrogation Session: 20210106135947
Implantable Pulse Generator Implant Date: 20200210

## 2019-08-30 ENCOUNTER — Other Ambulatory Visit: Payer: Self-pay | Admitting: Adult Health

## 2019-09-03 ENCOUNTER — Other Ambulatory Visit: Payer: Self-pay | Admitting: Adult Health

## 2019-09-03 ENCOUNTER — Ambulatory Visit (INDEPENDENT_AMBULATORY_CARE_PROVIDER_SITE_OTHER): Payer: BC Managed Care – PPO | Admitting: *Deleted

## 2019-09-03 DIAGNOSIS — I63512 Cerebral infarction due to unspecified occlusion or stenosis of left middle cerebral artery: Secondary | ICD-10-CM | POA: Diagnosis not present

## 2019-09-03 LAB — CUP PACEART REMOTE DEVICE CHECK
Date Time Interrogation Session: 20210207235029
Implantable Pulse Generator Implant Date: 20200210

## 2019-09-04 ENCOUNTER — Other Ambulatory Visit: Payer: Self-pay | Admitting: Adult Health

## 2019-09-04 NOTE — Progress Notes (Signed)
ILR Remote 

## 2019-09-05 ENCOUNTER — Telehealth: Payer: Self-pay

## 2019-09-05 NOTE — Telephone Encounter (Signed)
err

## 2019-09-06 ENCOUNTER — Other Ambulatory Visit: Payer: Self-pay

## 2019-09-06 ENCOUNTER — Encounter: Payer: Self-pay | Admitting: Adult Health

## 2019-09-06 ENCOUNTER — Ambulatory Visit: Payer: BC Managed Care – PPO | Admitting: Adult Health

## 2019-09-06 VITALS — BP 116/78 | HR 64 | Temp 97.5°F | Ht 65.0 in | Wt 174.6 lb

## 2019-09-06 DIAGNOSIS — G8111 Spastic hemiplegia affecting right dominant side: Secondary | ICD-10-CM

## 2019-09-06 DIAGNOSIS — E785 Hyperlipidemia, unspecified: Secondary | ICD-10-CM | POA: Diagnosis not present

## 2019-09-06 DIAGNOSIS — I63512 Cerebral infarction due to unspecified occlusion or stenosis of left middle cerebral artery: Secondary | ICD-10-CM | POA: Diagnosis not present

## 2019-09-06 DIAGNOSIS — I10 Essential (primary) hypertension: Secondary | ICD-10-CM

## 2019-09-06 DIAGNOSIS — F0631 Mood disorder due to known physiological condition with depressive features: Secondary | ICD-10-CM

## 2019-09-06 DIAGNOSIS — I69398 Other sequelae of cerebral infarction: Secondary | ICD-10-CM

## 2019-09-06 MED ORDER — BACLOFEN 5 MG PO TABS: 5.0000 mg | ORAL_TABLET | Freq: Two times a day (BID) | ORAL | 3 refills | Status: DC | PRN

## 2019-09-06 MED ORDER — FLUOXETINE HCL 20 MG PO CAPS: 20.0000 mg | ORAL_CAPSULE | Freq: Every day | ORAL | 1 refills | Status: DC

## 2019-09-06 NOTE — Progress Notes (Signed)
[facility] [address] [place]. [place] [address] [phone]       STROKE FOLLOW UP NOTE  Edward Glass Date of Birth:  1955-09-13 Medical Record Number:  [id number]   Reason for Referral: Cryptogenic stroke follow up [place] provider: Dr. Leonie Man PCP: Katherina Mires, MD     CHIEF COMPLAINT:  Chief Complaint  Patient presents with  . Follow-up    Alone. Rm 9. Patient mentioned that having some stiffness and muscle spaams in his right leg and shoulder.     HPI:  Update 09/06/2019: Edward Glass is a 64 year old male who is being seen today for cryptogenic stroke follow-up.  He was previously seen via virtual visit on 11/08/2018 and has not previously followed up as recommended.  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  He continues to ambulate with a Rollator walker and denies any recent falls.  Greatest concern today is including continued right-sided spasticity.  Trial of Botox injections but denies benefit as well as trial of Zanaflex but patient self discontinued due to reported side effect of upset stomach.  Completed 3 months DAPT and continues on Plavix alone without bleeding or bruising.  Continues on atorvastatin without myalgias.  Blood pressure today 116/78.  Continues to follow with PCP regularly for HTN and HLD management/monitoring.  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  Loop recorder has not shown atrial fibrillation thus far.  Denies new or worsening stroke/TIA symptoms.    History copied from records purposes only Stroke admission 08/30/2018: Edward Glass is a 64 year old male who presented to [facility] ED with right-sided weakness and aphasia.   CT head reviewed which showed age indeterminate small vessel ischemia in the left BG.  CTA showed possible left M1 occlusion versus severe stenosis.  CT perfusion no infarct for but large penumbra.  MRI  brain reviewed and  showed left MCA patchy infarcts mostly concentrated in the left BG. He arrived greater than 24h from time since last well, therefore no acute stroke interventions done.  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  He unfortunately had neurological worsening with severe aphasia and dense right hemiplegia with extension of deficits on 09/01/2018.  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   When pts dtrs arrive, they both explain that in 2016 after tricep repair surgery they noted a possible arrhythmia and placed him on holter monitor for 30d, but was unrevealing.  2D echo showed an EF of 55 to 60%.  TCD bubble study negative for PFO.  Lower extremity venous Dopplers negative for DVT.  TEE showed small PFO/bubble crossover noted during Valsalva which was not felt to be clinically significant along with no evidence of cardiac thrombus therefore loop recorder placed.   Recommended DAPT for 3 months then Plavix alone due to severe left M1 stenosis.  HTN stable recommended long-term BP goal 1 30-1 50 due left M1 stenosis.  HLD 104 and initiated atorvastatin 40 mg daily.  Other stroke risk factors include family history of stroke but no personal history of stroke.  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  Initiated Prozac on 08/31/2018.  He had residual deficits of right hemiparesis, dysarthria and mixed aphasia and was discharged to CIR for  ongoing therapy.  Initial visit via virtual 11/08/2018: He has been stable from a stroke standpoint with residual deficits of aphasia, cognitive deficits and right hemiparesis but does endorse improvement.  He continues to participate at neuro rehab PT/OT/ST along with continuing exercises at home.  He is currently  ambulating with a quad cane and denies any recent falls.  He does need some assistance with bathing and dressing but continues to be able to do more on his own each day.  He no longer has any difficulties with swallowing and is currently on a regular diet.  He does endorse increased salivation.  He continues on aspirin and Plavix without side effects of bleeding or bruising.  Continues on atorvastatin without side effects myalgias.  Blood pressures not routinely monitored at home and encourage daughter and patient to obtain cough to start monitoring.  He does endorse occasional depression difficulties such as feeling down or increased anxiety.  It was recommended to initiate Prozac during hospital admission but states once he went home, he did not continue as he felt it was not needed.  Per review of loop recorder, no report of atrial fibrillation found but per patient and daughter, they were contacted by cardiology stating arrhythmia had been found and is in the process of scheduling visit with cardiology.  No further concerns at this time.  Denies new or worsening stroke/TIA symptoms.     ROS:   14 system review of systems performed and negative with exception of speech difficulty, weakness, muscle spasticity  PMH:  Past Medical History:  Diagnosis Date  . Atypical chest pain 08/10/2013  . Elevated aspartate aminotransferase level 06/16/2015  . Excessive salivation 06/16/2015  . H/O nutritional disorder 06/16/2015  . 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 Screening for prostate cancer 08/21/2014  . 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 HERNIA REPAIR  2006  . left tricep surgery    . LOOP RECORDER INSERTION N/A 09/04/2018   Procedure: LOOP RECORDER INSERTION;  Surgeon: Evans Lance, MD;  Location: [facility];  Service: Cardiovascular;  Laterality: N/A;  . TEE WITHOUT CARDIOVERSION N/A 09/04/2018    Procedure: TRANSESOPHAGEAL ECHOCARDIOGRAM (TEE);  Surgeon: Jerline Pain, MD;  Location: [facility] END[facility]COPY;  Service: Cardiovascular;  Laterality: N/A;    Social History:  Social History   Socioeconomic History  . Marital status: Married    Spouse name: Not on file  . Number of children: Not on file  . Years of education: Not on file  . Highest education level: Not on file  Occupational History  . Not on file  Tobacco Use  . Smoking status: Never Smoker  . Smokeless tobacco: Never Used  Substance and Sexual Activity  . Alcohol use: No  . Drug use: No  . Sexual activity: Not on file  Other Topics Concern  . Not on file  Social History Narrative  . Not on file   Social Determinants of Health   Financial Resource Strain: Low Risk   . Difficulty of Paying Living Expenses: Not on file  Food Insecurity: No Food Insecurity  . Worried About Charity fundraiser in the Last Year: Not on file  . Ran Out of Food in the Last Year: Not on file  Transportation Needs: No Transportation Needs  . Lack of Transportation (Medical): Not on file  . Lack of Transportation (Non-Medical): Not on file  Physical Activity: Sufficiently  Active  . Days of Exercise per Week: Not on file  . Minutes of Exercise per Session: Not on file  Stress: No Stress Concern Present  . Feeling of Stress : Not on file  Social Connections: Unknown  . Frequency of Communication with Friends and Family: Not on file  . Frequency of Social Gatherings with Friends and Family: Not on file  . Attends Religious Services: Not on file  . Active Member of Clubs or Organizations: Not on file  . Attends Archivist Meetings: Not on file  . Marital Status: Not on file  Intimate Partner Violence: Not At Risk  . Fear of Current or Ex-Partner: Not on file  . Emotionally Abused: Not on file  . Physically Abused: Not on file  . Sexually Abused: Not on file    Family History:  Family History  Problem Relation Age of Onset   . Kidney failure Mother   . Cancer Mother   . Prostate cancer Father   . Cancer Father   . Diabetes Father   . Colon cancer Neg Hx   . Esophageal cancer Neg Hx   . Rectal cancer Neg Hx   . Stomach cancer Neg Hx     Medications:   Current Outpatient Medications on File Prior to Visit  Medication Sig Dispense Refill  . acetaminophen (TYLENOL) 325 MG tablet Take 2 tablets (650 mg total) by mouth every 4 (four) hours as needed for mild pain (or temp > 37.5 C (99.5 F)). (Patient taking differently: Take 650 mg by mouth as needed for mild pain (or temp > 37.5 C (99.5 F)). )    . atorvastatin (LIPITOR) 40 MG tablet Take 1 tablet (40 mg total) by mouth daily at 6 PM. 90 tablet 3  . clopidogrel (PLAVIX) 75 MG tablet Take 1 tablet (75 mg total) by mouth daily. 90 tablet 3  . diclofenac sodium (VOLTAREN) 1 % GEL Apply 2 g topically 4 (four) times daily. (Patient taking differently: Apply 2 g topically as needed. ) 100 g 5  . multivitamin (ONE-A-DAY MEN'S) TABS tablet Take 1 tablet by mouth daily.     No current facility-administered medications on file prior to visit.    Allergies:  No Known Allergies   Physical Exam  Vitals:   09/06/19 1105  BP: 116/78  Pulse: 64  Temp: (!) 97.5 F (36.4 C)  TempSrc: Oral  Weight: 174 lb 9.6 oz (79.2 kg)  Height: 5\' 5"  (1.651 m)   Body mass index is 29.05 kg/m. No exam data present  General: well developed, well nourished,  pleasant middle-aged male, seated, in no evident distress Head: head normocephalic and atraumatic.   Neck: supple with no carotid or supraclavicular bruits Cardiovascular: regular rate and rhythm, no murmurs Musculoskeletal: no deformity Skin:  no rash/petichiae Vascular:  Normal pulses all extremities   Neurologic Exam Mental Status: Awake and fully alert.   Mild dysarthria with hypophonia.  Oriented to place and time. Recent and remote memory intact. Attention span, concentration and fund of knowledge appropriate. Mood  and affect appropriate.  Cranial Nerves: Pupils equal, briskly reactive to light. Extraocular movements full without nystagmus. Visual fields full to confrontation. Hearing intact. Facial sensation intact. Face, tongue, palate moves normally and symmetrically.  Motor:  RUE: 4+/5 with mildly weak grip strength and noted spasticity [name]: 4/5 with mildly weak ankle dorsiflexion and noted spasticity Full strength left upper and lower extremity Sensory.: intact to touch , pinprick , position and vibratory sensation.  Coordination: Rapid alternating movements normal in all extremities except decreased right hand. Finger-to-nose and heel-to-shin performed accurately on left side. Gait and Station: Arises from chair without difficulty. Stance is normal. Gait demonstrates  hemiplegic gait and stiff and right leg with use of Rollator walker Reflexes: 1+ and symmetric. Toes downgoing.       Diagnostic Data (Labs, Imaging, Testing)  CT HEAD WO CONTRAST 08/30/2018 IMPRESSION: 1. 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 2. Right [facility] pattern paranasal sinusitis.  CT ANGIO HEAD W OR WO CONTRAST CT ANGIO NECK W OR WO CONTRAST 08/30/2018 IMPRESSION: 1. Negative CTA for large vessel occlusion. No core infarct evident by CT perfusion. 2. Short-segment severe mid left M1 stenosis, likely similar as compared to previous MRI from 11/19/2008. Associated delayed perfusion throughout the left MCA territory distally. 3. No other hemodynamically significant or correctable stenosis within the major arterial vasculature of the head and neck. 4. Persistent left trigeminal artery.  MR BRAIN WO CONTRAST 08/30/2018 IMPRESSION: 1. Multiple small foci of acute/early subacute infarction are present in the left MCA distribution concentrated in left basal ganglia inclusive of the corona radiata and posterior limb of internal capsule. No hemorrhage or mass effect. 2. Mild  chronic microvascular ischemic changes and volume loss of the brain. Small chronic infarcts in right parietal and occipital lobes. 3. Right frontal, ethmoid, and maxillary sinus disease is a right middle meatus obstructive pattern, direct visualization recommended.  CT HEAD WO CONTRAST 09/01/2018 IMPRESSION: More confluent acute infarct in the left basal ganglia and corona radiata when compared to diffusion imaging 2 days ago. No new distribution infarct or hemorrhagic conversion.  ECHOCARDIOGRAM 08/31/2018 IMPRESSIONS  1. The left ventricle has normal systolic function of 0000000. The cavity size was normal. There is no increased left ventricular wall thickness. Echo evidence of impaired diastolic relaxation.  2. The right ventricle has normal systolic function. The cavity was normal. There is no increase in right ventricular wall thickness.  3. The mitral valve is normal in structure. No evidence of mitral valve stenosis. No significant mitral regurgitation.  4. The tricuspid valve is normal in structure.  5. The aortic valve is tricuspid There is mild calcification of the aortic valve. No aortic stenosis.  6. The pulmonic valve was normal in structure.  7. The aortic root and ascending aorta are normal in size and structure.  8. No evidence of left ventricular regional wall motion abnormalities.  9. The inferior vena cava is normal in size with greater than 50% respiratory variability. 10. No complete TR doppler jet so unable to estimate PA systolic pressure.  ECHO TEE 09/04/2018 IMPRESSIONS  1. The left ventricle has normal systolic function of 0000000.  2. The right ventricle has normal systolic function.  3. Very small amount of bubble cross over was noted with cough/Valsalva.  4. The mitral valve is normal in structure.  5. The tricuspid valve was normal in structure.  6. The aortic valve is normal in structure.  7. The aortic root is normal in size and structure.  8. Small  patent foramen ovale with predominantly right to left shunting across the atrial septum.  9. Evidence of atrial level shunting detected by color flow Doppler. 10. No evidence of left ventricular regional wall motion abnormalities. 11. There is redundancy of the interatrial septum.      ASSESSMENT: Edward Glass is a 64 y.o. year old male here with left MCA patchy infarct secondary to left M1  occlusion with embolic pattern due to unclear etiology on 08/30/2018 and neurological worsening with extension of deficits on 09/01/2018. Vascular risk factors include intracranial stenosis, HLD and HTN.  Residual stroke deficits of right spastic hemiparesis and dysarthria with ongoing improvement    PLAN:  1. Left MCA infarct: Continue clopidogrel 75 mg daily  and atorvastatin for secondary stroke prevention. Maintain strict control of hypertension with blood pressure goal between 1 30-1 50, diabetes with hemoglobin A1c goal below 6.5% and cholesterol with LDL cholesterol (bad cholesterol) goal below 70 mg/dL.  I also advised the patient to eat a healthy diet with plenty of whole grains, cereals, fruits and vegetables, exercise regularly with at least 30 minutes of continuous activity daily and maintain ideal body weight.  Continue to monitor loop recorder for atrial fibrillation. 2. Right spastic hemiparesis, post stroke: Recommend trialing baclofen 5 mg twice daily as needed for ongoing spasticity.  Reviewed potential side effects with additional education provided and patient verbalized understanding.  Advised to call office in 2 weeks for potential need of increase.  Reports difficulty tolerating Zanaflex and no benefit with Botox.  Advised to continue to do exercises at home 3. Depression, post stroke: Continue Prozac 20 mg daily with ongoing benefit in regards to depression post stroke.  Refill placed today but did advise ongoing refills and monitoring/management will be done by PCP 4. HTN: Advised to  continue current treatment regimen.    Advised to continue to monitor at home along with continued follow-up with PCP for management 5. HLD: Advised to continue current treatment regimen along with continued follow-up with PCP for future prescribing and monitoring of lipid panel      Follow up in 4 months or call earlier if needed   Greater than 50% of time during this 30 minute visit was spent on counseling, review of prior diagnosis of left MCA infarct, reviewing risk factor management of HTN and HLD, discussion regarding residual deficits and potential use of baclofen, planning of further management along with potential future management, and discussion with patient and family answering all questions.    Frann Rider, AGNP-BC  [facility] [address] [place], [place] [address]  Phone [phone] Fax [phone] Note: This document was prepared with digital dictation and possible smart phrase technology. Any transcriptional errors that result from this process are unintentional.    6

## 2019-09-06 NOTE — Progress Notes (Signed)
I agree with the above plan 

## 2019-09-06 NOTE — Patient Instructions (Signed)
Continue clopidogrel 75 mg daily  and lipitor  for secondary stroke prevention  Continue to follow up with PCP regarding cholesterol and blood pressure management   Recommend starting on baclofen 5mg  twice daily as needed which can help with spasms  Will place refill for prozac but you will need to schedule f/u visit with Dr. Doreene Nest for future refills  Continue to monitor blood pressure at home  Maintain strict control of hypertension with blood pressure goal below 130/90, diabetes with hemoglobin A1c goal below 6.5% and cholesterol with LDL cholesterol (bad cholesterol) goal below 70 mg/dL. I also advised the patient to eat a healthy diet with plenty of whole grains, cereals, fruits and vegetables, exercise regularly and maintain ideal body weight.  Followup in the future with me in 4 months or call earlier if needed       Thank you for coming to see Edward Glass at [facility]. I hope we have been able to provide you high quality care today.  You may receive a patient satisfaction survey over the next few weeks. We would appreciate your feedback and comments so that we may continue to improve ourselves and the health of our patients.     Baclofen tablets What is this medicine? BACLOFEN (BAK loe fen) helps relieve spasms and cramping of muscles. It may be used to treat symptoms of multiple sclerosis or spinal cord injury. This medicine may be used for other purposes; ask your health care provider or pharmacist if you have questions. COMMON BRAND NAME(S): ED Baclofen, Lioresal What should I tell my health care provider before I take this medicine? They need to know if you have any of these conditions:  kidney disease  seizures  stroke  an unusual or allergic reaction to baclofen, other medicines, foods, dyes, or preservatives  pregnant or trying to get pregnant  breast-feeding How should I use this medicine? Take this medicine by mouth. Swallow it with a drink  of water. Follow the directions on the prescription label. Do not take more medicine than you are told to take. Talk to your pediatrician regarding the use of this medicine in children. Special care may be needed. Overdosage: If you think you have taken too much of this medicine contact a poison control center or emergency room at once. NOTE: This medicine is only for you. Do not share this medicine with others. What if I miss a dose? If you miss a dose, take it as soon as you can. If it is almost time for your next dose, take only that dose. Do not take double or extra doses. What may interact with this medicine? Do not take this medication with any of the following medicines:  narcotic medicines for cough This medicine may also interact with the following medications:  alcohol  antihistamines for allergy, cough and cold  certain medicines for anxiety or sleep  certain medicines for depression like amitriptyline, fluoxetine, sertraline  certain medicines for seizures like phenobarbital, primidone  general anesthetics like halothane, isoflurane, methoxyflurane, propofol  local anesthetics like lidocaine, pramoxine, tetracaine  medicines that relax muscles for surgery  narcotic medicines for pain  phenothiazines like chlorpromazine, mesoridazine, prochlorperazine, thioridazine This list may not describe all possible interactions. Give your health care provider a list of all the medicines, herbs, non-prescription drugs, or dietary supplements you use. Also tell them if you smoke, drink alcohol, or use illegal drugs. Some items may interact with your medicine. What should I watch for while using this medicine?  Tell your doctor or health care professional if your symptoms do not start to get better or if they get worse. Do not suddenly stop taking your medicine. If you do, you may develop a severe reaction. If your doctor wants you to stop the medicine, the dose will be slowly lowered  over time to avoid any side effects. Follow the advice of your doctor. You may get drowsy or dizzy. Do not drive, use machinery, or do anything that needs mental alertness until you know how this medicine affects you. Do not stand or sit up quickly, especially if you are an older patient. This reduces the risk of dizzy or fainting spells. Alcohol may interfere with the effect of this medicine. Avoid alcoholic drinks. If you are taking another medicine that also causes drowsiness, you may have more side effects. Give your health care provider a list of all medicines you use. Your doctor will tell you how much medicine to take. Do not take more medicine than directed. Call emergency for help if you have problems breathing or unusual sleepiness. What side effects may I notice from receiving this medicine? Side effects that you should report to your doctor or health care professional as soon as possible:  allergic reactions like skin rash, itching or hives, swelling of the face, lips, or tongue  breathing problems  changes in emotions or moods  changes in vision  chest pain  fast, irregular heartbeat  feeling faint or lightheaded, falls  hallucinations  loss of balance or coordination  ringing of the ears  seizures  trouble passing urine or change in the amount of urine  trouble walking  unusually weak or tired Side effects that usually do not require medical attention (report to your doctor or health care professional if they continue or are bothersome):  changes in taste  confusion  constipation  diarrhea  dry mouth  headache  muscle weakness  nausea, vomiting  trouble sleeping This list may not describe all possible side effects. Call your doctor for medical advice about side effects. You may report side effects to FDA at 1-800-FDA-1088. Where should I keep my medicine? Keep out of the reach of children. Store at room temperature between 15 and 30 degrees C (59  and 86 degrees F). Keep container tightly closed. Throw away any unused medicine after the expiration date. NOTE: This sheet is a summary. It may not cover all possible information. If you have questions about this medicine, talk to your doctor, pharmacist, or health care provider.  2020 Elsevier/Gold Standard (2017-04-23 09:56:42)

## 2019-09-24 ENCOUNTER — Ambulatory Visit: Payer: BC Managed Care – PPO | Attending: Internal Medicine

## 2019-09-29 ENCOUNTER — Ambulatory Visit: Payer: BC Managed Care – PPO | Attending: Internal Medicine

## 2019-09-29 DIAGNOSIS — Z23 Encounter for immunization: Secondary | ICD-10-CM | POA: Insufficient documentation

## 2019-09-29 NOTE — Progress Notes (Signed)
   Covid-19 Vaccination Clinic  Name:  LARRY BARTOS    MRN: [id number] DOB: May 05, 1956  09/29/2019  Mr. Serven was observed post Covid-19 immunization for 15 minutes without incident. He was provided with Vaccine Information Sheet and instruction to access the V-Safe system.   Mr. Trembly was instructed to call 911 with any severe reactions post vaccine: Marland Kitchen Difficulty breathing  . Swelling of face and throat  . A fast heartbeat  . A bad rash all over body  . Dizziness and weakness   Immunizations Administered    Name Date Dose VIS Date Route   Pfizer COVID-19 Vaccine 09/29/2019 10:40 AM 0.3 mL 07/06/2019 Intramuscular   Manufacturer: [place]   Lot: [id number]   [place]: [id number]

## 2019-10-04 ENCOUNTER — Ambulatory Visit (INDEPENDENT_AMBULATORY_CARE_PROVIDER_SITE_OTHER): Payer: BC Managed Care – PPO | Admitting: *Deleted

## 2019-10-04 DIAGNOSIS — I63512 Cerebral infarction due to unspecified occlusion or stenosis of left middle cerebral artery: Secondary | ICD-10-CM

## 2019-10-04 LAB — CUP PACEART REMOTE DEVICE CHECK
Date Time Interrogation Session: 20210311002222
Implantable Pulse Generator Implant Date: 20200210

## 2019-10-05 NOTE — Progress Notes (Signed)
ILR Remote 

## 2019-10-20 ENCOUNTER — Ambulatory Visit: Payer: BC Managed Care – PPO | Attending: Internal Medicine

## 2019-10-20 DIAGNOSIS — Z23 Encounter for immunization: Secondary | ICD-10-CM

## 2019-10-20 NOTE — Progress Notes (Signed)
   Covid-19 Vaccination Clinic  Name:  Edward Glass    MRN: [id number] DOB: 1956-02-09  10/20/2019  Mr. Klaus was observed post Covid-19 immunization for 15 minutes without incident. He was provided with Vaccine Information Sheet and instruction to access the V-Safe system.   Mr. Zaner was instructed to call 911 with any severe reactions post vaccine: Marland Kitchen Difficulty breathing  . Swelling of face and throat  . A fast heartbeat  . A bad rash all over body  . Dizziness and weakness   Immunizations Administered    Name Date Dose VIS Date Route   Pfizer COVID-19 Vaccine 10/20/2019 12:04 PM 0.3 mL 07/06/2019 Intramuscular   Manufacturer: [place]   Lot: [id number]   [place]: [id number]

## 2019-11-05 ENCOUNTER — Ambulatory Visit (INDEPENDENT_AMBULATORY_CARE_PROVIDER_SITE_OTHER): Payer: Medicare Other | Admitting: *Deleted

## 2019-11-05 DIAGNOSIS — I63512 Cerebral infarction due to unspecified occlusion or stenosis of left middle cerebral artery: Secondary | ICD-10-CM

## 2019-11-05 LAB — CUP PACEART REMOTE DEVICE CHECK
Date Time Interrogation Session: 20210411033321
Implantable Pulse Generator Implant Date: 20200210

## 2019-11-06 NOTE — Progress Notes (Signed)
ILR Remote 

## 2019-11-13 ENCOUNTER — Other Ambulatory Visit: Payer: Self-pay | Admitting: Adult Health

## 2019-11-13 MED ORDER — CLOPIDOGREL BISULFATE 75 MG PO TABS: 75.0000 mg | ORAL_TABLET | Freq: Every day | ORAL | 3 refills | Status: DC

## 2019-11-13 NOTE — Telephone Encounter (Signed)
[facility]ix refill [facility] [place].  I called pt to discuss. No answer, left a message asking him to call me back. If pt calls back please advise him of this.

## 2019-11-13 NOTE — Telephone Encounter (Signed)
1) Medication(s) Requested (by name): clopidogrel (PLAVIX) 75 MG tablet   2) [facility] [place] on corner of [address] and [address]

## 2019-12-05 IMAGING — DX CHEST - 2 VIEW
2 series · 2 of 2 positions shown · non-contrast
Comparison: 08/30/2018

CLINICAL DATA: Chest pain

EXAM:
CHEST - 2 VIEW

[w chest pa]
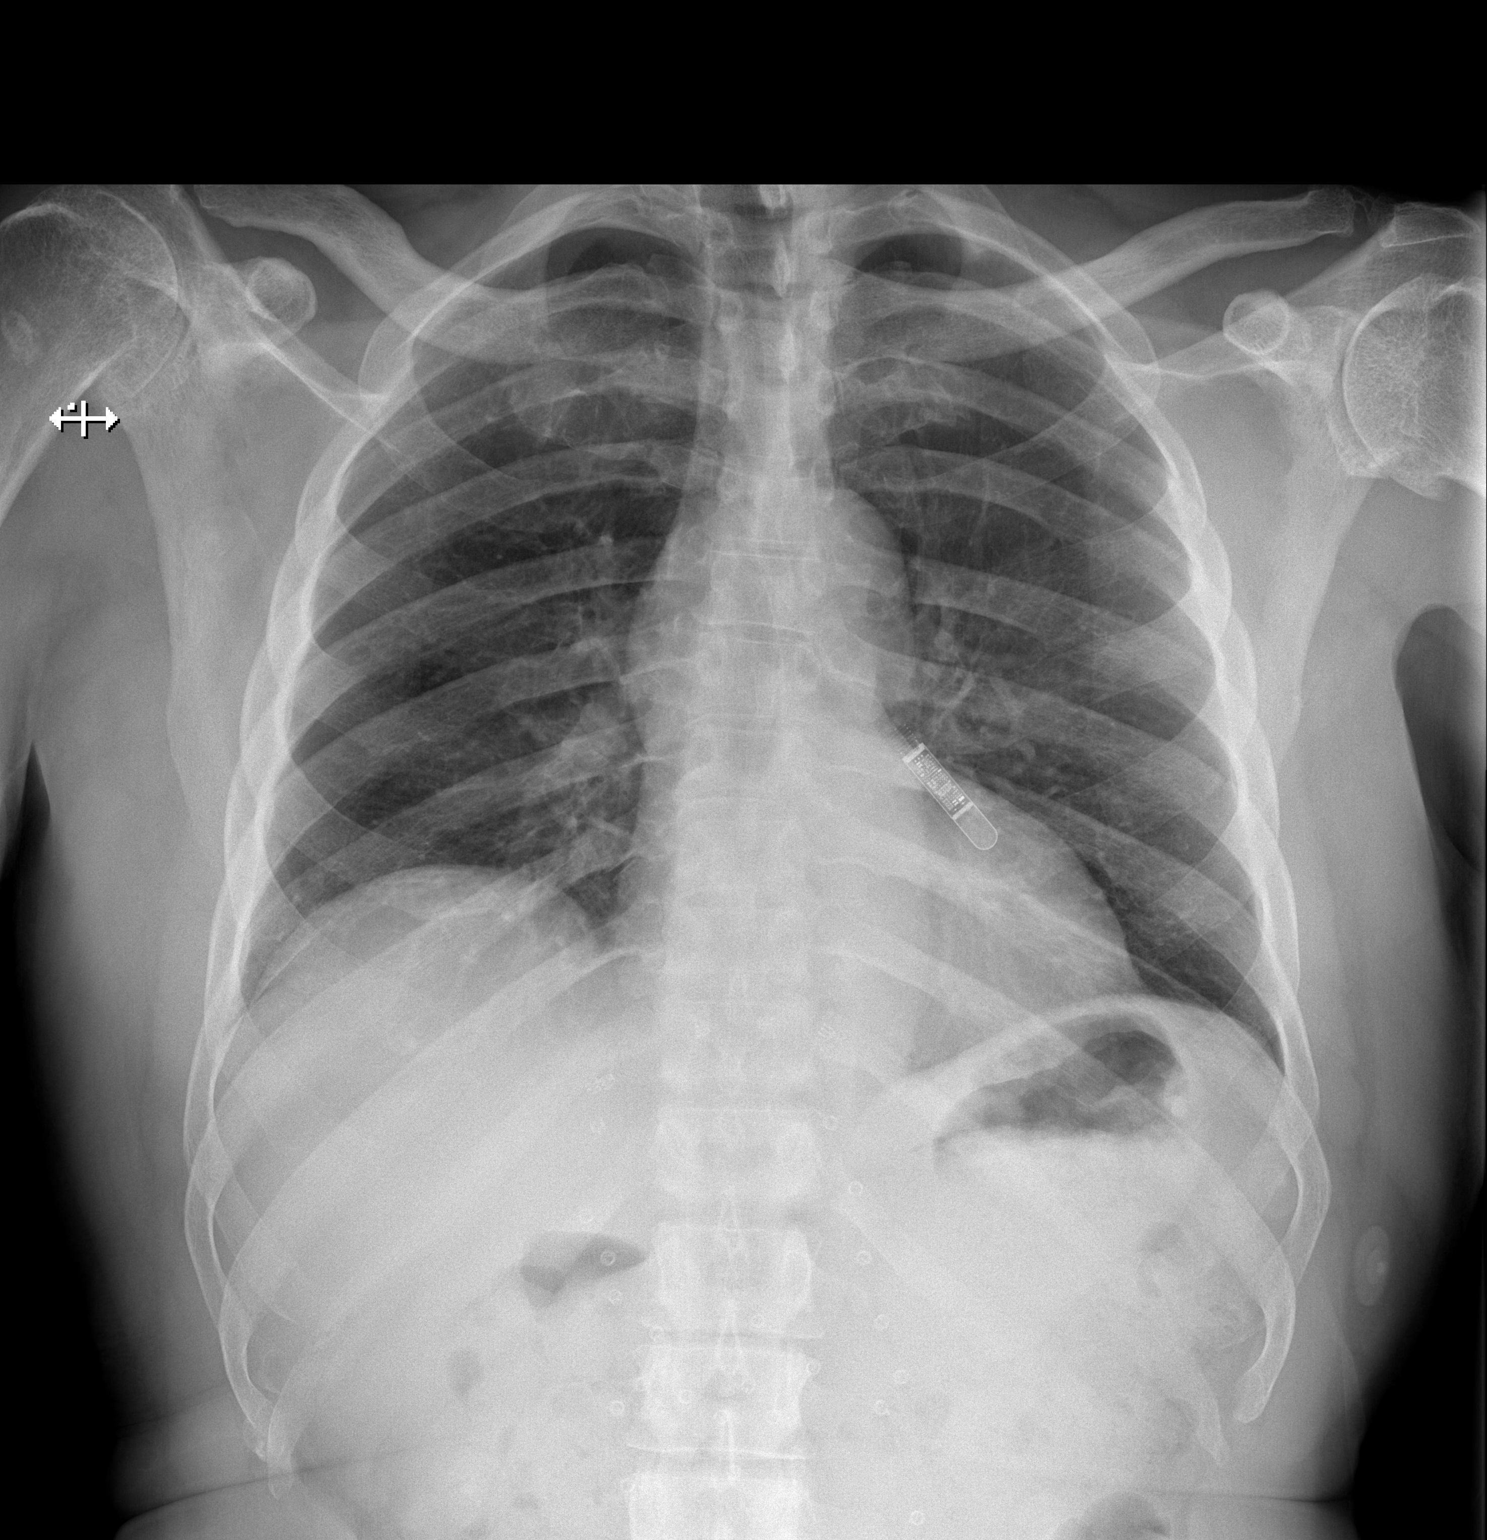

[w chest lat]
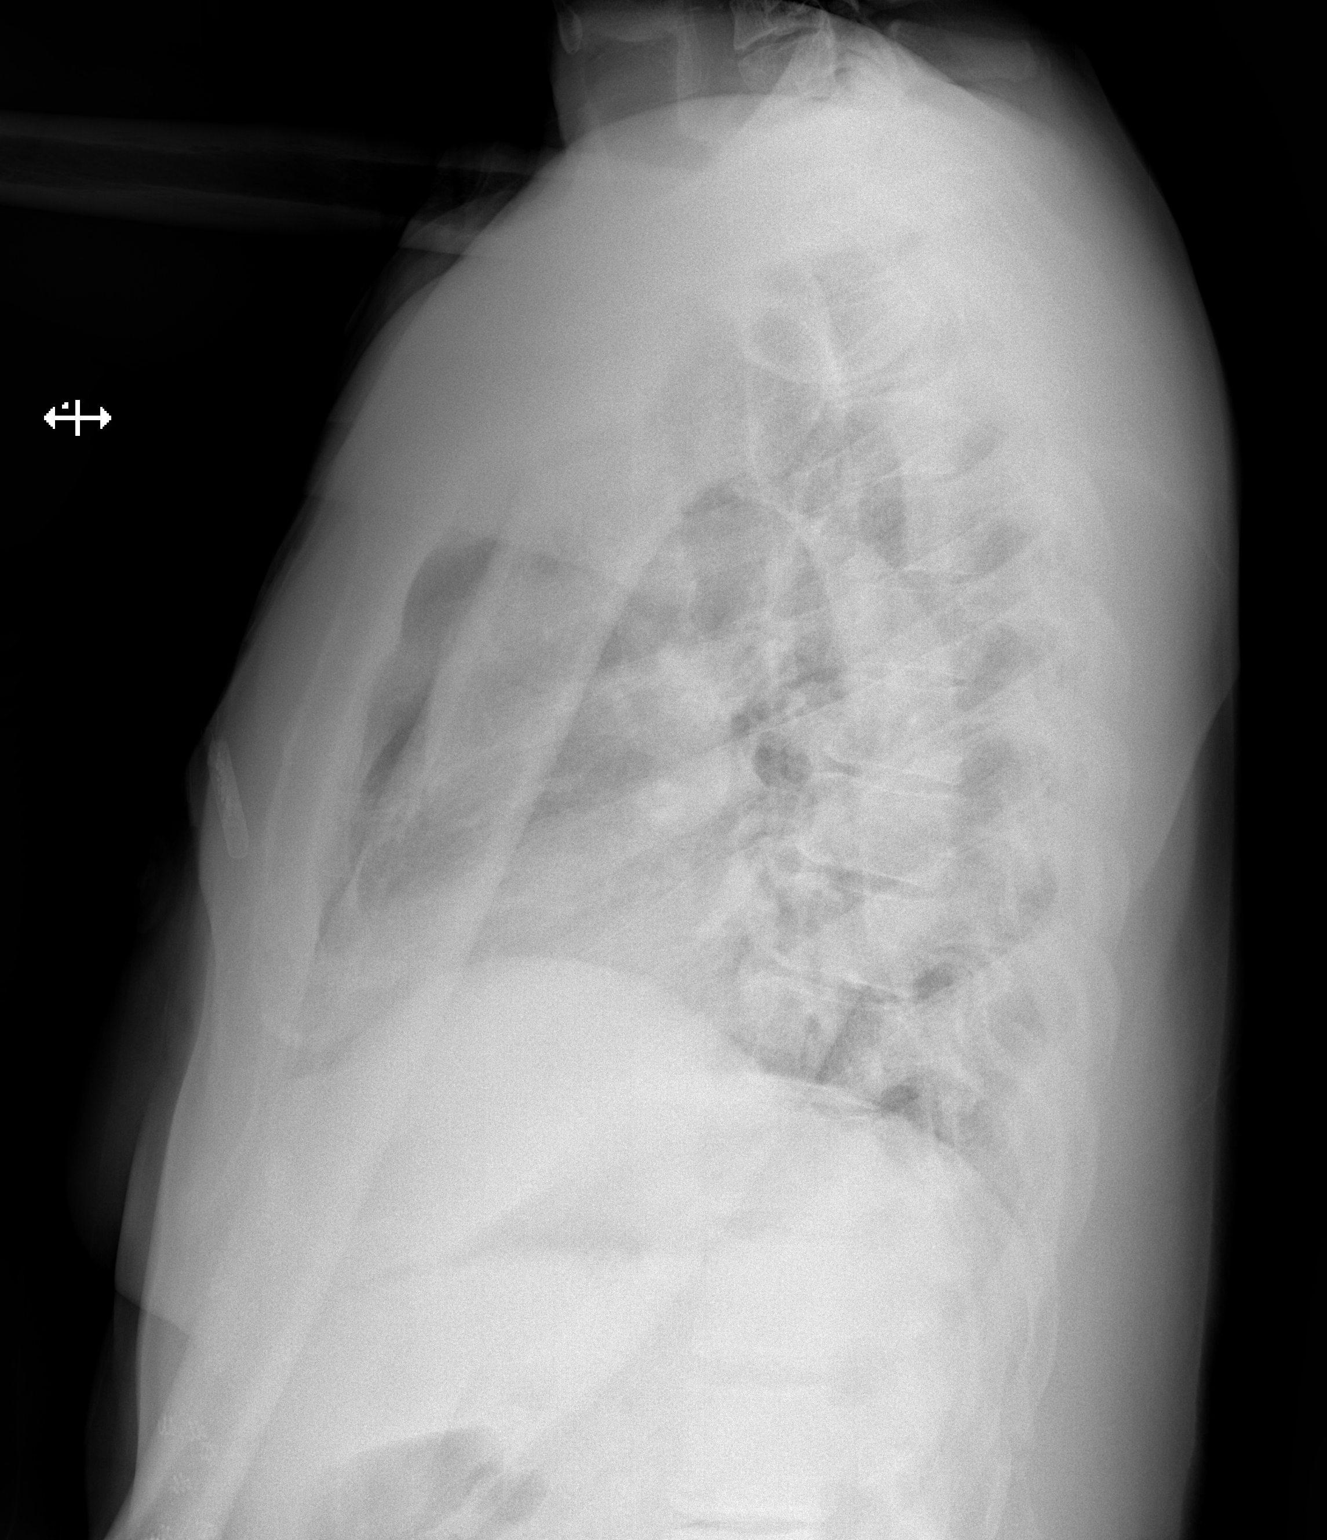

[2 of 2 positions shown; findings below may reference images not displayed]

FINDINGS: Cardiac shadow is within normal limits. Loop recorder is noted. The
lungs are clear bilaterally. No acute bony abnormality is seen.
IMPRESSION: No active cardiopulmonary disease.

## 2019-12-06 LAB — CUP PACEART REMOTE DEVICE CHECK
Date Time Interrogation Session: 20210512032948
Implantable Pulse Generator Implant Date: 20200210

## 2019-12-10 ENCOUNTER — Ambulatory Visit (INDEPENDENT_AMBULATORY_CARE_PROVIDER_SITE_OTHER): Payer: Medicare Other | Admitting: *Deleted

## 2019-12-10 DIAGNOSIS — I63512 Cerebral infarction due to unspecified occlusion or stenosis of left middle cerebral artery: Secondary | ICD-10-CM

## 2019-12-11 NOTE — Progress Notes (Signed)
[facility] Summary Report / Loop Recorder 

## 2020-01-10 ENCOUNTER — Ambulatory Visit: Payer: Medicare Other | Admitting: Adult Health

## 2020-01-10 ENCOUNTER — Encounter: Payer: Self-pay | Admitting: Adult Health

## 2020-01-10 ENCOUNTER — Other Ambulatory Visit: Payer: Self-pay

## 2020-01-10 VITALS — BP 122/77 | HR 62 | Wt 170.0 lb

## 2020-01-10 DIAGNOSIS — E785 Hyperlipidemia, unspecified: Secondary | ICD-10-CM

## 2020-01-10 DIAGNOSIS — R252 Cramp and spasm: Secondary | ICD-10-CM

## 2020-01-10 DIAGNOSIS — F0631 Mood disorder due to known physiological condition with depressive features: Secondary | ICD-10-CM

## 2020-01-10 DIAGNOSIS — Z8673 Personal history of transient ischemic attack (TIA), and cerebral infarction without residual deficits: Secondary | ICD-10-CM

## 2020-01-10 DIAGNOSIS — I69398 Other sequelae of cerebral infarction: Secondary | ICD-10-CM | POA: Diagnosis not present

## 2020-01-10 DIAGNOSIS — M25511 Pain in right shoulder: Secondary | ICD-10-CM

## 2020-01-10 DIAGNOSIS — I10 Essential (primary) hypertension: Secondary | ICD-10-CM

## 2020-01-10 DIAGNOSIS — G8929 Other chronic pain: Secondary | ICD-10-CM

## 2020-01-10 MED ORDER — BACLOFEN 5 MG PO TABS: 5.0000 mg | ORAL_TABLET | Freq: Two times a day (BID) | ORAL | 3 refills | Status: DC | PRN

## 2020-01-10 NOTE — Progress Notes (Signed)
[facility] [address] [place]. [place] [address] [phone]       STROKE FOLLOW UP NOTE  [name] Date of Birth:  Nov 10, 1955 Medical Record Number:  [id number]   Reason for Referral: Cryptogenic stroke follow up [place] provider: Dr. Leonie Man PCP: Katherina Mires, MD     CHIEF COMPLAINT:  Chief Complaint  Patient presents with  . Follow-up    CVA fu rm 9. pt said he is having no new sx    HPI:  Today, 01/10/2020, Edward Glass returns for follow-up regarding stroke in 08/2018.  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  Denies new stroke/TIA symptoms.  Currently ambulating with a quad cane and denies any recent falls.  Reports typically caring cane at his side "just in case" but he is here to walk without it.  Initiated baclofen 5 mg twice daily as needed for spasticity.  Difficulty tolerating daytime dose due to increased fatigue has continued 5 mg dosage at night with only mild benefit.  He questions benefit of restarting Botox as he only received 2 rounds.  He does report right shoulder pain which is chronic but worsened post stroke.  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  Continues on clopidogrel and atorvastatin for secondary stroke prevention.  Prior lipid panel on 10/09/2019 by PCP showed LDL 51.  Blood pressure today 122/77.  Loop recorder is not shown atrial fibrillation thus far.  No further concerns at this time.    History provided for reference purposes only Update 09/06/2019 JM: Edward Glass is a 64 year old male who is being seen today for cryptogenic stroke follow-up.  He was previously seen via virtual visit on 11/08/2018 and has not previously followed up as recommended.  Residual  stroke deficits include spastic right hemiparesis and dysarthria with mild improvement.  He continues to ambulate with a Rollator walker and denies any recent falls.  Greatest concern today is including continued right-sided spasticity.  Trial of Botox injections but denies benefit as well as trial of Zanaflex but patient self discontinued due to reported side effect of upset stomach.  Completed 3 months DAPT and continues on Plavix alone without bleeding or bruising.  Continues on atorvastatin without myalgias.  Blood pressure today 116/78.  Continues to follow with PCP regularly for HTN and HLD management/monitoring.  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  Loop recorder has not shown atrial fibrillation thus far.  Denies new or worsening stroke/TIA symptoms.  Initial visit via virtual 11/08/2018: He has been stable from a stroke standpoint with residual deficits of aphasia, cognitive deficits and right hemiparesis but does endorse improvement.  He continues to participate at neuro rehab PT/OT/ST along with continuing exercises at home.  He is currently ambulating with a quad cane and denies any recent falls.  He does need some assistance with bathing and dressing but continues to be able to do more on his own each day.  He no longer has any difficulties with swallowing and is currently on a regular diet.  He does endorse increased salivation.  He continues on aspirin and Plavix without side effects of bleeding or bruising.  Continues on atorvastatin without side effects myalgias.  Blood pressures not  routinely monitored at home and encourage daughter and patient to obtain cough to start monitoring.  He does endorse occasional depression difficulties such as feeling down or increased anxiety.  It was recommended to initiate Prozac during hospital admission but states once he went home, he did not continue as he felt it was not needed.  Per review of loop recorder, no report of  atrial fibrillation found but per patient and daughter, they were contacted by cardiology stating arrhythmia had been found and is in the process of scheduling visit with cardiology.  No further concerns at this time.  Denies new or worsening stroke/TIA symptoms.  Stroke admission 08/30/2018: Edward Glass is a 64 year old male who presented to [facility] ED with right-sided weakness and aphasia.   CT head reviewed which showed age indeterminate small vessel ischemia in the left BG.  CTA showed possible left M1 occlusion versus severe stenosis.  CT perfusion no infarct for but large penumbra.  MRI  brain reviewed and showed left MCA patchy infarcts mostly concentrated in the left BG. He arrived greater than 24h from time since last well, therefore no acute stroke interventions done.  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  He unfortunately had neurological worsening with severe aphasia and dense right hemiplegia with extension of deficits on 09/01/2018.  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   When pts dtrs arrive, they both explain that in 2016 after tricep repair surgery they noted a possible arrhythmia and placed him on holter monitor for 30d, but was unrevealing.  2D echo showed an EF of 55 to 60%.  TCD bubble study negative for PFO.  Lower extremity venous Dopplers negative for DVT.  TEE showed small PFO/bubble crossover noted during Valsalva which was not felt to be clinically significant along with no evidence of cardiac thrombus therefore loop recorder placed.   Recommended DAPT for 3 months then Plavix alone due to severe left M1 stenosis.  HTN stable recommended long-term BP goal 1 30-1 50 due left M1 stenosis.  HLD 104 and initiated atorvastatin 40 mg daily.  Other stroke risk factors include family history of stroke but no personal history of stroke.  Other active problems including  reactive depression as he previously worked as a Wellsite geologist and overall healthy and was devastated with diagnosis and residual deficits.  Initiated Prozac on 08/31/2018.  He had residual deficits of right hemiparesis, dysarthria and mixed aphasia and was discharged to [facility] for ongoing therapy.     ROS:   14 system review of systems performed and negative with exception of speech difficulty, weakness, muscle spasticity  PMH:  Past Medical History:  Diagnosis Date  . Atypical chest pain 08/10/2013  . Elevated aspartate aminotransferase level 06/16/2015  . Excessive salivation 06/16/2015  . H/O nutritional disorder 06/16/2015  . 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 Screening for prostate cancer 08/21/2014  . 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 HERNIA REPAIR  2006  . left tricep surgery    . LOOP RECORDER INSERTION N/A 09/04/2018   Procedure: LOOP RECORDER INSERTION;  Surgeon: Evans Lance, MD;  Location: [facility];  Service: Cardiovascular;  Laterality: N/A;  . TEE WITHOUT CARDIOVERSION N/A 09/04/2018   Procedure: TRANSESOPHAGEAL ECHOCARDIOGRAM (TEE);  Surgeon: Jerline Pain, MD;  Location: [facility] ENDOSCOPY;  Service:  Cardiovascular;  Laterality: N/A;    Social History:  Social History   Socioeconomic History  . Marital status: Married    Spouse name: Not on file  . Number of children: Not on file  . Years of education: Not on file  . Highest education level: Not on file  Occupational History  . Not on file  Tobacco Use  . Smoking status: Never Smoker  . Smokeless tobacco: Never Used  Substance and Sexual Activity  . Alcohol use: No  . Drug use: No  . Sexual activity: Not on file  Other Topics Concern  . Not on file  Social History Narrative  . Not on file   Social Determinants of Health   Financial Resource Strain:   . Difficulty of Paying Living Expenses:   Food  Insecurity:   . Worried About Charity fundraiser in the Last Year:   . Arboriculturist in the Last Year:   Transportation Needs:   . Film/video editor (Medical):   Marland Kitchen Lack of Transportation (Non-Medical):   Physical Activity:   . Days of Exercise per Week:   . Minutes of Exercise per Session:   Stress:   . Feeling of Stress :   Social Connections:   . Frequency of Communication with Friends and Family:   . Frequency of Social Gatherings with Friends and Family:   . Attends Religious Services:   . Active Member of Clubs or Organizations:   . Attends Archivist Meetings:   Marland Kitchen Marital Status:   Intimate Partner Violence:   . Fear of Current or Ex-Partner:   . Emotionally Abused:   Marland Kitchen Physically Abused:   . Sexually Abused:     Family History:  Family History  Problem Relation Age of Onset  . Kidney failure Mother   . Cancer Mother   . Prostate cancer Father   . Cancer Father   . Diabetes Father   . Colon cancer Neg Hx   . Esophageal cancer Neg Hx   . Rectal cancer Neg Hx   . Stomach cancer Neg Hx     Medications:   Current Outpatient Medications on File Prior to Visit  Medication Sig Dispense Refill  . acetaminophen (TYLENOL) 325 MG tablet Take 2 tablets (650 mg total) by mouth every 4 (four) hours as needed for mild pain (or temp > 37.5 C (99.5 F)). (Patient taking differently: Take 650 mg by mouth as needed for mild pain (or temp > 37.5 C (99.5 F)). )    . atorvastatin (LIPITOR) 40 MG tablet Take 1 tablet (40 mg total) by mouth daily at 6 PM. 90 tablet 3  . Baclofen 5 MG TABS Take 5 mg by mouth 2 (two) times daily as needed. 60 tablet 3  . clopidogrel (PLAVIX) 75 MG tablet Take 1 tablet (75 mg total) by mouth daily. 90 tablet 3  . diclofenac sodium (VOLTAREN) 1 % GEL Apply 2 g topically 4 (four) times daily. (Patient taking differently: Apply 2 g topically as needed. ) 100 g 5  . FLUoxetine (PROZAC) 20 MG capsule Take 1 capsule (20 mg total) by mouth daily.  30 capsule 1  . multivitamin (ONE-A-DAY MEN'S) TABS tablet Take 1 tablet by mouth daily.     No current facility-administered medications on file prior to visit.    Allergies:  No Known Allergies   Physical Exam  Vitals:   01/10/20 1114  BP: 122/77  Pulse: 62  Weight: 170 lb (  77.1 kg)   Body mass index is 28.29 kg/m. No exam data present  General: well developed, well nourished, pleasant middle-aged male, seated, in no evident distress Head: head normocephalic and atraumatic.   Neck: supple with no carotid or supraclavicular bruits Cardiovascular: regular rate and rhythm, no murmurs Musculoskeletal: no deformity Skin:  no rash/petichiae Vascular:  Normal pulses all extremities   Neurologic Exam Mental Status: Awake and fully alert. Mild dysarthria with hypophonia. Oriented to place and time. Recent and remote memory intact. Attention span, concentration and fund of knowledge appropriate. Mood and affect appropriate.  Cranial Nerves: Pupils equal, briskly reactive to light. Extraocular movements full without nystagmus. Visual fields full to confrontation. Hearing intact. Facial sensation intact. Face, tongue, palate moves normally and symmetrically.  Motor:  RUE: 5/5 with decreased grip strength and increased tone shoulder, bicep, tricep and fingers [name]: 5/5 with mildly weak ankle dorsiflexion and increased tone hip Full strength left upper and lower extremity Sensory.: intact to touch , pinprick , position and vibratory sensation.  Coordination: Rapid alternating movements normal in all extremities except decreased right hand. Finger-to-nose and heel-to-shin performed accurately on left side.  Unable to adequately assess right side due to increased spasticity Gait and Station: Arises from chair without difficulty. Stance is normal. Gait demonstrates  hemiplegic gait and stiffened right leg carrying quad cane but did not actually have to place on ground. Reflexes: 2+ RUE and [name]  and 1+ left side. Toes downgoing.        ASSESSMENT: RENEE BEALE is a 64 y.o. year old male here with left MCA patchy infarct secondary to left M1 occlusion with embolic pattern due to unclear etiology on 08/30/2018 and neurological worsening with extension of deficits on 09/01/2018.  Loop recorder placed which has not shown atrial fibrillation thus far.  Vascular risk factors include intracranial stenosis, HLD and HTN.  Residual stroke deficits of right upper and lower spasticity and dysarthria with ongoing improvement    PLAN:  1. Cryptogenic left MCA infarct:  -Right upper and lower spasticity, poststroke: Recommended increasing nighttime dose of baclofen to 10 mg nightly as he is unable to tolerate daytime dosage.  Encouraged him to follow-up with PMR to discuss possible benefit and restart of Botox injections for further benefit.  Advised importance of routine exercise and activity -Right shoulder pain: Chronic condition worsened post stroke.  Referral placed to orthopedics for further evaluation and possible treatment options such as injections -Continue to monitor loop recorder for atrial fibrillation -Continue clopidogrel 75 mg daily  and atorvastatin for secondary stroke prevention.  -Maintain strict control of hypertension with blood pressure goal between 1 30-1 50, diabetes with hemoglobin A1c goal below 6.5% and cholesterol with LDL cholesterol (bad cholesterol) goal below 70 mg/dL.  I also advised the patient to eat a healthy diet with plenty of whole grains, cereals, fruits and vegetables, exercise regularly with at least 30 minutes of continuous activity daily and maintain ideal body weight.  Continue to monitor loop recorder for atrial fibrillation. 2. Depression, post stroke: Continue Prozac 20 mg daily with ongoing benefit in regards to depression post stroke.  Currently managed by PCP.  Attempted to decrease dose recently but had worsening depression 3. HTN: Stable.  Continue  to follow with PCP for monitoring and management 4. HLD: Continuation of atorvastatin with recent LDL satisfactory.  Continue to follow with PCP for prescribing, monitoring and management   Follow up in 6 months or call earlier if needed  CC: Pramod  Leonie Man, MD Katherina Mires, MD   I spent 35 minutes of face-to-face and non-face-to-face time with patient.  This included previsit chart review, lab review, study review, order entry, electronic health record documentation, patient education   Frann Rider, [facility]  [facility] [address] [place], [place] [address]  Phone [phone] Fax [phone] Note: This document was prepared with digital dictation and possible smart phrase technology. Any transcriptional errors that result from this process are unintentional.    6

## 2020-01-10 NOTE — Patient Instructions (Signed)
Continue clopidogrel 75 mg daily  and atorvastatin for secondary stroke prevention  Continue to follow up with PCP regarding cholesterol and blood pressure management   Continue baclofen 5 mg twice daily as needed for right-sided spasticity - you can try to take 10mg  (2 tabs) at bed time for further benefit  Referral will be placed to orthopedics for possible intervention of shoulder pain  Continue fluoxetine 20 mg daily for depression poststroke Continue to monitor blood pressure at home  Maintain strict control of hypertension with blood pressure goal below 130/90, diabetes with hemoglobin A1c goal below 6.5% and cholesterol with LDL cholesterol (bad cholesterol) goal below 70 mg/dL. I also advised the patient to eat a healthy diet with plenty of whole grains, cereals, fruits and vegetables, exercise regularly and maintain ideal body weight.  Followup in the future with me in 6 months or call earlier if needed       Thank you for coming to see [place] at [facility]. I hope we have been able to provide you high quality care today.  You may receive a patient satisfaction survey over the next few weeks. We would appreciate your feedback and comments so that we may continue to improve ourselves and the health of our patients.

## 2020-01-11 NOTE — Progress Notes (Signed)
I agree with the above plan 

## 2020-01-14 ENCOUNTER — Ambulatory Visit (INDEPENDENT_AMBULATORY_CARE_PROVIDER_SITE_OTHER): Payer: Medicare Other | Admitting: *Deleted

## 2020-01-14 DIAGNOSIS — I63512 Cerebral infarction due to unspecified occlusion or stenosis of left middle cerebral artery: Secondary | ICD-10-CM | POA: Diagnosis not present

## 2020-01-14 LAB — CUP PACEART REMOTE DEVICE CHECK
Date Time Interrogation Session: 20210621002344
Implantable Pulse Generator Implant Date: 20200210

## 2020-01-15 NOTE — Progress Notes (Signed)
[facility] Summary Report / Loop Recorder 

## 2020-01-24 ENCOUNTER — Ambulatory Visit: Payer: Self-pay

## 2020-01-24 ENCOUNTER — Ambulatory Visit: Payer: Medicare Other | Admitting: Orthopedic Surgery

## 2020-01-24 DIAGNOSIS — M19011 Primary osteoarthritis, right shoulder: Secondary | ICD-10-CM | POA: Diagnosis not present

## 2020-01-27 NOTE — Progress Notes (Signed)
Office Visit Note   Patient: Edward Glass           Date of Birth: [birth year]           MRN: [id number] Visit Date: 01/24/2020 Requested by: Frann Rider, NP [phone] [address] [place],  [place] [address] PCP: Katherina Mires, MD  Subjective: Chief Complaint  Patient presents with  . Right Shoulder - Pain    HPI: Edward Glass is a 64 y.o. male who presents to the office complaining of right shoulder pain.  Patient notes pain since February 2020 when he had a stroke.  He is right-hand dominant.  He is a former Airline pilot.  He states that pain does not wake him up at night and he has good strength of his right upper extremity.  He does note severe stiffness of the right shoulder.  He denies any significant radicular pain or numbness or tingling down the right arm.  He had excellent shoulder function prior to his stroke.  He works as a Physiological scientist at [employer].  He takes baclofen for his symptoms which provide some relief.  He had an x-ray in February 2020 that revealed advanced glenohumeral osteoarthritis..                ROS:  All systems reviewed are negative as they relate to the chief complaint within the history of present illness.  Patient denies fevers or chills.  Assessment & Plan: Visit Diagnoses:  1. Primary osteoarthritis, right shoulder     Plan: Patient is a 64 year old male who presents complaining of right shoulder pain.  He notes pain since February 2020 when he had a stroke.  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  He had x-rays in February 2020 that demonstrated advanced glenohumeral osteoarthritis.  He presents to the office today with severe right shoulder pain and severely limited right shoulder range of motion.  New radiographs taken today reveal severe end-stage OA, with progression compared with prior radiographs.  Discussed options available to patient.  He has had 1 cortisone injection that provided great relief about  a year ago.  He states that he is not considering surgery at this time.  He requests repeat cortisone injection.  Cortisone injection administered into the glenohumeral joint.  Plan for patient to follow-up as needed.  Recommended that he return if his pain does not improve with injection or relief only lasts a short amount of time.  He understands and agrees.  Follow-Up Instructions: No follow-ups on file.   Orders:  Orders Placed This Encounter  Procedures  . XR Shoulder Right   No orders of the defined types were placed in this encounter.     Procedures: Large Joint Inj: R glenohumeral on 01/29/2020 7:30 AM Indications: diagnostic evaluation and pain Details: 18 G 1.5 in needle, posterior approach  Arthrogram: No  Medications: 9 mL bupivacaine 0.5 %; 40 mg methylPREDNISolone acetate 40 MG/ML; 5 mL lidocaine 1 % Outcome: tolerated well, no immediate complications Procedure, treatment alternatives, risks and benefits explained, specific risks discussed. Consent was given by the patient. Immediately prior to procedure a time out was called to verify the correct patient, procedure, equipment, support staff and site/side marked as required. Patient was prepped and draped in the usual sterile fashion.       Clinical Data: No additional findings.  Objective: Vital Signs: There were no vitals taken for this visit.  Physical  Exam:  Constitutional: Patient appears well-developed HEENT:  Head: Normocephalic Eyes:EOM are normal Neck: Normal range of motion Cardiovascular: Normal rate Pulmonary/chest: Effort normal Neurologic: Patient is alert Skin: Skin is warm Psychiatric: Patient has normal mood and affect  Ortho Exam:  Right shoulder Exam Severely limited forward flexion, abduction, external rotation No TTP over the [facility] joint or bicipital groove Good subscapularis, supraspinatus, and infraspinatus strength Negative Hawkins impingement 5/5 grip strength, forearm  pronation/supination, and bicep strength No significant tenderness throughout the axial cervical spine or pain with cervical spine range of motion  Specialty Comments:  No specialty comments available.  Imaging: No results found.   PMFS History: Patient Active Problem List   Diagnosis Date Noted  . Slow transit constipation   . Benign prostatic hyperplasia with urinary retention   . Spastic hemiparesis of right dominant side ([place])   . Chronic right shoulder pain   . Primary osteoarthritis of right shoulder   . Left basal ganglia embolic stroke ([place]) 31/54/0086  . CVA (cerebral vascular accident) ([place]) 08/31/2018  . Elevated aspartate aminotransferase level 06/16/2015  . H/O nutritional disorder 06/16/2015  . Excessive salivation 06/16/2015  . 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 Heart palpitations 06/16/2015  . 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 Screening for prostate cancer 08/21/2014  . Atypical chest pain 08/10/2013  . 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 Atypical chest pain 08/10/2013  . Elevated aspartate aminotransferase level 06/16/2015  . Excessive salivation 06/16/2015  . H/O nutritional disorder 06/16/2015  . 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 Screening for prostate cancer 08/21/2014  . 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 Kidney failure Mother   . Cancer Mother   . Prostate cancer Father   . Cancer Father   . Diabetes Father   . Colon cancer Neg Hx   . Esophageal cancer Neg Hx   . Rectal cancer Neg Hx   . Stomach cancer Neg Hx     Past Surgical History:  Procedure Laterality Date  . HERNIA REPAIR  2006  . left tricep surgery    . LOOP RECORDER INSERTION N/A 09/04/2018   Procedure: LOOP RECORDER INSERTION;  Surgeon: Evans Lance, MD;  Location: [place] [facility];  Service: Cardiovascular;  Laterality: N/A;  . TEE  WITHOUT CARDIOVERSION N/A 09/04/2018   Procedure: TRANSESOPHAGEAL ECHOCARDIOGRAM (TEE);  Surgeon: Jerline Pain, MD;  Location: [facility] ENDOSCOPY;  Service: Cardiovascular;  Laterality: N/A;   Social History   Occupational History  . Not on file  Tobacco Use  . Smoking status: Never Smoker  . Smokeless tobacco: Never Used  Substance and Sexual Activity  . Alcohol use: No  . Drug use: No  . Sexual activity: Not on file

## 2020-01-28 ENCOUNTER — Encounter: Payer: Self-pay | Admitting: Orthopedic Surgery

## 2020-01-29 DIAGNOSIS — M19011 Primary osteoarthritis, right shoulder: Secondary | ICD-10-CM

## 2020-01-29 MED ORDER — LIDOCAINE HCL 1 % IJ SOLN
5.0000 mL | INTRAMUSCULAR | Status: AC | PRN
  Administered 2020-01-29: 5 mL

## 2020-01-29 MED ORDER — METHYLPREDNISOLONE ACETATE 40 MG/ML IJ SUSP
40.0000 mg | INTRAMUSCULAR | Status: AC | PRN
  Administered 2020-01-29: 40 mg via INTRA_ARTICULAR

## 2020-01-29 MED ORDER — BUPIVACAINE HCL 0.5 % IJ SOLN
9.0000 mL | INTRAMUSCULAR | Status: AC | PRN
  Administered 2020-01-29: 9 mL via INTRA_ARTICULAR

## 2020-02-18 ENCOUNTER — Ambulatory Visit (INDEPENDENT_AMBULATORY_CARE_PROVIDER_SITE_OTHER): Payer: Medicare Other | Admitting: *Deleted

## 2020-02-18 DIAGNOSIS — I63512 Cerebral infarction due to unspecified occlusion or stenosis of left middle cerebral artery: Secondary | ICD-10-CM | POA: Diagnosis not present

## 2020-02-19 LAB — CUP PACEART REMOTE DEVICE CHECK
Date Time Interrogation Session: 20210725233338
Implantable Pulse Generator Implant Date: 20200210

## 2020-02-21 NOTE — Progress Notes (Signed)
[facility] Summary Report / Loop Recorder 

## 2020-03-17 ENCOUNTER — Telehealth: Payer: Self-pay

## 2020-03-17 NOTE — Telephone Encounter (Signed)
Patient called in and states that he wants to cancel all his remotes because insurance is not paying for it anymore and he cannot afford to pay it out of pocket; Is it ok to cancel appts and mark inactive in paceart?

## 2020-03-23 LAB — CUP PACEART REMOTE DEVICE CHECK
Date Time Interrogation Session: 20210825235117
Implantable Pulse Generator Implant Date: 20200210

## 2020-03-24 ENCOUNTER — Ambulatory Visit (INDEPENDENT_AMBULATORY_CARE_PROVIDER_SITE_OTHER): Payer: Medicare Other | Admitting: *Deleted

## 2020-03-24 DIAGNOSIS — I63512 Cerebral infarction due to unspecified occlusion or stenosis of left middle cerebral artery: Secondary | ICD-10-CM | POA: Diagnosis not present

## 2020-03-24 NOTE — Telephone Encounter (Signed)
Returned call to pt. He is concerned about a recent bill he received for $235. Pt is unsure why he is suddenly receiving these bills. He is interested in discussing further with the billing department. Advised will forward message to the billing pool for assistance. Pt in agreement with plan.  Pt aware of upcoming f/u with Dr. Lovena Le on 04/09/20.

## 2020-03-24 NOTE — Telephone Encounter (Signed)
The pt states he wants all his remote cancelled because he can not afford it.

## 2020-03-25 NOTE — Telephone Encounter (Signed)
Billing has appeared to resolve this issue.  No further action needed.

## 2020-03-25 NOTE — Telephone Encounter (Signed)
Returned call to patient about the $235 billing he received.  It appears that the correct insurance was not billed.  I have attached the correct coverage to the charge and it should file in the next couple of days.  I advised him of this and that it looks like [employer] is paying 100% of the allowed for his remote device checks for 2021.  I did advise that it could change for the next policy year.  He understood and thanked me for calling him back.

## 2020-03-26 NOTE — Progress Notes (Signed)
[facility] Summary Report / Loop Recorder 

## 2020-04-09 ENCOUNTER — Encounter: Payer: Self-pay | Admitting: Internal Medicine

## 2020-04-09 ENCOUNTER — Ambulatory Visit: Payer: Medicare Other | Admitting: Internal Medicine

## 2020-04-09 ENCOUNTER — Other Ambulatory Visit: Payer: Self-pay

## 2020-04-09 VITALS — BP 116/70 | HR 65 | Ht 65.0 in | Wt 168.0 lb

## 2020-04-09 DIAGNOSIS — I63512 Cerebral infarction due to unspecified occlusion or stenosis of left middle cerebral artery: Secondary | ICD-10-CM | POA: Diagnosis not present

## 2020-04-09 NOTE — Progress Notes (Signed)
HPI [name] returns today for followup. He is a pleasant64 yo man with a h/o cryptogenic stroke. He is s/p ILR insertion. He has had a nice neuro recovery. He has not had atrial fib. He is back working out with no chest pain or sob or palpitations. No Known Allergies   Current Outpatient Medications  Medication Sig Dispense Refill  . acetaminophen (TYLENOL) 325 MG tablet Take 2 tablets (650 mg total) by mouth every 4 (four) hours as needed for mild pain (or temp > 37.5 C (99.5 F)).    Marland Kitchen amLODipine (NORVASC) 5 MG tablet Take 5 mg by mouth daily.    Marland Kitchen atorvastatin (LIPITOR) 40 MG tablet Take 1 tablet (40 mg total) by mouth daily at 6 PM. 90 tablet 3  . Baclofen 5 MG TABS Take 5 mg by mouth 2 (two) times daily as needed. 180 tablet 3  . clopidogrel (PLAVIX) 75 MG tablet Take 1 tablet (75 mg total) by mouth daily. 90 tablet 3  . diclofenac sodium (VOLTAREN) 1 % GEL Apply 2 g topically 4 (four) times daily. 100 g 5  . FLUoxetine (PROZAC) 20 MG capsule Take 1 capsule (20 mg total) by mouth daily. 30 capsule 1  . multivitamin (ONE-A-DAY MEN'S) TABS tablet Take 1 tablet by mouth daily.     No current facility-administered medications for this visit.     Past Medical History:  Diagnosis Date  . Atypical chest pain 08/10/2013  . Elevated aspartate aminotransferase level 06/16/2015  . Excessive salivation 06/16/2015  . H/O nutritional disorder 06/16/2015  . 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 Screening for prostate cancer 08/21/2014  . 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   Past Surgical History:  Procedure Laterality Date  . HERNIA REPAIR  2006  . left tricep surgery    . LOOP RECORDER INSERTION N/A 09/04/2018   Procedure: LOOP RECORDER INSERTION;  Surgeon: Evans Lance, MD;  Location: [facility];  Service: Cardiovascular;  Laterality: N/A;  . TEE WITHOUT CARDIOVERSION N/A  09/04/2018   Procedure: TRANSESOPHAGEAL ECHOCARDIOGRAM (TEE);  Surgeon: Jerline Pain, MD;  Location: [facility];  Service: Cardiovascular;  Laterality: N/A;     Family History  Problem Relation Age of Onset  . Kidney failure Mother   . Cancer Mother   . Prostate cancer Father   . Cancer Father   . Diabetes Father   . Colon cancer Neg Hx   . Esophageal cancer Neg Hx   . Rectal cancer Neg Hx   . Stomach cancer Neg Hx      Social History   Socioeconomic History  . Marital status: Married    Spouse name: Not on file  . Number of children: Not on file  . Years of education: Not on file  . Highest education level: Not on file  Occupational History  . Not on file  Tobacco Use  . Smoking status: Never Smoker  . Smokeless tobacco: Never Used  Substance and Sexual Activity  . Alcohol use: No  . Drug use: No  . Sexual activity: Not on file  Other Topics Concern  . Not on file  Social History Narrative  . Not on file   Social Determinants of Health   Financial Resource Strain:   . Difficulty of Paying Living Expenses: Not on file  Food Insecurity:   . Worried About  Running Out of Food in the Last Year: Not on file  . Ran Out of Food in the Last Year: Not on file  Transportation Needs:   . Lack of Transportation (Medical): Not on file  . Lack of Transportation (Non-Medical): Not on file  Physical Activity:   . Days of Exercise per Week: Not on file  . Minutes of Exercise per Session: Not on file  Stress:   . Feeling of Stress : Not on file  Social Connections:   . Frequency of Communication with Friends and Family: Not on file  . Frequency of Social Gatherings with Friends and Family: Not on file  . Attends Religious Services: Not on file  . Active Member of Clubs or Organizations: Not on file  . Attends Archivist Meetings: Not on file  . Marital Status: Not on file  Intimate Partner Violence:   . Fear of Current or Ex-Partner: Not on file  .  Emotionally Abused: Not on file  . Physically Abused: Not on file  . Sexually Abused: Not on file     BP 116/70   Pulse 65   Ht 5\' 5"  (1.651 m)   Wt 168 lb (76.2 kg)   SpO2 94%   BMI 27.96 kg/m   Physical Exam:  Well appearing NAD HEENT: Unremarkable Neck:  No JVD, no thyromegally Lymphatics:  No adenopathy Back:  No CVA tenderness Lungs:  Clear with no wheezes HEART:  Regular rate rhythm, no murmurs, no rubs, no clicks Abd:  soft, positive bowel sounds, no organomegally, no rebound, no guarding Ext:  2 plus pulses, no edema, no cyanosis, no clubbing Skin:  No rashes no nodules Neuro:  CN II through XII intact, motor grossly intact  EKG - nsr  DEVICE  Normal device function.  See PaceArt for details. Normal ILR function. No atrial fib.  Assess/Plan: 1. Cryptogenic stroke - he has recovered nicely. The etiology of his stroke is unclear though he notes that he used viagra a day before each. 2. Near syncope - this was likely orthostatic in nature. No brady or tachy on his monitor.  Edward Glass.

## 2020-04-09 NOTE — Patient Instructions (Addendum)

## 2020-04-28 ENCOUNTER — Ambulatory Visit (INDEPENDENT_AMBULATORY_CARE_PROVIDER_SITE_OTHER): Payer: Medicare Other

## 2020-04-28 DIAGNOSIS — I63512 Cerebral infarction due to unspecified occlusion or stenosis of left middle cerebral artery: Secondary | ICD-10-CM | POA: Diagnosis not present

## 2020-04-28 LAB — CUP PACEART REMOTE DEVICE CHECK
Date Time Interrogation Session: 20210925235006
Implantable Pulse Generator Implant Date: 20200210

## 2020-05-01 NOTE — Progress Notes (Signed)
[facility] Summary Report / Loop Recorder 

## 2020-05-14 ENCOUNTER — Other Ambulatory Visit: Payer: Self-pay

## 2020-05-14 ENCOUNTER — Ambulatory Visit: Payer: Medicare Other | Admitting: Orthopedic Surgery

## 2020-05-14 DIAGNOSIS — M19011 Primary osteoarthritis, right shoulder: Secondary | ICD-10-CM

## 2020-05-21 ENCOUNTER — Encounter: Payer: Self-pay | Admitting: Orthopedic Surgery

## 2020-05-21 ENCOUNTER — Ambulatory Visit (INDEPENDENT_AMBULATORY_CARE_PROVIDER_SITE_OTHER): Payer: Medicare Other

## 2020-05-21 DIAGNOSIS — M19011 Primary osteoarthritis, right shoulder: Secondary | ICD-10-CM | POA: Diagnosis not present

## 2020-05-21 DIAGNOSIS — I63512 Cerebral infarction due to unspecified occlusion or stenosis of left middle cerebral artery: Secondary | ICD-10-CM

## 2020-05-21 LAB — CUP PACEART REMOTE DEVICE CHECK
Date Time Interrogation Session: 20211026235813
Implantable Pulse Generator Implant Date: 20200210

## 2020-05-21 MED ORDER — BUPIVACAINE HCL 0.5 % IJ SOLN
9.0000 mL | INTRAMUSCULAR | Status: AC | PRN
  Administered 2020-05-21: 9 mL via INTRA_ARTICULAR

## 2020-05-21 MED ORDER — METHYLPREDNISOLONE ACETATE 40 MG/ML IJ SUSP
40.0000 mg | INTRAMUSCULAR | Status: AC | PRN
  Administered 2020-05-21: 40 mg via INTRA_ARTICULAR

## 2020-05-21 MED ORDER — LIDOCAINE HCL 1 % IJ SOLN
5.0000 mL | INTRAMUSCULAR | Status: AC | PRN
  Administered 2020-05-21: 5 mL

## 2020-05-21 NOTE — Progress Notes (Signed)
Office Visit Note   Patient: Edward Glass           Date of Birth: Oct 26, 1955           MRN: [id number] Visit Date: 05/14/2020 Requested by: Katherina Mires, MD [address] [place],  [place] [address] PCP: Katherina Mires, MD  Subjective: Chief Complaint  Patient presents with   Right Shoulder - Pain    HPI: Edward Glass is a 64 year old patient with right shoulder arthritis.  Had injection 721 with good relief.  Recently the pain recurred.  Does have a history of stroke on that side.  Not too much function in the right hand or elbow but does have some shoulder function.  However that function is painful due to the arthritis in his shoulder joint.  He would like to have repeat injection today.              ROS: All systems reviewed are negative as they relate to the chief complaint within the history of present illness.  Patient denies  fevers or chills.   Assessment & Plan: Visit Diagnoses:  1. Primary osteoarthritis, right shoulder     Plan: Impression is right shoulder glenohumeral arthritis.  Plan is glenohumeral joint injection.  Would hesitate to do any more injections this year.  Patient tolerated the procedure well.  Follow-up as needed.  Follow-Up Instructions: Return if symptoms worsen or fail to improve.   Orders:  No orders of the defined types were placed in this encounter.  No orders of the defined types were placed in this encounter.     Procedures: Large Joint Inj: R glenohumeral on 05/21/2020 11:23 PM Indications: diagnostic evaluation and pain Details: 18 G 1.5 in needle, posterior approach  Arthrogram: No  Medications: 9 mL bupivacaine 0.5 %; 40 mg methylPREDNISolone acetate 40 MG/ML; 5 mL lidocaine 1 % Outcome: tolerated well, no immediate complications Procedure, treatment alternatives, risks and benefits explained, specific risks discussed. Consent was given by the patient. Immediately prior to procedure a time out was called to  verify the correct patient, procedure, equipment, support staff and site/side marked as required. Patient was prepped and draped in the usual sterile fashion.       Clinical Data: No additional findings.  Objective: Vital Signs: There were no vitals taken for this visit.  Physical Exam:   Constitutional: Patient appears well-developed HEENT:  Head: Normocephalic Eyes:EOM are normal Neck: Normal range of motion Cardiovascular: Normal rate Pulmonary/chest: Effort normal Neurologic: Patient is alert Skin: Skin is warm Psychiatric: Patient has normal mood and affect    Ortho Exam: Ortho exam demonstrates some pain with range of motion of the right shoulder.  Right hand is less than functional due to his stroke.  Radial pulse intact bilaterally.  Does have coarse grinding and crepitus with passive range of motion of the shoulder.  Specialty Comments:  No specialty comments available.  Imaging: CUP PACEART REMOTE DEVICE CHECK  Result Date: 05/21/2020 ILR summary report received. Battery status OK. Normal device function. No new symptom, tachy, brady, or pause episodes. AF 6 mins, previously reviewed as false.  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   Heart palpitations 06/16/2015   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   Hypogonadism male 08/21/2014   Screening for prostate cancer 08/21/2014   Atypical chest pain  08/10/2013   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   Past Medical History:  Diagnosis Date   Atypical chest pain 08/10/2013   Elevated aspartate aminotransferase level 06/16/2015   Excessive salivation 06/16/2015   H/O nutritional disorder 06/16/2015   Hypogonadism male 08/21/2014   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   Screening for prostate cancer 08/21/2014   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    Family History  Problem Relation Age of Onset   Kidney failure Mother    Cancer Mother    Prostate cancer Father    Cancer Father    Diabetes Father    Colon cancer Neg Hx    Esophageal cancer Neg Hx    Rectal cancer Neg Hx    Stomach cancer Neg Hx     Past Surgical History:  Procedure Laterality Date   HERNIA REPAIR  2006   left tricep surgery     LOOP RECORDER INSERTION N/A 09/04/2018   Procedure: LOOP RECORDER INSERTION;  Surgeon: Evans Lance, MD;  Location: [facility];  Service: Cardiovascular;  Laterality: N/A;   TEE WITHOUT CARDIOVERSION N/A 09/04/2018   Procedure: TRANSESOPHAGEAL ECHOCARDIOGRAM (TEE);  Surgeon: Jerline Pain, MD;  Location: [facility] END[facility];  Service: Cardiovascular;  Laterality: N/A;   Social History   Occupational History   Not on file  Tobacco Use   Smoking status: Never Smoker   Smokeless tobacco: Never Used  Substance and Sexual Activity   Alcohol use: No   Drug use: No   Sexual activity: Not on file

## 2020-05-26 NOTE — Progress Notes (Signed)
[facility] Summary Report / Loop Recorder 

## 2020-06-22 LAB — CUP PACEART REMOTE DEVICE CHECK
Date Time Interrogation Session: 20211126230015
Implantable Pulse Generator Implant Date: 20200210

## 2020-06-23 ENCOUNTER — Ambulatory Visit (INDEPENDENT_AMBULATORY_CARE_PROVIDER_SITE_OTHER): Payer: Medicare Other

## 2020-06-23 DIAGNOSIS — I63512 Cerebral infarction due to unspecified occlusion or stenosis of left middle cerebral artery: Secondary | ICD-10-CM | POA: Diagnosis not present

## 2020-06-30 NOTE — Progress Notes (Signed)
[facility] Summary Report / Loop Recorder 

## 2020-07-09 ENCOUNTER — Telehealth: Payer: Self-pay | Admitting: Adult Health

## 2020-07-09 MED ORDER — BACLOFEN 5 MG PO TABS: 5.0000 mg | ORAL_TABLET | Freq: Two times a day (BID) | ORAL | 1 refills | Status: DC | PRN

## 2020-07-09 NOTE — Addendum Note (Signed)
Addended by: Oliver Hum S on: 07/09/2020 12:01 PM   Modules accepted: Orders

## 2020-07-09 NOTE — Telephone Encounter (Signed)
Pt is requesting a refill for Baclofen 5 MG TABS.  Pharmacy: [id number] - [place], [place]

## 2020-07-15 ENCOUNTER — Ambulatory Visit: Payer: Medicare Other | Admitting: Adult Health

## 2020-07-15 ENCOUNTER — Other Ambulatory Visit: Payer: Self-pay

## 2020-07-15 ENCOUNTER — Encounter: Payer: Self-pay | Admitting: Adult Health

## 2020-07-15 VITALS — BP 127/77 | HR 63 | Ht 65.0 in | Wt 170.0 lb

## 2020-07-15 DIAGNOSIS — E785 Hyperlipidemia, unspecified: Secondary | ICD-10-CM | POA: Diagnosis not present

## 2020-07-15 DIAGNOSIS — I10 Essential (primary) hypertension: Secondary | ICD-10-CM | POA: Diagnosis not present

## 2020-07-15 DIAGNOSIS — M19011 Primary osteoarthritis, right shoulder: Secondary | ICD-10-CM

## 2020-07-15 DIAGNOSIS — Z8673 Personal history of transient ischemic attack (TIA), and cerebral infarction without residual deficits: Secondary | ICD-10-CM | POA: Diagnosis not present

## 2020-07-15 MED ORDER — BACLOFEN 5 MG PO TABS: 5.0000 mg | ORAL_TABLET | Freq: Four times a day (QID) | ORAL | 3 refills | Status: DC | PRN

## 2020-07-15 NOTE — Progress Notes (Signed)
[facility] [address] [place]. [place] [address] [phone]       STROKE FOLLOW UP NOTE  [name] Date of Birth:  [birth year] Medical Record Number:  [id number]   Reason for Referral: Cryptogenic stroke follow up [place] provider: Dr. Leonie Man PCP: Katherina Mires, MD     CHIEF COMPLAINT:  Chief Complaint  Patient presents with  . Follow-up    Tx rm, alone, pt states he is doing well, taking baclofen tid, c/o right side stiffness     HPI:  Today, 07/15/2020, Edward Glass returns for 57-month stroke follow-up unaccompanied. 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 Has been using baclofen for spasticity 5mg  three times daily but does experience increased stiffness upon awakening and towards the end of the day. Feels as though speech has been improving. Denies new or worsening stroke/TIA symptoms. At prior visit, complained of right shoulder pain worsened post stroke. Evaluated by orthopedics with note personally reviewed and x-ray revealing severe end-stage OA with progression compared to prior imaging in 08/2018.  He has since received 2 injections with benefit.  Remains on Plavix and atorvastatin without side effects for secondary stroke prevention. Blood pressure today 127/77. Loop recorder has not shown atrial fibrillation thus far.  Previously on Prozac for depression/anxiety post stroke but he has since been able to discontinue this without difficulty.  No further concerns at this time.     History provided for reference purposes only Update 01/10/2020 JM: [name] returns for follow-up regarding stroke in 08/2018.  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  Denies new stroke/TIA symptoms.  Currently ambulating with a quad cane and denies any recent falls.  Reports typically caring cane at his side "just in case" but he is here to walk without it.  Initiated baclofen 5 mg twice  daily as needed for spasticity.  Difficulty tolerating daytime dose due to increased fatigue has continued 5 mg dosage at night with only mild benefit.  He questions benefit of restarting Botox as he only received 2 rounds.  He does report right shoulder pain which is chronic but worsened post stroke.  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  Continues on clopidogrel and atorvastatin for secondary stroke prevention.  Prior lipid panel on 10/09/2019 by PCP showed LDL 51.  Blood pressure today 122/77.  Loop recorder is not shown atrial fibrillation thus far.  No further concerns at this time.  Update 09/06/2019 JM: [name] is a 64 year old male who is being seen today for cryptogenic stroke follow-up.  He was previously seen via virtual visit on 11/08/2018 and has not previously followed up as recommended.  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  He continues to ambulate with a Rollator walker and denies any recent falls.  Greatest concern today is including continued right-sided spasticity.  Trial of Botox injections but denies benefit as well as trial of Zanaflex but patient self discontinued due to reported side effect of upset stomach.  Completed 3 months DAPT and continues on Plavix alone without bleeding or bruising.  Continues on atorvastatin without myalgias.  Blood pressure today 116/78.  Continues to follow with PCP regularly for HTN and HLD management/monitoring.  He continues  on Prozac for post stroke depression and is requesting refill.  Loop recorder has not shown atrial fibrillation thus far.  Denies new or worsening stroke/TIA symptoms.  Initial visit via virtual 11/08/2018: He has been stable from a stroke standpoint with residual deficits  of aphasia, cognitive deficits and right hemiparesis but does endorse improvement.  He continues to participate at neuro rehab PT/OT/ST along with continuing exercises at home.  He is currently ambulating with a quad cane and denies any recent falls.  He does need some assistance with bathing and dressing but continues to be able to do more on his own each day.  He no longer has any difficulties with swallowing and is currently on a regular diet.  He does endorse increased salivation.  He continues on aspirin and Plavix without side effects of bleeding or bruising.  Continues on atorvastatin without side effects myalgias.  Blood pressures not routinely monitored at home and encourage daughter and patient to obtain cough to start monitoring.  He does endorse occasional depression difficulties such as feeling down or increased anxiety.  It was recommended to initiate Prozac during hospital admission but states once he went home, he did not continue as he felt it was not needed.  Per review of loop recorder, no report of atrial fibrillation found but per patient and daughter, they were contacted by cardiology stating arrhythmia had been found and is in the process of scheduling visit with cardiology.  No further concerns at this time.  Denies new or worsening stroke/TIA symptoms.  Stroke admission 08/30/2018: [name] is a 64 year old male who presented to [facility] ED with right-sided weakness and aphasia.   CT head reviewed which showed age indeterminate small vessel ischemia in the left BG.  CTA showed possible left M1 occlusion versus severe stenosis.  CT perfusion no infarct for but large penumbra.  MRI  brain reviewed and showed left MCA patchy infarcts mostly concentrated in the left BG. He arrived greater than 24h from time since last well, therefore no acute stroke interventions done.  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  He unfortunately had neurological  worsening with severe aphasia and dense right hemiplegia with extension of deficits on 09/01/2018.  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   When pts dtrs arrive, they both explain that in 2016 after tricep repair surgery they noted a possible arrhythmia and placed him on holter monitor for 30d, but was unrevealing.  2D echo showed an EF of 55 to 60%.  TCD bubble study negative for PFO.  Lower extremity venous Dopplers negative for DVT.  TEE showed small PFO/bubble crossover noted during Valsalva which was not felt to be clinically significant along with no evidence of cardiac thrombus therefore loop recorder placed.   Recommended DAPT for 3 months then Plavix alone due to severe left M1 stenosis.  HTN stable recommended long-term BP goal 1 30-1 50 due left M1 stenosis.  HLD 104 and initiated atorvastatin 40 mg daily.  Other stroke risk factors include family history of stroke but no personal history of stroke.  Other active problems including reactive depression as he previously worked as a Aeronautical engineer and overall healthy and was devastated with diagnosis and residual deficits.  Initiated Prozac on 08/31/2018.  He had residual deficits of right hemiparesis, dysarthria and mixed aphasia and was discharged to [facility] for ongoing therapy.  ROS:   14 system review of systems performed and negative with exception of speech difficulty, weakness, muscle spasticity  PMH:  Past Medical History:  Diagnosis Date  . Atypical chest pain 08/10/2013  . Elevated aspartate aminotransferase level 06/16/2015  . Excessive salivation 06/16/2015  . H/O nutritional disorder 06/16/2015  . 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 Screening for prostate cancer 08/21/2014  . 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 HERNIA REPAIR  2006  . left tricep surgery     . LOOP RECORDER INSERTION N/A 09/04/2018   Procedure: LOOP RECORDER INSERTION;  Surgeon: Evans Lance, MD;  Location: [facility];  Service: Cardiovascular;  Laterality: N/A;  . TEE WITHOUT CARDIOVERSION N/A 09/04/2018   Procedure: TRANSESOPHAGEAL ECHOCARDIOGRAM (TEE);  Surgeon: Jerline Pain, MD;  Location: [facility] ENDOSCOPY;  Service: Cardiovascular;  Laterality: N/A;    Social History:  Social History   Socioeconomic History  . Marital status: Married    Spouse name: Not on file  . Number of children: Not on file  . Years of education: Not on file  . Highest education level: Not on file  Occupational History  . Not on file  Tobacco Use  . Smoking status: Never Smoker  . Smokeless tobacco: Never Used  Substance and Sexual Activity  . Alcohol use: No  . Drug use: No  . Sexual activity: Not on file  Other Topics Concern  . Not on file  Social History Narrative  . Not on file   Social Determinants of Health   Financial Resource Strain: Not on file  Food Insecurity: Not on file  Transportation Needs: Not on file  Physical Activity: Not on file  Stress: Not on file  Social Connections: Not on file  Intimate Partner Violence: Not on file    Family History:  Family History  Problem Relation Age of Onset  . Kidney failure Mother   . Cancer Mother   . Prostate cancer Father   . Cancer Father   . Diabetes Father   . Colon cancer Neg Hx   . Esophageal cancer Neg Hx   . Rectal cancer Neg Hx   . Stomach cancer Neg Hx     Medications:   Current Outpatient Medications on File Prior to Visit  Medication Sig Dispense Refill  . acetaminophen (TYLENOL) 325 MG tablet Take 2 tablets (650 mg total) by mouth every 4 (four) hours as needed for mild pain (or temp > 37.5 C (99.5 F)).    Marland Kitchen amLODipine (NORVASC) 5 MG tablet Take 5 mg by mouth daily.    Marland Kitchen atorvastatin (LIPITOR) 40 MG tablet Take 1 tablet (40 mg total) by mouth daily at 6 PM. 90 tablet 3  . clopidogrel (PLAVIX)  75 MG tablet Take 1 tablet (75 mg total) by mouth daily. 90 tablet 3  . diclofenac sodium (VOLTAREN) 1 % GEL Apply 2 g topically 4 (four) times daily. 100 g 5  . FLUoxetine (PROZAC) 20 MG capsule Take 1 capsule (20 mg total) by mouth daily. 30 capsule 1  . multivitamin (ONE-A-DAY MEN'S) TABS tablet Take 1 tablet by mouth daily.     No current facility-administered medications on file prior to visit.    Allergies:  No Known Allergies   Physical Exam  Vitals:   07/15/20 1035  BP: 127/77  Pulse: 63  Weight: 170 lb (77.1 kg)  Height: 5\' 5"  (1.651 m)  Body mass index is 28.29 kg/m. No exam data present  General: well developed, well nourished, pleasant middle-aged male, seated, in no evident distress Head: head normocephalic and atraumatic.   Neck: supple with no carotid or supraclavicular bruits Cardiovascular: regular rate and rhythm, no murmurs Musculoskeletal: no deformity Skin:  no rash/petichiae Vascular:  Normal pulses all extremities   Neurologic Exam Mental Status: Awake and fully alert. Mild dysarthria with hypophonia. Oriented to place and time. Recent and remote memory intact. Attention span, concentration and fund of knowledge appropriate. Mood and affect appropriate.  Cranial Nerves: Pupils equal, briskly reactive to light. Extraocular movements full without nystagmus. Visual fields full to confrontation. Hearing intact. Facial sensation intact. Face, tongue, palate moves normally and symmetrically.  Motor: Full strength in all tested extremities except increased tone RUE and [name] Sensory.: intact to touch , pinprick , position and vibratory sensation.  Coordination: Rapid alternating movements normal in all extremities. Finger-to-nose and heel-to-shin performed accurately on left side.  Unable to adequately assess right side due to increased tone Gait and Station: Arises from chair without difficulty. Stance is normal. Gait demonstrates  slow hemiplegic gait and  stiffened right leg with use of cane. Reflexes: 2+ RUE and [name] and 1+ left side. Toes downgoing.        ASSESSMENT: Edward Glass is a 64 y.o. year old male here with left MCA patchy infarct secondary to left M1 occlusion with embolic pattern due to unclear etiology on 08/30/2018 and neurological worsening with extension of deficits on 09/01/2018.  Loop recorder placed which has not shown atrial fibrillation thus far.  Vascular risk factors include intracranial stenosis, HLD and HTN.  Residual stroke deficits of right upper and lower spasticity and dysarthria with ongoing improvement    PLAN:  1. Cryptogenic left MCA infarct:  a. Residual RUE and [name] spasticity, gait impairment and dysarthria.  Due to worsening spasticity at night and in the morning upon awakening, recommend continued baclofen 5 mg a.m., 5 mg afternoon and 10 mg at bedtime (previously 5 mg at bedtime).  He is not interested in follow-up with PMR for further Botox treatments.  Encourage continued exercise at home b. Continue clopidogrel 75 mg daily  and atorvastatin for secondary stroke prevention.  c. Loop recorder negative for atrial fibrillation thus far -continuously monitored by cardiology d. Discussed secondary stroke prevention measures and importance of close PCP follow-up for aggressive stroke risk factor management 2. Depression, post stroke: Resolved.  No longer on Prozac 3. HTN: BP goal<130/90. Stable on amlodipine per PCP 4. HLD: LDL goal<70. On atorvastatin 40 mg daily per PCP.  No recent lipid panel - will obtain today 5. Right shoulder OA: X-ray 01/24/2020 by orthopedics showed severe end-stage OA with progression compared to prior imaging.  Receiving cortisone injection with benefit.  He is not interested in surgical options at this time.  Advised to continue to follow with orthopedics for ongoing monitoring management   Follow up in 6 months or call earlier if needed   CC: Antony Contras, MD Katherina Mires,  MD   I spent 39 minutes of face-to-face and non-face-to-face time with patient.  This included previsit chart review, lab review, study review, order entry, electronic health record documentation, patient education and discussion regarding cryptogenic stroke and review of loop recorder, residual deficits and further treatment options of spasticity, resolution of poststroke depression, evaluation by Ortho for right shoulder OA, importance of managing stroke risk factors and answered all other questions to patient satisfaction  Frann Rider, AGNP-BC  [facility] [address] [place],  [address]  Phone [phone] Fax [phone] Note: This document was prepared with digital dictation and possible smart phrase technology. Any transcriptional errors that result from this process are unintentional.    6

## 2020-07-15 NOTE — Progress Notes (Signed)
I agree with the above plan 

## 2020-07-15 NOTE — Patient Instructions (Signed)
Recommend increasing baclofen dosage - continue baclofen 5mg  AM and afternoon and take 10 mg (2 tabs) at bedtime  Continue clopidogrel 75 mg daily  and lipitor  for secondary stroke prevention  Check cholesterol levels today  Continue to follow up with PCP regarding cholesterol and blood pressure management  Maintain strict control of hypertension with blood pressure goal below 130/90 and cholesterol with LDL cholesterol (bad cholesterol) goal below 70 mg/dL.      Followup in the future with me in 6 months or call earlier if needed     Thank you for coming to see [place] at [facility]. I hope we have been able to provide you high quality care today.  You may receive a patient satisfaction survey over the next few weeks. We would appreciate your feedback and comments so that we may continue to improve ourselves and the health of our patients.

## 2020-07-16 LAB — LIPID PANEL
Chol/HDL Ratio: 2.4 ratio (ref 0.0–5.0)
Cholesterol, Total: 138 mg/dL (ref 100–199)
HDL: 58 mg/dL (ref >39)
LDL Chol Calc (NIH): 64 mg/dL (ref 0–99)
Triglycerides: 83 mg/dL (ref 0–149)
VLDL Cholesterol Cal: 16 mg/dL (ref 5–40)

## 2020-07-21 ENCOUNTER — Telehealth: Payer: Self-pay

## 2020-07-21 NOTE — Telephone Encounter (Signed)
-----   Message from Ihor Austin, NP sent at 07/16/2020  8:51 AM EST ----- Please advise patient that recent lipid panel shows satisfactory LDL at 64.  Please continue current regimen.  Thank you.

## 2020-07-21 NOTE — Telephone Encounter (Signed)
Mychart message sent.

## 2020-07-23 LAB — CUP PACEART REMOTE DEVICE CHECK
Date Time Interrogation Session: 20211227230738
Implantable Pulse Generator Implant Date: 20200210

## 2020-07-26 DIAGNOSIS — U071 COVID-19: Secondary | ICD-10-CM

## 2020-07-26 HISTORY — DX: COVID-19: U07.1

## 2020-07-28 ENCOUNTER — Ambulatory Visit (INDEPENDENT_AMBULATORY_CARE_PROVIDER_SITE_OTHER): Payer: Medicare Other

## 2020-07-28 DIAGNOSIS — I63512 Cerebral infarction due to unspecified occlusion or stenosis of left middle cerebral artery: Secondary | ICD-10-CM | POA: Diagnosis not present

## 2020-07-31 ENCOUNTER — Other Ambulatory Visit: Payer: Medicare Other

## 2020-07-31 DIAGNOSIS — Z20822 Contact with and (suspected) exposure to covid-19: Secondary | ICD-10-CM

## 2020-08-03 LAB — NOVEL CORONAVIRUS, NAA: SARS-CoV-2, NAA: DETECTED — AB

## 2020-08-04 ENCOUNTER — Telehealth: Payer: Self-pay | Admitting: Unknown Physician Specialty

## 2020-08-04 NOTE — Telephone Encounter (Signed)
Called to discuss with patient about COVID-19 symptoms and the use of one of the available treatments for those with mild to moderate Covid symptoms and at a high risk of hospitalization.  Pt appears to qualify for outpatient treatment due to co-morbid conditions and/or a member of an at-risk group in accordance with the FDA Emergency Use Authorization.    Symptom onset: ? Vaccinated: yes  Booster?  Qualifiers: htn, age  Unable to reach pt - Unable to leave message   Kathrine Haddock

## 2020-08-12 NOTE — Progress Notes (Signed)
[facility] Summary Report / Loop Recorder 

## 2020-08-20 ENCOUNTER — Telehealth: Payer: Self-pay | Admitting: Adult Health

## 2020-08-20 NOTE — Telephone Encounter (Signed)
Pt. states Baclofen 5 MG TABS is not at [facility]. Please advise.

## 2020-08-20 NOTE — Telephone Encounter (Addendum)
[place], spoke with [facility] who stated they have a Rx on file from Dec 2021, can refill Rx for patient. Called patient and advised him of above conversation. Patient verbalized understanding, appreciation.

## 2020-08-22 ENCOUNTER — Ambulatory Visit (INDEPENDENT_AMBULATORY_CARE_PROVIDER_SITE_OTHER): Payer: Medicare Other

## 2020-08-22 DIAGNOSIS — I63512 Cerebral infarction due to unspecified occlusion or stenosis of left middle cerebral artery: Secondary | ICD-10-CM | POA: Diagnosis not present

## 2020-08-22 LAB — CUP PACEART REMOTE DEVICE CHECK
Date Time Interrogation Session: 20220127231119
Implantable Pulse Generator Implant Date: 20200210

## 2020-08-30 NOTE — Progress Notes (Signed)
[facility] Summary Report / Loop Recorder 

## 2020-09-22 ENCOUNTER — Ambulatory Visit (INDEPENDENT_AMBULATORY_CARE_PROVIDER_SITE_OTHER): Payer: Medicare Other

## 2020-09-22 DIAGNOSIS — I639 Cerebral infarction, unspecified: Secondary | ICD-10-CM

## 2020-09-22 LAB — CUP PACEART REMOTE DEVICE CHECK
Date Time Interrogation Session: 20220228001430
Implantable Pulse Generator Implant Date: 20200210

## 2020-09-30 NOTE — Progress Notes (Signed)
[facility] Summary Report / Loop Recorder 

## 2020-10-23 ENCOUNTER — Ambulatory Visit (INDEPENDENT_AMBULATORY_CARE_PROVIDER_SITE_OTHER): Payer: Medicare Other

## 2020-10-23 DIAGNOSIS — I63512 Cerebral infarction due to unspecified occlusion or stenosis of left middle cerebral artery: Secondary | ICD-10-CM | POA: Diagnosis not present

## 2020-10-23 LAB — CUP PACEART REMOTE DEVICE CHECK
Date Time Interrogation Session: 20220331011457
Implantable Pulse Generator Implant Date: 20200210

## 2020-10-31 ENCOUNTER — Other Ambulatory Visit: Payer: Self-pay

## 2020-10-31 ENCOUNTER — Ambulatory Visit: Payer: Medicare Other | Admitting: Orthopedic Surgery

## 2020-10-31 DIAGNOSIS — M19011 Primary osteoarthritis, right shoulder: Secondary | ICD-10-CM

## 2020-11-02 ENCOUNTER — Encounter: Payer: Self-pay | Admitting: Orthopedic Surgery

## 2020-11-02 NOTE — Progress Notes (Signed)
Office Visit Note   Patient: Edward Glass           Date of Birth: 07-09-1956           MRN: [id number] Visit Date: 10/31/2020 Requested by: Katherina Mires, MD [place],  [place] [address] PCP: Katherina Mires, MD  Subjective: Chief Complaint  Patient presents with  . Right Shoulder - Follow-up    HPI: Edward Glass is a 65 year old patient with significant right shoulder pain.  Had reasonable relief from intra-articular injection several months ago.  Here to consider another injection versus other treatment options.  Patient has had a stroke affecting the right side but he has regained a significant portion of his right upper extremity functionality.  Reports pain but also more importantly diminished range of motion from right shoulder arthritis.  He is on Plavix due to the stroke.              ROS: All systems reviewed are negative as they relate to the chief complaint within the history of present illness.  Patient denies  fevers or chills.   Assessment & Plan: Visit Diagnoses:  1. Primary osteoarthritis, right shoulder     Plan: Impression is right shoulder arthritis with limitation of motion and seemingly well-preserved rotator cuff strength.  Plan at this time is thin cut CT scan to evaluate glenoid deformity as well as get a general view of the rotator cuff tendons.  Edward Glass would like to have more range of motion of the shoulder but I think he is very keen on resuming weightlifting.  If his shoulder replacement becomes a reverse replacement then 20 pound lifetime lifting limit is discussed.  With standard shoulder replacement which may be possible for him as well he could consider some light weight lifting but nothing really shoulder related.  We will see what the scan shows and then assess from there.  Follow-Up Instructions: Return for after MRI.   Orders:  Orders Placed This Encounter  Procedures  . CT SHOULDER RIGHT WO CONTRAST   No orders of  the defined types were placed in this encounter.     Procedures: No procedures performed   Clinical Data: No additional findings.  Objective: Vital Signs: There were no vitals taken for this visit.  Physical Exam:   Constitutional: Patient appears well-developed HEENT:  Head: Normocephalic Eyes:EOM are normal Neck: Normal range of motion Cardiovascular: Normal rate Pulmonary/chest: Effort normal Neurologic: Patient is alert Skin: Skin is warm Psychiatric: Patient has normal mood and affect    Ortho Exam: Ortho exam demonstrates full active and passive range of motion of the left shoulder.  On the right-hand side he has passively range of motion 15/60/80/with good deltoid strength as well as good rotator cuff strength to infraspinatus supraspinatus and subscap muscle testing.  He also has pretty reasonable grip strength EPL FPL interosseous wrist flexion wrist extension biceps triceps strength on the right-hand side which is only slightly less than the left-hand side.  Radial pulse is intact on the right-hand side  Specialty Comments:  No specialty comments available.  Imaging: No results found.   PMFS History: Patient Active Problem List   Diagnosis Date Noted  . Slow transit constipation   . Benign prostatic hyperplasia with urinary retention   . Spastic hemiparesis of right dominant side ([place])   . Chronic right shoulder pain   . Primary osteoarthritis of right shoulder   . Left basal ganglia embolic stroke ([place])  09/05/2018  . CVA (cerebral vascular accident) ([place]) 08/31/2018  . Elevated aspartate aminotransferase level 06/16/2015  . H/O nutritional disorder 06/16/2015  . Excessive salivation 06/16/2015  . 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 Heart palpitations 06/16/2015  . 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 Screening for prostate cancer 08/21/2014  . Atypical chest pain 08/10/2013  . 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 Atypical chest pain 08/10/2013  . Elevated aspartate aminotransferase level 06/16/2015  . Excessive salivation 06/16/2015  . H/O nutritional disorder 06/16/2015  . 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 Screening for prostate cancer 08/21/2014  . 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 Kidney failure Mother   . Cancer Mother   . Prostate cancer Father   . Cancer Father   . Diabetes Father   . Colon cancer Neg Hx   . Esophageal cancer Neg Hx   . Rectal cancer Neg Hx   . Stomach cancer Neg Hx     Past Surgical History:  Procedure Laterality Date  . HERNIA REPAIR  2006  . left tricep surgery    . LOOP RECORDER INSERTION N/A 09/04/2018   Procedure: LOOP RECORDER INSERTION;  Surgeon: Evans Lance, MD;  Location: [facility];  Service: Cardiovascular;  Laterality: N/A;  . TEE WITHOUT CARDIOVERSION N/A 09/04/2018   Procedure: TRANSESOPHAGEAL ECHOCARDIOGRAM (TEE);  Surgeon: Jerline Pain, MD;  Location: [facility] ENDOSCOPY;  Service: Cardiovascular;  Laterality: N/A;   Social History   Occupational History  . Not on file  Tobacco Use  . Smoking status: Never Smoker  . Smokeless tobacco: Never Used  Substance and Sexual Activity  . Alcohol use: No  . Drug use: No  . Sexual activity: Not on file

## 2020-11-05 ENCOUNTER — Telehealth: Payer: Self-pay | Admitting: Orthopedic Surgery

## 2020-11-05 NOTE — Progress Notes (Signed)
[facility] Summary Report / Loop Recorder 

## 2020-11-05 NOTE — Telephone Encounter (Signed)
Patient called and wanted to set up shoulder replacement.  Patient is scheduled for scan at [facility] on 11-14-20.  We are holding a date of May 24th for this case if approved.  Please provide surgery sheet. Thanks.

## 2020-11-10 NOTE — Telephone Encounter (Signed)
Done thx

## 2020-11-11 ENCOUNTER — Telehealth: Payer: Self-pay | Admitting: Adult Health

## 2020-11-11 MED ORDER — CLOPIDOGREL BISULFATE 75 MG PO TABS: 75.0000 mg | ORAL_TABLET | Freq: Every day | ORAL | 0 refills | Status: DC

## 2020-11-11 NOTE — Telephone Encounter (Signed)
Pt request refill clopidogrel (PLAVIX) 75 MG tablet at [id number]

## 2020-11-14 ENCOUNTER — Ambulatory Visit
Admission: RE | Admit: 2020-11-14 | Discharge: 2020-11-14 | Disposition: A | Discharge status: Home / Self Care | Payer: Medicare Other | Source: Ambulatory Visit | Attending: Orthopedic Surgery | Admitting: Orthopedic Surgery

## 2020-11-14 DIAGNOSIS — M19011 Primary osteoarthritis, right shoulder: Secondary | ICD-10-CM

## 2020-11-17 ENCOUNTER — Other Ambulatory Visit: Payer: Self-pay

## 2020-11-18 ENCOUNTER — Encounter: Payer: Self-pay | Admitting: Gastroenterology

## 2020-11-18 ENCOUNTER — Ambulatory Visit: Payer: Medicare Other | Admitting: Gastroenterology

## 2020-11-18 VITALS — BP 120/78 | HR 65 | Ht 65.0 in | Wt 167.0 lb

## 2020-11-18 DIAGNOSIS — Z7902 Long term (current) use of antithrombotics/antiplatelets: Secondary | ICD-10-CM | POA: Diagnosis not present

## 2020-11-18 DIAGNOSIS — Z86010 Personal history of colonic polyps: Secondary | ICD-10-CM

## 2020-11-18 DIAGNOSIS — Z01818 Encounter for other preprocedural examination: Secondary | ICD-10-CM | POA: Diagnosis not present

## 2020-11-18 NOTE — Patient Instructions (Addendum)
If you are age 65 or older, your body mass index should be between 23-30. Your Body mass index is 27.79 kg/m. If this is out of the aforementioned range listed, please consider follow up with your Primary Care Provider.   You will be due for a recall colonoscopy in 06-2022. We will send you a reminder in the mail when it gets closer to that time.    Thank you for entrusting me with your care and for choosing [facility], Dr. Hungerford Cellar

## 2020-11-18 NOTE — Progress Notes (Signed)
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 We have not seen him since his last colonoscopy in December 2016.  The patient's last colonoscopy was in December 2016, at which time he had 2 less than 1 cm adenomas removed and internal hemorrhoids.  At that time based on national guidelines it was recommended he have a follow-up colonoscopy in 5 years.  He denies any problems with his bowel habits, regular stools without any blood.  No routine abdominal pains.  Weight is stable.  No family history of colon cancer.  He had a stroke in 2020 for which he was admitted and underwent a work-up.  He walks with a cane for ambulation but has recovered okay otherwise, he reports.  He is on Plavix indefinitely in this light.  He is scheduled for shoulder surgery in a few months.  He denies any cardiopulmonary symptoms that bother him.  He denies any other gastrointestinal complaints at this time otherwise.   Colonoscopy 07/18/2015 - COLON FINDINGS: The bowel preparation was only fair on intubation but cleared with adequate views following lavage. The colon was extremely tortous. There was a 94mm sessile polyp in the cecum removed with cold snare. There was a 7-26mm sessile polyp in the ascending colon removed with cold snare. The remainder of the colon was normal. Retroflexed views revealed internal hemorrhoids.   Path c/w adenomas, now due 06/2022   Echo 09/04/18 - TEE - EF 55-60%, valves okay   Past Medical History:  Diagnosis Date  . Atypical chest pain 08/10/2013  . BPH (benign prostatic hyperplasia)   . COVID-19 07/2020  . CVA (cerebral vascular accident) ([place]) 08/2018   spastic hemiparesis of right dominant side  . Dysphagia   . Elevated aspartate aminotransferase level 06/16/2015  . Excessive salivation 06/16/2015  . H/O nutritional disorder 06/16/2015  . H/O vitamin D deficiency   .  History of loop recorder 2020  . Hx of adenomatous colonic polyps 2016  . Hyperlipidemia   . Hypertension   . 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 Screening for prostate cancer 08/21/2014  . 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 COLONOSCOPY    . HERNIA REPAIR  2006  . left tricep surgery    . LOOP RECORDER INSERTION N/A 09/04/2018   Procedure: LOOP RECORDER INSERTION;  Surgeon: Evans Lance, MD;  Location: [facility];  Service: Cardiovascular;  Laterality: N/A;  . TEE WITHOUT CARDIOVERSION N/A 09/04/2018   Procedure: TRANSESOPHAGEAL ECHOCARDIOGRAM (TEE);  Surgeon: Jerline Pain, MD;  Location: [facility] ENDOSCOPY;  Service: Cardiovascular;  Laterality: N/A;   Family History  Problem Relation Age of Onset  . Kidney failure Mother   . Cancer Mother   . Prostate cancer Father   . Cancer Father   . Diabetes Father   . Colon cancer Neg Hx   . Esophageal cancer Neg Hx   . Rectal cancer Neg Hx   . Stomach cancer Neg Hx    Social History   Tobacco Use  . Smoking status: Never Smoker  . Smokeless tobacco: Never Used  Vaping Use  . Vaping Use: Never used  Substance Use Topics  . Alcohol use: No  . Drug use: No   Current Outpatient Medications  Medication Sig Dispense Refill  . acetaminophen (TYLENOL) 325 MG  tablet Take 2 tablets (650 mg total) by mouth every 4 (four) hours as needed for mild pain (or temp > 37.5 C (99.5 F)).    Marland Kitchen amLODipine (NORVASC) 5 MG tablet Take 5 mg by mouth daily.    Marland Kitchen atorvastatin (LIPITOR) 40 MG tablet Take 1 tablet (40 mg total) by mouth daily at 6 PM. 90 tablet 3  . Baclofen 5 MG TABS Take 5 mg by mouth 4 (four) times daily as needed. 360 tablet 3  . clopidogrel (PLAVIX) 75 MG tablet Take 1 tablet (75 mg total) by mouth daily. 90 tablet 0  . diclofenac sodium (VOLTAREN) 1 % GEL Apply 2 g topically 4 (four) times daily. 100 g 5  . multivitamin (ONE-A-DAY MEN'S) TABS tablet  Take 1 tablet by mouth daily.     No current facility-administered medications for this visit.   No Known Allergies   Review of Systems: All systems reviewed and negative except where noted in HPI.    CT SHOULDER RIGHT WO CONTRAST  Result Date: 11/15/2020 CLINICAL DATA:  Chronic right shoulder pain. Preop for total shoulder arthroplasty. EXAM: CT OF THE UPPER RIGHT EXTREMITY WITHOUT CONTRAST TECHNIQUE: Multidetector CT imaging of the upper right extremity was performed according to the standard protocol. COMPARISON:  Radiographs 01/24/2020 FINDINGS: Significant/advanced degenerative changes involving the right glenohumeral joint with marked joint space narrowing, osteophytic spurring, bony eburnation and subchondral cystic change. Slight widening of the glenoid but no significant thinning. No fracture or AVN. There is a 10 mm ossified loose body in the anterior joint space or subcoracoid bursa. The [facility] joint is intact. The acromion is type 2 in shape. No lateral downsloping subacromial spurring. The visualized right ribs are intact and the visualized scapula is intact. The visualized right lung is grossly clear. No worrisome pulmonary lesions. Grossly by CT the rotator cuff tendons are intact. No findings suspicious for a full-thickness retracted tear. No fatty atrophy of the rotator cuff muscles. IMPRESSION: 1. Significant/advanced degenerative changes involving the right glenohumeral joint as detailed above. 2. Grossly by CT the rotator cuff tendons are intact. No fatty atrophy of the rotator cuff muscles. Electronically Signed   By: Marijo Sanes M.D.   On: 11/15/2020 11:18   CUP PACEART REMOTE DEVICE CHECK  Result Date: 10/23/2020 ILR summary report received. Battery status OK. Normal device function. No new symptom, tachy, brady, or pause episodes. No new AF episodes. Monthly summary reports and ROV/PRN Kathy Breach, RN, CCDS, [employer]   Physical Exam: BP 120/78   Pulse 65   Ht  5\' 5"  (1.651 m)   Wt 167 lb (75.8 kg)   BMI 27.79 kg/m  Constitutional: Pleasant,well-developed, male in no acute distress. Cardiovascular: Normal rate, regular rhythm.  Pulmonary/chest: Effort normal and breath sounds normal.  Abdominal: Soft, nondistended, nontender. . There are no masses palpable.  Extremities: no edema Neurological: Alert and oriented to person place and time. Skin: Skin is warm and dry. No rashes noted. Psychiatric: Normal mood and affect. Behavior is normal.   ASSESSMENT AND PLAN: 65 year old male here to reestablish care for the following:  History of colon polyps Antiplatelet use  As above, had two small adenomas removed in December 2016.  Based on national guidelines at that time it was recommended he have another colonoscopy in 5 years.  However, I explained to him that national guidelines for polyp surveillance have since been updated and given he had 2 small adenomas on his last exam, he would not be  due for another exam for 7 years in the absence of any concerning symptoms.  He feels well without complaints.  He does have a history of a CVA on Plavix, and will need to hold Plavix 5 days before his next exam.  After discussion of the issue, he is comfortable changing his recall for colonoscopy to December 2026.  If he has any issues that arise in the interim he should contact me.  All questions answered  Scandia Cellar, MD [facility]  CC: Katherina Mires, MD

## 2020-11-24 ENCOUNTER — Ambulatory Visit (INDEPENDENT_AMBULATORY_CARE_PROVIDER_SITE_OTHER): Payer: Medicare Other

## 2020-11-24 DIAGNOSIS — I63512 Cerebral infarction due to unspecified occlusion or stenosis of left middle cerebral artery: Secondary | ICD-10-CM

## 2020-11-25 LAB — CUP PACEART REMOTE DEVICE CHECK
Date Time Interrogation Session: 20220501012007
Implantable Pulse Generator Implant Date: 20200210

## 2020-11-28 ENCOUNTER — Telehealth: Payer: Self-pay | Admitting: Adult Health

## 2020-11-28 NOTE — Telephone Encounter (Signed)
Pr called, getting ready to have surgery this month. Physician need to know how long I can stay off clopidogrel (PLAVIX) 75 MG tablet. Would like a call from the nurse

## 2020-12-01 NOTE — Telephone Encounter (Signed)
Pt going to have surgery 12-16-20 Dr. Meredith Pel RIGHT TOTAL SHOULDER ARTHROPLASTY.

## 2020-12-01 NOTE — Telephone Encounter (Signed)
We typically recommend holding Plavix 3 to 5 days prior to procedure with small but acceptable preprocedure risk of stroke while off therapy.  Recommend restarting immediately after or once hemodynamically stable.

## 2020-12-02 NOTE — Telephone Encounter (Signed)
Medical clearance form completed faxed to [phone] to [place] [facility].  (fax confirmation completed).

## 2020-12-08 NOTE — Telephone Encounter (Signed)
Pt called, physician asking for exacts dates can be off Plavix. Would like a call from the nurse.

## 2020-12-08 NOTE — Telephone Encounter (Signed)
Can hold plavix 3-5 days prior ... if surgeon agrees to needing to hold for only 3 days, can hold 3 days prior to his surgery but if he wishes for 5 days, hold 5 days prior

## 2020-12-08 NOTE — Telephone Encounter (Signed)
I called Edward Glass.  I relayed per Janett Billow that it would be up to Dr. Marlou Sa whether to hold your Plavix for 3 or 5 days prior to his procedure.  He will let them know.

## 2020-12-09 NOTE — Pre-Procedure Instructions (Addendum)
Surgical Instructions    Your procedure is scheduled on Tuesday, May 24th.  Report to [facility] Main Entrance "A" at 5:30 A.M., then check in with the Admitting office.  Call this number if you have problems the morning of surgery:  [phone]   If you have any questions prior to your surgery date call [phone]: Open Monday-Friday 8am-4pm    Remember:  Do not eat after midnight the night before your surgery  You may drink clear liquids until 4:30 a.m. the morning of your surgery.   Clear liquids allowed are: Water, Non-Citrus Juices (without pulp), Carbonated Beverages, Clear Tea, Black Coffee Only, and Gatorade.   Enhanced Recovery after Surgery for Orthopedics Enhanced Recovery after Surgery is a protocol used to improve the stress on your body and your recovery after surgery.  Patient Instructions  . The night before surgery:  o No food after midnight. ONLY clear liquids after midnight   . The day of surgery (if you do NOT have diabetes):  o Drink ONE (1) Pre-Surgery Clear Ensure by 4:30 am the morning of surgery   o This drink was given to you during your hospital  pre-op appointment visit. o Nothing else to drink after completing the  Pre-Surgery Clear Ensure.         If you have questions, please contact your surgeon's office.     Take these medicines the morning of surgery with A SIP OF WATER acetaminophen (TYLENOL)-as needed  amLODipine (NORVASC) Baclofen   Follow your surgeon's instructions on when to stop clopidogrel (PLAVIX).  If no instructions were given by your surgeon then you will need to call the office to get those instructions.    As of today, STOP taking any Aspirin (unless otherwise instructed by your surgeon) Aleve, Naproxen, Ibuprofen, Motrin, Advil, Goody's, BC's, all herbal medications, fish oil, and all vitamins. This includes: diclofenac sodium (VOLTAREN) GEL.                     Do NOT Smoke (Tobacco/Vaping) or drink Alcohol 24 hours  prior to your procedure.  If you use a CPAP at night, you may bring all equipment for your overnight stay.   Contacts, glasses, piercing's, hearing aid's, dentures or partials may not be worn into surgery, please bring cases for these belongings.    For patients admitted to the hospital, discharge time will be determined by your treatment team.   Patients discharged the day of surgery will not be allowed to drive home, and someone needs to stay with them for 24 hours.    Special instructions:                                    [facility]- Preparing for Total Shoulder Arthroplasty   Before surgery, you can play an important role. Because skin is not sterile, your skin needs to be as free of germs as possible. You can reduce the number of germs on your skin by using the following products. . Benzoyl Peroxide Gel o Reduces the number of germs present on the skin o Applied twice a day to shoulder area starting two days before surgery   . Chlorhexidine Gluconate (CHG) Soap o An antiseptic cleaner that kills germs and bonds with the skin to continue killing germs even after washing o Used for showering the night before surgery and morning of surgery   Oral Hygiene is also  important to reduce your risk of infection.                                    Remember - BRUSH YOUR TEETH THE MORNING OF SURGERY WITH YOUR REGULAR TOOTHPASTE  ==================================================================  Please follow these instructions carefully:  BENZOYL PEROXIDE 5% GEL  Please do not use if you have an allergy to benzoyl peroxide.   If your skin becomes reddened/irritated stop using the benzoyl peroxide.  Starting two days before surgery, apply as follows: 1. Apply benzoyl peroxide in the morning and at night. Apply after taking a shower. If you are not taking a shower clean entire shoulder front, back, and side along with the armpit with a clean wet washcloth.  2. Place a quarter-sized  dollop on your shoulder and rub in thoroughly, making sure to cover the front, back, and side of your shoulder, along with the armpit.   2 days before ____ AM   ____ PM              1 day before ____ AM   ____ PM                            3.  Do this twice a day for two days.  (Last application is the night before surgery, AFTER using the CHG soap as described below).  4. Do NOT apply benzoyl peroxide gel on the day of surgery.  CHLORHEXIDINE GLUCONATE (CHG) SOAP  Please do not use if you have an allergy to CHG or antibacterial soaps. If your skin becomes reddened/irritated stop using the CHG.   Do not shave (including legs and underarms) for at least 48 hours prior to first CHG shower. It is OK to shave your face.  Starting the night before surgery, use CHG soap as follows:  1. Shower the NIGHT BEFORE SURGERY and MORNING OF SURGERY with CHG.  2. If you choose to wash your hair, wash your hair first as usual with your normal shampoo.  3. After shampooing, rinse your hair and body thoroughly to remove the shampoo.  4. Use CHG as you would any other liquid soap.  You can apply CHG directly to the skin and wash gently with a scrungie or a clean washcloth.  5. Apply the CHG soap to your body ONLY FROM THE NECK DOWN.  Do not use on open wounds or open sores.  Avoid contact with your eyes, ears, mouth, and genitals (private parts).  Wash face and genitals (private parts) with your normal soap.  6. Wash thoroughly, paying special attention to the area where your surgery will be performed.  7. Thoroughly rinse your body with warm water from the neck down.  8. DO NOT shower/wash with your normal soap after using and rinsing off the CHG soap.   9. Pat yourself dry with a CLEAN TOWEL.   10.  Apply benzoyl peroxide.   11. Wear CLEAN PAJAMAS to bed the night before surgery; wear comfortable clothes the morning of surgery.  12. Place CLEAN SHEETS on your bed the night of your first  shower and DO NOT SLEEP WITH PETS.    Day of Surgery: Shower with CHG soap. Do not wear jewelry. Do not wear lotions, powders, colognes, or deodorant. Men may shave face and neck. Do not bring valuables to the hospital. [facility] is not  responsible for any belongings or valuables. Wear Clean/Comfortable clothing the morning of surgery Remember to brush your teeth WITH YOUR REGULAR TOOTHPASTE.   Please read over the following fact sheets that you were given.

## 2020-12-10 ENCOUNTER — Encounter (HOSPITAL_COMMUNITY)
Admission: RE | Admit: 2020-12-10 | Discharge: 2020-12-10 | Disposition: A | Discharge status: Home / Self Care | Payer: Medicare Other | Source: Ambulatory Visit | Attending: Orthopedic Surgery | Admitting: Orthopedic Surgery

## 2020-12-10 ENCOUNTER — Other Ambulatory Visit: Payer: Self-pay

## 2020-12-10 ENCOUNTER — Encounter (HOSPITAL_COMMUNITY): Payer: Self-pay

## 2020-12-10 DIAGNOSIS — Z01812 Encounter for preprocedural laboratory examination: Secondary | ICD-10-CM | POA: Diagnosis not present

## 2020-12-10 HISTORY — DX: Unspecified osteoarthritis, unspecified site: M19.90

## 2020-12-10 LAB — BASIC METABOLIC PANEL
Anion gap: 6 (ref 5–15)
BUN: 23 mg/dL (ref 8–23)
CO2: 26 mmol/L (ref 22–32)
Calcium: 9.2 mg/dL (ref 8.9–10.3)
Chloride: 105 mmol/L (ref 98–111)
Creatinine, Ser: 0.76 mg/dL (ref 0.61–1.24)
GFR, Estimated: >60 mL/min (ref >60)
Glucose, Bld: 91 mg/dL (ref 70–99)
Potassium: 3.9 mmol/L (ref 3.5–5.1)
Sodium: 137 mmol/L (ref 135–145)

## 2020-12-10 LAB — URINALYSIS, ROUTINE W REFLEX MICROSCOPIC
Bilirubin Urine: NEGATIVE
Glucose, UA: NEGATIVE mg/dL
Hgb urine dipstick: NEGATIVE
Ketones, ur: NEGATIVE mg/dL
Leukocytes,Ua: NEGATIVE
Nitrite: NEGATIVE
Protein, ur: NEGATIVE mg/dL
Specific Gravity, Urine: 1.017 (ref 1.005–1.030)
pH: 5 (ref 5.0–8.0)

## 2020-12-10 LAB — CBC
HCT: 45.9 % (ref 39.0–52.0)
Hemoglobin: 15 g/dL (ref 13.0–17.0)
MCH: 29.7 pg (ref 26.0–34.0)
MCHC: 32.7 g/dL (ref 30.0–36.0)
MCV: 90.9 fL (ref 80.0–100.0)
Platelets: 324 10*3/uL (ref 150–400)
RBC: 5.05 MIL/uL (ref 4.22–5.81)
RDW: 13.2 % (ref 11.5–15.5)
WBC: 6.6 10*3/uL (ref 4.0–10.5)
nRBC: 0 % (ref 0.0–0.2)

## 2020-12-10 LAB — SURGICAL PCR SCREEN
MRSA, PCR: NEGATIVE
Staphylococcus aureus: NEGATIVE

## 2020-12-10 NOTE — Progress Notes (Signed)
PCP - Suzanna Obey Cardiologist - Cristopher Peru  Neurologist: Frann Rider NP and also Dr. Leonie Man   PPM/ICD - medtronic loop recorder    Chest x-ray - n/a EKG - 04/09/20 Stress Test - denies ECHO - 09/04/18 Cardiac Cath - denies  Sleep Study - denies   No diabetes  ERAS Protcol -yes PRE-SURGERY Ensure or G2- ensure given  COVID TEST- 12/12/20 1:15pm  Follow your surgeon's instructions on when to stop clopidogrel (PLAVIX).  If no instructions were given by your surgeon then you will need to call the office to get those instructions.    As of today, STOP taking any Aspirin (unless otherwise instructed by your surgeon) Aleve, Naproxen, Ibuprofen, Motrin, Advil, Goody's, BC's, all herbal medications, fish oil, and all vitamins. This includes: diclofenac sodium (VOLTAREN) GEL.  Anesthesia review: yes, loop recorder  Patient denies shortness of breath, fever, cough and chest pain at PAT appointment   All instructions explained to the patient, with a verbal understanding of the material. Patient agrees to go over the instructions while at home for a better understanding. Patient also instructed to self quarantine after being tested for COVID-19. The opportunity to ask questions was provided.

## 2020-12-11 ENCOUNTER — Telehealth: Payer: Self-pay

## 2020-12-11 LAB — URINE CULTURE: Culture: NO GROWTH

## 2020-12-11 NOTE — Telephone Encounter (Signed)
Pt was called and stated understanding

## 2020-12-11 NOTE — Anesthesia Preprocedure Evaluation (Addendum)
Anesthesia Evaluation  Patient identified by MRN, date of birth, ID band Patient awake    Reviewed: Allergy & Precautions, NPO status , Patient's Chart, lab work & pertinent test results  Airway Mallampati: II  TM Distance: >3 FB Neck ROM: Full    Dental  (+) Teeth Intact, Dental Advisory Given   Pulmonary    breath sounds clear to auscultation       Cardiovascular hypertension,  Rhythm:Regular Rate:Normal     Neuro/Psych    GI/Hepatic   Endo/Other    Renal/GU      Musculoskeletal   Abdominal   Peds  Hematology   Anesthesia Other Findings   Reproductive/Obstetrics                            Anesthesia Physical Anesthesia Plan  ASA: III  Anesthesia Plan: General   Post-op Pain Management:  Regional for Post-op pain   Induction: Intravenous  PONV Risk Score and Plan: Ondansetron and Dexamethasone  Airway Management Planned: Oral ETT  Additional Equipment:   Intra-op Plan:   Post-operative Plan: Extubation in OR  Informed Consent: I have reviewed the patients History and Physical, chart, labs and discussed the procedure including the risks, benefits and alternatives for the proposed anesthesia with the patient or authorized representative who has indicated his/her understanding and acceptance.     Dental advisory given  Plan Discussed with: CRNA and Anesthesiologist  Anesthesia Plan Comments: (PAT note written 12/11/2020 by Myra Gianotti, PA-C. )       Anesthesia Quick Evaluation

## 2020-12-11 NOTE — Progress Notes (Signed)
Anesthesia Chart Review:  Case: [id number] Date/Time: 12/16/20 0715   Procedure: RIGHT TOTAL SHOULDER ARTHROPLASTY (Right Shoulder)   Anesthesia type: General   Pre-op diagnosis: RIGHT SHOULDER OSTEOARTHRITIS   Location: [facility] OR ROOM 06 / [facility]   Surgeons: Meredith Pel, MD      DISCUSSION: Patient is a 65 year old male scheduled for the above procedure.  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   Last EP follow-up with Dr. Lovena Le 04/09/20. Note reviewed. No arrhythmias.  Per loop recorder monitoring 3/122-10/23/20:  "ILR summary report received. Battery status OK. Normal device function. No new symptom, tachy, brady, or pause episodes. No new AF episodes. Monthly summary reports and ROV/PRN"  He has neurology clearance from Frann Rider, NP with permission yo hold Plavix 3-5 days prior to procedure with "small but acceptable preprocedure risk of stoke while off therapy." She recommended it be resumed asap once hemodynamically stable. Dr. Randel Pigg office to contact patient to provide instructions, but at this point I have heard a 5 day hold is planned.  Preoperative COVID-19 test is scheduled for 12/12/20. Anesthesia team to evaluate on the day of surgery.    VS: BP 128/75   Pulse 61   Temp 36.9 C (Oral)   Resp 18   Ht 5\' 5"  (1.651 m)   Wt 73.6 kg   SpO2 97%   BMI 27.00 kg/m    PROVIDERS: Katherina Mires, MD is PCP  - Antony Contras, MD is neurologist. Last visit 07/15/20 with Frann Rider, NP. 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 On baclofen. Continue  Plavix and statin. Six month follow-up planned. Cristopher Peru, MD is EP cardiologist (Loop recorder). Last visit 04/09/20 with normal ILR function. No afib detected. Had an episode of near syncope, likely orthostatic in nature--no brady or tachy on his monitor. (Previously had seen Lyman Bishop, MD and [place], Will, MD ~ [phone] for preoperative evaluation for "EKGs with findings of pre-excitation noted, Dr. Curt Bears felt had a possible anteroseptal pathway. His heart rate on the EKG is 57. An EKG done in the office today showed no evidence of preexcitation and a heart rate of 72. It is possible that he does not have a robust pathway and therefore would not be able to sustain tachycardia, a 48 hour monitor was done with no arrhythmias"   LABS: Labs reviewed: Acceptable for surgery. (all labs ordered are listed, but only abnormal results are displayed)  Labs Reviewed  URINALYSIS, ROUTINE W REFLEX MICROSCOPIC - Abnormal; Notable for the following components:      Result Value   APPearance CLOUDY (*)    All other components within normal limits  URINE CULTURE  SURGICAL PCR SCREEN  CBC  BASIC METABOLIC PANEL    IMAGES: CT Right Shoulder 11/14/20: IMPRESSION: 1. Significant/advanced degenerative changes involving the right glenohumeral joint as detailed above. 2. Grossly by CT the rotator cuff tendons are intact. No fatty atrophy of the rotator cuff muscles.   EKG: 04/09/20: NSR   CV: TEE 09/04/18: IMPRESSIONS  1. The left ventricle has normal systolic function of 19-14%.  2. The right ventricle has normal  systolic function.  3. Very small amount of bubble cross over was noted with cough/Valsalva.  4. The mitral valve is normal in structure.  5. The tricuspid valve was normal in structure.  6. The aortic valve is normal in structure.  7. The aortic root is normal in size and structure.  8. Small patent foramen ovale with predominantly right to left shunting  across the  atrial septum.  9. Evidence of atrial level shunting detected by color flow Doppler.  10. No evidence of left ventricular regional wall motion abnormalities.  11. There is redundancy of the interatrial septum.  Impression: Small PFO/bubble crossover noted during Valsalva.  No embolic source identified. (Per 09/04/18 note by neurologist Dr. Rosalin Hawking, "small PFO/bubble crossover noted during Valsalva, not clinically significant")   Transcranial Doppler with Bubbles 09/01/18: Summary:  There is no evidence of HITS (high intensity transient signals) at rest or  with Valsalva. Therefore, there is no evidence of PFO (patent foramen  ovale).  Negative TCD Bubble study    TTE 08/31/18: IMPRESSIONS  1. The left ventricle has normal systolic function of 60-63%. The cavity  size was normal. There is no increased left ventricular wall thickness.  Echo evidence of impaired diastolic relaxation.  2. The right ventricle has normal systolic function. The cavity was  normal. There is no increase in right ventricular wall thickness.  3. The mitral valve is normal in structure. No evidence of mitral valve  stenosis. No significant mitral regurgitation.  4. The tricuspid valve is normal in structure.  5. The aortic valve is tricuspid There is mild calcification of the  aortic valve. No aortic stenosis.  6. The pulmonic valve was normal in structure.  7. The aortic root and ascending aorta are normal in size and structure.  8. No evidence of left ventricular regional wall motion abnormalities.  9. The inferior vena cava is normal in size with greater than 50%  respiratory variability.  10. No complete TR doppler jet so unable to estimate PA systolic pressure.    No BLE DVT seen on 09/01/18 [place].   Past Medical History:  Diagnosis Date  . Arthritis    right shoulder  . Atypical chest pain 08/10/2013  . BPH (benign prostatic hyperplasia)   . COVID-19 07/2020  . CVA (cerebral vascular  accident) ([facility]) 08/2018   spastic hemiparesis of right dominant side  . Dysphagia   . Elevated aspartate aminotransferase level 06/16/2015  . Excessive salivation 06/16/2015  . H/O nutritional disorder 06/16/2015  . H/O vitamin D deficiency   . History of loop recorder 2020  . Hx of adenomatous colonic polyps 2016  . Hyperlipidemia   . Hypertension   . Hypogonadism male 08/21/2014  . 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 Screening for prostate cancer 08/21/2014  . 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 COLONOSCOPY    . HERNIA REPAIR  2006  . left tricep surgery    . LOOP RECORDER INSERTION N/A 09/04/2018   Procedure: LOOP RECORDER INSERTION;  Surgeon: Evans Lance, MD;  Location: [facility] CV LAB;  Service: Cardiovascular;  Laterality: N/A;  . TEE WITHOUT CARDIOVERSION N/A 09/04/2018   Procedure: TRANSESOPHAGEAL ECHOCARDIOGRAM (TEE);  Surgeon: Jerline Pain, MD;  Location: [facility] ENDOSCOPY;  Service: Cardiovascular;  Laterality: N/A;    MEDICATIONS: . acetaminophen (TYLENOL) 325 MG tablet  . amLODipine (NORVASC) 5 MG tablet  . atorvastatin (LIPITOR) 40 MG tablet  . Baclofen 5 MG TABS  .  clopidogrel (PLAVIX) 75 MG tablet  . diclofenac sodium (VOLTAREN) 1 % GEL  . multivitamin (ONE-A-DAY MEN'S) TABS tablet   No current facility-administered medications for this encounter.    Myra Gianotti, PA-C Surgical Short Stay/Anesthesiology [facility] Phone [phone] [facility] Phone [phone] 12/11/2020 4:07 PM

## 2020-12-11 NOTE — Telephone Encounter (Signed)
Stop taking today.  No blood thinners until after surgery on Tuesday per dr dean

## 2020-12-11 NOTE — Telephone Encounter (Signed)
Please advise 

## 2020-12-11 NOTE — Telephone Encounter (Signed)
Pt called asking when he needs to stop the blood thinners

## 2020-12-12 ENCOUNTER — Other Ambulatory Visit (HOSPITAL_COMMUNITY)
Admission: RE | Admit: 2020-12-12 | Discharge: 2020-12-12 | Disposition: A | Discharge status: Home / Self Care | Payer: Medicare Other | Source: Ambulatory Visit | Attending: Orthopedic Surgery | Admitting: Orthopedic Surgery

## 2020-12-12 ENCOUNTER — Other Ambulatory Visit (HOSPITAL_COMMUNITY): Payer: Medicare Other

## 2020-12-12 DIAGNOSIS — Z20822 Contact with and (suspected) exposure to covid-19: Secondary | ICD-10-CM | POA: Insufficient documentation

## 2020-12-12 DIAGNOSIS — Z01812 Encounter for preprocedural laboratory examination: Secondary | ICD-10-CM | POA: Insufficient documentation

## 2020-12-12 LAB — SARS CORONAVIRUS 2 (TAT 6-24 HRS): SARS Coronavirus 2: NEGATIVE

## 2020-12-16 ENCOUNTER — Encounter (HOSPITAL_COMMUNITY)
Admission: RE | Disposition: A | Discharge status: Home / Self Care | Payer: Self-pay | Source: Home / Self Care | Attending: Orthopedic Surgery

## 2020-12-16 ENCOUNTER — Ambulatory Visit (HOSPITAL_COMMUNITY): Payer: Medicare Other | Admitting: Vascular Surgery

## 2020-12-16 ENCOUNTER — Observation Stay (HOSPITAL_COMMUNITY): Payer: Medicare Other

## 2020-12-16 ENCOUNTER — Ambulatory Visit (HOSPITAL_COMMUNITY): Payer: Medicare Other | Admitting: Certified Registered"

## 2020-12-16 ENCOUNTER — Other Ambulatory Visit: Payer: Self-pay

## 2020-12-16 ENCOUNTER — Encounter (HOSPITAL_COMMUNITY): Payer: Self-pay | Admitting: Orthopedic Surgery

## 2020-12-16 ENCOUNTER — Observation Stay (HOSPITAL_COMMUNITY)
Admission: RE | Admit: 2020-12-16 | Discharge: 2020-12-17 | Disposition: A | Discharge status: Home / Self Care | Payer: Medicare Other | Attending: Orthopedic Surgery | Admitting: Orthopedic Surgery

## 2020-12-16 DIAGNOSIS — I10 Essential (primary) hypertension: Secondary | ICD-10-CM | POA: Insufficient documentation

## 2020-12-16 DIAGNOSIS — Z9889 Other specified postprocedural states: Secondary | ICD-10-CM

## 2020-12-16 DIAGNOSIS — Z79899 Other long term (current) drug therapy: Secondary | ICD-10-CM | POA: Diagnosis not present

## 2020-12-16 DIAGNOSIS — Z8616 Personal history of COVID-19: Secondary | ICD-10-CM | POA: Diagnosis not present

## 2020-12-16 DIAGNOSIS — M19011 Primary osteoarthritis, right shoulder: Secondary | ICD-10-CM | POA: Diagnosis present

## 2020-12-16 DIAGNOSIS — Z96611 Presence of right artificial shoulder joint: Secondary | ICD-10-CM

## 2020-12-16 HISTORY — PX: TOTAL SHOULDER ARTHROPLASTY: SHX126

## 2020-12-16 SURGERY — ARTHROPLASTY, SHOULDER, TOTAL
Anesthesia: General | Site: Shoulder | Laterality: Right

## 2020-12-16 MED ORDER — EPHEDRINE 5 MG/ML INJ
INTRAVENOUS | Status: AC
  Filled 2020-12-16: qty 10

## 2020-12-16 MED ORDER — ATORVASTATIN CALCIUM 40 MG PO TABS
40.0000 mg | ORAL_TABLET | Freq: Every day | ORAL | Status: DC
  Administered 2020-12-16: 40 mg via ORAL
  Filled 2020-12-16: qty 1

## 2020-12-16 MED ORDER — DOCUSATE SODIUM 100 MG PO CAPS
100.0000 mg | ORAL_CAPSULE | Freq: Two times a day (BID) | ORAL | Status: DC
  Administered 2020-12-16 – 2020-12-17 (×2): 100 mg via ORAL
  Filled 2020-12-16 (×2): qty 1

## 2020-12-16 MED ORDER — METHOCARBAMOL 500 MG PO TABS
500.0000 mg | ORAL_TABLET | Freq: Four times a day (QID) | ORAL | Status: DC | PRN
  Administered 2020-12-17: 500 mg via ORAL
  Filled 2020-12-16: qty 1

## 2020-12-16 MED ORDER — FENTANYL CITRATE (PF) 100 MCG/2ML IJ SOLN: 25.0000 ug | INTRAMUSCULAR | Status: DC | PRN

## 2020-12-16 MED ORDER — VANCOMYCIN HCL 1000 MG IV SOLR
INTRAVENOUS | Status: DC | PRN
  Administered 2020-12-16: 1000 mg via TOPICAL

## 2020-12-16 MED ORDER — METHOCARBAMOL 1000 MG/10ML IJ SOLN
500.0000 mg | Freq: Four times a day (QID) | INTRAVENOUS | Status: DC | PRN
  Filled 2020-12-16: qty 5

## 2020-12-16 MED ORDER — IRRISEPT - 450ML BOTTLE WITH 0.05% CHG IN STERILE WATER, USP 99.95% OPTIME
TOPICAL | Status: DC | PRN
  Administered 2020-12-16: 450 mL via TOPICAL

## 2020-12-16 MED ORDER — 0.9 % SODIUM CHLORIDE (POUR BTL) OPTIME
TOPICAL | Status: DC | PRN
  Administered 2020-12-16 (×5): 1000 mL

## 2020-12-16 MED ORDER — LACTATED RINGERS IV SOLN: INTRAVENOUS | Status: DC

## 2020-12-16 MED ORDER — MENTHOL 3 MG MT LOZG: 1.0000 | LOZENGE | OROMUCOSAL | Status: DC | PRN

## 2020-12-16 MED ORDER — ONDANSETRON HCL 4 MG PO TABS: 4.0000 mg | ORAL_TABLET | Freq: Four times a day (QID) | ORAL | Status: DC | PRN

## 2020-12-16 MED ORDER — ONDANSETRON HCL 4 MG/2ML IJ SOLN
INTRAMUSCULAR | Status: DC | PRN
  Administered 2020-12-16: 4 mg via INTRAVENOUS

## 2020-12-16 MED ORDER — AMLODIPINE BESYLATE 5 MG PO TABS
5.0000 mg | ORAL_TABLET | Freq: Every day | ORAL | Status: DC
  Administered 2020-12-17: 5 mg via ORAL
  Filled 2020-12-16: qty 1

## 2020-12-16 MED ORDER — TRANEXAMIC ACID-NACL 1000-0.7 MG/100ML-% IV SOLN
INTRAVENOUS | Status: AC
  Filled 2020-12-16: qty 100

## 2020-12-16 MED ORDER — ORAL CARE MOUTH RINSE: 15.0000 mL | Freq: Once | OROMUCOSAL | Status: AC

## 2020-12-16 MED ORDER — HEMOSTATIC AGENTS (NO CHARGE) OPTIME
TOPICAL | Status: DC | PRN
  Administered 2020-12-16 (×3): 1 via TOPICAL

## 2020-12-16 MED ORDER — PHENYLEPHRINE HCL (PRESSORS) 10 MG/ML IV SOLN
INTRAVENOUS | Status: AC
  Filled 2020-12-16: qty 1

## 2020-12-16 MED ORDER — ONDANSETRON HCL 4 MG/2ML IJ SOLN
INTRAMUSCULAR | Status: AC
  Filled 2020-12-16: qty 2

## 2020-12-16 MED ORDER — ONDANSETRON HCL 4 MG/2ML IJ SOLN: 4.0000 mg | Freq: Once | INTRAMUSCULAR | Status: DC | PRN

## 2020-12-16 MED ORDER — MIDAZOLAM HCL 2 MG/2ML IJ SOLN
INTRAMUSCULAR | Status: AC
  Filled 2020-12-16: qty 2

## 2020-12-16 MED ORDER — POVIDONE-IODINE 7.5 % EX SOLN: Freq: Once | CUTANEOUS | Status: DC

## 2020-12-16 MED ORDER — FENTANYL CITRATE (PF) 250 MCG/5ML IJ SOLN
INTRAMUSCULAR | Status: AC
  Filled 2020-12-16: qty 5

## 2020-12-16 MED ORDER — ONDANSETRON HCL 4 MG/2ML IJ SOLN
4.0000 mg | Freq: Four times a day (QID) | INTRAMUSCULAR | Status: DC | PRN
  Administered 2020-12-17: 4 mg via INTRAVENOUS
  Filled 2020-12-16: qty 2

## 2020-12-16 MED ORDER — BUPIVACAINE HCL (PF) 0.25 % IJ SOLN
INTRAMUSCULAR | Status: AC
  Filled 2020-12-16: qty 30

## 2020-12-16 MED ORDER — CEFAZOLIN SODIUM-DEXTROSE 1-4 GM/50ML-% IV SOLN
1.0000 g | Freq: Three times a day (TID) | INTRAVENOUS | Status: AC
  Administered 2020-12-16 – 2020-12-17 (×3): 1 g via INTRAVENOUS
  Filled 2020-12-16 (×3): qty 50

## 2020-12-16 MED ORDER — ACETAMINOPHEN 325 MG PO TABS: 325.0000 mg | ORAL_TABLET | Freq: Four times a day (QID) | ORAL | Status: DC | PRN

## 2020-12-16 MED ORDER — DEXAMETHASONE SODIUM PHOSPHATE 10 MG/ML IJ SOLN
INTRAMUSCULAR | Status: AC
  Filled 2020-12-16: qty 1

## 2020-12-16 MED ORDER — EPHEDRINE SULFATE-NACL 50-0.9 MG/10ML-% IV SOSY
PREFILLED_SYRINGE | INTRAVENOUS | Status: DC | PRN
  Administered 2020-12-16 (×2): 5 mg via INTRAVENOUS
  Administered 2020-12-16: 10 mg via INTRAVENOUS

## 2020-12-16 MED ORDER — FENTANYL CITRATE (PF) 100 MCG/2ML IJ SOLN
INTRAMUSCULAR | Status: AC
  Filled 2020-12-16: qty 2

## 2020-12-16 MED ORDER — PHENYLEPHRINE 40 MCG/ML (10ML) SYRINGE FOR IV PUSH (FOR BLOOD PRESSURE SUPPORT)
PREFILLED_SYRINGE | INTRAVENOUS | Status: DC | PRN
  Administered 2020-12-16: 40 ug via INTRAVENOUS
  Administered 2020-12-16 (×2): 80 ug via INTRAVENOUS
  Administered 2020-12-16 (×2): 40 ug via INTRAVENOUS
  Administered 2020-12-16: 80 ug via INTRAVENOUS
  Administered 2020-12-16 (×4): 40 ug via INTRAVENOUS
  Administered 2020-12-16 (×2): 80 ug via INTRAVENOUS

## 2020-12-16 MED ORDER — SUGAMMADEX SODIUM 200 MG/2ML IV SOLN
INTRAVENOUS | Status: DC | PRN
  Administered 2020-12-16: 100 mg via INTRAVENOUS

## 2020-12-16 MED ORDER — PHENOL 1.4 % MT LIQD: 1.0000 | OROMUCOSAL | Status: DC | PRN

## 2020-12-16 MED ORDER — MIDAZOLAM HCL 5 MG/5ML IJ SOLN
INTRAMUSCULAR | Status: DC | PRN
  Administered 2020-12-16: 1 mg via INTRAVENOUS

## 2020-12-16 MED ORDER — VANCOMYCIN HCL 1000 MG IV SOLR
INTRAVENOUS | Status: AC
  Filled 2020-12-16: qty 1000

## 2020-12-16 MED ORDER — ROCURONIUM BROMIDE 100 MG/10ML IV SOLN
INTRAVENOUS | Status: DC | PRN
  Administered 2020-12-16: 60 mg via INTRAVENOUS

## 2020-12-16 MED ORDER — METOCLOPRAMIDE HCL 5 MG/ML IJ SOLN: 5.0000 mg | Freq: Three times a day (TID) | INTRAMUSCULAR | Status: DC | PRN

## 2020-12-16 MED ORDER — PHENYLEPHRINE HCL-NACL 10-0.9 MG/250ML-% IV SOLN
INTRAVENOUS | Status: DC | PRN
  Administered 2020-12-16: 15 ug/min via INTRAVENOUS

## 2020-12-16 MED ORDER — LIDOCAINE 2% (20 MG/ML) 5 ML SYRINGE
INTRAMUSCULAR | Status: DC | PRN
  Administered 2020-12-16: 20 mg via INTRAVENOUS

## 2020-12-16 MED ORDER — HYDROMORPHONE HCL 1 MG/ML IJ SOLN: 0.5000 mg | INTRAMUSCULAR | Status: DC | PRN

## 2020-12-16 MED ORDER — PROPOFOL 10 MG/ML IV BOLUS
INTRAVENOUS | Status: AC
  Filled 2020-12-16: qty 40

## 2020-12-16 MED ORDER — ACETAMINOPHEN 500 MG PO TABS
1000.0000 mg | ORAL_TABLET | Freq: Four times a day (QID) | ORAL | Status: AC
  Administered 2020-12-16 – 2020-12-17 (×4): 1000 mg via ORAL
  Filled 2020-12-16 (×4): qty 2

## 2020-12-16 MED ORDER — DEXAMETHASONE SODIUM PHOSPHATE 10 MG/ML IJ SOLN
INTRAMUSCULAR | Status: DC | PRN
  Administered 2020-12-16: 10 mg via INTRAVENOUS

## 2020-12-16 MED ORDER — OXYCODONE HCL 5 MG PO TABS: 5.0000 mg | ORAL_TABLET | Freq: Once | ORAL | Status: DC | PRN

## 2020-12-16 MED ORDER — CLOPIDOGREL BISULFATE 75 MG PO TABS: 75.0000 mg | ORAL_TABLET | Freq: Every day | ORAL | Status: DC

## 2020-12-16 MED ORDER — CEFAZOLIN SODIUM-DEXTROSE 2-4 GM/100ML-% IV SOLN
2.0000 g | INTRAVENOUS | Status: AC
  Administered 2020-12-16 (×2): 2 g via INTRAVENOUS
  Filled 2020-12-16: qty 100

## 2020-12-16 MED ORDER — FENTANYL CITRATE (PF) 100 MCG/2ML IJ SOLN
INTRAMUSCULAR | Status: DC | PRN
  Administered 2020-12-16: 50 ug via INTRAVENOUS
  Administered 2020-12-16: 25 ug via INTRAVENOUS
  Administered 2020-12-16: 50 ug via INTRAVENOUS
  Administered 2020-12-16: 25 ug via INTRAVENOUS

## 2020-12-16 MED ORDER — CHLORHEXIDINE GLUCONATE 0.12 % MT SOLN
15.0000 mL | Freq: Once | OROMUCOSAL | Status: AC
  Administered 2020-12-16: 15 mL via OROMUCOSAL
  Filled 2020-12-16: qty 15

## 2020-12-16 MED ORDER — POVIDONE-IODINE 10 % EX SWAB: 2.0000 "application " | Freq: Once | CUTANEOUS | Status: DC

## 2020-12-16 MED ORDER — PROPOFOL 10 MG/ML IV BOLUS
INTRAVENOUS | Status: DC | PRN
  Administered 2020-12-16: 140 mg via INTRAVENOUS

## 2020-12-16 MED ORDER — OXYCODONE HCL 5 MG PO TABS
5.0000 mg | ORAL_TABLET | ORAL | Status: DC | PRN
  Administered 2020-12-17: 10 mg via ORAL
  Administered 2020-12-17: 5 mg via ORAL
  Filled 2020-12-16: qty 2
  Filled 2020-12-16: qty 1

## 2020-12-16 MED ORDER — METOCLOPRAMIDE HCL 5 MG PO TABS: 5.0000 mg | ORAL_TABLET | Freq: Three times a day (TID) | ORAL | Status: DC | PRN

## 2020-12-16 MED ORDER — ACETAMINOPHEN 325 MG PO TABS
325.0000 mg | ORAL_TABLET | Freq: Four times a day (QID) | ORAL | Status: DC | PRN
Start: 2020-12-17 — End: 2020-12-17

## 2020-12-16 MED ORDER — TRANEXAMIC ACID-NACL 1000-0.7 MG/100ML-% IV SOLN
INTRAVENOUS | Status: DC | PRN
  Administered 2020-12-16: 1000 mg via INTRAVENOUS

## 2020-12-16 MED ORDER — OXYCODONE HCL 5 MG/5ML PO SOLN
5.0000 mg | Freq: Once | ORAL | Status: DC | PRN
Start: 2020-12-16 — End: 2020-12-16

## 2020-12-16 MED ORDER — CLOPIDOGREL BISULFATE 75 MG PO TABS
75.0000 mg | ORAL_TABLET | Freq: Every day | ORAL | Status: DC
  Administered 2020-12-16 – 2020-12-17 (×2): 75 mg via ORAL
  Filled 2020-12-16 (×2): qty 1

## 2020-12-16 SURGICAL SUPPLY — 82 items
ALCOHOL 70% 16 OZ (MISCELLANEOUS) ×2 IMPLANT
BLADE SAW SGTL 13X75X1.27 (BLADE) ×2 IMPLANT
CEMENT BONE R 1X40 (Cement) ×2 IMPLANT
CHLORAPREP W/TINT 26 (MISCELLANEOUS) ×4 IMPLANT
CLSR STERI-STRIP ANTIMIC 1/2X4 (GAUZE/BANDAGES/DRESSINGS) ×2 IMPLANT
COOLER ICEMAN CLASSIC (MISCELLANEOUS) ×2 IMPLANT
COVER SURGICAL LIGHT HANDLE (MISCELLANEOUS) ×4 IMPLANT
COVER WAND RF STERILE (DRAPES) ×2 IMPLANT
DRAPE INCISE IOBAN 66X45 STRL (DRAPES) ×2 IMPLANT
DRAPE U-SHAPE 47X51 STRL (DRAPES) ×4 IMPLANT
DRESSING AQUACEL AG SP 3.5X10 (GAUZE/BANDAGES/DRESSINGS) ×1 IMPLANT
DRSG AQUACEL AG ADV 3.5X10 (GAUZE/BANDAGES/DRESSINGS) ×2 IMPLANT
DRSG AQUACEL AG SP 3.5X10 (GAUZE/BANDAGES/DRESSINGS) ×2
ELECT BLADE 4.0 EZ CLEAN MEGAD (MISCELLANEOUS) ×2
ELECT REM PT RETURN 9FT ADLT (ELECTROSURGICAL) ×2
ELECTRODE BLDE 4.0 EZ CLN MEGD (MISCELLANEOUS) ×1 IMPLANT
ELECTRODE REM PT RTRN 9FT ADLT (ELECTROSURGICAL) ×1 IMPLANT
GAUZE SPONGE 4X4 12PLY STRL LF (GAUZE/BANDAGES/DRESSINGS) ×2 IMPLANT
GLENOID PEGGED STRL MED 4MM (Orthopedic Implant) ×2 IMPLANT
GLENOID POST REGENREX HYBRID (Orthopedic Implant) ×2 IMPLANT
GLOVE ECLIPSE 7.0 STRL STRAW (GLOVE) ×2 IMPLANT
GLOVE ECLIPSE 8.0 STRL XLNG CF (GLOVE) ×2 IMPLANT
GLOVE SRG 8 PF TXTR STRL LF DI (GLOVE) ×1 IMPLANT
GLOVE SURG UNDER POLY LF SZ7 (GLOVE) ×2 IMPLANT
GLOVE SURG UNDER POLY LF SZ8 (GLOVE) ×2
GOWN STRL REUS W/ TWL LRG LVL3 (GOWN DISPOSABLE) ×2 IMPLANT
GOWN STRL REUS W/ TWL XL LVL3 (GOWN DISPOSABLE) ×1 IMPLANT
GOWN STRL REUS W/TWL LRG LVL3 (GOWN DISPOSABLE) ×4
GOWN STRL REUS W/TWL XL LVL3 (GOWN DISPOSABLE) ×2
GUIDE MODEL REV SHLD RT SZ2 (ORTHOPEDIC DISPOSABLE SUPPLIES) ×2 IMPLANT
HEAD HUMERAL BIPOLAR 46X27X46 (Miscellaneous) ×1 IMPLANT
HEAD HUMERAL BIPOLAR 47X24X46 (Miscellaneous) ×1 IMPLANT
HEAD HUMERAL COMP STD (Orthopedic Implant) ×1 IMPLANT
HEMOSTAT SURGICEL .5X2 ABSORB (HEMOSTASIS) ×6 IMPLANT
HUMERAL HEAD BIPOLAR 46X27X46 (Miscellaneous) ×2 IMPLANT
HUMERAL HEAD BIPOLAR 47X24X46 (Miscellaneous) ×2 IMPLANT
HUMERAL HEAD COMP STD (Orthopedic Implant) ×2 IMPLANT
HYDROGEN PEROXIDE 16OZ (MISCELLANEOUS) ×2 IMPLANT
JET LAVAGE IRRISEPT WOUND (IRRIGATION / IRRIGATOR) ×2
KIT BASIN OR (CUSTOM PROCEDURE TRAY) ×4 IMPLANT
KIT TURNOVER KIT B (KITS) ×2 IMPLANT
LAVAGE JET IRRISEPT WOUND (IRRIGATION / IRRIGATOR) ×1 IMPLANT
LOOP VESSEL MAXI BLUE (MISCELLANEOUS) ×2 IMPLANT
MANIFOLD NEPTUNE II (INSTRUMENTS) ×2 IMPLANT
NDL SUT 6 .5 CRC .975X.05 MAYO (NEEDLE) ×1 IMPLANT
NEEDLE HYPO 25GX1X1/2 BEV (NEEDLE) IMPLANT
NEEDLE MAYO TAPER (NEEDLE) ×2
NS IRRIG 1000ML POUR BTL (IV SOLUTION) ×6 IMPLANT
PACK SHOULDER (CUSTOM PROCEDURE TRAY) ×2 IMPLANT
PAD ARMBOARD 7.5X6 YLW CONV (MISCELLANEOUS) ×4 IMPLANT
PAD COLD SHLDR WRAP-ON (PAD) ×2 IMPLANT
PIN HUMERAL STMN 3.2MMX9IN (INSTRUMENTS) ×2 IMPLANT
PIN STEINMANN THREADED TIP (PIN) ×2 IMPLANT
PIN THREADED REVERSE (PIN) ×2 IMPLANT
RESTRAINT HEAD UNIVERSAL NS (MISCELLANEOUS) ×2 IMPLANT
RETRIEVER SUT HEWSON (MISCELLANEOUS) ×4 IMPLANT
SLING ARM IMMOBILIZER LRG (SOFTGOODS) ×2 IMPLANT
SMARTMIX MINI TOWER (MISCELLANEOUS) ×2
SOL PREP POV-IOD 4OZ 10% (MISCELLANEOUS) ×2 IMPLANT
SPONGE LAP 18X18 RF (DISPOSABLE) ×8 IMPLANT
STEM HUMERAL STRL 15MMX140MM (Stem) ×2 IMPLANT
STEM SHOULDER 16MMX55MM LONG (Stem) ×2 IMPLANT
STRIP CLOSURE SKIN 1/2X4 (GAUZE/BANDAGES/DRESSINGS) ×2 IMPLANT
SUCTION FRAZIER HANDLE 10FR (MISCELLANEOUS) ×4
SUCTION TUBE FRAZIER 10FR DISP (MISCELLANEOUS) ×2 IMPLANT
SUT BROADBAND TAPE 2PK 1.5 (SUTURE) ×6 IMPLANT
SUT MNCRL AB 3-0 PS2 18 (SUTURE) ×4 IMPLANT
SUT SILK 2 0 TIES 10X30 (SUTURE) ×2 IMPLANT
SUT VIC AB 0 CT1 27 (SUTURE) ×4
SUT VIC AB 0 CT1 27XBRD ANBCTR (SUTURE) ×2 IMPLANT
SUT VIC AB 1 CT1 27 (SUTURE) ×8
SUT VIC AB 1 CT1 27XBRD ANBCTR (SUTURE) ×4 IMPLANT
SUT VIC AB 2-0 CT1 27 (SUTURE) ×6
SUT VIC AB 2-0 CT1 TAPERPNT 27 (SUTURE) ×3 IMPLANT
SUT VICRYL 0 AB UR-6 (SUTURE) ×2 IMPLANT
SUT VICRYL 0 UR6 27IN ABS (SUTURE) ×14 IMPLANT
TOWEL GREEN STERILE (TOWEL DISPOSABLE) ×2 IMPLANT
TOWEL GREEN STERILE FF (TOWEL DISPOSABLE) ×4 IMPLANT
TOWER SMARTMIX MINI (MISCELLANEOUS) ×1 IMPLANT
TRAY FOL W/BAG SLVR 16FR STRL (SET/KITS/TRAYS/PACK) ×1 IMPLANT
TRAY FOLEY W/BAG SLVR 16FR LF (SET/KITS/TRAYS/PACK) ×2
WATER STERILE IRR 1000ML POUR (IV SOLUTION) ×2 IMPLANT

## 2020-12-16 NOTE — Anesthesia Procedure Notes (Signed)
Procedure Name: Intubation Date/Time: 12/16/2020 8:14 AM Performed by: Gwyndolyn Saxon, CRNA Pre-anesthesia Checklist: Patient identified, Emergency Drugs available, Suction available and Patient being monitored Patient Re-evaluated:Patient Re-evaluated prior to induction Oxygen Delivery Method: Circle system utilized Preoxygenation: Pre-oxygenation with 100% oxygen Induction Type: IV induction Ventilation: Mask ventilation without difficulty Laryngoscope Size: Mac and 3 Grade View: Grade I Tube type: Oral Tube size: 7.5 mm Number of attempts: 1 Airway Equipment and Method: Patient positioned with wedge pillow and Stylet Placement Confirmation: ETT inserted through vocal cords under direct vision,  positive ETCO2 and breath sounds checked- equal and bilateral Secured at: 22 cm Tube secured with: Tape Dental Injury: Teeth and Oropharynx as per pre-operative assessment

## 2020-12-16 NOTE — Progress Notes (Signed)
Bedside shift report complete. Received patient awake,alert/orientedx4 and able to verbalize needs. NAD noted; respirations easy/even on room air. Continuous pulse ox connected. IVF infusing at this time. Dressing to R shoulder c/d/i; ice in place; sling intact. No sensation or movement to RUE at this time due to nerve block per report. Noted weakness to R side from previous CVS. Movement/sensation to left side intact. Whiteboard updated. All safety measures in place and personal belongings within reach.

## 2020-12-16 NOTE — Progress Notes (Signed)
[facility] Summary Report / Loop Recorder 

## 2020-12-16 NOTE — Transfer of Care (Signed)
Immediate Anesthesia Transfer of Care Note  Patient: Edward Glass  Procedure(s) Performed: RIGHT TOTAL SHOULDER ARTHROPLASTY (Right Shoulder)  Patient Location: PACU  Anesthesia Type:GA combined with regional for post-op pain  Level of Consciousness: awake, alert  and oriented  Airway & Oxygen Therapy: Patient Spontanous Breathing and Patient connected to face mask oxygen  Post-op Assessment: Report given to RN and Post -op Vital signs reviewed and stable  Post vital signs: Reviewed and stable  Last Vitals:  Vitals Value Taken Time  BP 143/66 12/16/20 1435  Temp    Pulse 87 12/16/20 1443  Resp 21 12/16/20 1443  SpO2 97 % 12/16/20 1443  Vitals shown include unvalidated device data.  Last Pain:  Vitals:   12/16/20 0559  TempSrc: Oral  PainSc:       Patients Stated Pain Goal: 3 (82/99/37 1696)  Complications: No complications documented.

## 2020-12-16 NOTE — H&P (Signed)
Edward Glass is an 65 y.o. male.   Chief Complaint: Right shoulder pain HPI: Edward Glass is a 65 year old patient with end-stage right shoulder arthritis.  He has had a stroke affecting his right side but he has recovered to a significant extent with functional activity in the right arm but is limited by pain and loss of motion.  Preop think that CT scanning demonstrates maintenance of the acromiohumeral interval as well as severe end-stage arthritis and apparently intact rotator cuff tendons.  This is confirmed on physical examination and prior visits.  He has had an injection in the shoulder joint which only gave him reasonable but short-term relief.  Presents now for operative management after explanation of risks and benefits.  Past Medical History:  Diagnosis Date  . Arthritis    right shoulder  . Atypical chest pain 08/10/2013  . BPH (benign prostatic hyperplasia)   . COVID-19 07/2020  . CVA (cerebral vascular accident) ([place]) 08/2018   spastic hemiparesis of right dominant side  . Dysphagia   . Elevated aspartate aminotransferase level 06/16/2015  . Excessive salivation 06/16/2015  . H/O nutritional disorder 06/16/2015  . H/O vitamin D deficiency   . History of loop recorder 2020  . Hx of adenomatous colonic polyps 2016  . Hyperlipidemia   . Hypertension   . Hypogonadism male 08/21/2014  . 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 Screening for prostate cancer 08/21/2014  . 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 COLONOSCOPY    . HERNIA REPAIR  2006  . left tricep surgery    . LOOP RECORDER INSERTION N/A 09/04/2018   Procedure: LOOP RECORDER INSERTION;  Surgeon: Evans Lance, MD;  Location: [facility] LAB;  Service: Cardiovascular;  Laterality: N/A;  . TEE WITHOUT CARDIOVERSION N/A 09/04/2018   Procedure: TRANSESOPHAGEAL ECHOCARDIOGRAM (TEE);  Surgeon: Jerline Pain, MD;  Location[facility] ENDOSCOPY;  Service: Cardiovascular;  Laterality: N/A;     Family History  Problem Relation Age of Onset  . Kidney failure Mother   . Cancer Mother   . Prostate cancer Father   . Cancer Father   . Diabetes Father   . Colon cancer Neg Hx   . Esophageal cancer Neg Hx   . Rectal cancer Neg Hx   . Stomach cancer Neg Hx    Social History:  reports that he has never smoked. He has never used smokeless tobacco. He reports that he does not drink alcohol and does not use drugs.  Allergies: No Known Allergies  Medications Prior to Admission  Medication Sig Dispense Refill  . acetaminophen (TYLENOL) 325 MG tablet Take 2 tablets (650 mg total) by mouth every 4 (four) hours as needed for mild pain (or temp > 37.5 C (99.5 F)).    Marland Kitchen amLODipine (NORVASC) 5 MG tablet Take 5 mg by mouth daily.    Marland Kitchen atorvastatin (LIPITOR) 40 MG tablet Take 1 tablet (40 mg total) by mouth daily at 6 PM. 90 tablet 3  . Baclofen 5 MG TABS Take 5 mg by mouth 4 (four) times daily as needed. (Patient taking differently: Take 5 mg by mouth 3 (three) times daily.) 360 tablet 3  . clopidogrel (PLAVIX) 75 MG tablet Take 1 tablet (75 mg total) by mouth daily. 90 tablet 0  . diclofenac sodium (VOLTAREN) 1 % GEL Apply 2 g topically 4 (four) times daily. (Patient taking differently: Apply 2 g topically daily.) 100 g 5  . multivitamin (ONE-A-DAY MEN'S) TABS  tablet Take 1 tablet by mouth daily.      No results found for this or any previous visit (from the past 48 hour(s)). No results found.  Review of Systems  Musculoskeletal: Positive for arthralgias.  All other systems reviewed and are negative.   Blood pressure 126/70, pulse 68, temperature 97.7 F (36.5 C), temperature source Oral, resp. rate 18, height 5\' 5"  (1.651 m), weight 73.6 kg, SpO2 98 %. Physical Exam Vitals reviewed.  HENT:     Head: Normocephalic.     Nose: Nose normal.     Mouth/Throat:     Mouth: Mucous membranes are moist.  Eyes:     Pupils: Pupils are equal, round, and reactive to light.  Cardiovascular:      Pulses: Normal pulses.  Pulmonary:     Effort: Pulmonary effort is normal.  Abdominal:     General: Abdomen is flat.  Musculoskeletal:     Cervical back: Normal range of motion.  Skin:    General: Skin is warm.     Capillary Refill: Capillary refill takes less than 2 seconds.  Neurological:     Mental Status: He is alert. Mental status is at baseline.  Psychiatric:        Mood and Affect: Mood normal.     Ortho exam demonstrates full active and passive range of motion of the left shoulder.  On the right-hand side he has passively range of motion 15/60/80/with good deltoid strength as well as good rotator cuff strength to infraspinatus supraspinatus and subscap muscle testing.  He also has pretty reasonable grip strength EPL FPL interosseous wrist flexion wrist extension biceps triceps strength on the right-hand side which is only slightly less than the left-hand side.  Radial pulse is intact on the right-hand side Assessment/Plan Impression is right shoulder arthritis.  Plan is right shoulder replacement.  Rotator cuff strength appears good and CT scan for preop planning purposes demonstrated intact rotator cuff tendons.  Patient does have some limitation of motion.  Glenoid bone stock appears adequate.  Risk and benefits of shoulder replacement are discussed with the patient including not limited to infection nerve vessel damage instability as well as potential need for revision in his lifetime.  In general he wants to be able to get back to more active life particularly with fitness.  With rotator cuff intact I think total shoulder replacement would be more compatible with meeting those goals than reverse replacement.  Nonetheless if his rotator cuff is not intact then we can alter the plan to go with reverse replacement.  All questions answered.  Anderson Malta, MD 12/16/2020, 7:38 AM

## 2020-12-16 NOTE — Plan of Care (Signed)

## 2020-12-16 NOTE — Brief Op Note (Signed)
   12/16/2020  2:54 PM  PATIENT:  [name]  65 y.o. male  PRE-OPERATIVE DIAGNOSIS:  RIGHT SHOULDER OSTEOARTHRITIS  POST-OPERATIVE DIAGNOSIS:  RIGHT SHOULDER OSTEOARTHRITIS  PROCEDURE:  Procedure(s): RIGHT TOTAL SHOULDER ARTHROPLASTY  SURGEON:  Surgeon(s): Marlou Sa, Tonna Corner, MD  ASSISTANT: Annie Main, PA  ANESTHESIA:   general  EBL: 200 ml    Total I/O In: 2320 [I.V.:2100; IV Piggyback:220] Out: 909 [Urine:625; Blood:250]  BLOOD ADMINISTERED: none  DRAINS: none   LOCAL MEDICATIONS USED: Vancomycin powder  SPECIMEN:  No Specimen  COUNTS:  YES  TOURNIQUET:  * No tourniquets in log *  DICTATION: .Other Dictation: Dictation Number [id number]  PLAN OF CARE: Admit for overnight observation  PATIENT DISPOSITION:  PACU - hemodynamically stable

## 2020-12-16 NOTE — Anesthesia Procedure Notes (Signed)
Anesthesia Regional Block: Interscalene brachial plexus block   Pre-Anesthetic Checklist: ,, timeout performed, Correct Patient, Correct Site, Correct Laterality, Correct Procedure, Correct Position, site marked, Risks and benefits discussed,  Surgical consent,  Pre-op evaluation,  At surgeon's request and post-op pain management  Laterality: Right  Prep: chloraprep       Needles:  Injection technique: Single-shot  Needle Type: Stimulator Needle - 40      Needle Gauge: 22     Additional Needles:   Procedures:, nerve stimulator,,,,,,,  Narrative:  Start time: 12/16/2020 6:50 AM End time: 12/16/2020 7:00 AM Injection made incrementally with aspirations every 5 mL.  Performed by: Personally  Anesthesiologist: Shankles Gaudy, MD  Additional Notes: 25 cc 0.5% Bupivacaine with 1:200 Epi 10 cc 1.3% Exparel

## 2020-12-16 NOTE — Anesthesia Postprocedure Evaluation (Signed)
Anesthesia Post Note  Patient: Edward Glass  Procedure(s) Performed: RIGHT TOTAL SHOULDER ARTHROPLASTY (Right Shoulder)     Patient location during evaluation: PACU Anesthesia Type: General Level of consciousness: awake and alert Pain management: pain level controlled Vital Signs Assessment: post-procedure vital signs reviewed and stable Respiratory status: spontaneous breathing, nonlabored ventilation and respiratory function stable Cardiovascular status: blood pressure returned to baseline and stable Postop Assessment: no apparent nausea or vomiting Anesthetic complications: no   No complications documented.  Last Vitals:  Vitals:   12/16/20 1505 12/16/20 1535  BP: (!) 154/68 (!) 147/63  Pulse: 83 78  Resp: 18 17  Temp:  (!) 36.1 C  SpO2: 93% 94%    Last Pain:  Vitals:   12/16/20 1535  TempSrc:   PainSc: 0-No pain                 Lidia Collum

## 2020-12-17 DIAGNOSIS — M19011 Primary osteoarthritis, right shoulder: Secondary | ICD-10-CM | POA: Diagnosis not present

## 2020-12-17 MED ORDER — OXYCODONE HCL 5 MG PO TABS: 5.0000 mg | ORAL_TABLET | ORAL | 0 refills | Status: DC | PRN

## 2020-12-17 NOTE — Progress Notes (Signed)
Occupational Therapy Evaluation Patient Details Name: Edward Glass MRN: [id number] DOB: Dec 08, 1955 Today's Date: 12/17/2020    History of Present Illness Edward Glass is a 65 year old patient with end-stage right shoulder arthritis. RIGHT TOTAL SHOULDER ARTHROPLASTY 5/24. He has had a stroke affecting his right side but he has recovered to a significant extent with functional activity in the right arm but is limited by pain and loss of motion. History includes never smoker, HTN, HLD, CVA, BPH, hypogonadism + COVID-19 07/31/20.   Clinical Impression   Pt is doing well with some reports of nausea and pain, RN aware. Wife present and active participant in eval today. PTA pt reports being mod I with all ADL/IADLs with use of quad cane for ambulation and tub bench for bathing, he works as a Physiological scientist. Upon evaluation pt is mod A for upper body ADLs and demonstrated great ability to use safe compensatory techniques for bathing/dressing. He is min A for lower body dressing. Pt completed all functional ambulation with min guard using quad cane. He tolerated all exercises well and verbalized his sling wear schedule. Pt does not required continued acute OT services, he was left with shoulder d/c sheets and HEP. Recommend to follow surgeons d/c follow up plan.     Follow Up Recommendations  Follow surgeon's recommendation for DC plan and follow-up therapies;Supervision - Intermittent    Equipment Recommendations  None recommended by OT       Precautions / Restrictions Precautions Precautions: Fall;Shoulder Type of Shoulder Precautions: R TSA Shoulder Interventions: Shoulder sling/immobilizer;At all times;Off for dressing/bathing/exercises Precaution Booklet Issued: Yes (comment) Precaution Comments: Okay for AROM of elbow, wrist and hand, okay for pendulums, no shoulder AROM, external rotation limited to 30*, R NWB Required Braces or Orthoses: Sling Restrictions Weight Bearing Restrictions:  Yes RUE Weight Bearing: Non weight bearing      Mobility Bed Mobility Overal bed mobility: Needs Assistance             General bed mobility comments: Pt in chair upon arrival    Transfers Overall transfer level: Needs assistance Equipment used: Quad cane Transfers: Sit to/from Stand Sit to Stand: Min guard              Balance Overall balance assessment: Needs assistance Sitting-balance support: No upper extremity supported Sitting balance-Leahy Scale: Good     Standing balance support: No upper extremity supported Standing balance-Leahy Scale: Fair            ADL either performed or assessed with clinical judgement   ADL Overall ADL's : Needs assistance/impaired Eating/Feeding: Sitting;Set up   Grooming: Set up;Standing   Upper Body Bathing: Moderate assistance;Sitting   Lower Body Bathing: Minimal assistance;Sit to/from stand   Upper Body Dressing : Moderate assistance;Sitting   Lower Body Dressing: Minimal assistance;Sit to/from stand   Toilet Transfer: Min guard;Comfort height toilet (with quad cane)   Toileting- Water quality scientist and Hygiene: Min guard;Sit to/from stand       Functional mobility during ADLs: Min guard;Cueing for safety;Cane General ADL Comments: Pt demonstrated great ability to use compensatory techniques for ADLs, uses quad cane for ambulation at baseline                  Pertinent Vitals/Pain Pain Assessment: Faces Faces Pain Scale: Hurts little more Pain Location: R shoulder Pain Descriptors / Indicators: Discomfort;Grimacing;Guarding Pain Intervention(s): Limited activity within patient's tolerance;Monitored during session;Ice applied        Extremity/Trunk Assessment Upper Extremity Assessment Upper Extremity Assessment: RUE  deficits/detail RUE Deficits / Details: s/p R TSA RUE Sensation: decreased light touch;decreased proprioception RUE Coordination: decreased fine motor;decreased gross motor    Lower Extremity Assessment Lower Extremity Assessment: Defer to PT evaluation   Cervical / Trunk Assessment Cervical / Trunk Assessment: Normal   Communication Communication Communication: No difficulties   Cognition Arousal/Alertness: Awake/alert Behavior During Therapy: WFL for tasks assessed/performed Overall Cognitive Status: Within Functional Limits for tasks assessed        General Comments: Pt with slightly slow processing and word finding, however conversation, attention, memory, and problem solving are all on point   General Comments  Pts sx site was clean and dry, report of some nausea and pain, RN aware      Home Living Family/patient expects to be discharged to:: Private residence Living Arrangements: Spouse/significant other Available Help at Discharge: Family;Available 24 hours/day Type of Home: House Home Access: Stairs to enter [employer] of Steps: 3 Entrance Stairs-Rails: None Home Layout: Two level;Bed/bath upstairs Alternate Level Stairs-Number of Steps: 12 with landing inbetween   Bathroom Shower/Tub: Tub/shower unit;Curtain   Bathroom Toilet: Standard Bathroom Accessibility: Yes   Home Equipment: Tub bench;Walker - 2 wheels;Cane - quad;Hand held shower head          Prior Functioning/Environment Level of Independence: Independent with assistive device(s)        Comments: walk with quad cane, works as Chartered certified accountant Problem List: Decreased strength;Decreased range of motion;Decreased activity tolerance;Impaired balance (sitting and/or standing);Decreased safety awareness;Decreased knowledge of use of DME or AE;Decreased knowledge of precautions;Pain;Impaired UE functional use;Increased edema      OT Treatment/Interventions:      OT Goals(Current goals can be found in the care plan section) Acute Rehab OT Goals Patient Stated Goal: home today   AM-PAC OT "6 Clicks" Daily Activity     Outcome Measure Help from  another person eating meals?: A Little Help from another person taking care of personal grooming?: A Little Help from another person toileting, which includes using toliet, bedpan, or urinal?: A Little Help from another person bathing (including washing, rinsing, drying)?: A Lot Help from another person to put on and taking off regular upper body clothing?: A Lot Help from another person to put on and taking off regular lower body clothing?: A Little 6 Click Score: 16   End of Session Equipment Utilized During Treatment: Other (comment) (sling, quad cane) Nurse Communication: Mobility status  Activity Tolerance: Patient tolerated treatment well Patient left: in chair;with call bell/phone within reach;with family/visitor present  OT Visit Diagnosis: Muscle weakness (generalized) (M62.81);Pain Pain - Right/Left: Right Pain - part of body: Shoulder                Time: 2458-0998 OT Time Calculation (min): 43 min Charges:  OT General Charges $OT Visit: 1 Visit OT Evaluation $OT Eval Moderate Complexity: 1 Mod OT Treatments $Self Care/Home Management : 8-22 mins $Therapeutic Exercise: 23-37 mins    Carlin Attridge A Navea Woodrow 12/17/2020, 10:32 AM

## 2020-12-17 NOTE — Evaluation (Signed)
Physical Therapy Evaluation Patient Details Name: Edward Glass MRN: [id number] DOB: [birth year] Today's Date: 12/17/2020   History of Present Illness  Edward Glass is a 65 year old patient with end-stage right shoulder arthritis. R TSA on 12/16/20. He has had a stroke affecting his right side but he has recovered to a significant extent with functional activity in the right arm but is limited by pain and loss of motion. History includes HTN, CVA, + COVID-19 07/31/20, L triceps surgery.  Clinical Impression  Pt reports being a bit stiff in his right leg today due to not moving around as much as normal and not having had his baclofen for his R leg spasticity (he normally takes TID at home).  RN informed and pt agreeable to the muscle relaxer ordered here in the hospital).  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  PT's focus was on stair training and education.  Pt did just fine simulating stairs for home entry and for bedroom access (supervision after a few trial steps).  Pt/wife had questions about the UE CPM at home and I advised them to ask Dr. Marlou Sa.   PT to follow acutely for deficits listed below.    Follow Up Recommendations No PT follow up;Follow surgeon's recommendation for DC plan and follow-up therapies    Equipment Recommendations  None recommended by PT    Recommendations for Other Services       Precautions / Restrictions Precautions Precautions: Fall;Shoulder Type of Shoulder Precautions: R TSA Shoulder Interventions: Shoulder sling/immobilizer;At all times;Off for dressing/bathing/exercises Precaution Booklet Issued: Yes (comment) Precaution Comments: Okay for AROM of elbow, wrist and hand, okay for pendulums, no shoulder AROM, external rotation limited to 30*, R NWB Required Braces or Orthoses: Sling Restrictions Weight Bearing Restrictions: Yes RUE Weight Bearing: Non weight bearing      Mobility  Bed Mobility Overal bed mobility:  Needs Assistance             General bed mobility comments: Pt reports no difficulty getting up OOB earlier and sleeps on the left side (which is the prefered side given his surgery.)    Transfers Overall transfer level: Needs assistance Equipment used: Quad cane Transfers: Sit to/from Stand Sit to Stand: Supervision         General transfer comment: supervision for safety  Ambulation/Gait Ambulation/Gait assistance: Supervision Gait Distance (Feet): 10 Feet (x3) Assistive device: Quad cane Gait Pattern/deviations: Step-through pattern;Decreased dorsiflexion - right;Steppage;Decreased step length - right Gait velocity: decreased at baseline Gait velocity interpretation: 1.31 - 2.62 ft/sec, indicative of limited community ambulator    Stairs Stairs: Yes Stairs assistance: Supervision Stair Management: No rails;One rail Right;One rail Left;Alternating pattern;Step to pattern;Forwards;With cane (quad cane) Number of Stairs: 15 (5, 10) General stair comments: Practiced stairs simulating home entry (2 stairs no rail with quad cane). pt goes up with his weak leg, we attempted to reverse that and go up with his strong leg, but he is so used to going up with his weak leg that it is throwing him more off balance than if he goes up with his weak leg the way he is used to doing it.  For the full flight of stairs simulating bedroom access he has one rail on each side and is able to go up reciprocally and down mostly step to pattern, but with rail could also safely go down a few steps reciprocally.  Wife present throughout observing how well he did.  Wheelchair Mobility  Modified Rankin (Stroke Patients Only)       Balance Overall balance assessment: Needs assistance Sitting-balance support: Feet supported;No upper extremity supported Sitting balance-Leahy Scale: Good     Standing balance support: No upper extremity supported;Single extremity supported Standing balance-Leahy  Scale: Fair                               Pertinent Vitals/Pain Pain Assessment: Faces Faces Pain Scale: Hurts a little bit Pain Location: R shoulder Pain Descriptors / Indicators: Discomfort;Grimacing;Guarding Pain Intervention(s): Limited activity within patient's tolerance;Monitored during session;Repositioned;Ice applied    Home Living Family/patient expects to be discharged to:: Private residence Living Arrangements: Spouse/significant other Available Help at Discharge: Family;Available 24 hours/day Type of Home: House Home Access: Stairs to enter Entrance Stairs-Rails: None Entrance Stairs-Number of Steps: 3 Home Layout: Two level;Bed/bath upstairs Home Equipment: Tub bench;Walker - 2 wheels;Cane - quad;Hand held shower head      Prior Function Level of Independence: Independent with assistive device(s)         Comments: walk with quad cane in the community, 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     Hand Dominance   Dominant Hand: Left (forced since stroke)    Extremity/Trunk Assessment   Upper Extremity Assessment Upper Extremity Assessment: Defer to OT evaluation RUE Deficits / Details: s/p R TSA RUE Sensation: decreased light touch;decreased proprioception RUE Coordination: decreased fine motor;decreased gross motor    Lower Extremity Assessment Lower Extremity Assessment: [name] deficits/detail [name] Deficits / Details: right leg with decreased strenght and increased spasticity due to PMH of stroke.  Functionally at baseline and compensates well with his TID baclofen (has not had in the hospital).  He reports increased stiffness in R leg due to not moving as much and not having his baclofen this morning.    Cervical / Trunk Assessment Cervical / Trunk Assessment: Normal  Communication   Communication: No difficulties  Cognition Arousal/Alertness: Awake/alert Behavior During Therapy: WFL for tasks  assessed/performed Overall Cognitive Status: History of cognitive impairments - at baseline                                 General Comments: Per significant other, he has some baseline flat affect, higher level executive functioning deficits that is exacerbated with fatigue.      General Comments General comments (skin integrity, edema, etc.): ice applied at the end of the session.  Pt/wife had questions about an UE CPM that they are going to use at home (when to start it), I advised to speak with Dr. Marlou Sa.    Exercises     Assessment/Plan    PT Assessment Patient needs continued PT services  PT Problem List Decreased strength;Decreased range of motion;Decreased activity tolerance;Decreased mobility;Decreased balance;Decreased knowledge of use of DME;Pain;Decreased knowledge of precautions       PT Treatment Interventions Gait training;DME instruction;Stair training;Functional mobility training;Therapeutic activities;Therapeutic exercise;Balance training;Patient/family education;Modalities    PT Goals (Current goals can be found in the Care Plan section)  Acute Rehab PT Goals Patient Stated Goal: home today PT Goal Formulation: With patient Time For Goal Achievement: 12/31/20 Potential to Achieve Goals: Good    Frequency Min 5X/week   Barriers to discharge        Co-evaluation               AM-PAC PT "6  Clicks" Mobility  Outcome Measure Help needed turning from your back to your side while in a flat bed without using bedrails?: A Little Help needed moving from lying on your back to sitting on the side of a flat bed without using bedrails?: A Little Help needed moving to and from a bed to a chair (including a wheelchair)?: A Little Help needed standing up from a chair using your arms (e.g., wheelchair or bedside chair)?: A Little Help needed to walk in hospital room?: A Little Help needed climbing 3-5 steps with a railing? : A Little 6 Click Score:  18    End of Session Equipment Utilized During Treatment: Other (comment);Gait belt (R UE sling) Activity Tolerance: Patient tolerated treatment well Patient left: in chair;with call bell/phone within reach;with family/visitor present   PT Visit Diagnosis: Muscle weakness (generalized) (M62.81);Difficulty in walking, not elsewhere classified (R26.2);Pain Pain - Right/Left: Right Pain - part of body: Shoulder    Time: 8412-8208 PT Time Calculation (min) (ACUTE ONLY): 33 min   Charges:   PT Evaluation $PT Eval Moderate Complexity: 1 Mod PT Treatments $Gait Training: 8-22 mins       Verdene Lennert, PT, DPT  Acute Rehabilitation [phone] pager [phone]) [phone] office

## 2020-12-17 NOTE — Progress Notes (Signed)
Pt given discharge instructions and gone over with him. He verbalized understanding. Pt took ice man home. Wife driving pt home. All belongings taken with pt.

## 2020-12-18 ENCOUNTER — Encounter (HOSPITAL_COMMUNITY): Payer: Self-pay | Admitting: Orthopedic Surgery

## 2020-12-18 NOTE — Op Note (Signed)
NAME: Edward Glass, Edward Glass MEDICAL RECORD NO: [id number] ACCOUNT NO: [id number] DATE OF BIRTH: 1956-07-23 FACILITY: [facility] LOCATION: [facility] PHYSICIAN: Yetta Barre. Marlou Sa, MD  Operative Report   DATE OF PROCEDURE: 12/16/2020  PREOPERATIVE DIAGNOSIS:  Right shoulder arthritis.  POSTOPERATIVE DIAGNOSIS:  Right shoulder arthritis.  PROCEDURE:  Right total shoulder replacement using Biomet components, size 16 micro length porous plasma coated shoulder stem with 46 x 27 modular head, variable offset and medium glenoid base with Regenerex porous titanium post.  SURGEON ATTENDING:  Meredith Pel, MD  ASSISTANT:  Annie Main, PA  INDICATIONS:  The patient is a 65 year old patient with end-stage right shoulder arthritis, who presents for operative management after failure of conservative management and explanation of risks and benefits.  DESCRIPTION OF PROCEDURE:  The patient was brought to the operating room where general anesthetic was induced.  Preoperative antibiotics were administered.  Timeout was called.  The patient was placed in the beach chair position with the head in neutral  position.  Right arm was examined under anesthesia.  The patient found to have about 5 degrees of external rotation and 15 degrees of abduction and about 30 of isolated glenohumeral abduction and about 50 of isolated forward flexion.  Right shoulder, arm  and hand prescrubbed with hydrogen peroxide followed by alcohol and Betadine, which was allowed to air dry, prepped with ChloraPrep solution and draped in sterile manner.  Ioban used to cover the entire operative field.  Tranexamic acid was initiated.   Deltopectoral approach was made.  Skin and subcutaneous tissue were sharply divided.  The patient had a very large deltoid.  No cephalic vein was present.  The subacromial space was developed manually.  The patient had a very tight pectoralis tendon,  which was released upper 2 cm.  Biceps tendon tenodesed to the tendon  using four 0 Vicryl sutures.  Rotator interval was then opened up to the base of the coracoid.  Manually the anterior attachment of the deltoid was released.  The circumflex vessels  were tied.  The axillary nerve was palpated and protected at all times during the case.  At this time, the rotator interval was opened.  Subscapularis was detached from the lesser tuberosity.  Release on the subscapularis was performed circumferentially.   This included the capsule between the subscap and the anterior glenoid.  Care was taken not to injure the axillary nerve during this release.  Next, the capsule was then released off the inferior aspect of the humeral neck about 2 cm down to the 5  o'clock position.  This was to mobilize the humerus away from the glenoid.  Following this, the head was dislocated.  The superior rotator cuff was intact.  Had very mild thinning, but overall was intact.  Next, the canal was entered superiorly.  Reaming  performed up to a size 16 and then a 14 stem was placed and countersunk.  That was done after broaching up to about size 14.  Next, a cap was placed and attention was directed towards the glenoid.  The patient had very hard bone.  Using preop patient  templating and patient specific instrumentation, two guide pins were placed and the glenoid was reamed to about 9 mm.  This gave a very good bleeding bony surface.  Lateral point was anterior inferior about 5 o'clock position on the glenoid.  The  preparation for the baseplate was performed and a trial baseplate was placed.  It was recessed about 2 or 3 mm and excess  bone anterior inferiorly was removed.  Overall, maintained its integrity and the three holes had excellent bone present.  Trial  component was removed and the true component was placed.  Surgicel was placed within the cement peg holes.  Cement was then placed and an excellent press fit was obtained.  Next, attention was directed towards the humerus.  The 14 stem was  removed.  The  head cut was made essentially in line with the patient's own native version.  The head cut measured approximately 48 x 25 mm.  A 47 x 24 trial was placed.  With the trial in position, offset was dialed in to have about 4-5 mm above the level of the  tuberosity.  With the trial in place, this gave very good stability and improved motion.  Had about 50% posterior translation, inferior translation and with the arm in neutral, the head pointed towards the glenoid.  The trial stem was removed.  True stem  placed.  Six SutureTapes were placed for subscap repair.  The true stem was placed and with that in position, the patient had surprisingly some posterior instability.  This was not present during the trial.  A bigger head was trialed and a wider head  was trialed.  These gave marginally satisfactory resolution.  Next, the trial stem was removed and we added about 10 degrees of anteversion and placed a 16 stem instead, which had excellent fit.  Trial reduction with a slightly bigger head, 46 x 27 was  performed and there was no posterior or anterior instability and we were able to get the subscap pulled back and without significantly increased tension.  Next, the true stem was placed and same stability parameters were maintained.  Aimed for about 5 mm  above the superior aspect of the tuberosity.  At that time, excellent stability was achieved anteriorly and posteriorly.  He had about 50% posterior translation with good spring back.  Almost 50% inferior translation.  Thorough irrigation performed at  this time and it should be noted that vancomycin powder was placed along with the IrriSept solution into the humerus  prior to placing the final stem.  Changing the version did improve the posterior instability.  No instability was present after subscap  closure.  Subscap was closed with the arm in about 10 degrees of external rotation.  A good repair was achieved.  Rotator interval was also closed.   Vancomycin powder and IrriSept solution placed both within the joint as well as throughout the field.   Axillary nerve was again palpated and found to be intact.  Rotator interval was then closed using #1 Vicryl suture.  The patient had external rotation to about 30 degrees, isolated glenohumeral abduction to about 80 degrees and forward flexion to about  100 degrees, which is a significant improvement compared to his preoperative state.  Following this, a thorough irrigation was performed.  Deltopectoral interval was closed using #1 Vicryl suture followed by interrupted inverted 0 Vicryl suture, 2-0  Vicryl suture and 3-0 Monocryl.  Steri-Strips and Aquacel dressing applied.  The patient tolerated the procedure well without immediate complications, transferred to the recovery room in stable condition.  Luke's assistance was required at all times  during the case.  His assistance was a medical necessity for opening, closing and limb positioning.   SHW D: 12/16/2020 3:04:53 pm T: 12/16/2020 10:41:00 pm  JOB: [id number]/ [id number]

## 2020-12-18 NOTE — Discharge Summary (Signed)
Physician Discharge Summary      Patient ID: Edward Glass MRN: [id number] DOB/AGE: 11/17/1955 65 y.o.  Admit date: 12/16/2020 Discharge date: 12/17/2020  Admission Diagnoses:  Active Problems:   S/P shoulder replacement, right   Discharge Diagnoses:  Same  Surgeries: Procedure(s): RIGHT TOTAL SHOULDER ARTHROPLASTY on 12/16/2020   Consultants:   Discharged Condition: Stable  Hospital Course: Edward Glass is an 65 y.o. male who was admitted 12/16/2020 with a chief complaint of right shoulder pain, and found to have a diagnosis of right shoulder arthritis.  They were brought to the operating room on 12/16/2020 and underwent the above named procedures.  Pt awoke from anesthesia without complication and was transferred to the floor. On POD1, patient's pain was well controlled and block was still in effect.  He had no lightheadedness or dizziness when he ambulated.  He was able to mobilize well.  He was discharged home on POD 1..  Pt will f/u with Dr. Marlou Sa in clinic in ~2 weeks.   Antibiotics given:  Anti-infectives (From admission, onward)   Start     Dose/Rate Route Frequency Ordered Stop   12/16/20 2030  ceFAZolin (ANCEF) IVPB 1 g/50 mL premix        1 g 100 mL/hr over 30 Minutes Intravenous Every 8 hours 12/16/20 1557 12/17/20 1132   12/16/20 1117  vancomycin (VANCOCIN) powder  Status:  Discontinued          As needed 12/16/20 1117 12/16/20 1430   12/16/20 0600  ceFAZolin (ANCEF) IVPB 2g/100 mL premix        2 g 200 mL/hr over 30 Minutes Intravenous On call to O.R. 12/16/20 0542 12/16/20 1255    .  Recent vital signs:  Vitals:   12/17/20 0804 12/17/20 1126  BP: 129/68 119/63  Pulse: 66 64  Resp: 18 18  Temp: 97.9 F (36.6 C) 98 F (36.7 C)  SpO2: 95% 100%    Recent laboratory studies:  Results for orders placed or performed during the hospital encounter of 12/12/20  SARS CORONAVIRUS 2 (TAT 6-24 HRS) Nasopharyngeal Nasopharyngeal Swab   Specimen:  Nasopharyngeal Swab  Result Value Ref Range   SARS Coronavirus 2 NEGATIVE NEGATIVE    Discharge Medications:   Allergies as of 12/17/2020   No Known Allergies     Medication List    TAKE these medications   acetaminophen 325 MG tablet Commonly known as: TYLENOL Take 2 tablets (650 mg total) by mouth every 4 (four) hours as needed for mild pain (or temp > 37.5 C (99.5 F)).   amLODipine 5 MG tablet Commonly known as: NORVASC Take 5 mg by mouth daily.   atorvastatin 40 MG tablet Commonly known as: LIPITOR Take 1 tablet (40 mg total) by mouth daily at 6 PM.   Baclofen 5 MG Tabs Take 5 mg by mouth 4 (four) times daily as needed. What changed: when to take this   clopidogrel 75 MG tablet Commonly known as: PLAVIX Take 1 tablet (75 mg total) by mouth daily.   diclofenac sodium 1 % Gel Commonly known as: VOLTAREN Apply 2 g topically 4 (four) times daily. What changed: when to take this   multivitamin Tabs tablet Take 1 tablet by mouth daily.   oxyCODONE 5 MG immediate release tablet Commonly known as: Oxy IR/ROXICODONE Take 1 tablet (5 mg total) by mouth every 4 (four) hours as needed for moderate pain (pain score 4-6).       Diagnostic Studies: DG Shoulder Right  Port  Result Date: 12/16/2020 CLINICAL DATA:  65 year old male with right shoulder arthroplasty. EXAM: PORTABLE RIGHT SHOULDER COMPARISON:  Radiograph dated 01/24/2020. FINDINGS: Single radiograph of the right shoulder provided. There is a right shoulder arthroplasty. There is no acute fracture or dislocation. The bones are osteopenic. The soft tissues are unremarkable. IMPRESSION: Right shoulder arthroplasty. No acute fracture or dislocation. Electronically Signed   By: Anner Crete M.D.   On: 12/16/2020 15:39   CUP PACEART REMOTE DEVICE CHECK  Result Date: 11/25/2020 ILR summary report received. Battery status OK. Normal device function. No new symptom, tachy, brady, or pause episodes. 1 new AF episodes. EGM  most likely noise. Monthly summary reports and ROV/PRN. LH   Disposition: Discharge disposition: 01-Home or Self Care       Discharge Instructions    Call MD / Call 911   Complete by: As directed    If you experience chest pain or shortness of breath, CALL 911 and be transported to the hospital emergency room.  If you develope a fever above 101 F, pus (white drainage) or increased drainage or redness at the wound, or calf pain, call your surgeon's office.   Constipation Prevention   Complete by: As directed    Drink plenty of fluids.  Prune juice may be helpful.  You may use a stool softener, such as Colace (over the counter) 100 mg twice a day.  Use MiraLax (over the counter) for constipation as needed.   Diet - low sodium heart healthy   Complete by: As directed    Discharge instructions   Complete by: As directed    You may shower, dressing is waterproof.  Do not bathe or soak the operative shoulder in a tub, pool.  Use the CPM machine 3 times a day for one hour each time, increasing the degrees of range of motion with each session.  Best to use the ice machine for ~60 mins after using CPM machine to help with swelling.  Do not use longer than 60 mins in a row. No lifting with the operative shoulder. Continue use of the sling when you are out of the home especially.  Follow-up with Dr. Marlou Sa in ~2 weeks on your given appointment date.  We will remove your adhesive bandage at that time.  Call the office at [phone] with any questions or concerns.    Dental Antibiotics:  In most cases prophylactic antibiotics for Dental procdeures after total joint surgery are not necessary.  Exceptions are as follows:  1. History of prior total joint infection  2. Severely immunocompromised (Organ Transplant, cancer chemotherapy, Rheumatoid biologic meds such as Jenera)  3. Poorly controlled diabetes (A1C &gt; 8.0, blood glucose over 200)  If you have one of these conditions, contact your  surgeon for an antibiotic prescription, prior to your dental procedure.   Increase activity slowly as tolerated   Complete by: As directed    Post-operative opioid taper instructions:   Complete by: As directed    POST-OPERATIVE OPIOID TAPER INSTRUCTIONS: It is important to wean off of your opioid medication as soon as possible. If you do not need pain medication after your surgery it is ok to stop day one. Opioids include: Codeine, Hydrocodone(Norco, Vicodin), Oxycodone(Percocet, oxycontin) and hydromorphone amongst others.  Long term and even short term use of opiods can cause: Increased pain response Dependence Constipation Depression Respiratory depression And more.  Withdrawal symptoms can include Flu like symptoms Nausea, vomiting And more Techniques to manage these symptoms Hydrate  well Eat regular healthy meals Stay active Use relaxation techniques(deep breathing, meditating, yoga) Do Not substitute Alcohol to help with tapering If you have been on opioids for less than two weeks and do not have pain than it is ok to stop all together.  Plan to wean off of opioids This plan should start within one week post op of your joint replacement. Maintain the same interval or time between taking each dose and first decrease the dose.  Cut the total daily intake of opioids by one tablet each day Next start to increase the time between doses. The last dose that should be eliminated is the evening dose.            SignedDonella Stade 12/18/2020, 8:31 AM

## 2020-12-18 NOTE — Progress Notes (Signed)
  Subjective: Edward Glass is a 65 y.o. male s/p right TSA.  They are POD 1.  Pt's pain is controlled.  Block is still in effect.  Patient denies any fevers, chills, lightheadedness, dizziness, chest pain, shortness of breath.  He does have CPM machine at home..    Objective: Vital signs in last 24 hours: Temp:  [98 F (36.7 C)] 98 F (36.7 C) (05/25 1126) Pulse Rate:  [64] 64 (05/25 1126) Resp:  [18] 18 (05/25 1126) BP: (119)/(63) 119/63 (05/25 1126) SpO2:  [100 %] 100 % (05/25 1126)  Intake/Output from previous day: 05/25 0701 - 05/26 0700 In: 240 [P.O.:240] Out: 500 [Urine:500] Intake/Output this shift: Total I/O In: 2495.2 [P.O.:240; I.V.:2235.2; IV Piggyback:20] Out: 3550 [Urine:3300; Blood:250]  Exam:  No gross blood or drainage overlying the dressing 2+ radial pulse Sensation intact but diminished distally in the right hand Able to extend the right wrist Axillary nerve intact with deltoid firing   Labs: No results for input(s): HGB in the last 72 hours. No results for input(s): WBC, RBC, HCT, PLT in the last 72 hours. No results for input(s): NA, K, CL, CO2, BUN, CREATININE, GLUCOSE, CALCIUM in the last 72 hours. No results for input(s): LABPT, INR in the last 72 hours.  Assessment/Plan: Pt is POD 1 s/p right TSA    -Plan to discharge to home today pending patient's pain  -No concern for patient after discussing with patient's nurse.  -No lifting with the operative arm      Edward Glass 12/18/2020, 8:36 AM

## 2020-12-23 ENCOUNTER — Telehealth: Payer: Self-pay

## 2020-12-23 ENCOUNTER — Telehealth: Payer: Self-pay | Admitting: Orthopedic Surgery

## 2020-12-23 NOTE — Telephone Encounter (Signed)
[place] with [employer] called stating she will need a CB to get additional info on why the pt needs home health services; Caryl Comes would like a CB as soon as possible please.   [phone]

## 2020-12-23 NOTE — Telephone Encounter (Signed)
IC advised s/p TSA on 05/24.  She stated that was all of the information she needed

## 2020-12-23 NOTE — Telephone Encounter (Signed)
Sonia Side with [facility] called stated he is needing parameters for patients HHPT s/p TSA. Please advise. Thanks.

## 2020-12-23 NOTE — Telephone Encounter (Signed)
No external rotation more than 20 degrees.  Forward flexion below 90 abduction below 90 for the first 2 weeks and further instructions to follow.  No strengthening.

## 2020-12-24 LAB — CUP PACEART REMOTE DEVICE CHECK
Date Time Interrogation Session: 20220601011805
Implantable Pulse Generator Implant Date: 20200210

## 2020-12-24 NOTE — Telephone Encounter (Signed)
IC advised.  

## 2020-12-25 ENCOUNTER — Ambulatory Visit (INDEPENDENT_AMBULATORY_CARE_PROVIDER_SITE_OTHER): Payer: Medicare Other

## 2020-12-25 DIAGNOSIS — I63512 Cerebral infarction due to unspecified occlusion or stenosis of left middle cerebral artery: Secondary | ICD-10-CM | POA: Diagnosis not present

## 2020-12-27 DIAGNOSIS — M19011 Primary osteoarthritis, right shoulder: Secondary | ICD-10-CM

## 2020-12-31 ENCOUNTER — Ambulatory Visit: Payer: Self-pay

## 2020-12-31 ENCOUNTER — Ambulatory Visit (INDEPENDENT_AMBULATORY_CARE_PROVIDER_SITE_OTHER): Payer: Medicare Other | Admitting: Orthopedic Surgery

## 2020-12-31 ENCOUNTER — Encounter: Payer: Self-pay | Admitting: Orthopedic Surgery

## 2020-12-31 ENCOUNTER — Other Ambulatory Visit: Payer: Self-pay

## 2020-12-31 DIAGNOSIS — Z96611 Presence of right artificial shoulder joint: Secondary | ICD-10-CM | POA: Diagnosis not present

## 2021-01-02 NOTE — Progress Notes (Signed)
Post-Op Visit Note   Patient: Edward Glass           Date of Birth: 03/14/1956           MRN: [id number] Visit Date: 12/31/2020 PCP: Katherina Mires, MD   Assessment & Plan:  Chief Complaint:  Chief Complaint  Patient presents with   Right Shoulder - Routine Post Op   Visit Diagnoses:  1. History of arthroplasty of right shoulder     Plan: Edward Glass is a 65 year old patient with right total shoulder replacement 12/16/2020.  On exam deltoid fires and the incision looks good.  External rotation is about 20 degrees with good endpoint.  Subscap repair was solid but under some tension due to contracture of the muscle contraction.  CPM is at 93 times a day.  Taking Motrin for pain.  Deltoid fires.  Plan is therapy here to start in 2 weeks.  In the meantime stay in sling would do the CPM 3 hours a day.  Come back in 4 weeks for clinical recheck.  I do not think that he is going to be 1 to get the standard excellent shoulder range of motion based on the amount of contracture present.  Nonetheless I do think it should be a good pain relieving operation for him.  I do want to protect that subscap repair for the first 6 weeks. Follow-Up Instructions: Return in about 4 weeks (around 01/28/2021).   Orders:  Orders Placed This Encounter  Procedures   XR Shoulder Right   Ambulatory referral to Physical Therapy   No orders of the defined types were placed in this encounter.   Imaging: No results found.  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   Slow transit constipation    Benign prostatic hyperplasia with urinary retention    Spastic hemiparesis of right dominant side (HCC)    Chronic right shoulder pain    Primary osteoarthritis of right shoulder    Left basal ganglia embolic stroke ([place]) 49/17/9150   CVA (cerebral vascular accident) ([place]) 08/31/2018   Elevated aspartate  aminotransferase level 06/16/2015   H/O nutritional disorder 06/16/2015   Excessive salivation 06/16/2015   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   Heart palpitations 06/16/2015   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   Hypogonadism male 08/21/2014   Screening for prostate cancer 08/21/2014   Atypical chest pain 08/10/2013   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/2012   Past Medical History:  Diagnosis Date   Arthritis    right shoulder   Atypical chest pain 08/10/2013   BPH (benign prostatic hyperplasia)    COVID-19 07/2020   CVA (cerebral vascular accident) ([place]) 08/2018   spastic hemiparesis of right dominant side   Dysphagia    Elevated aspartate aminotransferase level 06/16/2015   Excessive salivation 06/16/2015   H/O nutritional disorder 06/16/2015   H/O vitamin D deficiency    History of loop recorder 2020   Hx of adenomatous colonic polyps 2016   Hyperlipidemia    Hypertension    Hypogonadism male 08/21/2014   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   Screening for prostate cancer 08/21/2014   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    Family History  Problem Relation Age of Onset   Kidney failure Mother    Cancer Mother    Prostate cancer Father    Cancer Father    Diabetes Father    Colon cancer Neg Hx  Esophageal cancer Neg Hx    Rectal cancer Neg Hx    Stomach cancer Neg Hx     Past Surgical History:  Procedure Laterality Date   COLONOSCOPY     HERNIA REPAIR  2006   left tricep surgery     LOOP RECORDER INSERTION N/A 09/04/2018   Procedure: LOOP RECORDER INSERTION;  Surgeon: Evans Lance, MD;  Location: [facility];  Service: Cardiovascular;  Laterality: N/A;   TEE WITHOUT CARDIOVERSION N/A 09/04/2018   Procedure: TRANSESOPHAGEAL ECHOCARDIOGRAM (TEE);  Surgeon: Jerline Pain, MD;  [facility];  Service: Cardiovascular;  Laterality: N/A;   TOTAL SHOULDER ARTHROPLASTY Right 12/16/2020   Procedure: RIGHT TOTAL SHOULDER ARTHROPLASTY;  Surgeon: Meredith Pel, MD;  Location: [facility];   Service: Orthopedics;  Laterality: Right;   Social History   Occupational History   Occupation: Physiological scientist  Tobacco Use   Smoking status: Never   Smokeless tobacco: Never  Vaping Use   Vaping Use: Never used  Substance and Sexual Activity   Alcohol use: No   Drug use: No   Sexual activity: Not on file

## 2021-01-07 ENCOUNTER — Encounter: Payer: Self-pay | Admitting: Rehabilitative and Restorative Service Providers"

## 2021-01-07 ENCOUNTER — Ambulatory Visit: Payer: Medicare Other | Admitting: Rehabilitative and Restorative Service Providers"

## 2021-01-07 ENCOUNTER — Other Ambulatory Visit: Payer: Self-pay

## 2021-01-07 DIAGNOSIS — M6281 Muscle weakness (generalized): Secondary | ICD-10-CM

## 2021-01-07 DIAGNOSIS — R600 Localized edema: Secondary | ICD-10-CM

## 2021-01-07 DIAGNOSIS — G8929 Other chronic pain: Secondary | ICD-10-CM

## 2021-01-07 DIAGNOSIS — M25611 Stiffness of right shoulder, not elsewhere classified: Secondary | ICD-10-CM

## 2021-01-07 DIAGNOSIS — M25511 Pain in right shoulder: Secondary | ICD-10-CM | POA: Diagnosis not present

## 2021-01-07 DIAGNOSIS — R293 Abnormal posture: Secondary | ICD-10-CM

## 2021-01-07 NOTE — Patient Instructions (Signed)
Access Code: [email or web address] URL: [email or web address] Date: 01/07/2021 Prepared by: Scot Jun  Exercises Seated Scapular Retraction - 2 x daily - 7 x weekly - 2 sets - 10 reps - 5 hold Supine Shoulder Flexion PROM (Mirrored) - 2-3 x daily - 7 x weekly - 1-2 sets - 10 reps - 5 hold Supine Elbow Flexion Extension AROM - 2-3 x daily - 7 x weekly - 3 sets - 10 reps Seated Shoulder Flexion Towel Slide at Table Top (Mirrored) - 2 x daily - 7 x weekly - 2-3 sets - 10 reps - 5 hold Seated Shoulder Scaption Slide at Table Top with Forearm in Neutral - 2 x daily - 7 x weekly - 2 sets - 10 reps - 5 hold

## 2021-01-07 NOTE — Therapy (Addendum)
[facility] [address] [place], [place], [address] Phone: [phone]   Fax:  [phone]  Physical Therapy Evaluation  Patient Details  Name: Edward Glass MRN: [id number] Date of Birth: 06-16-1956 Referring Provider (PT): Dr. Marlou Sa   Encounter Date: 01/07/2021   PT End of Session - 01/07/21 1427     Visit Number 1    Number of Visits 16    Date for PT Re-Evaluation 03/18/21    Authorization Type BCBS    Progress Note Due on Visit 10    PT Start Time 1430    PT Stop Time 1508    PT Time Calculation (min) 38 min    Activity Tolerance Patient tolerated treatment well    Behavior During Therapy [facility] for tasks assessed/performed             Past Medical History:  Diagnosis Date   Arthritis    right shoulder   Atypical chest pain 08/10/2013   BPH (benign prostatic hyperplasia)    COVID-19 07/2020   CVA (cerebral vascular accident) ([place]) 08/2018   spastic hemiparesis of right dominant side   Dysphagia    Elevated aspartate aminotransferase level 06/16/2015   Excessive salivation 06/16/2015   H/O nutritional disorder 06/16/2015   H/O vitamin D deficiency    History of loop recorder 2020   Hx of adenomatous colonic polyps 2016   Hyperlipidemia    Hypertension    Hypogonadism male 08/21/2014   Leg varices 02/07/2012   Screening for prostate cancer 08/21/2014   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    Past Surgical History:  Procedure Laterality Date   COLONOSCOPY     HERNIA REPAIR  2006   left tricep surgery     LOOP RECORDER INSERTION N/A 09/04/2018   Procedure: LOOP RECORDER INSERTION;  Surgeon: Evans Lance, MD;  Location: [place] [facility];  Service: Cardiovascular;  Laterality: N/A;   TEE WITHOUT CARDIOVERSION N/A 09/04/2018   Procedure: TRANSESOPHAGEAL ECHOCARDIOGRAM (TEE);  Surgeon: Jerline Pain, MD;  Location: [facility];  Service: Cardiovascular;  Laterality: N/A;   TOTAL SHOULDER ARTHROPLASTY Right 12/16/2020    Procedure: RIGHT TOTAL SHOULDER ARTHROPLASTY;  Surgeon: Meredith Pel, MD;  Location: [place];  Service: Orthopedics;  Laterality: Right;    There were no vitals filed for this visit.    Subjective Assessment - 01/07/21 1432     Subjective Pt. comes to clinic s/p recent Rt TSA 12/16/2020.  History of stroke with Rt side involvement (noted in Rt LE, UE).  Pt. indicated he has machine at home to get arm moving.  Pt. arrived wearing sling except for exercise use.    Pertinent History CVA 2020    Limitations House hold activities;Lifting    Patient Stated Goals Pain reduce, reach overhead    Currently in Pain? No/denies    Pain Score 0-No pain   pain at worst 5/10   Pain Location Shoulder    Pain Orientation Right    Pain Descriptors / Indicators Aching;Sore    Pain Type Chronic pain;Surgical pain    Pain Onset More than a month ago    Pain Frequency Intermittent    Aggravating Factors  end range, trying to lift arm    Pain Relieving Factors Tylenol                [facility] PT Assessment - 01/07/21 0001       Assessment   Medical Diagnosis Rt TSA    Referring Provider (PT) Dr.  Dean    Onset Date/Surgical Date 12/16/20    Hand Dominance Right      Precautions   Precautions Shoulder    Type of Shoulder Precautions Rt TSA - limited forced ER 6 weeks, active IR 6 weeks from 12/31/2020 for subscap repair      Balance Screen   Has the patient fallen in the past 6 months No    Has the patient had a decrease in activity level because of a fear of falling?  No    Is the patient reluctant to leave their home because of a fear of falling?  No      Home Ec[employer] residence      Prior Function   Vocation Requirements Personal trainer    Leisure shoot pool , lifting      Observation/Other Assessments   Focus on Therapeutic Outcomes (FOTO)  intake 30%, predicted 63%      Observation/Other Assessments-Edema    Edema --   localized edema Rt shoulder      Posture/Postural Control   Posture/Postural Control Postural limitations    Postural Limitations Rounded Shoulders;Forward head;Increased thoracic kyphosis    Posture Comments Rt shoulder in sling upon arrival      ROM / Strength   AROM / PROM / Strength Strength;PROM;AROM      AROM   Overall AROM Comments Held due to pain symptoms, will recheck at later date.  Lt shoulder WFL at this time    AROM Assessment Site Shoulder    Right/Left Shoulder Left;Right      PROM   Overall PROM Comments measured in supine, pain limited c joint mobility restriction at end ranges all directions Rt shoulder (limited pressure fo ER per MD note about subscapularis)    PROM Assessment Site Shoulder    Right/Left Shoulder Left;Right    Right Shoulder Flexion 45 Degrees    Right Shoulder ABduction 54 Degrees    Right Shoulder Internal Rotation 45 Degrees   in supine 30 deg abduction   Right Shoulder External Rotation 16 Degrees   in supine 30 deg abduction     Strength   Overall Strength Comments Formal MMT Rt shoulder held due to pain, surgical intervention. Will test later    Strength Assessment Site Shoulder    Right/Left Shoulder Left;Right    Right Shoulder Flexion 1/5    Left Shoulder Flexion 5/5   16.4, 15.4 lbs   Left Shoulder ABduction 5/5   14.8, 14.9 lbs   Left Shoulder Internal Rotation 5/5    Left Shoulder External Rotation 5/5   18.6, 21 lbs     Palpation   Palpation comment Noted capsular restriction in all directions Rt shoulder                        Objective measurements completed on examination: See above findings.       [facility] Adult PT Treatment/Exercise - 01/07/21 0001       Exercises   Exercises Other Exercises;Shoulder    Other Exercises  HEP instruction/performance c cues for techniques, handout provided.  Trial set performed of each for comprehension and symptom assessment.      Shoulder Exercises: Supine   Other Supine Exercises passive Rt  shoulder flexion to tolerance x 10 c Lt arm help, elbow flexion/extension x 10      Shoulder Exercises: Stretch   Other Shoulder Stretches table slides Rt shoulder flexion, scaption 5 sec hold  x 10    Other Shoulder Stretches seated scapular retraction 5 sec hold x 5      Manual Therapy   Manual therapy comments PROM flexion, scaption, abduction Rt shoulder, IR gentle                    PT Education - 01/07/21 1511     Education Details HEP, POC    Person(s) Educated Patient    Methods Explanation;Demonstration;Verbal cues;Handout    Comprehension Verbalized understanding;Returned demonstration              PT Short Term Goals - 01/07/21 1424       PT SHORT TERM GOAL #1   Title Patient will demonstrate independent use of home exercise program to maintain progress from in clinic treatments.    Time 3    Period Weeks    Status New    Target Date 01/28/21               PT Long Term Goals - 01/07/21 1424       PT LONG TERM GOAL #1   Title Patient will demonstrate/report pain at worst less than or equal to 2/10 to facilitate minimal limitation in daily activity secondary to pain symptoms.    Time 10    Period Weeks    Status New    Target Date 03/18/21      PT LONG TERM GOAL #2   Title Patient will demonstrate independent use of home exercise program to facilitate ability to maintain/progress functional gains from skilled physical therapy services.    Time 10    Period Weeks    Status New    Target Date 03/18/21      PT LONG TERM GOAL #3   Title Pt. will demonstrate FOTO outcome > or = 63 to indicate reduced disability due to condition.    Time 10    Period Weeks    Status New    Target Date 03/18/21      PT LONG TERM GOAL #4   Title Pt. will demonstrate Rt glenohumural AROM within 75% of Lt to faciltiate usual dressing, overhead reaching in daily activity at PLOF.    Time 10    Period Weeks    Status New    Target Date 03/18/21      PT  LONG TERM GOAL #5   Title Pt. will demonstrate Rt shoulder MMT > or = 4/5, dynamometry with 15% of Lt to facilitate lifting, reaching activity of daily life at PLOF.    Time 10    Period Weeks    Status New    Target Date 03/18/21                    Plan - 01/07/21 1422     Clinical Impression Statement Patient is a 65 y.o. who comes to clinic with complaints of Rt shoulder pain with mobility, strength and movement coordination deficits s/p R TSA that impair their ability to perform usual daily and recreational functional activities without increase difficulty/symptoms at this time.  Patient to benefit from skilled PT services to address impairments and limitations to improve to previous level of function without restriction secondary to condition.    Personal Factors and Comorbidities Comorbidity 3+    Comorbidities CVA 2020 hemiparesis Rt side, hyperlipidemia, HTN,    Examination-Activity Limitations Bathing;Bed Mobility;Bend;Carry;Toileting;Transfers;Dressing;Hygiene/Grooming;Lift;Reach Overhead;Self Feeding    Examination-Participation Restrictions Occupation;Meal Prep;Laundry;Driving;Community Activity;Cleaning;Shop    Stability/Clinical Decision Making Stable/Uncomplicated  Clinical Decision Making Low    Rehab Potential --   fair to good   PT Frequency 2x / week    PT Duration Other (comment)   10 weeks   PT Treatment/Interventions ADLs/Self Care Home Management;Cryotherapy;Electrical Stimulation;Iontophoresis 4mg /ml Dexamethasone;Moist Heat;Balance training;Therapeutic exercise;Manual techniques;Therapeutic activities;Functional mobility training;Stair training;Gait training;DME Instruction;Ultrasound;Neuromuscular re-education;Patient/family education;Passive range of motion;Dry needling;Joint Manipulations;Vasopneumatic Device;Taping    PT Next Visit Plan Rt TSA - limited forced ER 6 weeks, active IR 6 weeks from 12/31/2020 for subscap repair.  Continue Rt shoulder passive  mobility gains, AAROM gain at this time in manual and ther ex, possible vaso use.    PT Home Exercise Plan [id number]    [id number] and Agree with Plan of Care Patient             Patient will benefit from skilled therapeutic intervention in order to improve the following deficits and impairments:  Hypomobility, Pain, Impaired UE functional use, Increased fascial restricitons, Decreased strength, Decreased activity tolerance, Increased edema, Decreased mobility, Improper body mechanics, Impaired perceived functional ability, Impaired flexibility, Decreased range of motion, Decreased coordination, Decreased endurance, Postural dysfunction  Visit Diagnosis: Chronic right shoulder pain  Muscle weakness (generalized)  Abnormal posture  Localized edema  Stiffness of right shoulder, not elsewhere classified     Problem List Patient Active Problem List   Diagnosis Date Noted   Arthritis of right shoulder region    S/P shoulder replacement, 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   Slow transit constipation    Benign prostatic hyperplasia with urinary retention    Spastic hemiparesis of right dominant side (HCC)    Chronic right shoulder pain    Primary osteoarthritis of right shoulder    Left basal ganglia embolic stroke ([place]) 90/38/3338   CVA (cerebral vascular accident) ([place]) 08/31/2018   Elevated aspartate aminotransferase level 06/16/2015   H/O nutritional disorder 06/16/2015   Excessive salivation 06/16/2015   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 [place], [place], [address] Phone: [phone]   Fax:  [phone]  Name: Edward Glass MRN: [id number] Date of Birth:  07/18/1956

## 2021-01-13 ENCOUNTER — Ambulatory Visit: Payer: Medicare Other | Admitting: Physical Therapy

## 2021-01-13 ENCOUNTER — Other Ambulatory Visit: Payer: Self-pay

## 2021-01-13 DIAGNOSIS — M25511 Pain in right shoulder: Secondary | ICD-10-CM | POA: Diagnosis not present

## 2021-01-13 DIAGNOSIS — M6281 Muscle weakness (generalized): Secondary | ICD-10-CM | POA: Diagnosis not present

## 2021-01-13 DIAGNOSIS — R293 Abnormal posture: Secondary | ICD-10-CM

## 2021-01-13 DIAGNOSIS — R600 Localized edema: Secondary | ICD-10-CM

## 2021-01-13 DIAGNOSIS — G8929 Other chronic pain: Secondary | ICD-10-CM

## 2021-01-13 DIAGNOSIS — M25611 Stiffness of right shoulder, not elsewhere classified: Secondary | ICD-10-CM

## 2021-01-13 NOTE — Therapy (Signed)
[facility] Therapy [address] [place], [place], [address] Phone: [phone]   Fax:  [phone]  Physical Therapy Treatment  Patient Details  Name: Edward Glass MRN: [id number] Date of Birth: 1956-06-28 Referring Provider (PT): Dr. Marlou Sa   Encounter Date: 01/13/2021   PT End of Session - 01/13/21 1225     Visit Number 2    Number of Visits 16    Date for PT Re-Evaluation 03/04/21    Authorization Type BCBS    Progress Note Due on Visit 10    PT Start Time 1148    PT Stop Time 1230    PT Time Calculation (min) 42 min    Activity Tolerance Patient tolerated treatment well    Behavior During Therapy [facility] for tasks assessed/performed             Past Medical History:  Diagnosis Date   Arthritis    right shoulder   Atypical chest pain 08/10/2013   BPH (benign prostatic hyperplasia)    COVID-19 07/2020   CVA (cerebral vascular accident) ([place]) 08/2018   spastic hemiparesis of right dominant side   Dysphagia    Elevated aspartate aminotransferase level 06/16/2015   Excessive salivation 06/16/2015   H/O nutritional disorder 06/16/2015   H/O vitamin D deficiency    History of loop recorder 2020   Hx of adenomatous colonic polyps 2016   Hyperlipidemia    Hypertension    Hypogonadism male 08/21/2014   Leg varices 02/07/2012   Screening for prostate cancer 08/21/2014   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    Past Surgical History:  Procedure Laterality Date   COLONOSCOPY     HERNIA REPAIR  2006   left tricep surgery     LOOP RECORDER INSERTION N/A 09/04/2018   Procedure: LOOP RECORDER INSERTION;  Surgeon: Evans Lance, MD;  Location: [place] [facility];  Service: Cardiovascular;  Laterality: N/A;   TEE WITHOUT CARDIOVERSION N/A 09/04/2018   Procedure: TRANSESOPHAGEAL ECHOCARDIOGRAM (TEE);  Surgeon: Jerline Pain, MD;  Location: [facility];  Service: Cardiovascular;  Laterality: N/A;   TOTAL SHOULDER ARTHROPLASTY Right 12/16/2020    Procedure: RIGHT TOTAL SHOULDER ARTHROPLASTY;  Surgeon: Meredith Pel, MD;  Location: [place];  Service: Orthopedics;  Laterality: Right;    There were no vitals filed for this visit.   Subjective Assessment - 01/13/21 1157     Subjective Pt. relays not too much pain upon arrival in his Rt shoulder.    Pertinent History CVA 2020    Limitations House hold activities;Lifting    Patient Stated Goals Pain reduce, reach overhead    Pain Onset More than a month ago               [facility] Adult PT Treatment/Exercise - 01/13/21 0001       Shoulder Exercises: Supine   External Rotation AAROM;Right;20 reps    Flexion AAROM;Right;20 reps    ABduction AAROM;Right;20 reps      Shoulder Exercises: Standing   Other Standing Exercises pendulum circles X20 CW,CCW      Shoulder Exercises: Pulleys   Flexion 2 minutes    ABduction 2 minutes      Modalities   Modalities Vasopneumatic      Vasopneumatic   Number Minutes Vasopneumatic  10 minutes    Vasopnuematic Location  Shoulder    Vasopneumatic Pressure Medium    Vasopneumatic Temperature  34      Manual Therapy   Manual therapy comments PROM flexion, scaption, abduction Rt  shoulder, IR and ER gentle                      PT Short Term Goals - 01/07/21 1424       PT SHORT TERM GOAL #1   Title Patient will demonstrate independent use of home exercise program to maintain progress from in clinic treatments.    Time 3    Period Weeks    Status New    Target Date 01/28/21               PT Long Term Goals - 01/07/21 1424       PT LONG TERM GOAL #1   Title Patient will demonstrate/report pain at worst less than or equal to 2/10 to facilitate minimal limitation in daily activity secondary to pain symptoms.    Time 10    Period Weeks    Status New    Target Date 03/18/21      PT LONG TERM GOAL #2   Title Patient will demonstrate independent use of home exercise program to facilitate ability to  maintain/progress functional gains from skilled physical therapy services.    Time 10    Period Weeks    Status New    Target Date 03/18/21      PT LONG TERM GOAL #3   Title Pt. will demonstrate FOTO outcome > or = 63 to indicate reduced disability due to condition.    Time 10    Period Weeks    Status New    Target Date 03/18/21      PT LONG TERM GOAL #4   Title Pt. will demonstrate Rt glenohumural AROM within 75% of Lt to faciltiate usual dressing, overhead reaching in daily activity at PLOF.    Time 10    Period Weeks    Status New    Target Date 03/18/21      PT LONG TERM GOAL #5   Title Pt. will demonstrate Rt shoulder MMT > or = 4/5, dynamometry with 15% of Lt to facilitate lifting, reaching activity of daily life at PLOF.    Time 10    Period Weeks    Status New    Target Date 03/18/21                   Plan - 01/13/21 1226     Clinical Impression Statement Focused on ROM today with precautions of no Active IR and limited ER ROM due to subscap repair. Recommended shoulder pulleys at home with continued CPM use to maximize his ROM.    Personal Factors and Comorbidities Comorbidity 3+    Comorbidities CVA 2020 hemiparesis Rt side, hyperlipidemia, HTN,    Examination-Activity Limitations Bathing;Bed Mobility;Bend;Carry;Toileting;Transfers;Dressing;Hygiene/Grooming;Lift;Reach Overhead;Self Feeding    Examination-Participation Restrictions Occupation;Meal Prep;Laundry;Driving;Community Activity;Cleaning;Shop    Stability/Clinical Decision Making Stable/Uncomplicated    Rehab Potential --   fair to good   PT Frequency 2x / week    PT Duration Other (comment)   10 weeks   PT Treatment/Interventions ADLs/Self Care Home Management;Cryotherapy;Electrical Stimulation;Iontophoresis 4mg /ml Dexamethasone;Moist Heat;Balance training;Therapeutic exercise;Manual techniques;Therapeutic activities;Functional mobility training;Stair training;Gait training;DME  Instruction;Ultrasound;Neuromuscular re-education;Patient/family education;Passive range of motion;Dry needling;Joint Manipulations;Vasopneumatic Device;Taping    PT Next Visit Plan Rt TSA - limited forced ER 6 weeks, active IR 6 weeks from 12/31/2020 for subscap repair.  Continue Rt shoulder passive mobility gains, AAROM gain at this time in manual and ther ex, possible vaso use.    PT Home Exercise Plan [id number], shoulder pulleys  Consulted and Agree with Plan of Care Patient             Patient will benefit from skilled therapeutic intervention in order to improve the following deficits and impairments:  Hypomobility, Pain, Impaired UE functional use, Increased fascial restricitons, Decreased strength, Decreased activity tolerance, Increased edema, Decreased mobility, Improper body mechanics, Impaired perceived functional ability, Impaired flexibility, Decreased range of motion, Decreased coordination, Decreased endurance, Postural dysfunction  Visit Diagnosis: Chronic right shoulder pain  Muscle weakness (generalized)  Abnormal posture  Localized edema  Stiffness of right shoulder, not elsewhere classified     Problem List Patient Active Problem List   Diagnosis Date Noted   Arthritis of right shoulder region    S/P shoulder replacement, 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   Slow transit constipation    Benign prostatic hyperplasia with urinary retention    Spastic hemiparesis of right dominant side (HCC)    Chronic right shoulder pain    Primary osteoarthritis of right shoulder    Left basal ganglia embolic stroke ([place]) 35/70/1779   CVA (cerebral vascular accident) ([place]) 08/31/2018   Elevated aspartate aminotransferase level 06/16/2015   H/O nutritional disorder 06/16/2015   Excessive salivation 06/16/2015   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 [place], [place], [address] Phone: [phone]   Fax:  [phone]  Name: Edward Glass MRN: [id number] Date of Birth: 18-Nov-1955

## 2021-01-15 ENCOUNTER — Encounter: Payer: Self-pay | Admitting: Adult Health

## 2021-01-15 ENCOUNTER — Ambulatory Visit: Payer: Medicare Other | Admitting: Adult Health

## 2021-01-15 VITALS — BP 137/81 | HR 67 | Ht 65.0 in | Wt 166.0 lb

## 2021-01-15 DIAGNOSIS — Z8673 Personal history of transient ischemic attack (TIA), and cerebral infarction without residual deficits: Secondary | ICD-10-CM

## 2021-01-15 DIAGNOSIS — I10 Essential (primary) hypertension: Secondary | ICD-10-CM | POA: Diagnosis not present

## 2021-01-15 DIAGNOSIS — M19011 Primary osteoarthritis, right shoulder: Secondary | ICD-10-CM

## 2021-01-15 DIAGNOSIS — E785 Hyperlipidemia, unspecified: Secondary | ICD-10-CM

## 2021-01-15 DIAGNOSIS — G8111 Spastic hemiplegia affecting right dominant side: Secondary | ICD-10-CM

## 2021-01-15 MED ORDER — BACLOFEN 5 MG PO TABS: 5.0000 mg | ORAL_TABLET | Freq: Three times a day (TID) | ORAL | 5 refills | Status: DC | PRN

## 2021-01-15 NOTE — Progress Notes (Addendum)
[facility] [address] [place]. [place] [address] [phone]       STROKE FOLLOW UP NOTE  [name] Date of Birth:  January 29, 1956 Medical Record Number:  [id number]   Reason for Referral: Cryptogenic stroke follow up [place] provider: Dr. Leonie Man PCP: Katherina Mires, MD     CHIEF COMPLAINT:  Chief Complaint  Patient presents with   Follow-up    Rm 14 alone PT is well and stable, no new complications      HPI:  Today, 01/15/2021, Edward Glass returns for 41-month stroke follow-up unaccompanied.  He has been stable from stroke standpoint without new stroke/TIA symptoms and residual right sided spasticity and dysarthria.  Continues to use quad cane and denies any recent falls.  Use of baclofen 5 mg 3 times daily with benefit.  Compliant on Plavix without associated side effects.  He is concerned regarding possible statin myalgias as he will experience [name] pain with cramping shortly after taking atorvastatin.  Blood pressure today 137/81.  Loop recorder has not shown atrial fibrillation thus far.  Underwent right total shoulder arthroplasty 12/16/2020 by Dr. Marlou Sa without complication and recently started therapy.  No new concerns at this time.    History provided for reference purposes only Update 07/15/2020 JM: Edward Glass returns for 1-month stroke follow-up unaccompanied. 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 Has been using baclofen for spasticity 5mg  three times daily but does experience increased stiffness upon awakening and towards the end of the day. Feels as though speech has been improving. Denies new or worsening stroke/TIA symptoms. At prior visit, complained of right shoulder pain worsened post stroke. Evaluated by orthopedics with note personally reviewed and x-ray revealing severe end-stage OA with progression compared to prior imaging in 08/2018.  He has since received 2 injections with benefit.  Remains on Plavix and atorvastatin  without side effects for secondary stroke prevention. Blood pressure today 127/77. Loop recorder has not shown atrial fibrillation thus far.  Previously on Prozac for depression/anxiety post stroke but he has since been able to discontinue this without difficulty.  No further concerns at this time.  Update 01/10/2020 JM: Edward Glass returns for follow-up regarding stroke in 08/2018.  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  Denies new stroke/TIA symptoms.  Currently ambulating with a quad cane and denies any recent falls.  Reports typically caring cane at his side "just in case" but he is here to walk without it.  Initiated baclofen 5 mg twice daily as needed for spasticity.  Difficulty tolerating daytime dose due to increased fatigue has continued 5 mg dosage at night with only mild benefit.  He questions benefit of restarting Botox as he only received 2 rounds.  He does report right shoulder pain which is chronic but worsened post stroke.  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  Continues on clopidogrel and atorvastatin for secondary stroke prevention.  Prior lipid panel on 10/09/2019 by PCP showed LDL 51.  Blood pressure today 122/77.  Loop recorder is not shown atrial fibrillation thus far.  No further concerns at this time.  Update 09/06/2019 JM: Edward Glass is a 65 year old male who is being seen today for cryptogenic stroke follow-up.  He was previously seen via  virtual visit on 11/08/2018 and has not previously followed up as recommended.  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  He continues to ambulate with a Rollator walker and denies any recent falls.  Greatest concern today is  including continued right-sided spasticity.  Trial of Botox injections but denies benefit as well as trial of Zanaflex but patient self discontinued due to reported side effect of upset stomach.  Completed 3 months DAPT and continues on Plavix alone without bleeding or bruising.  Continues on atorvastatin without myalgias.  Blood pressure today 116/78.  Continues to follow with PCP regularly for HTN and HLD management/monitoring.  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  Loop recorder has not shown atrial fibrillation thus far.  Denies new or worsening stroke/TIA symptoms.  Initial visit via virtual 11/08/2018: He has been stable from a stroke standpoint with residual deficits of aphasia, cognitive deficits and right hemiparesis but does endorse improvement.  He continues to participate at neuro rehab PT/OT/ST along with continuing exercises at home.  He is currently ambulating with a quad cane and denies any recent falls.  He does need some assistance with bathing and dressing but continues to be able to do more on his own each day.  He no longer has any difficulties with swallowing and is currently on a regular diet.  He does endorse increased salivation.  He continues on aspirin and Plavix without side effects of bleeding or bruising.  Continues on atorvastatin without side effects myalgias.  Blood pressures not routinely monitored at home and encourage daughter and patient to obtain cough to start monitoring.  He does endorse occasional depression difficulties such as feeling down or increased anxiety.  It was recommended to initiate Prozac during hospital admission but states once he went home, he did not continue as he felt it was not needed.  Per review of loop recorder, no report of atrial fibrillation found but per patient and daughter, they were contacted by cardiology stating arrhythmia had been found and is in the process of scheduling visit with cardiology.  No  further concerns at this time.  Denies new or worsening stroke/TIA symptoms.  Stroke admission 08/30/2018: Edward Glass is a 65 year old male who presented to [facility] ED with right-sided weakness and aphasia.   CT head reviewed which showed age indeterminate small vessel ischemia in the left BG.  CTA showed possible left M1 occlusion versus severe stenosis.  CT perfusion no infarct for but large penumbra.  MRI  brain reviewed and showed left MCA patchy infarcts mostly concentrated in the left BG. He arrived greater than 24h from time since last well, therefore no acute stroke interventions done.  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  He unfortunately had neurological worsening with severe aphasia and dense right hemiplegia with extension of deficits on 09/01/2018.  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   When pts dtrs arrive, they both explain that in 2016 after tricep repair surgery they noted a possible arrhythmia and placed him on holter monitor for 30d, but was unrevealing.  2D echo showed an EF of 55 to 60%.  TCD bubble study negative for PFO.  Lower extremity venous Dopplers negative for DVT.  TEE showed small PFO/bubble crossover noted during Valsalva which was not felt to be clinically significant along with no evidence of cardiac thrombus therefore loop  recorder placed.   Recommended DAPT for 3 months then Plavix alone due to severe left M1 stenosis.  HTN stable recommended long-term BP goal 1 30-1 50 due left M1 stenosis.  HLD 104 and initiated atorvastatin 40 mg daily.  Other stroke risk factors include family history of stroke but no personal history of stroke.  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  Initiated Prozac on 08/31/2018.  He  had residual deficits of right hemiparesis, dysarthria and mixed aphasia and was discharged to [facility] for ongoing therapy.      ROS:   14 system review of systems performed and negative with exception of those listed in HPI  PMH:  Past Medical History:  Diagnosis Date   Arthritis    right shoulder   Atypical chest pain 08/10/2013   BPH (benign prostatic hyperplasia)    COVID-19 07/2020   CVA (cerebral vascular accident) ([place]) 08/2018   spastic hemiparesis of right dominant side   Dysphagia    Elevated aspartate aminotransferase level 06/16/2015   Excessive salivation 06/16/2015   H/O nutritional disorder 06/16/2015   H/O vitamin D deficiency    History of loop recorder 2020   Hx of adenomatous colonic polyps 2016   Hyperlipidemia    Hypertension    Hypogonadism male 08/21/2014   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   Screening for prostate cancer 08/21/2014   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    PSH:  Past Surgical History:  Procedure Laterality Date   COLONOSCOPY     HERNIA REPAIR  2006   left tricep surgery     LOOP RECORDER INSERTION N/A 09/04/2018   Procedure: LOOP RECORDER INSERTION;  Surgeon: Evans Lance, MD;  Location: [place] [facility];  Service: Cardiovascular;  Laterality: N/A;   TEE WITHOUT CARDIOVERSION N/A 09/04/2018   Procedure: TRANSESOPHAGEAL ECHOCARDIOGRAM (TEE);  Surgeon: Jerline Pain, MD;  Location: [facility] ENDOSCOPY;  Service: Cardiovascular;  Laterality: N/A;   TOTAL SHOULDER ARTHROPLASTY Right 12/16/2020   Procedure: RIGHT TOTAL SHOULDER ARTHROPLASTY;  Surgeon: Meredith Pel, MD;  Location: [place];  Service: Orthopedics;  Laterality: Right;    Social History:  Social History   Socioeconomic History   Marital status: Divorced    Spouse name: Not on file   Number of children: 2   Years of education: Not on file   Highest education level: Not on file  Occupational History   Occupation: Physiological scientist  Tobacco Use   Smoking status: Never   Smokeless  tobacco: Never  Vaping Use   Vaping Use: Never used  Substance and Sexual Activity   Alcohol use: No   Drug use: No   Sexual activity: Not on file  Other Topics Concern   Not on file  Social History Narrative   Not on file   Social Determinants of Health   Financial Resource Strain: Not on file  Food Insecurity: Not on file  Transportation Needs: Not on file  Physical Activity: Not on file  Stress: Not on file  Social Connections: Not on file  Intimate Partner Violence: Not on file    Family History:  Family History  Problem Relation Age of Onset   Kidney failure Mother    Cancer Mother    Prostate cancer Father    Cancer Father    Diabetes Father    Colon cancer Neg Hx    Esophageal cancer Neg Hx  Rectal cancer Neg Hx    Stomach cancer Neg Hx     Medications:   Current Outpatient Medications on File Prior to Visit  Medication Sig Dispense Refill   acetaminophen (TYLENOL) 325 MG tablet Take 2 tablets (650 mg total) by mouth every 4 (four) hours as needed for mild pain (or temp > 37.5 C (99.5 F)).     amLODipine (NORVASC) 5 MG tablet Take 5 mg by mouth daily.     atorvastatin (LIPITOR) 40 MG tablet Take 1 tablet (40 mg total) by mouth daily at 6 PM. 90 tablet 3   Baclofen 5 MG TABS Take 5 mg by mouth 4 (four) times daily as needed. (Patient taking differently: Take 5 mg by mouth 3 (three) times daily.) 360 tablet 3   clopidogrel (PLAVIX) 75 MG tablet Take 1 tablet (75 mg total) by mouth daily. 90 tablet 0   diclofenac sodium (VOLTAREN) 1 % GEL Apply 2 g topically 4 (four) times daily. (Patient taking differently: Apply 2 g topically daily.) 100 g 5   multivitamin (ONE-A-DAY MEN'S) TABS tablet Take 1 tablet by mouth daily.     oxyCODONE (OXY IR/ROXICODONE) 5 MG immediate release tablet Take 1 tablet (5 mg total) by mouth every 4 (four) hours as needed for moderate pain (pain score 4-6). 30 tablet 0   No current facility-administered medications on file prior to visit.     Allergies:  No Known Allergies   Physical Exam  Vitals:   01/15/21 1037  BP: 137/81  Pulse: 67  Weight: 166 lb (75.3 kg)  Height: 5\' 5"  (1.651 m)    Body mass index is 27.62 kg/m. No results found.  General: well developed, well nourished, pleasant middle-aged male, seated, in no evident distress Head: head normocephalic and atraumatic.   Neck: supple with no carotid or supraclavicular bruits Cardiovascular: regular rate and rhythm, no murmurs Musculoskeletal: right arm sling s/p recent shoulder arthroplasty Skin:  no rash/petichiae Vascular:  Normal pulses all extremities   Neurologic Exam Mental Status: Awake and fully alert. Mild dysarthria with hypophonia. Oriented to place and time. Recent and remote memory intact. Attention span, concentration and fund of knowledge appropriate. Mood and affect appropriate.  Cranial Nerves: Pupils equal, briskly reactive to light. Extraocular movements full without nystagmus. Visual fields full to confrontation. Hearing intact. Facial sensation intact. Face, tongue, palate moves normally and symmetrically.  Motor: Full strength in all tested extremities except increased tone RUE and [name] Sensory.: intact to touch , pinprick , position and vibratory sensation.  Coordination: Rapid alternating movements normal in all extremities. Finger-to-nose and heel-to-shin performed accurately on left side.  Unable to adequately assess right side due to increased tone Gait and Station: Arises from chair without difficulty. Stance is normal. Gait demonstrates  slow hemiplegic gait and stiffened right leg with use of cane. Reflexes: 2+ RUE and [name] and 1+ left side. Toes downgoing.        ASSESSMENT: Edward Glass is a 65 y.o. year old male here with left MCA patchy infarct secondary to left M1 occlusion with embolic pattern due to unclear etiology on 08/30/2018 and neurological worsening with extension of deficits on 09/01/2018.  Loop recorder placed  which has not shown atrial fibrillation thus far.  Vascular risk factors include intracranial stenosis, HLD and HTN.  Residual stroke deficits of right upper and lower spasticity and dysarthria with ongoing improvement    PLAN:  Cryptogenic left MCA infarct:  Residual RUE and [name] spasticity, gait impairment and  dysarthria.  Continue baclofen 5 mg 3 times daily.  Encouraged continued exercise at home Continue clopidogrel 75 mg daily  and atorvastatin for secondary stroke prevention.  Loop recorder negative for atrial fibrillation thus far -continuously monitored by cardiology Discussed secondary stroke prevention measures and importance of close PCP follow-up for aggressive stroke risk factor management HTN: BP goal<130/90. Stable on amlodipine per PCP HLD: LDL goal<70.  Prior LDL 64 (06/2020).  Possible myalgias on atorvastatin -recommend to hold for 4 doses to see if symptoms resolve.  If resolved, will recommend starting on Crestor 20 mg daily - will follow up in 5 days Right shoulder OA: s/p arthroplasty 12/16/2020 by Dr. Marlou Sa without complication   Follow up in 6 months or call earlier if needed   CC: Antony Contras, MD Katherina Mires, MD   Frann Rider, [facility]  [facility] [address] [place], [place] [address]  Phone [phone] Fax [phone] Note: This document was prepared with digital dictation and possible smart phrase technology. Any transcriptional errors that result from this process are unintentional.    6

## 2021-01-15 NOTE — Addendum Note (Signed)
Addended by: Mal Misty on: 01/15/2021 12:13 PM   Modules accepted: Orders

## 2021-01-19 ENCOUNTER — Telehealth: Payer: Self-pay | Admitting: Adult Health

## 2021-01-19 NOTE — Addendum Note (Signed)
Addended by: Brandon Melnick on: 01/19/2021 05:27 PM   Modules accepted: Orders

## 2021-01-19 NOTE — Progress Notes (Signed)
[facility] Summary Report / Loop Recorder 

## 2021-01-19 NOTE — Telephone Encounter (Signed)
I spoke to pt and relayed that I do see the note of if myalgias resolve then to try crestor.  I relayed this would be done by your pcp after JM/NP initally starts you on this. Will send prescription to JM/NP for approval.

## 2021-01-19 NOTE — Telephone Encounter (Signed)
Pt states he is waiting for Janett Billow, NP to call in the medication to replace his atorvastatin (LIPITOR) 40 MG tablet, please call

## 2021-01-20 MED ORDER — ROSUVASTATIN CALCIUM 20 MG PO TABS: 20.0000 mg | ORAL_TABLET | Freq: Every day | ORAL | 3 refills | Status: DC

## 2021-01-20 NOTE — Addendum Note (Signed)
Addended by: Mal Misty on: 01/20/2021 12:10 PM   Modules accepted: Orders

## 2021-01-20 NOTE — Telephone Encounter (Addendum)
Noted.  Spoke to pt and let him know.  Take rosuvastatin 20mg  po daily. (Gave enough for a year). He verbalized understanding.

## 2021-01-20 NOTE — Telephone Encounter (Signed)
The information regarding this was under assessment/plan in my prior office visit note..  Order completed to start Crestor 20 mg daily.  Thank you.

## 2021-01-23 ENCOUNTER — Ambulatory Visit (INDEPENDENT_AMBULATORY_CARE_PROVIDER_SITE_OTHER): Payer: Medicare Other | Admitting: Physical Therapy

## 2021-01-23 ENCOUNTER — Other Ambulatory Visit: Payer: Self-pay

## 2021-01-23 DIAGNOSIS — M25511 Pain in right shoulder: Secondary | ICD-10-CM

## 2021-01-23 DIAGNOSIS — G8929 Other chronic pain: Secondary | ICD-10-CM

## 2021-01-23 DIAGNOSIS — M6281 Muscle weakness (generalized): Secondary | ICD-10-CM

## 2021-01-23 DIAGNOSIS — R600 Localized edema: Secondary | ICD-10-CM

## 2021-01-23 DIAGNOSIS — R293 Abnormal posture: Secondary | ICD-10-CM

## 2021-01-23 NOTE — Therapy (Signed)
[facility] [address] [place], [place], [address] Phone: [phone]   Fax:  [phone]  Physical Therapy Treatment  Patient Details  Name: Edward Glass MRN: [id number] Date of Birth: 1955-10-07 Referring Provider (PT): Dr. Marlou Sa   Encounter Date: 01/23/2021   PT End of Session - 01/23/21 0848     Visit Number 3    Number of Visits 16    Date for PT Re-Evaluation 03/04/21    Authorization Type BCBS    Progress Note Due on Visit 10    PT Start Time 0845    PT Stop Time 0930    PT Time Calculation (min) 45 min    Activity Tolerance Patient tolerated treatment well    Behavior During Therapy [facility] for tasks assessed/performed             Past Medical History:  Diagnosis Date   Arthritis    right shoulder   Atypical chest pain 08/10/2013   BPH (benign prostatic hyperplasia)    COVID-19 07/2020   CVA (cerebral vascular accident) ([place]) 08/2018   spastic hemiparesis of right dominant side   Dysphagia    Elevated aspartate aminotransferase level 06/16/2015   Excessive salivation 06/16/2015   H/O nutritional disorder 06/16/2015   H/O vitamin D deficiency    History of loop recorder 2020   Hx of adenomatous colonic polyps 2016   Hyperlipidemia    Hypertension    Hypogonadism male 08/21/2014   Leg varices 02/07/2012   Screening for prostate cancer 08/21/2014   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    Past Surgical History:  Procedure Laterality Date   COLONOSCOPY     HERNIA REPAIR  2006   left tricep surgery     LOOP RECORDER INSERTION N/A 09/04/2018   Procedure: LOOP RECORDER INSERTION;  Surgeon: Evans Lance, MD;  Location: [place] CV [facility];  Service: Cardiovascular;  Laterality: N/A;   TEE WITHOUT CARDIOVERSION N/A 09/04/2018   Procedure: TRANSESOPHAGEAL ECHOCARDIOGRAM (TEE);  Surgeon: Jerline Pain, MD;  Location: [facility] ENDOSCOPY;  Service: Cardiovascular;  Laterality: N/A;   TOTAL SHOULDER ARTHROPLASTY Right 12/16/2020    Procedure: RIGHT TOTAL SHOULDER ARTHROPLASTY;  Surgeon: Meredith Pel, MD;  Location: [place];  Service: Orthopedics;  Laterality: Right;    There were no vitals filed for this visit.   Subjective Assessment - 01/23/21 0848     Subjective Pt reports that his Rt shoulder only hurts (up to 5/10) when he moves it. No other changes since last visit.    Pertinent History CVA 2020    Currently in Pain? No/denies    Pain Score 0-No pain                [facility] PT Assessment - 01/23/21 0001       Assessment   Medical Diagnosis Rt TSA    Referring Provider (PT) Dr. Marlou Sa    Onset Date/Surgical Date 12/16/20    Hand Dominance Right      PROM   Right Shoulder External Rotation 21 Degrees   supported scaption of ~30               [facility] Adult PT Treatment/Exercise - 01/23/21 0001       Shoulder Exercises: Seated   Other Seated Exercises scap retraction x 5 sec x 10.  shoulder rolls x 10 CW/CCW    Other Seated Exercises Rt forearm pronation/supination holding 1# in hand x 20      Shoulder Exercises: Pulleys   Flexion  2 minutes    ABduction 2 minutes      Shoulder Exercises: Stretch   Table Stretch - Flexion 10 seconds   cues to hold stretch to 10 sec, 8 reps.   Table Stretch - Abduction --   8 reps   Table Stretch - ABduction Limitations (scaption)      Vasopneumatic   Number Minutes Vasopneumatic  10 minutes    Vasopnuematic Location  Shoulder   Rt   Vasopneumatic Pressure Medium    Vasopneumatic Temperature  34      Manual Therapy   Manual therapy comments Rt shoulder PROM flexion, scaption, gentle ER - all to tissue limits and no pain.  Rt forearm pronation/ supination, elbow ext                      PT Short Term Goals - 01/07/21 1424       PT SHORT TERM GOAL #1   Title Patient will demonstrate independent use of home exercise program to maintain progress from in clinic treatments.    Time 3    Period Weeks    Status New    Target Date  01/28/21               PT Long Term Goals - 01/07/21 1424       PT LONG TERM GOAL #1   Title Patient will demonstrate/report pain at worst less than or equal to 2/10 to facilitate minimal limitation in daily activity secondary to pain symptoms.    Time 10    Period Weeks    Status New    Target Date 03/18/21      PT LONG TERM GOAL #2   Title Patient will demonstrate independent use of home exercise program to facilitate ability to maintain/progress functional gains from skilled physical therapy services.    Time 10    Period Weeks    Status New    Target Date 03/18/21      PT LONG TERM GOAL #3   Title Pt. will demonstrate FOTO outcome > or = 63 to indicate reduced disability due to condition.    Time 10    Period Weeks    Status New    Target Date 03/18/21      PT LONG TERM GOAL #4   Title Pt. will demonstrate Rt glenohumural AROM within 75% of Lt to faciltiate usual dressing, overhead reaching in daily activity at PLOF.    Time 10    Period Weeks    Status New    Target Date 03/18/21      PT LONG TERM GOAL #5   Title Pt. will demonstrate Rt shoulder MMT > or = 4/5, dynamometry with 15% of Lt to facilitate lifting, reaching activity of daily life at PLOF.    Time 10    Period Weeks    Status New    Target Date 03/18/21                   Plan - 01/23/21 0923     Clinical Impression Statement Pt is 5 wks s/p Rt TSA. Continued focus on Rt shoulder ROM, while maintaining precautions.  Encouraged pt to hold stretches for HEP longer than 1 sec to allow for great lengthening of tissue.  Progressing towards goals.    Personal Factors and Comorbidities Comorbidity 3+    Comorbidities CVA 2020 hemiparesis Rt side, hyperlipidemia, HTN,    Examination-Activity Limitations Bathing;Bed Mobility;Bend;Carry;Toileting;Transfers;Dressing;Hygiene/Grooming;Lift;Reach Overhead;Self Feeding  Examination-Participation Restrictions Occupation;Meal  Prep;Laundry;Driving;Community Activity;Cleaning;Shop    Stability/Clinical Decision Making Stable/Uncomplicated    Rehab Potential --   fair to good   PT Frequency 2x / week    PT Duration Other (comment)   10 weeks   PT Treatment/Interventions ADLs/Self Care Home Management;Cryotherapy;Electrical Stimulation;Iontophoresis 4mg /ml Dexamethasone;Moist Heat;Balance training;Therapeutic exercise;Manual techniques;Therapeutic activities;Functional mobility training;Stair training;Gait training;DME Instruction;Ultrasound;Neuromuscular re-education;Patient/family education;Passive range of motion;Dry needling;Joint Manipulations;Vasopneumatic Device;Taping    PT Next Visit Plan Rt TSA - limited forced ER 6 weeks, active IR 6 weeks from 12/31/2020 for subscap repair.  Continue Rt shoulder passive mobility gains, AAROM gain at this time in manual and ther ex,  vaso use.    PT Home Exercise Plan [id number], shoulder pulleys    Consulted and Agree with Plan of Care Patient             Patient will benefit from skilled therapeutic intervention in order to improve the following deficits and impairments:  Hypomobility, Pain, Impaired UE functional use, Increased fascial restricitons, Decreased strength, Decreased activity tolerance, Increased edema, Decreased mobility, Improper body mechanics, Impaired perceived functional ability, Impaired flexibility, Decreased range of motion, Decreased coordination, Decreased endurance, Postural dysfunction  Visit Diagnosis: Chronic right shoulder pain  Muscle weakness (generalized)  Abnormal posture  Localized edema     Problem List Patient Active Problem List   Diagnosis Date Noted   Arthritis of right shoulder region    S/P shoulder replacement, 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   Slow transit constipation    Benign prostatic hyperplasia with urinary retention    Spastic hemiparesis of right dominant side (HCC)    Chronic right shoulder pain    Primary osteoarthritis of  right shoulder    Left basal ganglia embolic stroke ([place]) 09/73/5329   CVA (cerebral vascular accident) ([place]) 08/31/2018   Elevated aspartate aminotransferase level 06/16/2015   H/O nutritional disorder 06/16/2015   Excessive salivation 06/16/2015   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

## 2021-01-23 NOTE — Progress Notes (Signed)
I agree with the above plan 

## 2021-01-27 ENCOUNTER — Ambulatory Visit (INDEPENDENT_AMBULATORY_CARE_PROVIDER_SITE_OTHER): Payer: Medicare Other

## 2021-01-27 ENCOUNTER — Other Ambulatory Visit: Payer: Self-pay

## 2021-01-27 ENCOUNTER — Encounter: Payer: Self-pay | Admitting: Physical Therapy

## 2021-01-27 ENCOUNTER — Ambulatory Visit (INDEPENDENT_AMBULATORY_CARE_PROVIDER_SITE_OTHER): Payer: Medicare Other | Admitting: Physical Therapy

## 2021-01-27 DIAGNOSIS — M25511 Pain in right shoulder: Secondary | ICD-10-CM | POA: Diagnosis not present

## 2021-01-27 DIAGNOSIS — M25611 Stiffness of right shoulder, not elsewhere classified: Secondary | ICD-10-CM

## 2021-01-27 DIAGNOSIS — M6281 Muscle weakness (generalized): Secondary | ICD-10-CM | POA: Diagnosis not present

## 2021-01-27 DIAGNOSIS — R600 Localized edema: Secondary | ICD-10-CM

## 2021-01-27 DIAGNOSIS — I63512 Cerebral infarction due to unspecified occlusion or stenosis of left middle cerebral artery: Secondary | ICD-10-CM | POA: Diagnosis not present

## 2021-01-27 DIAGNOSIS — G8929 Other chronic pain: Secondary | ICD-10-CM

## 2021-01-27 DIAGNOSIS — R293 Abnormal posture: Secondary | ICD-10-CM

## 2021-01-27 LAB — CUP PACEART REMOTE DEVICE CHECK
Date Time Interrogation Session: 20220702012209
Implantable Pulse Generator Implant Date: 20200210

## 2021-01-27 NOTE — Therapy (Signed)
[facility] [address] [place], [place], [address] Phone: [phone]   Fax:  [phone]  Physical Therapy Treatment  Patient Details  Name: Edward Glass MRN: [id number] Date of Birth: 10/01/1955 Referring Provider (PT): Dr. Marlou Sa   Encounter Date: 01/27/2021   PT End of Session - 01/27/21 1125     Visit Number 4    Number of Visits 16    Date for PT Re-Evaluation 03/04/21    Authorization Type BCBS    Progress Note Due on Visit 10    PT Start Time 1100    PT Stop Time 1145    PT Time Calculation (min) 45 min    Activity Tolerance Patient tolerated treatment well    Behavior During Therapy [facility] for tasks assessed/performed             Past Medical History:  Diagnosis Date   Arthritis    right shoulder   Atypical chest pain 08/10/2013   BPH (benign prostatic hyperplasia)    COVID-19 07/2020   CVA (cerebral vascular accident) ([place]) 08/2018   spastic hemiparesis of right dominant side   Dysphagia    Elevated aspartate aminotransferase level 06/16/2015   Excessive salivation 06/16/2015   H/O nutritional disorder 06/16/2015   H/O vitamin D deficiency    History of loop recorder 2020   Hx of adenomatous colonic polyps 2016   Hyperlipidemia    Hypertension    Hypogonadism male 08/21/2014   Leg varices 02/07/2012   Screening for prostate cancer 08/21/2014   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    Past Surgical History:  Procedure Laterality Date   COLONOSCOPY     HERNIA REPAIR  2006   left tricep surgery     LOOP RECORDER INSERTION N/A 09/04/2018   Procedure: LOOP RECORDER INSERTION;  Surgeon: Evans Lance, MD;  Location: [place] CV LAB;  Service: Cardiovascular;  Laterality: N/A;   TEE WITHOUT CARDIOVERSION N/A 09/04/2018   Procedure: TRANSESOPHAGEAL ECHOCARDIOGRAM (TEE);  Surgeon: Jerline Pain, MD;  Location: [facility] ENDOSCOPY;  Service: Cardiovascular;  Laterality: N/A;   TOTAL SHOULDER ARTHROPLASTY Right 12/16/2020    Procedure: RIGHT TOTAL SHOULDER ARTHROPLASTY;  Surgeon: Meredith Pel, MD;  Location: [place];  Service: Orthopedics;  Laterality: Right;    There were no vitals filed for this visit.   Subjective Assessment - 01/27/21 1101     Subjective Pt states the shoulder only hurts when he goes to move it. Pt states no issues with HEP.    Pertinent History CVA 2020    Currently in Pain? No/denies                               [facility] Adult PT Treatment/Exercise - 01/27/21 0001       Shoulder Exercises: Supine   Protraction Limitations AAROM at 90 15x 3s    Flexion AAROM;Right;20 reps    Shoulder Flexion Weight (lbs) 1lb bar    Other Supine Exercises AAROM cane ER, no more than 30 deg 10x 3s hold         Wrist PRE Flexion ext, sup, pro 2lb 20x         Shoulder Exercises: Pulleys   Flexion 2 minutes    ABduction 2 minutes      Shoulder Exercises: Stretch   Table Stretch - Flexion 10 seconds   cues to hold stretch to 10 sec, 8 reps.  Other Shoulder Stretches AAROM cane flexion 10x 5s    Other Shoulder Stretches cane ER stretch      Vasopneumatic   Number Minutes Vasopneumatic  10 minutes    Vasopnuematic Location  Shoulder   Rt   Vasopneumatic Pressure Medium    Vasopneumatic Temperature  34      Manual Therapy   Manual therapy comments Rt shoulder PROM flexion, scaption, ABD, - grade I-II LAD in scaption                    PT Education - 01/27/21 1124     Education Details shoulder precuations/protocol, DOMS expectations, muscle firing, HEP, joint protection, postural changes, POC    Person(s) Educated Patient    Methods Explanation;Demonstration;Tactile cues;Verbal cues    Comprehension Verbalized understanding;Returned demonstration;Verbal cues required;Tactile cues required              PT Short Term Goals - 01/07/21 1424       PT SHORT TERM GOAL #1   Title Patient will demonstrate independent use of home exercise  program to maintain progress from in clinic treatments.    Time 3    Period Weeks    Status New    Target Date 01/28/21               PT Long Term Goals - 01/07/21 1424       PT LONG TERM GOAL #1   Title Patient will demonstrate/report pain at worst less than or equal to 2/10 to facilitate minimal limitation in daily activity secondary to pain symptoms.    Time 10    Period Weeks    Status New    Target Date 03/18/21      PT LONG TERM GOAL #2   Title Patient will demonstrate independent use of home exercise program to facilitate ability to maintain/progress functional gains from skilled physical therapy services.    Time 10    Period Weeks    Status New    Target Date 03/18/21      PT LONG TERM GOAL #3   Title Pt. will demonstrate FOTO outcome > or = 63 to indicate reduced disability due to condition.    Time 10    Period Weeks    Status New    Target Date 03/18/21      PT LONG TERM GOAL #4   Title Pt. will demonstrate Rt glenohumural AROM within 75% of Lt to faciltiate usual dressing, overhead reaching in daily activity at PLOF.    Time 10    Period Weeks    Status New    Target Date 03/18/21      PT LONG TERM GOAL #5   Title Pt. will demonstrate Rt shoulder MMT > or = 4/5, dynamometry with 15% of Lt to facilitate lifting, reaching activity of daily life at PLOF.    Time 10    Period Weeks    Status New    Target Date 03/18/21                   Plan - 01/27/21 1141     Clinical Impression Statement Pt progression well with ROM within given precautions. Pt does demonstrate signficant guarding with PROM and stiffness into flexion at today's session. Pt able to reach ~95 deg of flexionand scaption with AAROM. Plan to continue with R shoulder ROM as tolerated. May need to review wrist PRE at next session. Pt would benefit from continued skilled therapy  in order to reach goals and maximize functional R UE strength and ROM for full return to PLOF.     Personal Factors and Comorbidities Comorbidity 3+    Comorbidities CVA 2020 hemiparesis Rt side, hyperlipidemia, HTN,    Examination-Activity Limitations Bathing;Bed Mobility;Bend;Carry;Toileting;Transfers;Dressing;Hygiene/Grooming;Lift;Reach Overhead;Self Feeding    Examination-Participation Restrictions Occupation;Meal Prep;Laundry;Driving;Community Activity;Cleaning;Shop    Stability/Clinical Decision Making Stable/Uncomplicated    Rehab Potential --   fair to good   PT Frequency 2x / week    PT Duration Other (comment)   10 weeks   PT Treatment/Interventions ADLs/Self Care Home Management;Cryotherapy;Electrical Stimulation;Iontophoresis 4mg /ml Dexamethasone;Moist Heat;Balance training;Therapeutic exercise;Manual techniques;Therapeutic activities;Functional mobility training;Stair training;Gait training;DME Instruction;Ultrasound;Neuromuscular re-education;Patient/family education;Passive range of motion;Dry needling;Joint Manipulations;Vasopneumatic Device;Taping    PT Next Visit Plan Rt TSA - limited forced ER 6 weeks, active IR 6 weeks from 12/31/2020 for subscap repair.  Continue Rt shoulder passive mobility gains, AAROM gain at this time in manual and ther ex,  vaso use.    PT Home Exercise Plan [id number], shoulder pulleys    Consulted and Agree with Plan of Care Patient             Patient will benefit from skilled therapeutic intervention in order to improve the following deficits and impairments:  Hypomobility, Pain, Impaired UE functional use, Increased fascial restricitons, Decreased strength, Decreased activity tolerance, Increased edema, Decreased mobility, Improper body mechanics, Impaired perceived functional ability, Impaired flexibility, Decreased range of motion, Decreased coordination, Decreased endurance, Postural dysfunction  Visit Diagnosis: Chronic right shoulder pain  Muscle weakness (generalized)  Abnormal posture  Localized edema  Stiffness of right shoulder,  not elsewhere classified     Problem List Patient Active Problem List   Diagnosis Date Noted   Arthritis of right shoulder region    S/P shoulder replacement, 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   Slow transit constipation    Benign prostatic hyperplasia with urinary retention    Spastic hemiparesis of right dominant side (HCC)    Chronic right shoulder pain    Primary osteoarthritis of right shoulder    Left basal ganglia embolic stroke ([place]) 74/94/4967   CVA (cerebral vascular accident) ([place]) 08/31/2018   Elevated aspartate aminotransferase level 06/16/2015   H/O nutritional disorder 06/16/2015   Excessive salivation 06/16/2015   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

## 2021-01-28 ENCOUNTER — Encounter: Payer: Medicare Other | Admitting: Orthopedic Surgery

## 2021-02-03 ENCOUNTER — Encounter: Payer: Self-pay | Admitting: Physical Therapy

## 2021-02-03 ENCOUNTER — Ambulatory Visit (INDEPENDENT_AMBULATORY_CARE_PROVIDER_SITE_OTHER): Payer: Medicare Other | Admitting: Physical Therapy

## 2021-02-03 ENCOUNTER — Ambulatory Visit (INDEPENDENT_AMBULATORY_CARE_PROVIDER_SITE_OTHER): Payer: Medicare Other | Admitting: Surgical

## 2021-02-03 ENCOUNTER — Encounter: Payer: Self-pay | Admitting: Surgical

## 2021-02-03 ENCOUNTER — Other Ambulatory Visit: Payer: Self-pay

## 2021-02-03 DIAGNOSIS — R293 Abnormal posture: Secondary | ICD-10-CM | POA: Diagnosis not present

## 2021-02-03 DIAGNOSIS — M25511 Pain in right shoulder: Secondary | ICD-10-CM

## 2021-02-03 DIAGNOSIS — G8929 Other chronic pain: Secondary | ICD-10-CM

## 2021-02-03 DIAGNOSIS — M6281 Muscle weakness (generalized): Secondary | ICD-10-CM

## 2021-02-03 DIAGNOSIS — R600 Localized edema: Secondary | ICD-10-CM | POA: Diagnosis not present

## 2021-02-03 DIAGNOSIS — Z96611 Presence of right artificial shoulder joint: Secondary | ICD-10-CM

## 2021-02-03 NOTE — Therapy (Signed)
[facility] [address] [place], [place], [address] Phone: [phone]   Fax:  [phone]  Physical Therapy Progress Note  Patient Details  Name: Edward Glass MRN: [id number] Date of Birth: 06/12/1956 Referring Provider (PT): Dr. Marlou Sa   Encounter Date: 02/03/2021   PT End of Session - 02/03/21 1222     Visit Number 5    Number of Visits 16    Date for PT Re-Evaluation 03/04/21    Authorization Type BCBS    Progress Note Due on Visit 10    PT Start Time 3212    PT Stop Time 2482    PT Time Calculation (min) 49 min    Activity Tolerance Patient tolerated treatment well    Behavior During Therapy [facility] for tasks assessed/performed             Past Medical History:  Diagnosis Date   Arthritis    right shoulder   Atypical chest pain 08/10/2013   BPH (benign prostatic hyperplasia)    COVID-19 07/2020   CVA (cerebral vascular accident) ([place]) 08/2018   spastic hemiparesis of right dominant side   Dysphagia    Elevated aspartate aminotransferase level 06/16/2015   Excessive salivation 06/16/2015   H/O nutritional disorder 06/16/2015   H/O vitamin D deficiency    History of loop recorder 2020   Hx of adenomatous colonic polyps 2016   Hyperlipidemia    Hypertension    Hypogonadism male 08/21/2014   Leg varices 02/07/2012   Screening for prostate cancer 08/21/2014   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    Past Surgical History:  Procedure Laterality Date   COLONOSCOPY     HERNIA REPAIR  2006   left tricep surgery     LOOP RECORDER INSERTION N/A 09/04/2018   Procedure: LOOP RECORDER INSERTION;  Surgeon: Evans Lance, MD;  Location: [place] [facility];  Service: Cardiovascular;  Laterality: N/A;   TEE WITHOUT CARDIOVERSION N/A 09/04/2018   Procedure: TRANSESOPHAGEAL ECHOCARDIOGRAM (TEE);  Surgeon: Jerline Pain, MD;  Location: [facility] ENDOSCOPY;  Service: Cardiovascular;  Laterality: N/A;   TOTAL SHOULDER ARTHROPLASTY Right 12/16/2020    Procedure: RIGHT TOTAL SHOULDER ARTHROPLASTY;  Surgeon: Meredith Pel, MD;  Location: [place];  Service: Orthopedics;  Laterality: Right;    There were no vitals filed for this visit.   Subjective Assessment - 02/03/21 1148     Subjective Pt states MD visit went well. He is cleared to "do whatever we want."    Pertinent History CVA 2020    Currently in Pain? No/denies    Pain Score 0-No pain                [facility] PT Assessment - 02/03/21 0001       AROM   Left Shoulder Flexion 85 Degrees    Left Shoulder ABduction 65 Degrees   significant UT compensation     PROM   Right Shoulder Flexion 96 Degrees    Right Shoulder Internal Rotation 50 Degrees    Right Shoulder External Rotation 30 Degrees      Strength   Overall Strength Comments 4-/5 throughout                           [facility] Adult PT Treatment/Exercise - 02/03/21 0001       Shoulder Exercises: Supine   Protraction Limitations 90 15x 3s    Flexion AAROM;Right;20 reps    Shoulder Flexion Weight (lbs) 2lb bar  Other Supine Exercises AAROM cane ER, 10x 3s hold      Shoulder Exercises: Seated   Other Seated Exercises --    Other Seated Exercises wrist PRE 2lbs      Shoulder Exercises: Standing   Theraband Level (Shoulder Row) Level 2 (Red)    Row Limitations 2x10      Shoulder Exercises: Pulleys   Flexion 2 minutes    ABduction 2 minutes      Shoulder Exercises: Stretch   Table Stretch - Flexion 10 seconds   cues to hold stretch to 10 sec, 8 reps.   Table Stretch - Abduction --   8 reps   Table Stretch - ABduction Limitations (scaption)    Table Stretch - External Rotation Limitations 10x 10s    Other Shoulder Stretches seated table ER stretc 10x 5s    Other Shoulder Stretches table bicep stretc 30s 3x      Vasopneumatic   Number Minutes Vasopneumatic  10 minutes    Vasopnuematic Location  Shoulder   Rt   Vasopneumatic Pressure Medium    Vasopneumatic Temperature  34       Manual Therapy   Manual therapy comments Rt shoulder PROM flexion, scaption, ABD, - grade III post and inf GHJ mob                     PT Short Term Goals - 02/03/21 1235       PT SHORT TERM GOAL #1   Title Patient will demonstrate independent use of home exercise program to maintain progress from in clinic treatments.    Time 3    Period Weeks    Status Achieved    Target Date 01/28/21               PT Long Term Goals - 02/03/21 1235       PT LONG TERM GOAL #1   Title Patient will demonstrate/report pain at worst less than or equal to 2/10 to facilitate minimal limitation in daily activity secondary to pain symptoms.    Time 10    Period Weeks    Status Achieved      PT LONG TERM GOAL #2   Title Patient will demonstrate independent use of home exercise program to facilitate ability to maintain/progress functional gains from skilled physical therapy services.    Time 10    Period Weeks    Status On-going      PT LONG TERM GOAL #3   Title Pt. will demonstrate FOTO outcome > or = 63 to indicate reduced disability due to condition.    Time 10    Period Weeks    Status Unable to assess      PT LONG TERM GOAL #4   Title Pt. will demonstrate Rt glenohumural AROM within 75% of Lt to faciltiate usual dressing, overhead reaching in daily activity at PLOF.    Time 10    Period Weeks    Status On-going      PT LONG TERM GOAL #5   Title Pt. will demonstrate Rt shoulder MMT > or = 4/5, dynamometry with 15% of Lt to facilitate lifting, reaching activity of daily life at PLOF.    Time 10    Period Weeks    Status On-going                   Plan - 02/03/21 1210     Clinical Impression Statement Pt able to progress to next  phase of R TS rehab protocol. Pt able to progress ROM exercise and begin gentle strengthening without increased pain. Pt is most limited by ROM and stiffness in R shoulder, though some may be from history of CVA and resting R biceps  muscle tone. Plan to continue with R shoulder ROM as tolerated. Pt HEP updated to progress strength and ROM. Pt would benefit from continued skilled therapy in order to reach goals and maximize functional R UE strength and ROM for full return to PLOF.    Personal Factors and Comorbidities Comorbidity 3+    Comorbidities CVA 2020 hemiparesis Rt side, hyperlipidemia, HTN,    Examination-Activity Limitations Bathing;Bed Mobility;Bend;Carry;Toileting;Transfers;Dressing;Hygiene/Grooming;Lift;Reach Overhead;Self Feeding    Examination-Participation Restrictions Occupation;Meal Prep;Laundry;Driving;Community Activity;Cleaning;Shop    Stability/Clinical Decision Making Stable/Uncomplicated    Rehab Potential --   fair to good   PT Frequency 2x / week    PT Duration Other (comment)   10 weeks   PT Treatment/Interventions ADLs/Self Care Home Management;Cryotherapy;Electrical Stimulation;Iontophoresis 4mg /ml Dexamethasone;Moist Heat;Balance training;Therapeutic exercise;Manual techniques;Therapeutic activities;Functional mobility training;Stair training;Gait training;DME Instruction;Ultrasound;Neuromuscular re-education;Patient/family education;Passive range of motion;Dry needling;Joint Manipulations;Vasopneumatic Device;Taping    PT Next Visit Plan Rt TSA - limited forced ER 6 weeks, active IR 6 weeks from 12/31/2020 for subscap repair.  Continue Rt shoulder passive mobility gains, AAROM gain at this time in manual and ther ex,  vaso use. FOTO   PT Home Exercise Plan [id number], shoulder pulleys    Consulted and Agree with Plan of Care Patient             Patient will benefit from skilled therapeutic intervention in order to improve the following deficits and impairments:  Hypomobility, Pain, Impaired UE functional use, Increased fascial restricitons, Decreased strength, Decreased activity tolerance, Increased edema, Decreased mobility, Improper body mechanics, Impaired perceived functional ability, Impaired  flexibility, Decreased range of motion, Decreased coordination, Decreased endurance, Postural dysfunction  Visit Diagnosis: Chronic right shoulder pain  Muscle weakness (generalized)  Abnormal posture  Localized edema     Problem List Patient Active Problem List   Diagnosis Date Noted   Arthritis of right shoulder region    S/P shoulder replacement, 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   Slow transit constipation    Benign prostatic hyperplasia with urinary retention    Spastic hemiparesis of right dominant side (HCC)    Chronic right shoulder pain    Primary osteoarthritis of right shoulder    Left basal ganglia embolic stroke ([place]) 99/83/3825   CVA (cerebral vascular accident) ([place]) 08/31/2018   Elevated aspartate aminotransferase level 06/16/2015   H/O nutritional disorder 06/16/2015   Excessive salivation 06/16/2015   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 [place], [place], [address] Phone: [phone]   Fax:  [phone]  Name: Edward Glass MRN: [id number] Date of Birth: 15-May-1956

## 2021-02-03 NOTE — Progress Notes (Signed)
Post-Op Visit Note   Patient: Edward Glass           Date of Birth: 12-07-1955           MRN: [id number] Visit Date: 02/03/2021 PCP: Katherina Mires, MD   Assessment & Plan:  Chief Complaint:  Chief Complaint  Patient presents with   Right Shoulder - Routine Post Op   Visit Diagnoses:  1. History of arthroplasty of right shoulder     Plan: Patient is a 65 year old male who presents s/p right shoulder total shoulder arthroplasty on 12/16/2020.  Patient is doing well overall and pain is well controlled.  He is still in his sling.  He is going to physical therapy where he is working primarily on passive range of motion.  Most of his pain that he does have is when he tries to move the right shoulder.  On exam he has well-healed incision with no evidence of infection.  Good subscapularis strength with no observable deformity of the right shoulder.  Axillary nerve is intact and deltoid is firing very well.  Sensation over the deltoid is intact.  2+ radial pulse of the operative extremity.  On exam he has 20 degrees external rotation, 50 degrees abduction, 70 degrees forward flexion.  Plan to continue with physical therapy and okay to progress patient to working on passive range of motion, active range of motion, right shoulder strengthening exercises.  Okay to progressively increase how much he lifts would start around 5 to 10 pounds.  Follow-up in 6 weeks for final check with Dr. Marlou Sa.  He understands that it would be more difficult to achieve optimal range of motion compared with most patients who undergo the surgery due to his history of severe osteoarthritis for a long time as well as the contracture from his history of stroke.  Follow-Up Instructions: No follow-ups on file.   Orders:  No orders of the defined types were placed in this encounter.  No orders of the defined types were placed in this encounter.   Imaging: No results found.  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   Slow transit constipation    Benign prostatic hyperplasia with urinary retention    Spastic hemiparesis of right dominant side (HCC)    Chronic right shoulder pain    Primary osteoarthritis of right shoulder    Left basal ganglia embolic stroke ([place]) 41/96/2229   CVA (cerebral vascular accident) ([place]) 08/31/2018   Elevated aspartate aminotransferase level 06/16/2015   H/O nutritional disorder 06/16/2015   Excessive salivation 06/16/2015   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   Heart palpitations 06/16/2015   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   Hypogonadism male 08/21/2014   Screening for prostate cancer 08/21/2014   Atypical chest pain 08/10/2013   Leg varices 02/07/2012   Past Medical History:  Diagnosis Date   Arthritis    right shoulder   Atypical chest pain 08/10/2013   BPH (benign prostatic hyperplasia)    COVID-19 07/2020   CVA (cerebral vascular accident) ([place]) 08/2018   spastic hemiparesis of right dominant side   Dysphagia    Elevated aspartate aminotransferase level 06/16/2015   Excessive salivation 06/16/2015   H/O nutritional disorder 06/16/2015   H/O vitamin D deficiency    History of loop recorder 2020   Hx of adenomatous colonic polyps 2016   Hyperlipidemia    Hypertension  Hypogonadism male 08/21/2014   Leg varices 02/07/2012   Screening for prostate cancer 08/21/2014   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    Family History  Problem Relation Age of Onset   Kidney failure Mother    Cancer Mother    Prostate cancer Father    Cancer Father    Diabetes Father    Colon cancer Neg Hx    Esophageal cancer Neg Hx    Rectal cancer Neg Hx    Stomach cancer Neg Hx     Past Surgical History:  Procedure Laterality Date   COLONOSCOPY     HERNIA REPAIR  2006   left tricep surgery     LOOP RECORDER INSERTION N/A 09/04/2018   Procedure: LOOP RECORDER  INSERTION;  Surgeon: Evans Lance, MD;  Location: [place] [facility];  Service: Cardiovascular;  Laterality: N/A;   TEE WITHOUT CARDIOVERSION N/A 09/04/2018   Procedure: TRANSESOPHAGEAL ECHOCARDIOGRAM (TEE);  Surgeon: Jerline Pain, MD;  Location: [facility] ENDOSCOPY;  Service: Cardiovascular;  Laterality: N/A;   TOTAL SHOULDER ARTHROPLASTY Right 12/16/2020   Procedure: RIGHT TOTAL SHOULDER ARTHROPLASTY;  Surgeon: Meredith Pel, MD;  Location: [place];  Service: Orthopedics;  Laterality: Right;   Social History   Occupational History   Occupation: Physiological scientist  Tobacco Use   Smoking status: Never   Smokeless tobacco: Never  Vaping Use   Vaping Use: Never used  Substance and Sexual Activity   Alcohol use: No   Drug use: No   Sexual activity: Not on file

## 2021-02-06 ENCOUNTER — Other Ambulatory Visit: Payer: Self-pay | Admitting: Adult Health

## 2021-02-09 ENCOUNTER — Other Ambulatory Visit: Payer: Self-pay | Admitting: Adult Health

## 2021-02-09 ENCOUNTER — Telehealth: Payer: Self-pay | Admitting: Adult Health

## 2021-02-09 MED ORDER — CLOPIDOGREL BISULFATE 75 MG PO TABS: 75.0000 mg | ORAL_TABLET | Freq: Every day | ORAL | 0 refills | Status: DC

## 2021-02-09 NOTE — Telephone Encounter (Signed)
I called [facility] and spoke with [facility] on this issue. She sts the insurance was showing this med claim was previously ran through another pharmacy hence the refill date of Sept. Edward Glass sts she will call [facility] and have them retract the med fill so they can proceed with refilling the medication.  Pt advised of this information and was advised [facility] should let him know once the medication is ready for pick up. Pt verbalized understanding.

## 2021-02-09 NOTE — Telephone Encounter (Signed)
Pt request refill clopidogrel (PLAVIX) 75 MG tablet at [id number]

## 2021-02-09 NOTE — Telephone Encounter (Signed)
Refill sent per pt's request.

## 2021-02-09 NOTE — Telephone Encounter (Signed)
Pt's girlfriend(on DPR) has called back to inform that pt received a message back from [facility] stating he is will not be able to get his clopidogrel (PLAVIX) 75 MG tablet  Until Sept.  Please call.

## 2021-02-10 ENCOUNTER — Encounter: Payer: Medicare Other | Admitting: Physical Therapy

## 2021-02-17 ENCOUNTER — Encounter: Payer: Self-pay | Admitting: Rehabilitative and Restorative Service Providers"

## 2021-02-17 ENCOUNTER — Other Ambulatory Visit: Payer: Self-pay

## 2021-02-17 ENCOUNTER — Ambulatory Visit (INDEPENDENT_AMBULATORY_CARE_PROVIDER_SITE_OTHER): Payer: Medicare Other | Admitting: Rehabilitative and Restorative Service Providers"

## 2021-02-17 DIAGNOSIS — M25511 Pain in right shoulder: Secondary | ICD-10-CM | POA: Diagnosis not present

## 2021-02-17 DIAGNOSIS — M25611 Stiffness of right shoulder, not elsewhere classified: Secondary | ICD-10-CM

## 2021-02-17 DIAGNOSIS — R600 Localized edema: Secondary | ICD-10-CM | POA: Diagnosis not present

## 2021-02-17 DIAGNOSIS — R293 Abnormal posture: Secondary | ICD-10-CM | POA: Diagnosis not present

## 2021-02-17 DIAGNOSIS — G8929 Other chronic pain: Secondary | ICD-10-CM

## 2021-02-17 DIAGNOSIS — M6281 Muscle weakness (generalized): Secondary | ICD-10-CM | POA: Diagnosis not present

## 2021-02-17 NOTE — Therapy (Signed)
[facility] [address] [place], [place], [address] Phone: [phone]   Fax:  [phone]  Physical Therapy Treatment  Patient Details  Name: Edward Glass MRN: [id number] Date of Birth: 02/20/1956 Referring Provider (PT): Dr. Marlou Sa   Encounter Date: 02/17/2021   PT End of Session - 02/17/21 1148     Visit Number 6    Number of Visits 16    Date for PT Re-Evaluation 03/04/21    Authorization Type BCBS    Progress Note Due on Visit 15    PT Start Time 1142    PT Stop Time 1221    PT Time Calculation (min) 39 min    Activity Tolerance Patient tolerated treatment well    Behavior During Therapy [facility] for tasks assessed/performed             Past Medical History:  Diagnosis Date   Arthritis    right shoulder   Atypical chest pain 08/10/2013   BPH (benign prostatic hyperplasia)    COVID-19 07/2020   CVA (cerebral vascular accident) ([place]) 08/2018   spastic hemiparesis of right dominant side   Dysphagia    Elevated aspartate aminotransferase level 06/16/2015   Excessive salivation 06/16/2015   H/O nutritional disorder 06/16/2015   H/O vitamin D deficiency    History of loop recorder 2020   Hx of adenomatous colonic polyps 2016   Hyperlipidemia    Hypertension    Hypogonadism male 08/21/2014   Leg varices 02/07/2012   Screening for prostate cancer 08/21/2014   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 ENDOSCOPY;  Service: Cardiovascular;  Laterality: N/A;   TOTAL SHOULDER ARTHROPLASTY Right 12/16/2020    Procedure: RIGHT TOTAL SHOULDER ARTHROPLASTY;  Surgeon: Meredith Pel, MD;  Location: [place];  Service: Orthopedics;  Laterality: Right;    There were no vitals filed for this visit.   Subjective Assessment - 02/17/21 1147     Subjective Pt. indicated feeling arm was stiff and some pain rated at 5/10 today upon arrival.    Pertinent History CVA 2020    Currently in Pain? Yes    Pain Score 5     Pain Location Shoulder    Pain Orientation Right    Pain Descriptors / Indicators Aching;Sore    Pain Type Chronic pain;Surgical pain    Pain Onset More than a month ago    Pain Frequency Intermittent    Aggravating Factors  stretching arm, lifting arm    Pain Relieving Factors rest                [facility] PT Assessment - 02/17/21 0001       Assessment   Medical Diagnosis Rt TSA    Referring Provider (PT) Dr. Marlou Sa    Onset Date/Surgical Date 12/16/20    Hand Dominance Right      PROM   Right Shoulder Flexion 100 Degrees    Right Shoulder ABduction 90 Degrees    Right Shoulder External Rotation 50 Degrees   in supine 45 deg abduction  [facility] Adult PT Treatment/Exercise - 02/17/21 0001       Shoulder Exercises: Supine   Flexion 20 reps;Both   2 lb bar     Shoulder Exercises: Pulleys   Flexion 3 minutes    ABduction 3 minutes    Other Pulley Exercises 2-3 second holds      Shoulder Exercises: ROM/Strengthening   UBE (Upper Arm Bike) UE only lvl 2.5 for ROM 3 mins fwd/reverse                      PT Short Term Goals - 02/03/21 1235       PT SHORT TERM GOAL #1   Title Patient will demonstrate independent use of home exercise program to maintain progress from in clinic treatments.    Time 3    Period Weeks    Status Achieved    Target Date 01/28/21               PT Long Term Goals - 02/03/21 1235       PT LONG TERM GOAL #1   Title Patient will demonstrate/report pain at worst less than or equal to  2/10 to facilitate minimal limitation in daily activity secondary to pain symptoms.    Time 10    Period Weeks    Status Achieved      PT LONG TERM GOAL #2   Title Patient will demonstrate independent use of home exercise program to facilitate ability to maintain/progress functional gains from skilled physical therapy services.    Time 10    Period Weeks    Status On-going      PT LONG TERM GOAL #3   Title Pt. will demonstrate FOTO outcome > or = 63 to indicate reduced disability due to condition.    Time 10    Period Weeks    Status Unable to assess      PT LONG TERM GOAL #4   Title Pt. will demonstrate Rt glenohumural AROM within 75% of Lt to faciltiate usual dressing, overhead reaching in daily activity at PLOF.    Time 10    Period Weeks    Status On-going      PT LONG TERM GOAL #5   Title Pt. will demonstrate Rt shoulder MMT > or = 4/5, dynamometry with 15% of Lt to facilitate lifting, reaching activity of daily life at PLOF.    Time 10    Period Weeks    Status On-going                   Plan - 02/17/21 1157     Clinical Impression Statement Continued capsular and myofascial restriction noted for all movements of Rt GH joint at this time c very firm end feel to movement c pain noted in movement.  Passive range is making slow and steady gains overall as noted today.    Personal Factors and Comorbidities Comorbidity 3+    Comorbidities CVA 2020 hemiparesis Rt side, hyperlipidemia, HTN,    Examination-Activity Limitations Bathing;Bed Mobility;Bend;Carry;Toileting;Transfers;Dressing;Hygiene/Grooming;Lift;Reach Overhead;Self Feeding    Examination-Participation Restrictions Occupation;Meal Prep;Laundry;Driving;Community Activity;Cleaning;Shop    Stability/Clinical Decision Making Stable/Uncomplicated    Rehab Potential --   fair to good   PT Frequency 2x / week    PT Duration Other (comment)   10 weeks   PT Treatment/Interventions ADLs/Self Care Home  Management;Cryotherapy;Electrical Stimulation;Iontophoresis '4mg'$ /ml Dexamethasone;Moist Heat;Balance training;Therapeutic exercise;Manual techniques;Therapeutic activities;Functional mobility training;Stair training;Gait training;DME Instruction;Ultrasound;Neuromuscular re-education;Patient/family education;Passive range of motion;Dry needling;Joint Manipulations;Vasopneumatic Device;Taping  PT Next Visit Plan Continue Rt shoulder passive mobility gains, AAROM gain at this time in manual and ther ex to tolerance    PT Home Exercise Plan [id number], shoulder pulleys    Consulted and Agree with Plan of Care Patient             Patient will benefit from skilled therapeutic intervention in order to improve the following deficits and impairments:  Hypomobility, Pain, Impaired UE functional use, Increased fascial restricitons, Decreased strength, Decreased activity tolerance, Increased edema, Decreased mobility, Improper body mechanics, Impaired perceived functional ability, Impaired flexibility, Decreased range of motion, Decreased coordination, Decreased endurance, Postural dysfunction  Visit Diagnosis: Chronic right shoulder pain  Muscle weakness (generalized)  Abnormal posture  Localized edema  Stiffness of right shoulder, not elsewhere classified     Problem List Patient Active Problem List   Diagnosis Date Noted   Arthritis of right shoulder region    S/P shoulder replacement, 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   Slow transit constipation    Benign prostatic hyperplasia with urinary retention    Spastic hemiparesis of right dominant side (HCC)    Chronic right shoulder pain    Primary osteoarthritis of right shoulder    Left basal ganglia embolic stroke ([place]) [id number]   CVA (cerebral vascular accident) ([place]) 08/31/2018   Elevated aspartate aminotransferase level 06/16/2015   H/O nutritional disorder 06/16/2015   Excessive salivation 06/16/2015   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 [place], [place], [address] Phone: [phone]   Fax:  [phone]  Name: JAYVIAN PESTA MRN: [id number] Date of Birth: 1956-03-15

## 2021-02-17 NOTE — Progress Notes (Signed)
[facility] Summary Report / Loop Recorder 

## 2021-02-24 ENCOUNTER — Encounter: Payer: Self-pay | Admitting: Physical Therapy

## 2021-02-24 ENCOUNTER — Other Ambulatory Visit: Payer: Self-pay

## 2021-02-24 ENCOUNTER — Ambulatory Visit (INDEPENDENT_AMBULATORY_CARE_PROVIDER_SITE_OTHER): Payer: Medicare Other | Admitting: Physical Therapy

## 2021-02-24 DIAGNOSIS — G8929 Other chronic pain: Secondary | ICD-10-CM

## 2021-02-24 DIAGNOSIS — M25511 Pain in right shoulder: Secondary | ICD-10-CM

## 2021-02-24 DIAGNOSIS — M6281 Muscle weakness (generalized): Secondary | ICD-10-CM | POA: Diagnosis not present

## 2021-02-24 DIAGNOSIS — M25611 Stiffness of right shoulder, not elsewhere classified: Secondary | ICD-10-CM

## 2021-02-24 NOTE — Therapy (Signed)
[facility] [address] [place], [place], [address] Phone: [phone]   Fax:  [phone]  Physical Therapy Treatment  Patient Details  Name: Edward Glass MRN: [id number] Date of Birth: 1955/09/05 Referring Provider (PT): Dr. Marlou Sa   Encounter Date: 02/24/2021   PT End of Session - 02/24/21 1150     Visit Number 7    Number of Visits 16    Date for PT Re-Evaluation 03/04/21    Authorization Type BCBS    Progress Note Due on Visit 15    PT Start Time [id number]    PT Stop Time 1240    PT Time Calculation (min) 55 min    Activity Tolerance Patient tolerated treatment well    Behavior During Therapy [facility] for tasks assessed/performed             Past Medical History:  Diagnosis Date   Arthritis    right shoulder   Atypical chest pain 08/10/2013   BPH (benign prostatic hyperplasia)    COVID-19 07/2020   CVA (cerebral vascular accident) ([place]) 08/2018   spastic hemiparesis of right dominant side   Dysphagia    Elevated aspartate aminotransferase level 06/16/2015   Excessive salivation 06/16/2015   H/O nutritional disorder 06/16/2015   H/O vitamin D deficiency    History of loop recorder 2020   Hx of adenomatous colonic polyps 2016   Hyperlipidemia    Hypertension    Hypogonadism male 08/21/2014   Leg varices 02/07/2012   Screening for prostate cancer 08/21/2014   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    Past Surgical History:  Procedure Laterality Date   COLONOSCOPY     HERNIA REPAIR  2006   left tricep surgery     LOOP RECORDER INSERTION N/A 09/04/2018   Procedure: LOOP RECORDER INSERTION;  Surgeon: Evans Lance, MD;  Location: [place] [facility];  Service: Cardiovascular;  Laterality: N/A;   TEE WITHOUT CARDIOVERSION N/A 09/04/2018   Procedure: TRANSESOPHAGEAL ECHOCARDIOGRAM (TEE);  Surgeon: Jerline Pain, MD;  Location: [facility];  Service: Cardiovascular;  Laterality: N/A;   TOTAL SHOULDER ARTHROPLASTY Right 12/16/2020    Procedure: RIGHT TOTAL SHOULDER ARTHROPLASTY;  Surgeon: Meredith Pel, MD;  Location: [place];  Service: Orthopedics;  Laterality: Right;    There were no vitals filed for this visit.   Subjective Assessment - 02/24/21 1149     Subjective Pt reports no new complaints. He states he is able to move it easier.    Pertinent History CVA 2020    Currently in Pain? Yes    Pain Score 3     Pain Location Shoulder    Pain Orientation Right    Pain Onset More than a month ago                [facility] PT Assessment - 02/24/21 0001       Observation/Other Assessments   Focus on Therapeutic Outcomes (FOTO)  36                           [facility] Adult PT Treatment/Exercise - 02/24/21 0001       Shoulder Exercises: Supine   Protraction Limitations --    Flexion AAROM;Right;20 reps    Shoulder Flexion Weight (lbs) 4lb DB both hands, 3s hold at top 20x   Other Supine Exercises AAROM cane ER, 10x 3s hold      Shoulder Exercises: Seated   Other Seated Exercises --  Shoulder Exercises: Standing   Theraband Level (Shoulder Row) Level 3 (Green)    Row Limitations 2x10    Other Standing Exercises standing cane extension 2lb bar  20x   Other Standing Exercises standing bilat ER 20x no band                       Shoulder Exercises: ROM/Strengthening   UBE (Upper Arm Bike) UE only lvl 2.5 for ROM 3 mins fwd/reverse      Shoulder Exercises: Stretch   Table Stretch - Flexion 10 seconds   cues to hold stretch to 10 sec, 8 reps.                         Vasopneumatic   Number Minutes Vasopneumatic  10 minutes    Vasopnuematic Location  Shoulder   Rt   Vasopneumatic Pressure Medium    Vasopneumatic Temperature  34      Manual Therapy   Manual therapy comments Rt shoulder PROM flexion, scaption, ABD, - grade I-II LAD in scaption                    PT Education - 02/24/21 1150     Education Details ROM deficits, DOMS expectations, muscle firing,  HEP, joint protection, postural changes, POC    Person(s) Educated Patient    Methods Explanation;Demonstration;Tactile cues;Verbal cues    Comprehension Verbalized understanding;Returned demonstration;Verbal cues required;Tactile cues required              PT Short Term Goals - 02/03/21 1235       PT SHORT TERM GOAL #1   Title Patient will demonstrate independent use of home exercise program to maintain progress from in clinic treatments.    Time 3    Period Weeks    Status Achieved    Target Date 01/28/21               PT Long Term Goals - 02/03/21 1235       PT LONG TERM GOAL #1   Title Patient will demonstrate/report pain at worst Edward than or equal to 2/10 to facilitate minimal limitation in daily activity secondary to pain symptoms.    Time 10    Period Weeks    Status Achieved      PT LONG TERM GOAL #2   Title Patient will demonstrate independent use of home exercise program to facilitate ability to maintain/progress functional gains from skilled physical therapy services.    Time 10    Period Weeks    Status On-going      PT LONG TERM GOAL #3   Title Pt. will demonstrate FOTO outcome > or = 63 to indicate reduced disability due to condition.    Time 10    Period Weeks    Status Unable to assess      PT LONG TERM GOAL #4   Title Pt. will demonstrate Rt glenohumural AROM within 75% of Lt to faciltiate usual dressing, overhead reaching in daily activity at PLOF.    Time 10    Period Weeks    Status On-going      PT LONG TERM GOAL #5   Title Pt. will demonstrate Rt shoulder MMT > or = 4/5, dynamometry with 15% of Lt to facilitate lifting, reaching activity of daily life at PLOF.    Time 10    Period Weeks    Status On-going  Plan - 02/24/21 1152     Clinical Impression Statement Pt able to continue with progression R shoulder ROM and strength. Pt had difficulty with resisted ER so changed to AROM ER. Pt required increased  cuing for scapular movement with ER and rowing. Pt had improved flexion and ABD at end of session with AAROM ~95 deg. Plan to continue with R shoulder ROM. Pt HEP updated to progress strength and ROM. Pt would benefit from continued skilled therapy in order to reach goals and maximize functional R UE strength and ROM for full return to PLOF.    Personal Factors and Comorbidities Comorbidity 3+    Comorbidities CVA 2020 hemiparesis Rt side, hyperlipidemia, HTN,    Examination-Activity Limitations Bathing;Bed Mobility;Bend;Carry;Toileting;Transfers;Dressing;Hygiene/Grooming;Lift;Reach Overhead;Self Feeding    Examination-Participation Restrictions Occupation;Meal Prep;Laundry;Driving;Community Activity;Cleaning;Shop    Stability/Clinical Decision Making Stable/Uncomplicated    Rehab Potential --   fair to good   PT Frequency 2x / week    PT Duration Other (comment)   10 weeks   PT Treatment/Interventions ADLs/Self Care Home Management;Cryotherapy;Electrical Stimulation;Iontophoresis '4mg'$ /ml Dexamethasone;Moist Heat;Balance training;Therapeutic exercise;Manual techniques;Therapeutic activities;Functional mobility training;Stair training;Gait training;DME Instruction;Ultrasound;Neuromuscular re-education;Patient/family education;Passive range of motion;Dry needling;Joint Manipulations;Vasopneumatic Device;Taping    PT Next Visit Plan Continue Rt shoulder passive mobility gains, AAROM gain at this time in manual and ther ex to tolerance    PT Home Exercise Plan [id number], shoulder pulleys    Consulted and Agree with Plan of Care Patient             Patient will benefit from skilled therapeutic intervention in order to improve the following deficits and impairments:  Hypomobility, Pain, Impaired UE functional use, Increased fascial restricitons, Decreased strength, Decreased activity tolerance, Increased edema, Decreased mobility, Improper body mechanics, Impaired perceived functional ability, Impaired  flexibility, Decreased range of motion, Decreased coordination, Decreased endurance, Postural dysfunction  Visit Diagnosis: Chronic right shoulder pain  Muscle weakness (generalized)  Stiffness of right shoulder, not elsewhere classified     Problem List Patient Active Problem List   Diagnosis Date Noted   Arthritis of right shoulder region    S/P shoulder replacement, 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   Slow transit constipation    Benign prostatic hyperplasia with urinary retention    Spastic hemiparesis of right dominant side (HCC)    Chronic right shoulder pain    Primary osteoarthritis of right shoulder    Left basal ganglia embolic stroke ([place]) 123XX123   CVA (cerebral vascular accident) ([place]) 08/31/2018   Elevated aspartate aminotransferase level 06/16/2015   H/O nutritional disorder 06/16/2015   Excessive salivation 06/16/2015   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 [place], [place], [address] Phone: [phone]   Fax:  [phone]  Name: Edward Glass MRN: [id number] Date of Birth: 05/05/1956

## 2021-02-26 LAB — CUP PACEART REMOTE DEVICE CHECK
Date Time Interrogation Session: 20220802012448
Implantable Pulse Generator Implant Date: 20200210

## 2021-03-02 ENCOUNTER — Ambulatory Visit (INDEPENDENT_AMBULATORY_CARE_PROVIDER_SITE_OTHER): Payer: Medicare Other

## 2021-03-02 DIAGNOSIS — I63512 Cerebral infarction due to unspecified occlusion or stenosis of left middle cerebral artery: Secondary | ICD-10-CM | POA: Diagnosis not present

## 2021-03-03 ENCOUNTER — Other Ambulatory Visit: Payer: Self-pay

## 2021-03-03 ENCOUNTER — Encounter: Payer: Self-pay | Admitting: Physical Therapy

## 2021-03-03 ENCOUNTER — Ambulatory Visit (INDEPENDENT_AMBULATORY_CARE_PROVIDER_SITE_OTHER): Payer: Medicare Other | Admitting: Physical Therapy

## 2021-03-03 DIAGNOSIS — M6281 Muscle weakness (generalized): Secondary | ICD-10-CM | POA: Diagnosis not present

## 2021-03-03 DIAGNOSIS — M25511 Pain in right shoulder: Secondary | ICD-10-CM | POA: Diagnosis not present

## 2021-03-03 DIAGNOSIS — G8929 Other chronic pain: Secondary | ICD-10-CM

## 2021-03-03 DIAGNOSIS — M25611 Stiffness of right shoulder, not elsewhere classified: Secondary | ICD-10-CM

## 2021-03-03 NOTE — Therapy (Signed)
[facility] [address] [place], [place], [address] Phone: [phone]   Fax:  [phone]  Physical Therapy Treatment  Patient Details  Name: Edward Glass MRN: [id number] Date of Birth: 1955-07-31 Referring Provider (PT): Dr. Marlou Sa   Encounter Date: 03/03/2021   PT End of Session - 03/03/21 1220     Visit Number 8    Number of Visits 16    Date for PT Re-Evaluation 03/04/21    Authorization Type BCBS    Progress Note Due on Visit 15    PT Start Time 1150    PT Stop Time 1230    PT Time Calculation (min) 40 min    Activity Tolerance Patient tolerated treatment well    Behavior During Therapy [name] for tasks assessed/performed             Past Medical History:  Diagnosis Date   Arthritis    right shoulder   Atypical chest pain 08/10/2013   BPH (benign prostatic hyperplasia)    COVID-19 07/2020   CVA (cerebral vascular accident) ([place]) 08/2018   spastic hemiparesis of right dominant side   Dysphagia    Elevated aspartate aminotransferase level 06/16/2015   Excessive salivation 06/16/2015   H/O nutritional disorder 06/16/2015   H/O vitamin D deficiency    History of loop recorder 2020   Hx of adenomatous colonic polyps 2016   Hyperlipidemia    Hypertension    Hypogonadism male 08/21/2014   Leg varices 02/07/2012   Screening for prostate cancer 08/21/2014   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    Past Surgical History:  Procedure Laterality Date   COLONOSCOPY     HERNIA REPAIR  2006   left tricep surgery     LOOP RECORDER INSERTION N/A 09/04/2018   Procedure: LOOP RECORDER INSERTION;  Surgeon: Evans Lance, MD;  Location: [place] [facility];  Service: Cardiovascular;  Laterality: N/A;   TEE WITHOUT CARDIOVERSION N/A 09/04/2018   Procedure: TRANSESOPHAGEAL ECHOCARDIOGRAM (TEE);  Surgeon: Jerline Pain, MD;  Location: [facility];  Service: Cardiovascular;  Laterality: N/A;   TOTAL SHOULDER ARTHROPLASTY Right 12/16/2020    Procedure: RIGHT TOTAL SHOULDER ARTHROPLASTY;  Surgeon: Meredith Pel, MD;  Location: [place];  Service: Orthopedics;  Laterality: Right;    There were no vitals filed for this visit.   Subjective Assessment - 03/03/21 1153     Subjective Pt statest the arm is moving a little better and he has had decreased pain. ABD is still painful.    Pertinent History CVA 2020    Currently in Pain? No/denies    Pain Score 0-No pain    Pain Onset More than a month ago                   [facility] Adult PT Treatment/Exercise - 03/03/21 0001       Shoulder Exercises: Supine   Flexion AAROM;Right;20 reps   2lb bar   Shoulder Flexion Weight (lbs) 4lb DB both hands, 3s hold at top    Other Supine Exercises AAROM cane ER, 10x 3s hold      Shoulder Exercises: Seated   Other Seated Exercises table ER and flexion 10s 10x      Shoulder Exercises: Standing           Other Standing Exercises TRX flexion stretch 10s 10x    Other Standing Exercises standing bilat ER 20x no band  Manual Therapy   Manual Therapy Soft tissue mobilization    Manual therapy comments Rt shoulder PROM flexion, ABD grade II-III inf and post mob    Soft tissue mobilization scar tissue mobilization, pec major/minor                      PT Short Term Goals - 02/03/21 1235       PT SHORT TERM GOAL #1   Title Patient will demonstrate independent use of home exercise program to maintain progress from in clinic treatments.    Time 3    Period Weeks    Status Achieved    Target Date 01/28/21               PT Long Term Goals - 02/03/21 1235       PT LONG TERM GOAL #1   Title Patient will demonstrate/report pain at worst less than or equal to 2/10 to facilitate minimal limitation in daily activity secondary to pain symptoms.    Time 10    Period Weeks    Status Achieved      PT LONG TERM GOAL #2   Title Patient will demonstrate independent  use of home exercise program to facilitate ability to maintain/progress functional gains from skilled physical therapy services.    Time 10    Period Weeks    Status On-going      PT LONG TERM GOAL #3   Title Pt. will demonstrate FOTO outcome > or = 63 to indicate reduced disability due to condition.    Time 10    Period Weeks    Status Unable to assess      PT LONG TERM GOAL #4   Title Pt. will demonstrate Rt glenohumural AROM within 75% of Lt to faciltiate usual dressing, overhead reaching in daily activity at PLOF.    Time 10    Period Weeks    Status On-going      PT LONG TERM GOAL #5   Title Pt. will demonstrate Rt shoulder MMT > or = 4/5, dynamometry with 15% of Lt to facilitate lifting, reaching activity of daily life at PLOF.    Time 10    Period Weeks    Status On-going                   Plan - 03/03/21 1226     Clinical Impression Statement Pt ROM limited today due to increased soft tissue stiffness and restriction. Pt had marked pec tightness and stiffness around scar/incision site. Pt has better improvement with CKC R shoulder stretching at today session with TRX flexion reaching 120 deg and table ER reaching ~55 at 60 deg ABD. Plan to continue with gaining as much ROM as possible. Pt would benefit from continued skilled therapy in order to reach goals and maximize functional R UE strength and ROM for full return to PLOF.    Personal Factors and Comorbidities Comorbidity 3+    Comorbidities CVA 2020 hemiparesis Rt side, hyperlipidemia, HTN,    Examination-Activity Limitations Bathing;Bed Mobility;Bend;Carry;Toileting;Transfers;Dressing;Hygiene/Grooming;Lift;Reach Overhead;Self Feeding    Examination-Participation Restrictions Occupation;Meal Prep;Laundry;Driving;Community Activity;Cleaning;Shop    Stability/Clinical Decision Making Stable/Uncomplicated    Rehab Potential --   fair to good   PT Frequency 2x / week    PT Duration Other (comment)   10 weeks   PT  Treatment/Interventions ADLs/Self Care Home Management;Cryotherapy;Electrical Stimulation;Iontophoresis '4mg'$ /ml Dexamethasone;Moist Heat;Balance training;Therapeutic exercise;Manual techniques;Therapeutic activities;Functional mobility training;Stair training;Gait training;DME Instruction;Ultrasound;Neuromuscular re-education;Patient/family education;Passive range of motion;Dry  needling;Joint Manipulations;Vasopneumatic Device;Taping    PT Next Visit Plan Continue Rt shoulder passive mobility gains, AAROM gain at this time in manual and ther ex to tolerance    PT Home Exercise Plan [id number], shoulder pulleys    Consulted and Agree with Plan of Care Patient             Patient will benefit from skilled therapeutic intervention in order to improve the following deficits and impairments:  Hypomobility, Pain, Impaired UE functional use, Increased fascial restricitons, Decreased strength, Decreased activity tolerance, Increased edema, Decreased mobility, Improper body mechanics, Impaired perceived functional ability, Impaired flexibility, Decreased range of motion, Decreased coordination, Decreased endurance, Postural dysfunction  Visit Diagnosis: Chronic right shoulder pain  Muscle weakness (generalized)  Stiffness of right shoulder, not elsewhere classified     Problem List Patient Active Problem List   Diagnosis Date Noted   Arthritis of right shoulder region    S/P shoulder replacement, 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   Slow transit constipation    Benign prostatic hyperplasia with urinary retention    Spastic hemiparesis of right dominant side (HCC)    Chronic right shoulder pain    Primary osteoarthritis of right shoulder    Left basal ganglia embolic stroke ([place]) 123XX123   CVA (cerebral vascular accident) ([place]) 08/31/2018   Elevated aspartate aminotransferase level 06/16/2015   H/O nutritional disorder 06/16/2015   Excessive salivation 06/16/2015   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

## 2021-03-10 ENCOUNTER — Ambulatory Visit (INDEPENDENT_AMBULATORY_CARE_PROVIDER_SITE_OTHER): Payer: Medicare Other | Admitting: Physical Therapy

## 2021-03-10 ENCOUNTER — Other Ambulatory Visit: Payer: Self-pay

## 2021-03-10 ENCOUNTER — Encounter: Payer: Self-pay | Admitting: Physical Therapy

## 2021-03-10 DIAGNOSIS — G8929 Other chronic pain: Secondary | ICD-10-CM

## 2021-03-10 DIAGNOSIS — M25611 Stiffness of right shoulder, not elsewhere classified: Secondary | ICD-10-CM | POA: Diagnosis not present

## 2021-03-10 DIAGNOSIS — M25511 Pain in right shoulder: Secondary | ICD-10-CM | POA: Diagnosis not present

## 2021-03-10 DIAGNOSIS — M6281 Muscle weakness (generalized): Secondary | ICD-10-CM

## 2021-03-10 NOTE — Therapy (Signed)
[facility] [address] [place], [place], [address] Phone: [phone]   Fax:  [phone]  Physical Therapy Treatment  Patient Details  Name: Edward Glass MRN: [id number] Date of Birth: [birth year] Referring Provider (PT): Dr. Marlou Sa   Encounter Date: 03/10/2021   PT End of Session - 03/10/21 1202     Visit Number 9    Number of Visits 16    Date for PT Re-Evaluation 03/04/21    Authorization Type BCBS    Progress Note Due on Visit 15    PT Start Time 1147    PT Stop Time 1228    PT Time Calculation (min) 41 min    Activity Tolerance Patient tolerated treatment well    Behavior During Therapy [facility] for tasks assessed/performed             Past Medical History:  Diagnosis Date   Arthritis    right shoulder   Atypical chest pain 08/10/2013   BPH (benign prostatic hyperplasia)    COVID-19 07/2020   CVA (cerebral vascular accident) ([place]) 08/2018   spastic hemiparesis of right dominant side   Dysphagia    Elevated aspartate aminotransferase level 06/16/2015   Excessive salivation 06/16/2015   H/O nutritional disorder 06/16/2015   H/O vitamin D deficiency    History of loop recorder 2020   Hx of adenomatous colonic polyps 2016   Hyperlipidemia    Hypertension    Hypogonadism male 08/21/2014   Leg varices 02/07/2012   Screening for prostate cancer 08/21/2014   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    Past Surgical History:  Procedure Laterality Date   COLONOSCOPY     HERNIA REPAIR  2006   left tricep surgery     LOOP RECORDER INSERTION N/A 09/04/2018   Procedure: LOOP RECORDER INSERTION;  Surgeon: Evans Lance, MD;  Location: [facility];  Service: Cardiovascular;  Laterality: N/A;   TEE WITHOUT CARDIOVERSION N/A 09/04/2018   Procedure: TRANSESOPHAGEAL ECHOCARDIOGRAM (TEE);  Surgeon: Jerline Pain, MD;  Location: [facility] ENDOSCOPY;  Service: Cardiovascular;  Laterality: N/A;   TOTAL SHOULDER ARTHROPLASTY Right 12/16/2020    Procedure: RIGHT TOTAL SHOULDER ARTHROPLASTY;  Surgeon: Meredith Pel, MD;  Location: [facility];  Service: Orthopedics;  Laterality: Right;    There were no vitals filed for this visit.   Subjective Assessment - 03/10/21 1148     Subjective Pt states the shoulder is a little stiffer today but it is stil doing well.    Pertinent History CVA 2020    Currently in Pain? Yes    Pain Score 4     Pain Location Shoulder    Pain Orientation Right    Pain Descriptors / Indicators Aching;Sore    Pain Onset More than a month ago                 [facility] Adult PT Treatment/Exercise - 03/10/21 0001       Shoulder Exercises: Supine   Flexion AAROM;Right;20 reps   2lb bar             Shoulder Exercises: Seated   Other Seated Exercises table ER and flexion 10s 10x      Shoulder Exercises: Standing   Other Standing Exercises TRX flexion stretch 10s 10x;    Other Standing Exercises GTB shoulder extension 2x10      Shoulder Exercises: ROM/Strengthening   UBE (Upper Arm Bike) UE only lvl 2.5 for ROM 3.5 mins fwd/reverse  Manual Therapy   Manual Therapy Soft tissue mobilization    Manual therapy comments Rt shoulder PROM flexion, ABD grade III inf    Soft tissue mobilization scar tissue mobilization               PT Short Term Goals - 02/03/21 1235       PT SHORT TERM GOAL #1   Title Patient will demonstrate independent use of home exercise program to maintain progress from in clinic treatments.    Time 3    Period Weeks    Status Achieved    Target Date 01/28/21               PT Long Term Goals - 02/03/21 1235       PT LONG TERM GOAL #1   Title Patient will demonstrate/report pain at worst less than or equal to 2/10 to facilitate minimal limitation in daily activity secondary to pain symptoms.    Time 10    Period Weeks    Status Achieved      PT LONG TERM GOAL #2   Title Patient will demonstrate independent use of home exercise program to  facilitate ability to maintain/progress functional gains from skilled physical therapy services.    Time 10    Period Weeks    Status On-going      PT LONG TERM GOAL #3   Title Pt. will demonstrate FOTO outcome > or = 63 to indicate reduced disability due to condition.    Time 10    Period Weeks    Status Unable to assess      PT LONG TERM GOAL #4   Title Pt. will demonstrate Rt glenohumural AROM within 75% of Lt to faciltiate usual dressing, overhead reaching in daily activity at PLOF.    Time 10    Period Weeks    Status On-going      PT LONG TERM GOAL #5   Title Pt. will demonstrate Rt shoulder MMT > or = 4/5, dynamometry with 15% of Lt to facilitate lifting, reaching activity of daily life at PLOF.    Time 10    Period Weeks    Status On-going                   Plan - 03/10/21 1203     Clinical Impression Statement Pt presented at today's session with signficantly increased R GHJ stiffness that may be related to static sleeping position. Pt had better response to AAROM that manual stretching and mobilizatin at today's session, potentially due to history of R UE spasticity. Pt able to reach 90 deg flexion by end of session with report of decreased stiffness. If stiffness continues to occur at next session, pt may require further referral due to history of stroke related spasticity on R UE. Pt would benefit from continued skilled therapy in order to reach goals and maximize functional R UE strength and ROM for full return to PLOF.    Personal Factors and Comorbidities Comorbidity 3+    Comorbidities CVA 2020 hemiparesis Rt side, hyperlipidemia, HTN,    Examination-Activity Limitations Bathing;Bed Mobility;Bend;Carry;Toileting;Transfers;Dressing;Hygiene/Grooming;Lift;Reach Overhead;Self Feeding    Examination-Participation Restrictions Occupation;Meal Prep;Laundry;Driving;Community Activity;Cleaning;Shop    Stability/Clinical Decision Making Stable/Uncomplicated    Rehab  Potential --   fair to good   PT Frequency 2x / week    PT Duration Other (comment)   10 weeks   PT Treatment/Interventions ADLs/Self Care Home Management;Cryotherapy;Electrical Stimulation;Iontophoresis '4mg'$ /ml Dexamethasone;Moist Heat;Balance training;Therapeutic exercise;Manual techniques;Therapeutic activities;Functional mobility  training;Stair training;Gait training;DME Instruction;Ultrasound;Neuromuscular re-education;Patient/family education;Passive range of motion;Dry needling;Joint Manipulations;Vasopneumatic Device;Taping    PT Next Visit Plan Continue Rt shoulder passive mobility gains, AAROM gain at this time in manual and ther ex to tolerance    PT Home Exercise Plan [id number], shoulder pulleys    Consulted and Agree with Plan of Care Patient             Patient will benefit from skilled therapeutic intervention in order to improve the following deficits and impairments:  Hypomobility, Pain, Impaired UE functional use, Increased fascial restricitons, Decreased strength, Decreased activity tolerance, Increased edema, Decreased mobility, Improper body mechanics, Impaired perceived functional ability, Impaired flexibility, Decreased range of motion, Decreased coordination, Decreased endurance, Postural dysfunction  Visit Diagnosis: Chronic right shoulder pain  Muscle weakness (generalized)  Stiffness of right shoulder, not elsewhere classified     Problem List Patient Active Problem List   Diagnosis Date Noted   Arthritis of right shoulder region    S/P shoulder replacement, 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   Slow transit constipation    Benign prostatic hyperplasia with urinary retention    Spastic hemiparesis of right dominant side (HCC)    Chronic right shoulder pain    Primary osteoarthritis of right shoulder    Left basal ganglia embolic stroke ([place]) 123XX123   CVA (cerebral vascular accident) ([place]) 08/31/2018   Elevated aspartate aminotransferase level 06/16/2015   H/O  nutritional disorder 06/16/2015   Excessive salivation 06/16/2015   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 [place], [place], [address] Phone: [phone]   Fax:  [phone]  Name: Edward Glass MRN: [id number] Date of Birth: [birth year]

## 2021-03-17 ENCOUNTER — Other Ambulatory Visit: Payer: Self-pay

## 2021-03-17 ENCOUNTER — Encounter: Payer: Self-pay | Admitting: Physical Therapy

## 2021-03-17 ENCOUNTER — Ambulatory Visit (INDEPENDENT_AMBULATORY_CARE_PROVIDER_SITE_OTHER): Payer: Medicare Other | Admitting: Physical Therapy

## 2021-03-17 DIAGNOSIS — M25611 Stiffness of right shoulder, not elsewhere classified: Secondary | ICD-10-CM

## 2021-03-17 DIAGNOSIS — G8929 Other chronic pain: Secondary | ICD-10-CM

## 2021-03-17 DIAGNOSIS — M6281 Muscle weakness (generalized): Secondary | ICD-10-CM

## 2021-03-17 DIAGNOSIS — M25511 Pain in right shoulder: Secondary | ICD-10-CM

## 2021-03-17 NOTE — Therapy (Addendum)
[facility] Physical Therapy [address] [place], [place], [address] Phone: [phone]   Fax:  [phone]  Physical Therapy Treatment /Discharge  Patient Details  Name: Edward Glass MRN: [id number] Date of Birth: 30-Aug-1955 Referring Provider (PT): Dr. Marlou Sa   Encounter Date: 03/17/2021   PT End of Session - 03/17/21 1150     Visit Number 10    Number of Visits 16    Date for PT Re-Evaluation 03/04/21    Authorization Type BCBS    Progress Note Due on Visit 15    PT Start Time 6237    PT Stop Time 1230    PT Time Calculation (min) 45 min    Activity Tolerance Patient tolerated treatment well    Behavior During Therapy [facility] for tasks assessed/performed             Past Medical History:  Diagnosis Date   Arthritis    right shoulder   Atypical chest pain 08/10/2013   BPH (benign prostatic hyperplasia)    COVID-19 07/2020   CVA (cerebral vascular accident) ([place]) 08/2018   spastic hemiparesis of right dominant side   Dysphagia    Elevated aspartate aminotransferase level 06/16/2015   Excessive salivation 06/16/2015   H/O nutritional disorder 06/16/2015   H/O vitamin D deficiency    History of loop recorder 2020   Hx of adenomatous colonic polyps 2016   Hyperlipidemia    Hypertension    Hypogonadism male 08/21/2014   Leg varices 02/07/2012   Screening for prostate cancer 08/21/2014   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    Past Surgical History:  Procedure Laterality Date   COLONOSCOPY     HERNIA REPAIR  2006   left tricep surgery     LOOP RECORDER INSERTION N/A 09/04/2018   Procedure: LOOP RECORDER INSERTION;  Surgeon: Evans Lance, MD;  Location: [facility];  Service: Cardiovascular;  Laterality: N/A;   TEE WITHOUT CARDIOVERSION N/A 09/04/2018   Procedure: TRANSESOPHAGEAL ECHOCARDIOGRAM (TEE);  Surgeon: Jerline Pain, MD;  Location: [facility] ENDOSCOPY;  Service: Cardiovascular;  Laterality: N/A;   TOTAL SHOULDER ARTHROPLASTY Right  12/16/2020   Procedure: RIGHT TOTAL SHOULDER ARTHROPLASTY;  Surgeon: Meredith Pel, MD;  Location: [facility];  Service: Orthopedics;  Laterality: Right;    There were no vitals filed for this visit.   Subjective Assessment - 03/17/21 1149     Subjective Pt states the shoulder is not as stiff today as it was last session. Pt states the stiffness has gotten better.    Pertinent History CVA 2020    Currently in Pain? No/denies    Pain Score 0-No pain    Pain Onset More than a month ago                [facility] PT Assessment - 03/17/21 0001       Assessment   Medical Diagnosis Rt TSA    Referring Provider (PT) Dr. Marlou Sa    Onset Date/Surgical Date 12/16/20    Hand Dominance Right      AROM   Right Shoulder Extension 35 Degrees    Right Shoulder Flexion 52 Degrees    Right Shoulder ABduction 68 Degrees    Right Shoulder Internal Rotation 55 Degrees    Right Shoulder External Rotation 35 Degrees      PROM   Right Shoulder Flexion 100 Degrees    Right Shoulder ABduction 83 Degrees    Right Shoulder Internal Rotation 83 Degrees    Right Shoulder External  Rotation 78 Degrees      Strength   Right Shoulder Flexion 4-/5    Right Shoulder ABduction 4/5    Right Shoulder Internal Rotation 4/5    Right Shoulder External Rotation 4/5                   [facility] Adult PT Treatment/Exercise - 03/17/21 0001       Shoulder Exercises: Supine   Flexion AAROM;Right;20 reps   2lb bar         Shoulder Exercises: Seated   Other Seated Exercises table ER and flexion 10s 10x      Shoulder Exercises: Standing   Other Standing Exercises TRX flexion stretch 10s 10x; TRK standing row 2x10; wall push up 2x10 (stopped due to pain)   Other Standing Exercises GTB shoulder extension and RTB standing IR with slow eccentric 2x10;      Shoulder Exercises: ROM/Strengthening   UBE (Upper Arm Bike) UE only lvl 2.5 for ROM 3 mins fwd/reverse      Manual Therapy   Manual Therapy Soft tissue  mobilization    Manual therapy comments Rt shoulder PROM flexion, ABD grade III inf                          PT Short Term Goals - 02/03/21 1235       PT SHORT TERM GOAL #1   Title Patient will demonstrate independent use of home exercise program to maintain progress from in clinic treatments.    Time 3    Period Weeks    Status Achieved    Target Date 01/28/21               PT Long Term Goals - 02/03/21 1235       PT LONG TERM GOAL #1   Title Patient will demonstrate/report pain at worst less than or equal to 2/10 to facilitate minimal limitation in daily activity secondary to pain symptoms.    Time 10    Period Weeks    Status Achieved      PT LONG TERM GOAL #2   Title Patient will demonstrate independent use of home exercise program to facilitate ability to maintain/progress functional gains from skilled physical therapy services.    Time 10    Period Weeks    Status On-going      PT LONG TERM GOAL #3   Title Pt. will demonstrate FOTO outcome > or = 63 to indicate reduced disability due to condition.    Time 10    Period Weeks    Status Unable to assess      PT LONG TERM GOAL #4   Title Pt. will demonstrate Rt glenohumural AROM within 75% of Lt to faciltiate usual dressing, overhead reaching in daily activity at PLOF.    Time 10    Period Weeks    Status On-going      PT LONG TERM GOAL #5   Title Pt. will demonstrate Rt shoulder MMT > or = 4/5, dynamometry with 15% of Lt to facilitate lifting, reaching activity of daily life at PLOF.    Time 10    Period Weeks    Status On-going                   Plan - 03/17/21 1152     Clinical Impression Statement Pt demonstrates minimal improvement with R shoulder ER and IR ROM but does have improve RTC strength  since last reassessment. Pt flexion and ABD have not improved since last assessment. Pt still with stiffness in R GHJ that appears related to previous history of stroke. Pt tension appear  spastic in nature. Pt pain is well controlled with minimal pain during exercise at this time. Pt to return to MD tomorrow and discuss further medical management in order improve ROM. Pt would benefit from continued skilled therapy in order to reach goals and maximize functional R UE strength and ROM for full return to PLOF.    Personal Factors and Comorbidities Comorbidity 3+    Comorbidities CVA 2020 hemiparesis Rt side, hyperlipidemia, HTN,    Examination-Activity Limitations Bathing;Bed Mobility;Bend;Carry;Toileting;Transfers;Dressing;Hygiene/Grooming;Lift;Reach Overhead;Self Feeding    Examination-Participation Restrictions Occupation;Meal Prep;Laundry;Driving;Community Activity;Cleaning;Shop    Stability/Clinical Decision Making Stable/Uncomplicated    Rehab Potential --   fair to good   PT Frequency 2x / week    PT Duration Other (comment)   10 weeks   PT Treatment/Interventions ADLs/Self Care Home Management;Cryotherapy;Electrical Stimulation;Iontophoresis 50m/ml Dexamethasone;Moist Heat;Balance training;Therapeutic exercise;Manual techniques;Therapeutic activities;Functional mobility training;Stair training;Gait training;DME Instruction;Ultrasound;Neuromuscular re-education;Patient/family education;Passive range of motion;Dry needling;Joint Manipulations;Vasopneumatic Device;Taping    PT Next Visit Plan Continue Rt shoulder passive mobility gains, AAROM gain at this time in manual and ther ex to tolerance    PT Home Exercise Plan [id number], shoulder pulleys    Consulted and Agree with Plan of Care Patient             Patient will benefit from skilled therapeutic intervention in order to improve the following deficits and impairments:  Hypomobility, Pain, Impaired UE functional use, Increased fascial restricitons, Decreased strength, Decreased activity tolerance, Increased edema, Decreased mobility, Improper body mechanics, Impaired perceived functional ability, Impaired flexibility,  Decreased range of motion, Decreased coordination, Decreased endurance, Postural dysfunction  Visit Diagnosis: Chronic right shoulder pain  Muscle weakness (generalized)  Stiffness of right shoulder, not elsewhere classified     Problem List Patient Active Problem List   Diagnosis Date Noted   Arthritis of right shoulder region    S/P shoulder replacement, 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   Slow transit constipation    Benign prostatic hyperplasia with urinary retention    Spastic hemiparesis of right dominant side (HCC)    Chronic right shoulder pain    Primary osteoarthritis of right shoulder    Left basal ganglia embolic stroke ([place] 025/83/4621  CVA (cerebral vascular accident) ([place] 08/31/2018   Elevated aspartate aminotransferase level 06/16/2015   H/O nutritional disorder 06/16/2015   Excessive salivation 06/16/2015   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 Patient goals were partially met. Patient is being discharged due to not returning since the last visit.  MScot Jun PT, DPT, OCS, ATC 05/05/21  3:04 PM      [facility] [address] [address]Phone: [phone]  Fax:  [phone] Name: HSTEADMAN PROSPERIMRN: [id number] of Birth: [birth year]

## 2021-03-18 ENCOUNTER — Encounter: Payer: Self-pay | Admitting: Orthopedic Surgery

## 2021-03-18 ENCOUNTER — Ambulatory Visit (INDEPENDENT_AMBULATORY_CARE_PROVIDER_SITE_OTHER): Payer: Medicare Other | Admitting: Orthopedic Surgery

## 2021-03-18 DIAGNOSIS — M19011 Primary osteoarthritis, right shoulder: Secondary | ICD-10-CM

## 2021-03-21 ENCOUNTER — Encounter: Payer: Self-pay | Admitting: Orthopedic Surgery

## 2021-03-21 NOTE — Progress Notes (Signed)
Post-Op Visit Note   Patient: Edward Glass           Date of Birth: November 16, 1955           MRN: [id number] Visit Date: 03/18/2021 PCP: Katherina Mires, MD   Assessment & Plan:  Chief Complaint:  Chief Complaint  Patient presents with   Right Shoulder - Follow-up   Visit Diagnoses:  1. Primary osteoarthritis, right shoulder     Plan: Edward Glass is a 65 year old patient is 3 months out right total shoulder replacement.  He has very hard bone and had a very stiff shoulder preoperatively.  He sleeps on his back.  Has increased pain with movement but his rest pain has improved.  He is in therapy 1 time a week.  Takes occasional Tylenol for symptoms.  Does have a home pulley.  On examination he has passive range of motion of 20/50/80.  Deltoid is functional.  Plan at this time is to continue to work on range of motion.  Shoulder is very stiff.  It is conceivable that sometime in the future he may require revision to reverse replacement if his stiffness does not improve.  We can cross that bridge when we come to it.  Follow-up in 3 months for clinical recheck on range of motion.  Follow-Up Instructions: Return in about 3 months (around 06/18/2021).   Orders:  No orders of the defined types were placed in this encounter.  No orders of the defined types were placed in this encounter.   Imaging: No results found.  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   Slow transit constipation    Benign prostatic hyperplasia with urinary retention    Spastic hemiparesis of right dominant side (HCC)    Chronic right shoulder pain    Primary osteoarthritis of right shoulder    Left basal ganglia embolic stroke ([place]) 123XX123   CVA (cerebral vascular accident) ([place]) 08/31/2018   Elevated aspartate aminotransferase level 06/16/2015   H/O nutritional disorder 06/16/2015   Excessive  salivation 06/16/2015   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   Heart palpitations 06/16/2015   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   Hypogonadism male 08/21/2014   Screening for prostate cancer 08/21/2014   Atypical chest pain 08/10/2013   Leg varices 02/07/2012   Past Medical History:  Diagnosis Date   Arthritis    right shoulder   Atypical chest pain 08/10/2013   BPH (benign prostatic hyperplasia)    COVID-19 07/2020   CVA (cerebral vascular accident) ([place]) 08/2018   spastic hemiparesis of right dominant side   Dysphagia    Elevated aspartate aminotransferase level 06/16/2015   Excessive salivation 06/16/2015   H/O nutritional disorder 06/16/2015   H/O vitamin D deficiency    History of loop recorder 2020   Hx of adenomatous colonic polyps 2016   Hyperlipidemia    Hypertension    Hypogonadism male 08/21/2014   Leg varices 02/07/2012   Screening for prostate cancer 08/21/2014   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    Family History  Problem Relation Age of Onset   Kidney failure Mother    Cancer Mother    Prostate cancer Father    Cancer Father    Diabetes Father    Colon cancer Neg Hx    Esophageal cancer Neg Hx    Rectal cancer Neg Hx    Stomach cancer Neg Hx  Past Surgical History:  Procedure Laterality Date   COLONOSCOPY     HERNIA REPAIR  2006   left tricep surgery     LOOP RECORDER INSERTION N/A 09/04/2018   Procedure: LOOP RECORDER INSERTION;  Surgeon: Evans Lance, MD;  Location: [facility];  Service: Cardiovascular;  Laterality: N/A;   TEE WITHOUT CARDIOVERSION N/A 09/04/2018   Procedure: TRANSESOPHAGEAL ECHOCARDIOGRAM (TEE);  Surgeon: Jerline Pain, MD;  Location: [facility] ENDOSCOPY;  Service: Cardiovascular;  Laterality: N/A;   TOTAL SHOULDER ARTHROPLASTY Right 12/16/2020   Procedure: RIGHT TOTAL SHOULDER ARTHROPLASTY;  Surgeon: Meredith Pel, MD;  Location: [facility];  Service: Orthopedics;  Laterality: Right;   Social History   Occupational History    Occupation: Physiological scientist  Tobacco Use   Smoking status: Never   Smokeless tobacco: Never  Vaping Use   Vaping Use: Never used  Substance and Sexual Activity   Alcohol use: No   Drug use: No   Sexual activity: Not on file

## 2021-03-24 ENCOUNTER — Encounter: Payer: Medicare Other | Admitting: Physical Therapy

## 2021-03-26 NOTE — Progress Notes (Signed)
[facility] Summary Report / Loop Recorder 

## 2021-03-31 ENCOUNTER — Ambulatory Visit (INDEPENDENT_AMBULATORY_CARE_PROVIDER_SITE_OTHER): Payer: Medicare Other

## 2021-03-31 DIAGNOSIS — I63512 Cerebral infarction due to unspecified occlusion or stenosis of left middle cerebral artery: Secondary | ICD-10-CM | POA: Diagnosis not present

## 2021-04-01 LAB — CUP PACEART REMOTE DEVICE CHECK
Date Time Interrogation Session: 20220902012823
Implantable Pulse Generator Implant Date: 20200210

## 2021-04-09 NOTE — Progress Notes (Signed)
[facility] Summary Report / Loop Recorder 

## 2021-04-10 ENCOUNTER — Ambulatory Visit: Payer: Medicare Other | Admitting: Internal Medicine

## 2021-04-10 ENCOUNTER — Other Ambulatory Visit: Payer: Self-pay

## 2021-04-10 VITALS — BP 116/76 | HR 66 | Ht 65.0 in | Wt 172.0 lb

## 2021-04-10 DIAGNOSIS — I639 Cerebral infarction, unspecified: Secondary | ICD-10-CM | POA: Diagnosis not present

## 2021-04-10 NOTE — Progress Notes (Signed)
HPI Edward Glass returns today for followup of a cryptogenic stroke. He is a pleasant 65 yo man with a cryptogenic stroke, s/p ILR insertion. He has undergone rehab. He has weakness in the right arm but recently undergone shoulder replacement surgery by Dr. Marlou Sa. he denies chest pain or sob. He is exercising again but somewhat limited.  No Known Allergies   Current Outpatient Medications  Medication Sig Dispense Refill   acetaminophen (TYLENOL) 325 MG tablet Take 2 tablets (650 mg total) by mouth every 4 (four) hours as needed for mild pain (or temp > 37.5 C (99.5 F)).     amLODipine (NORVASC) 5 MG tablet Take 5 mg by mouth daily.     Baclofen 5 MG TABS Take 5 mg by mouth 3 (three) times daily as needed. 90 tablet 5   clopidogrel (PLAVIX) 75 MG tablet Take 1 tablet (75 mg total) by mouth daily. 90 tablet 0   diclofenac sodium (VOLTAREN) 1 % GEL Apply 2 g topically 4 (four) times daily. (Patient taking differently: Apply 2 g topically daily.) 100 g 5   multivitamin (ONE-A-DAY MEN'S) TABS tablet Take 1 tablet by mouth daily.     oxyCODONE (OXY IR/ROXICODONE) 5 MG immediate release tablet Take 1 tablet (5 mg total) by mouth every 4 (four) hours as needed for moderate pain (pain score 4-6). 30 tablet 0   rosuvastatin (CRESTOR) 20 MG tablet Take 1 tablet (20 mg total) by mouth daily. 90 tablet 3   No current facility-administered medications for this visit.     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   Past Surgical History:  Procedure Laterality Date   COLONOSCOPY     HERNIA REPAIR  2006   left tricep surgery     LOOP RECORDER INSERTION N/A 09/04/2018   Procedure: LOOP RECORDER INSERTION;  Surgeon: Evans Lance, MD;  Location: [facility];  Service: Cardiovascular;  Laterality: N/A;   TEE WITHOUT CARDIOVERSION N/A 09/04/2018   Procedure: TRANSESOPHAGEAL ECHOCARDIOGRAM (TEE);  Surgeon: Jerline Pain, MD;  Location[facility] ENDOS[facility];  Service: Cardiovascular;  Laterality: N/A;   TOTAL SHOULDER ARTHROPLASTY Right 12/16/2020   Procedure: RIGHT TOTAL SHOULDER ARTHROPLASTY;  Surgeon: Meredith Pel, MD;  Location: [facility];  Service: Orthopedics;  Laterality: Right;     Family History  Problem Relation Age of Onset   Kidney failure Mother    Cancer Mother    Prostate cancer Father    Cancer Father    Diabetes Father    Colon cancer Neg Hx    Esophageal cancer Neg Hx    Rectal cancer Neg Hx    Stomach cancer Neg Hx      Social History  Socioeconomic History   Marital status: Divorced    Spouse name: Not on file   Number of children: 2   Years of education: Not on file   Highest education level: Not on file  Occupational History   Occupation: Physiological scientist  Tobacco Use   Smoking status: Never   Smokeless tobacco: Never  Vaping Use   Vaping Use: Never used  Substance and Sexual Activity   Alcohol use: No   Drug use: No   Sexual activity: Not on file  Other Topics Concern   Not on file  Social History Narrative   Not on file   Social Determinants of Health   Financial Resource Strain: Not on file  Food Insecurity: Not on file  Transportation Needs: Not on file  Physical Activity: Not on file  Stress: Not on file  Social Connections: Not on  file  Intimate Partner Violence: Not on file     BP 116/76   Pulse 66   Ht '5\' 5"'$  (1.651 m)   Wt 172 lb (78 kg)   SpO2 97%   BMI 28.62 kg/m   Physical Exam:  Well appearing NAD HEENT: Unremarkable Neck:  No JVD, no thyromegally Lymphatics:  No adenopathy Back:  No CVA tenderness Lungs:  Clear with no wheezes HEART:  Regular rate rhythm, no murmurs, no rubs, no clicks Abd:  soft, positive bowel sounds, no organomegally, no rebound, no guarding Ext:  2 plus pulses, no edema, no cyanosis, no clubbing Skin:  No rashes no nodules Neuro:  CN II through XII intact, motor grossly intact  EKG - nsr  DEVICE  Normal device function.  See PaceArt for details. No arrhythmias  Assess/Plan:  Cryptogenic stroke - he has not had any arrhythmias noted on his ILR. He will continue plavix. Dyslipidemia - he will continue crestor.  Edward Overlie Teige Rountree,MD

## 2021-04-17 ENCOUNTER — Other Ambulatory Visit: Payer: Self-pay

## 2021-04-17 ENCOUNTER — Ambulatory Visit
Admission: RE | Admit: 2021-04-17 | Discharge: 2021-04-17 | Disposition: A | Discharge status: Home / Self Care | Payer: Medicare Other | Source: Ambulatory Visit | Attending: Obstetrics and Gynecology | Admitting: Obstetrics and Gynecology

## 2021-04-17 ENCOUNTER — Other Ambulatory Visit: Payer: Self-pay | Admitting: Obstetrics and Gynecology

## 2021-04-17 DIAGNOSIS — Z111 Encounter for screening for respiratory tuberculosis: Secondary | ICD-10-CM

## 2021-04-30 LAB — CUP PACEART REMOTE DEVICE CHECK
Date Time Interrogation Session: 20221003012743
Implantable Pulse Generator Implant Date: 20200210

## 2021-05-04 ENCOUNTER — Ambulatory Visit (INDEPENDENT_AMBULATORY_CARE_PROVIDER_SITE_OTHER): Payer: Medicare Other

## 2021-05-04 DIAGNOSIS — I639 Cerebral infarction, unspecified: Secondary | ICD-10-CM | POA: Diagnosis not present

## 2021-05-08 ENCOUNTER — Other Ambulatory Visit: Payer: Self-pay | Admitting: Adult Health

## 2021-05-13 NOTE — Progress Notes (Signed)
[facility] Summary Report / Loop Recorder 

## 2021-05-28 LAB — CUP PACEART REMOTE DEVICE CHECK
Date Time Interrogation Session: 20221103012859
Implantable Pulse Generator Implant Date: 20200210

## 2021-06-08 ENCOUNTER — Ambulatory Visit (INDEPENDENT_AMBULATORY_CARE_PROVIDER_SITE_OTHER): Payer: Medicare Other

## 2021-06-08 DIAGNOSIS — I639 Cerebral infarction, unspecified: Secondary | ICD-10-CM | POA: Diagnosis not present

## 2021-06-16 NOTE — Progress Notes (Signed)
[facility] Summary Report / Loop Recorder 

## 2021-06-17 ENCOUNTER — Ambulatory Visit: Payer: Self-pay

## 2021-06-17 ENCOUNTER — Ambulatory Visit: Payer: Medicare Other | Admitting: Orthopedic Surgery

## 2021-06-17 ENCOUNTER — Other Ambulatory Visit: Payer: Self-pay

## 2021-06-17 DIAGNOSIS — Z96611 Presence of right artificial shoulder joint: Secondary | ICD-10-CM | POA: Diagnosis not present

## 2021-06-21 ENCOUNTER — Encounter: Payer: Self-pay | Admitting: Orthopedic Surgery

## 2021-06-21 NOTE — Progress Notes (Signed)
Office Visit Note   Patient: Edward Glass           Date of Birth: 1956-01-16           MRN: [id number] Visit Date: 06/17/2021 Requested by: Edward Mires, MD [address] [place],  [place] [address] PCP: Edward Mires, MD  Subjective: Chief Complaint  Patient presents with   Right Shoulder - Follow-up    37mo post op from shoulder arthroplasty    HPI: Edward Glass is a 65 year old patient with right total shoulder performed 12/16/2020.  Feels like he is not getting better.  Describes pain and stiffness but no fevers or chills.  Did have a stroke affecting that side.  He is trying to do weights but without much success.  He is on Plavix.              ROS: All systems reviewed are negative as they relate to the chief complaint within the history of present illness.  Patient denies  fevers or chills.   Assessment & Plan: Visit Diagnoses:  1. History of arthroplasty of right shoulder     Plan: Impression is stiff right total shoulder.  Patient had very good bone at the time of surgery.  Options at this time would be living with what he has or conversion to reverse replacement.  We could try a little bit more aggressive type physical therapy as well.  Radiographs AP and outlet view appear intact.  Plan is 55-month return with Edward Glass physical therapy appointment.  Follow-Up Instructions: No follow-ups on file.   Orders:  Orders Placed This Encounter  Procedures   XR Shoulder Right   No orders of the defined types were placed in this encounter.     Procedures: No procedures performed   Clinical Data: No additional findings.  Objective: Vital Signs: There were no vitals taken for this visit.  Physical Exam:   Constitutional: Patient appears well-developed HEENT:  Head: Normocephalic Eyes:EOM are normal Neck: Normal range of motion Cardiovascular: Normal rate Pulmonary/chest: Effort normal Neurologic: Patient is alert Skin: Skin is  warm Psychiatric: Patient has normal mood and affect   Ortho Exam: Ortho exam demonstrates well-healed surgical incision.  Deltoid fires.  Range of motion is limited actively and passively to below 70 degrees of forward flexion and AB duction.  Subscap strength feels intact.  Shoulder itself is not warm.  No lymphadenopathy.  Specialty Comments:  No specialty comments available.  Imaging: No results found.   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   Slow transit constipation    Benign prostatic hyperplasia with urinary retention    Spastic hemiparesis of right dominant side (HCC)    Chronic right shoulder pain    Primary osteoarthritis of right shoulder    Left basal ganglia embolic stroke ([place]) 78/93/8101   CVA (cerebral vascular accident) ([place]) 08/31/2018   Elevated aspartate aminotransferase level 06/16/2015   H/O nutritional disorder 06/16/2015   Excessive salivation 06/16/2015   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   Heart palpitations 06/16/2015   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   Hypogonadism male 08/21/2014   Screening for prostate cancer 08/21/2014   Atypical chest pain 08/10/2013   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   Past Medical History:  Diagnosis Date   Arthritis    right shoulder   Atypical chest pain 08/10/2013   BPH (benign prostatic hyperplasia)  COVID-19 07/2020   CVA (cerebral vascular accident) ([place]) 08/2018   spastic hemiparesis of right dominant side   Dysphagia    Elevated aspartate aminotransferase level 06/16/2015   Excessive salivation 06/16/2015   H/O nutritional disorder 06/16/2015   H/O vitamin D deficiency    History of loop recorder 2020   Hx of adenomatous colonic polyps 2016   Hyperlipidemia    Hypertension    Hypogonadism male 08/21/2014   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   Screening for prostate cancer 08/21/2014   Testicular hypofunction  06/16/2015    Family History  Problem Relation Age of Onset   Kidney failure Mother    Cancer Mother    Prostate cancer Father    Cancer Father    Diabetes Father    Colon cancer Neg Hx    Esophageal cancer Neg Hx    Rectal cancer Neg Hx    Stomach cancer Neg Hx     Past Surgical History:  Procedure Laterality Date   COLONOSCOPY     HERNIA REPAIR  2006   left tricep surgery     LOOP RECORDER INSERTION N/A 09/04/2018   Procedure: LOOP RECORDER INSERTION;  Surgeon: Evans Lance, MD;  Location: [facility];  Service: Cardiovascular;  Laterality: N/A;   TEE WITHOUT CARDIOVERSION N/A 09/04/2018   Procedure: TRANSESOPHAGEAL ECHOCARDIOGRAM (TEE);  Surgeon: Jerline Pain, MD;  Location: [facility] END[facility];  Service: Cardiovascular;  Laterality: N/A;   TOTAL SHOULDER ARTHROPLASTY Right 12/16/2020   Procedure: RIGHT TOTAL SHOULDER ARTHROPLASTY;  Surgeon: Meredith Pel, MD;  Location: [facility];  Service: Orthopedics;  Laterality: Right;   Social History   Occupational History   Occupation: Physiological scientist  Tobacco Use   Smoking status: Never   Smokeless tobacco: Never  Vaping Use   Vaping Use: Never used  Substance and Sexual Activity   Alcohol use: No   Drug use: No   Sexual activity: Not on file

## 2021-06-29 ENCOUNTER — Ambulatory Visit (INDEPENDENT_AMBULATORY_CARE_PROVIDER_SITE_OTHER): Payer: Medicare Other

## 2021-06-29 DIAGNOSIS — I639 Cerebral infarction, unspecified: Secondary | ICD-10-CM | POA: Diagnosis not present

## 2021-06-30 LAB — CUP PACEART REMOTE DEVICE CHECK
Date Time Interrogation Session: 20221204002910
Implantable Pulse Generator Implant Date: 20200210

## 2021-07-08 NOTE — Progress Notes (Signed)
[facility] Summary Report / Loop Recorder 

## 2021-07-23 ENCOUNTER — Encounter: Payer: Self-pay | Admitting: Adult Health

## 2021-07-23 ENCOUNTER — Ambulatory Visit: Payer: Medicare Other | Admitting: Adult Health

## 2021-07-23 VITALS — BP 142/84 | HR 64 | Ht 65.0 in | Wt 182.0 lb

## 2021-07-23 DIAGNOSIS — G8111 Spastic hemiplegia affecting right dominant side: Secondary | ICD-10-CM

## 2021-07-23 DIAGNOSIS — Z8673 Personal history of transient ischemic attack (TIA), and cerebral infarction without residual deficits: Secondary | ICD-10-CM | POA: Diagnosis not present

## 2021-07-23 NOTE — Patient Instructions (Signed)
Contact neuro rehab PT/OT next week to restart therapies for increased spasticity at times  Continue baclofen 5mg  three times daily  Continue clopidogrel 75 mg daily  and Crestor  for secondary stroke prevention  Continue to follow up with PCP regarding cholesterol and blood pressure management  Maintain strict control of hypertension with blood pressure goal below 130/90 and cholesterol with LDL cholesterol (bad cholesterol) goal below 70 mg/dL.   Signs of a Stroke? Follow the BEFAST method:  Balance Watch for a sudden loss of balance, trouble with coordination or vertigo Eyes Is there a sudden loss of vision in one or both eyes? Or double vision?  Face: Ask the person to smile. Does one side of the face droop or is it numb?  Arms: Ask the person to raise both arms. Does one arm drift downward? Is there weakness or numbness of a leg? Speech: Ask the person to repeat a simple phrase. Does the speech sound slurred/strange? Is the person confused ? Time: If you observe any of these signs, call 911.     Followup in the future with me in 6 months or call earlier if needed      Thank you for coming to see [place] at [facility]. I hope we have been able to provide you high quality care today.  You may receive a patient satisfaction survey over the next few weeks. We would appreciate your feedback and comments so that we may continue to improve ourselves and the health of our patients.

## 2021-07-23 NOTE — Progress Notes (Signed)
[facility] [address] [place]. [place] [address] [phone]       STROKE FOLLOW UP NOTE  [name] Date of Birth:  12/28/1955 Medical Record Number:  [id number]   Reason for Referral: Cryptogenic stroke follow up [place] provider: Dr. Leonie Man PCP: Katherina Mires, MD     CHIEF COMPLAINT:  Chief Complaint  Patient presents with   Follow-up    Rm 3 here alone for f/u- pt reports he has been doing ok since his last visit- some increased stiffness has been noted on the right side.      HPI:  Update 07/23/2021 JM: patient returns for 6 months stroke follow up. C/o right sided spasticity with slight increase in spasticity at times over the past couple of months, continues on baclofen 5 mg 3 times daily. Use of quad cane - no recent falls. Interested in doing additional therapy if able.  Dysarthria stable. No new stroke symptoms.  Compliant on Plavix and Crestor.  Blood pressure today 142/84.  Loop recorder has not shown atrial fibrillation thus far.  No further concerns.    History provided for reference purposes only Update 01/15/2021 JM: Edward Glass returns for 84-month stroke follow-up unaccompanied.  He has been stable from stroke standpoint without new stroke/TIA symptoms and residual right sided spasticity and dysarthria.  Continues to use quad cane and denies any recent falls.  Use of baclofen 5 mg 3 times daily with benefit.  Compliant on Plavix without associated side effects.  He is concerned regarding possible statin myalgias as he will experience [name] pain with cramping shortly after taking atorvastatin.  Blood pressure today 137/81.  Loop recorder has not shown atrial fibrillation thus far.  Underwent right total shoulder arthroplasty 12/16/2020 by Dr. Marlou Sa without complication and recently started therapy.  No new concerns at this time.  Update 07/15/2020 JM: Edward Glass returns for 35-month stroke follow-up unaccompanied. Reports residual right spatic  hemiparesis and dysarthria. Has been using baclofen for spasticity 5mg  three times daily but does experience increased stiffness upon awakening and towards the end of the day. Feels as though speech has been improving. Denies new or worsening stroke/TIA symptoms. At prior visit, complained of right shoulder pain worsened post stroke. Evaluated by orthopedics with note personally reviewed and x-ray revealing severe end-stage OA with progression compared to prior imaging in 08/2018.  He has since received 2 injections with benefit.  Remains on Plavix and atorvastatin without side effects for secondary stroke prevention. Blood pressure today 127/77. Loop recorder has not shown atrial fibrillation thus far.  Previously on Prozac for depression/anxiety post stroke but he has since been able to discontinue this without difficulty.  No further concerns at this time.  Update 01/10/2020 JM: Edward Glass returns for follow-up regarding stroke in 08/2018.  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  Denies new stroke/TIA symptoms.  Currently ambulating with a quad cane and denies any recent falls.  Reports typically caring cane at his side "just in case" but he is here to walk without it.  Initiated baclofen 5 mg twice daily as needed for spasticity.  Difficulty tolerating daytime dose due to increased fatigue has continued 5 mg dosage at night with only mild benefit.  He questions benefit of restarting Botox as he only received 2 rounds.  He does report right shoulder pain which is chronic but worsened post stroke.  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  Continues on clopidogrel and atorvastatin for secondary stroke prevention.  Prior lipid panel on 10/09/2019 by PCP  showed LDL 51.  Blood pressure today 122/77.  Loop recorder is not shown atrial fibrillation thus far.  No further concerns at this time.  Update 09/06/2019 JM: Edward Glass is a 65 year old male who is being seen today for cryptogenic stroke follow-up.  He was previously seen via virtual visit on 11/08/2018 and has not previously followed up as recommended.  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  He continues to ambulate with a Rollator walker and denies any recent falls.  Greatest concern today is including continued right-sided spasticity.  Trial of Botox injections but denies benefit as well as trial of Zanaflex but patient self discontinued due to reported side effect of upset stomach.  Completed 3 months DAPT and continues on Plavix alone without bleeding or bruising.  Continues on atorvastatin without myalgias.  Blood pressure today 116/78.  Continues to follow with PCP regularly for HTN and HLD management/monitoring.  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  Loop recorder has not shown atrial fibrillation thus far.  Denies new or worsening stroke/TIA symptoms.  Initial visit via virtual 11/08/2018: He has been stable from a stroke standpoint with residual deficits of aphasia, cognitive deficits and right hemiparesis but does endorse improvement.  He continues to participate at neuro rehab PT/OT/ST along with continuing exercises at home.  He is currently ambulating with a quad cane and denies any recent falls.  He does need some assistance with bathing and dressing but continues to be able to do more on his own each day.  He no longer has any difficulties with swallowing and is currently on a regular diet.  He does endorse increased salivation.  He continues on aspirin and Plavix without side effects of bleeding or bruising.  Continues on atorvastatin without side effects myalgias.  Blood pressures not routinely  monitored at home and encourage daughter and patient to obtain cough to start monitoring.  He does endorse occasional depression difficulties such as feeling down or increased anxiety.  It was recommended to initiate Prozac during hospital admission but states once he went home, he did not continue as he felt it was not needed.  Per review of loop recorder, no report of atrial fibrillation found but per patient and daughter, they were contacted by cardiology stating arrhythmia had been found and is in the process of scheduling visit with cardiology.  No further concerns at this time.  Denies new or worsening stroke/TIA symptoms.  Stroke admission 08/30/2018: Edward Glass is a 65 year old male who presented to [facility] ED with right-sided weakness and aphasia.   CT head reviewed which showed age indeterminate small vessel ischemia in the left BG.  CTA showed possible left M1 occlusion versus severe stenosis.  CT perfusion no infarct for but large penumbra.  MRI  brain reviewed and showed left MCA patchy infarcts mostly concentrated in the left BG. He arrived greater than 24h from time since last well, therefore no acute stroke interventions done.  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  He unfortunately had neurological worsening with severe aphasia and dense right hemiplegia with extension of deficits on  09/01/2018.  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   When pts dtrs arrive, they both explain that in 2016 after tricep repair surgery they noted a possible arrhythmia and placed him on holter monitor for 30d, but was unrevealing.  2D echo showed an EF of 55 to 60%.  TCD bubble study negative for PFO.  Lower extremity venous Dopplers negative for DVT.  TEE showed small PFO/bubble crossover noted during Valsalva which was not felt to be clinically significant along with no evidence of cardiac  thrombus therefore loop recorder placed.   Recommended DAPT for 3 months then Plavix alone due to severe left M1 stenosis.  HTN stable recommended long-term BP goal 1 30-1 50 due left M1 stenosis.  HLD 104 and initiated atorvastatin 40 mg daily.  Other stroke risk factors include family history of stroke but no personal history of stroke.  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  Initiated Prozac on 08/31/2018.  He had residual deficits of right hemiparesis, dysarthria and mixed aphasia and was discharged to [facility] for ongoing therapy.      ROS:   14 system review of systems performed and negative with exception of those listed in HPI  PMH:  Past Medical History:  Diagnosis Date   Arthritis    right shoulder   Atypical chest pain 08/10/2013   BPH (benign prostatic hyperplasia)    COVID-19 07/2020   CVA (cerebral vascular accident) ([place]) 08/2018   spastic hemiparesis of right dominant side   Dysphagia    Elevated aspartate aminotransferase level 06/16/2015   Excessive salivation 06/16/2015   H/O nutritional disorder 06/16/2015   H/O vitamin D deficiency    History of loop recorder 2020   Hx of adenomatous colonic polyps 2016   Hyperlipidemia    Hypertension    Hypogonadism male 08/21/2014   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   Screening for prostate cancer 08/21/2014   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    PSH:  Past Surgical History:  Procedure Laterality Date   COLONOSCOPY     HERNIA REPAIR  2006   left tricep surgery     LOOP RECORDER INSERTION N/A 09/04/2018   Procedure: LOOP RECORDER INSERTION;  Surgeon: Evans Lance, MD;  Location: [facility];  Service: Cardiovascular;  Laterality: N/A;   TEE WITHOUT CARDIOVERSION N/A 09/04/2018   Procedure: TRANSESOPHAGEAL ECHOCARDIOGRAM (TEE);  Surgeon: Jerline Pain, MD;  Location: [facility];   Service: Cardiovascular;  Laterality: N/A;   TOTAL SHOULDER ARTHROPLASTY Right 12/16/2020   Procedure: RIGHT TOTAL SHOULDER ARTHROPLASTY;  Surgeon: Meredith Pel, MD;  Location: [place];  Service: Orthopedics;  Laterality: Right;    Social History:  Social History   Socioeconomic History   Marital status: Divorced    Spouse name: Not on file   Number of children: 2   Years of education: Not on file   Highest education level: Not on file  Occupational History   Occupation: Physiological scientist  Tobacco Use   Smoking status: Never   Smokeless tobacco: Never  Vaping Use   Vaping Use: Never used  Substance and Sexual Activity   Alcohol use: No   Drug use: No   Sexual activity: Not on file  Other Topics Concern   Not on file  Social History Narrative   Not on file   Social  Determinants of Health   Financial Resource Strain: Not on file  Food Insecurity: Not on file  Transportation Needs: Not on file  Physical Activity: Not on file  Stress: Not on file  Social Connections: Not on file  Intimate Partner Violence: Not on file    Family History:  Family History  Problem Relation Age of Onset   Kidney failure Mother    Cancer Mother    Prostate cancer Father    Cancer Father    Diabetes Father    Colon cancer Neg Hx    Esophageal cancer Neg Hx    Rectal cancer Neg Hx    Stomach cancer Neg Hx     Medications:   Current Outpatient Medications on File Prior to Visit  Medication Sig Dispense Refill   acetaminophen (TYLENOL) 325 MG tablet Take 2 tablets (650 mg total) by mouth every 4 (four) hours as needed for mild pain (or temp > 37.5 C (99.5 F)).     amLODipine (NORVASC) 5 MG tablet Take 5 mg by mouth daily.     Baclofen 5 MG TABS Take 5 mg by mouth 3 (three) times daily as needed. 90 tablet 5   clopidogrel (PLAVIX) 75 MG tablet TAKE 1 TABLET BY MOUTH EVERY DAY 90 tablet 0   diclofenac sodium (VOLTAREN) 1 % GEL Apply 2 g topically 4 (four) times daily. (Patient taking  differently: Apply 2 g topically daily.) 100 g 5   multivitamin (ONE-A-DAY MEN'S) TABS tablet Take 1 tablet by mouth daily.     rosuvastatin (CRESTOR) 20 MG tablet Take 1 tablet (20 mg total) by mouth daily. 90 tablet 3   No current facility-administered medications on file prior to visit.    Allergies:  No Known Allergies   Physical Exam  Vitals:   07/23/21 1103  BP: (!) 142/84  Pulse: 64  SpO2: 95%  Weight: 182 lb (82.6 kg)  Height: 5\' 5"  (1.651 m)   Body mass index is 30.29 kg/m. No results found.  General: well developed, well nourished, pleasant middle-aged male, seated, in no evident distress Head: head normocephalic and atraumatic.   Neck: supple with no carotid or supraclavicular bruits Cardiovascular: regular rate and rhythm, no murmurs Musculoskeletal: limited right shoulder ROM Skin:  no rash/petichiae Vascular:  Normal pulses all extremities   Neurologic Exam Mental Status: Awake and fully alert. Mild dysarthria with hypophonia. Oriented to place and time. Recent and remote memory intact. Attention span, concentration and fund of knowledge appropriate. Mood and affect appropriate.  Cranial Nerves: Pupils equal, briskly reactive to light. Extraocular movements full without nystagmus. Visual fields full to confrontation. Hearing intact. Facial sensation intact. Face, tongue, palate moves normally and symmetrically.  Motor: Full strength in all tested extremities except increased tone RUE and [name] Sensory.: intact to touch , pinprick , position and vibratory sensation.  Coordination: Rapid alternating movements normal in all extremities. Finger-to-nose and heel-to-shin performed accurately on left side.  Unable to adequately assess right side due to increased tone Gait and Station: Arises from chair without difficulty. Stance is normal. Gait demonstrates  slow hemiplegic gait and stiffened right leg with use of cane. Reflexes: 2+ RUE and [name] and 1+ left side. Toes  downgoing.        ASSESSMENT: Edward Glass is a 65 y.o. year old male here with left MCA patchy infarct secondary to left M1 occlusion with embolic pattern due to unclear etiology on 08/30/2018 and neurological worsening with extension of deficits on 09/01/2018.  Loop recorder placed which has not shown atrial fibrillation thus far.  Vascular risk factors include intracranial stenosis, HLD and HTN.  Residual stroke deficits of right upper and lower spasticity and dysarthria with ongoing improvement    PLAN:  Cryptogenic left MCA infarct:  Residual RUE and [name] spasticity, gait impairment and dysarthria.  Continue baclofen 5 mg 3 times daily -hesitant to increase dose due to potential increased fatigue. Restart PT for increased spasticity.  Continue clopidogrel 75 mg daily  and Crestor for secondary stroke prevention.  Loop recorder negative for atrial fibrillation thus far -continuously monitored by cardiology Discussed secondary stroke prevention measures and importance of close PCP follow-up for aggressive stroke risk factor management HTN: BP goal<130/90. Stable on amlodipine per PCP HLD: LDL goal<70.  On Crestor with lipid panel routinely monitored by PCP.  Intolerant to atorvastatin.    Follow up in 6 months or call earlier if needed   CC: Katherina Mires, MD  I spent 31 minutes of face-to-face and non-face-to-face time with patient.  This included previsit chart review, lab review, study review, order entry, electronic health record documentation, patient education and discussion regarding prior stroke and residual deficits, secondary stroke prevention measures and aggressive stroke risk factor management and answered all other questions to patient satisfaction   Frann Rider, [facility]  [facility] [address] [place], [place] [address]  Phone [phone] Fax [phone] Note: This document was prepared with digital dictation and  possible smart phrase technology. Any transcriptional errors that result from this process are unintentional.

## 2021-07-29 ENCOUNTER — Ambulatory Visit: Payer: Medicare Other | Admitting: Student

## 2021-07-29 ENCOUNTER — Other Ambulatory Visit: Payer: Self-pay

## 2021-07-29 ENCOUNTER — Encounter: Payer: Self-pay | Admitting: Student

## 2021-07-29 VITALS — BP 120/72 | HR 77 | Ht 65.0 in | Wt 185.0 lb

## 2021-07-29 DIAGNOSIS — I63512 Cerebral infarction due to unspecified occlusion or stenosis of left middle cerebral artery: Secondary | ICD-10-CM

## 2021-07-29 DIAGNOSIS — I639 Cerebral infarction, unspecified: Secondary | ICD-10-CM | POA: Diagnosis not present

## 2021-07-29 DIAGNOSIS — R0609 Other forms of dyspnea: Secondary | ICD-10-CM

## 2021-07-29 LAB — CBC
Hematocrit: 47.4 % (ref 37.5–51.0)
Hemoglobin: 15.7 g/dL (ref 13.0–17.7)
MCH: 28.8 pg (ref 26.6–33.0)
MCHC: 33.1 g/dL (ref 31.5–35.7)
MCV: 87 fL (ref 79–97)
Platelets: 335 10*3/uL (ref 150–450)
RBC: 5.46 x10E6/uL (ref 4.14–5.80)
RDW: 13.7 % (ref 11.6–15.4)
WBC: 6.3 10*3/uL (ref 3.4–10.8)

## 2021-07-29 LAB — BASIC METABOLIC PANEL
BUN/Creatinine Ratio: 33 — ABNORMAL HIGH (ref 10–24)
BUN: 24 mg/dL (ref 8–27)
CO2: 24 mmol/L (ref 20–29)
Calcium: 9.8 mg/dL (ref 8.6–10.2)
Chloride: 102 mmol/L (ref 96–106)
Creatinine, Ser: 0.72 mg/dL — ABNORMAL LOW (ref 0.76–1.27)
Glucose: 108 mg/dL — ABNORMAL HIGH (ref 70–99)
Potassium: 4.5 mmol/L (ref 3.5–5.2)
Sodium: 139 mmol/L (ref 134–144)
eGFR: 101 mL/min/{1.73_m2} (ref >59)

## 2021-07-29 LAB — CUP PACEART INCLINIC DEVICE CHECK
Date Time Interrogation Session: 20230104090711
Implantable Pulse Generator Implant Date: 20200210

## 2021-07-29 LAB — TSH: TSH: 3.63 u[IU]/mL (ref 0.450–4.500)

## 2021-07-29 NOTE — Patient Instructions (Signed)
Medication Instructions:  Your physician recommends that you continue on your current medications as directed. Please refer to the Current Medication list given to you today.  *If you need a refill on your cardiac medications before your next appointment, please call your pharmacy*   Lab Work: TODAY: BMET, CBC, TSH  If you have labs (blood work) drawn today and your tests are completely normal, you will receive your results only by: [place] (if you have MyChart) OR A paper copy in the mail If you have any lab test that is abnormal or we need to change your treatment, we will call you to review the results.   Testing/Procedures: Your physician has requested that you have an echocardiogram. Echocardiography is a painless test that uses sound waves to create images of your heart. It provides your doctor with information about the size and shape of your heart and how well your hearts chambers and valves are working. This procedure takes approximately one hour. There are no restrictions for this procedure.   Follow-Up: At [facility], you and your health needs are our priority.  As part of our continuing mission to provide you with exceptional heart care, we have created designated Provider Care Teams.  These Care Teams include your primary Cardiologist (physician) and Advanced Practice Providers (APPs -  Physician Assistants and Nurse Practitioners) who all work together to provide you with the care you need, when you need it.   Your next appointment:   As scheduled

## 2021-07-29 NOTE — Progress Notes (Signed)
Electrophysiology Office Note Date: 07/29/2021  ID:  Edward Glass, Edward Glass 1956/05/26, MRN [id number]  PCP: Edward Mires, MD Primary Cardiologist: None Electrophysiologist: Edward Peru, MD   CC: ILR follow-up  Edward Glass is a 66 y.o. male seen today for Dr. Lovena Glass . he presents today for routine electrophysiology followup.  Since last being seen in our clinic, the patient reports SOB several days a week. He has a difficult time clarifying.  He rides a recumbent bike at lv 3 for 45 minutes without undue SOB, BUT he has SOB with just walking on flat ground at times which is accompanied by tachy-palpitations. He denies chest Glass, PND, orthopnea, nausea, vomiting, dizziness, syncope, edema, weight gain, or early satiety.  Device History: Medtronic loop recorder implanted 08/2018 for Cryptogenic Stroke  Past Medical History:  Diagnosis Date   Arthritis    right shoulder   Atypical chest Glass 08/10/2013   BPH (benign prostatic hyperplasia)    COVID-19 07/2020   CVA (cerebral vascular accident) ([facility]) 08/2018   spastic hemiparesis of right dominant side   Dysphagia    Elevated aspartate aminotransferase level 06/16/2015   Excessive salivation 06/16/2015   H/O nutritional disorder 06/16/2015   H/O vitamin D deficiency    History of loop recorder 2020   Hx of adenomatous colonic polyps 2016   Hyperlipidemia    Hypertension    Hypogonadism male 08/21/2014   Leg varices 02/07/2012   Screening for prostate cancer 08/21/2014   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   Past Surgical History:  Procedure Laterality Date   COLONOSCOPY     HERNIA REPAIR  2006   left tricep surgery     LOOP RECORDER INSERTION N/A 09/04/2018   Procedure: LOOP RECORDER INSERTION;  Surgeon: Edward Lance, MD;  Location: [facility];  Service: Cardiovascular;  Laterality: N/A;   TEE WITHOUT CARDIOVERSION N/A 09/04/2018   Procedure: TRANSESOPHAGEAL ECHOCARDIOGRAM (TEE);  Surgeon: Edward Pain, MD;   Location: [facility] ENDOSCOPY;  Service: Cardiovascular;  Laterality: N/A;   TOTAL SHOULDER ARTHROPLASTY Right 12/16/2020   Procedure: RIGHT TOTAL SHOULDER ARTHROPLASTY;  Surgeon: Edward Pel, MD;  Location: [facility];  Service: Orthopedics;  Laterality: Right;    Current Outpatient Medications  Medication Sig Dispense Refill   acetaminophen (TYLENOL) 325 MG tablet Take 2 tablets (650 mg total) by mouth every 4 (four) hours as needed for mild Glass (or temp > 37.5 C (99.5 F)).     albuterol (VENTOLIN HFA) 108 (90 Base) MCG/ACT inhaler as needed for shortness of breath.     amLODipine (NORVASC) 5 MG tablet Take 5 mg by mouth daily.     Baclofen 5 MG TABS Take 5 mg by mouth 3 (three) times daily as needed. 90 tablet 5   clopidogrel (PLAVIX) 75 MG tablet TAKE 1 TABLET BY MOUTH EVERY DAY 90 tablet 0   diclofenac Sodium (VOLTAREN) 1 % GEL Apply topically in the morning and at bedtime.     multivitamin (ONE-A-DAY MEN'S) TABS tablet Take 1 tablet by mouth daily.     rosuvastatin (CRESTOR) 20 MG tablet Take 1 tablet (20 mg total) by mouth daily. 90 tablet 3   No current facility-administered medications for this visit.    Allergies:   Patient has no known allergies.   Social History: Social History   Socioeconomic History   Marital status: Divorced    Spouse name: Not on file   Number of children: 2   Years of education: Not on  file   Highest education level: Not on file  Occupational History   Occupation: Physiological scientist  Tobacco Use   Smoking status: Never   Smokeless tobacco: Never  Vaping Use   Vaping Use: Never used  Substance and Sexual Activity   Alcohol use: No   Drug use: No   Sexual activity: Not on file  Other Topics Concern   Not on file  Social History Narrative   Not on file   Social Determinants of Health   Financial Resource Strain: Not on file  Food Insecurity: Not on file  Transportation Needs: Not on file  Physical Activity: Not on file  Stress: Not on file   Social Connections: Not on file  Intimate Partner Violence: Not on file    Family History: Family History  Problem Relation Age of Onset   Kidney failure Mother    Cancer Mother    Prostate cancer Father    Cancer Father    Diabetes Father    Colon cancer Neg Hx    Esophageal cancer Neg Hx    Rectal cancer Neg Hx    Stomach cancer Neg Hx      Review of Systems: All other systems reviewed and are otherwise negative except as noted above.  Physical Exam: Vitals:   07/29/21 0848  BP: 120/72  Pulse: 77  SpO2: 97%  Weight: 185 lb (83.9 kg)  Height: 5\' 5"  (1.651 m)     GEN- The patient is well appearing, alert and oriented x 3 today.   HEENT: normocephalic, atraumatic; sclera clear, conjunctiva pink; hearing intact; oropharynx clear; neck supple  Lungs- Clear to ausculation bilaterally, normal work of breathing.  No wheezes, rales, rhonchi Heart- Regular rate and rhythm, no murmurs, rubs or gallops  GI- soft, non-tender, non-distended, bowel sounds present  Extremities- no clubbing, cyanosis, or edema  MS- no significant deformity or atrophy Skin- warm and dry, no rash or lesion; PPM pocket well healed Psych- euthymic mood, full affect Neuro- strength and sensation are intact  PPM Interrogation- reviewed in detail today,  See PACEART report  EKG:  EKG is not ordered today. The ekg ordered 04/06/2021 shows NSR  Recent Labs: 12/10/2020: BUN 23; Creatinine, Ser 0.76; Hemoglobin 15.0; Platelets 324; Potassium 3.9; Sodium 137   Wt Readings from Last 3 Encounters:  07/29/21 185 lb (83.9 kg)  07/23/21 182 lb (82.6 kg)  04/10/21 172 lb (78 kg)     Other studies Reviewed: Additional studies/ records that were reviewed today include:  Previous EP office notes, Previous remote checks, Most recent labwork.   Assessment and Plan:  1. Cryptogenic Stroke s/p Medtronic Loop recorder Normal device function See Pace Art report No changes today  2. HLD Continue statin  3.  Dyspnea / tachy-palpitations History is slightly difficult with no SOB on 45 minutes of recumbent bike.  Update labs and echo Given symptom activator with tachy palpitations  Labs/ tests ordered today include:  Orders Placed This Encounter  Procedures   Basic metabolic panel   CBC   TSH   CUP PACEART INCLINIC DEVICE CHECK   ECHOCARDIOGRAM COMPLETE     Disposition:   Follow up with EP APP in 3 Months. Sooner pending results.     Jacalyn Lefevre, PA-C  07/29/2021 8:54 AM  [facility] [address] [place] [address] [phone] (office) [phone] (fax)

## 2021-08-03 ENCOUNTER — Ambulatory Visit (INDEPENDENT_AMBULATORY_CARE_PROVIDER_SITE_OTHER): Payer: Medicare Other

## 2021-08-03 DIAGNOSIS — I63512 Cerebral infarction due to unspecified occlusion or stenosis of left middle cerebral artery: Secondary | ICD-10-CM

## 2021-08-03 LAB — CUP PACEART REMOTE DEVICE CHECK
Date Time Interrogation Session: 20230108232240
Implantable Pulse Generator Implant Date: 20200210

## 2021-08-06 ENCOUNTER — Other Ambulatory Visit: Payer: Self-pay | Admitting: Adult Health

## 2021-08-10 ENCOUNTER — Ambulatory Visit: Payer: Medicare Other | Admitting: Occupational Therapy

## 2021-08-10 ENCOUNTER — Other Ambulatory Visit: Payer: Self-pay

## 2021-08-10 ENCOUNTER — Encounter: Payer: Self-pay | Admitting: Physical Therapy

## 2021-08-10 ENCOUNTER — Ambulatory Visit: Payer: Medicare Other | Attending: Adult Health | Admitting: Physical Therapy

## 2021-08-10 DIAGNOSIS — I69351 Hemiplegia and hemiparesis following cerebral infarction affecting right dominant side: Secondary | ICD-10-CM

## 2021-08-10 DIAGNOSIS — R2689 Other abnormalities of gait and mobility: Secondary | ICD-10-CM

## 2021-08-10 DIAGNOSIS — R278 Other lack of coordination: Secondary | ICD-10-CM | POA: Diagnosis present

## 2021-08-10 DIAGNOSIS — M25611 Stiffness of right shoulder, not elsewhere classified: Secondary | ICD-10-CM

## 2021-08-10 DIAGNOSIS — R2681 Unsteadiness on feet: Secondary | ICD-10-CM | POA: Diagnosis present

## 2021-08-10 DIAGNOSIS — G8111 Spastic hemiplegia affecting right dominant side: Secondary | ICD-10-CM | POA: Diagnosis not present

## 2021-08-10 DIAGNOSIS — M6281 Muscle weakness (generalized): Secondary | ICD-10-CM

## 2021-08-10 NOTE — Therapy (Signed)
[place] [place], [place], [address] Phone: [phone]   Fax:  [phone]  Physical Therapy Evaluation  Patient Details  Name: Edward Glass MRN: [id number] Date of Birth: Aug 16, 1955 Referring Provider (PT): Frann Rider   Encounter Date: 08/10/2021   PT End of Session - 08/10/21 1608     Visit Number 1    Number of Visits 7    Date for PT Re-Evaluation 09/21/21    Authorization Type BCBS    Authorization Time Period 08/10/21 to 09/21/21    PT Start Time 1400    PT Stop Time 0037    PT Time Calculation (min) 39 min    Activity Tolerance Patient tolerated treatment well    Behavior During Therapy [facility] for tasks assessed/performed             Past Medical History:  Diagnosis Date   Arthritis    right shoulder   Atypical chest pain 08/10/2013   BPH (benign prostatic hyperplasia)    COVID-19 07/2020   CVA (cerebral vascular accident) ([place]) 08/2018   spastic hemiparesis of right dominant side   Dysphagia    Elevated aspartate aminotransferase level 06/16/2015   Excessive salivation 06/16/2015   H/O nutritional disorder 06/16/2015   H/O vitamin D deficiency    History of loop recorder 2020   Hx of adenomatous colonic polyps 2016   Hyperlipidemia    Hypertension    Hypogonadism male 08/21/2014   Leg varices 02/07/2012   Screening for prostate cancer 08/21/2014   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    Past Surgical History:  Procedure Laterality Date   COLONOSCOPY     HERNIA REPAIR  2006   left tricep surgery     LOOP RECORDER INSERTION N/A 09/04/2018   Procedure: LOOP RECORDER INSERTION;  Surgeon: Evans Lance, MD;  Location: [facility];  Service: Cardiovascular;  Laterality: N/A;   TEE WITHOUT CARDIOVERSION N/A 09/04/2018   Procedure: TRANSESOPHAGEAL ECHOCARDIOGRAM (TEE);  Surgeon: Jerline Pain, MD;  Location: [facility] ENDOSCOPY;  Service: Cardiovascular;  Laterality: N/A;   TOTAL  SHOULDER ARTHROPLASTY Right 12/16/2020   Procedure: RIGHT TOTAL SHOULDER ARTHROPLASTY;  Surgeon: Meredith Pel, MD;  Location: [name];  Service: Orthopedics;  Laterality: Right;    There were no vitals filed for this visit.    Subjective Assessment - 08/10/21 1401     Subjective I had a stoke a couple of years ago. I'd like to improve my balance and work on the amount of spasticity in my leg. It doesn't hurt but it does lock it up a bit. I am on baclofen. No falls recently.    Patient Stated Goals improve balance, try to help spasticity in leg so it doesn't lock up    Currently in Pain? Yes    Pain Score 4     Pain Location Back    Pain Orientation Right;Lower    Pain Descriptors / Indicators Sore    Pain Type Acute pain    Pain Radiating Towards none    Pain Onset In the past 7 days    Pain Frequency Intermittent    Aggravating Factors  unsure/none    Pain Relieving Factors none    Effect of Pain on Daily Activities mild                [facility] PT Assessment - 08/10/21 1406       Assessment   Medical Diagnosis hx CVA  Referring Provider (PT) Frann Rider    Onset Date/Surgical Date --   couple of years ago   Next MD Visit [facility] in 6 months    Prior Therapy PT after total shoulder      Precautions   Precautions None      Balance Screen   Has the patient fallen in the past 6 months No    Has the patient had a decrease in activity level because of a fear of falling?  No    Is the patient reluctant to leave their home because of a fear of falling?  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    Leisure being active, watch TV, shooting pool      Observation/Other Assessments   Observations extensor tone noted R LE, unpredictable; limited R ankle DF ROM      Strength   Strength Assessment Site  Hip;Knee;Ankle    Right/Left Hip Right;Left    Right Hip Flexion 2+/5    Right Hip Extension 4/5   based off resisted bridge   Right Hip ABduction 3/5    Left Hip Flexion 4-/5    Left Hip Extension 4/5   based off resisted bridge   Left Hip ABduction --   unable to lay on R side   Right/Left Knee Right;Left    Right Knee Flexion 4-/5    Right Knee Extension 4/5    Left Knee Flexion 5/5    Left Knee Extension 5/5    Right/Left Ankle Right;Left    Right Ankle Dorsiflexion 4-/5    Left Ankle Dorsiflexion 5/5      Flexibility   Soft Tissue Assessment /Muscle Length yes    Hamstrings some extensor tone noted      Ambulation/Gait   Gait Comments short step lengths with circumduction gait, limitd hip flexion and reduced ankle DF      Standardized Balance Assessment   Standardized Balance Assessment Berg Balance Test      Berg Balance Test   Sit to Stand Able to stand without using hands and stabilize independently    Standing Unsupported Able to stand safely 2 minutes    Sitting with Back Unsupported but Feet Supported on Floor or Stool Able to sit safely and securely 2 minutes    Stand to Sit Sits safely with minimal use of hands    Transfers Able to transfer safely, minor use of hands    Standing Unsupported with Eyes Closed Able to stand 10 seconds safely    Standing Unsupported with Feet Together Able to place feet together independently and stand 1 minute safely    From Standing, Reach Forward with Outstretched Arm Can reach forward >12 cm safely (5")    From Standing Position, Pick up Object from Floor Able to pick up shoe safely and easily    From Standing Position, Turn to Look Behind Over each Shoulder Looks behind one side only/other side shows less weight shift    Turn 360 Degrees Able to turn 360 degrees safely but slowly    Standing Unsupported, Alternately Place Feet on Step/Stool Needs assistance to keep from falling or unable to try    Standing Unsupported, One Foot  in [employer] balance while stepping or standing    Standing on One Leg Tries to lift  leg/unable to hold 3 seconds but remains standing independently    Total Score 41                        Objective measurements completed on examination: See above findings.                PT Education - 08/10/21 1607     Education Details exam findings, POC; will give HEP next time. Will try some PT approaches to help with spasticity but this may be more of an MD/medication issue    Person(s) Educated Patient    Methods Explanation    Comprehension Verbalized understanding              PT Short Term Goals - 08/10/21 1619       PT SHORT TERM GOAL #1   Title Will be compliant with appropriate progressive HEP to be progressed PRN    Time 3    Period Weeks    Status New    Target Date 08/31/21      PT SHORT TERM GOAL #2   Title R ankle DF ROM to improve to at least 5 degrees actively    Time 3    Period Weeks    Status New               PT Long Term Goals - 08/10/21 1620       PT LONG TERM GOAL #1   Title MMT to have improved by at least 1 grade in all weak groups in order to improve balance and gait pattern    Time 6    Period Weeks    Status New    Target Date 09/21/21      PT LONG TERM GOAL #2   Title Will be independent with physical therapy based spasticity management techniques such as LLD stretching and weight bearing routine    Time 6    Period Weeks    Status New      PT LONG TERM GOAL #3   Title Berg to improve to at least 45 in order to show improved functional balance skills    Time 6    Period Weeks    Status New                    Plan - 08/10/21 1609     Clinical Impression Statement Lander arrives today doing well. He reports his main concern right now is working on improving spasticity in his R LE, as well as trying to improve some balance although this is not as major of a concern for him. Exam does reveal mild  strength and balance deficits as well as gait impairment appropriate for the type of stroke he experienced. Gait limited by circumduction pattern and limited ankle dorsiflexion/limited hip flexion during swing phase, he reports this is almost normal when he is having a good day. I do suspect he may be close to or at his new post-CVA baseline, but we will certainly attempt to address his concerns in PT moving forward.    Personal Factors and Comorbidities Age;Past/Current Experience;Behavior Pattern;Time since onset of injury/illness/exacerbation    Examination-Activity Limitations Locomotion Level;Transfers;Squat;Stairs    Examination-Participation Restrictions Community Activity;Driving    Stability/Clinical Decision Making Evolving/Moderate complexity    Clinical Decision Making Moderate    Rehab Potential Good    PT Frequency 1x / week    PT Duration 6 weeks  PT Treatment/Interventions ADLs/Self Care Home Management;Cryotherapy;Moist Heat;Ultrasound;DME Instruction;Gait training;Stair training;Functional mobility training;Therapeutic activities;Therapeutic exercise;Balance training;Neuromuscular re-education;Patient/family education;Manual techniques;Passive range of motion;Dry needling;Energy conservation;Taping    PT Next Visit Plan needs HEP; work on functional balance, R LE and proximal strength, spasticity management techniques    PT Home Exercise Plan needs at next session    Consulted and Agree with Plan of Care Patient             Patient will benefit from skilled therapeutic intervention in order to improve the following deficits and impairments:  Abnormal gait, Decreased range of motion, Difficulty walking, Impaired tone, Decreased activity tolerance, Decreased balance, Decreased mobility, Decreased strength  Visit Diagnosis: Muscle weakness (generalized)  Other abnormalities of gait and mobility  Unsteadiness on feet     Problem List Patient Active Problem List    Diagnosis Date Noted   Arthritis of right shoulder region    S/P shoulder replacement, 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   Slow transit constipation    Benign prostatic hyperplasia with urinary retention    Spastic hemiparesis of right dominant side (HCC)    Chronic right shoulder pain    Primary osteoarthritis of right shoulder    Left basal ganglia embolic stroke ([place]) 71/16/5790   CVA (cerebral vascular accident) ([place]) 08/31/2018   Elevated aspartate aminotransferase level 06/16/2015   H/O nutritional disorder 06/16/2015   Excessive salivation 06/16/2015   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 [place], [place], [address] Phone: [phone]   Fax:  [phone]  Name: Edward Glass MRN: [id number] Date of Birth: September 21, 1955

## 2021-08-10 NOTE — Therapy (Signed)
[place]. [place], [place], [address] Phone: [phone]   Fax:  [phone]  Occupational Therapy Evaluation  Patient Details  Name: Edward Glass MRN: [id number] Date of Birth: 04/09/1956 Referring Provider (OT): Frann Rider, NP (neurology)   Encounter Date: 08/10/2021   OT End of Session - 08/10/21 1550     Visit Number 1    Number of Visits 7    Date for OT Re-Evaluation 10/09/21    Authorization Type BCBS Medicare ($40 copay/day)    Authorization Time Period VL: [place]    OT Start Time 3151    OT Stop Time 7616    OT Time Calculation (min) 42 min    Activity Tolerance Patient tolerated treatment well    Behavior During Therapy [facility] for tasks assessed/performed            Past Medical History:  Diagnosis Date   Arthritis    right shoulder   Atypical chest pain 08/10/2013   BPH (benign prostatic hyperplasia)    COVID-19 07/2020   CVA (cerebral vascular accident) ([place]) 08/2018   spastic hemiparesis of right dominant side   Dysphagia    Elevated aspartate aminotransferase level 06/16/2015   Excessive salivation 06/16/2015   H/O nutritional disorder 06/16/2015   H/O vitamin D deficiency    History of loop recorder 2020   Hx of adenomatous colonic polyps 2016   Hyperlipidemia    Hypertension    Hypogonadism male 08/21/2014   Leg varices 02/07/2012   Screening for prostate cancer 08/21/2014   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    Past Surgical History:  Procedure Laterality Date   COLONOSCOPY     HERNIA REPAIR  2006   left tricep surgery     LOOP RECORDER INSERTION N/A 09/04/2018   Procedure: LOOP RECORDER INSERTION;  Surgeon: Evans Lance, MD;  Location: [facility];  Service: Cardiovascular;  Laterality: N/A;   TEE WITHOUT CARDIOVERSION N/A 09/04/2018   Procedure: TRANSESOPHAGEAL ECHOCARDIOGRAM (TEE);  Surgeon: Jerline Pain, MD;  Location: [facility] ENDOSCOPY;  Service: Cardiovascular;   Laterality: N/A;   TOTAL SHOULDER ARTHROPLASTY Right 12/16/2020   Procedure: RIGHT TOTAL SHOULDER ARTHROPLASTY;  Surgeon: Meredith Pel, MD;  Location: [place] [facility];  Service: Orthopedics;  Laterality: Right;    There were no vitals filed for this visit.   Subjective Assessment - 08/10/21 1352     Subjective  Pt arrives to session w/ primary concern of decreased RUE AROM s/p prior L MCA infarct in Feb 2020 and R TSA in May 2022. Pt states he received therapy at [facility] after his CVA and PT after his shoulder surgery, but is returning now because he would like to improve use and movement of his R arm. 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 PMH includes arthritis, HTN, HLD, and loop recorder placement in 2020    Patient Stated Goals "Stretch out the shoulder and get it up higher"    Currently in Pain? Yes    Pain Location Back    Pain Orientation Lower;Right    Pain Descriptors / Indicators Sore    Pain Type Acute pain    Pain Onset In the past 7 days  Pain Frequency Intermittent             [facility] OT Assessment - 08/10/21 1325       Assessment   Medical Diagnosis L MCA infarct; L total shoulder arthroplasty    Referring Provider (OT) Frann Rider, NP (neurology)    Onset Date/Surgical Date 08/30/18   R TSA 12/16/20   Hand Dominance Right    Prior Therapy PT after total shoulder (June-Aug 2022); OP OT/PT/ST after CVA (Mar-Aug 2020)      Precautions   Precautions None      Balance Screen   Has the patient fallen in the past 6 months No      Home  Environment   Type of [place] Two level    [place] -quad;Tub bench    Lives With Significant other       Prior Function   Level of Independence Requires assistive device for independence    Scientist, physiological    Leisure Watch TV; playing pool      ADL   ADL comments Reports Mod I w/ all BADLs at this time      IADL   Meal Prep Plans, prepares and serves adequate meals independently    Programmer, applications own vehicle    Medication Management Is responsible for taking medication in correct dosages at correct time      Mobility   Mobility Status Comments Ambulation w/ quad cane      Cognition   Overall Cognitive Status Within Functional Limits for tasks assessed      Observation/Other Assessments   Observations Compensatory R shoulder elevation primarily w/ UE abduction; mild spasticity/tone of RUE and R hand      Sensation   Light Touch Appears Intact    Hot/Cold Appears Intact      Coordination   Gross Motor Movements are Fluid and Coordinated No    Fine Motor Movements are Fluid and Coordinated No   Able to oppose thumb to each finger   Box and Blocks 19 blocks w/ RUE      ROM / Strength   AROM / PROM / Strength AROM;Strength      AROM   AROM Assessment Site Shoulder;Elbow;Forearm    Right/Left Shoulder Right;Left    Right Shoulder Extension 40 Degrees    Right Shoulder Flexion 50 Degrees    Right Shoulder ABduction 62 Degrees    Right Shoulder External Rotation 25 Degrees    Left Shoulder Extension 52 Degrees    Left Shoulder Flexion 139 Degrees    Left Shoulder ABduction 115 Degrees    Left Shoulder External Rotation 38 Degrees    Right/Left Elbow Right;Left    Right Elbow Flexion 130    Right Elbow Extension 31    Left Elbow Flexion 142    Left Elbow Extension 11    Right/Left Forearm Right;Left    Right Forearm Supination 31 Degrees    Left Forearm Supination 76 Degrees      Strength   Strength Assessment Site Shoulder    Right/Left Shoulder Right;Left    Right Shoulder Flexion 2+/5    Right Shoulder Extension 4/5    Right Shoulder  ABduction 2+/5    Right Shoulder Internal Rotation 4-/5    Right Shoulder External Rotation 4-/5    Left Shoulder Flexion 4/5    Left Shoulder Extension 4+/5    Left Shoulder ABduction 4/5    Left Shoulder Internal Rotation  4+/5    Left Shoulder External Rotation 4+/5      Hand Function   Right Hand Grip (lbs) 36    Left Hand Grip (lbs) 50             OT Education - 08/10/21 1539     Education Details Education provided on role and purpose of OT, as well as potential interventions and goals for therapy based on initial evaluation findings.    Person(s) Educated Patient    Methods Explanation    Comprehension Verbalized understanding             OT Short Term Goals - 08/10/21 1600       OT SHORT TERM GOAL #1   Title Pt will demonstrate independence w/ HEP designed for self-stretching and ROM of RUE    Baseline HEP focusing mostly on AROM and strength at this time    Time 3    Period Weeks    Status New    Target Date 09/21/21      OT SHORT TERM GOAL #2   Title Pt will demonstrate RUE abduction w/out R shoulder elevation at least 50% of the time    Baseline Notable shoulder hiking when attempting abduction    Time 3    Period Weeks    Status New             OT Long Term Goals - 08/10/21 1605       OT LONG TERM GOAL #1   Title Pt will improve R shoulder flexion by 15 degrees or more to improve participation in workout exercises and training    Baseline 50 degrees    Time 6    Period Weeks    Status New    Target Date 09/21/21      OT LONG TERM GOAL #2   Title Pt will improve R shoulder abduction by 10 degrees or more with revised body mechanices to improve participation in workout exercises and training    Baseline 62 degrees    Time 6    Period Weeks    Status New    Target Date 09/21/21      OT LONG TERM GOAL #3   Title Pt will demonstrate ability for bilateral mid-level forward reach to place and/or obtain medium weight object (at least 4 lbs)     Baseline Difficulty w/ functional reach    Time 6    Period Weeks    Status New    Target Date 09/21/21             Plan - 08/10/21 1551     Clinical Impression Statement Pt is a 66 y/o male who presents to OP OT due to decreased functional use of RUE s/p cryptogenic L MCA infarct in Feb 2020 and subsequent R TSA in May 2022. PMH includes arthritis, HTN, HLD, and loop recoverd placement in 2020, which has not shown afib thus far. Pt currently lives with his significant other in a townhome and works as a Clinical research associate. Pt will benefit from skilled occupational therapy services to address RUE, emphasizing ROM and typical movement patterns, [place] and coordination, and implementation of an HEP to improve participation in leisure activities and functional use of R, dominant UE.    OT Occupational Profile and History Detailed Assessment- Review of Records and additional review of physical, cognitive, psychosocial history related to current functional performance    Occupational performance deficits (Please refer to evaluation for details): IADL's;Work;Leisure  Body Structure / Function / Physical Skills ADL;Strength;Tone;Pain;GMC;Balance;Body mechanics;UE functional use;IADL;ROM;Coordination;Flexibility;Mobility;Muscle spasms    Rehab Potential Fair    Clinical Decision Making Limited treatment options, no task modification necessary    Comorbidities Affecting Occupational Performance: May have comorbidities impacting occupational performance    Modification or Assistance to Complete Evaluation  No modification of tasks or assist necessary to complete eval    OT Frequency 1x / week    OT Duration 6 weeks    OT Treatment/Interventions Self-care/ADL training;Moist Heat;DME and/or AE instruction;Balance training;Aquatic Therapy;Therapeutic activities;Ultrasound;Therapeutic exercise;Cryotherapy;Neuromuscular education;Functional Mobility Training;Passive range of motion;Patient/family education;Manual  Therapy;Electrical Stimulation    Plan Review POC/goals w/ pt; introduce self-stretching; discuss potential benefit of aquatic therapy?    Recommended Other Services Receiving PT services at this location    Consulted and Agree with Plan of Care Patient            Patient will benefit from skilled therapeutic intervention in order to improve the following deficits and impairments:   Body Structure / Function / Physical Skills: ADL, Strength, Tone, Pain, GMC, Balance, Body mechanics, UE functional use, IADL, ROM, Coordination, Flexibility, Mobility, Muscle spasms   Visit Diagnosis: Stiffness of right shoulder, not elsewhere classified  Spastic hemiplegia of right dominant side as late effect of cerebral infarction [facility])  Other lack of coordination  Muscle weakness (generalized)   Problem List Patient Active Problem List   Diagnosis Date Noted   Arthritis of right shoulder region    S/P shoulder replacement, 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   Slow transit constipation    Benign prostatic hyperplasia with urinary retention    Spastic hemiparesis of right dominant side (HCC)    Chronic right shoulder pain    Primary osteoarthritis of right shoulder    Left basal ganglia embolic stroke ([place]) 98/33/8250   CVA (cerebral vascular accident) ([place]) 08/31/2018   Elevated aspartate aminotransferase level 06/16/2015   H/O nutritional disorder 06/16/2015   Excessive salivation 06/16/2015   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 [place], [place], [address] Phone: [phone]   Fax:  [phone]  Name: Edward Glass MRN: [id number] Date of Birth: 03/13/1956

## 2021-08-12 NOTE — Progress Notes (Signed)
[facility] Summary Report / Loop Recorder 

## 2021-08-17 ENCOUNTER — Other Ambulatory Visit: Payer: Self-pay

## 2021-08-17 ENCOUNTER — Encounter: Payer: Medicare Other | Admitting: Physical Therapy

## 2021-08-17 ENCOUNTER — Ambulatory Visit (HOSPITAL_COMMUNITY): Payer: Medicare Other | Attending: Internal Medicine

## 2021-08-17 ENCOUNTER — Ambulatory Visit: Payer: Medicare Other | Admitting: Occupational Therapy

## 2021-08-17 DIAGNOSIS — R0609 Other forms of dyspnea: Secondary | ICD-10-CM | POA: Diagnosis present

## 2021-08-17 LAB — ECHOCARDIOGRAM COMPLETE
Area-P 1/2: 3.42 cm2
S' Lateral: 2.8 cm

## 2021-08-18 ENCOUNTER — Other Ambulatory Visit: Payer: Self-pay | Admitting: Adult Health

## 2021-08-24 ENCOUNTER — Encounter: Payer: Medicare Other | Admitting: Physical Therapy

## 2021-08-24 ENCOUNTER — Encounter: Payer: Medicare Other | Admitting: Occupational Therapy

## 2021-08-31 ENCOUNTER — Encounter: Payer: Medicare Other | Admitting: Occupational Therapy

## 2021-08-31 ENCOUNTER — Encounter: Payer: Medicare Other | Admitting: Physical Therapy

## 2021-09-05 LAB — CUP PACEART REMOTE DEVICE CHECK
Date Time Interrogation Session: 20230210232214
Implantable Pulse Generator Implant Date: 20200210

## 2021-09-07 ENCOUNTER — Encounter: Payer: Medicare Other | Admitting: Occupational Therapy

## 2021-09-07 ENCOUNTER — Ambulatory Visit (INDEPENDENT_AMBULATORY_CARE_PROVIDER_SITE_OTHER): Payer: Medicare Other

## 2021-09-07 ENCOUNTER — Encounter: Payer: Medicare Other | Admitting: Physical Therapy

## 2021-09-07 DIAGNOSIS — I63512 Cerebral infarction due to unspecified occlusion or stenosis of left middle cerebral artery: Secondary | ICD-10-CM

## 2021-09-11 NOTE — Progress Notes (Signed)
[facility] Summary Report / Loop Recorder 

## 2021-09-14 ENCOUNTER — Encounter: Payer: Medicare Other | Admitting: Physical Therapy

## 2021-09-14 ENCOUNTER — Ambulatory Visit: Payer: Medicare Other | Admitting: Occupational Therapy

## 2021-09-21 ENCOUNTER — Encounter: Payer: Medicare Other | Admitting: Occupational Therapy

## 2021-09-21 ENCOUNTER — Encounter: Payer: Medicare Other | Admitting: Physical Therapy

## 2021-09-25 IMAGING — CT CT SHOULDER*R* W/O CM
2 of 3 series · 14 of 27 positions shown, 18 images · non-contrast
Comparison: Radiographs 01/24/2020

CLINICAL DATA: Chronic right shoulder pain. Preop for total
shoulder arthroplasty.

EXAM:
CT OF THE UPPER RIGHT EXTREMITY WITHOUT CONTRAST
TECHNIQUE: Multidetector CT imaging of the upper right extremity was performed
according to the standard protocol.

[Series 10: shoulder 2.00 br40 s3 sag soft · sagittal · 0.44mm/px · 5 of 115 slices shown, 6 images]
[im 39/115  bone]
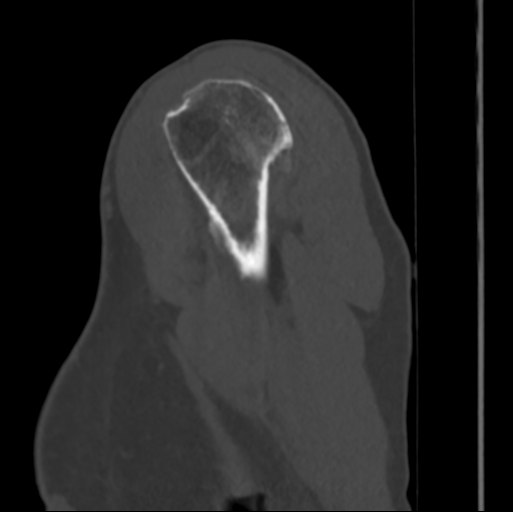
[im 48/115  bone]
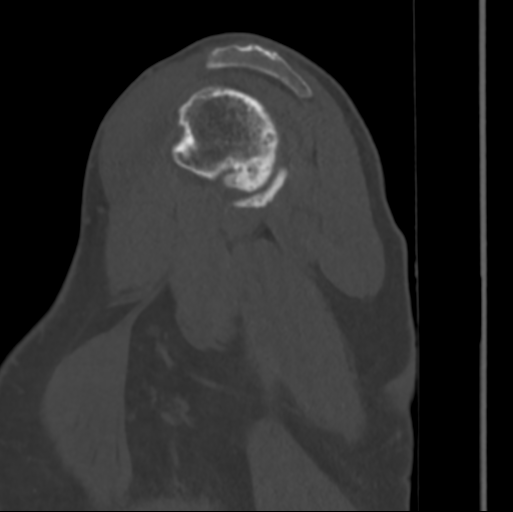
[im 58/115  soft-tissue]
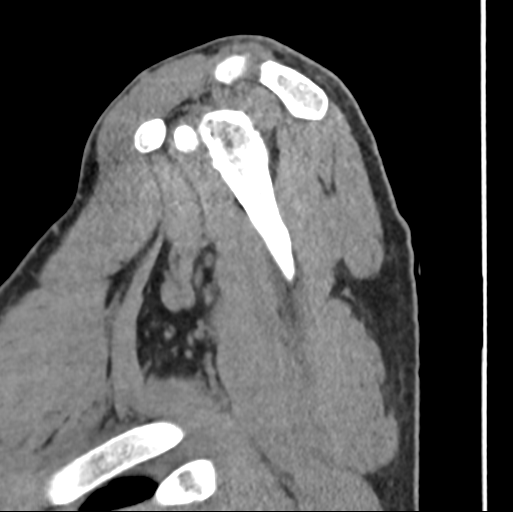
[im 58/115  bone]
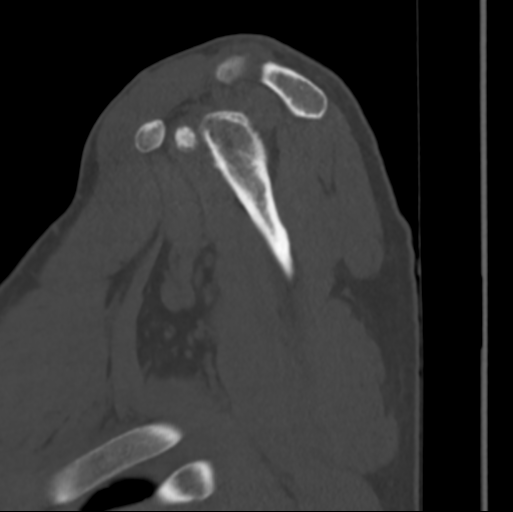
[im 67/115  bone]
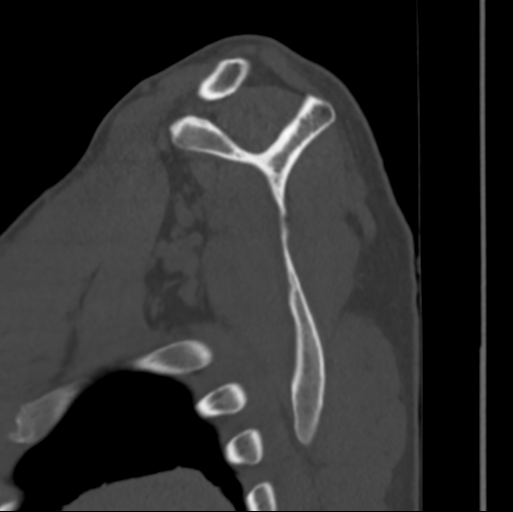
[im 77/115  bone]
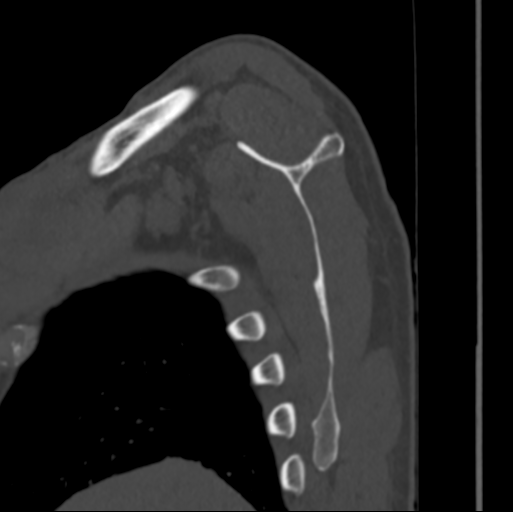

[Series 14: shoulder 0.60 br40 s3 thins soft · axial · 0.45mm/px · z∈[-1018,-840]mm · 9 of 374 slices shown, 12 images]
[im 38/374  soft-tissue]
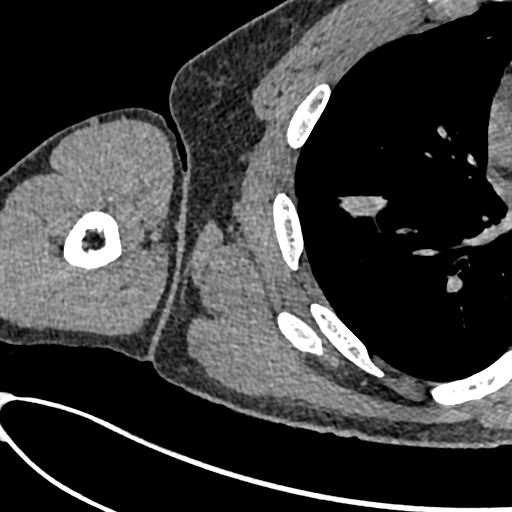
[im 38/374  bone]
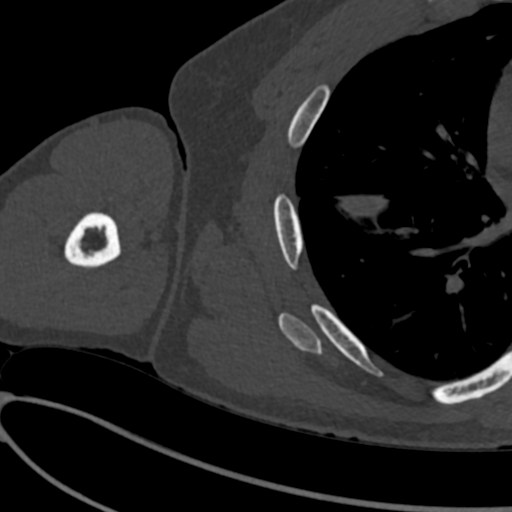
[im 75/374  bone]
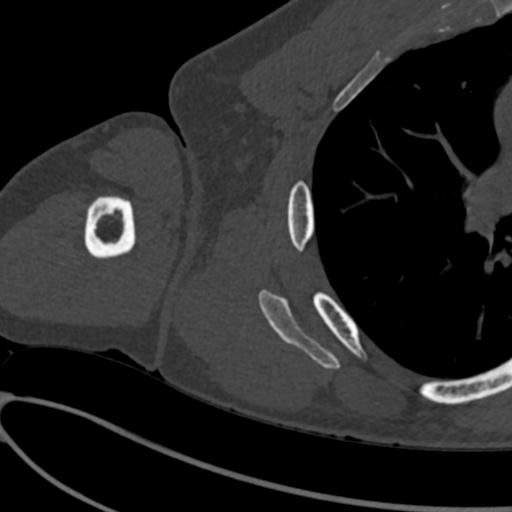
[im 112/374  bone]
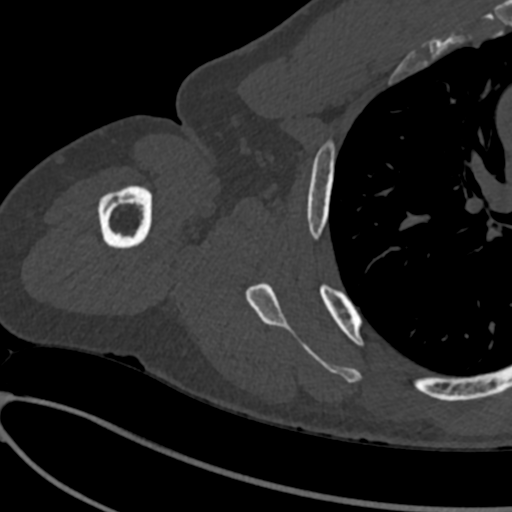
[im 150/374  bone]
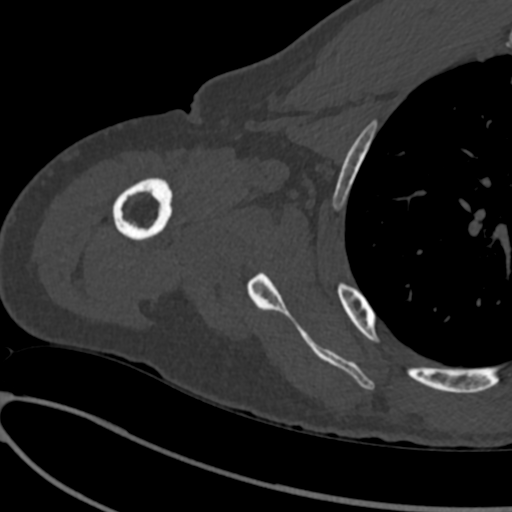
[im 187/374  soft-tissue]
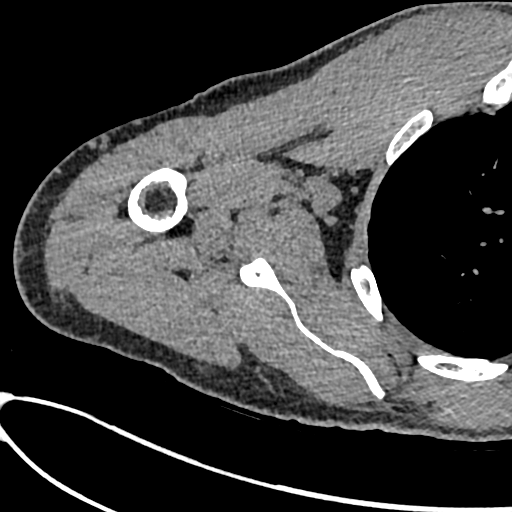
[im 187/374  bone]
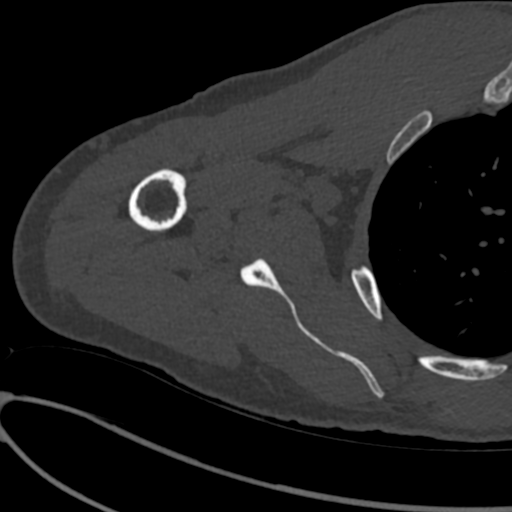
[im 224/374  bone]
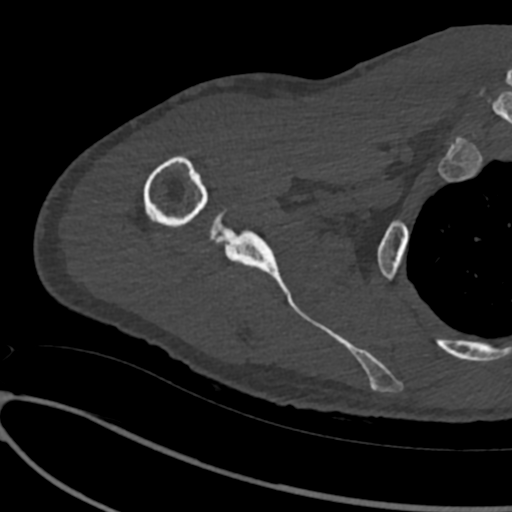
[im 262/374  bone]
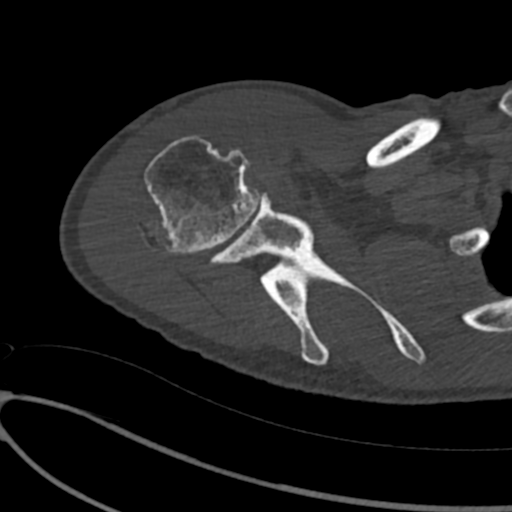
[im 299/374  bone]
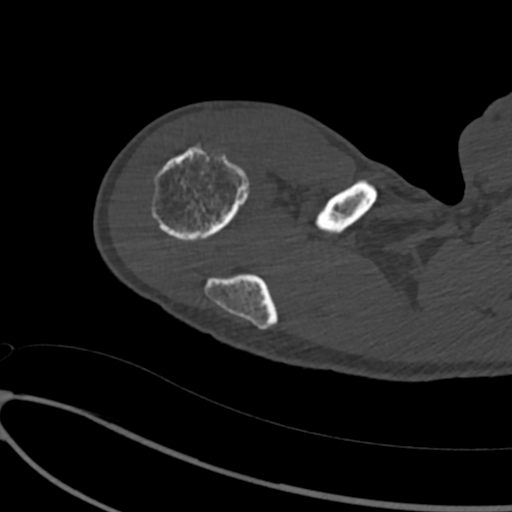
[im 336/374  soft-tissue]
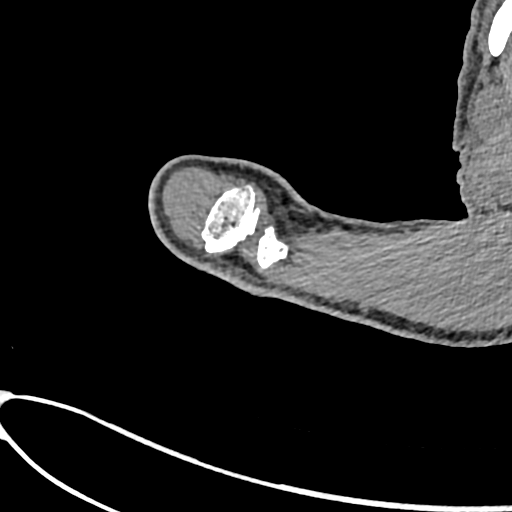
[im 336/374  bone]
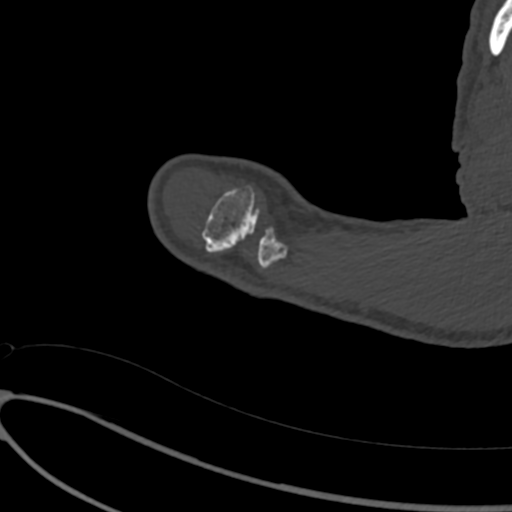

[14 of 27 positions shown; findings below may reference images not displayed]

FINDINGS: Significant/advanced degenerative changes involving the right
glenohumeral joint with marked joint space narrowing, osteophytic
spurring, bony eburnation and subchondral cystic change. Slight
widening of the glenoid but no significant thinning. No fracture or
AVN. There is a 10 mm ossified loose body in the anterior joint
space or subcoracoid bursa.

The AC joint is intact. The acromion is type 2 in shape. No lateral
downsloping subacromial spurring.

The visualized right ribs are intact and the visualized scapula is
intact. The visualized right lung is grossly clear. No worrisome
pulmonary lesions.

Grossly by CT the rotator cuff tendons are intact. No findings
suspicious for a full-thickness retracted tear. No fatty atrophy of
the rotator cuff muscles.
IMPRESSION: 1. Significant/advanced degenerative changes involving the right
glenohumeral joint as detailed above.
2. Grossly by CT the rotator cuff tendons are intact. No fatty
atrophy of the rotator cuff muscles.

## 2021-10-12 ENCOUNTER — Ambulatory Visit (INDEPENDENT_AMBULATORY_CARE_PROVIDER_SITE_OTHER): Payer: Medicare Other

## 2021-10-12 DIAGNOSIS — I63512 Cerebral infarction due to unspecified occlusion or stenosis of left middle cerebral artery: Secondary | ICD-10-CM

## 2021-10-13 LAB — CUP PACEART REMOTE DEVICE CHECK
Date Time Interrogation Session: 20230319230933
Implantable Pulse Generator Implant Date: 20200210

## 2021-10-22 NOTE — Progress Notes (Signed)
[facility] Summary Report / Loop Recorder 

## 2021-10-28 NOTE — Progress Notes (Signed)
? ? ?Electrophysiology Office Note ?Date: 11/02/2021 ? ?ID:  Edward Glass, DOB [birth year], MRN [id number] ? ?PCP: Katherina Mires, MD ?Primary Cardiologist: None ?Electrophysiologist: Cristopher Peru, MD  ? ?CC: ILR follow-up ? ?Edward Glass is a 66 y.o. male seen today for Dr. Lovena Le . he presents today for routine electrophysiology followup.  Since last being seen in our clinic, the patient reports doing very well.  he denies chest pain, palpitations, dyspnea, PND, orthopnea, nausea, vomiting, dizziness, syncope, edema, weight gain, or early satiety. ? 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 ?Past Medical History:  ?Diagnosis Date  ? Arthritis   ? right shoulder  ? Atypical chest pain 08/10/2013  ? BPH (benign prostatic hyperplasia)   ? COVID-19 07/2020  ? CVA (cerebral vascular accident) ([place]) 08/2018  ? spastic hemiparesis of right dominant side  ? Dysphagia   ? Elevated aspartate aminotransferase level 06/16/2015  ? Excessive salivation 06/16/2015  ? H/O nutritional disorder 06/16/2015  ? H/O vitamin D deficiency   ? History of loop recorder 2020  ? Hx of adenomatous colonic polyps 2016  ? Hyperlipidemia   ? Hypertension   ? Hypogonadism male 08/21/2014  ? 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 Screening for prostate cancer 08/21/2014  ? 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 ?Past Surgical History:  ?Procedure Laterality Date  ? COLONOSCOPY    ? HERNIA REPAIR  2006  ? left tricep surgery    ? LOOP RECORDER INSERTION N/A 09/04/2018  ? Procedure: LOOP RECORDER INSERTION;  Surgeon: Evans Lance, MD;  Location: [place] [facility];  Service: Cardiovascular;  Laterality: N/A;  ? TEE WITHOUT CARDIOVERSION N/A 09/04/2018  ? Procedure: TRANSESOPHAGEAL ECHOCARDIOGRAM (TEE);  Surgeon: Jerline Pain, MD;  Location: [facility]OS[facility];  Service: Cardiovascular;  Laterality: N/A;  ? TOTAL SHOULDER ARTHROPLASTY Right 12/16/2020  ? Procedure: RIGHT TOTAL SHOULDER ARTHROPLASTY;  Surgeon: Meredith Pel, MD;  Location: [place];  Service: Orthopedics;  Laterality: Right;  ? ? ?Current Outpatient Medications  ?Medication Sig Dispense Refill  ? acetaminophen (TYLENOL) 325 MG tablet Take 2 tablets (650 mg total) by mouth every 4 (four) hours as needed for mild pain (or temp > 37.5 C (99.5 F)).    ? albuterol (VENTOLIN HFA) 108 (90 Base) MCG/ACT inhaler as needed for shortness of breath.    ? amLODipine (NORVASC) 5 MG tablet Take 5 mg by mouth daily.    ? Baclofen 5 MG TABS TAKE 1 TABLET BY MOUTH THREE TIMES A DAY 270 tablet 1  ? clopidogrel (PLAVIX) 75 MG tablet TAKE 1 TABLET BY MOUTH EVERY DAY 90 tablet 1  ? diclofenac Sodium (VOLTAREN) 1 % GEL Apply topically in the morning and at bedtime.    ? multivitamin (ONE-A-DAY MEN'S) TABS tablet Take 1 tablet by mouth daily.    ? rosuvastatin (CRESTOR) 20 MG tablet TAKE 1 TABLET BY MOUTH EVERY DAY 90 tablet 3  ? ?No current facility-administered medications for this visit.  ? ? ?Allergies:   Patient has no known allergies.  ? ?Social History: ?Social History  ? ?Socioeconomic History  ? Marital status: Divorced  ?  Spouse name: Not on file  ? Number of children: 2  ? Years of education: Not on file  ? Highest education level: Not on file  ?Occupational History  ? Occupation: Physiological scientist  ?Tobacco Use  ? Smoking status: Never  ? Smokeless tobacco: Never  ?Vaping Use  ? Vaping Use: Never used  ?Substance  and Sexual Activity  ? Alcohol use: No  ? Drug use: No  ? Sexual activity: Not on file  ?Other Topics Concern  ? Not on file  ?Social History Narrative  ? Not on file  ? ?Social Determinants of Health  ? ?Financial Resource Strain: Not on file  ?Food Insecurity: Not on file  ?Transportation Needs: Not on file  ?Physical Activity: Not on file  ?Stress: Not on file  ?Social Connections: Not on file  ?Intimate Partner Violence: Not on file  ? ? ?Family History: ?Family History  ?Problem Relation Age of Onset  ? Kidney failure Mother   ? Cancer Mother   ? Prostate cancer  Father   ? Cancer Father   ? Diabetes Father   ? Colon cancer Neg Hx   ? Esophageal cancer Neg Hx   ? Rectal cancer Neg Hx   ? Stomach cancer Neg Hx   ? ? ? ?Review of Systems: ?All other systems reviewed and are otherwise negative except as noted above. ? ?Physical Exam: ?Vitals:  ? 11/02/21 0840  ?BP: 122/62  ?Pulse: 80  ?SpO2: 97%  ?Weight: 184 lb 12.8 oz (83.8 kg)  ?Height: '5\' 5"'$  (1.651 m)  ?  ? ?GEN- The patient is well appearing, alert and oriented x 3 today.   ?HEENT: normocephalic, atraumatic; sclera clear, conjunctiva pink; hearing intact; oropharynx clear; neck supple  ?Lungs- Clear to ausculation bilaterally, normal work of breathing.  No wheezes, rales, rhonchi ?Heart- Regular rate and rhythm, no murmurs, rubs or gallops  ?GI- soft, non-tender, non-distended, bowel sounds present  ?Extremities- no clubbing, cyanosis, or edema  ?MS- no significant deformity or atrophy ?Skin- warm and dry, no rash or lesion; PPM pocket well healed ?Psych- euthymic mood, full affect ?Neuro- strength and sensation are intact ? ?PPM Interrogation- reviewed in detail today,  See PACEART report ? ?EKG:  EKG is not ordered today. ? ?Recent Labs: ?07/29/2021: BUN 24; Creatinine, Ser 0.72; Hemoglobin 15.7; Platelets 335; Potassium 4.5; Sodium 139; TSH 3.630  ? ?Wt Readings from Last 3 Encounters:  ?11/02/21 184 lb 12.8 oz (83.8 kg)  ?07/29/21 185 lb (83.9 kg)  ?07/23/21 182 lb (82.6 kg)  ?  ? ?Other studies Reviewed: ?Additional studies/ records that were reviewed today include: Previous EP office notes, Previous remote checks, Most recent labwork.  ? ?Assessment and Plan: ? ?1. Cryptogenic Stroke s/p Medtronic Loop recorder ?Normal device function ?See Claudia Desanctis Art report ?No changes today ? ?2. HLD ?Continue statin ? ?3. Decreased exercise tolerance ?Echo 07/2021 LVEF 60-65% ?More likely related muscle deficits/spasticity referenced in PT notes ?Symptom episodes NSR/Sinus Tach ? ?Current medicines are reviewed at length with the  patient today.   ?The patient does not have concerns regarding his medicines.  The following changes were made today:  none ? ? ?Disposition:   Follow up with Dr. Lovena Le in 6 Months  ? ? ?Signed, ?Shirley Friar, PA-C  ?11/02/2021 ?8:55 AM ? ?[facility] ?[address] ?[address] ?[place] [address] ?[phone] (office) ?[phone] (fax) ? ?

## 2021-10-29 ENCOUNTER — Other Ambulatory Visit: Payer: Self-pay | Admitting: Adult Health

## 2021-11-02 ENCOUNTER — Encounter: Payer: Self-pay | Admitting: Student

## 2021-11-02 ENCOUNTER — Ambulatory Visit (INDEPENDENT_AMBULATORY_CARE_PROVIDER_SITE_OTHER): Payer: Medicare Other | Admitting: Student

## 2021-11-02 VITALS — BP 122/62 | HR 80 | Ht 65.0 in | Wt 184.8 lb

## 2021-11-02 DIAGNOSIS — R0609 Other forms of dyspnea: Secondary | ICD-10-CM | POA: Diagnosis not present

## 2021-11-02 DIAGNOSIS — I639 Cerebral infarction, unspecified: Secondary | ICD-10-CM | POA: Diagnosis not present

## 2021-11-02 LAB — CUP PACEART INCLINIC DEVICE CHECK
Date Time Interrogation Session: 20230410090114
Implantable Pulse Generator Implant Date: 20200210

## 2021-11-02 NOTE — Patient Instructions (Signed)
Medication Instructions:  ?Your physician recommends that you continue on your current medications as directed. Please refer to the Current Medication list given to you today. ? ?*If you need a refill on your cardiac medications before your next appointment, please call your pharmacy* ? ? ?Lab Work: ?None ?If you have labs (blood work) drawn today and your tests are completely normal, you will receive your results only by: ?MyChart Message (if you have MyChart) OR ?A paper copy in the mail ?If you have any lab test that is abnormal or we need to change your treatment, we will call you to review the results. ? ? ?Follow-Up: ?At [facility], you and your health needs are our priority.  As part of our continuing mission to provide you with exceptional heart care, we have created designated Provider Care Teams.  These Care Teams include your primary Cardiologist (physician) and Advanced Practice Providers (APPs -  Physician Assistants and Nurse Practitioners) who all work together to provide you with the care you need, when you need it. ? ?We recommend signing up for the patient portal called "MyChart".  Sign up information is provided on this After Visit Summary.  MyChart is used to connect with patients for Virtual Visits (Telemedicine).  Patients are able to view lab/test results, encounter notes, upcoming appointments, etc.  Non-urgent messages can be sent to your provider as well.   ?To learn more about what you can do with MyChart, go to [email or web address].   ? ?Your next appointment:   ?6 month(s) ? ?The format for your next appointment:   ?In Person ? ?Provider:   ?Cristopher Peru, MD  ? ?Important Information About Sugar ? ? ? ? ?  ?

## 2021-11-16 ENCOUNTER — Ambulatory Visit (INDEPENDENT_AMBULATORY_CARE_PROVIDER_SITE_OTHER): Payer: Medicare Other

## 2021-11-16 DIAGNOSIS — I639 Cerebral infarction, unspecified: Secondary | ICD-10-CM | POA: Diagnosis not present

## 2021-11-16 LAB — CUP PACEART REMOTE DEVICE CHECK
Date Time Interrogation Session: 20230421230957
Implantable Pulse Generator Implant Date: 20200210

## 2021-12-02 NOTE — Progress Notes (Signed)
[facility] Summary Report / Loop Recorder 

## 2021-12-21 LAB — CUP PACEART REMOTE DEVICE CHECK
Date Time Interrogation Session: 20230524235232
Implantable Pulse Generator Implant Date: 20200210

## 2021-12-22 ENCOUNTER — Ambulatory Visit (INDEPENDENT_AMBULATORY_CARE_PROVIDER_SITE_OTHER): Payer: Medicare Other

## 2021-12-22 DIAGNOSIS — I639 Cerebral infarction, unspecified: Secondary | ICD-10-CM | POA: Diagnosis not present

## 2021-12-30 ENCOUNTER — Telehealth: Payer: Self-pay | Admitting: Adult Health

## 2021-12-30 NOTE — Telephone Encounter (Signed)
Noted! Thank you

## 2021-12-30 NOTE — Telephone Encounter (Signed)
Pt called stating that he thinks he has had another stroke and would like a call back from the RN or Provider. Pt was advised to go to the ER because we are not an emergency clinic. Pt stated "Well I felt the same symptoms of having a stroke a couple of days ago." I informed the pt that I would send the message to the RN but if he starts feeling worse he is advised to head to the ER because I could not promise a specific time that he would get a call back. Please advise.

## 2021-12-30 NOTE — Telephone Encounter (Signed)
I returned pt's call. He sts 3 weeks ago he was at home and had onset of localized right arm and leg weakness the last for a few minutes. Denied having headache, blurry vision, or worsening speech at the time of this event.   He called today wanting an MRI ordered to determine if he had another stroke. I advised since we are outpatient office and it's been close to 6 months we would need a visit to get imaging approved ( of note most recent insurance on file is medicare). I advised in the event of any new onset weakness, pounding headaches, blurry vision, or speech changes the best place to be evaluated is the ER since onsite imaging can be completed and interventions as necessary.   He reports today he has been feeling fine and back to his baseline. Pt was due to come in on 01/21/2022 for f/u and I have moved him up to 01/04/22 at 115 pm. Pt was advised if he started to experience similar symptoms he should seek care immediately at the ER. He was agreeable to this plan.

## 2021-12-31 ENCOUNTER — Encounter: Payer: Medicare Other | Attending: Physical Medicine & Rehabilitation | Admitting: Physical Medicine & Rehabilitation

## 2021-12-31 ENCOUNTER — Encounter: Payer: Self-pay | Admitting: Physical Medicine & Rehabilitation

## 2021-12-31 VITALS — BP 126/85 | HR 60 | Ht 65.0 in | Wt 181.0 lb

## 2021-12-31 DIAGNOSIS — G8111 Spastic hemiplegia affecting right dominant side: Secondary | ICD-10-CM | POA: Diagnosis present

## 2021-12-31 NOTE — Progress Notes (Signed)
Subjective:    Patient ID: Edward Glass, male    DOB: [birth year], 67 y.o.   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  He had resultant right spastic hemiplegia.  He went through inpatient rehabilitation at [facility].  He is followed at the outpatient physical medicine rehab clinic.  He did complete outpatient therapy as well. He was receiving botulinum toxin injections right upper extremity to help with upper extremity spasticity.  He feels like his spasticity is a little bit better overall.  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  He did get improvement of his pain however his shoulder range of motion remained very limited. Pain Inventory Average Pain 7 Pain Right Now 8 My pain is constant, dull, and tight  LOCATION OF PAIN  Right arm & Right leg  BOWEL Number of stools per week: 7   BLADDER Normal  Frequent urination Yes     Mobility walk with assistance use a cane use a walker how many minutes can you walk? unknown ability to climb steps?  yes do you drive?  yes Do you have any goals in this area?  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  yes  Neuro/Psych weakness numbness trouble walking spasms  Prior Studies Any changes since last visit?  yes x-rays  Physicians involved in your care Any changes since last visit?  yes, Dr. Marlou Sa - Ortho   Family History  Problem Relation Age of Onset   Kidney failure Mother    Cancer Mother    Prostate cancer Father    Cancer Father    Diabetes Father    Colon cancer Neg Hx    Esophageal cancer Neg  Hx    Rectal cancer Neg Hx    Stomach cancer Neg Hx    Social History   Socioeconomic History   Marital status: Divorced    Spouse name: Not on file   Number of children: 2   Years of education: Not on file   Highest education level: Not on file  Occupational History   Occupation: Physiological scientist  Tobacco Use   Smoking status: Never   Smokeless tobacco: Never  Vaping Use   Vaping Use: Never used  Substance and Sexual Activity   Alcohol use: No   Drug use: No   Sexual activity: Not on file  Other Topics Concern   Not on file  Social History Narrative   Not on file   Social Determinants of Health   Financial Resource Strain: Not on file  Food Insecurity: Not on file  Transportation Needs: Not on file  Physical Activity: Not on file  Stress: Not on file  Social Connections: Not on file   Past Surgical History:  Procedure Laterality Date   COLONOSCOPY     HERNIA REPAIR  2006   left tricep surgery     LOOP RECORDER INSERTION N/A 09/04/2018   Procedure: LOOP RECORDER INSERTION;  Surgeon: Evans Lance, MD;  Location: [place] [facility];  Service: Cardiovascular;  Laterality: N/A;   TEE WITHOUT CARDIOVERSION N/A 09/04/2018   Procedure: TRANSESOPHAGEAL  ECHOCARDIOGRAM (TEE);  Surgeon: Jerline Pain, MD;  Location: [facility] ENDOSCOPY;  Service: Cardiovascular;  Laterality: N/A;   TOTAL SHOULDER ARTHROPLASTY Right 12/16/2020   Procedure: RIGHT TOTAL SHOULDER ARTHROPLASTY;  Surgeon: Meredith Pel, MD;  Location: [place];  Service: Orthopedics;  Laterality: Right;   Past Medical History:  Diagnosis Date   Arthritis    right shoulder   Atypical chest pain 08/10/2013   BPH (benign prostatic hyperplasia)    COVID-19 07/2020   CVA (cerebral vascular accident) ([place]) 08/2018   spastic hemiparesis of right dominant side   Dysphagia    Elevated aspartate aminotransferase level 06/16/2015   Excessive salivation 06/16/2015   H/O nutritional disorder 06/16/2015   H/O vitamin D  deficiency    History of loop recorder 2020   Hx of adenomatous colonic polyps 2016   Hyperlipidemia    Hypertension    Hypogonadism male 08/21/2014   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   Screening for prostate cancer 08/21/2014   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   BP 126/85   Pulse 60   Ht '5\' 5"'$  (1.651 m)   Wt 181 lb (82.1 kg)   SpO2 95%   BMI 30.12 kg/m   Opioid Risk Score:   Fall Risk Score:  `1  Depression screen [facility] 2/9     12/31/2021   11:34 AM 11/08/2018    9:23 AM 10/10/2018    1:41 PM  Depression screen PHQ 2/9  Decreased Interest 1 0 1  Down, Depressed, Hopeless '1 1 2  '$ PHQ - 2 Score '2 1 3  '$ Altered sleeping   0  Tired, decreased energy   0  Change in appetite   0  Feeling bad or failure about yourself    1  Trouble concentrating   0  Moving slowly or fidgety/restless   0  Suicidal thoughts   0  PHQ-9 Score   4  Difficult doing work/chores   Not difficult at all    Review of Systems  Respiratory:  Positive for shortness of breath.   Genitourinary:        Frequent voiding  Musculoskeletal:  Positive for gait problem.       Right arm & right leg pain  Neurological:  Positive for weakness and numbness.       Spasms  All other systems reviewed and are negative.     Objective:   Physical Exam Vitals and nursing note reviewed.  Constitutional:      Appearance: He is obese.  HENT:     Head: Normocephalic and atraumatic.  Eyes:     Extraocular Movements: Extraocular movements intact.     Conjunctiva/sclera: Conjunctivae normal.     Pupils: Pupils are equal, round, and reactive to light.  Skin:    General: Skin is warm and dry.  Neurological:     Mental Status: He is alert and oriented to person, place, and time.     Comments: Motor strength is 3 - at the deltoid 3 at the bicep on the right 3 - at the tricep 3 at the finger flexors and extensors. Patient does have finger to thumb opposition on the right side with each finger although this is slow compared with the  left side.  Reduced supination right upper extremity. Ambulates with a cane no evidence of toe drag or knee instability Speech without dysarthria or aphasia No evidence of field cut Sensation is equal bilateral upper limbs to light touch.  Psychiatric:        Mood  and Affect: Mood normal.        Behavior: Behavior normal.           Assessment & Plan:  #1.  Left subcortical infarct with chronic right spastic hemiplegia.  Overall he has made a good recovery and is modified independent from a functional standpoint he still has poor motor control right upper extremity.  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  Not sure if the right shoulder contracture will disqualify him but we should know more after the OT eval I will see him back in approximately 6 weeks we discussed the process including referral to surgery if OT eval determines that he would be a good candidate from their standpoint.

## 2021-12-31 NOTE — Patient Instructions (Signed)
Vivistim eval with OT is next step and if you qualify then we will refer you to Neurosurgeon

## 2022-01-04 ENCOUNTER — Ambulatory Visit: Payer: Medicare Other | Admitting: Adult Health

## 2022-01-04 ENCOUNTER — Encounter: Payer: Self-pay | Admitting: Adult Health

## 2022-01-04 VITALS — BP 135/79 | HR 68 | Ht 65.0 in | Wt 182.0 lb

## 2022-01-04 DIAGNOSIS — Z9189 Other specified personal risk factors, not elsewhere classified: Secondary | ICD-10-CM

## 2022-01-04 DIAGNOSIS — G8111 Spastic hemiplegia affecting right dominant side: Secondary | ICD-10-CM

## 2022-01-04 DIAGNOSIS — Z8673 Personal history of transient ischemic attack (TIA), and cerebral infarction without residual deficits: Secondary | ICD-10-CM

## 2022-01-04 DIAGNOSIS — R29818 Other symptoms and signs involving the nervous system: Secondary | ICD-10-CM | POA: Diagnosis not present

## 2022-01-04 NOTE — Patient Instructions (Signed)
Continue clopidogrel 75 mg daily  and Crestor  for secondary stroke prevention  We will check lab work today  You will be called to schedule a brain MRI to ensure no new stroke  Continue to follow with Dr. Letta Pate as advised   Continue to follow up with PCP regarding cholesterol and blood pressure management  Maintain strict control of hypertension with blood pressure goal below 130/90 and cholesterol with LDL cholesterol (bad cholesterol) goal below 70 mg/dL.   Signs of a Stroke? Follow the BEFAST method:  Balance Watch for a sudden loss of balance, trouble with coordination or vertigo Eyes Is there a sudden loss of vision in one or both eyes? Or double vision?  Face: Ask the person to smile. Does one side of the face droop or is it numb?  Arms: Ask the person to raise both arms. Does one arm drift downward? Is there weakness or numbness of a leg? Speech: Ask the person to repeat a simple phrase. Does the speech sound slurred/strange? Is the person confused ? Time: If you observe any of these signs, call 911.      Followup in the future with me in 4 months or call earlier if needed       Thank you for coming to see [place] at [facility]. I hope we have been able to provide you high quality care today.  You may receive a patient satisfaction survey over the next few weeks. We would appreciate your feedback and comments so that we may continue to improve ourselves and the health of our patients.

## 2022-01-04 NOTE — Progress Notes (Signed)
[facility] [address] [place]. [place] [address] [phone]       STROKE FOLLOW UP NOTE  Edward Glass Date of Birth:  Oct 20, 1955 Medical Record Number:  [id number]   Reason for Referral: Cryptogenic stroke follow up Edward Glass: Dr. Leonie Glass PCP: Edward Mires, MD     CHIEF COMPLAINT:  Chief Complaint  Patient presents with   Follow-up    RM 2 Alone Pt is well, states the R arm and leg weakness has improved some since he called office      HPI:  Update 01/04/2022 JM: Patient returns for acute visit after calling on 6/7 reporting worsening right arm and leg weakness lasting a few minutes prior to returning to baseline which occurred approx 3 weeks ago and requesting an MRI brain to rule out new stroke.  He did not seek any emergent evaluation at time or onset.  Denies any other associated symptoms such as headache, visual changes, dizziness or cognitive changes.  Reports since that time, he has had fluctuation of right leg heaviness sensation typically towards the end of the day or with increased activity.  He also notes fluctuation of speech over the past month.  Discussed concern of worsening spasticity at prior visit, referred to PT/OT for which he was evaluated for but did not have any additional sessions as advised.  He believes his spasticity has continued to worsen. He was seen by Dr. Letta Glass 6/8 who referred him to OT for eval of Vivistim for continued RUE deficits.  Was seen by PCP 6/7 and restarted on Prozac for worsening depression.  Reports compliance on Plavix and Crestor, denies side effects.  Blood pressure today 135/79.  Loop recorder has not shown atrial fibrillation thus far. He does note day time fatigue, not feeling refreshed upon awakening and having nocturia approx 5-6x per night.  Denies snoring, witnessed apneas or morning headaches.  No further concerns at this time.      History provided for reference purposes only Update  07/23/2021 JM: patient returns for 6 months stroke follow up. C/o right sided spasticity with slight increase in spasticity at times over the past couple of months, continues on baclofen 5 mg 3 times daily. Use of quad cane - no recent falls. Interested in doing additional therapy if able.  Dysarthria stable. No new stroke symptoms.  Compliant on Plavix and Crestor.  Blood pressure today 142/84.  Loop recorder has not shown atrial fibrillation thus far.  No further concerns.  Update 01/15/2021 JM: Edward Glass returns for 32-monthstroke follow-up unaccompanied.  He has been stable from stroke standpoint without new stroke/TIA symptoms and residual right sided spasticity and dysarthria.  Continues to use quad cane and denies any recent falls.  Use of baclofen 5 mg 3 times daily with benefit.  Compliant on Plavix without associated side effects.  He is concerned regarding possible statin myalgias as he will experience [name] pain with cramping shortly after taking atorvastatin.  Blood pressure today 137/81.  Loop recorder has not shown atrial fibrillation thus far.  Underwent right total shoulder arthroplasty 12/16/2020 by Dr. DMarlou Sawithout complication and recently started therapy.  No new concerns at this time.  Update 07/15/2020 JM: Edward. RWimerreturns for 632-monthtroke follow-up unaccompanied. 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 Has been using baclofen for spasticity '5mg'$  three times daily but does experience increased stiffness upon awakening and towards the end of the day. Feels as though speech has been improving. Denies new  or worsening stroke/TIA symptoms. At prior visit, complained of right shoulder pain worsened post stroke. Evaluated by orthopedics with note personally reviewed and x-ray revealing severe end-stage OA with progression compared to prior imaging in 08/2018.  He has since received 2 injections with benefit.  Remains on Plavix and atorvastatin without side effects for  secondary stroke prevention. Blood pressure today 127/77. Loop recorder has not shown atrial fibrillation thus far.  Previously on Prozac for depression/anxiety post stroke but he has since been able to discontinue this without difficulty.  No further concerns at this time.  Update 01/10/2020 JM: Edward Glass returns for follow-up regarding stroke in 08/2018.  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  Denies new stroke/TIA symptoms.  Currently ambulating with a quad cane and denies any recent falls.  Reports typically caring cane at his side "just in case" but he is here to walk without it.  Initiated baclofen 5 mg twice daily as needed for spasticity.  Difficulty tolerating daytime dose due to increased fatigue has continued 5 mg dosage at night with only mild benefit.  He questions benefit of restarting Botox as he only received 2 rounds.  He does report right shoulder pain which is chronic but worsened post stroke.  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  Continues on clopidogrel and atorvastatin for secondary stroke prevention.  Prior lipid panel on 10/09/2019 by PCP showed LDL 51.  Blood pressure today 122/77.  Loop recorder is not shown atrial fibrillation thus far.  No further concerns at this time.  Update 09/06/2019 JM: Edward Glass is a 66 year old male who is being seen today for cryptogenic stroke follow-up.  He was previously seen via virtual visit on 11/08/2018 and has not previously followed up as recommended.  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  He continues to ambulate with a Rollator walker and denies any recent falls.  Greatest concern today is including continued right-sided  spasticity.  Trial of Botox injections but denies benefit as well as trial of Zanaflex but patient self discontinued due to reported side effect of upset stomach.  Completed 3 months DAPT and continues on Plavix alone without bleeding or bruising.  Continues on atorvastatin without myalgias.  Blood pressure today 116/78.  Continues to follow with PCP regularly for HTN and HLD management/monitoring.  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  Loop recorder has not shown atrial fibrillation thus far.  Denies new or worsening stroke/TIA symptoms.  Initial visit via virtual 11/08/2018: He has been stable from a stroke standpoint with residual deficits of aphasia, cognitive deficits and right hemiparesis but does endorse improvement.  He continues to participate at neuro rehab PT/OT/ST along with continuing exercises at home.  He is currently ambulating with a quad cane and denies any recent falls.  He does need some assistance with bathing and dressing but continues to be able to do more on his own each day.  He no longer has any difficulties with swallowing and is currently on a regular diet.  He does endorse increased salivation.  He continues on aspirin and Plavix without side effects of bleeding or bruising.  Continues on atorvastatin without side effects myalgias.  Blood  pressures not routinely monitored at home and encourage daughter and patient to obtain cough to start monitoring.  He does endorse occasional depression difficulties such as feeling down or increased anxiety.  It was recommended to initiate Prozac during hospital admission but states once he went home, he did not continue as he felt it was not needed.  Per review of loop recorder, no report of atrial fibrillation found but per patient and daughter, they were contacted by cardiology stating arrhythmia had been found and is in the process of scheduling visit with cardiology.  No further concerns at this time.   Denies new or worsening stroke/TIA symptoms.  Stroke admission 08/30/2018: Edward Glass is a 66 year old male who presented to [facility] ED with right-sided weakness and aphasia.   CT head reviewed which showed age indeterminate small vessel ischemia in the left BG.  CTA showed possible left M1 occlusion versus severe stenosis.  CT perfusion no infarct for but large penumbra.  MRI  brain reviewed and showed left MCA patchy infarcts mostly concentrated in the left BG. He arrived greater than 24h from time since last well, therefore no acute stroke interventions done.  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  He unfortunately had neurological worsening with severe aphasia and dense right hemiplegia with extension of deficits on 09/01/2018.  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   When pts dtrs arrive, they both explain that in 2016 after tricep repair surgery they noted a possible arrhythmia and placed him on holter monitor for 30d, but was unrevealing.  2D echo showed an EF of 55 to 60%.  TCD bubble study negative for PFO.  Lower extremity venous Dopplers negative for DVT.  TEE showed small PFO/bubble crossover noted during Valsalva which was not felt to be clinically significant along with no evidence of cardiac thrombus therefore loop recorder placed.   Recommended DAPT for 3 months then Plavix alone due to severe left M1 stenosis.  HTN stable recommended long-term BP goal 1 30-1 50 due left M1 stenosis.  HLD 104 and initiated atorvastatin 40 mg daily.  Other stroke risk factors include family history of stroke but no personal history of stroke.  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  Initiated Prozac on 08/31/2018.  He had residual deficits of right  hemiparesis, dysarthria and mixed aphasia and was discharged to [facility] for ongoing therapy.      ROS:   14 system review of systems performed and negative with exception of those listed in HPI  PMH:  Past Medical History:  Diagnosis Date   Arthritis    right shoulder   Atypical chest pain 08/10/2013   BPH (benign prostatic hyperplasia)    COVID-19 07/2020   CVA (cerebral vascular accident) ([place]) 08/2018   spastic hemiparesis of right dominant side   Dysphagia    Elevated aspartate aminotransferase level 06/16/2015   Excessive salivation 06/16/2015   H/O nutritional disorder 06/16/2015   H/O vitamin D deficiency    History of loop recorder 2020   Hx of adenomatous colonic polyps 2016   Hyperlipidemia    Hypertension    Hypogonadism male 08/21/2014   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/07/2012   Screening for prostate cancer 08/21/2014   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    PSH:  Past Surgical History:  Procedure  Laterality Date   COLONOSCOPY     HERNIA REPAIR  2006   left tricep surgery     LOOP RECORDER INSERTION N/A 09/04/2018   Procedure: LOOP RECORDER INSERTION;  Surgeon: Evans Lance, MD;  Location: [facility];  Service: Cardiovascular;  Laterality: N/A;   TEE WITHOUT CARDIOVERSION N/A 09/04/2018   Procedure: TRANSESOPHAGEAL ECHOCARDIOGRAM (TEE);  Surgeon: Jerline Pain, MD;  Location: [facility];  Service: Cardiovascular;  Laterality: N/A;   TOTAL SHOULDER ARTHROPLASTY Right 12/16/2020   Procedure: RIGHT TOTAL SHOULDER ARTHROPLASTY;  Surgeon: Meredith Pel, MD;  Location: [facility];  Service: Orthopedics;  Laterality: Right;    Social History:  Social History   Socioeconomic History   Marital status: Divorced    Spouse name: Not on file   Number of children: 2   Years of education: Not on file   Highest education level: Not on file  Occupational History   Occupation: Physiological scientist  Tobacco Use   Smoking status: Never   Smokeless tobacco: Never  Vaping Use    Vaping Use: Never used  Substance and Sexual Activity   Alcohol use: No   Drug use: No   Sexual activity: Not on file  Other Topics Concern   Not on file  Social History Narrative   Not on file   Social Determinants of Health   Financial Resource Strain: Low Risk  (09/05/2018)   Overall Financial Resource Strain (CARDIA)    Difficulty of Paying Living Expenses: Not hard at all  Food Insecurity: No Food Insecurity (09/05/2018)   Hunger Vital Sign    Worried About Running Out of Food in the Last Year: Never true    [place] in the Last Year: Never true  Transportation Needs: No Transportation Needs (09/05/2018)   PRAPARE - Hydrologist (Medical): No    Lack of Transportation (Non-Medical): No  Physical Activity: Sufficiently Active (09/05/2018)   Exercise Vital Sign    Days of Exercise per Week: 6 days    Minutes of Exercise per Session: 30 min  Stress: No Stress Concern Present (09/05/2018)   [place]    Feeling of Stress : Not at all  Social Connections: Unknown (09/05/2018)   Social Connection and Isolation Panel [NHANES]    Frequency of Communication with Friends and Family: Three times a week    Frequency of Social Gatherings with Friends and Family: Three times a week    Attends Religious Services: Not on file    Active Member of Clubs or Organizations: Yes    Attends Club or Organization Meetings: 1 to 4 times per year    Marital Status: Not on file  Intimate Partner Violence: Not At Risk (09/05/2018)   Humiliation, Afraid, Rape, and Kick questionnaire    Fear of Current or Ex-Partner: No    Emotionally Abused: No    Physically Abused: No    Sexually Abused: No    Family History:  Family History  Problem Relation Age of Onset   Kidney failure Mother    Cancer Mother    Prostate cancer Father    Cancer Father    Diabetes Father    Colon cancer Neg Hx    Esophageal  cancer Neg Hx    Rectal cancer Neg Hx    Stomach cancer Neg Hx     Medications:   Current Outpatient Medications on File Prior to Visit  Medication Sig Dispense Refill   acetaminophen (TYLENOL) 325 MG tablet Take 2 tablets (650 mg total) by mouth every 4 (four) hours as needed for mild pain (or temp > 37.5 C (99.5 F)).     albuterol (VENTOLIN HFA) 108 (90 Base) MCG/ACT inhaler as needed for shortness of breath.     amLODipine (NORVASC) 5 MG tablet Take 5 mg by mouth daily.     Baclofen 5 MG TABS TAKE 1 TABLET BY MOUTH THREE TIMES A DAY 270 tablet 1   clopidogrel (PLAVIX) 75 MG tablet TAKE 1 TABLET BY MOUTH EVERY DAY 90 tablet 1   diclofenac Sodium (VOLTAREN) 1 % GEL Apply topically in the morning and at bedtime.     FLUoxetine (PROZAC) 20 MG capsule Take by mouth.     multivitamin (ONE-A-DAY MEN'S) TABS tablet Take 1 tablet by mouth daily.     rosuvastatin (CRESTOR) 20 MG tablet TAKE 1 TABLET BY MOUTH EVERY DAY 90 tablet 3   No current facility-administered medications on file prior to visit.    Allergies:  No Known Allergies   Physical Exam  Vitals:   01/04/22 1312  BP: 135/79  Pulse: 68  Weight: 182 lb (82.6 kg)  Height: '5\' 5"'$  (1.651 m)   Body mass index is 30.29 kg/m. No results found.   General: well developed, well nourished, pleasant middle-aged male, seated, in no evident distress Head: head normocephalic and atraumatic.   Neck: supple with no carotid or supraclavicular bruits Cardiovascular: regular rate and rhythm, no murmurs Musculoskeletal: limited right shoulder ROM Skin:  no rash/petichiae Vascular:  Normal pulses all extremities   Neurologic Exam Mental Status: Awake and fully alert. Mild dysarthria with hypophonia. Oriented to place and time. Recent and remote memory intact. Attention span, concentration and fund of knowledge appropriate. Mood and affect appropriate.  Cranial Nerves: Pupils equal, briskly reactive to light. Extraocular movements full  without nystagmus. Visual fields full to confrontation. Hearing intact. Facial sensation intact. Mild right lower facial weakness when smiling.  tongue, palate moves normally and symmetrically.  Motor: Full strength left upper and lower extremity, very slight right arm and leg weakness distally compared to left side with increased tone arm>leg Sensory.: intact to touch , pinprick , position and vibratory sensation.  Coordination: Rapid alternating movements normal on left side. Finger-to-nose and heel-to-shin performed accurately on left side.  Unable to adequately assess right side due to increased tone Gait and Station: Arises from chair without difficulty. Stance is normal. Gait demonstrates  slow hemiplegic gait and stiffened right leg with use of cane.  Tandem walking heel toe not attempted Reflexes: 2+ RUE and [name] and 1+ left side. Toes downgoing.        ASSESSMENT/PLAN: Edward Glass is a 66 y.o. year old male here with left MCA patchy infarct secondary to left M1 occlusion with embolic pattern due to unclear etiology on 08/30/2018 and neurological worsening with extension of deficits on 09/01/2018.  Loop recorder placed which has not shown atrial fibrillation thus far.  Vascular risk factors include intracranial stenosis, HLD and HTN.  Residual stroke deficits of right upper and lower spasticity and dysarthria with ongoing improvement      worsening of right-sided deficits Very slight weakness right sided distally with subjective fluctuation of right-sided weakness and dysarthria.  Obtain MRI brain to rule out new stroke although lower suspicion. Suspect fluctuation of symptoms in setting of fatigue or after increased exertion Obtain lipid panel, A1c, CBC and CMP Educated on importance  of calling 911 immediately with any new onset strokelike symptoms or worsening of prior stroke deficits for emergent evaluation.  He verbalized understanding. Cryptogenic left MCA infarct:  Residual RUE  and [name] spasticity, gait impairment and dysarthria.  Continue to follow with Dr. Letta Glass PMR as scheduled Continue clopidogrel 75 mg daily  and Crestor for secondary stroke prevention.  Loop recorder negative for atrial fibrillation thus far -continuously monitored by cardiology Discussed secondary stroke prevention measures and importance of close PCP follow-up for aggressive stroke risk factor management including BP goal<130/90, HLD with LDL goal<70 and DM with A1c.<7  Lipid panel 03/2021: LDL 67  At risk for sleep apnea: Referral placed to [place] sleep [facility]..  Endorses daytime fatigue, nonrestorative sleep and frequent nocturia.  History of prior stroke and current treatment for HTN.  Discussed formal sleep evaluation and likely needed proceeding with sleep study and discussion regarding potential treatment with CPAP if indicated.  He verbalized understanding and agreed with referral     Follow up in 4 months or call earlier if needed   CC: Edward Mires, MD  I spent 36 minutes of face-to-face and non-face-to-face time with patient.  This included previsit chart review, lab review, study review, order entry, electronic health record documentation, patient education and discussion regarding worsening symptoms 4 weeks ago with fluctuation since that time, possible etiologies and further evaluation, prior stroke and residual deficits, secondary stroke prevention measures and aggressive stroke risk factor management and answered all other questions to patient satisfaction   Frann Rider, AGNP-BC  [facility] [address] [place], [place] [address]  Phone [phone] Fax [phone] Note: This document was prepared with digital dictation and possible smart phrase technology. Any transcriptional errors that result from this process are unintentional.

## 2022-01-05 LAB — COMPREHENSIVE METABOLIC PANEL
ALT: 32 IU/L (ref 0–44)
AST: 39 IU/L (ref 0–40)
Albumin/Globulin Ratio: 1.7 (ref 1.2–2.2)
Albumin: 4.4 g/dL (ref 3.8–4.8)
Alkaline Phosphatase: 77 IU/L (ref 44–121)
BUN/Creatinine Ratio: 24 (ref 10–24)
BUN: 23 mg/dL (ref 8–27)
Bilirubin Total: <0.2 mg/dL (ref 0.0–1.2)
CO2: 23 mmol/L (ref 20–29)
Calcium: 9.7 mg/dL (ref 8.6–10.2)
Chloride: 102 mmol/L (ref 96–106)
Creatinine, Ser: 0.94 mg/dL (ref 0.76–1.27)
Globulin, Total: 2.6 g/dL (ref 1.5–4.5)
Glucose: 87 mg/dL (ref 70–99)
Potassium: 4.7 mmol/L (ref 3.5–5.2)
Sodium: 140 mmol/L (ref 134–144)
Total Protein: 7 g/dL (ref 6.0–8.5)
eGFR: 89 mL/min/{1.73_m2} (ref >59)

## 2022-01-05 LAB — CBC WITH DIFFERENTIAL/PLATELET
Basophils Absolute: 0 10*3/uL (ref 0.0–0.2)
Basos: 1 %
EOS (ABSOLUTE): 0.1 10*3/uL (ref 0.0–0.4)
Eos: 1 %
Hematocrit: 44.7 % (ref 37.5–51.0)
Hemoglobin: 15 g/dL (ref 13.0–17.7)
Immature Grans (Abs): 0 10*3/uL (ref 0.0–0.1)
Immature Granulocytes: 0 %
Lymphocytes Absolute: 2.2 10*3/uL (ref 0.7–3.1)
Lymphs: 33 %
MCH: 29.1 pg (ref 26.6–33.0)
MCHC: 33.6 g/dL (ref 31.5–35.7)
MCV: 87 fL (ref 79–97)
Monocytes Absolute: 0.5 10*3/uL (ref 0.1–0.9)
Monocytes: 8 %
Neutrophils Absolute: 3.7 10*3/uL (ref 1.4–7.0)
Neutrophils: 57 %
Platelets: 277 10*3/uL (ref 150–450)
RBC: 5.15 x10E6/uL (ref 4.14–5.80)
RDW: 13.5 % (ref 11.6–15.4)
WBC: 6.6 10*3/uL (ref 3.4–10.8)

## 2022-01-05 LAB — HEMOGLOBIN A1C
Est. average glucose Bld gHb Est-mCnc: 126 mg/dL
Hgb A1c MFr Bld: 6 % — ABNORMAL HIGH (ref 4.8–5.6)

## 2022-01-05 LAB — LIPID PANEL
Chol/HDL Ratio: 2.3 ratio (ref 0.0–5.0)
Cholesterol, Total: 126 mg/dL (ref 100–199)
HDL: 55 mg/dL (ref >39)
LDL Chol Calc (NIH): 53 mg/dL (ref 0–99)
Triglycerides: 99 mg/dL (ref 0–149)
VLDL Cholesterol Cal: 18 mg/dL (ref 5–40)

## 2022-01-05 NOTE — Progress Notes (Signed)
[facility] Summary Report / Loop Recorder 

## 2022-01-06 ENCOUNTER — Ambulatory Visit: Payer: Medicare Other | Admitting: Internal Medicine

## 2022-01-06 ENCOUNTER — Encounter: Payer: Self-pay | Admitting: Internal Medicine

## 2022-01-06 VITALS — BP 106/70 | HR 68 | Temp 98.2°F

## 2022-01-06 DIAGNOSIS — R0602 Shortness of breath: Secondary | ICD-10-CM | POA: Diagnosis not present

## 2022-01-06 DIAGNOSIS — Z8709 Personal history of other diseases of the respiratory system: Secondary | ICD-10-CM | POA: Diagnosis not present

## 2022-01-06 NOTE — Progress Notes (Signed)
Edward Glass    [id number]    1956-04-07  Primary Care Physician:Briscoe, Jannifer Rodney, MD  Referring Physician: Katherina Mires, MD [address],  [place] [address] Reason for Consultation: shortness of breath Date of Consultation: 01/06/2022  Chief complaint:   Chief Complaint  Patient presents with   Consult    Sob that comes and goes, cannot pinpoint anything that causes it.  History of asthma as a child.     HPI: Edward Glass is a 66 y.o. here for shortness of breath. Had covid a few months ago and was not treated with anything at that time. Since then has been short of breath. Denies coughing. He does wheeze sometimes. Denies chest pain. Dyspnea usually comes when he is at rest. He does cardio multiple times a week and can push through. He uses albuterol inhaler less than once/week. Not sure if it's helping. Has occasional palpitations which  could be related to shortness of breath. Denies nocturnal awakening.   He feels the wheezing has gotten over the last week.   Denies recurrent bronchitis or pneumonia. Has not been prescribed any steroids for breathing in the last year.   Had a stroke 3 years ago. Has residual weakness on his right side and change in voice quality - more hoarse now.   Had childhood asthma but outgrew it.    Social history:  Occupation: works as a Physiological scientist.  Exposures: lives at home independently, also with girlfriend  Smoking history: never smoked, had passive smoke exposure as a child.   Social History   Occupational History   Occupation: Physiological scientist  Tobacco Use   Smoking status: Never   Smokeless tobacco: Never  Vaping Use   Vaping Use: Never used  Substance and Sexual Activity   Alcohol use: No   Drug use: No   Sexual activity: Not on file    Relevant family history:  Family History  Problem Relation Age of Onset   Kidney failure Mother    Cancer Mother    Prostate cancer Father     Cancer Father    Diabetes Father    Colon cancer Neg Hx    Esophageal cancer Neg Hx    Rectal cancer Neg Hx    Stomach cancer Neg Hx     Past Medical History:  Diagnosis Date   Arthritis    right shoulder   Atypical chest pain 08/10/2013   BPH (benign prostatic hyperplasia)    COVID-19 07/2020   CVA (cerebral vascular accident) ([place]) 08/2018   spastic hemiparesis of right dominant side   Dysphagia    Elevated aspartate aminotransferase level 06/16/2015   Excessive salivation 06/16/2015   H/O nutritional disorder 06/16/2015   H/O vitamin D deficiency    History of loop recorder 2020   Hx of adenomatous colonic polyps 2016   Hyperlipidemia    Hypertension    Hypogonadism male 08/21/2014   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   Screening for prostate cancer 08/21/2014   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    Past Surgical History:  Procedure Laterality Date   COLONOSCOPY     HERNIA REPAIR  2006   left tricep surgery     LOOP RECORDER INSERTION N/A 09/04/2018   Procedure: LOOP RECORDER INSERTION;  Surgeon: Evans Lance, MD;  Location: [facility];  Service: Cardiovascular;  Laterality: N/A;   TEE WITHOUT CARDIOVERSION N/A 09/04/2018   Procedure: TRANSESOPHAGEAL ECHOCARDIOGRAM (  TEE);  Surgeon: Jerline Pain, MD;  Location: [facility] ENDOSCOPY;  Service: Cardiovascular;  Laterality: N/A;   TOTAL SHOULDER ARTHROPLASTY Right 12/16/2020   Procedure: RIGHT TOTAL SHOULDER ARTHROPLASTY;  Surgeon: Meredith Pel, MD;  Location: [facility];  Service: Orthopedics;  Laterality: Right;     Physical Exam: Blood pressure 106/70, pulse 68, temperature 98.2 F (36.8 C), temperature source Oral, SpO2 97 %. Gen:      No acute distress ENT:  no nasal polyps, mucus membranes moist Lungs:    No increased respiratory effort, symmetric chest wall excursion, clear to auscultation bilaterally, no wheezes or crackles CV:         Regular rate and rhythm; no murmurs, rubs, or gallops.  No pedal edema Abd:       + bowel sounds; soft, non-tender; no distension MSK: no acute synovitis of DIP or PIP joints, no mechanics hands.  Skin:      Warm and dry; no rashes Neuro: normal speech, right sided hemiparesis, ambulates with cane Psych: alert and oriented x3, flat affect   Data Reviewed/Medical Decision Making:  Independent interpretation of tests: Imaging:  Review of patient's chest xray images Sept 2022 revealed no acute process. The patient's images have been independently reviewed by me.    PFTs: I have personally reviewed the patient's PFTs and      No data to display          Labs:  Lab Results  Component Value Date   WBC 6.6 01/04/2022   HGB 15.0 01/04/2022   HCT 44.7 01/04/2022   MCV 87 01/04/2022   PLT 277 01/04/2022   Lab Results  Component Value Date   NA 140 01/04/2022   K 4.7 01/04/2022   CL 102 01/04/2022   CO2 23 01/04/2022     Immunization status:  Immunization History  Administered Date(s) Administered   Fluad Quad(high Dose 65+) 04/07/2021   Influenza Split 05/10/2012   Influenza,inj,Quad PF,6+ Mos 05/09/2013, 05/08/2014   PFIZER(Purple Top)SARS-COV-2 Vaccination 09/29/2019, 10/20/2019   Pneumococcal Conjugate-13 04/01/2020   Pneumococcal Polysaccharide-23 04/07/2021   Td 06/17/2003   Tdap 01/17/2013   Zoster Recombinat (Shingrix) 11/02/2017, 04/20/2018   Zoster, Live 02/20/2015     I reviewed prior external note(s) from PCP, PMR, neurology  I reviewed the result(s) of the labs and imaging as noted above.   I have ordered pft  Assessment:  Shortness of breath History of CVA   Plan/Recommendations: Very vague symptoms. Patient is a limited historian. Unsuual presentation for symptoms at rest only and not with exertion. Continue trial of prn albuterol Will obtain PFTs and I'll see him back after this   We discussed disease management and progression at length today.      Return to Care: Return in about 3 weeks (around  01/27/2022).  Lenice Llamas, MD [facility] [place]  CC: Katherina Mires, MD

## 2022-01-06 NOTE — Patient Instructions (Addendum)
Please schedule follow up scheduled with myself in 3-4 weeks.  If my schedule is not open yet, we will contact you with a reminder closer to that time. Please call [phone] if you haven't heard from [place] a month before.   Before your next visit I would like you to have: Full set of PFTs -1 hour, appointment with me after.   Take your albuterol as needed. Pay attention if it improves your breathing and wheezing or not.

## 2022-01-12 ENCOUNTER — Telehealth: Payer: Self-pay | Admitting: Adult Health

## 2022-01-12 NOTE — Telephone Encounter (Signed)
BCBS medicare Edward Glass: [id number] exp. 01/12/22-02/10/22 sent to GI

## 2022-01-21 ENCOUNTER — Ambulatory Visit: Payer: Medicare Other | Admitting: Adult Health

## 2022-01-21 ENCOUNTER — Ambulatory Visit: Payer: Medicare Other | Attending: Physical Medicine & Rehabilitation | Admitting: Occupational Therapy

## 2022-01-21 DIAGNOSIS — I69351 Hemiplegia and hemiparesis following cerebral infarction affecting right dominant side: Secondary | ICD-10-CM | POA: Insufficient documentation

## 2022-01-21 NOTE — Therapy (Signed)
OUTPATIENT OCCUPATIONAL THERAPY VIVISTIM EVALUATION  Patient Name: Edward Glass MRN: [id number] DOB:1956-05-20, 66 y.o., male Today's Date: 01/21/2022  PCP: Katherina Mires, MD REFERRING PROVIDER: [name], MD   OT End of Session - 01/21/22 1635     Visit Number 1    Authorization Type BCBS Medicare ($40 copay/day)    Authorization Time Period VL: [place]    OT Start Time 8295    OT Stop Time 1600    OT Time Calculation (min) 30 min    Activity Tolerance Patient tolerated treatment well    Behavior During Therapy [facility] for tasks assessed/performed             Past Medical History:  Diagnosis Date   Arthritis    right shoulder   Atypical chest pain 08/10/2013   BPH (benign prostatic hyperplasia)    COVID-19 07/2020   CVA (cerebral vascular accident) ([place]) 08/2018   spastic hemiparesis of right dominant side   Dysphagia    Elevated aspartate aminotransferase level 06/16/2015   Excessive salivation 06/16/2015   H/O nutritional disorder 06/16/2015   H/O vitamin D deficiency    History of loop recorder 2020   Hx of adenomatous colonic polyps 2016   Hyperlipidemia    Hypertension    Hypogonadism male 08/21/2014   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   Screening for prostate cancer 08/21/2014   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   Past Surgical History:  Procedure Laterality Date   COLONOSCOPY     HERNIA REPAIR  2006   left tricep surgery     LOOP RECORDER INSERTION N/A 09/04/2018   Procedure: LOOP RECORDER INSERTION;  Surgeon: Evans Lance, MD;  Location: [facility];  Service: Cardiovascular;  Laterality: N/A;   TEE WITHOUT CARDIOVERSION N/A 09/04/2018   Procedure: TRANSESOPHAGEAL ECHOCARDIOGRAM (TEE);  Surgeon: Jerline Pain, MD;  Location[facility] ENDOSCOPY;  Service: Cardiovascular;  Laterality: N/A;   TOTAL SHOULDER ARTHROPLASTY Right 12/16/2020   Procedure: RIGHT TOTAL SHOULDER ARTHROPLASTY;  Surgeon: Meredith Pel, MD;  Location: [facility];  Service: Orthopedics;   Laterality: Right;   Patient Active Problem List   Diagnosis Date Noted   Arthritis of right shoulder region    S/P shoulder replacement, 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   Slow transit constipation    Benign prostatic hyperplasia with urinary retention    Spastic hemiparesis of right dominant side (HCC)    Chronic right shoulder pain    Primary osteoarthritis of right shoulder    Left basal ganglia embolic stroke ([place]) 62/13/0865   CVA (cerebral vascular accident) ([place]) 08/31/2018   Elevated aspartate aminotransferase level 06/16/2015   H/O nutritional disorder 06/16/2015   Excessive salivation 06/16/2015   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   Heart palpitations 06/16/2015   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   Hypogonadism male 08/21/2014   Screening for prostate cancer 08/21/2014   Atypical chest pain 08/10/2013   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 PMH includes arthritis, HTN, HLD, and loop recorder placement in 2020   PRECAUTIONS: Fall and Other:  loop recorder  WEIGHT BEARING RESTRICTIONS No  PAIN:  Are you having pain? No  FALLS: Has patient fallen in last 6 months? No  LIVING ENVIRONMENT: Lives with: lives with their partner, 2 kids (grown - do not live with patient) Lives in: House/apartment Stairs:  stairs inside and outside, tub/shower combo Has following equipment at home: Programmer, multimedia and shower chair  PLOF: Independent, Vocation/Vocational requirements: Teacher, adult education, and Leisure: lift weights and shoot pool  PATIENT GOALS Get this arm moving and shooting pool  OBJECTIVE:   HAND DOMINANCE: Right  SENSATION: WFL - feels normal per pt report  MUSCLE TONE: RUE: Moderate  COGNITION: Overall cognitive status: Within functional limits for tasks assessed  PERCEPTION: WFL  PRAXIS: WFL   PATIENT EDUCATION: Education details: Education on role and purpose of OT, Education on Vivistim Person educated: Patient Education method: Explanation Education comprehension: verbalized understanding  --------------------------------------------------------------------------------------------------------------------- Bubba Camp: IADL Scale  A. Ability to Use Telephone  Operates telephone on own initiative-looks up and dials numbers, etc. - 1  B. Shopping Takes care of all shopping needs independently - 1  C. Food Preparation Plans, prepares and serves adequate meals independently - 1  D. Housekeeping Performs light daily tasks such as dish washing, bed making - 1  E. Laundry  Does Doctor, hospital - 1  F. Mode of Transportation  Travels independently on public transportation or drives own car - 1  G. Responsibility for Own Medications Takes responsibility if medication is prepared in advance in separate dosage - 0  H. Ability to [employer] financial matters independently (budgets, writes checks, pays rent, bills, goes to [employer]), collects and keeps track of income - 1    TOTAL LAWTON BRODY SCORE: 8  -----------------------------------------------------------  Ron Parker Index of Independence in ADL INDEPENDENCE (1) DEPENDENCE (0)  NO supervision, direction or personal assistance WITH supervision, direction, personal assistance or total care   ACTIVITIES 1 or 0  Bathing 1  Dressing 1  Toileting 1  Transferring 1  Continence 1  Feeding 1    KATZ TOTAL:  6  -----------------------------------------------------------  FUGL-MEYER ASSESSMENT   A. Upper Extremity  I. Reflex Activity: Flexors (elbow) 2  Extensors (triceps) 2  Total 4/4     II. Volitional Movement within Synergies: Flexor Synergy:   Shoulder Retraction 1  Shoulder Elevation 1  Shoulder Abduction 1  External Rotation 1  Elbow Flexion 2  Forearm Supination 1  Extensor Synergy:   Shoulder Adduction/Internal Rotation 1  Elbow Extension 1  Forearm Pronation 2  Total 11/18   III. Volitional Mixing of Synergies: Hand to Lumbar Spine 1  Shoulder Flexion 0  Pronation/Supination 1  Total 2/6   IV. Volitional Movement with Little to No Synergy Shoulder Abduction 0  Shoulder Flexion 90+ 0  Pronation/Supination 1  Total 1/6    B. Wrist Stability of Wrist at 15* 2  Repeated Flex/Ext 1  Circumduction 2  Stability at 15*Wrist, 0*Elbow 1  Flexion/Extension - Elbow 0* 1  Total 7/10   C. Hand Mass Flexion 2  Mass Extension 2  Total 4/4    D. Grasp Hook  2  Thumb Adduction - paper 2  Pincer - pen 2  Cylindrical - cup 2  Spherical - ball 2  Total 10/10   E. Coordination/Speed Tremor 2  Dysmetria 2  Time 0  Total 4/6    FUGL-MEYER ASSESSMENT: UPPER EXTREMITY A-E TOTAL: 43/64    ---------------------------------------------------------------------------------------------------------------------    ASSESSMENT:  CLINICAL IMPRESSION:  Patient is a 66 yr old male referred to OT for assessment to determine qualification for implantable nerve stimulator to address RUE functioning.  Completed Fugl Meyer UE assessment, as well as Ron Parker index of Independence in ADL, and Bubba Camp IADL Scale.  Patient has adequate active movement, sensation, cognition, and motivation throughout RUE with limitations in elbow extension, elbow flexion, shoulder retraction, shoulder abduction, external rotation, elbow extension, wrist movement, and supination .  Patient has  excellent family support and has been involved in intense rehab programs in the past.  Patient is motivated to improve functional use and movement in his RUE arm.  PERFORMANCE DEFICITS in functional skills including ADLs, IADLs, coordination, dexterity, ROM, FMC, GMC, decreased knowledge of use of DME, and UE functional use  IMPAIRMENTS are limiting patient from ADLs, IADLs, work, and leisure.   COMORBIDITIES may have co-morbidities  that affects occupational performance. Patient will benefit from skilled OT to address above impairments and improve overall function.  MODIFICATION OR ASSISTANCE TO COMPLETE EVALUATION: Min-Moderate modification of tasks or assist with assess necessary to complete an evaluation.  OT OCCUPATIONAL PROFILE AND HISTORY: Detailed assessment: Review of records and additional review of physical, cognitive, psychosocial history related to current functional performance.  CLINICAL DECISION MAKING: Moderate - several treatment options, min-mod task modification necessary  REHAB POTENTIAL: Excellent  EVALUATION COMPLEXITY: Moderate  PLAN:  Will determine appropriateness of candidacy for Vivistim and reevaluate and develop plan upon return if determined appropriate.    Zachery Conch, OT 01/21/2022, 4:36 PM

## 2022-01-25 ENCOUNTER — Ambulatory Visit (INDEPENDENT_AMBULATORY_CARE_PROVIDER_SITE_OTHER): Payer: Medicare Other

## 2022-01-25 DIAGNOSIS — I639 Cerebral infarction, unspecified: Secondary | ICD-10-CM | POA: Diagnosis not present

## 2022-01-26 LAB — CUP PACEART REMOTE DEVICE CHECK
Date Time Interrogation Session: 20230702232353
Implantable Pulse Generator Implant Date: 20200210

## 2022-01-27 ENCOUNTER — Ambulatory Visit
Admission: RE | Admit: 2022-01-27 | Discharge: 2022-01-27 | Disposition: A | Discharge status: Home / Self Care | Payer: Medicare Other | Source: Ambulatory Visit | Attending: Adult Health | Admitting: Adult Health

## 2022-01-27 DIAGNOSIS — G8111 Spastic hemiplegia affecting right dominant side: Secondary | ICD-10-CM | POA: Diagnosis not present

## 2022-01-27 DIAGNOSIS — Z8673 Personal history of transient ischemic attack (TIA), and cerebral infarction without residual deficits: Secondary | ICD-10-CM | POA: Diagnosis not present

## 2022-01-28 ENCOUNTER — Telehealth: Payer: Self-pay

## 2022-01-28 NOTE — Progress Notes (Signed)
Please call and inform patient that the MRI shows new stroke in the right parietal and right occipital region when compared to the MRI in 2020. Advised patient to add aspirin 81 mg with Plavix until his next appointment with Janett Billow.

## 2022-01-28 NOTE — Telephone Encounter (Signed)
I called the pt and relayed the message. He verbalized understanding and agreement to the plan and will add Asp 81 mg to the plavix.   Will send note to [facility] for review once she is back in the office.

## 2022-01-28 NOTE — Telephone Encounter (Signed)
-----   Message from Alric Ran, MD sent at 01/28/2022 11:22 AM EDT ----- Please call and inform patient that the MRI shows new stroke in the right parietal and right occipital region when compared to the MRI in 2020. Advised patient to add aspirin 81 mg with Plavix until his next appointment with Janett Billow.

## 2022-01-31 ENCOUNTER — Other Ambulatory Visit: Payer: Self-pay | Admitting: Adult Health

## 2022-02-01 NOTE — Telephone Encounter (Signed)
Reviewed MRI brain. Based on the findings, there was no evidence of any new strokes, did note prior right occipital and right parietal  infarcts but this was noted to be unchanged compared to prior MRI in 2020.  No indication for aspirin therapy at this time in addition to Plavix but I will forward to Dr. Leonie Man for further input. Can wait to notify patient until further input by Dr. Leonie Man. Thank you.

## 2022-02-01 NOTE — Telephone Encounter (Signed)
I called pt and we discussed message. He was agreeable to holding Asprin until confirmed with Dr. Leonie Man. I advised we would call back soon on update with this.

## 2022-02-03 NOTE — Telephone Encounter (Signed)
As episode of right sided weakness occurred approx 2 months ago and recent MRI negative, no indication to restart aspirin and continue on Plavix alone.

## 2022-02-03 NOTE — Telephone Encounter (Signed)
I called the pt back and relayed NP's message and recommendation. Pt was agreeable to plan and will call back if any issues come up.

## 2022-02-16 NOTE — Progress Notes (Signed)
[facility] Summary Report / Loop Recorder 

## 2022-02-18 ENCOUNTER — Encounter: Payer: Self-pay | Admitting: Neurology

## 2022-02-18 ENCOUNTER — Ambulatory Visit (INDEPENDENT_AMBULATORY_CARE_PROVIDER_SITE_OTHER): Payer: Medicare Other | Admitting: Neurology

## 2022-02-18 VITALS — BP 132/79 | HR 65 | Ht 65.0 in | Wt 177.9 lb

## 2022-02-18 DIAGNOSIS — R0683 Snoring: Secondary | ICD-10-CM | POA: Diagnosis not present

## 2022-02-18 DIAGNOSIS — R351 Nocturia: Secondary | ICD-10-CM

## 2022-02-18 DIAGNOSIS — Z8673 Personal history of transient ischemic attack (TIA), and cerebral infarction without residual deficits: Secondary | ICD-10-CM | POA: Diagnosis not present

## 2022-02-18 DIAGNOSIS — E663 Overweight: Secondary | ICD-10-CM

## 2022-02-18 NOTE — Progress Notes (Addendum)
Subjective:    Patient ID: Edward Glass is a 66 y.o. male.  HPI    Star Age, MD, PhD [facility] [address]O. [place], [place] [address]  Dear Janett Billow and Mamie Nick,   I saw your patient, Edward Glass, upon your kind request in my sleep clinic today for initial consultation of his sleep disorder, in particular, concern for underlying obstructive sleep apnea.  The patient is unaccompanied today.  As you know, Edward Glass is a 66 year old right-handed gentleman with an underlying complex medical history of stroke with residual right-sided weakness, vitamin D deficiency, hypertension, hyperlipidemia, hypogonadism, chest pain, arthritis, status post loop recorder placement, and borderline obesity, who reports rare snoring and significant nocturia.  I reviewed your office note from 01/04/2022.  His Epworth sleepiness score is 3 out of 24, fatigue severity score is 9 out of 63.  He denies any significant daytime somnolence.  Bedtime is typically 9 PM and rise time around 7 AM.  He works as a Physiological scientist.  He uses his cane when he goes out but not inside the home.  He has seen urology for nocturia and has tried 2 different medications which were not helpful, he does not recall the names of his medications that he tried for his bladder or prostate.  He has nocturia about 6 or 7 times per average night.  He denies recurrent nocturnal or morning headaches.  He lives with his girlfriend, he has 2 grown daughters.  He has a TV in his bedroom and it tends to be on until 10 PM.  He has no pets in the household.  His weight has been stable.  He does not drink caffeine daily, he does not drink any alcohol, he is a non-smoker.  His Past Medical History Is Significant For: Past Medical History:  Diagnosis Date   Arthritis    right shoulder   Atypical chest pain 08/10/2013   BPH (benign prostatic hyperplasia)    COVID-19 07/2020   CVA (cerebral vascular  accident) ([place]) 08/2018   spastic hemiparesis of right dominant side   Dysphagia    Elevated aspartate aminotransferase level 06/16/2015   Excessive salivation 06/16/2015   H/O nutritional disorder 06/16/2015   H/O vitamin D deficiency    History of loop recorder 2020   Hx of adenomatous colonic polyps 2016   Hyperlipidemia    Hypertension    Hypogonadism male 08/21/2014   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   Screening for prostate cancer 08/21/2014   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    His Past Surgical History Is Significant For: Past Surgical History:  Procedure Laterality Date   COLONOSCOPY     HERNIA REPAIR  2006   left tricep surgery     LOOP RECORDER INSERTION N/A 09/04/2018   Procedure: LOOP RECORDER INSERTION;  Surgeon: Evans Lance, MD;  Location: [facility];  Service: Cardiovascular;  Laterality: N/A;   TEE WITHOUT CARDIOVERSION N/A 09/04/2018   Procedure: TRANSESOPHAGEAL ECHOCARDIOGRAM (TEE);  Surgeon: Jerline Pain, MD;  Location[facility];  Service: Cardiovascular;  Laterality: N/A;   TOTAL SHOULDER ARTHROPLASTY Right 12/16/2020   Procedure: RIGHT TOTAL SHOULDER ARTHROPLASTY;  Surgeon: Meredith Pel, MD;  Location: [facility];  Service: Orthopedics;  Laterality: Right;    His Family History Is Significant For: Family History  Problem Relation Age of Onset   Kidney failure Mother    Cancer Mother    Prostate cancer Father    Cancer Father  Diabetes Father    Colon cancer Neg Hx    Esophageal cancer Neg Hx    Rectal cancer Neg Hx    Stomach cancer Neg Hx     His Social History Is Significant For: Social History   Socioeconomic History   Marital status: Divorced    Spouse name: Not on file   Number of children: 2   Years of education: Not on file   Highest education level: Not on file  Occupational History   Occupation: Physiological scientist  Tobacco Use   Smoking status: Never   Smokeless tobacco: Never  Vaping Use   Vaping Use: Never used   Substance and Sexual Activity   Alcohol use: No   Drug use: No   Sexual activity: Not on file  Other Topics Concern   Not on file  Social History Narrative   Not on file   Social Determinants of Health   Financial Resource Strain: Low Risk  (09/05/2018)   Overall Financial Resource Strain (CARDIA)    Difficulty of Paying Living Expenses: Not hard at all  Food Insecurity: No Food Insecurity (09/05/2018)   Hunger Vital Sign    Worried About Running Out of Food in the Last Year: Never true    [place] in the Last Year: Never true  Transportation Needs: No Transportation Needs (09/05/2018)   PRAPARE - Hydrologist (Medical): No    Lack of Transportation (Non-Medical): No  Physical Activity: Sufficiently Active (09/05/2018)   Exercise Vital Sign    Days of Exercise per Week: 6 days    Minutes of Exercise per Session: 30 min  Stress: No Stress Concern Present (09/05/2018)   [place]    Feeling of Stress : Not at all  Social Connections: Unknown (09/05/2018)   Social Connection and Isolation Panel [NHANES]    Frequency of Communication with Friends and Family: Three times a week    Frequency of Social Gatherings with Friends and Family: Three times a week    Attends Religious Services: Not on file    Active Member of Clubs or Organizations: Yes    Attends Club or Organization Meetings: 1 to 4 times per year    Marital Status: Not on file    His Allergies Are:  No Known Allergies:   His Current Medications Are:  Outpatient Encounter Medications as of 02/18/2022  Medication Sig   acetaminophen (TYLENOL) 325 MG tablet Take 2 tablets (650 mg total) by mouth every 4 (four) hours as needed for mild pain (or temp > 37.5 C (99.5 F)).   albuterol (VENTOLIN HFA) 108 (90 Base) MCG/ACT inhaler as needed for shortness of breath.   amLODipine (NORVASC) 5 MG tablet Take 5 mg by mouth daily.    Baclofen 5 MG TABS TAKE 1 TABLET BY MOUTH THREE TIMES A DAY   clopidogrel (PLAVIX) 75 MG tablet TAKE 1 TABLET BY MOUTH EVERY DAY   diclofenac Sodium (VOLTAREN) 1 % GEL Apply topically in the morning and at bedtime.   FLUoxetine (PROZAC) 20 MG capsule Take by mouth.   Multiple Vitamin (MULTIVITAMIN PO) Take by mouth daily.   multivitamin (ONE-A-DAY MEN'S) TABS tablet Take 1 tablet by mouth daily.   rosuvastatin (CRESTOR) 20 MG tablet TAKE 1 TABLET BY MOUTH EVERY DAY   No facility-administered encounter medications on file as of 02/18/2022.  :   Review of Systems:  Out of a complete 14 point  review of systems, all are reviewed and negative with the exception of these symptoms as listed below:  Review of Systems  Neurological:        Pt here for sleep consult  Pt states little snoring   Pt denies fatigue ,hypertension,headaches ,sleep study,CPAP machine     ESS:3 FSS:9    Objective:  Neurological Exam  Physical Exam Physical Examination:   Vitals:   02/18/22 1247  BP: 132/79  Pulse: 65    General Examination: The patient is a very pleasant 66 y.o. male in no acute distress. He appears well-developed and well-nourished and well groomed.   HEENT: Normocephalic, atraumatic, pupils are equal, round and reactive to light, extraocular tracking is normal, mild facial masking noted.  Speech with slight dysarthria.  Airway examination reveals mild mouth dryness, good dental hygiene, mild airway crowding secondary to longer uvula.  Tonsils on the smaller side, Mallampati class II.  Neck circumference of 14 5/8 inches, minimal overbite.  Tongue protrudes centrally and palate elevates symmetrically.    Chest: Clear to auscultation without wheezing, rhonchi or crackles noted.  Heart: S1+S2+0, regular and normal without murmurs, rubs or gallops noted.   Abdomen: Soft, non-tender and non-distended.  Skin: Warm and dry without trophic changes noted.   Musculoskeletal: exam reveals no  obvious joint deformities.  Decreased mobility right upper extremity.  Neurologically:  Mental status: The patient is awake, alert and oriented in all 4 spheres. His immediate and remote memory, attention, language skills and fund of knowledge are appropriate. There is no evidence of aphasia, agnosia, apraxia or anomia. Speech is clear with normal prosody and enunciation. Thought process is linear. Mood is normal and affect is normal.  Cranial nerves II - XII are as described above under HEENT exam.  Motor exam: Right-sided weakness noted.  Walks with a four-prong cane with a mild limp on the right.  Fine motor skills and coordination: grossly intact left.   Assessment and Plan:  In summary, Edward Glass is a very pleasant 66 y.o.-year old male with an underlying complex medical history of stroke with residual right-sided weakness, vitamin D deficiency, hypertension, hyperlipidemia, hypogonadism, chest pain, arthritis, status post loop recorder placement, and borderline obesity, who who presents for evaluation of his sleep disturbance, particularly nocturia.  He denies any daytime somnolence or snoring per se.  Sleep testing would be indicated to rule out obstructive sleep apnea as he has a history of stroke.     I had a long chat with the patient about OSA, its prognosis and treatment options. We talked about medical/conservative treatments, surgical interventions and non-pharmacological approaches for symptom control. I explained, in particular, the risks and ramifications of untreated moderate to severe OSA, especially with respect to developing cardiovascular disease down the road, including congestive heart failure (CHF), difficult to treat hypertension, cardiac arrhythmias (particularly A-fib), neurovascular complications including TIA, stroke and dementia. Even type 2 diabetes has, in part, been linked to untreated OSA. Symptoms of untreated OSA may include (but may not be limited to) daytime  sleepiness, nocturia (i.e. frequent nighttime urination), memory problems, mood irritability and suboptimally controlled or worsening mood disorder such as depression and/or anxiety, lack of energy, lack of motivation, physical discomfort, as well as recurrent headaches, especially morning or nocturnal headaches. We talked about the importance of maintaining a healthy lifestyle and striving for healthy weight.  In addition, we talked about the importance of striving for and maintaining good sleep hygiene. I recommended the following at this time:  sleep study.  I outlined the differences between a laboratory attended sleep study which is considered more comprehensive and accurate over the option of a home sleep test (HST); the latter may lead to underestimation of sleep disordered breathing in some instances and does not help with diagnosing upper airway resistance syndrome and is not accurate enough to diagnose primary central sleep apnea typically. I explained the different sleep test procedures to the patient in detail and also outlined possible surgical and non-surgical treatment options of OSA, including the use of a pressure airway pressure (PAP) device (ie CPAP, AutoPAP/APAP or BiPAP in certain circumstances), a custom-made dental device (aka oral appliance, which would require a referral to a specialist dentist or orthodontist typically, and is generally speaking not considered a good choice for patients with full dentures or edentulous state), upper airway surgical options, such as traditional UPPP (which is not considered a first-line treatment) or the Inspire device (hypoglossal nerve stimulator, which would involve a referral for consultation with an ENT surgeon, after careful selection, following inclusion criteria). I explained the PAP treatment option to the patient in detail, as this is generally considered first-line treatment.  The patient indicated that he would be willing to try PAP therapy, if  the need arises. I explained the importance of being compliant with PAP treatment, not only for insurance purposes but primarily to improve patient's symptoms symptoms, and for the patient's long term health benefit, including to reduce His cardiovascular risks longer-term.    We will pick up our discussion about the next steps and treatment options after testing.  We will keep him posted as to the test results by phone call and/or MyChart messaging where possible.  We will plan to follow-up in sleep clinic accordingly as well.  I answered all his questions today and the patient was in agreement.   I encouraged him to call with any interim questions, concerns, problems or updates or email [place] through [place].  Generally speaking, sleep test authorizations may take up to 2 weeks, sometimes less, sometimes longer, the patient is encouraged to get in touch with [place] if they do not hear back from the sleep lab staff directly within the next 2 weeks.  Thank you very much for allowing me to participate in the care of this nice patient. If I can be of any further assistance to you please do not hesitate to call me at [phone].  Sincerely,   Star Age, MD, PhD

## 2022-02-18 NOTE — Patient Instructions (Signed)

## 2022-02-19 ENCOUNTER — Encounter: Payer: Medicare Other | Admitting: Physical Medicine & Rehabilitation

## 2022-02-25 ENCOUNTER — Ambulatory Visit: Payer: Medicare Other | Admitting: Internal Medicine

## 2022-02-25 ENCOUNTER — Encounter: Payer: Self-pay | Admitting: Internal Medicine

## 2022-02-25 ENCOUNTER — Ambulatory Visit (INDEPENDENT_AMBULATORY_CARE_PROVIDER_SITE_OTHER): Payer: Medicare Other | Admitting: Internal Medicine

## 2022-02-25 VITALS — BP 120/80 | HR 65 | Ht 64.0 in | Wt 176.8 lb

## 2022-02-25 DIAGNOSIS — R0602 Shortness of breath: Secondary | ICD-10-CM

## 2022-02-25 LAB — PULMONARY FUNCTION TEST
DL/VA % pred: 128 %
DL/VA: 5.42 ml/min/mmHg/L
DLCO cor % pred: 113 %
DLCO cor: 24.7 ml/min/mmHg
DLCO unc % pred: 114 %
DLCO unc: 24.97 ml/min/mmHg
FEF 25-75 Post: 1.93 L/sec
FEF 25-75 Pre: 1.49 L/sec
FEF2575-%Change-Post: 29 %
FEF2575-%Pred-Post: 92 %
FEF2575-%Pred-Pre: 71 %
FEV1-%Change-Post: 4 %
FEV1-%Pred-Post: 72 %
FEV1-%Pred-Pre: 69 %
FEV1-Post: 1.91 L
FEV1-Pre: 1.84 L
FEV1FVC-%Change-Post: 6 %
FEV1FVC-%Pred-Pre: 104 %
FEV6-%Change-Post: -2 %
FEV6-%Pred-Post: 68 %
FEV6-%Pred-Pre: 70 %
FEV6-Post: 2.31 L
FEV6-Pre: 2.37 L
FEV6FVC-%Pred-Post: 106 %
FEV6FVC-%Pred-Pre: 106 %
FVC-%Change-Post: -2 %
FVC-%Pred-Post: 64 %
FVC-%Pred-Pre: 66 %
FVC-Post: 2.31 L
FVC-Pre: 2.37 L
Post FEV1/FVC ratio: 83 %
Post FEV6/FVC ratio: 100 %
Pre FEV1/FVC ratio: 78 %
Pre FEV6/FVC Ratio: 100 %
RV % pred: 93 %
RV: 1.92 L
TLC % pred: 81 %
TLC: 4.73 L

## 2022-02-25 NOTE — Progress Notes (Signed)
Full PFT Performed Today  

## 2022-02-25 NOTE — Progress Notes (Signed)
Edward Glass    [id number]    [birth year]  Primary Care Physician:Briscoe, Jannifer Rodney, MD Date of Appointment: 02/25/2022 Established Patient Visit  Chief complaint:   Chief Complaint  Patient presents with   Follow-up    Follow-up: PFT     HPI: Edward Glass  is a 66 y.o. man with history of ischemic stroke with shortness of breath.   Interval Updates: Here for follow up after PFTs. Normal pulmonary function. Sleep study ordered by neurology and is pending.   No wheezing, chest tightness, coughing. Just has fatigue after extreme exertion.   No seasonal allergies. No hypoxemia.   I have reviewed the patient's family social and past medical history and updated as appropriate.   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   Screening for prostate cancer 08/21/2014   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    Past Surgical History:  Procedure Laterality Date   COLONOSCOPY     HERNIA REPAIR  2006   left tricep surgery     LOOP RECORDER INSERTION N/A 09/04/2018   Procedure: LOOP RECORDER INSERTION;  Surgeon: Evans Lance, MD;  Location: [facility];  Service: Cardiovascular;  Laterality: N/A;   TEE WITHOUT CARDIOVERSION N/A 09/04/2018   Procedure: TRANSESOPHAGEAL ECHOCARDIOGRAM (TEE);  Surgeon: Jerline Pain, MD;  Location: [facility] ENDOSCOPY;  Service: Cardiovascular;  Laterality: N/A;   TOTAL SHOULDER ARTHROPLASTY Right  12/16/2020   Procedure: RIGHT TOTAL SHOULDER ARTHROPLASTY;  Surgeon: Meredith Pel, MD;  Location: [facility];  Service: Orthopedics;  Laterality: Right;    Family History  Problem Relation Age of Onset   Kidney failure Mother    Cancer Mother    Prostate cancer Father    Cancer Father    Diabetes Father    Colon cancer Neg Hx    Esophageal cancer Neg Hx    Rectal cancer Neg Hx    Stomach cancer Neg Hx     Social History   Occupational History   Occupation: Physiological scientist  Tobacco Use   Smoking status: Never   Smokeless tobacco: Never  Vaping Use   Vaping Use: Never used  Substance and Sexual Activity   Alcohol use: No   Drug use: No   Sexual activity: Not on file     Physical Exam: Blood pressure 120/80, pulse 65, height '5\' 4"'$  (1.626 m), weight 176 lb 12.8 oz (80.2 kg), SpO2 93 %.  Gen:      No acute distress, ambulates with cane Lungs:    No increased respiratory effort, symmetric chest wall excursion, clear to auscultation bilaterally, no wheezes or crackles CV:         Regular rate and rhythm; no murmurs, rubs, or gallops.  No pedal edema   Data Reviewed: Imaging: I have personally reviewed the chest xray Sept 2022 - no acute process  PFTs:  Latest Ref Rng & Units 02/25/2022   12:54 PM  PFT Results  FVC-Pre L 2.37  P  FVC-Predicted Pre % 66  P  FVC-Post L 2.31  P  FVC-Predicted Post % 64  P  Pre FEV1/FVC % % 78  P  Post FEV1/FCV % % 83  P  FEV1-Pre L 1.84  P  FEV1-Predicted Pre % 69  P  FEV1-Post L 1.91  P  DLCO uncorrected ml/min/mmHg 24.97  P  DLCO UNC% % 114  P  DLCO corrected ml/min/mmHg 24.70  P  DLCO COR %Predicted % 113  P  DLVA Predicted % 128  P  TLC L 4.73  P  TLC % Predicted % 81  P  RV % Predicted % 93  P    P Preliminary result   I have personally reviewed the patient's PFTs and normal pulmonary function.   Labs: Lab Results  Component Value Date   WBC 6.6 01/04/2022   HGB 15.0 01/04/2022   HCT 44.7 01/04/2022   MCV 87  01/04/2022   PLT 277 01/04/2022   Lab Results  Component Value Date   NA 140 01/04/2022   K 4.7 01/04/2022   CL 102 01/04/2022   CO2 23 01/04/2022    Immunization status: Immunization History  Administered Date(s) Administered   Fluad Quad(high Dose 65+) 04/07/2021   Influenza Split 05/10/2012   Influenza,inj,Quad PF,6+ Mos 05/09/2013, 05/08/2014   PFIZER(Purple Top)SARS-COV-2 Vaccination 09/29/2019, 10/20/2019   Pneumococcal Conjugate-13 04/01/2020   Pneumococcal Polysaccharide-23 04/07/2021   Td 06/17/2003   Tdap 01/17/2013   Zoster Recombinat (Shingrix) 11/02/2017, 04/20/2018   Zoster, Live 02/20/2015    External Records Personally Reviewed: cardiology, neuro  Assessment:  Shortness of breath  - normal pulmonary function Acute ishcemic stroke Possible OSA  Plan/Recommendations: Unclear etioloy of dyspnea. Agree with osa eval. Consider deconditioning if no improvement.  He saw cardiology and was able to do 45 minutes on a recumbent bike without dyspnea.    Return to Care: Return if symptoms worsen or fail to improve.   Lenice Llamas, MD Pulmonary and [place]

## 2022-02-25 NOTE — Patient Instructions (Signed)
Full PFT Performed Today  

## 2022-02-25 NOTE — Patient Instructions (Signed)
Lung function was normal - I do not think your shortness of breath is coming from the lungs. Agree with sleep apnea evaluation. If that is normal would consider cardiac etiology as well.   Come back and see me as needed if symptoms worsen or change!

## 2022-03-01 ENCOUNTER — Ambulatory Visit (INDEPENDENT_AMBULATORY_CARE_PROVIDER_SITE_OTHER): Payer: Medicare Other

## 2022-03-01 ENCOUNTER — Other Ambulatory Visit: Payer: Self-pay | Admitting: Adult Health

## 2022-03-01 DIAGNOSIS — I639 Cerebral infarction, unspecified: Secondary | ICD-10-CM | POA: Diagnosis not present

## 2022-03-01 LAB — CUP PACEART REMOTE DEVICE CHECK
Date Time Interrogation Session: 20230804232711
Implantable Pulse Generator Implant Date: 20200210

## 2022-03-25 ENCOUNTER — Other Ambulatory Visit: Payer: Self-pay

## 2022-03-25 ENCOUNTER — Emergency Department (HOSPITAL_COMMUNITY)
Admission: EM | Admit: 2022-03-25 | Discharge: 2022-03-25 | Disposition: A | Discharge status: Home / Self Care | Payer: Medicare Other | Attending: Emergency Medicine | Admitting: Emergency Medicine

## 2022-03-25 ENCOUNTER — Emergency Department (HOSPITAL_COMMUNITY): Payer: Medicare Other

## 2022-03-25 ENCOUNTER — Encounter (HOSPITAL_COMMUNITY): Payer: Self-pay

## 2022-03-25 ENCOUNTER — Telehealth: Payer: Self-pay | Admitting: Neurology

## 2022-03-25 DIAGNOSIS — M545 Low back pain, unspecified: Secondary | ICD-10-CM | POA: Diagnosis not present

## 2022-03-25 DIAGNOSIS — Z7902 Long term (current) use of antithrombotics/antiplatelets: Secondary | ICD-10-CM | POA: Insufficient documentation

## 2022-03-25 DIAGNOSIS — Z79899 Other long term (current) drug therapy: Secondary | ICD-10-CM | POA: Insufficient documentation

## 2022-03-25 DIAGNOSIS — R200 Anesthesia of skin: Secondary | ICD-10-CM | POA: Diagnosis present

## 2022-03-25 DIAGNOSIS — I639 Cerebral infarction, unspecified: Secondary | ICD-10-CM | POA: Diagnosis not present

## 2022-03-25 LAB — COMPREHENSIVE METABOLIC PANEL
ALT: 35 U/L (ref 0–44)
AST: 44 U/L — ABNORMAL HIGH (ref 15–41)
Albumin: 4.3 g/dL (ref 3.5–5.0)
Alkaline Phosphatase: 57 U/L (ref 38–126)
Anion gap: 12 (ref 5–15)
BUN: 26 mg/dL — ABNORMAL HIGH (ref 8–23)
CO2: 23 mmol/L (ref 22–32)
Calcium: 9.9 mg/dL (ref 8.9–10.3)
Chloride: 104 mmol/L (ref 98–111)
Creatinine, Ser: 0.87 mg/dL (ref 0.61–1.24)
GFR, Estimated: >60 mL/min (ref >60)
Glucose, Bld: 105 mg/dL — ABNORMAL HIGH (ref 70–99)
Potassium: 4.3 mmol/L (ref 3.5–5.1)
Sodium: 139 mmol/L (ref 135–145)
Total Bilirubin: 0.4 mg/dL (ref 0.3–1.2)
Total Protein: 7.2 g/dL (ref 6.5–8.1)

## 2022-03-25 LAB — ETHANOL: Alcohol, Ethyl (B): <10 mg/dL (ref <10)

## 2022-03-25 LAB — DIFFERENTIAL
Abs Immature Granulocytes: 0.01 10*3/uL (ref 0.00–0.07)
Basophils Absolute: 0 10*3/uL (ref 0.0–0.1)
Basophils Relative: 0 %
Eosinophils Absolute: 0.1 10*3/uL (ref 0.0–0.5)
Eosinophils Relative: 1 %
Immature Granulocytes: 0 %
Lymphocytes Relative: 27 %
Lymphs Abs: 1.8 10*3/uL (ref 0.7–4.0)
Monocytes Absolute: 0.5 10*3/uL (ref 0.1–1.0)
Monocytes Relative: 8 %
Neutro Abs: 4.3 10*3/uL (ref 1.7–7.7)
Neutrophils Relative %: 64 %

## 2022-03-25 LAB — CBC
HCT: 47.6 % (ref 39.0–52.0)
Hemoglobin: 15.7 g/dL (ref 13.0–17.0)
MCH: 29.9 pg (ref 26.0–34.0)
MCHC: 33 g/dL (ref 30.0–36.0)
MCV: 90.7 fL (ref 80.0–100.0)
Platelets: 326 10*3/uL (ref 150–400)
RBC: 5.25 MIL/uL (ref 4.22–5.81)
RDW: 13.1 % (ref 11.5–15.5)
WBC: 6.7 10*3/uL (ref 4.0–10.5)
nRBC: 0 % (ref 0.0–0.2)

## 2022-03-25 LAB — I-STAT CHEM 8, ED
BUN: 28 mg/dL — ABNORMAL HIGH (ref 8–23)
Calcium, Ion: 1.19 mmol/L (ref 1.15–1.40)
Chloride: 104 mmol/L (ref 98–111)
Creatinine, Ser: 0.8 mg/dL (ref 0.61–1.24)
Glucose, Bld: 98 mg/dL (ref 70–99)
HCT: 49 % (ref 39.0–52.0)
Hemoglobin: 16.7 g/dL (ref 13.0–17.0)
Potassium: 3.8 mmol/L (ref 3.5–5.1)
Sodium: 138 mmol/L (ref 135–145)
TCO2: 23 mmol/L (ref 22–32)

## 2022-03-25 LAB — APTT: aPTT: 31 seconds (ref 24–36)

## 2022-03-25 LAB — CBG MONITORING, ED: Glucose-Capillary: 111 mg/dL — ABNORMAL HIGH (ref 70–99)

## 2022-03-25 LAB — PROTIME-INR
INR: 1.1 (ref 0.8–1.2)
Prothrombin Time: 13.7 seconds (ref 11.4–15.2)

## 2022-03-25 MED ORDER — SODIUM CHLORIDE 0.9% FLUSH: 3.0000 mL | Freq: Once | INTRAVENOUS | Status: DC

## 2022-03-25 NOTE — ED Triage Notes (Signed)
Patient was at the gym and had "stiffness all over" tingling to left side of face and worsening slurred speech. Patient has previous stroke with right sided facial droop, slurred speech and right side weakness.  Daughter reports speech is worse than it was last night.  Symptoms started around 1330

## 2022-03-25 NOTE — ED Notes (Addendum)
Patient verbalizes understanding of discharge instructions. Opportunity for questioning and answers were provided. Armband removed by staff, pt discharged from ED. Pt taken to ED entrance via wheel chair.  

## 2022-03-25 NOTE — ED Notes (Signed)
Patient transported to MRI 

## 2022-03-25 NOTE — ED Provider Notes (Signed)
[facility] EMERGENCY DEPARTMENT Provider Note   CSN: [id number] Arrival date & time: 03/25/22  1424     History  Chief Complaint  Patient presents with   Cerebrovascular Accident    Edward Glass is a 66 y.o. male. Facial numbness, speech changes, left lower extremity paresthesia Reports that this morning he developed numbness to his left mid cheek.  This has been intermittent since that time. At 1:30 PM patient was at the gym doing usual exercises when he developed stiffness in his whole body, but was still able to ambulate. He had left sided low back pain, that is not knew. He then reported his left lower extremity feeling "funny". He was then brought to ED. Family reports his speech is intermittently different today, but they do contribute this partly to patient being upset and tearful. Patient is compliant with Plavix and not currently on ASA.  He denies any recent falls.  Cerebrovascular Accident Associated symptoms include headaches. Pertinent negatives include no chest pain, no abdominal pain and no shortness of breath.       Home Medications Prior to Admission medications   Medication Sig Start Date End Date Taking? Authorizing Provider  acetaminophen (TYLENOL) 325 MG tablet Take 2 tablets (650 mg total) by mouth every 4 (four) hours as needed for mild pain (or temp > 37.5 C (99.5 F)). 09/29/18   Angiulli, Lavon Paganini, PA-C  albuterol (VENTOLIN HFA) 108 (90 Base) MCG/ACT inhaler as needed for shortness of breath. 06/08/21   [provider]  amLODipine (NORVASC) 5 MG tablet Take 5 mg by mouth daily. 03/25/20   [provider]  Baclofen 5 MG TABS TAKE 1 TABLET BY MOUTH THREE TIMES A DAY 03/01/22   Frann Rider, NP  clopidogrel (PLAVIX) 75 MG tablet TAKE 1 TABLET BY MOUTH EVERY DAY 02/01/22   Frann Rider, NP  diclofenac Sodium (VOLTAREN) 1 % GEL Apply topically in the morning and at bedtime. 03/27/19   [provider]  FLUoxetine  (PROZAC) 20 MG capsule Take by mouth. 12/30/21   [provider]  Multiple Vitamin (MULTIVITAMIN PO) Take by mouth daily.    [provider]  multivitamin (ONE-A-DAY MEN'S) TABS tablet Take 1 tablet by mouth daily.    [provider]  rosuvastatin (CRESTOR) 20 MG tablet TAKE 1 TABLET BY MOUTH EVERY DAY 10/29/21   Frann Rider, NP      Allergies    Patient has no known allergies.    Review of Systems   Review of Systems  Constitutional:  Negative for chills and fever.  HENT:  Negative for ear pain and sore throat.   Eyes:  Negative for pain and visual disturbance.  Respiratory:  Negative for cough and shortness of breath.   Cardiovascular:  Negative for chest pain and palpitations.  Gastrointestinal:  Negative for abdominal pain and vomiting.  Genitourinary:  Negative for dysuria and hematuria.  Musculoskeletal:  Negative for arthralgias and back pain.  Skin:  Negative for color change and rash.  Neurological:  Positive for speech difficulty, numbness and headaches. Negative for seizures and syncope.  All other systems reviewed and are negative.   Physical Exam Updated Vital Signs BP 126/75   Pulse (!) 55   Temp 98.2 F (36.8 C) (Oral)   Resp 17   Ht '5\' 4"'$  (1.626 m)   Wt 79.8 kg   SpO2 98%   BMI 30.21 kg/m  Physical Exam Vitals and nursing note reviewed.  Constitutional:  General: He is not in acute distress.    Appearance: He is well-developed.  HENT:     Head: Normocephalic and atraumatic.  Eyes:     General: No visual field deficit.    Conjunctiva/sclera: Conjunctivae normal.  Cardiovascular:     Rate and Rhythm: Normal rate and regular rhythm.     Heart sounds: No murmur heard. Pulmonary:     Effort: Pulmonary effort is normal. No respiratory distress.     Breath sounds: Normal breath sounds.  Abdominal:     Palpations: Abdomen is soft.     Tenderness: There is no abdominal tenderness.  Musculoskeletal:        General: No  swelling.     Cervical back: Neck supple.  Skin:    General: Skin is warm and dry.     Capillary Refill: Capillary refill takes less than 2 seconds.  Neurological:     Mental Status: He is alert and oriented to person, place, and time. Mental status is at baseline.     GCS: GCS eye subscore is 4. GCS verbal subscore is 5. GCS motor subscore is 6.     Cranial Nerves: Facial asymmetry (mild right facial droop at rest (baseline)) present. No cranial nerve deficit or dysarthria.     Sensory: Sensation is intact.     Motor: Weakness (mild RUE and [name] weakness (4/5) - baseline) present.     Coordination: Coordination is intact.  Psychiatric:        Mood and Affect: Mood normal.     ED Results / Procedures / Treatments   Labs (all labs ordered are listed, but only abnormal results are displayed) Labs Reviewed  COMPREHENSIVE METABOLIC PANEL - Abnormal; Notable for the following components:      Result Value   Glucose, Bld 105 (*)    BUN 26 (*)    AST 44 (*)    All other components within normal limits  CBG MONITORING, ED - Abnormal; Notable for the following components:   Glucose-Capillary 111 (*)    All other components within normal limits  I-STAT CHEM 8, ED - Abnormal; Notable for the following components:   BUN 28 (*)    All other components within normal limits  PROTIME-INR  APTT  CBC  DIFFERENTIAL  ETHANOL    EKG EKG Interpretation  Date/Time:  Thursday March 25 2022 15:57:11 EDT Ventricular Rate:  57 PR Interval:  181 QRS Duration: 98 QT Interval:  429 QTC Calculation: 418 R Axis:   69 Text Interpretation: Sinus rhythm Artifact Abnormal ECG Confirmed by Carmin Muskrat [phone]) on 03/25/2022 4:49:38 PM  Radiology MR BRAIN WO CONTRAST  Result Date: 03/25/2022 CLINICAL DATA:  Initial evaluation for acute TIA. EXAM: MRI HEAD WITHOUT CONTRAST TECHNIQUE: Multiplanar, multiecho pulse sequences of the brain and surrounding structures were obtained without intravenous  contrast. COMPARISON:  CT from earlier the same day as well as prior brain MRI from 01/27/2022. FINDINGS: Brain: Examination somewhat degraded by motion artifact. Generalized age-related cerebral atrophy. Mild chronic microvascular ischemic disease noted involving the periventricular white matter. Remote lacunar infarcts present at the left basal ganglia/corona radiata. Few additional small remote cortical infarcts noted involving the right frontal, parietal, and occipital lobes. No evidence for acute or subacute ischemia. Gray-white matter differentiation otherwise maintained. No acute intracranial hemorrhage. No mass lesion, midline shift or mass effect. Mild ex vacuo dilatation of the left lateral ventricle related to the chronic left cerebral ischemic changes. No hydrocephalus. No extra-axial fluid collection. Pituitary gland  and suprasellar region within normal limits. Vascular: Major intracranial vascular flow voids are maintained. Skull and upper cervical spine: Craniocervical junction within normal limits. Bone marrow signal intensity grossly normal. No scalp soft tissue abnormality. Sinuses/Orbits: Globes orbital soft tissues within normal limits. Scattered mucosal thickening noted about the ethmoidal air cells. Mastoid air cells are largely clear. Other: None. IMPRESSION: 1. No acute intracranial abnormality. 2. Generalized age-related cerebral atrophy with mild chronic small vessel ischemic disease, with remote lacunar infarcts at the left basal ganglia/corona radiata, with a few additional small remote cortical infarcts involving the right cerebral hemisphere. Electronically Signed   By: Jeannine Boga M.D.   On: 03/25/2022 21:42   CT HEAD WO CONTRAST  Result Date: 03/25/2022 CLINICAL DATA:  Left facial tingling and slurred speech. History of stroke. EXAM: CT HEAD WITHOUT CONTRAST TECHNIQUE: Contiguous axial images were obtained from the base of the skull through the vertex without intravenous  contrast. RADIATION DOSE REDUCTION: This exam was performed according to the departmental dose-optimization program which includes automated exposure control, adjustment of the mA and/or kV according to patient size and/or use of iterative reconstruction technique. COMPARISON:  Brain MRI 01/27/2022 FINDINGS: Brain: There is no acute intracranial hemorrhage, extra-axial fluid collection, or acute infarct. Background parenchymal volume is within normal limits. Remote infarcts in the left MCA distribution are again seen with ex vacuo dilatation of the left lateral ventricle. A remote cortical infarct in the right parietal lobe is unchanged. Gray-white differentiation is otherwise preserved. The ventricles are otherwise normal in size. There is no mass lesion.  There is no mass effect or midline shift. Vascular: No hyperdense vessel or unexpected calcification. Skull: Normal. Negative for fracture or focal lesion. Sinuses/Orbits: The imaged paranasal sinuses are clear. The globes and orbits are unremarkable. Other: None. IMPRESSION: No acute intracranial pathology. Electronically Signed   By: Valetta Mole M.D.   On: 03/25/2022 15:09    Procedures Procedures    Medications Ordered in ED Medications  sodium chloride flush (NS) 0.9 % injection 3 mL (3 mLs Intravenous Not Given 03/25/22 1709)    ED Course/ Medical Decision Making/ A&P                           Medical Decision Making Amount and/or Complexity of Data Reviewed Radiology: ordered.    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  On my evaluation, patient has baseline mild right facial, RUE, and [name] weakness but does not have any sensation  deficits or left sided weakness. Neurologic exam is at his baseline, confirmed by patient and multiple family members at bedside.  He is not currently on daily ASA, but is on Plavix with compliance.   Differential diagnosis includes CVA, TIA, arrhythmia, electrolyte abnormalities, muscle spasm.   Labs reviewed and found to be reassuring.  No leukocytosis, anemia, AKI, or electrolyte abnormalities.  Glucose 111. EKG reviewed and found to be reassuring.  Sinus rhythm, rate 57.  Normal axis and intervals.  No significant ST elevation or depression.  Imaging reviewed and found to be reassuring.  CT head did not show any acute intracranial hemorrhage or large territory infarction. Concern for possible TIA.  MRI obtained  to further evaluate for CVA/TIA. MRI brain reviewed and showed remote appearing infarctions, very similar to MRI in July.  No acute findings.  Recommended continuation of Plavix for recommended close follow-up with neurology, they will call their neurologist in the morning.  I discussed the above findings with the patient.  On my final evaluation, patient reports continued resolution of his symptoms.  All questions answered.  Strict return precautions given.          Final Clinical Impression(s) / ED Diagnoses Final diagnoses:  Left facial numbness    Rx / DC Orders ED Discharge Orders     None         Rosine Abe, MD 03/25/22 [id number]    Carmin Muskrat, MD 03/25/22 2351

## 2022-03-25 NOTE — Telephone Encounter (Signed)
[employer]- [employer] medic[employer] no auth req ref # Fe C on 03/25/22- pt chose.  Patient is scheduled at [facility] for 04/19/22 at 8 pm.  Mailed packet to the patient.

## 2022-03-25 NOTE — ED Provider Triage Note (Signed)
Emergency Medicine Provider Triage Evaluation Note  Edward Glass , a 66 y.o. male  was evaluated in triage.  Pt complains of facial numbness and slurred speech. Hx of CVA in 2020 with right sided residual weakness and numbness. Significant other had reported he seemed more confused the past several evenings. Daughter (at bedside) reports his speech sounded different than when she spoke to him last night. Patient reports having a headache this morning and then feeling his "whole body stiffen up", with some left sided facial tingling. He reports these symptoms have improved but he still doesn't feel right.   Review of Systems  Positive: Headache, slurred speech, facial numbness Negative: Syncope, visual disturbance  Physical Exam  BP (!) 144/88 (BP Location: Right Arm)   Pulse 61   Temp 97.9 F (36.6 C) (Oral)   Resp 16   Ht '5\' 4"'$  (1.626 m)   Wt 79.8 kg   SpO2 96%   BMI 30.21 kg/m  Gen:   Awake, no distress   Resp:  Normal effort  MSK:   Moves extremities without difficulty  Other:  Mild right sided facial droop, R arm weakness, R arm and leg numbness (patient reports consistent with residual symptoms)  No pronator drift  Tearful in triage, slurred speech Daughter reports speech is intermittently at his baseline  Medical Decision Making  Medically screening exam initiated at 2:41 PM.  Appropriate orders placed.  [name] was informed that the remainder of the evaluation will be completed by another provider, this initial triage assessment does not replace that evaluation, and the importance of remaining in the ED until their evaluation is complete.  Will order stroke workup but with intermittent symptoms that do not fall in specific window, will not be calling code stroke at this time   Kateri Plummer, PA-C 03/25/22 1448

## 2022-03-25 NOTE — ED Notes (Signed)
PA does not deem this at criteria for code stroke.

## 2022-03-25 NOTE — Discharge Instructions (Signed)
MRI reassuring. Continue current medications. Follow up with your neurologist.  Please call them tomorrow.  Return to the ED as needed.

## 2022-03-30 ENCOUNTER — Encounter: Payer: Self-pay | Admitting: Adult Health

## 2022-03-30 ENCOUNTER — Ambulatory Visit: Payer: Medicare Other | Admitting: Adult Health

## 2022-03-30 VITALS — BP 120/75 | HR 66 | Ht 65.0 in | Wt 176.1 lb

## 2022-03-30 DIAGNOSIS — Z9189 Other specified personal risk factors, not elsewhere classified: Secondary | ICD-10-CM | POA: Diagnosis not present

## 2022-03-30 DIAGNOSIS — R2990 Unspecified symptoms and signs involving the nervous system: Secondary | ICD-10-CM

## 2022-03-30 DIAGNOSIS — Z8673 Personal history of transient ischemic attack (TIA), and cerebral infarction without residual deficits: Secondary | ICD-10-CM | POA: Diagnosis not present

## 2022-03-30 NOTE — Patient Instructions (Signed)
Please schedule follow up with Dr. Letta Pate to discuss continued spasticity concerns  Continue clopidogrel 75 mg daily  and Crestor  for secondary stroke prevention  Continue to follow up with PCP regarding cholesterol and blood pressure management  Maintain strict control of hypertension with blood pressure goal below 130/90 and cholesterol with LDL cholesterol (bad cholesterol) goal below 70 mg/dL.   Signs of a Stroke? Follow the BEFAST method:  Balance Watch for a sudden loss of balance, trouble with coordination or vertigo Eyes Is there a sudden loss of vision in one or both eyes? Or double vision?  Face: Ask the person to smile. Does one side of the face droop or is it numb?  Arms: Ask the person to raise both arms. Does one arm drift downward? Is there weakness or numbness of a leg? Speech: Ask the person to repeat a simple phrase. Does the speech sound slurred/strange? Is the person confused ? Time: If you observe any of these signs, call 911.     Followup in the future with me in 6 months or call earlier if needed       Thank you for coming to see [place] at [facility]. I hope we have been able to provide you high quality care today.  You may receive a patient satisfaction survey over the next few weeks. We would appreciate your feedback and comments so that we may continue to improve ourselves and the health of our patients.

## 2022-03-30 NOTE — Progress Notes (Signed)
[facility] [address] [place]. [place] [address] [phone]       STROKE FOLLOW UP NOTE  Edward Glass Date of Birth:  Feb 25, 1956 Medical Record Number:  [id number]   Reason for Referral: Cryptogenic stroke follow up [place] provider: Dr. Leonie Glass PCP: Edward Mires, MD     CHIEF COMPLAINT:  Chief Complaint  Patient presents with   Follow-up stroke    Pt states going to ed for stiffness more than normal last week. He says he did not have a stroke. Pt reports feeling okay. He is asking to be taken off of Plavix for a few days. Room 2 alone     HPI:  Update 03/30/2022 JM: Patient returns for hospital follow-up.  He was evaluated in ED on 8/31 after presenting with left-sided headache lasting approximately 1 minute followed by intermittent left cheek numbness and LLE paresthesia lasting for approx 1 hour. Completed MRI brain which was negative for any findings. Per ED note, right sided deficits at baseline and no evidence of left-sided deficits.  No reoccurrence of left sided symptoms or headaches. No new stroke symptoms. Continued right-sided spasticity which he feels continues to worsen. Has been having more right sided hip pain, is considering doing PRP injection as this previously was beneficial.  Continued use of cane, denies any recent falls.  Continues to workout routinely.  Compliant on Crestor and Plavix.  Blood pressure 120/75.  Loop recorder has not shown atrial fibrillation thus far.  Evaluated by Dr. Rexene Glass 7/27 with plans on pursuing sleep study currently scheduled on 9/25  No further concerns at this time     History provided for reference purposes only Update 01/04/2022 JM: Patient returns for acute visit after calling on 6/7 reporting worsening right arm and leg weakness lasting a few minutes prior to returning to baseline which occurred approx 3 weeks ago and requesting an MRI brain to rule out new stroke.  He did not seek any emergent  evaluation at time or onset.  Denies any other associated symptoms such as headache, visual changes, dizziness or cognitive changes.  Reports since that time, he has had fluctuation of right leg heaviness sensation typically towards the end of the day or with increased activity.  He also notes fluctuation of speech over the past month.  Discussed concern of worsening spasticity at prior visit, referred to PT/OT for which he was evaluated for but did not have any additional sessions as advised.  He believes his spasticity has continued to worsen. He was seen by Dr. Letta Glass 6/8 who referred him to OT for eval of Vivistim for continued RUE deficits.  Was seen by PCP 6/7 and restarted on Prozac for worsening depression.  Reports compliance on Plavix and Crestor, denies side effects.  Blood pressure today 135/79.  Loop recorder has not shown atrial fibrillation thus far. He does note day time fatigue, not feeling refreshed upon awakening and having nocturia approx 5-6x per night.  Denies snoring, witnessed apneas or morning headaches.  No further concerns at this time.  Update 07/23/2021 JM: patient returns for 6 months stroke follow up. C/o right sided spasticity with slight increase in spasticity at times over the past couple of months, continues on baclofen 5 mg 3 times daily. Use of quad cane - no recent falls. Interested in doing additional therapy if able.  Dysarthria stable. No new stroke symptoms.  Compliant on Plavix and Crestor.  Blood pressure today 142/84.  Loop recorder has not shown  atrial fibrillation thus far.  No further concerns.  Update 01/15/2021 JM: Edward Glass returns for 59-monthstroke follow-up unaccompanied.  He has been stable from stroke standpoint without new stroke/TIA symptoms and residual right sided spasticity and dysarthria.  Continues to use quad cane and denies any recent falls.  Use of baclofen 5 mg 3 times daily with benefit.  Compliant on Plavix without associated side  effects.  He is concerned regarding possible statin myalgias as he will experience [name] pain with cramping shortly after taking atorvastatin.  Blood pressure today 137/81.  Loop recorder has not shown atrial fibrillation thus far.  Underwent right total shoulder arthroplasty 12/16/2020 by Dr. DMarlou Sawithout complication and recently started therapy.  No new concerns at this time.  Update 07/15/2020 JM: Mr. RLotzreturns for 665-monthtroke follow-up unaccompanied. 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 Has been using baclofen for spasticity '5mg'$  three times daily but does experience increased stiffness upon awakening and towards the end of the day. Feels as though speech has been improving. Denies new or worsening stroke/TIA symptoms. At prior visit, complained of right shoulder pain worsened post stroke. Evaluated by orthopedics with note personally reviewed and x-ray revealing severe end-stage OA with progression compared to prior imaging in 08/2018.  He has since received 2 injections with benefit.  Remains on Plavix and atorvastatin without side effects for secondary stroke prevention. Blood pressure today 127/77. Loop recorder has not shown atrial fibrillation thus far.  Previously on Prozac for depression/anxiety post stroke but he has since been able to discontinue this without difficulty.  No further concerns at this time.  Update 01/10/2020 JM: [name] for follow-up regarding stroke in 08/2018.  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  Denies new stroke/TIA symptoms.  Currently ambulating with a quad cane and denies any recent falls.  Reports typically caring cane at his side "just in case" but he is here to walk without it.  Initiated baclofen 5 mg twice daily as needed for spasticity.  Difficulty tolerating daytime dose due to increased fatigue has continued 5 mg dosage at night with only mild benefit.  He questions  benefit of restarting Botox as he only received 2 rounds.  He does report right shoulder pain which is chronic but worsened post stroke.  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  Continues on clopidogrel and atorvastatin for secondary stroke prevention.  Prior lipid panel on 10/09/2019 by PCP showed LDL 51.  Blood pressure today 122/77.  Loop recorder is not shown atrial fibrillation thus far.  No further concerns at this time.  Update 09/06/2019 JM: Mr. RoCardarellis a [age]ear old male who is being seen today for cryptogenic stroke follow-up.  He was previously seen via virtual visit on 11/08/2018 and has not previously followed up as recommended.  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  He continues to ambulate with a Rollator walker and denies any recent falls.  Greatest concern today is including continued right-sided spasticity.  Trial of Botox injections but denies benefit as well as trial of Zanaflex but patient self discontinued due to reported side effect of upset stomach.  Completed 3 months DAPT and continues on Plavix alone without bleeding or bruising.  Continues on atorvastatin without myalgias.  Blood pressure today 116/78.  Continues to follow with PCP regularly for HTN and HLD management/monitoring.  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  Loop recorder has not shown atrial fibrillation thus far.  Denies new or worsening stroke/TIA symptoms.  Initial visit via virtual 11/08/2018: He has been stable from a stroke standpoint with residual deficits of aphasia, cognitive deficits and right hemiparesis but does endorse improvement.  He continues to participate at neuro rehab PT/OT/ST along with continuing  exercises at home.  He is currently ambulating with a quad cane and denies any recent falls.  He does need some assistance with bathing and dressing but continues to be able to do more on his own each day.  He no longer has any difficulties with swallowing and is currently on a regular diet.  He does endorse increased salivation.  He continues on aspirin and Plavix without side effects of bleeding or bruising.  Continues on atorvastatin without side effects myalgias.  Blood pressures not routinely monitored at home and encourage daughter and patient to obtain cough to start monitoring.  He does endorse occasional depression difficulties such as feeling down or increased anxiety.  It was recommended to initiate Prozac during hospital admission but states once he went home, he did not continue as he felt it was not needed.  Per review of loop recorder, no report of atrial fibrillation found but per patient and daughter, they were contacted by cardiology stating arrhythmia had been found and is in the process of scheduling visit with cardiology.  No further concerns at this time.  Denies new or worsening stroke/TIA symptoms.  Stroke admission 08/30/2018: Mr. Bisping is a 66 year old male who presented [facility] ED with right-sided weakness and aphasia.   CT head reviewed which showed age indeterminate small vessel ischemia in the left BG.  CTA showed possible left M1 occlusion versus severe stenosis.  CT perfusion no infarct for but large penumbra.  MRI  brain reviewed and showed left MCA patchy infarcts mostly concentrated in the left BG. He arrived greater than 24h from time since last well, therefore no acute stroke interventions done.  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  He unfortunately had neurological worsening with severe aphasia and dense right hemiplegia with extension of deficits on 09/01/2018.  Since 2010 study's represented severe stenosis in this area,  other possibilities include artery to artery embolization.   When pts dtrs arrive, they both explain that in 2016 after tricep repair surgery they noted a possible arrhythmia and placed him on holter monitor for 30d, but was unrevealing.  2D echo showed an EF of 55 to 60%.  TCD bubble study negative for PFO.  Lower extremity venous Dopplers negative for DVT.  TEE showed small PFO/bubble crossover noted during Valsalva which was not felt to be clinically significant along with no evidence of cardiac thrombus therefore loop recorder placed.   Recommended DAPT for 3 months then Plavix alone due to severe left M1 stenosis.  HTN stable recommended long-term BP goal 1 30-1 50 due left M1 stenosis.  HLD 104 and initiated atorvastatin 40 mg daily.  Other stroke risk factors include family history of stroke but no personal history of stroke.  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  Initiated Prozac on  08/31/2018.  He had residual deficits of right hemiparesis, dysarthria and mixed aphasia and was discharged to [facility] for ongoing therapy.      ROS:   14 system review of systems performed and negative with exception of those listed in HPI  PMH:  Past Medical History:  Diagnosis Date   Arthritis    right shoulder   Atypical chest pain 08/10/2013   BPH (benign prostatic hyperplasia)    COVID-19 07/2020   CVA (cerebral vascular accident) ([place]) 08/2018   spastic hemiparesis of right dominant side   Dysphagia    Elevated aspartate aminotransferase level 06/16/2015   Excessive salivation 06/16/2015   H/O nutritional disorder 06/16/2015   H/O vitamin D deficiency    History of loop recorder 2020   Hx of adenomatous colonic polyps 2016   Hyperlipidemia    Hypertension    Hypogonadism male 08/21/2014   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   Screening for prostate cancer 08/21/2014    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    PSH:  Past Surgical History:  Procedure Laterality Date   COLONOSCOPY     HERNIA REPAIR  2006   left tricep surgery     LOOP RECORDER INSERTION N/A 09/04/2018   Procedure: LOOP RECORDER INSERTION;  Surgeon: Evans Lance, MD;  Location: [facility];  Service: Cardiovascular;  Laterality: N/A;   TEE WITHOUT CARDIOVERSION N/A 09/04/2018   Procedure: TRANSESOPHAGEAL ECHOCARDIOGRAM (TEE);  Surgeon: Jerline Pain, MD;  Location: [facility];  Service: Cardiovascular;  Laterality: N/A;   TOTAL SHOULDER ARTHROPLASTY Right 12/16/2020   Procedure: RIGHT TOTAL SHOULDER ARTHROPLASTY;  Surgeon: Meredith Pel, MD;  Location: [facility];  Service: Orthopedics;  Laterality: Right;    Social History:  Social History   Socioeconomic History   Marital status: Divorced    Spouse name: Not on file   Number of children: 2   Years of education: Not on file   Highest education level: Not on file  Occupational History   Occupation: Physiological scientist  Tobacco Use   Smoking status: Never   Smokeless tobacco: Never  Vaping Use   Vaping Use: Never used  Substance and Sexual Activity   Alcohol use: No   Drug use: No   Sexual activity: Not on file  Other Topics Concern   Not on file  Social History Narrative   Not on file   Social Determinants of Health   Financial Resource Strain: Low Risk  (09/05/2018)   Overall Financial Resource Strain (CARDIA)    Difficulty of Paying Living Expenses: Not hard at all  Food Insecurity: No Food Insecurity (09/05/2018)   Hunger Vital Sign    Worried About Running Out of Food in the Last Year: Never true    [place] in the Last Year: Never true  Transportation Needs: No Transportation Needs (09/05/2018)   PRAPARE - Hydrologist (Medical): No    Lack of Transportation (Non-Medical): No  Physical Activity: Sufficiently Active (09/05/2018)   Exercise Vital Sign    Days of Exercise  per Week: 6 days    Minutes of Exercise per Session: 30 min  Stress: No Stress Concern Present (09/05/2018)   [place]    Feeling of Stress : Not at all  Social Connections: Unknown (09/05/2018)   Social Connection and Isolation Panel [NHANES]    Frequency of Communication with Friends and Family: Three times a week    Frequency of  Social Gatherings with Friends and Family: Three times a week    Attends Religious Services: Not on file    Active Member of Clubs or Organizations: Yes    Attends Club or Organization Meetings: 1 to 4 times per year    Marital Status: Not on file  Intimate Partner Violence: Not At Risk (09/05/2018)   Humiliation, Afraid, Rape, and Kick questionnaire    Fear of Current or Ex-Partner: No    Emotionally Abused: No    Physically Abused: No    Sexually Abused: No    Family History:  Family History  Problem Relation Age of Onset   Kidney failure Mother    Cancer Mother    Prostate cancer Father    Cancer Father    Diabetes Father    Colon cancer Neg Hx    Esophageal cancer Neg Hx    Rectal cancer Neg Hx    Stomach cancer Neg Hx     Medications:   Current Outpatient Medications on File Prior to Visit  Medication Sig Dispense Refill   acetaminophen (TYLENOL) 325 MG tablet Take 2 tablets (650 mg total) by mouth every 4 (four) hours as needed for mild pain (or temp > 37.5 C (99.5 F)).     albuterol (VENTOLIN HFA) 108 (90 Base) MCG/ACT inhaler as needed for shortness of breath.     amLODipine (NORVASC) 5 MG tablet Take 5 mg by mouth daily.     Baclofen 5 MG TABS TAKE 1 TABLET BY MOUTH THREE TIMES A DAY 270 tablet 1   clopidogrel (PLAVIX) 75 MG tablet TAKE 1 TABLET BY MOUTH EVERY DAY 90 tablet 1   diclofenac Sodium (VOLTAREN) 1 % GEL Apply topically in the morning and at bedtime.     FLUoxetine (PROZAC) 20 MG capsule Take by mouth.     Multiple Vitamin (MULTIVITAMIN PO) Take by mouth daily.      multivitamin (ONE-A-DAY MEN'S) TABS tablet Take 1 tablet by mouth daily.     rosuvastatin (CRESTOR) 20 MG tablet TAKE 1 TABLET BY MOUTH EVERY DAY 90 tablet 3   No current facility-administered medications on file prior to visit.    Allergies:  No Known Allergies   Physical Exam  Vitals:   03/30/22 0836  BP: 120/75  Pulse: 66  Weight: 176 lb 2 oz (79.9 kg)  Height: '5\' 5"'$  (1.651 m)    Body mass index is 29.31 kg/m. No results found.   General: well developed, well nourished, pleasant middle-aged male, seated, in no evident distress Head: head normocephalic and atraumatic.   Neck: supple with no carotid or supraclavicular bruits Cardiovascular: regular rate and rhythm, no murmurs Musculoskeletal: limited right shoulder ROM Skin:  no rash/petichiae Vascular:  Normal pulses all extremities   Neurologic Exam Mental Status: Awake and fully alert. Mild dysarthria with hypophonia. Oriented to place and time. Recent and remote memory intact. Attention span, concentration and fund of knowledge appropriate. Mood and affect appropriate.  Cranial Nerves: Pupils equal, briskly reactive to light. Extraocular movements full without nystagmus. Visual fields full to confrontation. Hearing intact. Facial sensation intact. Mild right lower facial weakness when smiling.  tongue, palate moves normally and symmetrically.  Motor: Full strength left upper and lower extremity, very slight right arm and leg weakness distally compared to left side with increased tone arm>leg Sensory.: intact to touch , pinprick , position and vibratory sensation.  Coordination: Rapid alternating movements normal on left side. Finger-to-nose and heel-to-shin performed accurately on left side.  Unable  to adequately assess right side due to increased tone Gait and Station: Arises from chair without difficulty. Stance is normal. Gait demonstrates  slow hemiplegic gait and stiffened right leg with use of cane.  Tandem  walking heel toe not attempted Reflexes: 2+ RUE and [name] and 1+ left side. Toes downgoing.        ASSESSMENT/PLAN: Edward Glass is a 66 y.o. year old male here with left MCA patchy infarct secondary to left M1 occlusion with embolic pattern due to unclear etiology on 08/30/2018 and neurological worsening with extension of deficits on 09/01/2018.  Loop recorder placed which has not shown atrial fibrillation thus far.  Vascular risk factors include intracranial stenosis, HLD and HTN. Prior concerns of worsening right sided deficits, MR brain negative for new/acute abnormalities.  Possible TIA vs complicated migraine 07/8297 after presenting to ED with headache preceded by transient left facial and leg numbness with work up remarkable.     Transient left-sided symptoms and headache MR brain negative Possibly TIA vs complicated migraine No reoccurrence since that time  Cryptogenic left MCA infarct:  Residual RUE and [name] spasticity, gait impairment and dysarthria.  Continue to follow with Dr. Letta Glass PMR - advised to schedule f/u visit Continue clopidogrel 75 mg daily  and Crestor for secondary stroke prevention.  Loop recorder negative for atrial fibrillation thus far -continuously monitored by cardiology Discussed secondary stroke prevention measures and importance of close PCP follow-up for aggressive stroke risk factor management including BP goal<130/90, HLD with LDL goal<70 and DM with A1c.<7  Stroke labs 6/202:: LDL 53, A1c 6.0  At risk for sleep apnea: Scheduled for sleep study on 9/25     Follow up in 6 months or call earlier if needed   CC: Edward Mires, MD  I spent 31 minutes of face-to-face and non-face-to-face time with patient.  This included previsit chart review including review of recent ED visit, lab review, study review, order entry, electronic health record documentation, patient education and discussion regarding above diagnoses and treatment plan and answered  all the questions to patient satisfaction  Frann Rider, [facility]  [facility] [address] [place], [place] [address]  Phone [phone] Fax [phone] Note: This document was prepared with digital dictation and possible smart phrase technology. Any transcriptional errors that result from this process are unintentional.

## 2022-04-01 ENCOUNTER — Encounter: Payer: Self-pay | Admitting: Physical Medicine & Rehabilitation

## 2022-04-01 ENCOUNTER — Encounter: Payer: Medicare Other | Attending: Physical Medicine & Rehabilitation | Admitting: Physical Medicine & Rehabilitation

## 2022-04-01 VITALS — BP 130/80 | HR 62 | Ht 65.0 in | Wt 172.8 lb

## 2022-04-01 DIAGNOSIS — Z8673 Personal history of transient ischemic attack (TIA), and cerebral infarction without residual deficits: Secondary | ICD-10-CM | POA: Diagnosis not present

## 2022-04-01 DIAGNOSIS — G8111 Spastic hemiplegia affecting right dominant side: Secondary | ICD-10-CM | POA: Diagnosis present

## 2022-04-01 NOTE — Patient Instructions (Signed)
Would try increasing Baclofen to '10mg'$  3 x per dy  Will do Botox to pronator muscles

## 2022-04-01 NOTE — Progress Notes (Signed)
Subjective:    Patient ID: Edward Glass, male    DOB: [birth year], 66 y.o.   MRN: [id number]  HPI Recent ED visit for tightness on the right side of the body.  Patient was concerned he may have another stroke.  He was evaluated in the ED on 03/25/2022.  CT of the head was negative for new event, MRI of the brain was negative for new event. Patient states that at the time he experienced symptoms he was doing some weight lifting.  He described a tightness on the right side of his body more so in the arm than in the leg. We also discussed this tightness he does have a history of spasticity and has had Botox injections in the past.  Because of his weight training he really did not want to pursue these and has not had an injection for about 3 years.  Pain Inventory Average Pain 7 Pain Right Now 7 My pain is dull  In the last 24 hours, has pain interfered with the following? General activity 0 Relation with others 0 Enjoyment of life 0 What TIME of day is your pain at its worst? daytime Sleep (in general) Good  Pain is worse with: walking and standing Pain improves with: rest Relief from Meds:  on no meds  Family History  Problem Relation Age of Onset   Kidney failure Mother    Cancer Mother    Prostate cancer Father    Cancer Father    Diabetes Father    Colon cancer Neg Hx    Esophageal cancer Neg Hx    Rectal cancer Neg Hx    Stomach cancer Neg Hx    Social History   Socioeconomic History   Marital status: Divorced    Spouse name: Not on file   Number of children: 2   Years of education: Not on file   Highest education level: Not on file  Occupational History   Occupation: Physiological scientist  Tobacco Use   Smoking status: Never   Smokeless tobacco: Never  Vaping Use   Vaping Use: Never used  Substance and Sexual Activity   Alcohol use: No   Drug use: No   Sexual activity: Not on file  Other Topics Concern   Not on file  Social History Narrative   Not on file    Social Determinants of Health   Financial Resource Strain: Low Risk  (09/05/2018)   Overall Financial Resource Strain (CARDIA)    Difficulty of Paying Living Expenses: Not hard at all  Food Insecurity: No Food Insecurity (09/05/2018)   Hunger Vital Sign    Worried About Running Out of Food in the Last Year: Never true    Ran Out of Food in the Last Year: Never true  Transportation Needs: No Transportation Needs (09/05/2018)   PRAPARE - Hydrologist (Medical): No    Lack of Transportation (Non-Medical): No  Physical Activity: Sufficiently Active (09/05/2018)   Exercise Vital Sign    Days of Exercise per Week: 6 days    Minutes of Exercise per Session: 30 min  Stress: No Stress Concern Present (09/05/2018)   [place]    Feeling of Stress : Not at all  Social Connections: Unknown (09/05/2018)   Social Connection and Isolation Panel [NHANES]    Frequency of Communication with Friends and Family: Three times a week    Frequency of Social Gatherings with Friends and  Family: Three times a week    Attends Religious Services: Not on file    Active Member of Clubs or Organizations: Yes    Attends Club or Organization Meetings: 1 to 4 times per year    Marital Status: Not on file   Past Surgical History:  Procedure Laterality Date   COLONOSCOPY     HERNIA REPAIR  2006   left tricep surgery     LOOP RECORDER INSERTION N/A 09/04/2018   Procedure: LOOP RECORDER INSERTION;  Surgeon: Evans Lance, MD;  Location: [place] [facility];  Service: Cardiovascular;  Laterality: N/A;   TEE WITHOUT CARDIOVERSION N/A 09/04/2018   Procedure: TRANSESOPHAGEAL ECHOCARDIOGRAM (TEE);  Surgeon: Jerline Pain, MD;  Location: [facility] ENDOSCOPY;  Service: Cardiovascular;  Laterality: N/A;   TOTAL SHOULDER ARTHROPLASTY Right 12/16/2020   Procedure: RIGHT TOTAL SHOULDER ARTHROPLASTY;  Surgeon: Meredith Pel, MD;   Location: [place];  Service: Orthopedics;  Laterality: Right;   Past Surgical History:  Procedure Laterality Date   COLONOSCOPY     HERNIA REPAIR  2006   left tricep surgery     LOOP RECORDER INSERTION N/A 09/04/2018   Procedure: LOOP RECORDER INSERTION;  Surgeon: Evans Lance, MD;  Location: [place] [facility];  Service: Cardiovascular;  Laterality: N/A;   TEE WITHOUT CARDIOVERSION N/A 09/04/2018   Procedure: TRANSESOPHAGEAL ECHOCARDIOGRAM (TEE);  Surgeon: Jerline Pain, MD;  Location: [facility] ENDOSCOPY;  Service: Cardiovascular;  Laterality: N/A;   TOTAL SHOULDER ARTHROPLASTY Right 12/16/2020   Procedure: RIGHT TOTAL SHOULDER ARTHROPLASTY;  Surgeon: Meredith Pel, MD;  Location: [place];  Service: Orthopedics;  Laterality: Right;   Past Medical History:  Diagnosis Date   Arthritis    right shoulder   Atypical chest pain 08/10/2013   BPH (benign prostatic hyperplasia)    COVID-19 07/2020   CVA (cerebral vascular accident) ([place]) 08/2018   spastic hemiparesis of right dominant side   Dysphagia    Elevated aspartate aminotransferase level 06/16/2015   Excessive salivation 06/16/2015   H/O nutritional disorder 06/16/2015   H/O vitamin D deficiency    History of loop recorder 2020   Hx of adenomatous colonic polyps 2016   Hyperlipidemia    Hypertension    Hypogonadism male 08/21/2014   Leg varices 02/07/2012   Screening for prostate cancer 08/21/2014   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   BP 130/80   Pulse 62   Ht '5\' 5"'$  (1.651 m)   Wt 172 lb 12.8 oz (78.4 kg)   SpO2 96%   BMI 28.76 kg/m   Opioid Risk Score:   Fall Risk Score:  `1  Depression screen [facility] 2/9     04/01/2022   10:42 AM 12/31/2021   11:34 AM 11/08/2018    9:23 AM 10/10/2018    1:41 PM  Depression screen PHQ 2/9  Decreased Interest 0 1 0 1  Down, Depressed, Hopeless 0 '1 1 2  '$ PHQ - 2 Score 0 '2 1 3  '$ Altered sleeping    0  Tired, decreased energy    0  Change in appetite    0  Feeling bad or failure about  yourself     1  Trouble concentrating    0  Moving slowly or fidgety/restless    0  Suicidal thoughts    0  PHQ-9 Score    4  Difficult doing work/chores    Not difficult at all     Review of Systems  Constitutional: Negative.   HENT: Negative.  Eyes: Negative.   Respiratory: Negative.    Cardiovascular: Negative.   Gastrointestinal: Negative.   Endocrine: Negative.   Genitourinary: Negative.   Musculoskeletal:  Positive for gait problem.  Skin: Negative.   Allergic/Immunologic: Negative.   Hematological: Negative.   Psychiatric/Behavioral: Negative.        Objective:   Physical Exam Vitals and nursing note reviewed.  Constitutional:      Appearance: He is normal weight.  HENT:     Head: Normocephalic and atraumatic.  Eyes:     Extraocular Movements: Extraocular movements intact.     Conjunctiva/sclera: Conjunctivae normal.     Pupils: Pupils are equal, round, and reactive to light.  Skin:    General: Skin is warm and dry.  Neurological:     Mental Status: He is alert and oriented to person, place, and time.     Comments: Motor strength is 4 - at the right deltoid bicep tricep finger flexors and extensors. Motor strength is 4/5 in the right hip flexor 5 at the knee extensor and 4 at the ankle dorsiflexor Tone MAS 3 at the right biceps 3 at the right pronators 2 at the finger flexors 2 at the wrist flexors With ambulation patient goes into increased flexion at the elbow on the right side as well as wrist and finger flexion.   Psychiatric:        Mood and Affect: Mood normal.        Behavior: Behavior normal.           Assessment & Plan:  1.  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 We discussed the pros and cons of botulinum toxin injection.  I do think much of his current complaints are a result of spasticity.  We discussed increasing baclofen which is prescribed by neurology We  discussed that selecting muscle groups that are less likely to interfere with his weight training and grip strength would be desirable however he would have less spasticity relief overall.  Have decided on the following Botox/Xeomin to Right pronator teres and quadratus 100U total, will do next month  Increase baclofen to '10mg'$  TID

## 2022-04-05 ENCOUNTER — Ambulatory Visit (INDEPENDENT_AMBULATORY_CARE_PROVIDER_SITE_OTHER): Payer: Medicare Other

## 2022-04-05 DIAGNOSIS — I639 Cerebral infarction, unspecified: Secondary | ICD-10-CM

## 2022-04-05 NOTE — Progress Notes (Signed)
[facility] Summary Report / Loop Recorder 

## 2022-04-06 LAB — CUP PACEART REMOTE DEVICE CHECK
Date Time Interrogation Session: 20230906232845
Implantable Pulse Generator Implant Date: 20200210

## 2022-04-19 ENCOUNTER — Ambulatory Visit (INDEPENDENT_AMBULATORY_CARE_PROVIDER_SITE_OTHER): Payer: Medicare Other | Admitting: Neurology

## 2022-04-19 DIAGNOSIS — G4720 Circadian rhythm sleep disorder, unspecified type: Secondary | ICD-10-CM

## 2022-04-19 DIAGNOSIS — Z8673 Personal history of transient ischemic attack (TIA), and cerebral infarction without residual deficits: Secondary | ICD-10-CM

## 2022-04-19 DIAGNOSIS — E663 Overweight: Secondary | ICD-10-CM

## 2022-04-19 DIAGNOSIS — R0683 Snoring: Secondary | ICD-10-CM | POA: Diagnosis not present

## 2022-04-19 DIAGNOSIS — R351 Nocturia: Secondary | ICD-10-CM

## 2022-04-22 NOTE — Progress Notes (Signed)
[facility] Summary Report / Loop Recorder 

## 2022-04-26 NOTE — Procedures (Signed)
[facility] POLYSOMNOGRAPHY  INTERPRETATION REPORT   STUDY DATE:  04/19/2022     PATIENT NAME:  Edward Glass         DATE OF BIRTH:  18-Nov-1955  PATIENT ID:  [id number]    TYPE OF STUDY:  PSG  READING PHYSICIAN: Star Age, MD REFERRED BY: Frann Rider, NP SCORING TECHNICIAN: Richard Miu, RPSGT   HISTORY: 66 year old male with a history of stroke, vitamin D deficiency, hypertension, hyperlipidemia, hypogonadism, chest pain, arthritis, status post loop recorder placement, and borderline obesity, who reports rare snoring and?significant nocturia. ?His Epworth sleepiness score is 3 out of 24, fatigue severity score is 9 out of 63. ?  Height: 65 in Weight: 177 lb (BMI 29) Neck Size: 15 in  MEDICATIONS: Tylenol, Ventolin, Norvasc, Baclofen, Plavix, Voltaren, Prozac, Multivitamin, Crestor TECHNICAL DESCRIPTION: A registered sleep technologist was in attendance for the duration of the recording.  Data collection, scoring, video monitoring, and reporting were performed in compliance with the AASM Manual for the Scoring of Sleep and Associated Events; (Hypopnea is scored based on the criteria listed in Section VIII D. 1b in the AASM Manual V2.6 using a 4% oxygen desaturation rule or Hypopnea is scored based on the criteria listed in Section VIII D. 1a in the AASM Manual V2.6 using 3% oxygen desaturation and /or arousal rule).   SLEEP CONTINUITY AND SLEEP ARCHITECTURE:  Lights-out was at 20:50: and lights-on at  05:03: for a total recording time of 8 hours and 13.5 minutes. Total sleep time ( TST) was 336.0 minutes with a decreased sleep efficiency at 68.1%.   BODY POSITION:  TST was divided  between the following sleep positions: 100.0% supine;  0.0% lateral;  0% prone. Duration of total sleep and percent of total sleep in their respective position is as follows: supine 336 minutes (100%), non-supine 0 minutes (0%); right 00 minutes (0%), left 00 minutes (0%), and  prone 00 minutes (0%).  Total supine REM sleep time was 69 minutes (100% of total REM sleep). Sleep latency was increased at 58.0 minutes.  REM sleep latency was markedly increased at 199.0 minutes. Of the total sleep time, the percentage of stage N1 sleep was 5.5%, stage N2 sleep was 52%, which is normal, stage N3 sleep was 21.7%, and REM sleep was 20.5%, which is normal. sWake after sleep onset (WASO) time accounted for 99.5 minutes with mild to moderate sleep fragmentation noted.  RESPIRATORY MONITORING:   Based on CMS criteria (using a 4% oxygen desaturation rule for scoring hypopneas), there were 1 apneas (1 obstructive; 0 central; 0 mixed), and 25 hypopneas.  Apnea index was 0.2. Hypopnea index was 4.5. The apnea-hypopnea index was 4.6/hour overall (4.6 supine, 0 non-supine; 9.6 REM, 9.6 supine REM).  There were 0 respiratory effort-related arousals (RERAs).  The RERA index was 0 events/h. Total respiratory disturbance index (RDI) was 4.6 events/h. RDI results showed: supine RDI  4.6 /h; non-supine RDI 0.0 /h; REM RDI 9.6 /h, supine REM RDI 9.6 /h.   Based on AASM criteria (using a 3% oxygen desaturation and /or arousal rule for scoring hypopneas), there were 1 apneas (1 obstructive; 0 central; 0 mixed), and 27 hypopneas. Apnea index was 0.2. Hypopnea index was 4.8. The apnea-hypopnea index was 5.0 overall (5.0 supine, 0 non-supine; 10.4 REM, 10.4 supine REM).  There were 0 respiratory effort-related arousals (RERAs).  The RERA index was 0 events/h. Total respiratory disturbance index (RDI) was 5.0 events/h. RDI results showed: supine RDI  5.0 /  h; non-supine RDI 0.0 /h; REM RDI 10.4 /h, supine REM RDI 10.4 /h.  OXIMETRY: Oxyhemoglobin Saturation Nadir during sleep was at 84%) from a mean of 93%.  Of the Total sleep time (TST)   hypoxemia (=<88%) was present for  15.6 minutes, or 4.6% of total sleep time.  LIMB MOVEMENTS: There were 0 periodic limb movements of sleep (0.0/hr), of which 0 (0.0/hr) were  associated with an arousal. AROUSAL: There were 35 arousals in total, for an arousal index of 6 arousals/hour.  Of these, 8 were identified as respiratory-related arousals (1 /h), 0 were PLM-related arousals (0 /h), and 38 were non-specific arousals (7 /h).   Snoring was classified as mild and intermittent. EEG: The EEG was of normal amplitude and frequency, with symmetric?manifestation of?sleep?stages. EKG: The electrocardiogram?showed normal sinus rhythm.?The average heart rate during sleep was 58 bpm.  The heart rate during sleep varied between a minimum of Tachycardia and  a maximum of  74 bpm. AUDIO and VIDEO: The video and audio analysis did not show any abnormal or unusual behaviors, movements, phonations or vocalizations. The patient took 3 restroom breaks; he requested to use a urinal. Post study, the patient indicated, that sleep was the same as usual. IMPRESSION:  1.?Primary Snoring  2.?Dysfunctions associated with sleep stages or arousal from sleep RECOMMENDATIONS: 1.?This study does not demonstrate any significant obstructive or central sleep disordered breathing with an AHI of less than 5/hour. His total AHI was 4.6/h, O2 nadir 84%. Intermittent mild snoring was detected. Treatment with a positive airway pressure device, such as CPAP or autoPAP is not indicated. 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 2.?This study shows some sleep fragmentation but otherwise normal sleep stage percentages; these are nonspecific findings and per se do not signify an intrinsic sleep disorder or a cause for the patient's sleep-related symptoms. Causes include (but are not limited to) the first night effect of the sleep study, circadian rhythm disturbances, medication effect or an underlying mood disorder or medical problem.  3.?The patient should be cautioned not to drive, work at heights, or operate dangerous or heavy  equipment when tired or sleepy. Review and reiteration of good sleep hygiene measures should be pursued with any patient. 4.?The patient will be advised to follow up with the referring provider, who will be notified of the test results. ? I certify that I have reviewed the entire raw data recording prior to the issuance of this report in accordance with the Standards of Accreditation of the American Academy of Sleep Medicine (AASM). Star Age, MD, PhD Diplomat, ABPN (Neurology and Sleep)

## 2022-04-27 ENCOUNTER — Telehealth: Payer: Self-pay

## 2022-04-27 NOTE — Telephone Encounter (Signed)
I called patient.  I discussed his sleep study results and recommendations.  Patient will follow-up with Janett Billow, NP as scheduled.  Patient verbalized understanding of results and had no further questions or concerns at this time.

## 2022-04-27 NOTE — Telephone Encounter (Signed)
-----   Message from Star Age, MD sent at 04/26/2022  7:10 PM EDT ----- Patient referred by Frann Rider, NP, seen by me on 02/18/22, diagnostic PSG on 04/19/22.   Please call and notify the patient that the recent sleep study did not show any significant obstructive sleep apnea. His total AHI was 4.6/h, O2 nadir 84%. Intermittent mild snoring was detected. Treatment with a positive airway pressure device, such as CPAP or autoPAP is not indicated. 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  At this juncture, he can followup with his providers as scheduled/planned.  Thanks,  Star Age, MD, PhD [facility])

## 2022-05-06 ENCOUNTER — Ambulatory Visit: Payer: Medicare Other | Admitting: Adult Health

## 2022-05-06 LAB — CUP PACEART REMOTE DEVICE CHECK
Date Time Interrogation Session: 20231009233433
Implantable Pulse Generator Implant Date: 20200210

## 2022-05-10 ENCOUNTER — Ambulatory Visit (INDEPENDENT_AMBULATORY_CARE_PROVIDER_SITE_OTHER): Payer: Medicare Other

## 2022-05-10 DIAGNOSIS — I639 Cerebral infarction, unspecified: Secondary | ICD-10-CM | POA: Diagnosis not present

## 2022-05-20 ENCOUNTER — Encounter: Payer: Medicare Other | Attending: Physical Medicine & Rehabilitation | Admitting: Physical Medicine & Rehabilitation

## 2022-05-20 ENCOUNTER — Encounter: Payer: Self-pay | Admitting: Physical Medicine & Rehabilitation

## 2022-05-20 VITALS — BP 118/76 | HR 67 | Ht 65.0 in | Wt 170.0 lb

## 2022-05-20 DIAGNOSIS — G8111 Spastic hemiplegia affecting right dominant side: Secondary | ICD-10-CM | POA: Insufficient documentation

## 2022-05-20 MED ORDER — INCOBOTULINUMTOXINA 200 UNITS IM SOLR: 100.0000 [IU] | Freq: Once | INTRAMUSCULAR | Status: DC

## 2022-05-20 NOTE — Progress Notes (Signed)
Xeomin Injection for spasticity using needle EMG guidance  Dilution: 50 Units/ml Indication: Severe spasticity which interferes with ADL,mobility and/or  hygiene and is unresponsive to medication management and other conservative care Informed consent was obtained after describing risks and benefits of the procedure with the patient. This includes bleeding, bruising, infection, excessive weakness, or medication side effects. A REMS form is on file and signed. Needle: 27g 1" needle electrode Number of units per muscle  RIGHT Pronator teres 50U Pronator Quad 50U All injections were done after obtaining appropriate EMG activity , Estim activity and after negative drawback for blood. The patient tolerated the procedure well. Post procedure instructions were given. A followup appointment was made.

## 2022-05-20 NOTE — Patient Instructions (Signed)
Injected pronators today   You received a Xeomin injection today. You may experience soreness at the needle injection sites. Please call us if any of the injection sites turns red after a couple days or if there is any drainage. You may experience muscle weakness as a result of Xeomin This would improve with time but can take several weeks to improve. The Xeomin should start working in about one week. The Xeomin usually last 3 months. The injection can be repeated every 3 months as needed.

## 2022-06-03 NOTE — Progress Notes (Signed)
[facility] Summary Report / Loop Recorder 

## 2022-06-14 ENCOUNTER — Telehealth: Payer: Self-pay

## 2022-06-14 ENCOUNTER — Ambulatory Visit (INDEPENDENT_AMBULATORY_CARE_PROVIDER_SITE_OTHER): Payer: Medicare Other

## 2022-06-14 DIAGNOSIS — I639 Cerebral infarction, unspecified: Secondary | ICD-10-CM | POA: Diagnosis not present

## 2022-06-14 NOTE — Telephone Encounter (Signed)
ILR @ RRT 06/13/22.   Patient advised ILR @ RRT 06/13/22. Discussed options to leave device in or explant. Patient would like to have device explanted and has recall in. Advised scheduling will contact him for apt and bring remote monitor in when he comes in for apt. Patient voiced understanding.

## 2022-06-15 LAB — CUP PACEART REMOTE DEVICE CHECK
Date Time Interrogation Session: 20231120000500
Implantable Pulse Generator Implant Date: 20200210

## 2022-07-02 ENCOUNTER — Encounter: Payer: Medicare Other | Admitting: Physical Medicine & Rehabilitation

## 2022-07-09 ENCOUNTER — Ambulatory Visit: Payer: Medicare Other | Attending: Internal Medicine | Admitting: Internal Medicine

## 2022-07-09 ENCOUNTER — Encounter: Payer: Self-pay | Admitting: Internal Medicine

## 2022-07-09 VITALS — BP 124/72 | HR 61 | Ht 65.0 in | Wt 174.0 lb

## 2022-07-09 DIAGNOSIS — I639 Cerebral infarction, unspecified: Secondary | ICD-10-CM | POA: Diagnosis not present

## 2022-07-09 DIAGNOSIS — E785 Hyperlipidemia, unspecified: Secondary | ICD-10-CM

## 2022-07-09 NOTE — Progress Notes (Signed)
HPI [name] returns today for followup of a cryptogenic stroke. He is a pleasant 66 yo man with a cryptogenic stroke, s/p ILR insertion. He has undergone rehab. He has weakness in the right arm and has undergone shoulder replacement surgery by Dr. Marlou Sa. he denies chest pain or sob. He is exercising again but somewhat limited. He presents today for removal of his ILR which has reached end of service. Most recent ILR transmission demonstrated no evidence of atrial fib.  No Known Allergies   Current Outpatient Medications  Medication Sig Dispense Refill   acetaminophen (TYLENOL) 325 MG tablet Take 2 tablets (650 mg total) by mouth every 4 (four) hours as needed for mild pain (or temp > 37.5 C (99.5 F)).     albuterol (VENTOLIN HFA) 108 (90 Base) MCG/ACT inhaler as needed for shortness of breath.     amLODipine (NORVASC) 5 MG tablet Take 5 mg by mouth daily.     Baclofen 5 MG TABS TAKE 1 TABLET BY MOUTH THREE TIMES A DAY 270 tablet 1   clopidogrel (PLAVIX) 75 MG tablet TAKE 1 TABLET BY MOUTH EVERY DAY 90 tablet 1   diclofenac Sodium (VOLTAREN) 1 % GEL Apply topically in the morning and at bedtime.     FLUoxetine (PROZAC) 20 MG capsule Take by mouth.     Multiple Vitamin (MULTIVITAMIN PO) Take by mouth daily.     multivitamin (ONE-A-DAY MEN'S) TABS tablet Take 1 tablet by mouth daily.     rosuvastatin (CRESTOR) 20 MG tablet TAKE 1 TABLET BY MOUTH EVERY DAY 90 tablet 3   Current Facility-Administered Medications  Medication Dose Route Frequency Provider Last Rate Last Admin   IncobotulinumtoxinA SOLR 100 Units  100 Units Intramuscular Once [name], MD         Past Medical History:  Diagnosis Date   Arthritis    right shoulder   Atypical chest pain 08/10/2013   BPH (benign prostatic hyperplasia)    COVID-19 07/2020   CVA (cerebral vascular accident) ([place]) 08/2018   spastic hemiparesis of right dominant side   Dysphagia    Elevated aspartate aminotransferase level  06/16/2015   Excessive salivation 06/16/2015   H/O nutritional disorder 06/16/2015   H/O vitamin D deficiency    History of loop recorder 2020   Hx of adenomatous colonic polyps 2016   Hyperlipidemia    Hypertension    Hypogonadism male 08/21/2014   Leg varices 02/07/2012   Screening for prostate cancer 08/21/2014   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   Past Surgical History:  Procedure Laterality Date   COLONOSCOPY     HERNIA REPAIR  2006   left tricep surgery     LOOP RECORDER INSERTION N/A 09/04/2018   Procedure: LOOP RECORDER INSERTION;  Surgeon: Evans Lance, MD;  Location: [place] [facility];  Service: Cardiovascular;  Laterality: N/A;   TEE WITHOUT CARDIOVERSION N/A 09/04/2018   Procedure: TRANSESOPHAGEAL ECHOCARDIOGRAM (TEE);  Surgeon: Jerline Pain, MD;  Location: [facility] ENDOSCOPY;  Service: Cardiovascular;  Laterality: N/A;   TOTAL SHOULDER ARTHROPLASTY Right 12/16/2020   Procedure: RIGHT TOTAL SHOULDER ARTHROPLASTY;  Surgeon: Meredith Pel, MD;  Location: [place];  Service: Orthopedics;  Laterality: Right;     Family History  Problem Relation Age of Onset   Kidney failure Mother    Cancer Mother    Prostate cancer Father  Cancer Father    Diabetes Father    Colon cancer Neg Hx    Esophageal cancer Neg Hx    Rectal cancer Neg Hx    Stomach cancer Neg Hx      Social History   Socioeconomic History   Marital status: Divorced    Spouse name: Not on file   Number of children: 2   Years of education: Not on file   Highest education level: Not on file  Occupational History   Occupation: Physiological scientist  Tobacco Use   Smoking status: Never   Smokeless tobacco: Never  Vaping Use   Vaping Use: Never used  Substance and Sexual Activity   Alcohol use: No   Drug use: No   Sexual activity: Not on file  Other Topics Concern   Not on file  Social History Narrative   Not on file    Social Determinants of Health   Financial Resource Strain: Low Risk  (09/05/2018)   Overall Financial Resource Strain (CARDIA)    Difficulty of Paying Living Expenses: Not hard at all  Food Insecurity: No Food Insecurity (09/05/2018)   Hunger Vital Sign    Worried About Running Out of Food in the Last Year: Never true    [place] in the Last Year: Never true  Transportation Needs: No Transportation Needs (09/05/2018)   PRAPARE - Hydrologist (Medical): No    Lack of Transportation (Non-Medical): No  Physical Activity: Sufficiently Active (09/05/2018)   Exercise Vital Sign    Days of Exercise per Week: 6 days    Minutes of Exercise per Session: 30 min  Stress: No Stress Concern Present (09/05/2018)   [place]    Feeling of Stress : Not at all  Social Connections: Unknown (09/05/2018)   Social Connection and Isolation Panel [NHANES]    Frequency of Communication with Friends and Family: Three times a week    Frequency of Social Gatherings with Friends and Family: Three times a week    Attends Religious Services: Not on file    Active Member of Clubs or Organizations: Yes    Attends Club or Organization Meetings: 1 to 4 times per year    Marital Status: Not on file  Intimate Partner Violence: Not At Risk (09/05/2018)   Humiliation, Afraid, Rape, and Kick questionnaire    Fear of Current or Ex-Partner: No    Emotionally Abused: No    Physically Abused: No    Sexually Abused: No     There were no vitals taken for this visit.  Physical Exam:  Well appearing NAD HEENT: Unremarkable Neck:  No JVD, no thyromegally Lymphatics:  No adenopathy Back:  No CVA tenderness Lungs:  Clear HEART:  Regular rate rhythm, no murmurs, no rubs, no clicks Abd:  soft, positive bowel sounds, no organomegally, no rebound, no guarding Ext:  2 plus pulses, no edema, no cyanosis, no clubbing Skin:  No  rashes no nodules Neuro:  CN II through XII intact, motor grossly intact  DEVICE  Normal device function.  See PaceArt for details. RRT  Assess/Plan:  Cryptogenic stroke - he has not had any arrhythmias noted on his ILR. He will continue plavix. Dyslipidemia - he will continue crestor.   Edward Glass  EP procedure Note  Procedure Performed: ILR removal  Preoperative diagnosis: cryptogenic stroke s/p ILR insertion with the device at RRT.  Postop diagnosis: same as preop  Description of the procedure: after informed consent was obtained, the patient was prepped and draped in a sterile fashion. 10 cc of lidocaine was infiltrated. A one cm stab incision was carried out. A combination of blunt and sharp dissection was used to dissect down to the device and it was removed with gentle traction. Benzoin and steristrips were painted on the skin and a bandage applied and the patient recovered in the usual manner.  Complications: none immediately  Conclusion: successful ILR removal  Edward Glass

## 2022-07-09 NOTE — Patient Instructions (Addendum)
Medication Instructions:  Your physician recommends that you continue on your current medications as directed. Please refer to the Current Medication list given to you today.  Labwork: None ordered.  Testing/Procedures: None ordered.  Follow-Up:  Your physician wants you to follow-up in: AS NEEDED with Dr. Lovena Le or APP.  You will receive a reminder letter in the mail two months in advance. If you don't receive a letter, please call our office to schedule the follow-up appointment.    Implantable Loop Recorder Removal, Care After This sheet gives you information about how to care for yourself after your procedure. Your health care provider may also give you more specific instructions. If you have problems or questions, contact your health care provider. What can I expect after the procedure? After the procedure, it is common to have: Soreness or discomfort near the incision. Some swelling or bruising near the incision.  Follow these instructions at home: Incision care  Monitor your cardiac device site for redness, swelling, and drainage. Call the device clinic at [phone] if you experience these symptoms or fever/chills.  Keep the large square bandage on your site for 24 hours and then you may remove it yourself. Keep the steri-strips underneath in place.   You may shower after 72 hours / 3 days from your procedure with the steri-strips in place. They will usually fall off on their own, or may be removed after 10 days. Pat dry.   Avoid lotions, ointments, or perfumes over your incision until it is well-healed.  Please do not submerge in water until your site is completely healed.   If your wound site starts to bleed apply pressure.       If you have any questions/concerns please call the device clinic at [phone].  Activity  Return to your normal activities.  Contact a health care provider if: You have redness, swelling, or pain around your incision. You have a  fever.

## 2022-07-23 NOTE — Progress Notes (Signed)
[facility] Summary Report / Loop Recorder 

## 2022-07-26 ENCOUNTER — Other Ambulatory Visit: Payer: Self-pay | Admitting: Adult Health

## 2022-08-19 ENCOUNTER — Other Ambulatory Visit: Payer: Self-pay | Admitting: Adult Health

## 2022-09-27 NOTE — Progress Notes (Unsigned)
[facility] [address] [place]. [place] [address] [phone]       STROKE FOLLOW UP NOTE  [name] Date of Birth:  1955/09/25 Medical Record Number:  [id number]   Reason for Referral: Cryptogenic stroke follow up [place] provider: Dr. Leonie Glass PCP: Edward Mires, MD     CHIEF COMPLAINT:  No chief complaint on file.    HPI:  Update 09/28/2022 JM: Patient returns for 68-monthstroke follow-up.  Has been stable without any new stroke/TIA symptoms.  Right spastic hemiparesis ***.   Compliant on Crestor and Plavix.  Blood pressure well-controlled.  Loop recorder reached end of service 05/2022 without evidence of A-fib during monitoring, has this removed by Dr. TLovena Glass in December.  Completed sleep study which did not show any significant sleep apnea requiring treatment.       History provided for reference purposes only Update 03/30/2022 JM: Patient returns for hospital follow-up.  He was evaluated in ED on 8/31 after presenting with left-sided headache lasting approximately 1 minute followed by intermittent left cheek numbness and LLE paresthesia lasting for approx 1 hour. Completed MRI brain which was negative for any findings. Per ED note, right sided deficits at baseline and no evidence of left-sided deficits.  No reoccurrence of left sided symptoms or headaches. No new stroke symptoms. Continued right-sided spasticity which he feels continues to worsen. Has been having more right sided hip pain, is considering doing PRP injection as this previously was beneficial.  Continued use of cane, denies any recent falls.  Continues to workout routinely.  Compliant on Crestor and Plavix.  Blood pressure 120/75.  Loop recorder has not shown atrial fibrillation thus far.  Evaluated by Dr. ARexene Alberts7/27 with plans on pursuing sleep study currently scheduled on 9/25  No further concerns at this time  Update 68/06/2022 JM: Patient returns for acute visit after  calling on 6/7 reporting worsening right arm and leg weakness lasting a few minutes prior to returning to baseline which occurred approx 3 weeks ago and requesting an MRI brain to rule out new stroke.  He did not seek any emergent evaluation at time or onset.  Denies any other associated symptoms such as headache, visual changes, dizziness or cognitive changes.  Reports since that time, he has had fluctuation of right leg heaviness sensation typically towards the end of the day or with increased activity.  He also notes fluctuation of speech over the past month.  Discussed concern of worsening spasticity at prior visit, referred to PT/OT for which he was evaluated for but did not have any additional sessions as advised.  He believes his spasticity has continued to worsen. He was seen by Dr. KLetta Pate6/8 who referred him to OT for eval of Vivistim for continued RUE deficits.  Was seen by PCP 6/7 and restarted on Prozac for worsening depression.  Reports compliance on Plavix and Crestor, denies side effects.  Blood pressure today 135/79.  Loop recorder has not shown atrial fibrillation thus far. He does note day time fatigue, not feeling refreshed upon awakening and having nocturia approx 5-6x per night.  Denies snoring, witnessed apneas or morning headaches.  No further concerns at this time.  Update 07/24/67/2022 JM: patient returns for 6 months stroke follow up. C/o right sided spasticity with slight increase in spasticity at times over the past couple of months, continues on baclofen 5 mg 3 times daily. Use of quad cane - no recent falls. Interested in doing additional therapy if  able.  Dysarthria stable. No new stroke symptoms.  Compliant on Plavix and Crestor.  Blood pressure today 142/84.  Loop recorder has not shown atrial fibrillation thus far.  No further concerns.  Update 01/15/2021 JM: Edward Glass returns for 68-monthstroke follow-up unaccompanied.  He has been stable from stroke standpoint without new  stroke/TIA symptoms and residual right sided spasticity and dysarthria.  Continues to use quad cane and denies any recent falls.  Use of baclofen 5 mg 3 times daily with benefit.  Compliant on Plavix without associated side effects.  He is concerned regarding possible statin myalgias as he will experience [name] pain with cramping shortly after taking atorvastatin.  Blood pressure today 137/81.  Loop recorder has not shown atrial fibrillation thus far.  Underwent right total shoulder arthroplasty 12/16/2020 by Dr. DMarlou Sawithout complication and recently started therapy.  No new concerns at this time.  Update 68/21/2021 JM: Mr. RGriscomreturns for 659-monthtroke follow-up unaccompanied. 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 Has been using baclofen for spasticity '5mg'$  three times daily but does experience increased stiffness upon awakening and towards the end of the day. Feels as though speech has been improving. Denies new or worsening stroke/TIA symptoms. At prior visit, complained of right shoulder pain worsened post stroke. Evaluated by orthopedics with note personally reviewed and x-ray revealing severe end-stage OA with progression compared to prior imaging in 08/2018.  He has since received 2 injections with benefit.  Remains on Plavix and atorvastatin without side effects for secondary stroke prevention. Blood pressure today 127/77. Loop recorder has not shown atrial fibrillation thus far.  Previously on Prozac for depression/anxiety post stroke but he has since been able to discontinue this without difficulty.  No further concerns at this time.  Update 68/17/2021 JM: Mr. RoKempeleturns for follow-up regarding stroke in 08/2018.  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  Denies new stroke/TIA symptoms.  Currently ambulating with a quad cane and denies any recent falls.  Reports typically caring cane at his side "just in case" but he  is here to walk without it.  Initiated baclofen 5 mg twice daily as needed for spasticity.  Difficulty tolerating daytime dose due to increased fatigue has continued 5 mg dosage at night with only mild benefit.  He questions benefit of restarting Botox as he only received 2 rounds.  He does report right shoulder pain which is chronic but worsened post stroke.  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  Continues on clopidogrel and atorvastatin for secondary stroke prevention.  Prior lipid panel on 10/09/2019 by PCP showed LDL 51.  Blood pressure today 122/77.  Loop recorder is not shown atrial fibrillation thus far.  No further concerns at this time.  Update 09/06/2019 JM: Mr. RoCogdills a [age]ear old male who is being seen today for cryptogenic stroke follow-up.  He was previously seen via virtual visit on 11/08/2018 and has not previously followed up as recommended.  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  He continues to ambulate with a Rollator walker and denies any recent falls.  Greatest concern today is including continued right-sided spasticity.  Trial of Botox injections but denies benefit as well as trial of Zanaflex but  patient self discontinued due to reported side effect of upset stomach.  Completed 3 months DAPT and continues on Plavix alone without bleeding or bruising.  Continues on atorvastatin without myalgias.  Blood pressure today 116/78.  Continues to follow with PCP regularly for HTN and HLD management/monitoring.  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  Loop recorder has not shown atrial fibrillation thus far.  Denies new or worsening stroke/TIA symptoms.  Initial visit via virtual 11/08/2018: He has  been stable from a stroke standpoint with residual deficits of aphasia, cognitive deficits and right hemiparesis but does endorse improvement.  He continues to participate at neuro rehab PT/OT/ST along with continuing exercises at home.  He is currently ambulating with a quad cane and denies any recent falls.  He does need some assistance with bathing and dressing but continues to be able to do more on his own each day.  He no longer has any difficulties with swallowing and is currently on a regular diet.  He does endorse increased salivation.  He continues on aspirin and Plavix without side effects of bleeding or bruising.  Continues on atorvastatin without side effects myalgias.  Blood pressures not routinely monitored at home and encourage daughter and patient to obtain cough to start monitoring.  He does endorse occasional depression difficulties such as feeling down or increased anxiety.  It was recommended to initiate Prozac during hospital admission but states once he went home, he did not continue as he felt it was not needed.  Per review of loop recorder, no report of atrial fibrillation found but per patient and daughter, they were contacted by cardiology stating arrhythmia had been found and is in the process of scheduling visit with cardiology.  No further concerns at this time.  Denies new or worsening stroke/TIA symptoms.  Stroke admission 08/30/2018: Mr. Rudis is a 67 year old male who presented to [facility] ED with right-sided weakness and aphasia.   CT head reviewed which showed age indeterminate small vessel ischemia in the left BG.  CTA showed possible left M1 occlusion versus severe stenosis.  CT perfusion no infarct for but large penumbra.  MRI  brain reviewed and showed left MCA patchy infarcts mostly concentrated in the left BG. He arrived greater than 24h from time since last well, therefore no acute stroke interventions done.  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  He unfortunately had neurological worsening with severe aphasia and dense right hemiplegia with extension of deficits on 09/01/2018.  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   When pts dtrs arrive, they both explain that in 2016 after tricep repair surgery they noted a possible arrhythmia and placed him on holter monitor for 30d, but was unrevealing.  2D echo showed an EF of 55 to 60%.  TCD bubble study negative for PFO.  Lower extremity venous Dopplers negative for DVT.  TEE showed small PFO/bubble crossover noted during Valsalva which was not felt to be clinically significant along with no evidence of cardiac thrombus therefore loop recorder placed.   Recommended DAPT for 3 months then Plavix alone due to severe left M1 stenosis.  HTN stable recommended long-term BP goal 1 30-1 50 due left M1 stenosis.  HLD 104 and initiated atorvastatin 40 mg daily.  Other stroke risk factors include family history of stroke but no personal history of stroke.  Other active problems including reactive  depression as he previously worked as a Wellsite geologist and overall healthy and was devastated with diagnosis and residual deficits.  Initiated Prozac on 08/31/2018.  He had residual deficits of right hemiparesis, dysarthria and mixed aphasia and was discharged to [facility] for ongoing therapy.      ROS:   14 system review of systems performed and negative with exception of those listed in HPI  PMH:  Past Medical History:  Diagnosis Date   Arthritis    right shoulder   Atypical chest pain 08/10/2013   BPH (benign prostatic hyperplasia)    COVID-19 07/2020   CVA (cerebral vascular accident) ([place]) 08/2018   spastic hemiparesis of right dominant side   Dysphagia    Elevated aspartate aminotransferase level 06/16/2015   Excessive salivation 06/16/2015   H/O nutritional disorder 06/16/2015   H/O vitamin D deficiency     History of loop recorder 2020   Hx of adenomatous colonic polyps 2016   Hyperlipidemia    Hypertension    Hypogonadism male 08/21/2014   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   Screening for prostate cancer 08/21/2014   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    PSH:  Past Surgical History:  Procedure Laterality Date   COLONOSCOPY     HERNIA REPAIR  2006   left tricep surgery     LOOP RECORDER INSERTION N/A 09/04/2018   Procedure: LOOP RECORDER INSERTION;  Surgeon: Evans Lance, MD;  Location: [place] CV LAB;  Service: Cardiovascular;  Laterality: N/A;   TEE WITHOUT CARDIOVERSION N/A 09/04/2018   Procedure: TRANSESOPHAGEAL ECHOCARDIOGRAM (TEE);  Surgeon: Jerline Pain, MD;  Location: [facility] ENDOSCOPY;  Service: Cardiovascular;  Laterality: N/A;   TOTAL SHOULDER ARTHROPLASTY Right 12/16/2020   Procedure: RIGHT TOTAL SHOULDER ARTHROPLASTY;  Surgeon: Meredith Pel, MD;  Location: [place];  Service: Orthopedics;  Laterality: Right;    Social History:  Social History   Socioeconomic History   Marital status: Divorced    Spouse name: Not on file   Number of children: 2   Years of education: Not on file   Highest education level: Not on file  Occupational History   Occupation: Physiological scientist  Tobacco Use   Smoking status: Never   Smokeless tobacco: Never  Vaping Use   Vaping Use: Never used  Substance and Sexual Activity   Alcohol use: No   Drug use: No   Sexual activity: Not on file  Other Topics Concern   Not on file  Social History Narrative   Not on file   Social Determinants of Health   Financial Resource Strain: Low Risk  (09/05/2018)   Overall Financial Resource Strain (CARDIA)    Difficulty of Paying Living Expenses: Not hard at all  Food Insecurity: No Food Insecurity (09/05/2018)   Hunger Vital Sign    Worried About Running Out of Food in the Last Year: Never true    [place] in the Last Year: Never true  Transportation Needs: No Transportation Needs (09/05/2018)    PRAPARE - Hydrologist (Medical): No    Lack of Transportation (Non-Medical): No  Physical Activity: Sufficiently Active (09/05/2018)   Exercise Vital Sign    Days of Exercise per Week: 6 days    Minutes of Exercise per Session: 30 min  Stress: No Stress Concern Present (09/05/2018)   [place]    Feeling of Stress : Not at all  Social Connections: Unknown (09/05/2018)  Social Connection and Isolation Panel [NHANES]    Frequency of Communication with Friends and Family: Three times a week    Frequency of Social Gatherings with Friends and Family: Three times a week    Attends Religious Services: Not on file    Active Member of Clubs or Organizations: Yes    Attends Club or Organization Meetings: 1 to 4 times per year    Marital Status: Not on file  Intimate Partner Violence: Not At Risk (09/05/2018)   Humiliation, Afraid, Rape, and Kick questionnaire    Fear of Current or Ex-Partner: No    Emotionally Abused: No    Physically Abused: No    Sexually Abused: No    Family History:  Family History  Problem Relation Age of Onset   Kidney failure Mother    Cancer Mother    Prostate cancer Father    Cancer Father    Diabetes Father    Colon cancer Neg Hx    Esophageal cancer Neg Hx    Rectal cancer Neg Hx    Stomach cancer Neg Hx     Medications:   Current Outpatient Medications on File Prior to Visit  Medication Sig Dispense Refill   acetaminophen (TYLENOL) 325 MG tablet Take 2 tablets (650 mg total) by mouth every 4 (four) hours as needed for mild pain (or temp > 37.5 C (99.5 F)).     albuterol (VENTOLIN HFA) 108 (90 Base) MCG/ACT inhaler as needed for shortness of breath.     amLODipine (NORVASC) 5 MG tablet Take 5 mg by mouth daily.     Baclofen 5 MG TABS TAKE 1 TABLET BY MOUTH THREE TIMES A DAY 270 tablet 1   clopidogrel (PLAVIX) 75 MG tablet TAKE 1 TABLET BY MOUTH EVERY DAY 90  tablet 1   diclofenac Sodium (VOLTAREN) 1 % GEL Apply topically in the morning and at bedtime.     FLUoxetine (PROZAC) 20 MG capsule Take by mouth.     Multiple Vitamin (MULTIVITAMIN PO) Take by mouth daily.     multivitamin (ONE-A-DAY MEN'S) TABS tablet Take 1 tablet by mouth daily.     rosuvastatin (CRESTOR) 20 MG tablet TAKE 1 TABLET BY MOUTH EVERY DAY 90 tablet 3   Current Facility-Administered Medications on File Prior to Visit  Medication Dose Route Frequency Provider Last Rate Last Admin   IncobotulinumtoxinA SOLR 100 Units  100 Units Intramuscular Once Kirsteins, Luanna Salk, MD        Allergies:  No Known Allergies   Physical Exam  There were no vitals filed for this visit.   There is no height or weight on file to calculate BMI. No results found.   General: well developed, well nourished, pleasant middle-aged male, seated, in no evident distress Head: head normocephalic and atraumatic.   Neck: supple with no carotid or supraclavicular bruits Cardiovascular: regular rate and rhythm, no murmurs Musculoskeletal: limited right shoulder ROM Skin:  no rash/petichiae Vascular:  Normal pulses all extremities   Neurologic Exam Mental Status: Awake and fully alert. Mild dysarthria with hypophonia. Oriented to place and time. Recent and remote memory intact. Attention span, concentration and fund of knowledge appropriate. Mood and affect appropriate.  Cranial Nerves: Pupils equal, briskly reactive to light. Extraocular movements full without nystagmus. Visual fields full to confrontation. Hearing intact. Facial sensation intact. Mild right lower facial weakness when smiling.  tongue, palate moves normally and symmetrically.  Motor: Full strength left upper and lower extremity, very slight right arm and leg  weakness distally compared to left side with increased tone arm>leg Sensory.: intact to touch , pinprick , position and vibratory sensation.  Coordination: Rapid alternating  movements normal on left side. Finger-to-nose and heel-to-shin performed accurately on left side.  Unable to adequately assess right side due to increased tone Gait and Station: Arises from chair without difficulty. Stance is normal. Gait demonstrates  slow hemiplegic gait and stiffened right leg with use of cane.  Tandem walking heel toe not attempted Reflexes: 2+ RUE and [name] and 1+ left side. Toes downgoing.        ASSESSMENT/PLAN: DAUGHTRY DOBEK is a 67 y.o. year old male here with left MCA patchy infarct secondary to left M1 occlusion with embolic pattern due to unclear etiology on 08/30/2018 and neurological worsening with extension of deficits on 09/01/2018.  Loop recorder placed which has not shown atrial fibrillation thus far.  Vascular risk factors include intracranial stenosis, HLD and HTN. Prior concerns of worsening right sided deficits, MR brain negative for new/acute abnormalities.  Possible TIA vs complicated migraine [id number] after presenting to ED with headache preceded by transient left facial and leg numbness with work up remarkable.     Transient left-sided symptoms and headache MR brain negative Possibly TIA vs complicated migraine No reoccurrence since that time  Cryptogenic left MCA infarct:  Residual RUE and [name] spasticity, gait impairment and dysarthria.  Continue to follow with Dr. Letta Glass PMR - advised to schedule f/u visit Continue clopidogrel 75 mg daily  and Crestor for secondary stroke prevention.  Loop recorder negative for atrial fibrillation thus far -continuously monitored by cardiology Discussed secondary stroke prevention measures and importance of close PCP follow-up for aggressive stroke risk factor management including BP goal<130/90, HLD with LDL goal<70 and DM with A1c.<7  Stroke labs 6/202:: LDL 53, A1c 6.0  At risk for sleep apnea: Scheduled for sleep study on 9/25     Follow up in 6 months or call earlier if needed   CC: Edward Mires,  MD  I spent 31 minutes of face-to-face and non-face-to-face time with patient.  This included previsit chart review including review of recent ED visit, lab review, study review, order entry, electronic health record documentation, patient education and discussion regarding above diagnoses and treatment plan and answered all the questions to patient satisfaction  Frann Rider, [facility]  [facility] [address] [place], [place] [address]  Phone [phone] Fax [phone] Note: This document was prepared with digital dictation and possible smart phrase technology. Any transcriptional errors that result from this process are unintentional.

## 2022-09-28 ENCOUNTER — Ambulatory Visit: Payer: Medicare Other | Admitting: Adult Health

## 2022-09-28 ENCOUNTER — Encounter: Payer: Self-pay | Admitting: Adult Health

## 2022-09-28 VITALS — BP 113/64 | HR 66 | Ht 65.0 in | Wt 173.6 lb

## 2022-09-28 DIAGNOSIS — E785 Hyperlipidemia, unspecified: Secondary | ICD-10-CM

## 2022-09-28 DIAGNOSIS — G8111 Spastic hemiplegia affecting right dominant side: Secondary | ICD-10-CM | POA: Diagnosis not present

## 2022-09-28 DIAGNOSIS — Z8673 Personal history of transient ischemic attack (TIA), and cerebral infarction without residual deficits: Secondary | ICD-10-CM | POA: Diagnosis not present

## 2022-09-28 DIAGNOSIS — R7309 Other abnormal glucose: Secondary | ICD-10-CM

## 2022-09-28 DIAGNOSIS — G44209 Tension-type headache, unspecified, not intractable: Secondary | ICD-10-CM

## 2022-09-28 MED ORDER — BACLOFEN 10 MG PO TABS: 10.0000 mg | ORAL_TABLET | Freq: Three times a day (TID) | ORAL | 0 refills | Status: DC

## 2022-09-28 MED ORDER — TOPIRAMATE 50 MG PO TABS: 50.0000 mg | ORAL_TABLET | Freq: Two times a day (BID) | ORAL | 5 refills | Status: DC

## 2022-09-28 NOTE — Patient Instructions (Addendum)
Start topamax '50mg'$  nightly for headache prevention and pain - if no benefit after a couple weeks, please let me know for dosage increase or sooner if difficulty tolerating   Increase baclofen gradually to '10mg'$  three times daily for spasticity - please let me know after a couple weeks if no benefit   Continue clopidogrel 75 mg daily for secondary stroke prevention  Repeat lipid panel today - based on results, will make decision about lowering Crestor dosage vs  changing to a different statin to see if this helps  Continue to follow up with PCP regarding blood pressure and cholesterol management  Maintain strict control of hypertension with blood pressure goal below 130/90 and cholesterol with LDL cholesterol (bad cholesterol) goal below 70 mg/dL.   Signs of a Stroke? Follow the BEFAST method:  Balance Watch for a sudden loss of balance, trouble with coordination or vertigo Eyes Is there a sudden loss of vision in one or both eyes? Or double vision?  Face: Ask the person to smile. Does one side of the face droop or is it numb?  Arms: Ask the person to raise both arms. Does one arm drift downward? Is there weakness or numbness of a leg? Speech: Ask the person to repeat a simple phrase. Does the speech sound slurred/strange? Is the person confused ? Time: If you observe any of these signs, call 911.    Followup in the future with me in 6 months or call earlier if needed       Thank you for coming to see [place] at [facility]. I hope we have been able to provide you high quality care today.  You may receive a patient satisfaction survey over the next few weeks. We would appreciate your feedback and comments so that we may continue to improve ourselves and the health of our patients.

## 2022-09-29 ENCOUNTER — Other Ambulatory Visit: Payer: Self-pay

## 2022-09-29 ENCOUNTER — Other Ambulatory Visit: Payer: Self-pay | Admitting: Adult Health

## 2022-09-29 LAB — LIPID PANEL
Chol/HDL Ratio: 2.2 ratio (ref 0.0–5.0)
Cholesterol, Total: 132 mg/dL (ref 100–199)
HDL: 60 mg/dL (ref >39)
LDL Chol Calc (NIH): 55 mg/dL (ref 0–99)
Triglycerides: 89 mg/dL (ref 0–149)
VLDL Cholesterol Cal: 17 mg/dL (ref 5–40)

## 2022-09-29 LAB — HEMOGLOBIN A1C
Est. average glucose Bld gHb Est-mCnc: 117 mg/dL
Hgb A1c MFr Bld: 5.7 % — ABNORMAL HIGH (ref 4.8–5.6)

## 2022-09-29 MED ORDER — TOPIRAMATE 50 MG PO TABS: 50.0000 mg | ORAL_TABLET | Freq: Two times a day (BID) | ORAL | 5 refills | Status: DC

## 2022-09-29 MED ORDER — ROSUVASTATIN CALCIUM 10 MG PO TABS: 10.0000 mg | ORAL_TABLET | Freq: Every day | ORAL | 3 refills | Status: DC

## 2022-11-10 DIAGNOSIS — G43909 Migraine, unspecified, not intractable, without status migrainosus: Secondary | ICD-10-CM | POA: Insufficient documentation

## 2022-11-13 ENCOUNTER — Other Ambulatory Visit: Payer: Self-pay | Admitting: Adult Health

## 2022-12-06 ENCOUNTER — Telehealth: Payer: Self-pay | Admitting: Adult Health

## 2022-12-06 NOTE — Telephone Encounter (Signed)
Called the patient back. Pt states that he was having RUE/[name] stiffness. This has not progressed into the LLE as well. At march visit Shanda Bumps increased baclofen to 10mg  TID and it has not helped with stiffness.  He also suffers with headaches, Topiramate was started but he had side effects and stopped it. He states that he is currently not working with PT. Advised will discuss with Dr Pearlean Brownie about the patient increased stiffness and headache and see what he would recommend. The patient was concerned that he possibly could have had another stroke. He denies any new symptoms just states the stiffness is worse and headaches are still present.

## 2022-12-06 NOTE — Telephone Encounter (Signed)
Pt called and LVM stating that he is getting stiffer by the day and is wanting to discuss with MD or RN. Pleas advise.

## 2022-12-08 MED ORDER — TIZANIDINE HCL 2 MG PO TABS: ORAL_TABLET | ORAL | 0 refills | Status: DC

## 2022-12-08 NOTE — Telephone Encounter (Signed)
Called the patient and advised that Dr Pearlean Brownie would recommend that the patient try an alternative medication tizanidine 2 mg. Advised to start by taking 1 tab at bedtime for a week and may increase to two a day if tolerates well. Advised that he should not use baclofen   Per Dr Pearlean Brownie, "We can try Zanaflex 2 mg at night for 1 week to be increased to twice daily and thereafter as tolerated"

## 2022-12-30 ENCOUNTER — Other Ambulatory Visit: Payer: Self-pay | Admitting: Neurology

## 2023-01-17 ENCOUNTER — Other Ambulatory Visit: Payer: Self-pay | Admitting: Adult Health

## 2023-02-10 NOTE — Progress Notes (Signed)
HPI :  67 y/o male with a history of CVA in 2020 on Plavix, history of colon polyps, here for a follow up visit. Last seen in 10/2020.     The patient's last colonoscopy was in December 2016, at which time he had 2 less than 1 cm adenomas removed and internal hemorrhoids.  At that time based on national guidelines it was recommended he have a follow-up colonoscopy in 5 years. Since national guidelines were updated his surveillance was lengthened to 7 years. He is now due for surveillance colonoscopy.  He denies any problems with his bowel habits, regular stools without any blood.  No routine abdominal pains.  Weight is stable.  No family history of colon cancer.   He had a stroke in 2020 for which he was admitted and underwent a work-up.  He walks with a cane for ambulation but has recovered okay. He is on Plavix indefinitely in this light and has remained on it. He feels well at baseline. Denies any cardiopulmonary symptoms. Echo most recently done Jan 2023 without any significant findings.      Colonoscopy 07/18/2015 - COLON FINDINGS: The bowel preparation was only fair on intubation but cleared with adequate views following lavage. The colon was extremely tortous. There was a 4mm sessile polyp in the cecum removed with cold snare. There was a 7-103mm sessile polyp in the ascending colon removed with cold snare. The remainder of the colon was normal. Retroflexed views revealed internal hemorrhoids.    Path c/w adenomas, now due 06/2022   Echo 09/04/18 - TEE - EF 55-60%, valves okay    Echo 08/17/21 - EF 60-65%   Past Medical History:  Diagnosis Date   Arthritis    right shoulder   Atypical chest pain 08/10/2013   BPH (benign prostatic hyperplasia)    COVID-19 07/2020   CVA (cerebral vascular accident) (HCC) 08/2018   spastic hemiparesis of right dominant side   Dysphagia    Elevated aspartate aminotransferase level 06/16/2015   Excessive salivation 06/16/2015   H/O nutritional  disorder 06/16/2015   H/O vitamin D deficiency    History of loop recorder 2020   Hx of adenomatous colonic polyps 2016   Hyperlipidemia    Hypertension    Hypogonadism male 08/21/2014   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   Screening for prostate cancer 08/21/2014   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     Past Surgical History:  Procedure Laterality Date   COLONOSCOPY     HERNIA REPAIR  2006   left tricep surgery     LOOP RECORDER INSERTION N/A 09/04/2018   Procedure: LOOP RECORDER INSERTION;  Surgeon: Marinus Maw, MD;  Location: [facility];  Service: Cardiovascular;  Laterality: N/A;   TEE WITHOUT CARDIOVERSION N/A 09/04/2018   Procedure: TRANSESOPHAGEAL ECHOCARDIOGRAM (TEE);  Surgeon: Jake Bathe, MD;  Location: [facility] ENDOSCOPY;  Service: Cardiovascular;  Laterality: N/A;   TOTAL SHOULDER ARTHROPLASTY Right 12/16/2020   Procedure: RIGHT TOTAL SHOULDER ARTHROPLASTY;  Surgeon: Cammy Copa, MD;  Location: [facility] OR;  Service: Orthopedics;  Laterality: Right;   Family History  Problem Relation Age of Onset   Kidney failure Mother    Cancer Mother    Prostate cancer Father    Cancer Father    Diabetes Father    Colon cancer Neg Hx    Esophageal cancer Neg Hx    Rectal cancer Neg Hx    Stomach cancer Neg Hx    Social History   Tobacco Use  Smoking status: Never   Smokeless tobacco: Never  Vaping Use   Vaping status: Never Used  Substance Use Topics   Alcohol use: No   Drug use: No   Current Outpatient Medications  Medication Sig Dispense Refill   acetaminophen (TYLENOL) 325 MG tablet Take 2 tablets (650 mg total) by mouth every 4 (four) hours as needed for mild pain (or temp > 37.5 C (99.5 F)).     albuterol (VENTOLIN HFA) 108 (90 Base) MCG/ACT inhaler as needed for shortness of breath.     amLODipine (NORVASC) 5 MG tablet Take 5 mg by mouth daily.     baclofen (LIORESAL) 10 MG tablet Take 1 tablet (10 mg total) by mouth 3 (three) times daily. 30 each 0   clopidogrel  (PLAVIX) 75 MG tablet TAKE 1 TABLET BY MOUTH EVERY DAY 90 tablet 1   diclofenac Sodium (VOLTAREN) 1 % GEL Apply topically in the morning and at bedtime.     FLUoxetine (PROZAC) 20 MG capsule Take by mouth.     Multiple Vitamin (MULTIVITAMIN PO) Take by mouth daily.     multivitamin (ONE-A-DAY MEN'S) TABS tablet Take 1 tablet by mouth daily.     rosuvastatin (CRESTOR) 10 MG tablet Take 1 tablet (10 mg total) by mouth daily. 90 tablet 3   tiZANidine (ZANAFLEX) 2 MG tablet TAKE 1 TABLET BY MOUTH AT BEDTIME FOR 7 DAYS, THEN 1 TABLET 2 TIMES DAILY FOR 21 DAYS. 150 tablet 1   Current Facility-Administered Medications  Medication Dose Route Frequency Provider Last Rate Last Admin   IncobotulinumtoxinA SOLR 100 Units  100 Units Intramuscular Once Kirsteins, Victorino Sparrow, MD       No Known Allergies   Review of Systems: All systems reviewed and negative except where noted in HPI.   Lab Results  Component Value Date   WBC 6.7 03/25/2022   HGB 16.7 03/25/2022   HCT 49.0 03/25/2022   MCV 90.7 03/25/2022   PLT 326 03/25/2022     Physical Exam: BP 128/72   Pulse 83   Ht 5\' 5"  (1.651 m)   Wt 172 lb (78 kg)   SpO2 99%   BMI 28.62 kg/m  Constitutional: Pleasant,well-developed, male in no acute distress. Psychiatric: Normal mood and affect. Behavior is normal.   ASSESSMENT: 67 y.o. male here for assessment of the following  1. History of colon polyps   2. Antiplatelet or antithrombotic long-term use    Last colonoscopy in 2016 with some adenomas removed, he is due for a surveillance colonoscopy at this time. No anemia, no symptoms that bother him, echo looks good, he feels well. We discussed colonoscopy, risks / benefits of the exam and anesthesia and he wants to proceed. We will need approval to hold his Plavix for 5 days for an exam, we will reach out to his Neurologist to see if cleared for this. He understands this and reasons to do so. All questions answered, further recommendations  pending the results.  PLAN: - colonoscopy at the [facility]. Will need approval to hold Plavix for 5 days - [facility] Austin of Neurology  Harlin Rain, MD [facility]

## 2023-02-11 ENCOUNTER — Telehealth: Payer: Self-pay

## 2023-02-11 ENCOUNTER — Other Ambulatory Visit: Payer: Self-pay | Admitting: Gastroenterology

## 2023-02-11 ENCOUNTER — Ambulatory Visit (INDEPENDENT_AMBULATORY_CARE_PROVIDER_SITE_OTHER): Payer: Medicare Other | Admitting: Gastroenterology

## 2023-02-11 ENCOUNTER — Encounter: Payer: Self-pay | Admitting: Gastroenterology

## 2023-02-11 VITALS — BP 128/72 | HR 83 | Ht 65.0 in | Wt 172.0 lb

## 2023-02-11 DIAGNOSIS — Z86010 Personal history of colonic polyps: Secondary | ICD-10-CM

## 2023-02-11 DIAGNOSIS — Z7902 Long term (current) use of antithrombotics/antiplatelets: Secondary | ICD-10-CM | POA: Diagnosis not present

## 2023-02-11 MED ORDER — PLENVU 140 G PO SOLR: 1.0000 | Freq: Once | ORAL | 0 refills | Status: AC

## 2023-02-11 NOTE — Telephone Encounter (Signed)
   Edward Glass 01/09/1956 [id number]  Dear Shanda Bumps McCue:  We have scheduled the above named patient for a(n) COLONOSCOPY procedure. Our records show that (s)he is on anticoagulation therapy.  Please advise as to whether the patient may come off their therapy of PLAVIX  5 days prior to their procedure which is scheduled for 03-25-23.  Please route your response to Berlinda Last, CMA or fax response to [phone]. Thank you.  Sincerely,    [facility]

## 2023-02-11 NOTE — Patient Instructions (Signed)
You have been scheduled for a colonoscopy. Please follow written instructions given to you at your visit today.   Please pick up your prep supplies at the pharmacy within the next 1-3 days.  If you use inhalers (even only as needed), please bring them with you on the day of your procedure.  DO NOT TAKE 7 DAYS PRIOR TO TEST- Trulicity (dulaglutide) Ozempic, Wegovy (semaglutide) Mounjaro (tirzepatide) Bydureon Bcise (exanatide extended release)  DO NOT TAKE 1 DAY PRIOR TO YOUR TEST Rybelsus (semaglutide) Adlyxin (lixisenatide) Victoza (liraglutide) Byetta (exanatide) ___________________________________________________________________________  Bonita Quin will be contacted by our office prior to your procedure for directions on holding your Plavix.  If you do not hear from our office 1 week prior to your scheduled procedure, please call [phone] to discuss.   Thank you for entrusting me with your care and for choosing [facility], Dr. Ileene Patrick    If your blood pressure at your visit was 140/90 or greater, please contact your primary care physician to follow up on this. ______________________________________________________  If you are age 52 or older, your body mass index should be between 23-30. Your Body mass index is 28.62 kg/m. If this is out of the aforementioned range listed, please consider follow up with your Primary Care Provider.  If you are age 42 or younger, your body mass index should be between 19-25. Your Body mass index is 28.62 kg/m. If this is out of the aformentioned range listed, please consider follow up with your Primary Care Provider.  ________________________________________________________  The [facility] providers would like to encourage you to use [facility] to communicate with providers for non-urgent requests or questions.  Due to long hold times on the telephone, sending your provider a message by [facility] may be a faster and more efficient way  to get a response.  Please allow 48 business hours for a response.  Please remember that this is for non-urgent requests.  _______________________________________________________  Due to recent changes in healthcare laws, you may see the results of your imaging and laboratory studies on MyChart before your provider has had a chance to review them.  We understand that in some cases there may be results that are confusing or concerning to you. Not all laboratory results come back in the same time frame and the provider may be waiting for multiple results in order to interpret others.  Please give [place] 48 hours in order for your provider to thoroughly review all the results before contacting the office for clarification of your results.

## 2023-02-14 NOTE — Telephone Encounter (Signed)
Edward Glass has a history of left MCA stroke in 08/2018 and possible TIA vs complicated migraine in 02/2022. He has been stable from a neurological standpoint since that time without any new stroke/TIAs or symptoms.  Okay to proceed with colonoscopy with holding Plavix for 5 days as requested with small but acceptable risk of recurrent stroke while off therapy and recommend restarting immediately after or once hemodynamically stable.

## 2023-02-14 NOTE — Telephone Encounter (Signed)
Patient is returning your call.  

## 2023-02-14 NOTE — Telephone Encounter (Signed)
Called and spoke to patient.  He understands to hold Plavix starting on Aug 25th.

## 2023-02-14 NOTE — Telephone Encounter (Signed)
Called and left message for patient to call back to discuss holding Plavix for procedure with Dr. Adela Lank

## 2023-02-15 NOTE — Telephone Encounter (Signed)
Inbound call from patient requesting a call to discuss prep medication. Stated his out of pocket cost is $159. Please advise, thank you.

## 2023-02-15 NOTE — Telephone Encounter (Signed)
Called patient. Let him know [facility] the medicare coupon codes they can run to lower the price.  I asked him to call me if he has any issues and I will help him get it cheaper or see if I can get a sample.

## 2023-02-17 ENCOUNTER — Ambulatory Visit (INDEPENDENT_AMBULATORY_CARE_PROVIDER_SITE_OTHER): Payer: Medicare Other | Admitting: Orthopedic Surgery

## 2023-02-17 ENCOUNTER — Other Ambulatory Visit (INDEPENDENT_AMBULATORY_CARE_PROVIDER_SITE_OTHER): Payer: Medicare Other

## 2023-02-17 DIAGNOSIS — M542 Cervicalgia: Secondary | ICD-10-CM

## 2023-02-17 NOTE — Progress Notes (Signed)
Orthopedic Spine Surgery Office Note  Assessment: Patient is a 67 y.o. male with neck popping and clicking but no pain.  No radicular pain.  No symptoms of myelopathy   Plan: -Patient has tried activity modification -Recommended use heat to the neck and can work on gentle range of motion.  I offered him PT but he is not interested at this time -Patient should return to office on an as-needed basis  Patient expressed understanding of the plan and all questions were answered to the patient's satisfaction.   ___________________________________________________________________________   History:  Patient is a 67 y.o. male who presents today for cervical spine.  Patient has had about 8 months of neck popping and clicking.  There is no trauma or injury that preceded the onset of this clicking or popping.  He is not having any neck pain.  No pain rating into either upper extremity.  Notices it mostly when turning his head from side-to-side.  Of note, has had difficulty with abduction at the shoulder since shoulder replacement surgery  Weakness: Denies Difficulty with fine motor skills (e.g., buttoning shirts, handwriting): Denies Symptoms of imbalance: Denies Paresthesias and numbness: Denies Bowel or bladder incontinence: Denies Saddle anesthesia: Denies  Treatments tried: Activity modification  Review of systems: Denies fevers and chills, night sweats, unexplained weight loss, history of cancer, pain that wakes them at night  Past medical history: BPH CVA with right spastic hemiparesis HLD HTN  Allergies: NKDA  Past surgical history:  Hernia repair Left triceps tendon repair Right TSA  Social history: Denies use of nicotine product (smoking, vaping, patches, smokeless) Alcohol use: denies Denies recreational drug use   Physical Exam:  BMI of 28.6  General: no acute distress, appears stated age Neurologic: alert, answering questions appropriately, following  commands Respiratory: unlabored breathing on room air, symmetric chest rise Psychiatric: appropriate affect, normal cadence to speech   MSK (spine):  -Strength exam      Left  Right Grip strength                5/5  5/5 Interosseus   5/5   5/5 Wrist extension  5/5  5/5 Wrist flexion   5/5  5/5 Elbow flexion   5/5  5/5 Deltoid    5/5  5/5   Is able to abduct the right shoulder to about 40 degrees with good strength but cannot elevate it further  -Sensory exam    Sensation intact to light touch in C5-T1 nerve distributions of bilateral upper extremities  -Brachioradialis DTR: 2/4 on the left, 2/4 on the right -Biceps DTR: 2/4 on the left, 2/4 on the right  -Spurling: Negative bilaterally -Hoffman sign: Negative bilaterally -Clonus: No beats bilaterally -Interosseous wasting: None seen -Gait: Ambulates with cane  Imaging: XR of the cervical spine from 02/17/2023 was independently reviewed and interpreted, showing disc height loss at C5/6 and C6/7. No evidence of instability on flexion/extension views. No fracture or dislocation seen. Neutral alignment.    Patient name: Edward Glass Patient MRN: [id number] Date of visit: 02/17/23

## 2023-02-21 NOTE — Telephone Encounter (Addendum)
Patient called requesting to speak with you regarding [facility]

## 2023-02-21 NOTE — Telephone Encounter (Signed)
Call[facility] [place].  They processed the coupon codes which brought the price down to $60. It is ready for pick up. Called patient back and let him know.  He expressed understanding.

## 2023-03-15 ENCOUNTER — Encounter: Payer: Self-pay | Admitting: Gastroenterology

## 2023-03-20 ENCOUNTER — Encounter: Payer: Self-pay | Admitting: Certified Registered Nurse Anesthetist

## 2023-03-25 ENCOUNTER — Ambulatory Visit (AMBULATORY_SURGERY_CENTER): Payer: Medicare Other | Admitting: Gastroenterology

## 2023-03-25 ENCOUNTER — Encounter: Payer: Self-pay | Admitting: Gastroenterology

## 2023-03-25 VITALS — BP 159/98 | HR 53 | Temp 98.0°F | Resp 17 | Ht 65.0 in | Wt 172.0 lb

## 2023-03-25 DIAGNOSIS — Z86010 Personal history of colonic polyps: Secondary | ICD-10-CM | POA: Diagnosis not present

## 2023-03-25 DIAGNOSIS — Z09 Encounter for follow-up examination after completed treatment for conditions other than malignant neoplasm: Secondary | ICD-10-CM | POA: Diagnosis present

## 2023-03-25 DIAGNOSIS — D123 Benign neoplasm of transverse colon: Secondary | ICD-10-CM | POA: Diagnosis not present

## 2023-03-25 MED ORDER — SODIUM CHLORIDE 0.9 % IV SOLN: 500.0000 mL | Freq: Once | INTRAVENOUS | Status: DC

## 2023-03-25 NOTE — Patient Instructions (Signed)
Resume previous diet and medications. Awaiting pathology results. Resume Plavix tomorrow 03/26/23  YOU HAD AN ENDOSCOPIC PROCEDURE TODAY AT THE  ENDOSCOPY CENTER:   Refer to the procedure report that was given to you for any specific questions about what was found during the examination.  If the procedure report does not answer your questions, please call your gastroenterologist to clarify.  If you requested that your care partner not be given the details of your procedure findings, then the procedure report has been included in a sealed envelope for you to review at your convenience later.  YOU SHOULD EXPECT: Some feelings of bloating in the abdomen. Passage of more gas than usual.  Walking can help get rid of the air that was put into your GI tract during the procedure and reduce the bloating. If you had a lower endoscopy (such as a colonoscopy or flexible sigmoidoscopy) you may notice spotting of blood in your stool or on the toilet paper. If you underwent a bowel prep for your procedure, you may not have a normal bowel movement for a few days.  Please Note:  You might notice some irritation and congestion in your nose or some drainage.  This is from the oxygen used during your procedure.  There is no need for concern and it should clear up in a day or so.  SYMPTOMS TO REPORT IMMEDIATELY:  Following lower endoscopy (colonoscopy or flexible sigmoidoscopy):  Excessive amounts of blood in the stool  Significant tenderness or worsening of abdominal pains  Swelling of the abdomen that is new, acute  Fever of 100F or higher   For urgent or emergent issues, a gastroenterologist can be reached at any hour by calling [phone]. Do not use MyChart messaging for urgent concerns.    DIET:  We do recommend a small meal at first, but then you may proceed to your regular diet.  Drink plenty of fluids but you should avoid alcoholic beverages for 24 hours.  ACTIVITY:  You should plan to take it  easy for the rest of today and you should NOT DRIVE or use heavy machinery until tomorrow (because of the sedation medicines used during the test).    FOLLOW UP: Our staff will call the number listed on your records the next business day following your procedure.  We will call around 7:15- 8:00 am to check on you and address any questions or concerns that you may have regarding the information given to you following your procedure. If we do not reach you, we will leave a message.     If any biopsies were taken you will be contacted by phone or by letter within the next 1-3 weeks.  Please call us at [phone] if you have not heard about the biopsies in 3 weeks.    SIGNATURES/CONFIDENTIALITY: You and/or your care partner have signed paperwork which will be entered into your electronic medical record.  These signatures attest to the fact that that the information above on your After Visit Summary has been reviewed and is understood.  Full responsibility of the confidentiality of this discharge information lies with you and/or your care-partner.

## 2023-03-25 NOTE — Progress Notes (Signed)
Report given to PACU, vss 

## 2023-03-25 NOTE — Op Note (Signed)
[facility] Patient Name: Edward Glass Procedure Date: 03/25/2023 3:11 PM MRN: [id number] Endoscopist: Viviann Spare P. Adela Lank , MD, [id number] Age: 67 Referring MD:  Date of Birth: 13-Jul-1956 Gender: Male Account #: [id number] Procedure:                Colonoscopy Indications:              High risk colon cancer surveillance: Personal                            history of colonic polyps - 2 adenomas removed                            06/2015 Medicines:                Monitored Anesthesia Care Procedure:                Pre-Anesthesia Assessment:                           - Prior to the procedure, a History and Physical                            was performed, and patient medications and                            allergies were reviewed. The patient's tolerance of                            previous anesthesia was also reviewed. The risks                            and benefits of the procedure and the sedation                            options and risks were discussed with the patient.                            All questions were answered, and informed consent                            was obtained. Prior Anticoagulants: The patient has                            taken Plavix (clopidogrel), last dose was 5 days                            prior to procedure. ASA Grade Assessment: III - A                            patient with severe systemic disease. After                            reviewing the risks and benefits, the patient was  deemed in satisfactory condition to undergo the                            procedure.                           After obtaining informed consent, the colonoscope                            was passed under direct vision. Throughout the                            procedure, the patient's blood pressure, pulse, and                            oxygen saturations were monitored continuously. The                             Olympus Scope SN: [id number] was introduced through                            the anus and advanced to the the cecum, identified                            by appendiceal orifice and ileocecal valve. Cecal                            intubation was challenging due to a redundant colon                            with looping. The patient tolerated the procedure                            well. The quality of the bowel preparation was                            good. The ileocecal valve, appendiceal orifice, and                            rectum were photographed. Scope In: 3:24:13 PM Scope Out: 3:42:11 PM Scope Withdrawal Time: 0 hours 9 minutes 48 seconds  Total Procedure Duration: 0 hours 17 minutes 58 seconds  Findings:                 The perianal and digital rectal examinations were                            normal.                           A 3 mm polyp was found in the transverse colon. The                            polyp was sessile. The polyp was removed with a  cold snare. Resection and retrieval were complete.                           The colon was long / redundant / tortuous with                            looping.                           Internal hemorrhoids were found during retroflexion.                           The exam was otherwise without abnormality. Complications:            No immediate complications. Estimated blood loss:                            Minimal. Estimated Blood Loss:     Estimated blood loss was minimal. Impression:               - One 3 mm polyp in the transverse colon, removed                            with a cold snare. Resected and retrieved.                           - Tortuous / redundant colon.                           - Internal hemorrhoids.                           - The examination was otherwise normal. Recommendation:           - Patient has a contact number available for                             emergencies. The signs and symptoms of potential                            delayed complications were discussed with the                            patient. Return to normal activities tomorrow.                            Written discharge instructions were provided to the                            patient.                           - Resume previous diet.                           - Continue present medications.                           -  Resume Plavix tomorrow.                           - Await pathology results. Viviann Spare P. Adela Lank, MD 03/25/2023 3:47:14 PM This report has been signed electronically.

## 2023-03-25 NOTE — Progress Notes (Signed)
Called to room to assist during endoscopic procedure.  Patient ID and intended procedure confirmed with present staff. Received instructions for my participation in the procedure from the performing physician.  

## 2023-03-25 NOTE — Progress Notes (Signed)
[facility] History and Physical   Primary Care Physician:  [name], MD   Reason for Procedure:   History of colon polyps  Plan:    colonoscopy     HPI: Edward Glass is a 67 y.o. male  here for colonoscopy surveillance - 2 small adenomas removed 06/2015.   Patient denies any bowel symptoms at this time. No family history of colon cancer known. Otherwise feels well without any cardiopulmonary symptoms. Plavix held for 5 days for this procedure.  I have discussed risks / benefits of anesthesia and endoscopic procedure with Selena Batten and they wish to proceed with the exams as outlined today.    Past Medical History:  Diagnosis Date   Arthritis    right shoulder   Atypical chest pain 08/10/2013   BPH (benign prostatic hyperplasia)    COVID-19 07/2020   CVA (cerebral vascular accident) (HCC) 08/2018   spastic hemiparesis of right dominant side   Dysphagia    Elevated aspartate aminotransferase level 06/16/2015   Excessive salivation 06/16/2015   H/O nutritional disorder 06/16/2015   H/O vitamin D deficiency    History of loop recorder 2020   Hx of adenomatous colonic polyps 2016   Hyperlipidemia    Hypertension    Hypogonadism male 08/21/2014   Leg varices 02/07/2012   Screening for prostate cancer 08/21/2014   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    Past Surgical History:  Procedure Laterality Date   COLONOSCOPY     HERNIA REPAIR  2006   left tricep surgery     LOOP RECORDER INSERTION N/A 09/04/2018   Procedure: LOOP RECORDER INSERTION;  Surgeon: Marinus Maw, MD;  Location: [facility];  Service: Cardiovascular;  Laterality: N/A;   TEE WITHOUT CARDIOVERSION N/A 09/04/2018   Procedure: TRANSESOPHAGEAL ECHOCARDIOGRAM (TEE);  Surgeon: Jake Bathe, MD;  Location: [facility] ENDOSCOPY;  Service: Cardiovascular;  Laterality: N/A;   TOTAL SHOULDER ARTHROPLASTY Right 12/16/2020   Procedure: RIGHT TOTAL SHOULDER ARTHROPLASTY;  Surgeon: Cammy Copa, MD;  Location: [facility] OR;  Service: Orthopedics;  Laterality: Right;    Prior to Admission medications   Medication Sig Start Date End Date Taking? Authorizing Provider  acetaminophen (TYLENOL) 325 MG tablet Take 2 tablets (650 mg total) by mouth every 4 (four) hours as needed for mild pain (or temp > 37.5 C (99.5 F)). 09/29/18  Yes Angiulli, Mcarthur Rossetti, PA-C  amLODipine (NORVASC) 5 MG tablet Take 5 mg by mouth daily. 03/25/20  Yes [provider]  clopidogrel (PLAVIX) 75 MG tablet TAKE 1 TABLET BY MOUTH EVERY DAY 01/17/23  Yes McCue, Shanda Bumps, NP  FLUoxetine (PROZAC) 20 MG capsule Take by mouth. 12/30/21  Yes [provider]  Multiple Vitamin (MULTIVITAMIN PO) Take by mouth daily.   Yes [provider]  rosuvastatin (CRESTOR) 10 MG tablet Take 1 tablet (10 mg total) by mouth daily. Patient taking differently: Take 10 mg by mouth daily. Pt states he takes 5mg  per MD order 09/29/22  Yes McCue, Shanda Bumps, NP  tiZANidine (ZANAFLEX) 2 MG tablet TAKE 1 TABLET BY MOUTH AT BEDTIME FOR 7 DAYS, THEN 1 TABLET 2 TIMES DAILY FOR 21 DAYS. 01/04/23  Yes Micki Riley, MD  albuterol (VENTOLIN HFA) 108 (90 Base) MCG/ACT inhaler as needed for shortness of breath. 06/08/21   [provider]  baclofen (LIORESAL) 10 MG tablet Take 1 tablet (10 mg total) by mouth 3 (three) times daily. 09/28/22   Ihor Austin, NP  diclofenac Sodium (VOLTAREN) 1 %  GEL Apply topically in the morning and at bedtime. 03/27/19   [provider]    Current Outpatient Medications  Medication Sig Dispense Refill   acetaminophen (TYLENOL) 325 MG tablet Take 2 tablets (650 mg total) by mouth every 4 (four) hours as needed for mild pain (or temp > 37.5 C (99.5 F)).     amLODipine (NORVASC) 5 MG tablet Take 5 mg by mouth daily.     clopidogrel (PLAVIX) 75 MG tablet TAKE 1 TABLET BY MOUTH EVERY DAY 90 tablet 1   FLUoxetine (PROZAC) 20 MG capsule Take by mouth.     Multiple Vitamin (MULTIVITAMIN PO)  Take by mouth daily.     rosuvastatin (CRESTOR) 10 MG tablet Take 1 tablet (10 mg total) by mouth daily. (Patient taking differently: Take 10 mg by mouth daily. Pt states he takes 5mg  per MD order) 90 tablet 3   tiZANidine (ZANAFLEX) 2 MG tablet TAKE 1 TABLET BY MOUTH AT BEDTIME FOR 7 DAYS, THEN 1 TABLET 2 TIMES DAILY FOR 21 DAYS. 150 tablet 1   albuterol (VENTOLIN HFA) 108 (90 Base) MCG/ACT inhaler as needed for shortness of breath.     baclofen (LIORESAL) 10 MG tablet Take 1 tablet (10 mg total) by mouth 3 (three) times daily. 30 each 0   diclofenac Sodium (VOLTAREN) 1 % GEL Apply topically in the morning and at bedtime.     Current Facility-Administered Medications  Medication Dose Route Frequency Provider Last Rate Last Admin   0.9 %  sodium chloride infusion  500 mL Intravenous Once Zebulun Deman, Willaim Rayas, MD       IncobotulinumtoxinA SOLR 100 Units  100 Units Intramuscular Once Kirsteins, Victorino Sparrow, MD        Allergies as of 03/25/2023   (No Known Allergies)    Family History  Problem Relation Age of Onset   Kidney failure Mother    Cancer Mother    Prostate cancer Father    Cancer Father    Diabetes Father    Colon cancer Neg Hx    Esophageal cancer Neg Hx    Rectal cancer Neg Hx    Stomach cancer Neg Hx     Social History   Socioeconomic History   Marital status: Divorced    Spouse name: Not on file   Number of children: 2   Years of education: Not on file   Highest education level: Not on file  Occupational History   Occupation: Systems analyst  Tobacco Use   Smoking status: Never   Smokeless tobacco: Never  Vaping Use   Vaping status: Never Used  Substance and Sexual Activity   Alcohol use: No   Drug use: No   Sexual activity: Not on file  Other Topics Concern   Not on file  Social History Narrative   Not on file   Social Determinants of Health   Financial Resource Strain: Low Risk  (11/05/2022)   Received from [facility], [facility]   Overall  Financial Resource Strain (CARDIA)    Difficulty of Paying Living Expenses: Not hard at all  Food Insecurity: No Food Insecurity (11/05/2022)   Received from [facility], [facility]   Hunger Vital Sign    Worried About Running Out of Food in the Last Year: Never true    Ran Out of Food in the Last Year: Never true  Transportation Needs: No Transportation Needs (11/05/2022)   Received from [facility], [facility]   [facility] - Transportation  Lack of Transportation (Medical): No    Lack of Transportation (Non-Medical): No  Physical Activity: Sufficiently Active (11/05/2022)   Received from [facility], [facility]   Exercise Vital Sign    Days of Exercise per Week: 5 days    Minutes of Exercise per Session: 60 min  Stress: No Stress Concern Present (11/05/2022)   Received from [facility], [facility] - Occupational Stress Questionnaire    Feeling of Stress : Not at all  Social Connections: Socially Integrated (11/05/2022)   Received from [facility], [facility]   Social Network    How would you rate your social network (family, work, friends)?: Good participation with social networks  Intimate Partner Violence: Unknown (11/05/2022)   Received from [facility], [facility]   HITS    Physically Hurt: Not on file    Over the last 12 months how often did your partner insult you or talk down to you?: 1    Over the last 12 months how often did your partner threaten you with physical harm?: 1    Over the last 12 months how often did your partner scream or curse at you?: 1    Review of Systems: All other review of systems negative except as mentioned in the HPI.  Physical Exam: Vital signs BP (!) 152/88   Pulse (!) 54   Temp 98 F (36.7 C) (Temporal)   Ht 5\' 5"  (1.651 m)   Wt 172 lb (78 kg)   SpO2 100%   BMI 28.62 kg/m   General:   Alert,  Well-developed, pleasant and cooperative in NAD Lungs:  Clear throughout  to auscultation.   Heart:  Regular rate and rhythm Abdomen:  Soft, nontender and nondistended.   Neuro/Psych:  Alert and cooperative. Normal mood and affect. A and O x 3  Harlin Rain, MD [facility]

## 2023-03-29 ENCOUNTER — Telehealth: Payer: Self-pay

## 2023-03-29 NOTE — Telephone Encounter (Signed)
  Follow up Call-     03/25/2023    2:33 PM  Call back number  Post procedure Call Back phone  # [phone]  Permission to leave phone message Yes     Patient questions:  Do you have a fever, pain , or abdominal swelling? No. Pain Score  0 *  Have you tolerated food without any problems? Yes.    Have you been able to return to your normal activities? Yes.    Do you have any questions about your discharge instructions: Diet   No. Medications  No. Follow up visit  No.  Do you have questions or concerns about your Care? No.  Actions: * If pain score is 4 or above: No action needed, pain <4.

## 2023-03-31 ENCOUNTER — Other Ambulatory Visit: Payer: Self-pay | Admitting: Neurology

## 2023-04-05 ENCOUNTER — Encounter: Payer: Self-pay | Admitting: Gastroenterology

## 2023-04-05 NOTE — Progress Notes (Unsigned)
[facility] [address] [place]. [place] [address] [phone]       STROKE FOLLOW UP NOTE  [name] Date of Birth:  [birth year] Medical Record Number:  [id number]   Reason for Referral: Cryptogenic stroke follow up [facility] provider: Dr. Pearlean Brownie PCP: Edward Mis, MD     CHIEF COMPLAINT:  No chief complaint on file.    HPI:  Update 04/06/2023 JM: Patient returns for follow-up visit.  At prior visit, started on Topamax for likely tension headaches as well as increased baclofen dosage for increased right sided spasticity.  During the interval time, baclofen switched to tizanidine as no benefit with higher baclofen dosage and discontinued topiramate due to side effects.   Denies new stroke/TIA symptoms.  Remains on Crestor and Plavix.  Routinely follows with PCP for stroke risk factor management.       History provided for reference purposes only Update 09/28/2022 JM: Patient returns for 9-month stroke follow-up.  He complains of increased right sided stiffness over the past month, also mentions pressure type headaches, frontal area, over the past 6 months.  Denies any changes in vision, denies photophobia or phonophobia.  Use of baclofen 5mg  TID but denies much benefit at current dosage. Received Botox by Dr. Wynn Banker back in October but did not f/u as no benefit.  He also complains of tinnitus, was seen by audiology and was told he has some hearing loss contributing to symptoms.  Denies new stroke/TIA symptoms.   Compliant on Crestor and Plavix.  He is requesting Crestor dosage to be lowered as he feels this is contributing to increased stiffness as this worsens in the evening time after taking his Crestor.  Currently on 20 mg daily.  Blood pressure well-controlled.  Loop recorder reached end of service 05/2022 without evidence of A-fib during monitoring, has this removed by Dr. Ladona Ridgel back in December.  Completed sleep study which did not show any  significant sleep apnea requiring treatment.  Update 03/30/2022 JM: Patient returns for 68-month hospital follow-up.  He was evaluated in ED on 8/31 after presenting with left-sided headache lasting approximately 1 minute followed by intermittent left cheek numbness and LLE paresthesia lasting for approx 1 hour. Completed MRI brain which was negative for any findings. Per ED note, right sided deficits at baseline and no evidence of left-sided deficits.  No reoccurrence of left sided symptoms or headaches. No new stroke symptoms. Continued right-sided spasticity which he feels continues to worsen. Has been having more right sided hip pain, is considering doing PRP injection as this previously was beneficial.  Continued use of cane, denies any recent falls.  Continues to workout routinely.  Compliant on Crestor and Plavix.  Blood pressure 120/75.  Loop recorder has not shown atrial fibrillation thus far.  Evaluated by Dr. Frances Glass 7/27 with plans on pursuing sleep study currently scheduled on 9/25  No further concerns at this time  Update 01/04/2022 JM: Patient returns for acute visit after calling on 6/7 reporting worsening right arm and leg weakness lasting a few minutes prior to returning to baseline which occurred approx 3 weeks ago and requesting an MRI brain to rule out new stroke.  He did not seek any emergent evaluation at time or onset.  Denies any other associated symptoms such as headache, visual changes, dizziness or cognitive changes.  Reports since that time, he has had fluctuation of right leg heaviness sensation typically towards the end of the day or with increased activity.  He also notes  fluctuation of speech over the past month.  Discussed concern of worsening spasticity at prior visit, referred to PT/OT for which he was evaluated for but did not have any additional sessions as advised.  He believes his spasticity has continued to worsen. He was seen by Dr. Wynn Banker 6/8 who referred him to OT for  eval of Vivistim for continued RUE deficits.  Was seen by PCP 6/7 and restarted on Prozac for worsening depression.  Reports compliance on Plavix and Crestor, denies side effects.  Blood pressure today 135/79.  Loop recorder has not shown atrial fibrillation thus far. He does note day time fatigue, not feeling refreshed upon awakening and having nocturia approx 5-6x per night.  Denies snoring, witnessed apneas or morning headaches.  No further concerns at this time.  Update 07/23/2021 JM: patient returns for 68 months stroke follow up. C/o right sided spasticity with slight increase in spasticity at times over the past couple of months, continues on baclofen 5 mg 3 times daily. Use of quad cane - no recent falls. Interested in doing additional therapy if able.  Dysarthria stable. No new stroke symptoms.  Compliant on Plavix and Crestor.  Blood pressure today 142/84.  Loop recorder has not shown atrial fibrillation thus far.  No further concerns.  Update 01/15/2021 JM: Mr. [name] returns for 68-month stroke follow-up unaccompanied.  He has been stable from stroke standpoint without new stroke/TIA symptoms and residual right sided spasticity and dysarthria.  Continues to use quad cane and denies any recent falls.  Use of baclofen 5 mg 3 times daily with benefit.  Compliant on Plavix without associated side effects.  He is concerned regarding possible statin myalgias as he will experience [name] pain with cramping shortly after taking atorvastatin.  Blood pressure today 137/81.  Loop recorder has not shown atrial fibrillation thus far.  Underwent right total shoulder arthroplasty 12/16/2020 by Dr. August Saucer without complication and recently started therapy.  No new concerns at this time.  Update 07/15/2020 JM: Mr. [name] returns for 5-month stroke follow-up unaccompanied. 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 Has been using baclofen for spasticity 5mg  three times daily but does experience increased  stiffness upon awakening and towards the end of the day. Feels as though speech has been improving. Denies new or worsening stroke/TIA symptoms. At prior visit, complained of right shoulder pain worsened post stroke. Evaluated by orthopedics with note personally reviewed and x-ray revealing severe end-stage OA with progression compared to prior imaging in 08/2018.  He has since received 2 injections with benefit.  Remains on Plavix and atorvastatin without side effects for secondary stroke prevention. Blood pressure today 127/77. Loop recorder has not shown atrial fibrillation thus far.  Previously on Prozac for depression/anxiety post stroke but he has since been able to discontinue this without difficulty.  No further concerns at this time.  Update 01/10/2020 JM: Mr. Alessio returns for follow-up regarding stroke in 08/2018.  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  Denies new stroke/TIA symptoms.  Currently ambulating with a quad cane and denies any recent falls.  Reports typically caring cane at his side "just in case" but he is here to walk without it.  Initiated baclofen 5 mg twice daily as needed for spasticity.  Difficulty tolerating daytime dose due to increased fatigue has continued 5 mg dosage at night with only mild benefit.  He questions benefit of restarting Botox as he only received 2 rounds.  He does report  right shoulder pain which is chronic but worsened post stroke.  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  Continues on clopidogrel and atorvastatin for secondary stroke prevention.  Prior lipid panel on 10/09/2019 by PCP showed LDL 51.  Blood pressure today 122/77.  Loop recorder is not shown atrial fibrillation thus far.  No further  concerns at this time.  Update 09/06/2019 JM: Mr. Tollis is a 67 year old male who is being seen today for cryptogenic stroke follow-up.  He was previously seen via virtual visit on 11/08/2018 and has not previously followed up as recommended.  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  He continues to ambulate with a Rollator walker and denies any recent falls.  Greatest concern today is including continued right-sided spasticity.  Trial of Botox injections but denies benefit as well as trial of Zanaflex but patient self discontinued due to reported side effect of upset stomach.  Completed 3 months DAPT and continues on Plavix alone without bleeding or bruising.  Continues on atorvastatin without myalgias.  Blood pressure today 116/78.  Continues to follow with PCP regularly for HTN and HLD management/monitoring.  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  Loop recorder has not shown atrial fibrillation thus far.  Denies new or worsening stroke/TIA symptoms.  Initial visit via virtual 11/08/2018: He has been stable from a stroke standpoint with residual deficits of aphasia, cognitive deficits and right hemiparesis but does endorse improvement.  He continues to participate at neuro rehab PT/OT/ST along with continuing exercises at home.  He is currently ambulating with a quad cane and denies any recent falls.  He does need some assistance with bathing and dressing but continues to be able to do more on his own each day.  He no longer has any difficulties with swallowing and is currently on a regular diet.  He does endorse increased salivation.  He continues on aspirin and Plavix without side effects of bleeding or bruising.  Continues on atorvastatin without side effects myalgias.  Blood pressures not routinely monitored at home and encourage daughter and patient to obtain cough to start monitoring.  He does endorse occasional  depression difficulties such as feeling down or increased anxiety.  It was recommended to initiate Prozac during hospital admission but states once he went home, he did not continue as he felt it was not needed.  Per review of loop recorder, no report of atrial fibrillation found but per patient and daughter, they were contacted by cardiology stating arrhythmia had been found and is in the process of scheduling visit with cardiology.  No further concerns at this time.  Denies new or worsening stroke/TIA symptoms.  Stroke admission 08/30/2018: Mr. Krider is a 67 year old male who presented to [facility] ED with right-sided weakness and aphasia.   CT head reviewed which showed age indeterminate small vessel ischemia in the left BG.  CTA showed possible left M1 occlusion versus severe stenosis.  CT perfusion no infarct for but large penumbra.  MRI  brain reviewed and showed left MCA patchy infarcts mostly concentrated in the left BG. He arrived greater than 24h from time since last well, therefore no acute stroke interventions done.  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  He unfortunately had neurological worsening with severe aphasia and dense right hemiplegia with extension of deficits on 09/01/2018.  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   When pts dtrs arrive, they both explain that in 2016 after tricep repair surgery they noted a possible arrhythmia and placed him on holter monitor for 30d, but was unrevealing.  2D echo showed an EF of 55 to 60%.  TCD bubble study negative for PFO.  Lower extremity venous Dopplers negative for DVT.  TEE showed small PFO/bubble crossover noted during Valsalva which was not felt to be clinically significant along with no evidence of cardiac thrombus therefore loop recorder placed.   Recommended DAPT for 3 months then Plavix alone due to severe left M1  stenosis.  HTN stable recommended long-term BP goal 1 30-1 50 due left M1 stenosis.  HLD 104 and initiated atorvastatin 40 mg daily.  Other stroke risk factors include family history of stroke but no personal history of stroke.  Other active problems including reactive depression as he previously worked as a Aeronautical engineer and overall healthy and was devastated with diagnosis and residual deficits.  Initiated Prozac on 08/31/2018.  He had residual deficits of right hemiparesis, dysarthria and mixed aphasia and was discharged to [facility] for ongoing therapy.      ROS:   14 system review of systems performed and negative with exception of those listed in HPI  PMH:  Past Medical History:  Diagnosis Date   Arthritis    right shoulder   Atypical chest pain 08/10/2013   BPH (benign prostatic hyperplasia)    COVID-19 07/2020   CVA (cerebral vascular accident) (HCC) 08/2018   spastic hemiparesis of right dominant side   Dysphagia    Elevated aspartate aminotransferase level 06/16/2015   Excessive salivation 06/16/2015   H/O nutritional disorder 06/16/2015   H/O vitamin D deficiency    History of loop recorder 2020   Hx of adenomatous colonic polyps 2016   Hyperlipidemia    Hypertension    Hypogonadism male 08/21/2014   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   Screening for prostate cancer 08/21/2014   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    PSH:  Past Surgical History:  Procedure Laterality Date   COLONOSCOPY     HERNIA REPAIR  2006   left tricep surgery     LOOP RECORDER INSERTION N/A 09/04/2018   Procedure: LOOP RECORDER INSERTION;  Surgeon: Marinus Maw, MD;  Location: [facility] INVASIVE CV LAB;  Service: Cardiovascular;  Laterality: N/A;   TEE WITHOUT CARDIOVERSION N/A 09/04/2018   Procedure: TRANSESOPHAGEAL ECHOCARDIOGRAM (TEE);  Surgeon: Jake Bathe, MD;  Location: [facility] ENDOSCOPY;  Service: Cardiovascular;  Laterality: N/A;   TOTAL SHOULDER ARTHROPLASTY Right 12/16/2020   Procedure: RIGHT TOTAL  SHOULDER ARTHROPLASTY;  Surgeon: Cammy Copa, MD;  Location: [facility] OR;  Service: Orthopedics;  Laterality: Right;    Social History:  Social History   Socioeconomic History   Marital status: Divorced    Spouse name: Not on file   Number of children: 2   Years of education: Not on file   Highest education level: Not on file  Occupational History   Occupation: Systems analyst  Tobacco Use   Smoking status: Never   Smokeless tobacco: Never  Vaping Use   Vaping status: Never Used  Substance and Sexual Activity   Alcohol use: No   Drug use: No   Sexual activity: Not on file  Other Topics Concern   Not on file  Social History Narrative   Not on file   Social Determinants of Health   Financial Resource Strain: Low Risk  (11/05/2022)   Received from [facility], [facility]   Overall Financial Resource Strain (CARDIA)    Difficulty of Paying Living Expenses: Not hard at all  Food Insecurity: No Food Insecurity (11/05/2022)   Received from [facility], [facility]   Hunger Vital Sign    Worried About Running Out of Food in the Last Year: Never true    Ran Out of Food in the Last Year: Never true  Transportation Needs: No Transportation Needs (11/05/2022)   Received from [facility], [facility]   PRAPARE - Transportation    Lack of Transportation (Medical): No    Lack of Transportation (Non-Medical): No  Physical Activity: Sufficiently Active (11/05/2022)   Received from [facility], [facility]   Exercise Vital Sign    Days of Exercise per Week: 5 days    Minutes of Exercise per Session: 60 min  Stress: No Stress Concern Present (11/05/2022)   Received from [facility], [facility] - Occupational Stress Questionnaire    Feeling of Stress : Not at all  Social Connections: Socially Integrated (11/05/2022)   Received from [facility], [facility]   Social Network    How would you rate your social network  (family, work, friends)?: Good participation with social networks  Intimate Partner Violence: Unknown (11/05/2022)   Received from [facility], [facility]   HITS    Physically Hurt: Not on file    Over the last 12 months how often did your partner insult you or talk down to you?: 1    Over the last 12 months how often did your partner threaten you with physical harm?: 1    Over the last 12 months how often did your partner scream or curse at you?: 1    Family History:  Family History  Problem Relation Age of Onset   Kidney failure Mother    Cancer Mother    Prostate cancer Father    Cancer Father    Diabetes Father    Colon cancer Neg Hx    Esophageal cancer Neg Hx    Rectal cancer Neg Hx    Stomach cancer Neg Hx     Medications:   Current Outpatient Medications on File Prior to Visit  Medication Sig Dispense Refill   acetaminophen (TYLENOL) 325 MG tablet Take 2 tablets (650 mg total) by mouth every 4 (four) hours as needed for mild pain (or temp > 37.5 C (99.5 F)).     albuterol (VENTOLIN HFA) 108 (90 Base) MCG/ACT inhaler as needed for shortness of breath.     amLODipine (NORVASC) 5 MG tablet Take 5 mg by mouth daily.     baclofen (LIORESAL) 10 MG tablet Take 1 tablet (10 mg total) by mouth 3 (three) times daily. 30 each 0   clopidogrel (PLAVIX) 75 MG tablet TAKE 1 TABLET BY MOUTH EVERY DAY 90 tablet 1   diclofenac Sodium (VOLTAREN) 1 % GEL Apply topically in the morning and at bedtime.     FLUoxetine (PROZAC) 20 MG capsule Take by mouth.     Multiple Vitamin (MULTIVITAMIN PO) Take by mouth daily.     rosuvastatin (CRESTOR) 10 MG tablet Take 1 tablet (10 mg total) by mouth daily. (Patient taking differently: Take 10 mg by mouth daily. Pt  states he takes 5mg  per MD order) 90 tablet 3   tiZANidine (ZANAFLEX) 2 MG tablet Take 1 tablet (2 mg total) by mouth at bedtime. 180 tablet 1   Current Facility-Administered Medications on File Prior to Visit  Medication Dose Route  Frequency Provider Last Rate Last Admin   IncobotulinumtoxinA SOLR 100 Units  100 Units Intramuscular Once Kirsteins, Victorino Sparrow, MD        Allergies:  No Known Allergies   Physical Exam  There were no vitals filed for this visit.  There is no height or weight on file to calculate BMI. No results found.   General: well developed, well nourished, pleasant middle-aged male, seated, in no evident distress Head: head normocephalic and atraumatic.   Neck: supple with no carotid or supraclavicular bruits Cardiovascular: regular rate and rhythm, no murmurs Musculoskeletal: limited right shoulder ROM Skin:  no rash/petichiae Vascular:  Normal pulses all extremities   Neurologic Exam Mental Status: Awake and fully alert. Mild dysarthria with hypophonia. Oriented to place and time. Recent and remote memory intact. Attention span, concentration and fund of knowledge appropriate. Mood and affect appropriate.  Cranial Nerves: Pupils equal, briskly reactive to light. Extraocular movements full without nystagmus. Visual fields full to confrontation. Hearing intact. Facial sensation intact. Mild right lower facial weakness when smiling.  tongue, palate moves normally and symmetrically.  Motor: Full strength left upper and lower extremity, very slight right arm and leg weakness distally compared to left side with increased tone arm>leg Sensory.: intact to touch , pinprick , position and vibratory sensation.  Coordination: Rapid alternating movements normal on left side. Finger-to-nose and heel-to-shin performed accurately on left side.  Unable to adequately assess right side due to increased tone Gait and Station: Arises from chair without difficulty. Stance is normal. Gait demonstrates  slow hemiplegic gait and stiffened right leg with use of cane.  Tandem walking heel toe not attempted Reflexes: 2+ RUE and [name] and 1+ left side. Toes downgoing.        ASSESSMENT/PLAN: Edward Glass is a 67 y.o.  year old male here with left MCA patchy infarct secondary to left M1 occlusion with embolic pattern due to unclear etiology on 08/30/2018 and neurological worsening with extension of deficits on 09/01/2018.  ILR reached end of service 06/2022 s/p removal. No evidence of a fib during monitoring duration. Vascular risk factors include intracranial stenosis, HLD and HTN.  Sleep study negative for sleep apnea.  Prior concerns of worsening right sided deficits, MR brain negative for new/acute abnormalities.  Possible TIA vs complicated migraine 02/2022 after presenting to ED with headache preceded by transient left facial and leg numbness with work up remarkable.     Cryptogenic left MCA infarct:  Residual RUE and [name] spasticity, gait impairment and dysarthria.  Continues to experience gradual worsening of spasticity.  Denies benefit after 1 injection with botox, discussed at times this can take a couple sessions to see benefit but declines interest in additional injections.  Increase baclofen to 10mg  TID.  Continue clopidogrel 75 mg daily  and Crestor 10mg  daily for secondary stroke prevention.  S/p ILR removal 06/2022 - no evidence of A-fib during almost 4 yr monitoring  Discussed secondary stroke prevention measures and importance of close PCP follow-up for aggressive stroke risk factor management including BP goal<130/90, HLD with LDL goal<70 and DM with A1c.<7  Stroke labs 09/2022: LDL 55, A1c 5.7  Tension-type headache: Prior workup with headache unremarkable.  No red flag symptoms.  Neuro exam  intact.  Recommend initiating topiramate 50 mg nightly.  Advised to call after 1 to 2 weeks if no benefit for dosage increase or sooner if difficulty tolerating.     Follow up in 6 months or call earlier if needed   CC: Edward Mis, MD  I spent 33 minutes of face-to-face and non-face-to-face time with patient.  This included previsit chart review, lab review, study review, order entry, electronic health  record documentation, patient education and discussion regarding above diagnoses and treatment plan and answered all the questions to patient satisfaction  Ihor Austin, [facility]  [facility] [address] [place], [place] [address]  Phone [phone] Fax [phone] Note: This document was prepared with digital dictation and possible smart phrase technology. Any transcriptional errors that result from this process are unintentional.

## 2023-04-06 ENCOUNTER — Ambulatory Visit: Payer: Medicare Other | Admitting: Adult Health

## 2023-04-06 ENCOUNTER — Encounter: Payer: Self-pay | Admitting: Adult Health

## 2023-04-06 VITALS — BP 119/69 | HR 66 | Ht 64.0 in | Wt 172.0 lb

## 2023-04-06 DIAGNOSIS — G8111 Spastic hemiplegia affecting right dominant side: Secondary | ICD-10-CM

## 2023-04-06 DIAGNOSIS — Z8673 Personal history of transient ischemic attack (TIA), and cerebral infarction without residual deficits: Secondary | ICD-10-CM | POA: Diagnosis not present

## 2023-04-06 MED ORDER — PRAVASTATIN SODIUM 20 MG PO TABS: 20.0000 mg | ORAL_TABLET | Freq: Every day | ORAL | 5 refills | Status: DC

## 2023-04-06 NOTE — Patient Instructions (Addendum)
We will make adjustments to cholesterol medication to see if this helps with stiffness sensation. If sensation persists, can consider further adjusting tizanidine dosage but also highly recommend scheduling follow up visit with Dr. Wynn Banker for further recommendations   Continue clopidogrel 75 mg daily  and  will switch cholesterol medication from Crestor to pravastatin  for secondary stroke prevention - can wait for 1 week after stopping Crestor to see if symptoms improve and then start pravastatin   Continue to follow up with PCP regarding blood pressure and cholesterol management  Maintain strict control of hypertension with blood pressure goal below 130/90 and cholesterol with LDL cholesterol (bad cholesterol) goal below 70 mg/dL.   Signs of a Stroke? Follow the BEFAST method:  Balance Watch for a sudden loss of balance, trouble with coordination or vertigo Eyes Is there a sudden loss of vision in one or both eyes? Or double vision?  Face: Ask the person to smile. Does one side of the face droop or is it numb?  Arms: Ask the person to raise both arms. Does one arm drift downward? Is there weakness or numbness of a leg? Speech: Ask the person to repeat a simple phrase. Does the speech sound slurred/strange? Is the person confused ? Time: If you observe any of these signs, call 911.     Followup in the future with me in 6 months or call earlier if needed      Thank you for coming to see [place] at [facility]. I hope we have been able to provide you high quality care today.  You may receive a patient satisfaction survey over the next few weeks. We would appreciate your feedback and comments so that we may continue to improve ourselves and the health of our patients.

## 2023-04-19 ENCOUNTER — Telehealth: Payer: Self-pay | Admitting: Adult Health

## 2023-04-19 MED ORDER — EZETIMIBE 10 MG PO TABS: 10.0000 mg | ORAL_TABLET | Freq: Every day | ORAL | 1 refills | Status: AC

## 2023-04-19 NOTE — Telephone Encounter (Signed)
Please see if symptoms improved once he stopped Crestor. If not, symptoms likely not from statin use. If they did improve and then returned/worsened on pravastatin, can consider switching to Zetia.

## 2023-04-19 NOTE — Telephone Encounter (Signed)
Pt has called to report that he is worse off on the pravastatin (PRAVACHOL) 20 MG tablet .  Pt reports that this is causing him joint and muscle pain, please call pt to discuss.

## 2023-04-19 NOTE — Telephone Encounter (Signed)
Okay to start Zetia 10mg  daily. If symptoms return on Zetia, will need to consider use of PCSK9 inhibitors (injectable medications) such as Repatha or Praulent. Thank you.

## 2023-05-18 ENCOUNTER — Other Ambulatory Visit: Payer: Self-pay | Admitting: Anesthesiology

## 2023-05-18 ENCOUNTER — Telehealth: Payer: Self-pay | Admitting: Adult Health

## 2023-05-18 MED ORDER — TIZANIDINE HCL 2 MG PO TABS: 2.0000 mg | ORAL_TABLET | Freq: Two times a day (BID) | ORAL | 1 refills | Status: DC

## 2023-05-18 NOTE — Telephone Encounter (Signed)
Pt called requesting a refill on his  tiZANidine (ZANAFLEX) 2 MG tablet but he stated that the Rx needs to be changed to two pills a day because that is what he was told in last visit. Please send to [facility] on [facility].  Please advise.

## 2023-06-16 ENCOUNTER — Other Ambulatory Visit (INDEPENDENT_AMBULATORY_CARE_PROVIDER_SITE_OTHER): Payer: Self-pay

## 2023-06-16 ENCOUNTER — Ambulatory Visit: Payer: Medicare Other | Admitting: Orthopedic Surgery

## 2023-06-16 DIAGNOSIS — M25551 Pain in right hip: Secondary | ICD-10-CM

## 2023-06-16 DIAGNOSIS — M7061 Trochanteric bursitis, right hip: Secondary | ICD-10-CM | POA: Diagnosis not present

## 2023-06-16 NOTE — Progress Notes (Signed)
Orthopedic Surgery Office Note   Assessment: Patient is a 67 y.o. male with right hip pain consistent with trochanteric bursitis   Plan: -No operative plans at this time -Weightbearing as tolerated bilateral lower extremities -Performed a right trochanteric bursa injection today in office Provided him with a referral to PT -Return to office on an as-needed basis  Right hip trochanteric bursa injection note: After discussing the risk, benefits, alternatives of right hip trochanteric bursa injection, patient elected to proceed.  The patient was in the lateral decubitus position with the right hip up.  The skin over the greater trochanter was prepped with alcohol based prep.  Ethyl chloride was used to anesthetize the skin.  A 20-gauge needle was used to inject 1 cc of bupivacaine, 1 cc of lidocaine, 1 cc of Depo-Medrol under standard sterile technique.  Needle was taken out and band aid was applied. Patient tolerated the procedure well.   ___________________________________________________________________________  History: Patient has had over a year of lateral and anterior hip pain.  The majority of his pain is over the lateral aspect hip.  He feels it on a daily basis.  He says it is worse if he lays directly on that side.  He does not have any pain radiating down the lower extremity.  There is no trauma or injury that preceded the onset of pain.   Physical Exam:  General: no acute distress, appears stated age Neurologic: alert, answering questions appropriately, following commands Respiratory: unlabored breathing on room air, symmetric chest rise Psychiatric: appropriate affect, normal cadence to speech  MSK:   -Right lower extremity  TTP over the lateral aspect of the hip, no other tenderness palpation  Positive FADIR, no pain through remainder of range of motion, negative FABER, negative SI joint compression test EHL/TA/GSC intact Plantarflexes and dorsiflexes toes Sensation  intact to light touch in sural, saphenous, tibial, deep peroneal, and superficial peroneal nerve distributions Foot warm and well perfused  Imaging: XRs of the right hip from 06/16/2023 were independently reviewed and interpreted, 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  No other significant degenerative changes seen.  No fracture or dislocation seen.   Patient name: Edward Glass Patient MRN: [id number] Date: 06/16/23

## 2023-07-15 ENCOUNTER — Other Ambulatory Visit: Payer: Self-pay | Admitting: Adult Health

## 2023-08-18 ENCOUNTER — Telehealth: Payer: Self-pay | Admitting: Adult Health

## 2023-08-18 MED ORDER — CLOPIDOGREL BISULFATE 75 MG PO TABS: 75.0000 mg | ORAL_TABLET | Freq: Every day | ORAL | 0 refills | Status: DC

## 2023-08-18 NOTE — Telephone Encounter (Signed)
Last seen on 04/06/23 Follow up scheduled on 10/20/23 Rx sent

## 2023-08-18 NOTE — Telephone Encounter (Signed)
Pt is needing a refill request sent in for his clopidogrel (PLAVIX) 75 MG tablet  to the [facility] on [address]

## 2023-08-23 ENCOUNTER — Telehealth (INDEPENDENT_AMBULATORY_CARE_PROVIDER_SITE_OTHER): Payer: Self-pay | Admitting: Otolaryngology

## 2023-08-23 NOTE — Telephone Encounter (Signed)
confirmed appt & location [id number] afm

## 2023-08-24 ENCOUNTER — Encounter (INDEPENDENT_AMBULATORY_CARE_PROVIDER_SITE_OTHER): Payer: Self-pay

## 2023-08-24 ENCOUNTER — Ambulatory Visit (INDEPENDENT_AMBULATORY_CARE_PROVIDER_SITE_OTHER): Payer: Medicare Other | Admitting: Otolaryngology

## 2023-08-24 VITALS — BP 129/79 | HR 70 | Ht 65.0 in | Wt 170.0 lb

## 2023-08-24 DIAGNOSIS — H6123 Impacted cerumen, bilateral: Secondary | ICD-10-CM

## 2023-08-24 DIAGNOSIS — H9313 Tinnitus, bilateral: Secondary | ICD-10-CM

## 2023-08-24 DIAGNOSIS — H903 Sensorineural hearing loss, bilateral: Secondary | ICD-10-CM

## 2023-08-25 DIAGNOSIS — H6123 Impacted cerumen, bilateral: Secondary | ICD-10-CM | POA: Insufficient documentation

## 2023-08-25 DIAGNOSIS — H903 Sensorineural hearing loss, bilateral: Secondary | ICD-10-CM | POA: Insufficient documentation

## 2023-08-25 DIAGNOSIS — H9313 Tinnitus, bilateral: Secondary | ICD-10-CM | POA: Insufficient documentation

## 2023-08-25 NOTE — Progress Notes (Signed)
Patient ID: Edward Glass, male   DOB: 09-Mar-1956, 68 y.o.   MRN: [id number]  Follow-up: Bilateral tinnitus  HPI: The patient is a 68 year old male who returns today for his follow-up evaluation.  The patient was last seen 1 year ago.  At that time, he was complaining of bilateral tinnitus.  His ear canals, tympanic membranes, and middle ear spaces were normal.  He was noted to have bilateral symmetric high-frequency sensorineural hearing loss.  The strategies to cope with tinnitus were discussed.  The patient returns today complaining of persistent bilateral tinnitus.  The tinnitus is nonpulsatile.  He denies any recent change in his hearing.  He complains of cerumen accumulation in his ears.  Currently he denies any otalgia or otorrhea.  Exam: General: Communicates without difficulty, well nourished, no acute distress. Head: Normocephalic, no evidence injury, no tenderness, facial buttresses intact without stepoff. Face/sinus: No tenderness to palpation and percussion. Facial movement is normal and symmetric. Eyes: PERRL, EOMI. No scleral icterus, conjunctivae clear. Neuro: CN II exam reveals vision grossly intact.  No nystagmus at any point of gaze. Ears: Auricles well formed without lesions.  Bilateral cerumen impaction.  Nose: External evaluation reveals normal support and skin without lesions.  Dorsum is intact.  Anterior rhinoscopy reveals congested mucosa over anterior aspect of inferior turbinates and intact septum.  No purulence noted. Oral:  Oral cavity and oropharynx are intact, symmetric, without erythema or edema.  Mucosa is moist without lesions. Neck: Full range of motion without pain.  There is no significant lymphadenopathy.  No masses palpable.  Thyroid bed within normal limits to palpation.  Parotid glands and submandibular glands equal bilaterally without mass.  Trachea is midline.   Procedure: Bilateral cerumen disimpaction Anesthesia: None Description: Under the operating  microscope, the cerumen is carefully removed with a combination of cerumen currette, alligator forceps, and suction catheters.  After the cerumen is removed, the TMs are noted to be normal.  No mass, erythema, or lesions. The patient tolerated the procedure well.    Assessment: 1.  Bilateral cerumen impaction.  After the cerumen removal procedure, both tympanic membranes and middle ear spaces are noted to be normal. 2.  Subjectively stable bilateral high-frequency sensorineural hearing loss. 3.  Bilateral tinnitus, likely secondary to his sensorineural hearing loss.  Plan: 1.  Otomicroscopy with bilateral cerumen disimpaction. 2.  The physical exam findings are reviewed with the patient. 3.  The strategies to cope with tinnitus, including the use of masker, hearing aids, tinnitus retraining therapy, and avoidance of caffeine and alcohol are discussed. 4.  The patient will return for reevaluation in 1 year.

## 2023-09-02 ENCOUNTER — Encounter (HOSPITAL_COMMUNITY): Payer: Self-pay

## 2023-09-02 ENCOUNTER — Emergency Department (HOSPITAL_COMMUNITY): Payer: Medicare Other

## 2023-09-02 ENCOUNTER — Other Ambulatory Visit: Payer: Self-pay

## 2023-09-02 ENCOUNTER — Emergency Department (HOSPITAL_COMMUNITY)
Admission: EM | Admit: 2023-09-02 | Discharge: 2023-09-02 | Disposition: A | Discharge status: Home / Self Care | Payer: Medicare Other | Attending: Emergency Medicine | Admitting: Emergency Medicine

## 2023-09-02 DIAGNOSIS — Z7902 Long term (current) use of antithrombotics/antiplatelets: Secondary | ICD-10-CM | POA: Diagnosis not present

## 2023-09-02 DIAGNOSIS — R202 Paresthesia of skin: Secondary | ICD-10-CM | POA: Diagnosis present

## 2023-09-02 DIAGNOSIS — R519 Headache, unspecified: Secondary | ICD-10-CM | POA: Insufficient documentation

## 2023-09-02 DIAGNOSIS — R531 Weakness: Secondary | ICD-10-CM | POA: Insufficient documentation

## 2023-09-02 LAB — URINALYSIS, ROUTINE W REFLEX MICROSCOPIC
Bilirubin Urine: NEGATIVE
Glucose, UA: NEGATIVE mg/dL
Hgb urine dipstick: NEGATIVE
Ketones, ur: NEGATIVE mg/dL
Leukocytes,Ua: NEGATIVE
Nitrite: NEGATIVE
Protein, ur: NEGATIVE mg/dL
Specific Gravity, Urine: 1.01 (ref 1.005–1.030)
pH: 5 (ref 5.0–8.0)

## 2023-09-02 LAB — BASIC METABOLIC PANEL
Anion gap: 9 (ref 5–15)
BUN: 27 mg/dL — ABNORMAL HIGH (ref 8–23)
CO2: 23 mmol/L (ref 22–32)
Calcium: 9.4 mg/dL (ref 8.9–10.3)
Chloride: 102 mmol/L (ref 98–111)
Creatinine, Ser: 0.78 mg/dL (ref 0.61–1.24)
GFR, Estimated: >60 mL/min (ref >60)
Glucose, Bld: 115 mg/dL — ABNORMAL HIGH (ref 70–99)
Potassium: 3.7 mmol/L (ref 3.5–5.1)
Sodium: 134 mmol/L — ABNORMAL LOW (ref 135–145)

## 2023-09-02 LAB — CBC
HCT: 58.3 % — ABNORMAL HIGH (ref 39.0–52.0)
Hemoglobin: 18.7 g/dL — ABNORMAL HIGH (ref 13.0–17.0)
MCH: 29.1 pg (ref 26.0–34.0)
MCHC: 32.1 g/dL (ref 30.0–36.0)
MCV: 90.7 fL (ref 80.0–100.0)
Platelets: 292 10*3/uL (ref 150–400)
RBC: 6.43 MIL/uL — ABNORMAL HIGH (ref 4.22–5.81)
RDW: 13.3 % (ref 11.5–15.5)
WBC: 6 10*3/uL (ref 4.0–10.5)
nRBC: 0 % (ref 0.0–0.2)

## 2023-09-02 NOTE — ED Provider Notes (Signed)
 [facility]MENT AT [facility] Provider Note   CSN: [id number] Arrival date & time: 09/02/23  1321     History  Chief Complaint  Patient presents with   Weakness    Edward Glass is a 68 y.o. male with a prior history of stroke and residual right-sided weakness ED complaining of paresthesias in his entire body but predominantly of the left arm and leg, on and off for about 3 weeks.  Also has intermittent right-sided headaches, although this has been chronic since he had a stroke about 5 years ago.  Denies any chest pain or pressure, fevers or chills.  HPI     Home Medications Prior to Admission medications   Medication Sig Start Date End Date Taking? Authorizing Provider  acetaminophen  (TYLENOL ) 325 MG tablet Take 2 tablets (650 mg total) by mouth every 4 (four) hours as needed for mild pain (or temp > 37.5 C (99.5 F)). 09/29/18   Angiulli, Toribio PARAS, PA-C  albuterol (VENTOLIN HFA) 108 (90 Base) MCG/ACT inhaler as needed for shortness of breath. 06/08/21   [provider]  amLODipine  (NORVASC ) 5 MG tablet Take 5 mg by mouth daily. 03/25/20   [provider]  clopidogrel  (PLAVIX ) 75 MG tablet Take 1 tablet (75 mg total) by mouth daily. 08/18/23   Whitfield Raisin, NP  diclofenac  Sodium (VOLTAREN ) 1 % GEL Apply topically in the morning and at bedtime. 03/27/19   [provider]  ezetimibe  (ZETIA ) 10 MG tablet Take 1 tablet (10 mg total) by mouth daily. 04/19/23   Whitfield Raisin, NP  FLUoxetine  (PROZAC ) 20 MG capsule Take by mouth. 12/30/21   [provider]  Multiple Vitamin (MULTIVITAMIN PO) Take by mouth daily.    [provider]  pravastatin  (PRAVACHOL ) 20 MG tablet Take 1 tablet (20 mg total) by mouth daily. 04/06/23   Whitfield Raisin, NP  tiZANidine  (ZANAFLEX ) 2 MG tablet Take 1 tablet (2 mg total) by mouth in the morning and at bedtime. 05/18/23   Whitfield Raisin, NP      Allergies    Patient has no known allergies.     Review of Systems   Review of Systems  Physical Exam Updated Vital Signs BP (!) 142/72   Pulse (!) 55   Temp 98 F (36.7 C) (Oral)   Resp 18   Ht 5' 5 (1.651 m)   Wt 77.1 kg   SpO2 98%   BMI 28.29 kg/m  Physical Exam Constitutional:      General: He is not in acute distress. HENT:     Head: Normocephalic and atraumatic.  Eyes:     Conjunctiva/sclera: Conjunctivae normal.     Pupils: Pupils are equal, round, and reactive to light.  Cardiovascular:     Rate and Rhythm: Normal rate and regular rhythm.  Pulmonary:     Effort: Pulmonary effort is normal. No respiratory distress.  Abdominal:     General: There is no distension.     Tenderness: There is no abdominal tenderness.  Skin:    General: Skin is warm and dry.  Neurological:     Mental Status: He is alert. Mental status is at baseline.     Comments: Right-sided 4 out of 5 upper and lower body strength at baseline from prior stroke per patient, remainder of neuroexam is normal  Psychiatric:        Mood and Affect: Mood normal.        Behavior: Behavior normal.  ED Results / Procedures / Treatments   Labs (all labs ordered are listed, but only abnormal results are displayed) Labs Reviewed  BASIC METABOLIC PANEL - Abnormal; Notable for the following components:      Result Value   Sodium 134 (*)    Glucose, Bld 115 (*)    BUN 27 (*)    All other components within normal limits  CBC - Abnormal; Notable for the following components:   RBC 6.43 (*)    Hemoglobin 18.7 (*)    HCT 58.3 (*)    All other components within normal limits  URINALYSIS, ROUTINE W REFLEX MICROSCOPIC - Abnormal; Notable for the following components:   Color, Urine STRAW (*)    All other components within normal limits    EKG None  Radiology MR BRAIN WO CONTRAST Result Date: 09/02/2023 CLINICAL DATA:  Left leg weakness, stroke suspected EXAM: MRI HEAD WITHOUT CONTRAST TECHNIQUE: Multiplanar, multiecho pulse sequences of the  brain and surrounding structures were obtained without intravenous contrast. COMPARISON:  03/25/2022 FINDINGS: Brain: No restricted diffusion to suggest acute or subacute infarct. No acute hemorrhage, mass, mass effect, or midline shift. No hydrocephalus or extra-axial collection. Pituitary and craniocervical junction within normal limits. Remote infarcts in the left basal ganglia and corona radiata, which are associated with some hemosiderin deposition, likely petechial hemorrhage. No other significant hemosiderin deposition to suggest remote hemorrhage or superficial siderosis. Ex vacuo dilatation of the left lateral ventricle. T2 hyperintense signal in the periventricular white matter, likely the sequela of mild chronic small vessel ischemic disease. Vascular: Normal arterial flow voids. Skull and upper cervical spine: Normal marrow signal. Sinuses/Orbits: Minimal mucosal thickening in the ethmoid air cells. No acute finding in the orbits. Other: The mastoid air cells are well aerated. IMPRESSION: No acute intracranial process. No evidence of acute or subacute infarct. Electronically Signed   By: Donald Campion M.D.   On: 09/02/2023 15:12    Procedures Procedures    Medications Ordered in ED Medications - No data to display  ED Course/ Medical Decision Making/ A&P                                 Medical Decision Making Amount and/or Complexity of Data Reviewed Labs: ordered.   Patient is presenting with intermittent paresthesias.  Differential includes CVA versus metabolic derangement versus neuropathy versus complex migraine versus other.  I personally reviewed and interpreted the patient's MRI and labs, ordered from triage.  No acute stroke on MRI.  Blood tests are largely unremarkable, no significant electrolyte derangement.  UA without evidence of infection.  Low suspicion for ACS, aortic dissection.  The patient is essentially asymptomatic at this time.  I recommend he would follow-up  with his neurologist for this issue.  The patient and his girlfriend at bedside are comfortable with this plan.  Stable for discharge        Final Clinical Impression(s) / ED Diagnoses Final diagnoses:  Paresthesias    Rx / DC Orders ED Discharge Orders     None         Cottie Donnice PARAS, MD 09/02/23 1731

## 2023-09-02 NOTE — Discharge Instructions (Addendum)
 Please call to schedule follow-up appointment with your neurology clinic.

## 2023-09-02 NOTE — ED Triage Notes (Signed)
 Patient said 2 weeks ago he began having left sided weakness in his arm and leg. Face feels tingly all over. Had a stroke 5 years ago. He feels that he had another. Has headaches constantly.

## 2023-09-02 NOTE — ED Provider Triage Note (Signed)
 Emergency Medicine Provider Triage Evaluation Note  Edward Glass , a 68 y.o. male  was evaluated in triage.  Pt complains of L sided arm and leg weakness x 2 weeks. 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 Started having tightness in his L leg, now his L arm. Worsening starting yesterday throughout the day. Chronic numbness in bilateral feet, not much different now.   Review of Systems  Positive: Weakness Negative: Headache, vision changes, dizziness, slurred speech, facial droop  Physical Exam  BP (!) 149/86   Pulse 64   Temp 98.2 F (36.8 C) (Oral)   Resp 18   Ht 5' 5 (1.651 m)   Wt 77.1 kg   SpO2 100%   BMI 28.29 kg/m  Gen:   Awake, no distress   Resp:  Normal effort  MSK:   Moves extremities without difficulty  Other:   Cannot complete pronator drift due to R sided deficits (chronic) 5/5 grip strength bilaterally, normal sensation of hands Decreased sensation of RIGHT face compared to L 5/5 strength to bilateral knee extension, 4/5 L knee flexion, 3/5 R knee flexion 3/5 on L ankle plantar/dorsiflexion, 2/5 on right  Medical Decision Making  Medically screening exam initiated at 1:47 PM.  Appropriate orders placed.  Edward Glass was informed that the remainder of the evaluation will be completed by another provider, this initial triage assessment does not replace that evaluation, and the importance of remaining in the ED until their evaluation is complete.  Initial workup ordered, will also order MRI brain wo   Edward Starlin T, PA-C 09/02/23 1351

## 2023-09-07 NOTE — Progress Notes (Unsigned)
[facility] [address] [place]. [place] [address] [phone]       STROKE FOLLOW UP NOTE  [name] Date of Birth:  Jan 14, 1956 Medical Record Number:  [id number]   Reason for visit: Upper and lower extremity paresthesias, weakness and pain [facility] provider: Dr. Pearlean Brownie PCP: Edward Mis, MD     CHIEF COMPLAINT:  Chief Complaint  Patient presents with   Follow-up    Pt in room 3, alone. Here for stroke follow up. Pt was ER last week thought he was having a stroke. Pt said bilateral arm numbness is worse. bilateral legs have tingling. Pt reports headaches for about 3 week which is new, takes ibuprofen. Headaches comes and goes.       HPI:  Update 09/08/2023 JM: Patient returns for sooner scheduled visit due to complaints of intermittent bilateral upper and lower extremity paresthesias.  Onset about 4 weeks ago, was seen in the ED 2/7 for these complaints, MRI brain negative for acute stroke and lab work largely unremarkable without significant electrolyte derangement, UA without evidence of infection.  He was advised to follow-up outpatient.  He reports continued upper and lower extremity numbness which is constant, worsens with ambulation, feels like upper extremity numbness gradually worsening. Symptoms from fingertips to shoulder and toes to hips. He also complains of increased right or left sided headaches for the past 3 weeks, occur daily, usually last 15-60 minutes. Has been taking ibuprofen twice daily, previously using ibuprofen at least once daily for "a long time".  He does have history of headaches since his stroke.  Headaches not associated with photophobia, phonophobia or N/V. He also feels increased weakness in lower extremities and worsening right sided spasticity. He complains of generalized pain.  He continues to try to stay active but this has been more difficult recently.  He has had greater difficulty ambulating due to lower extremity  weakness and pain, continues to use pain.  He does have occasional low back pain but does not necessarily feel pain radiating down his legs.  Denies neck pain. Denies any change to medications or lifestyle at onset, denies any traumatic event.  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  Reports compliance on aspirin and pravastatin.        History provided for reference purposes only Update 04/06/2023 JM: Patient returns for follow-up visit.  At prior visit, started on Topamax for likely tension headaches as well as increased baclofen dosage for increased right sided spasticity.  During the interval time, baclofen switched to tizanidine as no benefit with higher baclofen dosage and discontinued topiramate due to side effects. He continues to complain of worsening right sided stiffness, PCP recently reduced Crestor dosage to half pill as he believes statin is making stiffness worse, did not some improvement with lowering dose currently on 5mg  daily, he questions other form of HLD treatment. Currently taking tizanidine 2mg  twice daily, noted increased spasticity in the morning when only taking at night, tolerating well but can cause some fatigue.  Continues to stay active working out routinely, ambulates with cane, denies any recent falls.  Still having headaches occasionally but not as frequent. Denies new stroke/TIA symptoms. Routinely follows with PCP for stroke risk factor management.  Update 09/28/2022 JM: Patient returns for 63-month stroke follow-up.  He complains of increased right sided stiffness over the past month, also mentions pressure type headaches, frontal area, over the past 6 months.  Denies any changes in vision, denies  photophobia or phonophobia.  Use of baclofen 5mg  TID but denies much benefit at current dosage. Received Botox by Dr. Wynn Banker back in October but did not f/u as no benefit.  He also complains of tinnitus, was seen by audiology and was told he has  some hearing loss contributing to symptoms.  Denies new stroke/TIA symptoms.   Compliant on Crestor and Plavix.  He is requesting Crestor dosage to be lowered as he feels this is contributing to increased stiffness as this worsens in the evening time after taking his Crestor.  Currently on 20 mg daily.  Blood pressure well-controlled.  Loop recorder reached end of service 05/2022 without evidence of A-fib during monitoring, has this removed by Dr. Ladona Ridgel back in December.  Completed sleep study which did not show any significant sleep apnea requiring treatment.  Update 03/30/2022 JM: Patient returns for hospital follow-up.  He was evaluated in ED on 8/31 after presenting with left-sided headache lasting approximately 1 minute followed by intermittent left cheek numbness and LLE paresthesia lasting for approx 1 hour. Completed MRI brain which was negative for any findings. Per ED note, right sided deficits at baseline and no evidence of left-sided deficits.  No reoccurrence of left sided symptoms or headaches. No new stroke symptoms. Continued right-sided spasticity which he feels continues to worsen. Has been having more right sided hip pain, is considering doing PRP injection as this previously was beneficial.  Continued use of cane, denies any recent falls.  Continues to workout routinely.  Compliant on Crestor and Plavix.  Blood pressure 120/75.  Loop recorder has not shown atrial fibrillation thus far.  Evaluated by Dr. [name] 7/27 with plans on pursuing sleep study currently scheduled on 9/25  No further concerns at this time  Update 01/04/2022 JM: Patient returns for acute visit after calling on 6/7 reporting worsening right arm and leg weakness lasting a few minutes prior to returning to baseline which occurred approx 3 weeks ago and requesting an MRI brain to rule out new stroke.  He did not seek any emergent evaluation at time or onset.  Denies any other associated symptoms such as headache,  visual changes, dizziness or cognitive changes.  Reports since that time, he has had fluctuation of right leg heaviness sensation typically towards the end of the day or with increased activity.  He also notes fluctuation of speech over the past month.  Discussed concern of worsening spasticity at prior visit, referred to PT/OT for which he was evaluated for but did not have any additional sessions as advised.  He believes his spasticity has continued to worsen. He was seen by Dr. Wynn Banker 6/8 who referred him to OT for eval of Vivistim for continued RUE deficits.  Was seen by PCP 6/7 and restarted on Prozac for worsening depression.  Reports compliance on Plavix and Crestor, denies side effects.  Blood pressure today 135/79.  Loop recorder has not shown atrial fibrillation thus far. He does note day time fatigue, not feeling refreshed upon awakening and having nocturia approx 5-6x per night.  Denies snoring, witnessed apneas or morning headaches.  No further concerns at this time.  Update 07/23/2021 JM: patient returns for 6 months stroke follow up. C/o right sided spasticity with slight increase in spasticity at times over the past couple of months, continues on baclofen 5 mg 3 times daily. Use of quad cane - no recent falls. Interested in doing additional therapy if able.  Dysarthria stable. No new stroke symptoms.  Compliant on Plavix  and Crestor.  Blood pressure today 142/84.  Loop recorder has not shown atrial fibrillation thus far.  No further concerns.  Update 01/15/2021 JM: Mr. Blyth returns for 41-month stroke follow-up unaccompanied.  He has been stable from stroke standpoint without new stroke/TIA symptoms and residual right sided spasticity and dysarthria.  Continues to use quad cane and denies any recent falls.  Use of baclofen 5 mg 3 times daily with benefit.  Compliant on Plavix without associated side effects.  He is concerned regarding possible statin myalgias as he will experience [name] pain  with cramping shortly after taking atorvastatin.  Blood pressure today 137/81.  Loop recorder has not shown atrial fibrillation thus far.  Underwent right total shoulder arthroplasty 12/16/2020 by Dr. August Saucer without complication and recently started therapy.  No new concerns at this time.  Update 07/15/2020 JM: Mr. Boardley returns for 42-month stroke follow-up unaccompanied. 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 Has been using baclofen for spasticity 5mg  three times daily but does experience increased stiffness upon awakening and towards the end of the day. Feels as though speech has been improving. Denies new or worsening stroke/TIA symptoms. At prior visit, complained of right shoulder pain worsened post stroke. Evaluated by orthopedics with note personally reviewed and x-ray revealing severe end-stage OA with progression compared to prior imaging in 08/2018.  He has since received 2 injections with benefit.  Remains on Plavix and atorvastatin without side effects for secondary stroke prevention. Blood pressure today 127/77. Loop recorder has not shown atrial fibrillation thus far.  Previously on Prozac for depression/anxiety post stroke but he has since been able to discontinue this without difficulty.  No further concerns at this time.  Update 01/10/2020 JM: Mr. Higham returns for follow-up regarding stroke in 08/2018.  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  Denies new stroke/TIA symptoms.  Currently ambulating with a quad cane and denies any recent falls.  Reports typically caring cane at his side "just in case" but he is here to walk without it.  Initiated baclofen 5 mg twice daily as needed for spasticity.  Difficulty tolerating daytime dose due to increased fatigue has continued 5 mg dosage at night with only mild benefit.  He questions benefit of restarting Botox as he only received 2 rounds.  He does report right shoulder  pain which is chronic but worsened post stroke.  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  Continues on clopidogrel and atorvastatin for secondary stroke prevention.  Prior lipid panel on 10/09/2019 by PCP showed LDL 51.  Blood pressure today 122/77.  Loop recorder is not shown atrial fibrillation thus far.  No further concerns at this time.  Update 09/06/2019 JM: Mr. Vanoverbeke is a 68 year old male who is being seen today for cryptogenic stroke follow-up.  He was previously seen via virtual visit on 11/08/2018 and has not previously followed up as recommended.  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  He continues to ambulate with a Rollator walker and denies any recent falls.  Greatest concern today is including continued right-sided spasticity.  Trial of Botox injections but denies benefit as well as trial of Zanaflex but patient self discontinued due to reported side effect of upset stomach.  Completed 3 months DAPT and continues on Plavix alone without bleeding or bruising.  Continues on atorvastatin without myalgias.  Blood pressure today 116/78.  Continues to follow with PCP regularly for HTN and HLD management/monitoring.  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  Loop recorder has not shown atrial fibrillation thus far.  Denies new or worsening stroke/TIA symptoms.  Initial visit via virtual 11/08/2018: He has been stable from a stroke standpoint with residual deficits of aphasia, cognitive deficits and right hemiparesis but does endorse improvement.  He continues to participate at neuro rehab PT/OT/ST along with continuing exercises at home.  He is currently ambulating with a quad cane and denies any recent falls.  He  does need some assistance with bathing and dressing but continues to be able to do more on his own each day.  He no longer has any difficulties with swallowing and is currently on a regular diet.  He does endorse increased salivation.  He continues on aspirin and Plavix without side effects of bleeding or bruising.  Continues on atorvastatin without side effects myalgias.  Blood pressures not routinely monitored at home and encourage daughter and patient to obtain cough to start monitoring.  He does endorse occasional depression difficulties such as feeling down or increased anxiety.  It was recommended to initiate Prozac during hospital admission but states once he went home, he did not continue as he felt it was not needed.  Per review of loop recorder, no report of atrial fibrillation found but per patient and daughter, they were contacted by cardiology stating arrhythmia had been found and is in the process of scheduling visit with cardiology.  No further concerns at this time.  Denies new or worsening stroke/TIA symptoms.  Stroke admission 08/30/2018: Mr. Bricco is a 68 year old male who presented to [facility] ED with right-sided weakness and aphasia.   CT head reviewed which showed age indeterminate small vessel ischemia in the left BG.  CTA showed possible left M1 occlusion versus severe stenosis.  CT perfusion no infarct for but large penumbra.  MRI  brain reviewed and showed left MCA patchy infarcts mostly concentrated in the left BG. He arrived greater than 24h from time since last well, therefore no acute stroke interventions done.  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  He unfortunately had neurological worsening with severe aphasia and dense right hemiplegia with extension of deficits on 09/01/2018.  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   When pts dtrs arrive, they both explain  that in 2016 after tricep repair surgery they noted a possible arrhythmia and placed him on holter monitor for 30d, but was unrevealing.  2D echo showed an EF of 55 to 60%.  TCD bubble study negative for PFO.  Lower extremity venous Dopplers negative for DVT.  TEE showed small PFO/bubble crossover noted during Valsalva which was not felt to be clinically significant along with no evidence of cardiac thrombus therefore loop recorder placed.   Recommended DAPT for 3 months then Plavix alone due to severe left M1 stenosis.  HTN stable recommended long-term BP goal 1 30-1 50 due left M1 stenosis.  HLD 104 and initiated atorvastatin 40 mg daily.  Other stroke risk factors include family history of stroke but no personal history of stroke.  Other active problems including reactive depression as he previously worked as a Aeronautical engineer and  overall healthy and was devastated with diagnosis and residual deficits.  Initiated Prozac on 08/31/2018.  He had residual deficits of right hemiparesis, dysarthria and mixed aphasia and was discharged to [facility] for ongoing therapy.      ROS:   14 system review of systems performed and negative with exception of those listed in HPI  PMH:  Past Medical History:  Diagnosis Date   Arthritis    right shoulder   Atypical chest pain 08/10/2013   BPH (benign prostatic hyperplasia)    COVID-19 07/2020   CVA (cerebral vascular accident) (HCC) 08/2018   spastic hemiparesis of right dominant side   Dysphagia    Elevated aspartate aminotransferase level 06/16/2015   Excessive salivation 06/16/2015   H/O nutritional disorder 06/16/2015   H/O vitamin D deficiency    History of loop recorder 2020   Hx of adenomatous colonic polyps 2016   Hyperlipidemia    Hypertension    Hypogonadism male 08/21/2014   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   Screening for prostate cancer 08/21/2014   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    PSH:  Past Surgical History:  Procedure Laterality Date    COLONOSCOPY     HERNIA REPAIR  2006   left tricep surgery     LOOP RECORDER INSERTION N/A 09/04/2018   Procedure: LOOP RECORDER INSERTION;  Surgeon: Marinus Maw, MD;  Location: [facility] INVASIVE CV LAB;  Service: Cardiovascular;  Laterality: N/A;   TEE WITHOUT CARDIOVERSION N/A 09/04/2018   Procedure: TRANSESOPHAGEAL ECHOCARDIOGRAM (TEE);  Surgeon: Jake Bathe, MD;  Location: [facility] ENDOSCOPY;  Service: Cardiovascular;  Laterality: N/A;   TOTAL SHOULDER ARTHROPLASTY Right 12/16/2020   Procedure: RIGHT TOTAL SHOULDER ARTHROPLASTY;  Surgeon: Cammy Copa, MD;  Location: [facility] OR;  Service: Orthopedics;  Laterality: Right;    Social History:  Social History   Socioeconomic History   Marital status: Divorced    Spouse name: Not on file   Number of children: 2   Years of education: Not on file   Highest education level: Not on file  Occupational History   Occupation: Systems analyst  Tobacco Use   Smoking status: Never   Smokeless tobacco: Never  Vaping Use   Vaping status: Never Used  Substance and Sexual Activity   Alcohol use: No   Drug use: No   Sexual activity: Not on file  Other Topics Concern   Not on file  Social History Narrative   Not on file   Social Drivers of Health   Financial Resource Strain: Medium Risk (05/01/2023)   Received from [facility]   Overall Financial Resource Strain (CARDIA)    Difficulty of Paying Living Expenses: Somewhat hard  Food Insecurity: No Food Insecurity (05/01/2023)   Received from [facility]   Hunger Vital Sign    Worried About Running Out of Food in the Last Year: Never true    Ran Out of Food in the Last Year: Never true  Transportation Needs: No Transportation Needs (05/01/2023)   Received from [facility] - Transportation    Lack of Transportation (Medical): No    Lack of Transportation (Non-Medical): No  Physical Activity: Sufficiently Active (05/01/2023)   Received from [facility]   Exercise Vital Sign     Days of Exercise per Week: 5 days    Minutes of Exercise per Session: 60 min  Stress: No Stress Concern Present (05/01/2023)   Received from [facility] - Occupational Stress Questionnaire  Feeling of Stress : Only a little  Social Connections: Socially Integrated (11/05/2022)   Received from [facility]   Social Network    How would you rate your social network (family, work, friends)?: Good participation with social networks  Intimate Partner Violence: Unknown (05/01/2023)   Received from [facility]   HITS    Physically Hurt: Not on file    Over the last 12 months how often did your partner insult you or talk down to you?: Never    Over the last 12 months how often did your partner threaten you with physical harm?: Never    Over the last 12 months how often did your partner scream or curse at you?: Never    Family History:  Family History  Problem Relation Age of Onset   Kidney failure Mother    Cancer Mother    Prostate cancer Father    Cancer Father    Diabetes Father    Colon cancer Neg Hx    Esophageal cancer Neg Hx    Rectal cancer Neg Hx    Stomach cancer Neg Hx     Medications:   Current Outpatient Medications on File Prior to Visit  Medication Sig Dispense Refill   acetaminophen (TYLENOL) 325 MG tablet Take 2 tablets (650 mg total) by mouth every 4 (four) hours as needed for mild pain (or temp > 37.5 C (99.5 F)).     albuterol (VENTOLIN HFA) 108 (90 Base) MCG/ACT inhaler as needed for shortness of breath.     amLODipine (NORVASC) 5 MG tablet Take 5 mg by mouth daily.     clopidogrel (PLAVIX) 75 MG tablet Take 1 tablet (75 mg total) by mouth daily. 90 tablet 0   diclofenac Sodium (VOLTAREN) 1 % GEL Apply topically in the morning and at bedtime.     ezetimibe (ZETIA) 10 MG tablet Take 1 tablet (10 mg total) by mouth daily. 90 tablet 1   FLUoxetine (PROZAC) 20 MG capsule Take by mouth.     Multiple  Vitamin (MULTIVITAMIN PO) Take by mouth daily.     pravastatin (PRAVACHOL) 20 MG tablet Take 1 tablet (20 mg total) by mouth daily. 30 tablet 5   tiZANidine (ZANAFLEX) 2 MG tablet Take 1 tablet (2 mg total) by mouth in the morning and at bedtime. 180 tablet 1   Current Facility-Administered Medications on File Prior to Visit  Medication Dose Route Frequency Provider Last Rate Last Admin   IncobotulinumtoxinA SOLR 100 Units  100 Units Intramuscular Once Kirsteins, Victorino Sparrow, MD        Allergies:  No Known Allergies   Physical Exam  Vitals:   09/08/23 1243  BP: 114/71  Pulse: 65  Weight: 173 lb 3.2 oz (78.6 kg)  Height: 5\' 5"  (1.651 m)   Body mass index is 28.82 kg/m. No results found.  General: well developed, well nourished, pleasant middle-aged male, seated, in no evident distress Head: head normocephalic and atraumatic.   Neck: supple with no carotid or supraclavicular bruits Cardiovascular: regular rate and rhythm, no murmurs Musculoskeletal: limited bilateral shoulder ROM Skin:  no rash/petichiae Vascular:  Normal pulses all extremities   Neurologic Exam Mental Status: Awake and fully alert. Mild dysarthria with hypophonia. Oriented to place and time. Recent and remote memory intact. Attention span, concentration and fund of knowledge appropriate. Mood and affect appropriate.  Cranial Nerves: Pupils equal, briskly reactive to light. Extraocular movements full without nystagmus. Visual fields full to confrontation. Hearing intact. Facial  sensation intact. Mild right lower facial weakness when smiling.  tongue, palate moves normally and symmetrically.  Motor: Difficulty fully testing strength due to poor effort possibly in setting of pain and giveaway weakness.  Noted very slight right upper and lower extremity weakness which is chronic and increased tone bilateral upper and lower extremity. Sensory.: intact to touch , pinprick , position and vibratory sensation.  Coordination:  Rapid alternating movements slowed in all 4 extremities. Finger-to-nose and heel-to-shin performed accurately on left side.  Unable to adequately assess right side due to increased tone Gait and Station: Arises from chair with mild difficulty d/t pain. Stance is normal. Gait demonstrates  slow hemiplegic gait and stiffened right leg with use of cane.  Tandem walking heel toe not attempted Reflexes: 2+ RUE and [name] and 1+ left side. Toes downgoing.        ASSESSMENT/PLAN: Edward Glass is a 69 y.o. year old male here with left MCA patchy infarct secondary to left M1 occlusion with embolic pattern due to unclear etiology on 08/30/2018 and neurological worsening with extension of deficits on 09/01/2018.  ILR reached end of service 06/2022 s/p removal. No evidence of a fib during monitoring duration. Vascular risk factors include intracranial stenosis, HLD and HTN.  Sleep study negative for sleep apnea.  Prior concerns of worsening right sided deficits, MR brain negative for new/acute abnormalities.  Possible TIA vs complicated migraine 02/2022 after presenting to ED with headache preceded by transient left facial and leg numbness with work up remarkable.  Returns today with complaints of bilateral upper and lower extremity paresthesias/dysesthesias, subjective generalized weakness and pain, and worsening spasticity as well as daily headaches over the past 3 to 4 weeks     Bilateral upper and lower extremity paresthesias: Subjective lower extremity weakness: Generalized pain, worsening spasticity: Worsening headaches: Unclear cause Will obtain lab work to rule out reversible causes Recommend trialing gabapentin 300 mg nightly for dysesthesias and tension type headaches Increase tizanidine 1 week after starting gabapentin, continue 2mg  AM and increase to 4mg  PM Suspect component of rebound headache with daily ibuprofen use, advised to limit use to no more than 2-3 times per week Declines interest in  PT, he was advised to call if he wishes to pursue MRI brain 08/2023 no evidence of acute or subacute infarct CBC and CMP largely unremarkable Will consult with Dr. Pearlean Brownie to see if he recommends any further work up  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   Continues to experience gradual worsening of spasticity.  Adjust tizanidine as noted above Again, discussed scheduling follow-up visit with Dr. Wynn Banker for further treatment options.  Received 1 round of botox and did not f/u as no benefit but advised typically takes a few sessions to see benefit No benefit with baclofen Continue to stay active with routine exercise and use of cane at all times for fall prevention Continue clopidogrel 75 mg daily  and pravastatin for secondary stroke prevention managed/prescribed by PCP S/p ILR removal 06/2022 - no evidence of A-fib during almost 4 yr monitoring  Discussed secondary stroke prevention measures and importance of close PCP follow-up for aggressive stroke risk factor management including BP goal<130/90, HLD with LDL goal<70 and DM with A1c.<7  Stroke labs 09/2022: LDL 55, A1c 5.7 - repeat lab work today     Recommend follow up with Dr. Pearlean Brownie for further evaluation    CC: Edward Mis, MD  I spent 45 minutes of face-to-face  and non-face-to-face time with patient.  This included previsit chart review, lab review, study review, order entry, electronic health record documentation, patient education and discussion regarding above diagnoses and treatment plan and answered all the questions to patient satisfaction  Ihor Austin, [facility]  [facility] [address] [place], [place] [address]  Phone [phone] Fax [phone] Note: This document was prepared with digital dictation and possible smart phrase technology. Any transcriptional errors that result from this process are unintentional.

## 2023-09-08 ENCOUNTER — Encounter: Payer: Self-pay | Admitting: Adult Health

## 2023-09-08 ENCOUNTER — Ambulatory Visit: Payer: Medicare Other | Admitting: Adult Health

## 2023-09-08 VITALS — BP 114/71 | HR 65 | Ht 65.0 in | Wt 173.2 lb

## 2023-09-08 DIAGNOSIS — E785 Hyperlipidemia, unspecified: Secondary | ICD-10-CM | POA: Diagnosis not present

## 2023-09-08 DIAGNOSIS — R519 Headache, unspecified: Secondary | ICD-10-CM

## 2023-09-08 DIAGNOSIS — Z8673 Personal history of transient ischemic attack (TIA), and cerebral infarction without residual deficits: Secondary | ICD-10-CM

## 2023-09-08 DIAGNOSIS — R202 Paresthesia of skin: Secondary | ICD-10-CM

## 2023-09-08 DIAGNOSIS — M6281 Muscle weakness (generalized): Secondary | ICD-10-CM | POA: Diagnosis not present

## 2023-09-08 DIAGNOSIS — M79601 Pain in right arm: Secondary | ICD-10-CM

## 2023-09-08 DIAGNOSIS — M79602 Pain in left arm: Secondary | ICD-10-CM

## 2023-09-08 DIAGNOSIS — R7303 Prediabetes: Secondary | ICD-10-CM

## 2023-09-08 DIAGNOSIS — R52 Pain, unspecified: Secondary | ICD-10-CM

## 2023-09-08 MED ORDER — GABAPENTIN 300 MG PO CAPS
300.0000 mg | ORAL_CAPSULE | Freq: Every day | ORAL | 11 refills | Status: DC
Start: 2023-09-08 — End: 2024-04-25

## 2023-09-08 MED ORDER — TIZANIDINE HCL 2 MG PO TABS: ORAL_TABLET | ORAL | 11 refills | Status: DC

## 2023-09-08 NOTE — Patient Instructions (Addendum)
Your Plan:  Start gabapentin 300 mg nightly  Increase tizanidine after 1 week of starting gabapentin, continue 1 tablet in the morning and take 2 tablets at night  We will check lab work today  Will consult with Dr. Pearlean Brownie to see if he has any further recommendations         Thank you for coming to see [place] at [facility]. I hope we have been able to provide you high quality care today.  You may receive a patient satisfaction survey over the next few weeks. We would appreciate your feedback and comments so that we may continue to improve ourselves and the health of our patients.

## 2023-09-09 NOTE — Progress Notes (Signed)
I agree with the above plan

## 2023-09-14 ENCOUNTER — Encounter: Payer: Self-pay | Admitting: Adult Health

## 2023-09-15 LAB — VITAMIN B1: Thiamine: 145.5 nmol/L (ref 66.5–200.0)

## 2023-09-15 LAB — RHEUMATOID FACTOR: Rheumatoid fact SerPl-aCnc: <10 [IU]/mL (ref <14.0)

## 2023-09-15 LAB — HEMOGLOBIN A1C
Est. average glucose Bld gHb Est-mCnc: 126 mg/dL
Hgb A1c MFr Bld: 6 % — ABNORMAL HIGH (ref 4.8–5.6)

## 2023-09-15 LAB — CK: Total CK: 406 U/L — ABNORMAL HIGH (ref 41–331)

## 2023-09-15 LAB — LIPID PANEL
Chol/HDL Ratio: 2.4 {ratio} (ref 0.0–5.0)
Cholesterol, Total: 157 mg/dL (ref 100–199)
HDL: 65 mg/dL (ref >39)
LDL Chol Calc (NIH): 71 mg/dL (ref 0–99)
Triglycerides: 123 mg/dL (ref 0–149)
VLDL Cholesterol Cal: 21 mg/dL (ref 5–40)

## 2023-09-15 LAB — SEDIMENTATION RATE: Sed Rate: 11 mm/h (ref 0–30)

## 2023-09-15 LAB — B12 AND FOLATE PANEL
Folate: >20 ng/mL (ref >3.0)
Vitamin B-12: 1685 pg/mL — ABNORMAL HIGH (ref 232–1245)

## 2023-09-15 LAB — VITAMIN B6: Vitamin B6: 26 ug/L (ref 3.4–65.2)

## 2023-09-15 LAB — ANA W/REFLEX: Anti Nuclear Antibody (ANA): NEGATIVE

## 2023-09-15 LAB — TSH: TSH: 2.06 u[IU]/mL (ref 0.450–4.500)

## 2023-09-15 LAB — MAGNESIUM: Magnesium: 2.1 mg/dL (ref 1.6–2.3)

## 2023-09-19 DIAGNOSIS — R7303 Prediabetes: Secondary | ICD-10-CM | POA: Insufficient documentation

## 2023-10-03 ENCOUNTER — Telehealth: Payer: Self-pay | Admitting: Adult Health

## 2023-10-03 NOTE — Telephone Encounter (Signed)
 Pt is asking for a call to discuss his concern that the gabapentin (NEURONTIN) 300 MG capsule  is causing his joints to ache, please call.

## 2023-10-03 NOTE — Telephone Encounter (Signed)
 Called the pt back. Pt states that since starting the gabapentin he has noticed that he is having joint pain. He has not stopped the medication because wanted to discuss first but really would like to stop the gabapentin. I asked if he had worked up on increasing the tizanidine and he states that he has not increased because he feels like it will cause him increase drowsiness so he continues to take 1 tizanidine in am and 1 in evening. He would like to know if there is anything else that he could try in place of these that may have fewer side effects?

## 2023-10-03 NOTE — Telephone Encounter (Signed)
 I cannot find where this is a known side effect of gabapentin but he can stop medication to see if symptoms resolve. At this point, may benefit from pain management consult for further recommendations. He is currently on Prozac so would not recommend adding additional antidepressant such as amitriptyline or duloxetine. He is already on a muscle relaxer. Unfortunately, I do not have anything else to office. If interested in pain management consult, can place order. Thank you.

## 2023-10-04 NOTE — Telephone Encounter (Signed)
 Called the patient back. Advised that Shanda Bumps said its ok for him to stop the gabapentin. She looked into other medication options and would not have any further recommendations from her standpoint.advised she would recommend pain management follow up if doesn't find benefit with tizanidine alone. Pt verbalized understanding. He states he will stop gabapentin and let us know if still has issues. Reiterated that if he is feeling like needing relief the next step would be pain management. He has previously been established with Dr Penni Homans at [facility]. Pt verbalized understanding.

## 2023-10-20 ENCOUNTER — Ambulatory Visit: Payer: Medicare Other | Admitting: Adult Health

## 2023-11-02 ENCOUNTER — Other Ambulatory Visit: Payer: Self-pay | Admitting: Urology

## 2023-11-02 DIAGNOSIS — R972 Elevated prostate specific antigen [PSA]: Secondary | ICD-10-CM

## 2023-11-09 ENCOUNTER — Other Ambulatory Visit: Payer: Self-pay | Admitting: Adult Health

## 2023-11-11 ENCOUNTER — Other Ambulatory Visit: Payer: Self-pay | Admitting: Adult Health

## 2023-11-11 ENCOUNTER — Encounter: Payer: Self-pay | Admitting: Urology

## 2023-11-12 ENCOUNTER — Other Ambulatory Visit: Payer: Self-pay | Admitting: Adult Health

## 2023-11-15 ENCOUNTER — Telehealth: Payer: Self-pay | Admitting: Adult Health

## 2023-11-15 MED ORDER — PRAVASTATIN SODIUM 20 MG PO TABS: 20.0000 mg | ORAL_TABLET | Freq: Every day | ORAL | 0 refills | Status: AC

## 2023-11-15 MED ORDER — CLOPIDOGREL BISULFATE 75 MG PO TABS: 75.0000 mg | ORAL_TABLET | Freq: Every day | ORAL | 0 refills | Status: DC

## 2023-11-15 NOTE — Telephone Encounter (Signed)
 Pt is asking for a refill on his clopidogrel  (PLAVIX ) 75 MG tablet, pt is asking for a call to discuss why his request for a refill on the  rosuvastatin  (CRESTOR ) 20 MG tablet has been denied.  Pt still using [id number]

## 2023-11-15 NOTE — Telephone Encounter (Signed)
 Called the patient and advised that they had previously discussed in feb apt about PCP taking over filling the plavix  and the rosuvastatin  going forward since he has remained stable with the medication. Advised we can send a 30 day supply but going forward should discuss with PCP prescribing. Pt verbalized understanding.

## 2023-11-15 NOTE — Addendum Note (Signed)
 Addended by: Elton Ham on: 11/15/2023 02:27 PM   Modules accepted: Orders

## 2023-11-15 NOTE — Telephone Encounter (Signed)
 Can provide short-term refill but request ongoing refills managed by PCP as he was recently advised. Thank you.

## 2023-11-21 ENCOUNTER — Encounter: Payer: Self-pay | Admitting: Neurology

## 2023-11-21 ENCOUNTER — Ambulatory Visit: Payer: Medicare Other | Admitting: Neurology

## 2023-11-21 VITALS — BP 118/65 | HR 58 | Ht 65.0 in | Wt 170.0 lb

## 2023-11-21 DIAGNOSIS — G8111 Spastic hemiplegia affecting right dominant side: Secondary | ICD-10-CM | POA: Diagnosis not present

## 2023-11-21 DIAGNOSIS — G44221 Chronic tension-type headache, intractable: Secondary | ICD-10-CM | POA: Diagnosis not present

## 2023-11-21 DIAGNOSIS — Z8673 Personal history of transient ischemic attack (TIA), and cerebral infarction without residual deficits: Secondary | ICD-10-CM

## 2023-11-21 MED ORDER — BACLOFEN 10 MG PO TABS: 10.0000 mg | ORAL_TABLET | Freq: Three times a day (TID) | ORAL | 0 refills | Status: DC

## 2023-11-21 NOTE — Patient Instructions (Signed)
 I had a long d/w patient and his girlfriend about his remote stroke and spastic right hemiparesis, tension headaches, risk for recurrent stroke/TIAs, personally independently reviewed imaging studies and stroke evaluation results and answered questions.Continue Plavix  75 mg daily for secondary stroke prevention and maintain strict control of hypertension with blood pressure goal below 130/90, diabetes with hemoglobin A1c goal below 6.5% and lipids with LDL cholesterol goal below 70 mg/dL. I also advised the patient to eat a healthy diet with plenty of whole grains, cereals, fruits and vegetables, exercise regularly and maintain ideal body weight .I recommend he taper his Zanaflex  to 1 tablet daily for 1 week and discontinue and instead start baclofen  5 mg twice daily for a week increase to 10 mg twice daily and then if tolerated to 10 mg 3 times daily to help with his spasticity.  I also encouraged him to discontinue Tylenol  and over-the-counter analgesics for his tension headaches and instead do regular neck stretching exercises and participate in regular activities for stress relaxation like meditation, yoga and exercises.  Followup in the future with my nurse practitioner in 6 months or call earlier if necessary.  Neck Exercises Ask your health care provider which exercises are safe for you. Do exercises exactly as told by your health care provider and adjust them as directed. It is normal to feel mild stretching, pulling, tightness, or discomfort as you do these exercises. Stop right away if you feel sudden pain or your pain gets worse. Do not begin these exercises until told by your health care provider. Neck exercises can be important for many reasons. They can improve strength and maintain flexibility in your neck, which will help your upper back and prevent neck pain. Stretching exercises Rotation neck stretching  Sit in a chair or stand up. Place your feet flat on the floor, shoulder-width  apart. Slowly turn your head (rotate) to the right until a slight stretch is felt. Turn it all the way to the right so you can look over your right shoulder. Do not tilt or tip your head. Hold this position for 10-30 seconds. Slowly turn your head (rotate) to the left until a slight stretch is felt. Turn it all the way to the left so you can look over your left shoulder. Do not tilt or tip your head. Hold this position for 10-30 seconds. Repeat __________ times. Complete this exercise __________ times a day. Neck retraction  Sit in a sturdy chair or stand up. Look straight ahead. Do not bend your neck. Use your fingers to push your chin backward (retraction). Do not bend your neck for this movement. Continue to face straight ahead. If you are doing the exercise properly, you will feel a slight sensation in your throat and a stretch at the back of your neck. Hold the stretch for 1-2 seconds. Repeat __________ times. Complete this exercise __________ times a day. Strengthening exercises Neck press  Lie on your back on a firm bed or on the floor with a pillow under your head. Use your neck muscles to push your head down on the pillow and straighten your spine. Hold the position as well as you can. Keep your head facing up (in a neutral position) and your chin tucked. Slowly count to 5 while holding this position. Repeat __________ times. Complete this exercise __________ times a day. Isometrics These are exercises in which you strengthen the muscles in your neck while keeping your neck still (isometrics). Sit in a supportive chair and place your  hand on your forehead. Keep your head and face facing straight ahead. Do not flex or extend your neck while doing isometrics. Push forward with your head and neck while pushing back with your hand. Hold for 10 seconds. Do the sequence again, this time putting your hand against the back of your head. Use your head and neck to push backward against the  hand pressure. Finally, do the same exercise on either side of your head, pushing sideways against the pressure of your hand. Repeat __________ times. Complete this exercise __________ times a day. Prone head lifts  Lie face-down (prone position), resting on your elbows so that your chest and upper back are raised. Start with your head facing downward, near your chest. Position your chin either on or near your chest. Slowly lift your head upward. Lift until you are looking straight ahead. Then continue lifting your head as far back as you can comfortably stretch. Hold your head up for 5 seconds. Then slowly lower it to your starting position. Repeat __________ times. Complete this exercise __________ times a day. Supine head lifts  Lie on your back (supine position), bending your knees to point to the ceiling and keeping your feet flat on the floor. Lift your head slowly off the floor, raising your chin toward your chest. Hold for 5 seconds. Repeat __________ times. Complete this exercise __________ times a day. Scapular retraction  Stand with your arms at your sides. Look straight ahead. Slowly pull both shoulders (scapulae) backward and downward (retraction) until you feel a stretch between your shoulder blades in your upper back. Hold for 10-30 seconds. Relax and repeat. Repeat __________ times. Complete this exercise __________ times a day. Contact a health care provider if: Your neck pain or discomfort gets worse when you do an exercise. Your neck pain or discomfort does not improve within 2 hours after you exercise. If you have any of these problems, stop exercising right away. Do not do the exercises again unless your health care provider says that you can. Get help right away if: You develop sudden, severe neck pain. If this happens, stop exercising right away. Do not do the exercises again unless your health care provider says that you can. This information is not intended to  replace advice given to you by your health care provider. Make sure you discuss any questions you have with your health care provider. Document Revised: 01/06/2021 Document Reviewed: 01/06/2021 Elsevier Patient Education  2024 [employer].

## 2023-11-21 NOTE — Progress Notes (Signed)
 [facility] [address] [place]. [place] [address] [phone]       STROKE FOLLOW UP NOTE  [name] Date of Birth:  06-24-1956 Medical Record Number:  [id number]   Reason for visit: Upper and lower extremity paresthesias, weakness and pain [facility] provider: Dr. Janett Medin PCP: [name], MD     CHIEF COMPLAINT:  Chief Complaint  Patient presents with   New Patient (Initial Visit)    Pt in 16 with girlfriend Pt states tingling,numbness,and tingling in whole body Pt states increased headaches , pt states both legs are heavy Pt states tried Gabapentin  pt states felt worse Pt states leg cramps      HPI: Update 11/21/2023 : He returns for follow-up after last visit with [name] nurse practitioner 2 and half months ago.  Patient had complained of significant paresthesias in extremities and increased weakness.  She had prescribed gabapentin  but the patient discontinued it saying that it made him feel worse.  He complains of increased stiffness on his left side as well as cramps in both legs.  He cannot stand up on his feet for too long.  He is currently on Zanaflex  2 mg twice daily but feels that he was doing better on baclofen  and wants to go back on it.  He has not had any recurrent stroke or TIA symptoms.  He is tolerating Plavix  well without bruising or bleeding.  His blood pressure is under good control today it is 118/65.  He is tolerating Pravachol  well without side effects.  Lab work on 09/08/2023 showed LDL-cholesterol to be borderline at 71 mg percent.  Vitamin B1, B6, B12, folate were normal.  TSH and ESR were normal.  Rheumatoid factor and ANA were also negative.  Hemoglobin A1c on 09/15/2023 was 6.0.  MRI scan of the brain on 09/02/2023 showed no acute abnormality. Update 09/08/2023 JM: Patient returns for sooner scheduled visit due to complaints of intermittent bilateral upper and lower extremity paresthesias.  Onset about 4 weeks ago, was seen in the ED 2/7  for these complaints, MRI brain negative for acute stroke and lab work largely unremarkable without significant electrolyte derangement, UA without evidence of infection.  He was advised to follow-up outpatient.  He reports continued upper and lower extremity numbness which is constant, worsens with ambulation, feels like upper extremity numbness gradually worsening. Symptoms from fingertips to shoulder and toes to hips. He also complains of increased right or left sided headaches for the past 3 weeks, occur daily, usually last 15-60 minutes. Has been taking ibuprofen twice daily, previously using ibuprofen at least once daily for "a long time".  He does have history of headaches since his stroke.  Headaches not associated with photophobia, phonophobia or N/V. He also feels increased weakness in lower extremities and worsening right sided spasticity. He complains of generalized pain.  He continues to try to stay active but this has been more difficult recently.  He has had greater difficulty ambulating due to lower extremity weakness and pain, continues to use pain.  He does have occasional low back pain but does not necessarily feel pain radiating down his legs.  Denies neck pain. Denies any change to medications or lifestyle at onset, denies any traumatic event.  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  Reports compliance on aspirin  and pravastatin .        History provided for reference purposes only Update 04/06/2023 JM: Patient returns for follow-up visit.  At prior  visit, started on Topamax  for likely tension headaches as well as increased baclofen  dosage for increased right sided spasticity.  During the interval time, baclofen  switched to tizanidine  as no benefit with higher baclofen  dosage and discontinued topiramate  due to side effects. He continues to complain of worsening right sided stiffness, PCP recently reduced Crestor  dosage to half pill as he believes statin is  making stiffness worse, did not some improvement with lowering dose currently on 5mg  daily, he questions other form of HLD treatment. Currently taking tizanidine  2mg  twice daily, noted increased spasticity in the morning when only taking at night, tolerating well but can cause some fatigue.  Continues to stay active working out routinely, ambulates with cane, denies any recent falls.  Still having headaches occasionally but not as frequent. Denies new stroke/TIA symptoms. Routinely follows with PCP for stroke risk factor management.  Update 09/28/2022 JM: Patient returns for 69-month stroke follow-up.  He complains of increased right sided stiffness over the past month, also mentions pressure type headaches, frontal area, over the past 6 months.  Denies any changes in vision, denies photophobia or phonophobia.  Use of baclofen  5mg  TID but denies much benefit at current dosage. Received Botox by Dr. Sharl Davies back in October but did not f/u as no benefit.  He also complains of tinnitus, was seen by audiology and was told he has some hearing loss contributing to symptoms.  Denies new stroke/TIA symptoms.   Compliant on Crestor  and Plavix .  He is requesting Crestor  dosage to be lowered as he feels this is contributing to increased stiffness as this worsens in the evening time after taking his Crestor .  Currently on 20 mg daily.  Blood pressure well-controlled.  Loop recorder reached end of service 05/2022 without evidence of A-fib during monitoring, has this removed by Dr. Carolynne Citron back in December.  Completed sleep study which did not show any significant sleep apnea requiring treatment.  Update 03/30/2022 JM: Patient returns for hospital follow-up.  He was evaluated in ED on 8/31 after presenting with left-sided headache lasting approximately 1 minute followed by intermittent left cheek numbness and LLE paresthesia lasting for approx 1 hour. Completed MRI brain which was negative for any findings. Per ED note, right  sided deficits at baseline and no evidence of left-sided deficits.  No reoccurrence of left sided symptoms or headaches. No new stroke symptoms. Continued right-sided spasticity which he feels continues to worsen. Has been having more right sided hip pain, is considering doing PRP injection as this previously was beneficial.  Continued use of cane, denies any recent falls.  Continues to workout routinely.  Compliant on Crestor  and Plavix .  Blood pressure 120/75.  Loop recorder has not shown atrial fibrillation thus far.  Evaluated by Dr. Omar Bibber 7/27 with plans on pursuing sleep study currently scheduled on 9/25  No further concerns at this time  Update 01/04/2022 JM: Patient returns for acute visit after calling on 6/7 reporting worsening right arm and leg weakness lasting a few minutes prior to returning to baseline which occurred approx 3 weeks ago and requesting an MRI brain to rule out new stroke.  He did not seek any emergent evaluation at time or onset.  Denies any other associated symptoms such as headache, visual changes, dizziness or cognitive changes.  Reports since that time, he has had fluctuation of right leg heaviness sensation typically towards the end of the day or with increased activity.  He also notes fluctuation of speech over the past month.  Discussed concern  of worsening spasticity at prior visit, referred to PT/OT for which he was evaluated for but did not have any additional sessions as advised.  He believes his spasticity has continued to worsen. He was seen by Dr. Sharl Davies 6/8 who referred him to OT for eval of Vivistim for continued RUE deficits.  Was seen by PCP 6/7 and restarted on Prozac  for worsening depression.  Reports compliance on Plavix  and Crestor , denies side effects.  Blood pressure today 135/79.  Loop recorder has not shown atrial fibrillation thus far. He does note day time fatigue, not feeling refreshed upon awakening and having nocturia approx 5-6x per night.   Denies snoring, witnessed apneas or morning headaches.  No further concerns at this time.  Update 07/23/2021 JM: patient returns for 6 months stroke follow up. C/o right sided spasticity with slight increase in spasticity at times over the past couple of months, continues on baclofen  5 mg 3 times daily. Use of quad cane - no recent falls. Interested in doing additional therapy if able.  Dysarthria stable. No new stroke symptoms.  Compliant on Plavix  and Crestor .  Blood pressure today 142/84.  Loop recorder has not shown atrial fibrillation thus far.  No further concerns.  Update 01/15/2021 JM: Mr. Tetteh returns for 69-month stroke follow-up unaccompanied.  He has been stable from stroke standpoint without new stroke/TIA symptoms and residual right sided spasticity and dysarthria.  Continues to use quad cane and denies any recent falls.  Use of baclofen  5 mg 3 times daily with benefit.  Compliant on Plavix  without associated side effects.  He is concerned regarding possible statin myalgias as he will experience [name] pain with cramping shortly after taking atorvastatin .  Blood pressure today 137/81.  Loop recorder has not shown atrial fibrillation thus far.  Underwent right total shoulder arthroplasty 12/16/2020 by Dr. Rozelle Corning without complication and recently started therapy.  No new concerns at this time.  Update 07/15/2020 JM: Mr. Nydam returns for 30-month stroke follow-up unaccompanied. 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 Has been using baclofen  for spasticity 5mg  three times daily but does experience increased stiffness upon awakening and towards the end of the day. Feels as though speech has been improving. Denies new or worsening stroke/TIA symptoms. At prior visit, complained of right shoulder pain worsened post stroke. Evaluated by orthopedics with note personally reviewed and x-ray revealing severe end-stage OA with progression compared to prior imaging in 08/2018.  He has since  received 2 injections with benefit.  Remains on Plavix  and atorvastatin  without side effects for secondary stroke prevention. Blood pressure today 127/77. Loop recorder has not shown atrial fibrillation thus far.  Previously on Prozac  for depression/anxiety post stroke but he has since been able to discontinue this without difficulty.  No further concerns at this time.  Update 01/10/2020 JM: Mr. Silverstone returns for follow-up regarding stroke in 08/2018.  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  Denies new stroke/TIA symptoms.  Currently ambulating with a quad cane and denies any recent falls.  Reports typically caring cane at his side "just in case" but he is here to walk without it.  Initiated baclofen  5 mg twice daily as needed for spasticity.  Difficulty tolerating daytime dose due to increased fatigue has continued 5 mg dosage at night with only mild benefit.  He questions benefit of restarting Botox as he only received 2 rounds.  He does report right shoulder pain which is chronic but worsened post stroke.  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  Continues on clopidogrel  and atorvastatin  for secondary stroke prevention.  Prior lipid panel on 10/09/2019 by PCP showed LDL 51.  Blood pressure today 122/77.  Loop recorder is not shown atrial fibrillation thus far.  No further concerns at this time.  Update 09/06/2019 JM: Mr. Stimac is a 68 year old male who is being seen today for cryptogenic stroke follow-up.  He was previously seen via virtual visit on 11/08/2018 and has not previously followed up as recommended.  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  He continues to ambulate with a Rollator  walker and denies any recent falls.  Greatest concern today is including continued right-sided spasticity.  Trial of Botox injections but denies benefit as well as trial of Zanaflex  but patient self discontinued due to reported side effect of upset stomach.  Completed 3 months DAPT and continues on Plavix  alone without bleeding or bruising.  Continues on atorvastatin  without myalgias.  Blood pressure today 116/78.  Continues to follow with PCP regularly for HTN and HLD management/monitoring.  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  Loop recorder has not shown atrial fibrillation thus far.  Denies new or worsening stroke/TIA symptoms.  Initial visit via virtual 11/08/2018: He has been stable from a stroke standpoint with residual deficits of aphasia, cognitive deficits and right hemiparesis but does endorse improvement.  He continues to participate at neuro rehab PT/OT/ST along with continuing exercises at home.  He is currently ambulating with a quad cane and denies any recent falls.  He does need some assistance with bathing and dressing but continues to be able to do more on his own each day.  He no longer has any difficulties with swallowing and is currently on a regular diet.  He does endorse increased salivation.  He continues on aspirin  and Plavix  without side effects of bleeding or bruising.  Continues on atorvastatin  without side effects myalgias.  Blood pressures not routinely monitored at home and encourage daughter and patient to obtain cough to start monitoring.  He does endorse occasional depression difficulties such as feeling down or increased anxiety.  It was recommended to initiate Prozac  during hospital admission but states once he went home, he did not continue as he felt it was not needed.  Per review of loop recorder, no report of atrial fibrillation found but per patient and daughter, they were contacted by cardiology stating arrhythmia had been found and  is in the process of scheduling visit with cardiology.  No further concerns at this time.  Denies new or worsening stroke/TIA symptoms.  Stroke admission 08/30/2018: Mr. Puebla is a 68 year old male who presented to [facility] ED with right-sided weakness and aphasia.   CT head reviewed which showed age indeterminate small vessel ischemia in the left BG.  CTA showed possible left M1 occlusion versus severe stenosis.  CT perfusion no infarct for but large penumbra.  MRI  brain reviewed and showed left MCA patchy infarcts mostly concentrated in the left BG. He arrived greater than 24h from time since last well, therefore no acute stroke interventions done.  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  He unfortunately had neurological worsening with severe  aphasia and dense right hemiplegia with extension of deficits on 09/01/2018.  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   When pts dtrs arrive, they both explain that in 2016 after tricep repair surgery they noted a possible arrhythmia and placed him on holter monitor for 30d, but was unrevealing.  2D echo showed an EF of 55 to 60%.  TCD bubble study negative for PFO.  Lower extremity venous Dopplers negative for DVT.  TEE showed small PFO/bubble crossover noted during Valsalva which was not felt to be clinically significant along with no evidence of cardiac thrombus therefore loop recorder placed.   Recommended DAPT for 3 months then Plavix  alone due to severe left M1 stenosis.  HTN stable recommended long-term BP goal 1 30-1 50 due left M1 stenosis.  HLD 104 and initiated atorvastatin  40 mg daily.  Other stroke risk factors include family history of stroke but no personal history of stroke.  Other active problems including reactive depression as he previously worked as a Aeronautical engineer and overall healthy and was devastated with diagnosis  and residual deficits.  Initiated Prozac  on 08/31/2018.  He had residual deficits of right hemiparesis, dysarthria and mixed aphasia and was discharged to CIR for ongoing therapy.      ROS:   14 system review of systems performed and negative with exception of those listed in HPI  PMH:  Past Medical History:  Diagnosis Date   Arthritis    right shoulder   Atypical chest pain 08/10/2013   BPH (benign prostatic hyperplasia)    COVID-19 07/2020   CVA (cerebral vascular accident) (HCC) 08/2018   spastic hemiparesis of right dominant side   Dysphagia    Elevated aspartate aminotransferase level 06/16/2015   Excessive salivation 06/16/2015   H/O nutritional disorder 06/16/2015   H/O vitamin D deficiency    History of loop recorder 2020   Hx of adenomatous colonic polyps 2016   Hyperlipidemia    Hypertension    Hypogonadism male 08/21/2014   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/07/2012   Screening for prostate cancer 08/21/2014   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    PSH:  Past Surgical History:  Procedure Laterality Date   COLONOSCOPY     HERNIA REPAIR  2006   left tricep surgery     LOOP RECORDER INSERTION N/A 09/04/2018   Procedure: LOOP RECORDER INSERTION;  Surgeon: Tammie Fall, MD;  Location: [facility] INVASIVE CV LAB;  Service: Cardiovascular;  Laterality: N/A;   TEE WITHOUT CARDIOVERSION N/A 09/04/2018   Procedure: TRANSESOPHAGEAL ECHOCARDIOGRAM (TEE);  Surgeon: Hugh Madura, MD;  Location: [facility] ENDOSCOPY;  Service: Cardiovascular;  Laterality: N/A;   TOTAL SHOULDER ARTHROPLASTY Right 12/16/2020   Procedure: RIGHT TOTAL SHOULDER ARTHROPLASTY;  Surgeon: Jasmine Mesi, MD;  Location: [facility] OR;  Service: Orthopedics;  Laterality: Right;    Social History:  Social History   Socioeconomic History   Marital status: Divorced    Spouse name: Not on file   Number of children: 2   Years of education: Not on file   Highest education level: Not on file  Occupational History   Occupation: Patent examiner  Tobacco Use   Smoking status: Never   Smokeless tobacco: Never  Vaping Use   Vaping status: Never Used  Substance and Sexual Activity   Alcohol use: No   Drug use: No   Sexual activity: Not on file  Other Topics Concern   Not on file  Social  History Narrative   Pt lives with family    Pt works    Teacher, early years/pre Strain: Low Risk  (09/19/2023)   Received from [employer]   Overall Financial Resource Strain (CARDIA)    Difficulty of Paying Living Expenses: Not hard at all  Food Insecurity: No Food Insecurity (09/19/2023)   Received [facility]   Hunger Vital Sign    Worried About Running Out of Food in the Last Year: Never true    Ran Out of Food in the Last Year: Never true  Transportation Needs: No Transportation Needs (09/19/2023)   Received from [facility] - Transportation    Lack of Transportation (Medical): No    Lack of Transportation (Non-Medical): No  Physical Activity: Sufficiently Active (05/01/2023)   Received from [facility]   Exercise Vital Sign    Days of Exercise per Week: 5 days    Minutes of Exercise per Session: 60 min  Stress: No Stress Concern Present (05/01/2023)   Received from [facility] - Occupational Stress Questionnaire    Feeling of Stress : Only a little  Social Connections: Socially Integrated (11/05/2022)   Received from [facility]   Social Network    How would you rate your social network (family, work, friends)?: Good participation with social networks  Intimate Partner Violence: Unknown (05/01/2023)   Received from [facility]   HITS    Physically Hurt: Not on file    Over the last 12 months how often did your partner insult you or talk down to you?: Never    Over the last 12 months how often did your partner threaten you with physical harm?: Never    Over the last 12 months how often did your partner scream or  curse at you?: Never    Family History:  Family History  Problem Relation Age of Onset   Kidney failure Mother    Cancer Mother    Prostate cancer Father    Cancer Father    Diabetes Father    Colon cancer Neg Hx    Esophageal cancer Neg Hx    Rectal cancer Neg Hx    Stomach cancer Neg Hx     Medications:   Current Outpatient Medications on File Prior to Visit  Medication Sig Dispense Refill   acetaminophen  (TYLENOL ) 325 MG tablet Take 2 tablets (650 mg total) by mouth every 4 (four) hours as needed for mild pain (or temp > 37.5 C (99.5 F)).     amLODipine  (NORVASC ) 5 MG tablet Take 5 mg by mouth daily.     clopidogrel  (PLAVIX ) 75 MG tablet Take 1 tablet (75 mg total) by mouth daily. 30 tablet 0   FLUoxetine  (PROZAC ) 20 MG capsule Take by mouth.     Multiple Vitamin (MULTIVITAMIN PO) Take by mouth daily.     pravastatin  (PRAVACHOL ) 20 MG tablet Take 1 tablet (20 mg total) by mouth daily. 30 tablet 0   tiZANidine  (ZANAFLEX ) 2 MG tablet TAKE 1 TABLET BY MOUTH IN THE MORNING AND AT BEDTIME 180 tablet 0   albuterol (VENTOLIN HFA) 108 (90 Base) MCG/ACT inhaler as needed for shortness of breath. (Patient not taking: Reported on 11/21/2023)     diclofenac  Sodium (VOLTAREN ) 1 % GEL Apply topically in the morning and at bedtime.     ezetimibe  (ZETIA ) 10 MG tablet Take 1 tablet (10 mg total) by mouth  daily. 90 tablet 1   gabapentin  (NEURONTIN ) 300 MG capsule Take 1 capsule (300 mg total) by mouth at bedtime. (Patient not taking: Reported on 11/21/2023) 30 capsule 11   Current Facility-Administered Medications on File Prior to Visit  Medication Dose Route Frequency Provider Last Rate Last Admin   IncobotulinumtoxinA  SOLR 100 Units  100 Units Intramuscular Once Kirsteins, Cecilia Coe, MD        Allergies:  No Known Allergies   Physical Exam  Vitals:   11/21/23 1540  BP: 118/65  Pulse: (!) 58  Weight: 170 lb (77.1 kg)  Height: 5\' 5"  (1.651 m)   Body mass index is 28.29 kg/m. No  results found.  General: well developed, well nourished, pleasant middle-aged male, seated, in no evident distress Head: head normocephalic and atraumatic.   Neck: supple with no carotid or supraclavicular bruits Cardiovascular: regular rate and rhythm, no murmurs Musculoskeletal: limited bilateral shoulder ROM Skin:  no rash/petichiae Vascular:  Normal pulses all extremities   Neurologic Exam Mental Status: Awake and fully alert. Mild dysarthria with hypophonia. Oriented to place and time. Recent and remote memory intact. Attention span, concentration and fund of knowledge appropriate. Mood and affect appropriate.  Cranial Nerves: Pupils equal, briskly reactive to light. Extraocular movements full without nystagmus. Visual fields full to confrontation. Hearing intact. Facial sensation intact. Mild right lower facial weakness when smiling.  tongue, palate moves normally and symmetrically.  Motor: Spastic right hemiparesis with 4/5 strength with weakness of right grip intrinsic hand muscles and mild weakness of right hip flexors and ankle dorsiflexors.  Tone is increased on the right compared to the left.  Normal strength on the left. Sensory.: intact to touch , pinprick , position and vibratory sensation.  Coordination: Rapid alternating movements slowed in all 4 extremities. Finger-to-nose and heel-to-shin performed accurately on left side.  Unable to adequately assess right side due to increased tone Gait and Station: Arises from chair with mild difficulty d/t pain. Stance is normal. Gait demonstrates  slow hemiplegic gait and stiffened right leg with use of cane.  Tandem walking heel toe not attempted Reflexes: 2+ RUE and [name] and 1+ left side. Toes downgoing.        ASSESSMENT/PLAN: Edward Glass is a 68 y.o. year old male here with left MCA patchy infarct secondary to left M1 occlusion with embolic pattern due to unclear etiology on 08/30/2018 and neurological worsening with extension of  deficits on 09/01/2018.  ILR reached end of service 06/2022 s/p removal. No evidence of a fib during monitoring duration. Vascular risk factors include intracranial stenosis, HLD and HTN.   He has chronic daily headaches which sound like transformed tension headaches with analgesic rebound.  He also has increased spasticity and stiffness in the right side.   I had a long d/w patient and his girlfriend about his remote stroke and spastic right hemiparesis, tension headaches, risk for recurrent stroke/TIAs, personally independently reviewed imaging studies and stroke evaluation results and answered questions.Continue Plavix  75 mg daily for secondary stroke prevention and maintain strict control of hypertension with blood pressure goal below 130/90, diabetes with hemoglobin A1c goal below 6.5% and lipids with LDL cholesterol goal below 70 mg/dL. I also advised the patient to eat a healthy diet with plenty of whole grains, cereals, fruits and vegetables, exercise regularly and maintain ideal body weight .I recommend he taper his Zanaflex  to 1 tablet daily for 1 week and discontinue and instead start baclofen  5 mg twice daily for a week increase  to 10 mg twice daily and then if tolerated to 10 mg 3 times daily to help with his spasticity.  I also encouraged him to discontinue Tylenol  and over-the-counter analgesics for his tension headaches and instead do regular neck stretching exercises and participate in regular activities for stress relaxation like meditation, yoga and exercises.  Followup in the future with my nurse practitioner in 6 months or call earlier if necessary.  I spent 40 minutes of face-to-face and non-face-to-face time with patient.  This included previsit chart review, lab review, study review, order entry, electronic health record documentation, patient education and discussion regarding above diagnoses and treatment plan and answered all the questions to patient satisfaction Ardella Beaver,  MD  [facility] [address] [place], [place] [address]  Phone [phone] Fax [phone] Note: This document was prepared with digital dictation and possible smart phrase technology. Any transcriptional errors that result from this process are unintentional.

## 2023-12-04 ENCOUNTER — Ambulatory Visit
Admission: RE | Admit: 2023-12-04 | Discharge: 2023-12-04 | Disposition: A | Discharge status: Home / Self Care | Source: Ambulatory Visit | Attending: Urology | Admitting: Urology

## 2023-12-04 DIAGNOSIS — R972 Elevated prostate specific antigen [PSA]: Secondary | ICD-10-CM

## 2023-12-04 MED ORDER — GADOPICLENOL 0.5 MMOL/ML IV SOLN
8.0000 mL | Freq: Once | INTRAVENOUS | Status: AC | PRN
  Administered 2023-12-04: 8 mL via INTRAVENOUS

## 2023-12-07 ENCOUNTER — Other Ambulatory Visit: Payer: Self-pay | Admitting: Neurology

## 2023-12-08 ENCOUNTER — Ambulatory Visit: Admitting: Orthopedic Surgery

## 2023-12-08 DIAGNOSIS — M7061 Trochanteric bursitis, right hip: Secondary | ICD-10-CM

## 2023-12-08 NOTE — Progress Notes (Signed)
 Orthopedic Surgery Office Note  Patient comes back in today with right lateral hip pain.  He said he got about 6 months relief with the last injection.  The pain is in the same spot as before.  He notices it mostly with walking.  He does not lay on that side as he does not notice it then.  On exam, he is tender to palpation over the lateral aspect of hip in the area of the trochanteric bursa.  He was interested in repeat injection.  This was done today in the office - see procedure note below.  He can return to the office on an as-needed basis.   Right hip trochanteric bursa injection note: After discussing the risk, benefits, alternatives of right hip trochanteric bursa injection, patient elected to proceed.  The patient was in the lateral decubitus position with the right hip up.  The skin over the greater trochanter was prepped with alcohol based prep.  Ethyl chloride was used to anesthetize the skin.  A 20-gauge needle was used to inject 1 cc of lidocaine , 1 cc of bupivacaine , 1 cc of Depo-Medrol  under standard sterile technique.  Needle was taken out and band aid was applied. Patient tolerated the procedure well.    Diedra Fowler, MD Orthopedic Surgeon

## 2023-12-10 ENCOUNTER — Other Ambulatory Visit: Payer: Self-pay | Admitting: Adult Health

## 2023-12-13 DIAGNOSIS — E785 Hyperlipidemia, unspecified: Secondary | ICD-10-CM | POA: Insufficient documentation

## 2024-01-17 ENCOUNTER — Encounter: Admitting: Physical Medicine & Rehabilitation

## 2024-02-22 ENCOUNTER — Telehealth: Payer: Self-pay | Admitting: Internal Medicine

## 2024-02-22 NOTE — Telephone Encounter (Signed)
 Spoke with pt regarding his pulse. Pt stated his home health nurse came over and took his pulse which was 65 and told him he needed to contact his cardiologist. Pt did not have any other heart rates to share. His blood pressure was 114/70 yesterday. Pt stated he has been experiencing headaches and shortness of breath with exertion for a couple months. Pt stated he rides the bike at the gym for 45 minutes but that it is becoming harder for him. Pt stated he has been dealing with fatigue as well. Pt denied chest pain or shortness of breath at the time of the call. Pt was told to keep a blood pressure log including his heart rates. Pt was told that the information provided would be forwarded to Dr. Waddell and his nurse for their review. Pt verbalized understanding and all questions if any were answered.

## 2024-02-22 NOTE — Telephone Encounter (Signed)
 STAT if HR is under 50 or over 120 (normal HR is 60-100 beats per minute)  What is your heart rate? 57 yesterday. Home Health Nurse came by and told him to contact his cardiology office. Appt was made by [facility] for 08/13 with Charlies Arthur, PA.   Do you have a log of your heart rate readings (document readings)? no  Do you have any other symptoms? Headaches and some SOB  Pt c/o Shortness Of Breath: STAT if SOB developed within the last 24 hours or pt is noticeably SOB on the phone  1. Are you currently SOB (can you hear that pt is SOB on the phone)? no  2. How long have you been experiencing SOB? Probably about 2 months   3. Are you SOB when sitting or when up moving around? Up moving around, notices when he is exerting himself  4. Are you currently experiencing any other symptoms? Headaches, denies chest pain.

## 2024-02-26 NOTE — Telephone Encounter (Signed)
 A heart rate of 57 is normal. No additional heart rate evaluation is indicated.

## 2024-02-27 NOTE — Telephone Encounter (Signed)
  Signed     A heart rate of 57 is normal. No additional heart rate evaluation is indicated.         Dr Waddell  Left message with the information above (dpr).  Left call back number

## 2024-03-05 NOTE — Progress Notes (Addendum)
  Cardiology Office Note:  .   Date:  03/05/2024  ID:  Edward Glass, DOB [birth year], MRN [id number] PCP: Rena Luke POUR, MD  [facility] Providers Cardiologist:  None Electrophysiologist:  Danelle Birmingham, MD {  History of Present Illness: .   Edward Glass is a 68 y.o. male w/PMHx of  HTN, HLD Cryptogenic stroke > ILR  He saw Dr. Birmingham last Dec 2023 > at that time his ILR had reached EOS, no AFib had been detected and was removed  Pat reached out recently with [facility] reporting slow HRs (57) and advised that he see his cardiologist Dr. Birmingham reported no w/u indicated for HR 57 Though still given an appt   Today's visit is scheduled to evaluate bradycardia ROS:   He reports in the last 2 mo or so, feeling a bit more tired, less energetic, perhaps lethargic Though still going to the gym, able to get his usual work-out done, but has to push through it Unusually tired No CP, palpitations or cardiac awareness No SOB, DOE No dizzy spells, near syncope or syncope   Studies Reviewed: SABRA    EKG done today and reviewed by myself:  SR 67bpm  08/17/21: TTE  1. Left ventricular ejection fraction, by estimation, is 60 to 65%. The  left ventricle has normal function. The left ventricle has no regional  wall motion abnormalities. Left ventricular diastolic parameters were  normal.   2. Right ventricular systolic function is normal. The right ventricular  size is normal.   3. Trivial mitral valve regurgitation.   4. The aortic valve is tricuspid. Aortic valve regurgitation is not  visualized. Aortic valve sclerosis is present, with no evidence of aortic  valve stenosis.   5. Aortic dilatation noted. There is borderline dilatation of the  ascending aorta, measuring 38 mm.   Risk Assessment/Calculations:    Physical Exam:   VS:  There were no vitals taken for this visit.   Wt Readings from Last 3 Encounters:  11/21/23 170 lb (77.1 kg)  09/08/23 173 lb 3.2 oz (78.6 kg)   09/02/23 170 lb (77.1 kg)    GEN: Well nourished, well developed in no acute distress NECK: No JVD; No carotid bruits CARDIAC: RRR, no murmurs, rubs, gallops RESPIRATORY:   CTA b/l without rales, wheezing or rhonchi  ABDOMEN: Soft, non-tender, non-distended EXTREMITIES:   No edema; No deformity   R sided deficit post stroke remain  ASSESSMENT AND PLAN: .     bradycardia Not clearly symptomatic, though reports a couple months of unusual fatigue Will go ahead and get a 3 day monitor Though doubt HR/rhythm related  HTN ~ 110s at home Follow and deferred to his PMD   Dispo: back with EP PRN, pending his monitor findings  Signed, Charlies Macario Arthur, PA-C

## 2024-03-07 ENCOUNTER — Encounter: Payer: Self-pay | Admitting: Physician Assistant

## 2024-03-07 ENCOUNTER — Ambulatory Visit: Attending: Physician Assistant | Admitting: Physician Assistant

## 2024-03-07 ENCOUNTER — Ambulatory Visit

## 2024-03-07 VITALS — BP 104/62 | HR 67 | Ht 64.0 in | Wt 172.8 lb

## 2024-03-07 DIAGNOSIS — I10 Essential (primary) hypertension: Secondary | ICD-10-CM

## 2024-03-07 DIAGNOSIS — R001 Bradycardia, unspecified: Secondary | ICD-10-CM

## 2024-03-07 DIAGNOSIS — I456 Pre-excitation syndrome: Secondary | ICD-10-CM | POA: Diagnosis not present

## 2024-03-07 NOTE — Progress Notes (Unsigned)
 Enrolled patient for a 3 day Zio XT monitor to be mailed to patients home   Edward Glass to read

## 2024-03-07 NOTE — Patient Instructions (Signed)
 Medication Instructions:   Your physician recommends that you continue on your current medications as directed. Please refer to the Current Medication list given to you today.   *If you need a refill on your cardiac medications before your next appointment, please call your pharmacy*    Lab Work: NONE ORDERED  TODAY     If you have labs (blood work) drawn today and your tests are completely normal, you will receive your results only by: MyChart Message (if you have MyChart) OR A paper copy in the mail If you have any lab test that is abnormal or we need to change your treatment, we will call you to review the results.    Testing/Procedures: Your physician has recommended that you wear an event monitor. Event monitors are medical devices that record the heart's electrical activity. Doctors most often us  these monitors to diagnose arrhythmias. Arrhythmias are problems with the speed or rhythm of the heartbeat. The monitor is a small, portable device. You can wear one while you do your normal daily activities. This is usually used to diagnose what is causing palpitations/syncope (passing out).      Follow-Up: At [facility], you and your health needs are our priority.  As part of our continuing mission to provide you with exceptional heart care, our providers are all part of one team.  This team includes your primary Cardiologist (physician) and Advanced Practice Providers or APPs (Physician Assistants and Nurse Practitioners) who all work together to provide you with the care you need, when you need it.    Your next appointment:  BASED UPON  MONITOR RESULTS..CONTACT  [facility] [phone] AS NEEDED FOR  ANY CARDIAC RELATED SYMPTOMS  ZIO XT- Long Term Monitor Instructions  Your physician has requested you wear a ZIO patch monitor for 3 days.  This is a single patch monitor. [employer] supplies one patch monitor per enrollment. Additional stickers are not  available. Please do not apply patch if you will be having a Nuclear Stress Test,  Echocardiogram, Cardiac CT, MRI, or Chest Xray during the period you would be wearing the  monitor. The patch cannot be worn during these tests. You cannot remove and re-apply the  ZIO XT patch monitor.  Your ZIO patch monitor will be mailed 3 day USPS to your address on file. It may take 3-5 days  to receive your monitor after you have been enrolled.  Once you have received your monitor, please review the enclosed instructions. Your monitor  has already been registered assigning a specific monitor serial # to you.  Billing and Patient Assistance Program Information  We have supplied [employer] with any of your insurance information on file for billing purposes. [employer] offers a sliding scale Patient Assistance Program for patients that do not have  insurance, or whose insurance does not completely cover the cost of the ZIO monitor.  You must apply for the Patient Assistance Program to qualify for this discounted rate.  To apply, please call [employer] at [phone], select option 4, select option 2, ask to apply for  Patient Assistance Program. Meredeth will ask your household income, and how many people  are in your household. They will quote your out-of-pocket cost based on that information.  [employer] will also be able to set up a 31-month, interest-free payment plan if needed.  Applying the monitor   Shave hair from upper left chest.  Hold abrader disc by orange tab. Rub abrader in 40 strokes over the  upper left chest as  indicated in your monitor instructions.  Clean area with 4 enclosed alcohol pads. Let dry.  Apply patch as indicated in monitor instructions. Patch will be placed under collarbone on left  side of chest with arrow pointing upward.  Rub patch adhesive wings for 2 minutes. Remove white label marked 1. Remove the white  label marked 2. Rub patch adhesive wings for 2 additional minutes.   While looking in a mirror, press and release button in center of patch. A small green light will  flash 3-4 times. This will be your only indicator that the monitor has been turned on.  Do not shower for the first 24 hours. You may shower after the first 24 hours.  Press the button if you feel a symptom. You will hear a small click. Record Date, Time and  Symptom in the Patient Logbook.  When you are ready to remove the patch, follow instructions on the last 2 pages of Patient  Logbook. Stick patch monitor onto the last page of Patient Logbook.  Place Patient Logbook in the blue and white box. Use locking tab on box and tape box closed  securely. The blue and white box has prepaid postage on it. Please place it in the mailbox as  soon as possible. Your physician should have your test results approximately 7 days after the  monitor has been mailed back to [facility].  Call [facility] Customer Care at [phone] if you have questions regarding  your ZIO XT patch monitor. Call them immediately if you see an orange light blinking on your  monitor.  If your monitor falls off in less than 4 days, contact our Monitor department at [phone].  If your monitor becomes loose or falls off after 4 days call [employer] at [phone] for  suggestions on securing your monitor   We recommend signing up for the patient portal called MyChart.  Sign up information is provided on this After Visit Summary.  MyChart is used to connect with patients for Virtual Visits (Telemedicine).  Patients are able to view lab/test results, encounter notes, upcoming appointments, etc.  Non-urgent messages can be sent to your provider as well.   To learn more about what you can do with MyChart, go to [email or web address].   Other Instructions

## 2024-03-21 ENCOUNTER — Ambulatory Visit: Payer: Self-pay | Admitting: Physician Assistant

## 2024-03-21 DIAGNOSIS — R001 Bradycardia, unspecified: Secondary | ICD-10-CM

## 2024-04-16 ENCOUNTER — Other Ambulatory Visit: Payer: Self-pay

## 2024-04-16 ENCOUNTER — Emergency Department (HOSPITAL_COMMUNITY)

## 2024-04-16 ENCOUNTER — Encounter (HOSPITAL_COMMUNITY): Payer: Self-pay

## 2024-04-16 ENCOUNTER — Emergency Department (HOSPITAL_COMMUNITY)
Admission: EM | Admit: 2024-04-16 | Discharge: 2024-04-16 | Disposition: A | Discharge status: Home / Self Care | Attending: Emergency Medicine | Admitting: Emergency Medicine

## 2024-04-16 DIAGNOSIS — I10 Essential (primary) hypertension: Secondary | ICD-10-CM | POA: Insufficient documentation

## 2024-04-16 DIAGNOSIS — R5383 Other fatigue: Secondary | ICD-10-CM | POA: Insufficient documentation

## 2024-04-16 DIAGNOSIS — E785 Hyperlipidemia, unspecified: Secondary | ICD-10-CM | POA: Insufficient documentation

## 2024-04-16 DIAGNOSIS — R519 Headache, unspecified: Secondary | ICD-10-CM | POA: Diagnosis present

## 2024-04-16 DIAGNOSIS — N400 Enlarged prostate without lower urinary tract symptoms: Secondary | ICD-10-CM | POA: Insufficient documentation

## 2024-04-16 DIAGNOSIS — Z7901 Long term (current) use of anticoagulants: Secondary | ICD-10-CM | POA: Diagnosis not present

## 2024-04-16 DIAGNOSIS — I69351 Hemiplegia and hemiparesis following cerebral infarction affecting right dominant side: Secondary | ICD-10-CM | POA: Diagnosis not present

## 2024-04-16 DIAGNOSIS — R079 Chest pain, unspecified: Secondary | ICD-10-CM | POA: Diagnosis not present

## 2024-04-16 DIAGNOSIS — I440 Atrioventricular block, first degree: Secondary | ICD-10-CM | POA: Insufficient documentation

## 2024-04-16 DIAGNOSIS — Z79899 Other long term (current) drug therapy: Secondary | ICD-10-CM | POA: Insufficient documentation

## 2024-04-16 DIAGNOSIS — R531 Weakness: Secondary | ICD-10-CM | POA: Insufficient documentation

## 2024-04-16 DIAGNOSIS — I6782 Cerebral ischemia: Secondary | ICD-10-CM | POA: Diagnosis not present

## 2024-04-16 LAB — URINALYSIS, ROUTINE W REFLEX MICROSCOPIC
Bilirubin Urine: NEGATIVE
Glucose, UA: NEGATIVE mg/dL
Hgb urine dipstick: NEGATIVE
Ketones, ur: NEGATIVE mg/dL
Leukocytes,Ua: NEGATIVE
Nitrite: NEGATIVE
Protein, ur: NEGATIVE mg/dL
Specific Gravity, Urine: 1.018 (ref 1.005–1.030)
pH: 6 (ref 5.0–8.0)

## 2024-04-16 LAB — CBC WITH DIFFERENTIAL/PLATELET
Abs Immature Granulocytes: 0.01 K/uL (ref 0.00–0.07)
Basophils Absolute: 0.1 K/uL (ref 0.0–0.1)
Basophils Relative: 1 %
Eosinophils Absolute: 0.1 K/uL (ref 0.0–0.5)
Eosinophils Relative: 1 %
HCT: 49.9 % (ref 39.0–52.0)
Hemoglobin: 16.2 g/dL (ref 13.0–17.0)
Immature Granulocytes: 0 %
Lymphocytes Relative: 35 %
Lymphs Abs: 2.2 K/uL (ref 0.7–4.0)
MCH: 30.3 pg (ref 26.0–34.0)
MCHC: 32.5 g/dL (ref 30.0–36.0)
MCV: 93.3 fL (ref 80.0–100.0)
Monocytes Absolute: 0.5 K/uL (ref 0.1–1.0)
Monocytes Relative: 8 %
Neutro Abs: 3.5 K/uL (ref 1.7–7.7)
Neutrophils Relative %: 55 %
Platelets: 277 K/uL (ref 150–400)
RBC: 5.35 MIL/uL (ref 4.22–5.81)
RDW: 11.9 % (ref 11.5–15.5)
WBC: 6.4 K/uL (ref 4.0–10.5)
nRBC: 0 % (ref 0.0–0.2)

## 2024-04-16 LAB — PRO BRAIN NATRIURETIC PEPTIDE: Pro Brain Natriuretic Peptide: <50 pg/mL (ref <300.0)

## 2024-04-16 LAB — MAGNESIUM: Magnesium: 2.1 mg/dL (ref 1.7–2.4)

## 2024-04-16 LAB — COMPREHENSIVE METABOLIC PANEL WITH GFR
ALT: 30 U/L (ref 0–44)
AST: 38 U/L (ref 15–41)
Albumin: 4.2 g/dL (ref 3.5–5.0)
Alkaline Phosphatase: 69 U/L (ref 38–126)
Anion gap: 12 (ref 5–15)
BUN: 23 mg/dL (ref 8–23)
CO2: 24 mmol/L (ref 22–32)
Calcium: 9.6 mg/dL (ref 8.9–10.3)
Chloride: 102 mmol/L (ref 98–111)
Creatinine, Ser: 0.71 mg/dL (ref 0.61–1.24)
GFR, Estimated: >60 mL/min (ref >60)
Glucose, Bld: 84 mg/dL (ref 70–99)
Potassium: 4.2 mmol/L (ref 3.5–5.1)
Sodium: 138 mmol/L (ref 135–145)
Total Bilirubin: 0.3 mg/dL (ref 0.0–1.2)
Total Protein: 7 g/dL (ref 6.5–8.1)

## 2024-04-16 LAB — TROPONIN T, HIGH SENSITIVITY: Troponin T High Sensitivity: <15 ng/L (ref 0–19)

## 2024-04-16 LAB — CK: Total CK: 159 U/L (ref 49–397)

## 2024-04-16 LAB — FOLATE: Folate: 15.6 ng/mL (ref >5.9)

## 2024-04-16 LAB — RESP PANEL BY RT-PCR (RSV, FLU A&B, COVID)  RVPGX2
Influenza A by PCR: NEGATIVE
Influenza B by PCR: NEGATIVE
Resp Syncytial Virus by PCR: NEGATIVE
SARS Coronavirus 2 by RT PCR: NEGATIVE

## 2024-04-16 LAB — TSH: TSH: 3.39 u[IU]/mL (ref 0.350–4.500)

## 2024-04-16 LAB — VITAMIN B12: Vitamin B-12: 1322 pg/mL — ABNORMAL HIGH (ref 180–914)

## 2024-04-16 LAB — CBG MONITORING, ED: Glucose-Capillary: 88 mg/dL (ref 70–99)

## 2024-04-16 MED ORDER — GADOBUTROL 1 MMOL/ML IV SOLN
8.0000 mL | Freq: Once | INTRAVENOUS | Status: AC | PRN
  Administered 2024-04-16: 8 mL via INTRAVENOUS

## 2024-04-16 MED ORDER — BACLOFEN 10 MG PO TABS
10.0000 mg | ORAL_TABLET | Freq: Three times a day (TID) | ORAL | Status: DC
  Administered 2024-04-16: 10 mg via ORAL
  Filled 2024-04-16: qty 1

## 2024-04-16 NOTE — ED Provider Notes (Signed)
 [place] EMERGENCY DEPARTMENT AT [facility] Provider Note   CSN: [id number] Arrival date & time: 04/16/24  1203     Patient presents with: Weakness, Chest Pain, and Headache   Edward Glass is a 68 y.o. male.    Weakness Associated symptoms: headaches   Chest Pain Associated symptoms: fatigue, headache and weakness   Headache Associated symptoms: fatigue and weakness   Patient presents for multiple complaints.  Medical history includes HTN, HLD, arthritis, BPH, CVA.  His CVA was 5 years ago.  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  At baseline, he ambulates with a cane.  He does continue to work as a Pharmacist, hospital.  While at work, he utilizes a walker with a chair on it.  Over the past several months, he feels like his right-sided weakness has been worsening.  He also feels stiff throughout his body.  The stiffness seems to worsen throughout the day.  Over the past several days, this has continued to worsen.  This morning, he went to the gym.  When he attempted to ride stationary bike, he was unable to.  Patient also reports intermittent right-sided headache.  He denies any headache currently.  He does take Plavix  but no other antiplatelet or anticoagulant medications.     Prior to Admission medications   Medication Sig Start Date End Date Taking? Authorizing Provider  acetaminophen  (TYLENOL ) 325 MG tablet Take 2 tablets (650 mg total) by mouth every 4 (four) hours as needed for mild pain (or temp > 37.5 C (99.5 F)). 09/29/18   Angiulli, Toribio PARAS, PA-C  albuterol (VENTOLIN HFA) 108 (90 Base) MCG/ACT inhaler as needed for shortness of breath. Patient not taking: Reported on 03/07/2024 06/08/21   [provider]  amLODipine  (NORVASC ) 5 MG tablet Take 5 mg by mouth daily. 03/25/20   [provider]  baclofen  (LIORESAL ) 10 MG tablet Take 1 tablet (10 mg total) by mouth 3 (three) times daily. 12/09/23   Sethi, Pramod S, MD  clopidogrel   (PLAVIX ) 75 MG tablet Take 1 tablet by mouth once daily 12/12/23   Sethi, Pramod S, MD  diclofenac  Sodium (VOLTAREN ) 1 % GEL Apply topically in the morning and at bedtime. 03/27/19   [provider]  ezetimibe  (ZETIA ) 10 MG tablet Take 1 tablet (10 mg total) by mouth daily. 04/19/23   Whitfield Raisin, NP  FLUoxetine  (PROZAC ) 20 MG capsule Take by mouth. 12/30/21   [provider]  gabapentin  (NEURONTIN ) 300 MG capsule Take 1 capsule (300 mg total) by mouth at bedtime. Patient not taking: Reported on 03/07/2024 09/08/23   Whitfield Raisin, NP  Multiple Vitamin (MULTIVITAMIN PO) Take by mouth daily.    [provider]  pravastatin  (PRAVACHOL ) 20 MG tablet Take 1 tablet (20 mg total) by mouth daily. 11/15/23   Whitfield Raisin, NP    Allergies: Patient has no known allergies.    Review of Systems  Constitutional:  Positive for fatigue.  Neurological:  Positive for weakness and headaches.  All other systems reviewed and are negative.   Updated Vital Signs BP 128/76 (BP Location: Left Arm)   Pulse 60   Temp 98.7 F (37.1 C) (Oral)   Resp 16   SpO2 99%   Physical Exam Vitals and nursing note reviewed.  Constitutional:      General: He is not in acute distress.    Appearance: He is well-developed. He is not ill-appearing, toxic-appearing or diaphoretic.  HENT:  Head: Normocephalic and atraumatic.  Eyes:     Conjunctiva/sclera: Conjunctivae normal.  Cardiovascular:     Rate and Rhythm: Normal rate and regular rhythm.  Pulmonary:     Effort: Pulmonary effort is normal. No tachypnea or respiratory distress.  Chest:     Chest wall: No tenderness.  Abdominal:     Palpations: Abdomen is soft.     Tenderness: There is no abdominal tenderness.  Musculoskeletal:        General: No swelling.     Cervical back: Normal range of motion and neck supple.  Skin:    General: Skin is warm and dry.     Coloration: Skin is not cyanotic or pale.  Neurological:     Mental  Status: He is alert and oriented to person, place, and time.     Sensory: Sensation is intact.     Motor: Weakness present.     Comments: Speech is slightly slowed.  He has weakness and right upper and lower extremity.  He feels that this is worsened from baseline.  He does seem to have good strength in left hemibody.  He denies any areas of diminished sensation  Psychiatric:        Mood and Affect: Mood normal.        Behavior: Behavior normal.     (all labs ordered are listed, but only abnormal results are displayed) Labs Reviewed  RESP PANEL BY RT-PCR (RSV, FLU A&B, COVID)  [id number]  CBC WITH DIFFERENTIAL/PLATELET  COMPREHENSIVE METABOLIC PANEL WITH GFR  URINALYSIS, ROUTINE W REFLEX MICROSCOPIC  PRO BRAIN NATRIURETIC PEPTIDE  MAGNESIUM  CK  CBG MONITORING, ED  TROPONIN T, HIGH SENSITIVITY  TROPONIN T, HIGH SENSITIVITY    EKG: EKG Interpretation Date/Time:  Monday April 16 2024 12:11:32 EDT Ventricular Rate:  61 PR Interval:  231 QRS Duration:  93 QT Interval:  414 QTC Calculation: 417 R Axis:   69  Text Interpretation: Sinus rhythm Prolonged PR interval Consider right atrial enlargement Confirmed by Melvenia Motto [phone] on 04/16/2024 2:28:00 PM  Radiology: No results found.   Procedures   Medications Ordered in the ED  baclofen  (LIORESAL ) tablet 10 mg (has no administration in time range)                                    Medical Decision Making Amount and/or Complexity of Data Reviewed Labs: ordered. Radiology: ordered.  Risk Prescription drug management.   This patient presents to the ED for concern of weakness and fatigue, this involves an extensive number of treatment options, and is a complaint that carries with it a high risk of complications and morbidity.  The differential diagnosis includes progression of post-CVA spasticity, new CVA, infection, metabolic derangements   Co morbidities / Chronic conditions that complicate the patient  evaluation  HTN, HLD, arthritis, BPH, CVA   Additional history obtained:  Additional history obtained from EMR External records from outside source obtained and reviewed including patient's significant other   Lab Tests:  I Ordered, and personally interpreted labs.  The pertinent results include: Normal hemoglobin, no leukocytosis, normal kidney function, normal electrolytes, no evidence of UTI   Imaging Studies ordered:  I ordered imaging studies including CT head, MRI brain I independently visualized and interpreted imaging which showed (pending at time of signout) I agree with the radiologist interpretation   Cardiac Monitoring: / EKG:  The patient was maintained on a  cardiac monitor.  I personally viewed and interpreted the cardiac monitored which showed an underlying rhythm of: Sinus rhythm   Problem List / ED Course / Critical interventions / Medication management  Patient presenting for acute on chronic right hemibody weakness, full body stiffness and fatigue, intermittent right-sided headaches.  On arrival in the ED, vital signs are normal.  Patient denies any current headache or any other areas of discomfort.  On exam, he does have right hemibody weakness.  Although this is a baseline finding for him, he feels like his weakness is acute on chronic.  He has good strength in his left hemibody.  He has a swelling in area of right bicep.  Patient and his significant other report that this is normal for him.  This swelling as well as his generalized stiffness does seem to worsen throughout the day and is more noticeable at nighttime.  Per chart review, he is followed by Dr. Rosemarie.  He was last seen in the office in April.  At the time, he did endorse similar complaints as today.  He was trialed on gabapentin  which made him feel worse.  Baclofen  seem to help more than Zanaflex  for his stiffness.  Home dose of baclofen  was ordered.  Workup was initiated.  Lab work was unremarkable.   Imaging studies pending at time of signout.  Care of patient signed out to oncoming ED provider. I ordered medication including baclofen  for stiffness Reevaluation of the patient after these medicines showed that the patient stayed the same I have reviewed the patients home medicines and have made adjustments as needed  Social Determinants of Health:  Lives independently     Final diagnoses:  Generalized weakness    ED Discharge Orders     None          Melvenia Motto, MD 04/16/24 1600

## 2024-04-16 NOTE — ED Triage Notes (Signed)
 Pt came in for chest tightness, headache, and weakness that's been on and off. However, he was trying to workout today and became weaker.

## 2024-04-16 NOTE — ED Provider Notes (Signed)
 I discussed the patient with Dr. Vanessa neurology and he recommended sending a B12 folate and TSH and having the patient follow-up with his neurologist for nerve conduction studies.   Suzette Pac, MD 04/16/24 2151

## 2024-04-16 NOTE — ED Notes (Addendum)
 Patient vitals will be updated once returned from MRI

## 2024-04-16 NOTE — ED Notes (Signed)
 Patient taken to MRI at this time

## 2024-04-16 NOTE — ED Provider Triage Note (Signed)
 Emergency Medicine Provider Triage Evaluation Note  Edward Glass , a 68 y.o. male  was evaluated in triage.  Pt complains of headache, rhinorrhea, congestion, body aches, lightheadedness since Saturday. Denies fever, cough, nausea, vomiting, diarrhea.  Review of Systems  Positive:  Negative:   Physical Exam  BP 128/76 (BP Location: Left Arm)   Pulse 60   Temp 98.7 F (37.1 C) (Oral)   Resp 16   SpO2 99%  Gen:   Awake, no distress   Resp:  Normal effort  MSK:   Moves extremities without difficulty  Other:  GCS 15. Speech is goal oriented. No deficits appreciated to CN III-XII; symmetric eyebrow raise, no facial drooping, tongue midline. Patient has equal grip strength bilaterally with 5/5 strength against resistance in all major muscle groups bilaterally. Sensation to light touch intact. Patient moves extremities without ataxia.  Medical Decision Making  Medically screening exam initiated at 12:14 PM.  Appropriate orders placed.  Edward Glass was informed that the remainder of the evaluation will be completed by another provider, this initial triage assessment does not replace that evaluation, and the importance of remaining in the ED until their evaluation is complete.    Hoy Nidia FALCON, [place] 04/16/24 1215

## 2024-04-16 NOTE — Discharge Instructions (Signed)
 Continue taking your multivitamin and follow-up with your neurologist in the next 2 to 4 weeks

## 2024-04-17 ENCOUNTER — Telehealth: Payer: Self-pay | Admitting: Adult Health

## 2024-04-17 NOTE — Telephone Encounter (Signed)
 Noted! Thank you

## 2024-04-17 NOTE — Telephone Encounter (Signed)
 Returned call to pt and scheduled hospital followup as requested to discuss weakness ppt voiced gratitude and understanding. Pt is requesting a ncs emg be ordered during appt. Routing to NP to make aware incase she needs to request changes

## 2024-04-17 NOTE — Telephone Encounter (Signed)
 Pt called to request to speak to Nurse Pt was just release from Hospital and ED Doctor informed PT TO  FOLLOW UP WITH neurologist to get a Nerve conduction test  completed

## 2024-04-24 NOTE — Progress Notes (Unsigned)
 [facility] [address] [place]. [place] [address] [phone]       STROKE FOLLOW UP NOTE  [name] Date of Birth:  1955/12/05 Medical Record Number:  [id number]   Reason for visit: Upper and lower extremity paresthesias, weakness and pain [facility] provider: Dr. Rosemarie PCP: Edward Luke POUR, MD     CHIEF COMPLAINT:  No chief complaint on file.    HPI:  Update 04/25/2024 JM: Patient returns for sooner scheduled visit for recent hospital follow-up.  He was previously seen by Dr. Rosemarie 4 months ago and was switched back to baclofen  from tizanidine  and was again advised to limit Tylenol  use due to suspected rebound headache.  He was seen at [facility] ED on 9/22 with complaints of worsening right sided weakness, stiffness and intermittent right-sided headaches.  CT head and MRI brain without acute abnormality.  Lab work largely unremarkable. Dr. Khaliqdina recommended following up with neurology for completion of nerve conduction study.         History provided for reference purposes only Update 11/21/2023 Dr. Rosemarie: He returns for follow-up after last visit with Edward nurse practitioner 2 and half months ago.  Patient had complained of significant paresthesias in extremities and increased weakness.  She had prescribed gabapentin  but the patient discontinued it saying that it made him feel worse.  He complains of increased stiffness on his left side as well as cramps in both legs.  He cannot stand up on his feet for too long.  He is currently on Zanaflex  2 mg twice daily but feels that he was doing better on baclofen  and wants to go back on it.  He has not had any recurrent stroke or TIA symptoms.  He is tolerating Plavix  well without bruising or bleeding.  His blood pressure is under good control today it is 118/65.  He is tolerating Pravachol  well without side effects.  Lab work on 09/08/2023 showed LDL-cholesterol to be borderline at 71 mg percent.  Vitamin  B1, B6, B12, folate were normal.  TSH and ESR were normal.  Rheumatoid factor and ANA were also negative.  Hemoglobin A1c on 09/15/2023 was 6.0.  MRI scan of the brain on 09/02/2023 showed no acute abnormality.   Update 09/08/2023 JM: Patient returns for sooner scheduled visit due to complaints of intermittent bilateral upper and lower extremity paresthesias.  Onset about 4 weeks ago, was seen in the ED 2/7 for these complaints, MRI brain negative for acute stroke and lab work largely unremarkable without significant electrolyte derangement, UA without evidence of infection.  He was advised to follow-up outpatient.  He reports continued upper and lower extremity numbness which is constant, worsens with ambulation, feels like upper extremity numbness gradually worsening. Symptoms from fingertips to shoulder and toes to hips. He also complains of increased right or left sided headaches for the past 3 weeks, occur daily, usually last 15-60 minutes. Has been taking ibuprofen twice daily, previously using ibuprofen at least once daily for a long time.  He does have history of headaches since his stroke.  Headaches not associated with photophobia, phonophobia or N/V. He also feels increased weakness in lower extremities and worsening right sided spasticity. He complains of generalized pain.  He continues to try to stay active but this has been more difficult recently.  He has had greater difficulty ambulating due to lower extremity weakness and pain, continues to use pain.  He does have occasional low back pain but does not necessarily feel  pain radiating down his legs.  Denies neck pain. Denies any change to medications or lifestyle at onset, denies any traumatic event.  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  Reports compliance on aspirin  and pravastatin .   Update 04/06/2023 JM: Patient returns for follow-up visit.  At prior visit, started on Topamax  for likely tension headaches as  well as increased baclofen  dosage for increased right sided spasticity.  During the interval time, baclofen  switched to tizanidine  as no benefit with higher baclofen  dosage and discontinued topiramate  due to side effects. He continues to complain of worsening right sided stiffness, PCP recently reduced Crestor  dosage to half pill as he believes statin is making stiffness worse, did not some improvement with lowering dose currently on 5mg  daily, he questions other form of HLD treatment. Currently taking tizanidine  2mg  twice daily, noted increased spasticity in the morning when only taking at night, tolerating well but can cause some fatigue.  Continues to stay active working out routinely, ambulates with cane, denies any recent falls.  Still having headaches occasionally but not as frequent. Denies new stroke/TIA symptoms. Routinely follows with PCP for stroke risk factor management.  Update 09/28/2022 JM: Patient returns for 60-month stroke follow-up.  He complains of increased right sided stiffness over the past month, also mentions pressure type headaches, frontal area, over the past 6 months.  Denies any changes in vision, denies photophobia or phonophobia.  Use of baclofen  5mg  TID but denies much benefit at current dosage. Received Botox by Dr. Carilyn back in October but did not f/u as no benefit.  He also complains of tinnitus, was seen by audiology and was told he has some hearing loss contributing to symptoms.  Denies new stroke/TIA symptoms.   Compliant on Crestor  and Plavix .  He is requesting Crestor  dosage to be lowered as he feels this is contributing to increased stiffness as this worsens in the evening time after taking his Crestor .  Currently on 20 mg daily.  Blood pressure well-controlled.  Loop recorder reached end of service 05/2022 without evidence of A-fib during monitoring, has this removed by Dr. Waddell back in December.  Completed sleep study which did not show any significant sleep apnea  requiring treatment.  Update 03/30/2022 JM: Patient returns for hospital follow-up.  He was evaluated in ED on 8/31 after presenting with left-sided headache lasting approximately 1 minute followed by intermittent left cheek numbness and LLE paresthesia lasting for approx 1 hour. Completed MRI brain which was negative for any findings. Per ED note, right sided deficits at baseline and no evidence of left-sided deficits.  No reoccurrence of left sided symptoms or headaches. No new stroke symptoms. Continued right-sided spasticity which he feels continues to worsen. Has been having more right sided hip pain, is considering doing PRP injection as this previously was beneficial.  Continued use of cane, denies any recent falls.  Continues to workout routinely.  Compliant on Crestor  and Plavix .  Blood pressure 120/75.  Loop recorder has not shown atrial fibrillation thus far.  Evaluated by Dr. Buck 7/27 with plans on pursuing sleep study currently scheduled on 9/25  No further concerns at this time  Update 01/04/2022 JM: Patient returns for acute visit after calling on 6/7 reporting worsening right arm and leg weakness lasting a few minutes prior to returning to baseline which occurred approx 3 weeks ago and requesting an MRI brain to rule out new stroke.  He did not seek any emergent evaluation at time or onset.  Denies any other associated symptoms such as headache, visual changes, dizziness or cognitive changes.  Reports since that time, he has had fluctuation of right leg heaviness sensation typically towards the end of the day or with increased activity.  He also notes fluctuation of speech over the past month.  Discussed concern of worsening spasticity at prior visit, referred to PT/OT for which he was evaluated for but did not have any additional sessions as advised.  He believes his spasticity has continued to worsen. He was seen by Dr. Carilyn 6/8 who referred him to OT for eval of Vivistim for  continued RUE deficits.  Was seen by PCP 6/7 and restarted on Prozac  for worsening depression.  Reports compliance on Plavix  and Crestor , denies side effects.  Blood pressure today 135/79.  Loop recorder has not shown atrial fibrillation thus far. He does note day time fatigue, not feeling refreshed upon awakening and having nocturia approx 5-6x per night.  Denies snoring, witnessed apneas or morning headaches.  No further concerns at this time.  Update 07/23/2021 JM: patient returns for 6 months stroke follow up. C/o right sided spasticity with slight increase in spasticity at times over the past couple of months, continues on baclofen  5 mg 3 times daily. Use of quad cane - no recent falls. Interested in doing additional therapy if able.  Dysarthria stable. No new stroke symptoms.  Compliant on Plavix  and Crestor .  Blood pressure today 142/84.  Loop recorder has not shown atrial fibrillation thus far.  No further concerns.  Update 01/15/2021 JM: Mr. Zajicek returns for 65-month stroke follow-up unaccompanied.  He has been stable from stroke standpoint without new stroke/TIA symptoms and residual right sided spasticity and dysarthria.  Continues to use quad cane and denies any recent falls.  Use of baclofen  5 mg 3 times daily with benefit.  Compliant on Plavix  without associated side effects.  He is concerned regarding possible statin myalgias as he will experience [name] pain with cramping shortly after taking atorvastatin .  Blood pressure today 137/81.  Loop recorder has not shown atrial fibrillation thus far.  Underwent right total shoulder arthroplasty 12/16/2020 by Dr. Addie without complication and recently started therapy.  No new concerns at this time.  Update 07/15/2020 JM: Mr. Deleonardis returns for 50-month stroke follow-up unaccompanied. 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 Has been using baclofen  for spasticity 5mg  three times daily but does experience increased stiffness upon  awakening and towards the end of the day. Feels as though speech has been improving. Denies new or worsening stroke/TIA symptoms. At prior visit, complained of right shoulder pain worsened post stroke. Evaluated by orthopedics with note personally reviewed and x-ray revealing severe end-stage OA with progression compared to prior imaging in 08/2018.  He has since received 2 injections with benefit.  Remains on Plavix  and atorvastatin  without side effects for secondary stroke prevention. Blood pressure today 127/77. Loop recorder has not shown atrial fibrillation thus far.  Previously on Prozac  for depression/anxiety post stroke but he has since been able to discontinue this without difficulty.  No further concerns at this time.  Update 01/10/2020 JM: Mr. Vana returns for follow-up regarding stroke in 08/2018.  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  Denies new stroke/TIA symptoms.  Currently ambulating with a quad cane and denies any recent falls.  Reports typically caring cane at his side just in case but he is here to walk without it.  Initiated baclofen  5 mg  twice daily as needed for spasticity.  Difficulty tolerating daytime dose due to increased fatigue has continued 5 mg dosage at night with only mild benefit.  He questions benefit of restarting Botox as he only received 2 rounds.  He does report right shoulder pain which is chronic but worsened post stroke.  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  Continues on clopidogrel  and atorvastatin  for secondary stroke prevention.  Prior lipid panel on 10/09/2019 by PCP showed LDL 51.  Blood pressure today 122/77.  Loop recorder is not shown atrial fibrillation thus far.  No further concerns at this  time.  Update 09/06/2019 JM: Mr. Gomillion is a 68 year old male who is being seen today for cryptogenic stroke follow-up.  He was previously seen via virtual visit on 11/08/2018 and has not previously followed up as recommended.  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  He continues to ambulate with a Rollator walker and denies any recent falls.  Greatest concern today is including continued right-sided spasticity.  Trial of Botox injections but denies benefit as well as trial of Zanaflex  but patient self discontinued due to reported side effect of upset stomach.  Completed 3 months DAPT and continues on Plavix  alone without bleeding or bruising.  Continues on atorvastatin  without myalgias.  Blood pressure today 116/78.  Continues to follow with PCP regularly for HTN and HLD management/monitoring.  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  Loop recorder has not shown atrial fibrillation thus far.  Denies new or worsening stroke/TIA symptoms.  Initial visit via virtual 11/08/2018: He has been stable from a stroke standpoint with residual deficits of aphasia, cognitive deficits and right hemiparesis but does endorse improvement.  He continues to participate at neuro rehab PT/OT/ST along with continuing exercises at home.  He is currently ambulating with a quad cane and denies any recent falls.  He does need some assistance with bathing and dressing but continues to be able to do more on his own each day.  He no longer has any difficulties with swallowing and is currently on a regular diet.  He does endorse increased salivation.  He continues on aspirin  and Plavix  without side effects of bleeding or bruising.  Continues on atorvastatin  without side effects myalgias.  Blood pressures not routinely monitored at home and encourage daughter and patient to obtain cough to start monitoring.  He does endorse occasional depression  difficulties such as feeling down or increased anxiety.  It was recommended to initiate Prozac  during hospital admission but states once he went home, he did not continue as he felt it was not needed.  Per review of loop recorder, no report of atrial fibrillation found but per patient and daughter, they were contacted by cardiology stating arrhythmia had been found and is in the process of scheduling visit with cardiology.  No further concerns at this time.  Denies new or worsening stroke/TIA symptoms.  Stroke admission 08/30/2018: Mr. Jeschke is a 68 year old male who presented to [facility] ED with right-sided weakness and aphasia.   CT head reviewed which showed age indeterminate small vessel ischemia in the left BG.  CTA showed possible left M1 occlusion versus severe stenosis.  CT perfusion no infarct for but large penumbra.  MRI  brain reviewed and showed left MCA patchy infarcts mostly concentrated in the left BG. He arrived greater than 24h from time since last well, therefore no acute stroke interventions done.  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  He unfortunately had neurological worsening with severe aphasia and dense right hemiplegia with extension of deficits on 09/01/2018.  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   When pts dtrs arrive, they both explain that in 2016 after tricep repair surgery they noted a possible arrhythmia and placed him on holter monitor for 30d, but was unrevealing.  2D echo showed an EF of 55 to 60%.  TCD bubble study negative for PFO.  Lower extremity venous Dopplers negative for DVT.  TEE showed small PFO/bubble crossover noted during Valsalva which was not felt to be clinically significant along with no evidence of cardiac thrombus therefore loop recorder placed.   Recommended DAPT for 3 months then Plavix  alone due to severe left M1 stenosis.  HTN  stable recommended long-term BP goal 1 30-1 50 due left M1 stenosis.  HLD 104 and initiated atorvastatin  40 mg daily.  Other stroke risk factors include family history of stroke but no personal history of stroke.  Other active problems including reactive depression as he previously worked as a Aeronautical engineer and overall healthy and was devastated with diagnosis and residual deficits.  Initiated Prozac  on 08/31/2018.  He had residual deficits of right hemiparesis, dysarthria and mixed aphasia and was discharged to CIR for ongoing therapy.      ROS:   14 system review of systems performed and negative with exception of those listed in HPI  PMH:  Past Medical History:  Diagnosis Date   Arthritis    right shoulder   Atypical chest pain 08/10/2013   BPH (benign prostatic hyperplasia)    COVID-19 07/2020   CVA (cerebral vascular accident) (HCC) 08/2018   spastic hemiparesis of right dominant side   Dysphagia    Elevated aspartate aminotransferase level 06/16/2015   Excessive salivation 06/16/2015   H/O nutritional disorder 06/16/2015   H/O vitamin D deficiency    History of loop recorder 2020   Hx of adenomatous colonic polyps 2016   Hyperlipidemia    Hypertension    Hypogonadism male 08/21/2014   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   Screening for prostate cancer 08/21/2014   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    PSH:  Past Surgical History:  Procedure Laterality Date   COLONOSCOPY     HERNIA REPAIR  2006   left tricep surgery     LOOP RECORDER INSERTION N/A 09/04/2018   Procedure: LOOP RECORDER INSERTION;  Surgeon: Waddell Danelle ORN, MD;  Location: [facility] INVASIVE CV LAB;  Service: Cardiovascular;  Laterality: N/A;   TEE WITHOUT CARDIOVERSION N/A 09/04/2018   Procedure: TRANSESOPHAGEAL ECHOCARDIOGRAM (TEE);  Surgeon: Jeffrie Oneil BROCKS, MD;  Location: [facility] ENDOSCOPY;  Service: Cardiovascular;  Laterality: N/A;   TOTAL SHOULDER ARTHROPLASTY Right 12/16/2020   Procedure: RIGHT TOTAL SHOULDER  ARTHROPLASTY;  Surgeon: Addie Cordella Hamilton, MD;  Location: [facility] OR;  Service: Orthopedics;  Laterality: Right;    Social History:  Social History   Socioeconomic History   Marital status: Divorced    Spouse name: Not on file   Number of children: 2   Years of education: Not on file   Highest education level: Not on file  Occupational History   Occupation: Systems analyst  Tobacco  Use   Smoking status: Never   Smokeless tobacco: Never  Vaping Use   Vaping status: Never Used  Substance and Sexual Activity   Alcohol use: No   Drug use: No   Sexual activity: Not on file  Other Topics Concern   Not on file  Social History Narrative   Pt lives with family    Pt works    Social Drivers of Corporate investment banker Strain: Low Risk  (09/19/2023)   Received from [facility]   Overall Financial Resource Strain (CARDIA)    Difficulty of Paying Living Expenses: Not hard at all  Food Insecurity: No Food Insecurity (12/10/2023)   Received from [facility]   Hunger Vital Sign    Within the past 12 months, you worried that your food would run out before you got the money to buy more.: Never true    Within the past 12 months, the food you bought just didn't last and you didn't have money to get more.: Never true  Transportation Needs: No Transportation Needs (12/10/2023)   Received from [facility] - Transportation    Lack of Transportation (Medical): No    Lack of Transportation (Non-Medical): No  Physical Activity: Inactive (12/10/2023)   [facility] from [facility]   Exercise Vital Sign    On average, how many days per week do you engage in moderate to strenuous exercise (like a brisk walk)?: 0 days    On average, how many minutes do you engage in exercise at this level?: 60 min  Stress: No Stress Concern Present (12/10/2023)   Received from [facility] - Occupational Stress Questionnaire    Feeling of Stress : Not at  all  Social Connections: Socially Integrated (12/10/2023)   Received from [facility]   Social Network    How would you rate your social network (family, work, friends)?: Good participation with social networks  Intimate Partner Violence: Not At Risk (12/10/2023)   Received from [facility]   HITS    Over the last 12 months how often did your partner physically hurt you?: Never    Over the last 12 months how often did your partner insult you or talk down to you?: Never    Over the last 12 months how often did your partner threaten you with physical harm?: Never    Over the last 12 months how often did your partner scream or curse at you?: Never    Family History:  Family History  Problem Relation Age of Onset   Kidney failure Mother    Cancer Mother    Prostate cancer Father    Cancer Father    Diabetes Father    Colon cancer Neg Hx    Esophageal cancer Neg Hx    Rectal cancer Neg Hx    Stomach cancer Neg Hx     Medications:   Current Outpatient Medications on File Prior to Visit  Medication Sig Dispense Refill   acetaminophen  (TYLENOL ) 325 MG tablet Take 2 tablets (650 mg total) by mouth every 4 (four) hours as needed for mild pain (or temp > 37.5 C (99.5 F)).     albuterol (VENTOLIN HFA) 108 (90 Base) MCG/ACT inhaler as needed for shortness of breath. (Patient not taking: Reported on 03/07/2024)     amLODipine  (NORVASC ) 5 MG tablet Take 5 mg by mouth daily.     baclofen  (LIORESAL ) 10 MG tablet Take 1 tablet (  10 mg total) by mouth 3 (three) times daily. 90 tablet 6   clopidogrel  (PLAVIX ) 75 MG tablet Take 1 tablet by mouth once daily 30 tablet 3   diclofenac  Sodium (VOLTAREN ) 1 % GEL Apply topically in the morning and at bedtime.     ezetimibe  (ZETIA ) 10 MG tablet Take 1 tablet (10 mg total) by mouth daily. 90 tablet 1   FLUoxetine  (PROZAC ) 20 MG capsule Take by mouth.     gabapentin  (NEURONTIN ) 300 MG capsule Take 1 capsule (300 mg total) by mouth at bedtime. (Patient not  taking: Reported on 03/07/2024) 30 capsule 11   Multiple Vitamin (MULTIVITAMIN PO) Take by mouth daily.     pravastatin  (PRAVACHOL ) 20 MG tablet Take 1 tablet (20 mg total) by mouth daily. 30 tablet 0   Current Facility-Administered Medications on File Prior to Visit  Medication Dose Route Frequency Provider Last Rate Last Admin   IncobotulinumtoxinA  SOLR 100 Units  100 Units Intramuscular Once Kirsteins, Prentice BRAVO, MD        Allergies:  No Known Allergies   Physical Exam  There were no vitals filed for this visit.  There is no height or weight on file to calculate BMI. No results found.  General: well developed, well nourished, pleasant middle-aged male, seated, in no evident distress Head: head normocephalic and atraumatic.   Neck: supple with no carotid or supraclavicular bruits Cardiovascular: regular rate and rhythm, no murmurs Musculoskeletal: limited bilateral shoulder ROM Skin:  no rash/petichiae Vascular:  Normal pulses all extremities   Neurologic Exam Mental Status: Awake and fully alert. Mild dysarthria with hypophonia. Oriented to place and time. Recent and remote memory intact. Attention span, concentration and fund of knowledge appropriate. Mood and affect appropriate.  Cranial Nerves: Pupils equal, briskly reactive to light. Extraocular movements full without nystagmus. Visual fields full to confrontation. Hearing intact. Facial sensation intact. Mild right lower facial weakness when smiling.  tongue, palate moves normally and symmetrically.  Motor: Difficulty fully testing strength due to poor effort possibly in setting of pain and giveaway weakness.  Noted very slight right upper and lower extremity weakness which is chronic and increased tone bilateral upper and lower extremity. Sensory.: intact to touch , pinprick , position and vibratory sensation.  Coordination: Rapid alternating movements slowed in all 4 extremities. Finger-to-nose and heel-to-shin performed  accurately on left side.  Unable to adequately assess right side due to increased tone Gait and Station: Arises from chair with mild difficulty d/t pain. Stance is normal. Gait demonstrates  slow hemiplegic gait and stiffened right leg with use of cane.  Tandem walking heel toe not attempted Reflexes: 2+ RUE and [name] and 1+ left side. Toes downgoing.        ASSESSMENT/PLAN: NORWIN ALEMAN is a 68 y.o. year old male here with left MCA patchy infarct secondary to left M1 occlusion with embolic pattern due to unclear etiology on 08/30/2018 and neurological worsening with extension of deficits on 09/01/2018.  ILR reached end of service 06/2022 s/p removal. No evidence of a fib during monitoring duration. Vascular risk factors include intracranial stenosis, HLD and HTN.  Sleep study negative for sleep apnea.  Prior concerns of worsening right sided deficits, MR brain negative for new/acute abnormalities.  Possible TIA vs complicated migraine 02/2022 after presenting to ED with headache preceded by transient left facial and leg numbness with work up remarkable.  Continues to complain of gradually worsening right sided spasticity, weakness and right sided headaches    Subjective  worsening poststroke deficits: Unclear cause of worsening symptoms, extensive workup including multiple labs have been benign and repeat MR brain imaging without evidence of acute abnormality As request during recent ED visit, will place order for EMG/NCV Continue baclofen  *** as needed Intolerant to gabapentin , denies benefit on tizanidine   Suspect component of rebound headache with daily ibuprofen use, advised to limit use to no more than 2-3 times per week Declines interest in PT, he was advised to call if he wishes to pursue    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   Continues to experience gradual worsening of spasticity.  Adjust tizanidine  as noted above Again,  discussed scheduling follow-up visit with Dr. Carilyn for further treatment options.  Received 1 round of botox and did not f/u as no benefit but advised typically takes a few sessions to see benefit No benefit with baclofen  Continue to stay active with routine exercise and use of cane at all times for fall prevention Continue clopidogrel  75 mg daily  and pravastatin  for secondary stroke prevention managed/prescribed by PCP S/p ILR removal 06/2022 - no evidence of A-fib during almost 4 yr monitoring  Discussed secondary stroke prevention measures and importance of close PCP follow-up for aggressive stroke risk factor management including BP goal<130/90, HLD with LDL goal<70 and DM with A1c.<7  Stroke labs 09/2022: LDL 55, A1c 5.7 - repeat lab work today     Recommend follow up with Dr. Rosemarie for further evaluation    CC: Edward Luke POUR, MD  I personally spent a total of *** minutes in the care of the patient today including {Time Based Coding:210964241}.   Edward Glass, AGNP-BC  [facility] [address] [place], [place] [address]  Phone [phone] Fax [phone] Note: This document was prepared with digital dictation and possible smart phrase technology. Any transcriptional errors that result from this process are unintentional.

## 2024-04-25 ENCOUNTER — Ambulatory Visit: Admitting: Adult Health

## 2024-04-25 ENCOUNTER — Encounter: Payer: Self-pay | Admitting: Adult Health

## 2024-04-25 VITALS — BP 112/80 | HR 63 | Ht 65.0 in | Wt 174.4 lb

## 2024-04-25 DIAGNOSIS — M62838 Other muscle spasm: Secondary | ICD-10-CM

## 2024-04-25 DIAGNOSIS — I69959 Hemiplegia and hemiparesis following unspecified cerebrovascular disease affecting unspecified side: Secondary | ICD-10-CM | POA: Diagnosis not present

## 2024-04-25 DIAGNOSIS — R202 Paresthesia of skin: Secondary | ICD-10-CM | POA: Diagnosis not present

## 2024-04-25 DIAGNOSIS — Z8673 Personal history of transient ischemic attack (TIA), and cerebral infarction without residual deficits: Secondary | ICD-10-CM

## 2024-04-25 DIAGNOSIS — M79602 Pain in left arm: Secondary | ICD-10-CM

## 2024-04-25 DIAGNOSIS — M542 Cervicalgia: Secondary | ICD-10-CM | POA: Diagnosis not present

## 2024-04-25 DIAGNOSIS — M79601 Pain in right arm: Secondary | ICD-10-CM

## 2024-04-25 NOTE — Patient Instructions (Addendum)
 Your Plan:  You will be scheduled to complete an EMG/NCV for further evaluation - based on these results, we may have to consider sending you to a movement specialist center for further evaluation   You will be called to complete an MRI of your cervical spine to ensure there is no never compression causing your symptoms   Try to take 1.5-2 tablets of baclofen  at night to see if this can give you daytime relief without causing drowsiness. You can take additional 0.5-1 tablet during the day if needed     Follow up will be determined after completion of EMG/NCV      Thank you for coming to see us  at [facility]. I hope we have been able to provide you high quality care today.  You may receive a patient satisfaction survey over the next few weeks. We would appreciate your feedback and comments so that we may continue to improve ourselves and the health of our patients.

## 2024-04-26 ENCOUNTER — Telehealth: Payer: Self-pay | Admitting: Adult Health

## 2024-04-26 NOTE — Telephone Encounter (Signed)
MRI order sent to [place] [phone]

## 2024-05-08 ENCOUNTER — Encounter: Payer: Self-pay | Admitting: Adult Health

## 2024-05-25 ENCOUNTER — Ambulatory Visit
Admission: RE | Admit: 2024-05-25 | Discharge: 2024-05-25 | Disposition: A | Discharge status: Home / Self Care | Source: Ambulatory Visit | Attending: Adult Health | Admitting: Adult Health

## 2024-05-25 DIAGNOSIS — M62838 Other muscle spasm: Secondary | ICD-10-CM

## 2024-05-25 DIAGNOSIS — M542 Cervicalgia: Secondary | ICD-10-CM | POA: Diagnosis not present

## 2024-05-25 DIAGNOSIS — I69959 Hemiplegia and hemiparesis following unspecified cerebrovascular disease affecting unspecified side: Secondary | ICD-10-CM

## 2024-05-25 DIAGNOSIS — R202 Paresthesia of skin: Secondary | ICD-10-CM | POA: Diagnosis not present

## 2024-05-25 DIAGNOSIS — M79601 Pain in right arm: Secondary | ICD-10-CM

## 2024-05-25 DIAGNOSIS — Z8673 Personal history of transient ischemic attack (TIA), and cerebral infarction without residual deficits: Secondary | ICD-10-CM

## 2024-05-25 MED ORDER — GADOPICLENOL 0.5 MMOL/ML IV SOLN
8.0000 mL | Freq: Once | INTRAVENOUS | Status: AC | PRN
  Administered 2024-05-25: 8 mL via INTRAVENOUS

## 2024-05-28 ENCOUNTER — Encounter: Payer: Self-pay | Admitting: Radiology

## 2024-05-29 ENCOUNTER — Ambulatory Visit: Payer: Self-pay | Admitting: Adult Health

## 2024-06-12 ENCOUNTER — Ambulatory Visit: Admitting: Adult Health

## 2024-06-15 ENCOUNTER — Ambulatory Visit: Admitting: Neurology

## 2024-06-15 ENCOUNTER — Encounter: Payer: Self-pay | Admitting: Neurology

## 2024-06-15 DIAGNOSIS — I69959 Hemiplegia and hemiparesis following unspecified cerebrovascular disease affecting unspecified side: Secondary | ICD-10-CM

## 2024-06-15 DIAGNOSIS — R202 Paresthesia of skin: Secondary | ICD-10-CM

## 2024-06-15 DIAGNOSIS — M62838 Other muscle spasm: Secondary | ICD-10-CM | POA: Diagnosis not present

## 2024-06-15 DIAGNOSIS — M79601 Pain in right arm: Secondary | ICD-10-CM

## 2024-06-15 DIAGNOSIS — M79602 Pain in left arm: Secondary | ICD-10-CM

## 2024-06-15 DIAGNOSIS — M542 Cervicalgia: Secondary | ICD-10-CM

## 2024-06-15 DIAGNOSIS — Z8673 Personal history of transient ischemic attack (TIA), and cerebral infarction without residual deficits: Secondary | ICD-10-CM

## 2024-06-15 NOTE — Procedures (Signed)
 Full Name: Andrei Mccook Gender: Male MRN #: [id number] Date of Birth: [birth year]    Visit Date: 06/15/2024 08:47 Age: 68 Years Examining Physician: Onita Referring Physician: Harlene Height: 5 feet 5 inch History: 68 year old male with history of left MCA stroke with residual spastic right hemiparesis, presenting with worsening left upper and lower extremity spasticity, tightness  Summary of the test: Nerve conduction study: Left sural, superficial peroneal sensory responses were normal.  Left median, ulnar, radial sensory responses were normal.  Left peroneal to EDB, tibial motor responses were normal.  Left median, ulnar motor responses were normal.  Electromyography:  Selected needle examination of left upper, lower extremity muscles, left lumbar, cervical paraspinal muscles were normal.  Conclusion: This is a normal study.  There is no electrodiagnostic evidence of left upper or lower extremity focal neuropathy    ------------------------------- Modena Onita. M.D. Ph.D.   [facility] [address], [address] [place], [place] [address] Tel: [phone] Fax: [phone]  Verbal informed consent was obtained from the patient, patient was informed of potential risk of procedure, including bruising, bleeding, hematoma formation, infection, muscle weakness, muscle pain, numbness, among others.        MNC    Nerve / Sites Muscle Latency Ref. Amplitude Ref. Rel Amp Segments Distance Velocity Ref. Area    ms ms mV mV %  cm m/s m/s mVms  L Median - APB     Wrist APB 4.4 <=4.4 5.3 >=4.0 100 Wrist - APB 7   18.8     Upper arm APB 8.5  5.3  99.9 Upper arm - Wrist 22 54 >=49 17.0  L Ulnar - ADM     Wrist ADM 2.7 <=3.3 11.5 >=6.0 100 Wrist - ADM 7   36.2     B.Elbow ADM 5.0  11.7  102 B.Elbow - Wrist 13 56 >=49 36.6     A.Elbow ADM 7.2  10.4  88.8 A.Elbow - B.Elbow 12 54 >=49 35.3  L Peroneal - EDB     Ankle EDB 4.4 <=6.5 3.0 >=2.0 100 Ankle - EDB 9    12.0     Fib head EDB 10.3  3.0  99.2 Fib head - Ankle 27 45 >=44 11.3     Pop fossa EDB 12.5  2.9  95 Pop fossa - Fib head 12 55 >=44 10.8         Pop fossa - Ankle      L Tibial - AH     Ankle AH 3.5 <=5.8 9.8 >=4.0 100 Ankle - AH 9   25.4     Pop fossa AH 12.3  7.2  73.8 Pop fossa - Ankle 42 47 >=41 24.1             SNC    Nerve / Sites Rec. Site Peak Lat Ref.  Amp Ref. Segments Distance    ms ms V V  cm  L Radial - Anatomical snuff box (Forearm)     Forearm Wrist 2.4 <=2.9 29 >=15 Forearm - Wrist 10  L Sural - Ankle (Calf)     Calf Ankle 3.7 <=4.4 6 >=6 Calf - Ankle 14  L Superficial peroneal - Ankle     Lat leg Ankle 3.9 <=4.4 7 >=6 Lat leg - Ankle 14  L Median - Orthodromic (Dig II, Mid palm)     Dig II Wrist 2.9 <=3.4 17 >=10 Dig II - Wrist 13  L Ulnar - Orthodromic, (Dig  V, Mid palm)     Dig V Wrist 2.7 <=3.1 8 >=5 Dig V - Wrist 20               F  Wave    Nerve F Lat Ref.   ms ms  L Ulnar - ADM 29.5 <=32.0  L Tibial - AH 50.5 <=56.0         EMG Summary Table    Spontaneous MUAP Recruitment  Muscle IA Fib PSW Fasc Other Amp Dur. Poly Pattern  L. Tibialis anterior Normal None None None _______ Normal Normal Normal Normal  L. Tibialis posterior Normal None None None _______ Normal Normal Normal Normal  L. Gastrocnemius (Medial head) Normal None None None _______ Normal Normal Normal Normal  L. Vastus lateralis Normal None None None _______ Normal Normal Normal Normal  L. Lumbar paraspinals (low) Normal None None None _______ Normal Normal Normal Normal  L. Lumbar paraspinals (mid) Normal None None None _______ Normal Normal Normal Normal  L. First dorsal interosseous Normal None None None _______ Normal Normal Normal Normal  L. Biceps brachii Normal None None None _______ Normal Normal Normal Normal  L. Deltoid Normal None None None _______ Normal Normal Normal Normal  L. Triceps brachii Normal None None None _______ Normal Normal Normal Normal  L. Extensor  digitorum communis Normal None None None _______ Normal Normal Normal Normal  R. Lumbar paraspinals (low) Normal None None None _______ Normal Normal Normal Normal  R. Lumbar paraspinals (mid) Normal None None None _______ Normal Normal Normal Normal

## 2024-06-15 NOTE — Progress Notes (Signed)
 Chief Complaint  Patient presents with   EMGRM4/NCS    Pt is here Alone.    ASSESSMENT AND PLAN  Edward Glass is a 68 y.o. male   Stroke with residual spastic right hemiparesis Worsening left upper and lower extremity stiffness  EMG nerve conduction study was normal, no evidence of left upper or lower extremity focal neuropathy  Continue current management, is on baclofen  10 mg 3 times a day, Plavix   DIAGNOSTIC DATA (LABS, IMAGING, TESTING) - I reviewed patient records, labs, notes, testing and imaging myself where available.   MEDICAL HISTORY:  Edward Glass is a 68 year old male, seen in request by nurse practitioner Harlene Bogaert for EMG nerve conduction study of left leg and upper extremity stiffness,  History is obtained from the patient and review of electronic medical records. I personally reviewed pertinent available imaging films in PACS.   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, few hours each day, over the past few months, he noticed increased left upper lower extremity spasticity with exertion, such as riding bikes  Had a repeat MRI of the brain March 27, 2024, no acute abnormality, remote infarction in the left basal ganglia and corona radiata  MRI of cervical spine with without contrast showed multiple degenerative changes, no significant canal stenosis, moderate foraminal narrowing at right C3-4  Lab in September 2025 normal CPK, TSH B12, A1c 5.6  EMG nerve conduction study June 15, 2024 of left upper and lower extremity were normal  PHYSICAL EXAM:   Vitals:   06/15/24 0828  BP: 119/78  Pulse: 64   PHYSICAL  EXAMNIATION:  Gen: NAD, conversant, well nourised, well groomed                     Cardiovascular: Regular rate rhythm, no peripheral edema, warm, nontender. Eyes: Conjunctivae clear without exudates or hemorrhage Neck: Supple, no carotid bruits. Pulmonary: Clear to auscultation bilaterally   NEUROLOGICAL EXAM:  MENTAL STATUS: Speech/cognition: Awake, alert, oriented to history taking and casual conversation CRANIAL NERVES: CN II: Visual fields are full to confrontation. Pupils are round equal and briskly reactive to light. CN III, IV, VI: extraocular movement are normal. No ptosis. CN V: Facial sensation is intact to light touch CN VII: Face is symmetric with normal eye closure  CN VIII: Hearing is normal to causal conversation. CN IX, X: Phonation is normal. CN XI: Head turning and shoulder shrug are intact  MOTOR: Spastic right hemiparesis, antigravity movement of right upper extremity, mild increase in right lower extremity muscle tone  REFLEXES: Hyperreflexia of right upper and lower extremity, right Babinski sign  SENSORY: Intact to light touch, pinprick and vibratory sensation are intact in fingers and toes.  COORDINATION: There is no trunk or limb dysmetria noted.  GAIT/STANCE: Push-up right Hemicircumferential gait, uses cane  REVIEW OF SYSTEMS:  Full 14 system review of systems performed and notable only for as above All other review of systems were negative.   ALLERGIES: No Known Allergies  HOME MEDICATIONS: Current Outpatient Medications  Medication Sig Dispense Refill   acetaminophen  (TYLENOL ) 325 MG tablet Take 2 tablets (650 mg total) by mouth every 4 (four) hours as needed for  mild pain (or temp > 37.5 C (99.5 F)).     amLODipine  (NORVASC ) 5 MG tablet Take 5 mg by mouth daily.     baclofen  (LIORESAL ) 10 MG tablet Take 1 tablet (10 mg total) by mouth 3 (three) times daily. 90 tablet 6   clopidogrel  (PLAVIX ) 75 MG tablet Take 1 tablet by mouth once daily  30 tablet 3   ezetimibe  (ZETIA ) 10 MG tablet Take 1 tablet (10 mg total) by mouth daily. 90 tablet 1   FLUoxetine  (PROZAC ) 20 MG capsule Take by mouth.     Multiple Vitamin (MULTIVITAMIN PO) Take by mouth daily.     albuterol (VENTOLIN HFA) 108 (90 Base) MCG/ACT inhaler as needed for shortness of breath. (Patient not taking: Reported on 06/15/2024)     diclofenac  Sodium (VOLTAREN ) 1 % GEL Apply topically in the morning and at bedtime. (Patient not taking: Reported on 06/15/2024)     pravastatin  (PRAVACHOL ) 20 MG tablet Take 1 tablet (20 mg total) by mouth daily. 30 tablet 0   No current facility-administered medications for this visit.    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   Screening for prostate cancer 08/21/2014   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    PAST SURGICAL HISTORY: Past Surgical History:  Procedure Laterality Date   COLONOSCOPY     HERNIA REPAIR  2006   left tricep surgery     LOOP RECORDER INSERTION N/A 09/04/2018   Procedure: LOOP RECORDER INSERTION;  Surgeon: Waddell Danelle ORN, MD;  Location: [facility] INVASIVE CV LAB;  Service: Cardiovascular;  Laterality: N/A;   TEE WITHOUT CARDIOVERSION N/A 09/04/2018   Procedure: TRANSESOPHAGEAL ECHOCARDIOGRAM (TEE);  Surgeon: Jeffrie Oneil BROCKS, MD;  Location: [facility] ENDOSCOPY;  Service: Cardiovascular;  Laterality: N/A;   TOTAL SHOULDER ARTHROPLASTY Right 12/16/2020   Procedure:  RIGHT TOTAL SHOULDER ARTHROPLASTY;  Surgeon: Addie Cordella Hamilton, MD;  Location: [facility] OR;  Service: Orthopedics;  Laterality: Right;    FAMILY HISTORY: Family History  Problem Relation Age of Onset   Kidney failure Mother    Cancer Mother    Prostate cancer Father    Cancer Father    Diabetes Father    Colon cancer Neg Hx    Esophageal cancer Neg Hx    Rectal cancer Neg Hx    Stomach cancer Neg Hx     SOCIAL HISTORY: Social History   Socioeconomic History   Marital status: Divorced    Spouse name: Not on file   Number of children: 2   Years of education: Not on file   Highest education level: Not on file  Occupational History   Occupation: Systems analyst  Tobacco Use   Smoking status: Never   Smokeless tobacco: Never  Vaping Use   Vaping status: Never Used  Substance and Sexual Activity   Alcohol use: No   Drug use: No   Sexual activity: Not on file  Other Topics Concern   Not on file  Social History Narrative   Pt lives with family    Pt works       No caffeine consumption    Social Drivers of Corporate Investment Banker Strain: Low Risk  (05/04/2024)   Received from [facility]   Overall Financial Resource Strain (CARDIA)    How hard is it for you to pay for the very basics like food, housing, medical care, and heating?: Not hard at all  Food Insecurity: No Food Insecurity (05/04/2024)   Received from [facility]   Hunger Vital Sign    Within the past 12 months, you worried that your food would run out before you got the money to buy more.: Never true    Within the past 12 months, the food you bought just didn't last and you didn't have money to get more.: Never true  Transportation Needs: No Transportation Needs (05/04/2024)   Received from [facility] - Transportation    In the past 12 months, has lack of transportation kept you from medical appointments or from getting medications?: No    In the past 12 months, has lack of  transportation kept you from meetings, work, or from getting things needed for daily living?: No  Physical Activity: Sufficiently Active (05/04/2024)   Received from [facility]   Exercise Vital Sign    On average, how many days per week do you engage in moderate to strenuous exercise (like a brisk walk)?: 5 days    On average, how many minutes do you engage in exercise at this level?: 60 min  Stress: No Stress Concern Present (05/04/2024)   Received from [facility] - Occupational Stress Questionnaire    Do you feel stress - tense, restless, nervous, or anxious, or unable to sleep at night because your mind is troubled all the time - these days?: Only a little  Social Connections: Socially Integrated (05/04/2024)   Received from [facility]   Social Network    How would you rate your social network (family, work, friends)?: Good participation with social networks  Intimate Partner Violence: Not At Risk (05/04/2024)   Received from [facility]   HITS    Over the last 12 months how often did your partner physically hurt you?: Never    Over the last 12 months how often did your partner insult you or talk down to you?: Never    Over the last 12 months how often did your partner threaten you with physical harm?: Never    Over the last 12 months how often did your partner scream or curse at you?: Never      Modena Callander, M.D. Ph.D.  [facility] [address] [place], [place] [address] Ph: [phone] Fax: [phone]  CC:  Rena Luke POUR, MD [address] [place],  [place] [address]  Rena Luke POUR, MD

## 2024-08-26 ENCOUNTER — Telehealth: Payer: Self-pay

## 2024-08-26 NOTE — Telephone Encounter (Signed)
 Called patient to r/s appt for 08/27/24.  Scheduled for Dr Carliss first available on 03/18 but patient is requesting sooner appt. Aware we will not call him back about this before Wednesday.

## 2024-08-27 ENCOUNTER — Ambulatory Visit: Admitting: Orthopedic Surgery

## 2024-08-28 NOTE — Telephone Encounter (Signed)
 I called and advised pt, and he will be here

## 2024-09-12 ENCOUNTER — Ambulatory Visit: Admitting: Orthopedic Surgery

## 2024-10-10 ENCOUNTER — Ambulatory Visit: Admitting: Orthopedic Surgery
